# Patient Record
Sex: Female | Born: 1960
Health system: Southern US, Community
[De-identification: ages and names within clinical notes are randomized; demographics above are authoritative.]

## PROBLEM LIST (undated history)

## (undated) DIAGNOSIS — I639 Cerebral infarction, unspecified: Secondary | ICD-10-CM

## (undated) DIAGNOSIS — R251 Tremor, unspecified: Secondary | ICD-10-CM

## (undated) DIAGNOSIS — R011 Cardiac murmur, unspecified: Secondary | ICD-10-CM

## (undated) DIAGNOSIS — M1990 Unspecified osteoarthritis, unspecified site: Secondary | ICD-10-CM

## (undated) DIAGNOSIS — G8929 Other chronic pain: Secondary | ICD-10-CM

## (undated) DIAGNOSIS — I10 Essential (primary) hypertension: Secondary | ICD-10-CM

## (undated) DIAGNOSIS — I340 Nonrheumatic mitral (valve) insufficiency: Secondary | ICD-10-CM

## (undated) DIAGNOSIS — R51 Headache: Secondary | ICD-10-CM

## (undated) DIAGNOSIS — R569 Unspecified convulsions: Secondary | ICD-10-CM

## (undated) DIAGNOSIS — R519 Headache, unspecified: Secondary | ICD-10-CM

## (undated) DIAGNOSIS — R7303 Prediabetes: Secondary | ICD-10-CM

## (undated) HISTORY — DX: Cardiac murmur, unspecified: R01.1

## (undated) HISTORY — DX: Nonrheumatic mitral (valve) insufficiency: I34.0

## (undated) HISTORY — PX: POLYPECTOMY: SHX149

## (undated) HISTORY — DX: Cerebral infarction, unspecified: I63.9

## (undated) HISTORY — PX: OTHER SURGICAL HISTORY: SHX169

## (undated) HISTORY — PX: COLONOSCOPY: SHX174

---

## 1999-02-24 ENCOUNTER — Other Ambulatory Visit: Admission: RE | Admit: 1999-02-24 | Discharge: 1999-02-24 | Payer: Self-pay | Admitting: Emergency Medicine

## 1999-03-30 ENCOUNTER — Encounter: Payer: Self-pay | Admitting: Family Medicine

## 1999-03-30 ENCOUNTER — Ambulatory Visit (HOSPITAL_COMMUNITY): Admission: RE | Admit: 1999-03-30 | Discharge: 1999-03-30 | Payer: Self-pay | Admitting: Family Medicine

## 1999-09-03 ENCOUNTER — Emergency Department (HOSPITAL_COMMUNITY): Admission: EM | Admit: 1999-09-03 | Discharge: 1999-09-03 | Payer: Self-pay | Admitting: Emergency Medicine

## 1999-09-07 ENCOUNTER — Emergency Department (HOSPITAL_COMMUNITY): Admission: EM | Admit: 1999-09-07 | Discharge: 1999-09-07 | Payer: Self-pay | Admitting: Emergency Medicine

## 1999-09-07 ENCOUNTER — Encounter: Payer: Self-pay | Admitting: Emergency Medicine

## 2000-11-26 ENCOUNTER — Other Ambulatory Visit: Admission: RE | Admit: 2000-11-26 | Discharge: 2000-11-26 | Payer: Self-pay | Admitting: Family Medicine

## 2000-12-11 ENCOUNTER — Ambulatory Visit (HOSPITAL_COMMUNITY): Admission: RE | Admit: 2000-12-11 | Discharge: 2000-12-11 | Payer: Self-pay | Admitting: Family Medicine

## 2000-12-11 ENCOUNTER — Encounter: Payer: Self-pay | Admitting: Family Medicine

## 2004-04-29 ENCOUNTER — Encounter: Admission: RE | Admit: 2004-04-29 | Discharge: 2004-04-29 | Payer: Self-pay | Admitting: Family Medicine

## 2004-05-04 ENCOUNTER — Other Ambulatory Visit: Admission: RE | Admit: 2004-05-04 | Discharge: 2004-05-04 | Payer: Self-pay | Admitting: Family Medicine

## 2009-02-24 ENCOUNTER — Encounter (INDEPENDENT_AMBULATORY_CARE_PROVIDER_SITE_OTHER): Payer: Self-pay | Admitting: *Deleted

## 2009-02-24 DIAGNOSIS — I10 Essential (primary) hypertension: Secondary | ICD-10-CM | POA: Insufficient documentation

## 2009-03-17 ENCOUNTER — Encounter: Payer: Self-pay | Admitting: *Deleted

## 2009-06-09 ENCOUNTER — Encounter: Admission: RE | Admit: 2009-06-09 | Discharge: 2009-06-09 | Payer: Self-pay | Admitting: Cardiology

## 2011-12-23 ENCOUNTER — Encounter (HOSPITAL_COMMUNITY): Payer: Self-pay | Admitting: *Deleted

## 2011-12-23 ENCOUNTER — Other Ambulatory Visit: Payer: Self-pay

## 2011-12-23 ENCOUNTER — Emergency Department (HOSPITAL_COMMUNITY): Payer: Self-pay

## 2011-12-23 ENCOUNTER — Emergency Department (HOSPITAL_COMMUNITY)
Admission: EM | Admit: 2011-12-23 | Discharge: 2011-12-23 | Disposition: A | Discharge status: Home Health | Payer: Self-pay | Attending: Emergency Medicine | Admitting: Emergency Medicine

## 2011-12-23 DIAGNOSIS — R51 Headache: Secondary | ICD-10-CM | POA: Insufficient documentation

## 2011-12-23 DIAGNOSIS — I10 Essential (primary) hypertension: Secondary | ICD-10-CM

## 2011-12-23 DIAGNOSIS — E669 Obesity, unspecified: Secondary | ICD-10-CM

## 2011-12-23 HISTORY — DX: Essential (primary) hypertension: I10

## 2011-12-23 LAB — POCT I-STAT, CHEM 8
BUN: 8 mg/dL (ref 6–23)
Calcium, Ion: 1.2 mmol/L (ref 1.12–1.32)
Hemoglobin: 11.9 g/dL — ABNORMAL LOW (ref 12.0–15.0)
Sodium: 141 mEq/L (ref 135–145)
TCO2: 27 mmol/L (ref 0–100)

## 2011-12-23 MED ORDER — LISINOPRIL 20 MG PO TABS
20.0000 mg | ORAL_TABLET | Freq: Once | ORAL | Status: AC
  Administered 2011-12-23: 20 mg via ORAL
  Filled 2011-12-23: qty 1

## 2011-12-23 MED ORDER — LISINOPRIL 20 MG PO TABS: 20.0000 mg | ORAL_TABLET | Freq: Every day | ORAL | Status: DC

## 2011-12-23 NOTE — ED Provider Notes (Signed)
History     CSN: [id number]  Arrival date & time 12/23/11  1253   First MD Initiated Contact with Patient 12/23/11 1301      Chief Complaint  Patient presents with  . Headache  . Hypertension    (Consider location/radiation/quality/duration/timing/severity/associated sxs/prior treatment) HPI  Patient presents to emergency department complaining of high blood pressure and headache. Patient states that due to job loss she has been out of her blood pressure medication, losartan, for over a month now and is concerned that after filling her last 2 refills the price of her low starting was costing her approximately $75 out of pocket due to no insurance. Patient states that she woke yesterday morning with a headache that gradually worsened throughout the day and therefore she went to [facility] and have her blood pressure checked noting that her blood pressure was approximately 180/100. Patient states she took Tylenol x3 doses throughout the day with improvement of the headache yesterday but states that once again she woke this morning with headache and went to [facility] noticed her blood pressure was elevated and therefore came to emergency department for further evaluation. Patient denies associated fevers, chills, dizziness, visual changes, neck stiffness, chest pain, abdominal pain, shortness of breath, changes in her urination. Patient states she walks steps throughout the day without any exertional chest pain or shortness of breath. Patient states the headache is gradual onset but does improve after Tylenol use. She denies any loss of coordination or difficulty ambulating. She denies extremity numbness/tingling/or weakness.  Past Medical History  Diagnosis Date  . Hypertension     History reviewed. No pertinent past surgical history.  History reviewed. No pertinent family history.  History  Substance Use Topics  . Smoking status: Not on file  . Smokeless tobacco: Not on file  .  Alcohol Use: No    OB History    Grav Para Term Preterm Abortions TAB SAB Ect Mult Living                  Review of Systems  All other systems reviewed and are negative.    Allergies  Review of patient's allergies indicates no known allergies.  Home Medications  No current outpatient prescriptions on file.  BP 206/107  Pulse 87  Temp(Src) 98.1 F (36.7 C) (Oral)  Resp 20  SpO2 97%  LMP 11/24/2011  Physical Exam  Nursing note and vitals reviewed. Constitutional: She is oriented to person, place, and time. She appears well-developed and well-nourished. No distress.  HENT:  Head: Normocephalic and atraumatic.  Eyes: Conjunctivae and EOM are normal. Pupils are equal, round, and reactive to light.  Neck: Normal range of motion. Neck supple.  Cardiovascular: Normal rate, regular rhythm, normal heart sounds and intact distal pulses.  Exam reveals no gallop and no friction rub.   No murmur heard. Pulmonary/Chest: Effort normal and breath sounds normal. No respiratory distress. She has no wheezes. She has no rales. She exhibits no tenderness.  Abdominal: Soft. Bowel sounds are normal. She exhibits no distension and no mass. There is no tenderness. There is no rebound and no guarding.  Musculoskeletal: Normal range of motion. She exhibits no edema and no tenderness.  Neurological: She is alert and oriented to person, place, and time. No cranial nerve deficit. Coordination normal.  Skin: Skin is warm and dry. No rash noted. She is not diaphoretic. No erythema.  Psychiatric: She has a normal mood and affect.    ED Course  Procedures (including critical care  time)  1:41 PM PO lisinopril with most recent BP on monitor 176/98   Date: 12/23/2011  Rate: 66  Rhythm: normal sinus rhythm  QRS Axis: normal  Intervals: normal  ST/T Wave abnormalities: non specific T wave changes/flattening  Conduction Disutrbances: none  Narrative Interpretation:   Old EKG Reviewed: non  provocative EKG compared to Sep 07, 1999 No significant changes noted     Labs Reviewed - No data to display Ct Head Wo Contrast  12/23/2011  *RADIOLOGY REPORT*  Clinical Data: 51 year old female with headache and hypertension.  CT HEAD WITHOUT CONTRAST  Technique:  Contiguous axial images were obtained from the base of the skull through the vertex without contrast.  Comparison: None  Findings: No acute intracranial abnormalities are identified, including mass lesion or mass effect, hydrocephalus, extra-axial fluid collection, midline shift, hemorrhage, or acute infarction.  The visualized bony calvarium is unremarkable.  IMPRESSION: No evidence of acute intracranial abnormality.  Original Report Authenticated By: Rosendo Gros, M.D.     1. HYPERTENSION   2. Obesity       MDM  Improved BP in ER before given lisinopril with systolic below 190 after initial triage BP. Patient's HA has improved with no other neurofocal findings and no acute findings on CT scan. Patient has no other worrisome signs or symptoms of end organ damage. No acute findings on istat chem 8. We will change her BP medication to lisinopril 20mg  daily which is on [facility] 4 dollar list and give multiple referrals for PCP follow up.         Jenness [place], [place] 12/23/11 1429

## 2011-12-23 NOTE — Discharge Instructions (Signed)
Establishment with a Primary Care provider is Very important for general health care concerns, minor illness and minor injury and for ongoing evaluation and management of your high blood pressure but return to ER for emergent changing or worsening of symptoms.   Hypertension Information As your heart beats, it forces blood through your arteries. This force is your blood pressure. If the pressure is too high, it is called hypertension (HTN) or high blood pressure. HTN is dangerous because you may have it and not know it. High blood pressure may mean that your heart has to work harder to pump blood. Your arteries may be narrow or stiff. The extra work puts you at risk for heart disease, stroke, and other problems.  Blood pressure consists of two numbers, a higher number over a lower, 110/72, for example. It is stated as "110 over 72." The ideal is below 120 for the top number (systolic) and under 80 for the bottom (diastolic).  You should pay close attention to your blood pressure if you have certain conditions such as:  Heart failure.   Prior heart attack.   Diabetes   Chronic kidney disease.   Prior stroke.   Multiple risk factors for heart disease.  To see if you have HTN, your blood pressure should be measured while you are seated with your arm held at the level of the heart. It should be measured at least twice. A one-time elevated blood pressure reading (especially in the Emergency Department) does not mean that you need treatment. There may be conditions in which the blood pressure is different between your right and left arms. It is important to see your caregiver soon for a recheck. Most people have essential hypertension which means that there is not a specific cause. This type of high blood pressure may be lowered by changing lifestyle factors such as:  Stress.   Smoking.   Lack of exercise.   Excessive weight.   Drug/tobacco/alcohol use.   Eating less salt.  Most people do  not have symptoms from high blood pressure until it has caused damage to the body. Effective treatment can often prevent, delay or reduce that damage. TREATMENT  Treatment for high blood pressure, when a cause has been identified, is directed at the cause. There are a large number of medications to treat HTN. These fall into several categories, and your caregiver will help you select the medicines that are best for you. Medications may have side effects. You should review side effects with your caregiver. If your blood pressure stays high after you have made lifestyle changes or started on medicines,   Your medication(s) may need to be changed.   Other problems may need to be addressed.   Be certain you understand your prescriptions, and know how and when to take your medicine.   Be sure to follow up with your caregiver within the time frame advised (usually within two weeks) to have your blood pressure rechecked and to review your medications.   If you are taking more than one medicine to lower your blood pressure, make sure you know how and at what times they should be taken. Taking two medicines at the same time can result in blood pressure that is too low.  Document Released: 12/19/2005 Document Revised: 06/28/2011 Document Reviewed: 12/26/2007 [facility] Patient Information 2012 [place], [place].

## 2011-12-23 NOTE — ED Notes (Signed)
Pt reports being out of bp meds x 1 month, having frequent headaches, bp 224/122 at triage.

## 2011-12-24 NOTE — ED Provider Notes (Signed)
Medical screening examination/treatment/procedure(s) were performed by non-physician practitioner and as supervising physician I was immediately available for consultation/collaboration.   Jon Kasparek A. Patrica Duel, MD 12/24/11 [phone]

## 2013-07-05 DIAGNOSIS — I639 Cerebral infarction, unspecified: Secondary | ICD-10-CM

## 2013-07-05 HISTORY — DX: Cerebral infarction, unspecified: I63.9

## 2013-07-06 ENCOUNTER — Emergency Department (HOSPITAL_COMMUNITY): Payer: Self-pay

## 2013-07-06 ENCOUNTER — Inpatient Hospital Stay (HOSPITAL_COMMUNITY)
Admission: EM | Admit: 2013-07-06 | Discharge: 2013-07-21 | DRG: 023 | Disposition: A | Discharge status: Rehabilitation Facility | Payer: Self-pay | Attending: Family Medicine | Admitting: Family Medicine

## 2013-07-06 ENCOUNTER — Encounter (HOSPITAL_COMMUNITY): Payer: Self-pay | Admitting: *Deleted

## 2013-07-06 DIAGNOSIS — E785 Hyperlipidemia, unspecified: Secondary | ICD-10-CM | POA: Diagnosis present

## 2013-07-06 DIAGNOSIS — Z9119 Patient's noncompliance with other medical treatment and regimen: Secondary | ICD-10-CM

## 2013-07-06 DIAGNOSIS — Z6841 Body Mass Index (BMI) 40.0 and over, adult: Secondary | ICD-10-CM

## 2013-07-06 DIAGNOSIS — E876 Hypokalemia: Secondary | ICD-10-CM | POA: Diagnosis not present

## 2013-07-06 DIAGNOSIS — I619 Nontraumatic intracerebral hemorrhage, unspecified: Principal | ICD-10-CM | POA: Diagnosis present

## 2013-07-06 DIAGNOSIS — G919 Hydrocephalus, unspecified: Secondary | ICD-10-CM

## 2013-07-06 DIAGNOSIS — R4182 Altered mental status, unspecified: Secondary | ICD-10-CM | POA: Diagnosis present

## 2013-07-06 DIAGNOSIS — E669 Obesity, unspecified: Secondary | ICD-10-CM | POA: Diagnosis present

## 2013-07-06 DIAGNOSIS — I161 Hypertensive emergency: Secondary | ICD-10-CM | POA: Diagnosis present

## 2013-07-06 DIAGNOSIS — N170 Acute kidney failure with tubular necrosis: Secondary | ICD-10-CM | POA: Diagnosis not present

## 2013-07-06 DIAGNOSIS — G911 Obstructive hydrocephalus: Secondary | ICD-10-CM | POA: Diagnosis present

## 2013-07-06 DIAGNOSIS — E870 Hyperosmolality and hypernatremia: Secondary | ICD-10-CM | POA: Diagnosis not present

## 2013-07-06 DIAGNOSIS — I10 Essential (primary) hypertension: Secondary | ICD-10-CM | POA: Diagnosis present

## 2013-07-06 DIAGNOSIS — G936 Cerebral edema: Secondary | ICD-10-CM | POA: Diagnosis present

## 2013-07-06 DIAGNOSIS — Z91199 Patient's noncompliance with other medical treatment and regimen due to unspecified reason: Secondary | ICD-10-CM

## 2013-07-06 LAB — LIPID PANEL
HDL: 45 mg/dL (ref >39)
LDL Cholesterol: 168 mg/dL — ABNORMAL HIGH (ref 0–99)
Total CHOL/HDL Ratio: 5.4 RATIO
Triglycerides: 153 mg/dL — ABNORMAL HIGH (ref <150)
VLDL: 31 mg/dL (ref 0–40)

## 2013-07-06 LAB — BASIC METABOLIC PANEL
CO2: 26 mEq/L (ref 19–32)
CO2: 23 mEq/L (ref 19–32)
Calcium: 8.9 mg/dL (ref 8.4–10.5)
Calcium: 9.4 mg/dL (ref 8.4–10.5)
Chloride: 101 mEq/L (ref 96–112)
Chloride: 97 mEq/L (ref 96–112)
Creatinine, Ser: 0.69 mg/dL (ref 0.50–1.10)
Creatinine, Ser: 0.61 mg/dL (ref 0.50–1.10)
GFR calc Af Amer: >90 mL/min (ref >90)
GFR calc Af Amer: >90 mL/min (ref >90)
Potassium: 3.4 mEq/L — ABNORMAL LOW (ref 3.5–5.1)
Sodium: 138 mEq/L (ref 135–145)

## 2013-07-06 LAB — URINALYSIS, ROUTINE W REFLEX MICROSCOPIC
Bilirubin Urine: NEGATIVE
Ketones, ur: NEGATIVE mg/dL
Nitrite: NEGATIVE
Protein, ur: NEGATIVE mg/dL
pH: 8 (ref 5.0–8.0)

## 2013-07-06 LAB — CBC
MCH: 28.3 pg (ref 26.0–34.0)
MCV: 83.8 fL (ref 78.0–100.0)
Platelets: 215 10*3/uL (ref 150–400)
RBC: 4.63 MIL/uL (ref 3.87–5.11)
RDW: 15.6 % — ABNORMAL HIGH (ref 11.5–15.5)
WBC: 9.1 10*3/uL (ref 4.0–10.5)

## 2013-07-06 LAB — PROTIME-INR
INR: 0.95 (ref 0.00–1.49)
Prothrombin Time: 12.5 seconds (ref 11.6–15.2)

## 2013-07-06 MED ORDER — POTASSIUM CHLORIDE 10 MEQ/100ML IV SOLN
10.0000 meq | INTRAVENOUS | Status: AC
  Administered 2013-07-06 (×4): 10 meq via INTRAVENOUS
  Filled 2013-07-06 (×4): qty 100

## 2013-07-06 MED ORDER — CEFAZOLIN SODIUM-DEXTROSE 2-3 GM-% IV SOLR
2.0000 g | Freq: Three times a day (TID) | INTRAVENOUS | Status: DC
  Administered 2013-07-06 – 2013-07-11 (×17): 2 g via INTRAVENOUS
  Filled 2013-07-06 (×18): qty 50

## 2013-07-06 MED ORDER — ACETAMINOPHEN 650 MG RE SUPP
650.0000 mg | RECTAL | Status: DC | PRN
  Administered 2013-07-06 (×2): 650 mg via RECTAL
  Filled 2013-07-06 (×2): qty 1

## 2013-07-06 MED ORDER — ONDANSETRON HCL 4 MG/2ML IJ SOLN
4.0000 mg | Freq: Once | INTRAMUSCULAR | Status: AC
  Administered 2013-07-06: 4 mg via INTRAVENOUS
  Filled 2013-07-06: qty 2

## 2013-07-06 MED ORDER — LORAZEPAM 2 MG/ML IJ SOLN
1.0000 mg | INTRAMUSCULAR | Status: DC | PRN
  Administered 2013-07-06 – 2013-07-08 (×8): 1 mg via INTRAVENOUS
  Filled 2013-07-06 (×7): qty 1

## 2013-07-06 MED ORDER — ONDANSETRON HCL 4 MG/2ML IJ SOLN
4.0000 mg | Freq: Four times a day (QID) | INTRAMUSCULAR | Status: DC | PRN
  Administered 2013-07-06 – 2013-07-14 (×3): 4 mg via INTRAVENOUS
  Filled 2013-07-06 (×3): qty 2

## 2013-07-06 MED ORDER — FENTANYL CITRATE 0.05 MG/ML IJ SOLN
50.0000 ug | Freq: Once | INTRAMUSCULAR | Status: AC
  Administered 2013-07-06: 50 ug via INTRAVENOUS

## 2013-07-06 MED ORDER — MIDAZOLAM HCL 2 MG/2ML IJ SOLN
1.0000 mg | Freq: Once | INTRAMUSCULAR | Status: AC
  Administered 2013-07-06: 1 mg via INTRAVENOUS

## 2013-07-06 MED ORDER — ACETAMINOPHEN 325 MG PO TABS
650.0000 mg | ORAL_TABLET | ORAL | Status: DC | PRN
  Administered 2013-07-14 – 2013-07-21 (×11): 650 mg via ORAL
  Filled 2013-07-06 (×12): qty 2

## 2013-07-06 MED ORDER — MORPHINE SULFATE 2 MG/ML IJ SOLN
2.0000 mg | Freq: Once | INTRAMUSCULAR | Status: AC
  Administered 2013-07-06: 2 mg via INTRAVENOUS
  Filled 2013-07-06: qty 1

## 2013-07-06 MED ORDER — VALPROATE SODIUM 500 MG/5ML IV SOLN
500.0000 mg | Freq: Three times a day (TID) | INTRAVENOUS | Status: DC
  Administered 2013-07-06 – 2013-07-11 (×14): 500 mg via INTRAVENOUS
  Filled 2013-07-06 (×16): qty 5

## 2013-07-06 MED ORDER — FENTANYL CITRATE 0.05 MG/ML IJ SOLN
INTRAMUSCULAR | Status: AC
  Filled 2013-07-06: qty 2

## 2013-07-06 MED ORDER — LORAZEPAM 2 MG/ML IJ SOLN
INTRAMUSCULAR | Status: AC
  Filled 2013-07-06: qty 1

## 2013-07-06 MED ORDER — MIDAZOLAM HCL 2 MG/2ML IJ SOLN
1.0000 mg | Freq: Once | INTRAMUSCULAR | Status: AC
  Administered 2013-07-06: 1 mg via INTRAVENOUS
  Filled 2013-07-06: qty 2

## 2013-07-06 MED ORDER — SENNOSIDES-DOCUSATE SODIUM 8.6-50 MG PO TABS
1.0000 | ORAL_TABLET | Freq: Two times a day (BID) | ORAL | Status: DC
  Administered 2013-07-09 – 2013-07-21 (×21): 1 via ORAL
  Filled 2013-07-06 (×30): qty 1

## 2013-07-06 MED ORDER — VALPROATE SODIUM 500 MG/5ML IV SOLN
1000.0000 mg | Freq: Once | INTRAVENOUS | Status: AC
  Administered 2013-07-06: 1000 mg via INTRAVENOUS
  Filled 2013-07-06: qty 10

## 2013-07-06 MED ORDER — NICARDIPINE HCL IN NACL 20-0.86 MG/200ML-% IV SOLN
5.0000 mg/h | INTRAVENOUS | Status: DC
  Administered 2013-07-06: 6 mg/h via INTRAVENOUS
  Administered 2013-07-06: 5 mg/h via INTRAVENOUS
  Administered 2013-07-06: 7.5 mg/h via INTRAVENOUS
  Administered 2013-07-06: 5 mg/h via INTRAVENOUS
  Administered 2013-07-06 (×2): 10 mg/h via INTRAVENOUS
  Administered 2013-07-06: 4 mg/h via INTRAVENOUS
  Administered 2013-07-06: 14 mg/h via INTRAVENOUS
  Administered 2013-07-06: 12 mg/h via INTRAVENOUS
  Administered 2013-07-07: 12.5 mg/h via INTRAVENOUS
  Administered 2013-07-07: 2.5 mg/h via INTRAVENOUS
  Administered 2013-07-07: 10 mg/h via INTRAVENOUS
  Administered 2013-07-07: 5 mg/h via INTRAVENOUS
  Filled 2013-07-06 (×10): qty 200

## 2013-07-06 MED ORDER — PANTOPRAZOLE SODIUM 40 MG IV SOLR
40.0000 mg | Freq: Every day | INTRAVENOUS | Status: DC
  Administered 2013-07-06 – 2013-07-09 (×4): 40 mg via INTRAVENOUS
  Filled 2013-07-06 (×6): qty 40

## 2013-07-06 MED ORDER — NICARDIPINE HCL IN NACL 20-0.86 MG/200ML-% IV SOLN
5.0000 mg/h | Freq: Once | INTRAVENOUS | Status: AC
  Administered 2013-07-06: 5 mg/h via INTRAVENOUS
  Filled 2013-07-06: qty 200

## 2013-07-06 MED ORDER — LABETALOL HCL 5 MG/ML IV SOLN: 10.0000 mg | INTRAVENOUS | Status: DC | PRN

## 2013-07-06 MED ORDER — MORPHINE SULFATE 4 MG/ML IJ SOLN
4.0000 mg | Freq: Once | INTRAMUSCULAR | Status: AC
  Administered 2013-07-06: 4 mg via INTRAVENOUS
  Filled 2013-07-06: qty 1

## 2013-07-06 MED ORDER — LABETALOL HCL 5 MG/ML IV SOLN
20.0000 mg | Freq: Once | INTRAVENOUS | Status: AC
  Administered 2013-07-06: 20 mg via INTRAVENOUS
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
## 2013-07-06 NOTE — Progress Notes (Signed)
Dr Lovell Sheehan at bedside to flush IVC drain. Sterile saline given, pt tolerated procedure well. Dr Lovell Sheehan stated that drain will likely clot again and to monitor. IVC pulsating when he left but no new drainage. Pt with some confusion, forgetfulness and agitation. Dr Lovell Sheehan stated to wait until tomorrow to attempt MRI due to pt LOC, agitation and IVC. Will continue to monitor.
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
## 2013-07-06 NOTE — Progress Notes (Signed)
Chaplain was asked to speak with the patients family while in the ED. Chaplain provided a ministry of presence to the patients daughters and sisters. Chaplain provided a listenin  07/06/13 0230  Clinical Encounter Type  Visited With Family  Visit Type Initial;Spiritual support;Social support;ED  Referral From Physician  Spiritual Encounters  Spiritual Needs Emotional  Stress Factors  Patient Stress Factors None identified  Family Stress Factors Health changes;Major life changes  g and emphatic ear as the family described how the patient was fine the day before, but started experiencing headaches the following day. Chaplain offered prayer and emotional support.

## 2013-07-06 NOTE — Progress Notes (Signed)
*  PRELIMINARY RESULTS* Vascular Ultrasound Carotid Duplex (Doppler) has been completed.   Study was technically limited due to poor patient cooperation, and high bifurcation. Findings suggest 1-39% internal carotid artery stenosis bilaterally. Vertebral arteries are patent with antegrade flow.  07/06/2013 10:13 AM Gertie Fey, RVT, RDCS, RDMS

## 2013-07-06 NOTE — Op Note (Signed)
Brief history: The patient is a 52 year old black female with a history of hypertension who presented with decreased mental status. A head CT demonstrated a right intracerebral hemorrhage with interventricular hemorrhage and hydrocephalus. I discussed situation with the patient's family. Recommend placement of a ventriculostomy. I explained the procedure and we discussed the risks, benefits, alternatives, and likelihood of achieving our goals with surgery. I've answered all her questions. They have consented on behalf of the patient for placement of a ventriculostomy.  Preop diagnosis: Right intracerebral hemorrhage, intraventricular hemorrhage, hydrocephalus  Postop diagnosis: The same  Procedure: Placement of right frontal ventriculostomy via a burr hole  Surgeon: Dr. Delma Officer  Asst.: None  Anesthesia: Local  Estimated blood loss: Minimal  Specimens: None  Complications: None  Description of procedure: I shaved the patient's right scalp with clippers. This area was prepared with DuraPrep. Sterile drapes were applied. I then injected the area to be incised with Marcaine with epinephrine solution. I uses scalpel make a right frontal incision approximately at the mid pupillary line. We used a self-retaining retractor for exposure. I used the eggbeater drill to create right frontal burr hole. I then incised the underlying dura. I then cannulated the patient's ventricle with a ventriculostomy. We got good flow of bloody CSF. I then tunneled a ventriculostomy under the scalp. I connected the ventriculostomy to the drainage system. I then removed the retractor and reapproximated patient's scalp incision with a running 3-0 nylon suture. A sterile dressing was applied. The drapes were removed. The patient tolerated procedure well without change in her neurologic status.

## 2013-07-06 NOTE — ED Notes (Signed)
CBG was 156  

## 2013-07-06 NOTE — Progress Notes (Signed)
Received patient from ER awake , alert and oriented  X 4. IVC filter intact but not draining, Dr Lovell Sheehan was made aware. Dressing dry and intact with a small stain mark.  Foley catheter  draining clear yellow urine. CHG bath and MRSA PCR collected. IVC drain levelled and patient settled comfortable to bed. Call bell in close reach.

## 2013-07-06 NOTE — Progress Notes (Signed)
Dr Wynetta Emery notified that IVC drain has stopped pulsating, Pt still clinically the same with no neuro changes. Dr Wynetta Emery stated to notify only if patient LOC declines. Pt resting comfortably will continue to monitor.

## 2013-07-06 NOTE — Progress Notes (Signed)
  Echocardiogram 2D Echocardiogram has been performed.  Alison Ward 07/06/2013, 10:27 AM

## 2013-07-06 NOTE — ED Provider Notes (Signed)
CSN: [id number]     Arrival date & time 07/06/13  0014 History   First MD Initiated Contact with Patient 07/06/13 0016     Chief Complaint  Patient presents with  . Headache  . Hypertension   (Consider location/radiation/quality/duration/timing/severity/associated sxs/prior Treatment) HPI Alison Ward is a 52 yo woman with HTN and a history of frequent headaches. She is BIB EMS after having been found in her car, outside of a residence in an apparently confused state of mind. Her daughters are here and state that the patient called them about 2 hrs PTA because she was confused, driving and did not know where she was or how she could get home. She had come from a get together at her boss's house.  She is well familiar with the route to and from her boss's house.   The patient has no history of similar episodes of confusion .   She has been persistently complaining of headache, per her dtrs. She seems confused and disoriented. No history of substance use or abuse.   Hx obtained from daughters.   Past Medical History  Diagnosis Date  . Hypertension    No past surgical history on file. History reviewed. No pertinent family history. History  Substance Use Topics  . Smoking status: Not on file  . Smokeless tobacco: Not on file  . Alcohol Use: No   OB History   Grav Para Term Preterm Abortions TAB SAB Ect Mult Living                 Review of Systems Unable to obtain ROS from the patient due to AMS  Allergies  Review of patient's allergies indicates no known allergies.  Home Medications   Current Outpatient Rx  Name  Route  Sig  Dispense  Refill  . EXPIRED: lisinopril (PRINIVIL,ZESTRIL) 20 MG tablet   Oral   Take 1 tablet (20 mg total) by mouth daily.   30 tablet   1    BP 202/98  Pulse 65  Temp(Src) 97.5 F (36.4 C) (Oral)  Resp 18  SpO2 100% Physical Exam Gen: well developed and well nourished appearing, appears uncomfortable, moaning in bed Head: NCAT Eyes: PERL,  EOMI Nose: no epistaixis or rhinorrhea Mouth/throat: mucosa is moist and pink Neck: supple, no stridor Lungs: CTA B, no wheezing, rhonchi or rales CV: Regular rate and rhythm, no murmur, extremities well perfused Abd: soft, notender, nondistended Back: no ttp, no cva ttp Skin: no rashese, wnl Neuro:  Patient has mildly slurred speech, she is mildly agitated, she repeatedly complaints of headache, moves all 4 extremities but does not followed commands appropriately.  Psyche; agitated affect  ED Course  Procedures (including critical care time)  Results for orders placed during the hospital encounter of 07/06/13 (from the past 72 hour(s))  GLUCOSE, CAPILLARY     Status: Abnormal   Collection Time    07/06/13 12:57 AM      Result Value Range   Glucose-Capillary 156 (*) 70 - 99 mg/dL   Comment 1 MRN [id number]    POCT I-STAT TROPONIN I     Status: None   Collection Time    07/06/13  2:00 AM      Result Value Range   Troponin i, poc 0.00  0.00 - 0.08 ng/mL   Comment 3            Comment: Due to the release kinetics of cTnI,     a negative result within the first hours  of the onset of symptoms does not rule out     myocardial infarction with certainty.     If myocardial infarction is still suspected,     repeat the test at appropriate intervals.  CBC     Status: Abnormal   Collection Time    07/06/13  2:10 AM      Result Value Range   WBC 9.1  4.0 - 10.5 K/uL   RBC 4.63  3.87 - 5.11 MIL/uL   Hemoglobin 13.1  12.0 - 15.0 g/dL   HCT 16.1  09.6 - 04.5 %   MCV 83.8  78.0 - 100.0 fL   MCH 28.3  26.0 - 34.0 pg   MCHC 33.8  30.0 - 36.0 g/dL   RDW 40.9 (*) 81.1 - 91.4 %   Platelets 215  150 - 400 K/uL  PROTIME-INR     Status: None   Collection Time    07/06/13  2:10 AM      Result Value Range   Prothrombin Time 12.5  11.6 - 15.2 seconds   INR 0.95  0.00 - 1.49  APTT     Status: None   Collection Time    07/06/13  2:10 AM      Result Value Range   aPTT 27  24 - 37 seconds   BASIC METABOLIC PANEL     Status: Abnormal   Collection Time    07/06/13  2:11 AM      Result Value Range   Sodium 138  135 - 145 mEq/L   Potassium 2.9 (*) 3.5 - 5.1 mEq/L   Chloride 101  96 - 112 mEq/L   CO2 26  19 - 32 mEq/L   Glucose, Bld 157 (*) 70 - 99 mg/dL   BUN 11  6 - 23 mg/dL   Creatinine, Ser 7.82  0.50 - 1.10 mg/dL   Calcium 8.9  8.4 - 95.6 mg/dL   GFR calc non Af Amer >90  >90 mL/min   GFR calc Af Amer >90  >90 mL/min   Comment: (NOTE)     The eGFR has been calculated using the CKD EPI equation.     This calculation has not been validated in all clinical situations.     eGFR's persistently <90 mL/min signify possible Chronic Kidney     Disease.  URINALYSIS, ROUTINE W REFLEX MICROSCOPIC     Status: Abnormal   Collection Time    07/06/13  3:59 AM      Result Value Range   Color, Urine YELLOW  YELLOW   APPearance CLEAR  CLEAR   Specific Gravity, Urine 1.009  1.005 - 1.030   pH 8.0  5.0 - 8.0   Glucose, UA 100 (*) NEGATIVE mg/dL   Hgb urine dipstick NEGATIVE  NEGATIVE   Bilirubin Urine NEGATIVE  NEGATIVE   Ketones, ur NEGATIVE  NEGATIVE mg/dL   Protein, ur NEGATIVE  NEGATIVE mg/dL   Urobilinogen, UA 0.2  0.0 - 1.0 mg/dL   Nitrite NEGATIVE  NEGATIVE   Leukocytes, UA NEGATIVE  NEGATIVE   Comment: MICROSCOPIC NOT DONE ON URINES WITH NEGATIVE PROTEIN, BLOOD, LEUKOCYTES, NITRITE, OR GLUCOSE <1000 mg/dL.  HEMOGLOBIN A1C     Status: Abnormal   Collection Time    07/06/13  4:20 AM      Result Value Range   Hemoglobin A1C 6.0 (*) <5.7 %   Comment: (NOTE)  According to the ADA Clinical Practice Recommendations for 2011, when     HbA1c is used as a screening test:      >=6.5%   Diagnostic of Diabetes Mellitus               (if abnormal result is confirmed)     5.7-6.4%   Increased risk of developing Diabetes Mellitus     References:Diagnosis and Classification of Diabetes Mellitus,Diabetes      Care,2011,34(Suppl 1):S62-S69 and Standards of Medical Care in             Diabetes - 2011,Diabetes Care,2011,34 (Suppl 1):S11-S61.   Mean Plasma Glucose 126 (*) <117 mg/dL   Comment: Performed at [facility]  LIPID PANEL     Status: Abnormal   Collection Time    07/06/13  4:20 AM      Result Value Range   Cholesterol 244 (*) 0 - 200 mg/dL   Triglycerides 811 (*) <150 mg/dL   HDL 45  >91 mg/dL   Total CHOL/HDL Ratio 5.4     VLDL 31  0 - 40 mg/dL   LDL Cholesterol 478 (*) 0 - 99 mg/dL   Comment:            Total Cholesterol/HDL:CHD Risk     Coronary Heart Disease Risk Table                         Men   Women      1/2 Average Risk   3.4   3.3      Average Risk       5.0   4.4      2 X Average Risk   9.6   7.1      3 X Average Risk  23.4   11.0                Use the calculated Patient Ratio     above and the CHD Risk Table     to determine the patient's CHD Risk.                ATP III CLASSIFICATION (LDL):      <100     mg/dL   Optimal      295-621  mg/dL   Near or Above                        Optimal      130-159  mg/dL   Borderline      308-657  mg/dL   High      >846     mg/dL   Very High  MRSA PCR SCREENING     Status: None   Collection Time    07/06/13  7:03 AM      Result Value Range   MRSA by PCR NEGATIVE  NEGATIVE   Comment:            The GeneXpert MRSA Assay (FDA     approved for NASAL specimens     only), is one component of a     comprehensive MRSA colonization     surveillance program. It is not     intended to diagnose MRSA     infection nor to guide or     monitor treatment for     MRSA infections.  BASIC METABOLIC PANEL     Status: Abnormal   Collection Time    07/06/13  3:30 PM      Result Value Range   Sodium 137  135 - 145 mEq/L   Potassium 3.4 (*) 3.5 - 5.1 mEq/L   Chloride 97  96 - 112 mEq/L   CO2 23  19 - 32 mEq/L   Glucose, Bld 125 (*) 70 - 99 mg/dL   BUN 10  6 - 23 mg/dL   Creatinine, Ser 1.61  0.50 - 1.10 mg/dL   Calcium  9.4  8.4 - 09.6 mg/dL   GFR calc non Af Amer >90  >90 mL/min   GFR calc Af Amer >90  >90 mL/min   Comment: (NOTE)     The eGFR has been calculated using the CKD EPI equation.     This calculation has not been validated in all clinical situations.     eGFR's persistently <90 mL/min signify possible Chronic Kidney     Disease.  CBC     Status: Abnormal   Collection Time    07/07/13  3:45 AM      Result Value Range   WBC 14.9 (*) 4.0 - 10.5 K/uL   RBC 4.75  3.87 - 5.11 MIL/uL   Hemoglobin 13.3  12.0 - 15.0 g/dL   HCT 04.5  40.9 - 81.1 %   MCV 82.9  78.0 - 100.0 fL   MCH 28.0  26.0 - 34.0 pg   MCHC 33.8  30.0 - 36.0 g/dL   RDW 91.4 (*) 78.2 - 95.6 %   Platelets 217  150 - 400 K/uL  BASIC METABOLIC PANEL     Status: Abnormal   Collection Time    07/07/13  3:45 AM      Result Value Range   Sodium 136  135 - 145 mEq/L   Potassium 3.4 (*) 3.5 - 5.1 mEq/L   Chloride 99  96 - 112 mEq/L   CO2 25  19 - 32 mEq/L   Glucose, Bld 112 (*) 70 - 99 mg/dL   BUN 12  6 - 23 mg/dL   Creatinine, Ser 2.13  0.50 - 1.10 mg/dL   Calcium 9.3  8.4 - 08.6 mg/dL   GFR calc non Af Amer >90  >90 mL/min   GFR calc Af Amer >90  >90 mL/min   Comment: (NOTE)     The eGFR has been calculated using the CKD EPI equation.     This calculation has not been validated in all clinical situations.     eGFR's persistently <90 mL/min signify possible Chronic Kidney     Disease.  SODIUM     Status: Abnormal   Collection Time    07/07/13 11:10 AM      Result Value Range   Sodium 134 (*) 135 - 145 mEq/L   Comment: SLIGHT HEMOLYSIS  SODIUM     Status: None   Collection Time    07/07/13  2:00 PM      Result Value Range   Sodium 139  135 - 145 mEq/L  SODIUM     Status: None   Collection Time    07/07/13  8:00 PM      Result Value Range   Sodium 142  135 - 145 mEq/L  SODIUM     Status: Abnormal   Collection Time    07/08/13  2:48 AM      Result Value Range   Sodium 146 (*) 135 - 145 mEq/L  CT HEAD WITHOUT  CONTRAST  Technique: Contiguous axial images were obtained from the base  of the skull through the vertex without contrast.  Comparison: 12/23/2011  Findings: Dilatation of the lateral ventricles. Large intracerebral hematoma identified at the right posterior parietotemporal region, 4.4 x 3.1 cm image 17. Evidence of intraventricular extension of hemorrhage into the right lateral ventricle, third ventricle and minimally into frontal horn of the left lateral ventricle. Edema identified surrounding the intraparenchymal hemorrhage in the right hemisphere. Question small amount of subarachnoid hemorrhage at the high posterior right parietal region.  Mild diffuse effacement of sulci. 6 mm of right-to-left midline shift. No additional areas of hemorrhage, mass or acute infarction. Scattered falx calcifications. Bones and sinuses unremarkable.  IMPRESSION: Large intracerebral hematoma at the posterior right parietotemporal lobe, 4.1 x 3.1 cm in size with surrounding edema and evidence of intraventricular extension of hemorrhage. Question minimal subarachnoid blood at high posterior right parietal region. 6 mm of right-to-left midline shift and diffuse sulcal effacement noted.  Critical Value/emergent results were called by telephone at the time of interpretation on 07/06/2013 at 0125 hours to Dr. Lavella Lemons, who verbally acknowledged these results.   Original Report Authenticated By: Ulyses Southward, M.D.    MDM   Patient with hypertensive ICH with AMS. NSU consulted and promptly presented to place ventric. CC service consulted and, together with NSU, the decision was made to hold off on intubation as the patient's MS was fluctuating but, she seemed able to protect her airway.   Patient started in nicardepine infusion which was titrated up for optimal BP management. Coags wnl.   Patient admitted to ICU.   CRITICAL CARE Performed by: Brandt Loosen   Total critical care time:  24m  Critical care time was exclusive of separately billable procedures and treating other patients.  Critical care was necessary to treat or prevent imminent or life-threatening deterioration.  Critical care was time spent personally by me on the following activities: development of treatment plan with patient and/or surrogate as well as nursing, discussions with consultants, evaluation of patient's response to treatment, examination of patient, obtaining history from patient or surrogate, ordering and performing treatments and interventions, ordering and review of laboratory studies, ordering and review of radiographic studies, pulse oximetry and re-evaluation of patient's condition.    Brandt Loosen, MD 07/08/13 [phone]

## 2013-07-06 NOTE — Progress Notes (Signed)
Stroke Team Progress Note  HISTORY Alison Ward is an 52 y.o. female with a history of hypertension and poor compliance with taking medication, presenting with confusion and headache as well as lethargy. Patient was confused and called family members to get directions home. She was noted to be outside of the home but unknown resident. A pedestrian called EMS. Emergency services subsequently located family members. Blood pressure on arrival in the emergency room was 202/98. CT scan of her head showed a 4.1 x 3.1 cm parietotemporal intracerebral infarction with surrounding edema and intraventricular extension of hemorrhage. Patient had a 6 mm right to left midline shift. There is no previous history of stroke nor TIA. Patient has not been on antiplatelet therapy. NIH stroke score was 20.  LSN: Unclear  tPA Given: No: ICH  MRankin: 4   Patient was not a TPA candidate secondary to ICH  She was admitted to the neuro ICU for further evaluation and treatment.  SUBJECTIVE Her daughter and son are  at the bedside.  Overall she feels her condition is unchanged. The family informed me that she has been noncompliant with her blood pressure medicines and was taking them on the has needed and not on a regular basis. She had significant blood pressure elevation in the ER and was placed on  Cardene drip .Blood pressure has since been adequately controlled. She had decreased mental status and had ventriculostomy catheter placed by Dr. Lovell Sheehan. There was waveform obtained and drainage initially but this stopped overnight.  OBJECTIVE Most recent Vital Signs: Filed Vitals:   07/06/13 1200 07/06/13 1215 07/06/13 1230 07/06/13 1245  BP: 154/78 149/77 146/71 149/72  Pulse: 90 100 90 89  Temp:      TempSrc:      Resp: 24 24 26 26   Height:      Weight:      SpO2: 96% 98% 97% 97%   CBG (last 3)  No results found for this basename: GLUCAP,  in the last 72 hours  IV Fluid Intake:   . niCARDipine 4 mg/hr  (07/06/13 1234)    MEDICATIONS  .  ceFAZolin (ANCEF) IV  2 g Intravenous Q8H  . pantoprazole (PROTONIX) IV  40 mg Intravenous QHS  . potassium chloride  10 mEq Intravenous Q1 Hr x 4  . senna-docusate  1 tablet Oral BID  . valproic acid (DEPACON) IVPB  1,000 mg Intravenous Once  . valproate sodium  500 mg Intravenous Q8H   PRN:  acetaminophen, acetaminophen  Diet:    n.p.o. Activity:  Bedrest*  DVT Prophylaxis:  SCDs CLINICALLY SIGNIFICANT STUDIES Basic Metabolic Panel:  Recent Labs Lab 07/06/13 0211  NA 138  K 2.9*  CL 101  CO2 26  GLUCOSE 157*  BUN 11  CREATININE 0.69  CALCIUM 8.9   Liver Function Tests: No results found for this basename: AST, ALT, ALKPHOS, BILITOT, PROT, ALBUMIN,  in the last 168 hours CBC:  Recent Labs Lab 07/06/13 0210  WBC 9.1  HGB 13.1  HCT 38.8  MCV 83.8  PLT 215   Coagulation:  Recent Labs Lab 07/06/13 0210  LABPROT 12.5  INR 0.95   Cardiac Enzymes: No results found for this basename: CKTOTAL, CKMB, CKMBINDEX, TROPONINI,  in the last 168 hours Urinalysis:  Recent Labs Lab 07/06/13 0359  COLORURINE YELLOW  LABSPEC 1.009  PHURINE 8.0  GLUCOSEU 100*  HGBUR NEGATIVE  BILIRUBINUR NEGATIVE  KETONESUR NEGATIVE  PROTEINUR NEGATIVE  UROBILINOGEN 0.2  NITRITE NEGATIVE  LEUKOCYTESUR NEGATIVE  Lipid Panel    Component Value Date/Time   CHOL 244* 07/06/2013 0420   TRIG 153* 07/06/2013 0420   HDL 45 07/06/2013 0420   CHOLHDL 5.4 07/06/2013 0420   VLDL 31 07/06/2013 0420   LDLCALC 168* 07/06/2013 0420   HgbA1C  No results found for this basename: HGBA1C    Urine Drug Screen:   No results found for this basename: labopia, cocainscrnur, labbenz, amphetmu, thcu, labbarb    Alcohol Level: No results found for this basename: ETH,  in the last 168 hours  Ct Head Wo Contrast  07/06/2013   *RADIOLOGY REPORT*  Clinical Data:  Hypertension, severe headache  CT HEAD WITHOUT CONTRAST  Technique:  Contiguous axial images were obtained from the  base of the skull through the vertex without contrast.  Comparison: 12/23/2011  Findings: Dilatation of the lateral ventricles. Large intracerebral hematoma identified at the right posterior parietotemporal region, 4.4 x 3.1 cm image 17. Evidence of intraventricular extension of hemorrhage into the right lateral ventricle, third ventricle and minimally into frontal horn of the left lateral ventricle. Edema identified surrounding the intraparenchymal hemorrhage in the right hemisphere. Question small amount of subarachnoid hemorrhage at the high posterior right parietal region.  Mild diffuse effacement of sulci. 6 mm of right-to-left midline shift. No additional areas of hemorrhage, mass or acute infarction. Scattered falx calcifications. Bones and sinuses unremarkable.  IMPRESSION: Large intracerebral hematoma at the posterior right parietotemporal lobe, 4.1 x 3.1 cm in size with surrounding edema and evidence of intraventricular extension of hemorrhage. Question minimal subarachnoid blood at high posterior right parietal region. 6 mm of right-to-left midline shift and diffuse sulcal effacement noted.  Critical Value/emergent results were called by telephone at the time of interpretation on 07/06/2013 at 0125 hours to Dr. Lavella Lemons, who verbally acknowledged these results.   Original Report Authenticated By: Ulyses Southward, M.D.   Dg Chest Port 1 View  07/06/2013   *RADIOLOGY REPORT*  Clinical Data: Headaches.  Concern for bleed.  PORTABLE CHEST - 1 VIEW  Comparison: 06/09/2009  Findings: Shallow inspiration.  Mild cardiac enlargement. Pulmonary vascularity is normal for technique.  No focal consolidation or airspace disease.  No blunting of costophrenic angles.  No pneumothorax.  No significant change since previous study, allowing for technical differences.  IMPRESSION: Shallow inspiration.  Mild cardiac enlargement.  No evidence of active pulmonary disease.   Original Report Authenticated By: Burman Nieves, M.D.     MRI of the brain  pending  MRA of the brain  pending  2D Echocardiogram  pending  Carotid Doppler  Not ordered  CXR 07/06/13 Shallow inspiration. Mild cardiac enlargement. No evidence of  active pulmonary disease.    EKG Sinus rhythm Probable LVH with secondary repol abnrm   Therapy Recommendations pending Physical Exam   Obese middle aged african american lady not in distress.Awake alert. Afebrile. Head is nontraumatic. Neck is supple without bruit. Hearing is normal. Cardiac exam no murmur or gallop. Lungs are clear to auscultation. Distal pulses are well felt. She has a right frontal ventriculostomy catheter Neurological Exam :  Drowsy but can be aroused easily. No gaze deviation. Pupils equal reactive. Decrease blink to threat on the left. Disoriented to time and place. Diminished attention, restriction and recall. Speech is clear. Follows commands well. Mild left lower facial weakness. Motor system exam reveals no upper or lower rectum to drift but patient's cooperation is limited. I suspect mild left grip and hip flexor and ankle dorsiflexor weakness. Reflexes are symmetric. Left plantar  is equivocal and right is downgoing. Gait was not tested. ASSESSMENT Alison Ward is a 52 y.o. female presenting with  Headache, confusion and vision changes secondary to large right pareital parenchymal hemorrhage with intraventricular extension mild cytotoxic edema and hydrocephalous-etiology likely hypertensive with accelerated hypertension.        Hospital day # 0  TREATMENT/PLAN Continue strict control of hypertension with blood pressure goal below 160 for 24 hours followed by below 180 later. Continue Cardene drip for now.  IV Depacon 1 g now followed by 500 mg Q8 hourly for headache.  Physical, occupational and speech therapy consults.  CT angiogram of the brain in the morning. Long discussion with daughter about prognosis, plan for evaluation, treatment and answered  questions to  I discussed with Dr. Lovell Sheehan the patient's nonfunctional ventriculostomy. As long as she is not neurologically getting worse will wait till tomorrow and if there is no significant worsening of hydrocephalus made discontinue the ventriculostomy.   This patient is critically ill and at significant risk of neurological worsening, death and care requires constant monitoring of vital signs, hemodynamics,respiratory and cardiac monitoring,review of multiple databases, neurological assessment, discussion with family, other specialists and medical decision making of high complexity. I spent 30 minutes of neurocritical care time  in the care of  this patient. Delia Heady, MD 07/06/2013 12:51 PM

## 2013-07-06 NOTE — ED Notes (Signed)
Dr. Lovell Sheehan at Bedside

## 2013-07-06 NOTE — Progress Notes (Signed)
Dr Lovell Sheehan made aware that patient has only drained 1ml of CSF all day. He stated to continue to monitor and made sure follow up CT scan was ordered for the morning. Will continue to monitor.

## 2013-07-06 NOTE — Significant Event (Signed)
Pt noted to have elevated BP and HR.  Appears to be agitated.  Has received ativan with some improvement.  She has Rt ICH with hydrocephalus s/p ventriculostomy.  She could be have pain symptoms.  Will give 50 mcg fentanyl IV x one and monitor.  Coralyn Helling, MD 07/06/2013, 11:28 PM

## 2013-07-06 NOTE — H&P (Addendum)
PULMONARY  / CRITICAL CARE MEDICINE  Name: Alison Ward MRN: [id number] DOB: Apr 24, 1961    ADMISSION DATE:  07/06/2013 CONSULTATION DATE:  07/06/2013  REFERRING MD :  Dr. Lavella Lemons PRIMARY SERVICE: CCM  CHIEF COMPLAINT:  AMS  BRIEF PATIENT DESCRIPTION: 22 F with ICH and elavated ICP.  SIGNIFICANT EVENTS / STUDIES:  1. ICH 07/06/2013  LINES / TUBES: 1. PIV  CULTURES: 1. None  ANTIBIOTICS: 1. None  HISTORY OF PRESENT ILLNESS:  [name] is a 36 F with HTN who was BIBA for AMS. Patient is currently unable to provide any history; her two daughters are here with her. She complained of headaches earlier in the day today but later told her daughters that her headache had improved. She called her daughter several hours later complaining that she was lost. A nearby pedestrian called 911 and emergency services was able to locate her daughters.    PAST MEDICAL HISTORY :  Past Medical History  Diagnosis Date  . Hypertension     History reviewed. No pertinent past surgical history.  Prior to Admission medications   Medication Sig Start Date End Date Taking? Authorizing Provider  HYDROCHLOROTHIAZIDE PO Take 1 tablet by mouth daily.   Yes Historical Provider, MD  ibuprofen (ADVIL,MOTRIN) 200 MG tablet Take 200 mg by mouth every 6 (six) hours as needed for pain.   Yes Historical Provider, MD  lisinopril (PRINIVIL,ZESTRIL) 20 MG tablet Take 20 mg by mouth daily.   Yes Historical Provider, MD    No Known Allergies  FAMILY HISTORY:  Family History  Problem Relation Age of Onset  . Hypertension Mother   . Hypertension Father   . Diabetes Daughter     SOCIAL HISTORY:  reports that she has never smoked. She does not have any smokeless tobacco history on file. She reports that she does not drink alcohol or use illicit drugs.  REVIEW OF SYSTEMS:  Unable to obtain secondary to patient condition.  PHYSICAL EXAM  VITAL SIGNS: Temp:  [97.5 F (36.4 C)] 97.5 F (36.4 C) (09/07  0047) Pulse Rate:  [65-72] 69 (09/07 0200) Resp:  [12-18] 14 (09/07 0200) BP: (170-202)/(98-132) 200/104 mmHg (09/07 0200) SpO2:  [90 %-100 %] 90 % (09/07 0200)  HEMODYNAMICS:    VENTILATOR SETTINGS:    INTAKE / OUTPUT: Intake/Output   None     PHYSICAL EXAMINATION: General:  Obese F in NAD Neuro:  Arousable to verbal stimuli, GCS 10 HEENT:  Sclera anicteric, pupil equal and reactive bilaterally, MMM, OP clear Neck:  Trachea supple and midline, (-) JVD, (-) LAN Cardiovascular:  RRR, NS1/S2, (-) MRG Lungs:  CTAB Abdomen:  S/NT/ND/(+)BS Musculoskeletal:  (-) C/C/E, OA toes biltaerally Skin:  Intact  LABS:  CBC No results found for this basename: WBC, HGB, HCT, PLT,  in the last 72 hours  Coag's No results found for this basename: APTT, INR,  in the last 72 hours  BMET No results found for this basename: NA, K, CL, CO2, BUN, CREATININE, GLUCOSE,  in the last 72 hours  Electrolytes No results found for this basename: CALCIUM, MG, PHOS,  in the last 72 hours  Sepsis Markers No results found for this basename: LACTICACIDVEN, PROCALCITON, O2SATVEN,  in the last 72 hours  ABG No results found for this basename: PHART, PCO2ART, PO2ART,  in the last 72 hours  Liver Enzymes No results found for this basename: AST, ALT, ALKPHOS, BILITOT, ALBUMIN,  in the last 72 hours  Cardiac Enzymes No results found for this  basename: TROPONINI, PROBNP,  in the last 72 hours  Glucose No results found for this basename: GLUCAP,  in the last 72 hours  Imaging Ct Head Wo Contrast  07/06/2013   *RADIOLOGY REPORT*  Clinical Data:  Hypertension, severe headache  CT HEAD WITHOUT CONTRAST  Technique:  Contiguous axial images were obtained from the base of the skull through the vertex without contrast.  Comparison: 12/23/2011  Findings: Dilatation of the lateral ventricles. Large intracerebral hematoma identified at the right posterior parietotemporal region, 4.4 x 3.1 cm image 17. Evidence of  intraventricular extension of hemorrhage into the right lateral ventricle, third ventricle and minimally into frontal horn of the left lateral ventricle. Edema identified surrounding the intraparenchymal hemorrhage in the right hemisphere. Question small amount of subarachnoid hemorrhage at the high posterior right parietal region.  Mild diffuse effacement of sulci. 6 mm of right-to-left midline shift. No additional areas of hemorrhage, mass or acute infarction. Scattered falx calcifications. Bones and sinuses unremarkable.  IMPRESSION: Large intracerebral hematoma at the posterior right parietotemporal lobe, 4.1 x 3.1 cm in size with surrounding edema and evidence of intraventricular extension of hemorrhage. Question minimal subarachnoid blood at high posterior right parietal region. 6 mm of right-to-left midline shift and diffuse sulcal effacement noted.  Critical Value/emergent results were called by telephone at the time of interpretation on 07/06/2013 at 0125 hours to Dr. Lavella Lemons, who verbally acknowledged these results.   Original Report Authenticated By: Ulyses Southward, M.D.   Dg Chest Port 1 View  07/06/2013   *RADIOLOGY REPORT*  Clinical Data: Headaches.  Concern for bleed.  PORTABLE CHEST - 1 VIEW  Comparison: 06/09/2009  Findings: Shallow inspiration.  Mild cardiac enlargement. Pulmonary vascularity is normal for technique.  No focal consolidation or airspace disease.  No blunting of costophrenic angles.  No pneumothorax.  No significant change since previous study, allowing for technical differences.  IMPRESSION: Shallow inspiration.  Mild cardiac enlargement.  No evidence of active pulmonary disease.   Original Report Authenticated By: Burman Nieves, M.D.    EKG: Personally reviewed, Sinus. TWI V4-V6 CXR: Personally reviewed. Rotated. Clear.   ASSESSMENT / PLAN: Principal Problem:   Intracerebral hemorrhage Active Problems:   HYPERTENSION   PULMONARY A:  1. No active issues  P:    May  require intubation for airway protection.  CARDIOVASCULAR A:  1. HTN: Intrinsic + reactive. Goal SBP <= 140  P:   Nicardipine Drip  RENAL A:  1. No Acute Issues  P:    F/u BMP  GASTROINTESTINAL A:  No acute issues  HEMATOLOGIC A:   1. No Acute Issues  INFECTIOUS A: 1. No Acute Issues  ENDOCRINE A:   1. No Acute Issues  NEUROLOGIC A:  1. Intracerebral Hemorrhage  P:    Management Per NS; Plan for ventriculostomy at bedside  Serial Neuro Exams; if GCS drops below 8 will intubate for airway protection  BEST PRACTICE / DISPOSITION Level of Care:  ICU Consultants:  Neuro, NS Code Status:  Full Diet:  NPO DVT Px:  SCDs GI Px:  Protonix Skin Integrity:  Intact Social / Family:  Family at bedside  TODAY'S SUMMARY:   I have personally obtained a history, examined the patient, evaluated laboratory and imaging results, formulated the assessment and plan and placed orders.  CRITICAL CARE: The patient is critically ill with multiple organ systems failure and requires high complexity decision making for assessment and support, frequent evaluation and titration of therapies, application of advanced monitoring technologies and extensive  interpretation of multiple databases. Critical Care Time devoted to patient care services described in this note is 30 minutes.   Evalyn Casco, MD Pulmonary and Critical Care Medicine [facility] Pager: [phone]  07/06/2013, 2:21 AM

## 2013-07-06 NOTE — Consult Note (Signed)
Referring Physician: Dr. Lavella Lemons    Chief Complaint: Headache and altered mental status with intracerebral hemorrhage.  HPI: Alison Ward is an 52 y.o. female with a history of hypertension and poor compliance with taking medication, presenting with confusion and headache as well as lethargy. Patient was confused and called family members to get directions home. She was noted to be outside of the home but unknown resident. A pedestrian called EMS. Emergency services subsequently located family members. Blood pressure on arrival in the emergency room was 202/98. CT scan of her head showed a 4.1 x 3.1 cm parietotemporal intracerebral infarction with surrounding edema and intraventricular extension of hemorrhage. Patient had a 6 mm right to left midline shift. There is no previous history of stroke nor TIA. Patient has not been on antiplatelet therapy. NIH stroke score was 20.  LSN: Unclear tPA Given: No: ICH MRankin: 4  Past Medical History  Diagnosis Date  . Hypertension     Family History  Problem Relation Age of Onset  . Hypertension Mother   . Hypertension Father   . Diabetes Daughter      Medications: I have reviewed the patient's current medications.  ROS: History obtained from sibling and child  General ROS: negative for - chills, fatigue, fever, night sweats, weight gain or weight loss Psychological ROS: negative for - behavioral disorder, hallucinations, memory difficulties, mood swings or suicidal ideation Ophthalmic ROS: negative for - blurry vision, double vision, eye pain or loss of vision ENT ROS: negative for - epistaxis, nasal discharge, oral lesions, sore throat, tinnitus or vertigo Allergy and Immunology ROS: negative for - hives or itchy/watery eyes Hematological and Lymphatic ROS: negative for - bleeding problems, bruising or swollen lymph nodes Endocrine ROS: negative for - galactorrhea, hair pattern changes, polydipsia/polyuria or temperature  intolerance Respiratory ROS: negative for - cough, hemoptysis, shortness of breath or wheezing Cardiovascular ROS: negative for - chest pain, dyspnea on exertion, edema or irregular heartbeat Gastrointestinal ROS: negative for - abdominal pain, diarrhea, hematemesis, nausea/vomiting or stool incontinence Genito-Urinary ROS: negative for - dysuria, hematuria, incontinence or urinary frequency/urgency Musculoskeletal ROS: negative for - joint swelling or muscular weakness Neurological ROS: as noted in HPI Dermatological ROS: negative for rash and skin lesion changes  Physical Examination: Blood pressure 159/97, pulse 88, temperature 97.5 F (36.4 C), temperature source Oral, resp. rate 21, SpO2 97.00%.  Neurologic Examination: Mental Status: Patient was fairly lethargic and markedly confused.  Speech fluent without evidence of aphasia. Ability to follow commands was very limited with use of right upper extremity only. Patient had neglect of the entire left side. Cranial Nerves: II-dense left homonymous hemianopsia. III/IV/VI-Pupils were equal and reacted. Extraocular movements were limited to right sided and midline conjugately, with a gaze preference to the right side.    V/VII-slight left lower facial weakness. VIII-normal. X-mild dysarthria. Motor: No voluntary movement of left extremities with flaccid muscle tone; normal strength of right upper extremity proximally and distally; did not move right lower extremity to command. Sensory: Neglect of entire left side, including tactile sensation. Deep Tendon Reflexes: Trace to 1+ and symmetric. Plantars: Flexor bilaterally Cerebellar: Unable to assess due to patient's confusion  Ct Head Wo Contrast  07/06/2013   *RADIOLOGY REPORT*  Clinical Data:  Hypertension, severe headache  CT HEAD WITHOUT CONTRAST  Technique:  Contiguous axial images were obtained from the base of the skull through the vertex without contrast.  Comparison: 12/23/2011   Findings: Dilatation of the lateral ventricles. Large intracerebral hematoma identified at  the right posterior parietotemporal region, 4.4 x 3.1 cm image 17. Evidence of intraventricular extension of hemorrhage into the right lateral ventricle, third ventricle and minimally into frontal horn of the left lateral ventricle. Edema identified surrounding the intraparenchymal hemorrhage in the right hemisphere. Question small amount of subarachnoid hemorrhage at the high posterior right parietal region.  Mild diffuse effacement of sulci. 6 mm of right-to-left midline shift. No additional areas of hemorrhage, mass or acute infarction. Scattered falx calcifications. Bones and sinuses unremarkable.  IMPRESSION: Large intracerebral hematoma at the posterior right parietotemporal lobe, 4.1 x 3.1 cm in size with surrounding edema and evidence of intraventricular extension of hemorrhage. Question minimal subarachnoid blood at high posterior right parietal region. 6 mm of right-to-left midline shift and diffuse sulcal effacement noted.  Critical Value/emergent results were called by telephone at the time of interpretation on 07/06/2013 at 0125 hours to Dr. Lavella Lemons, who verbally acknowledged these results.   Original Report Authenticated By: Ulyses Southward, M.D.   Dg Chest Port 1 View  07/06/2013   *RADIOLOGY REPORT*  Clinical Data: Headaches.  Concern for bleed.  PORTABLE CHEST - 1 VIEW  Comparison: 06/09/2009  Findings: Shallow inspiration.  Mild cardiac enlargement. Pulmonary vascularity is normal for technique.  No focal consolidation or airspace disease.  No blunting of costophrenic angles.  No pneumothorax.  No significant change since previous study, allowing for technical differences.  IMPRESSION: Shallow inspiration.  Mild cardiac enlargement.  No evidence of active pulmonary disease.   Original Report Authenticated By: Burman Nieves, M.D.    Assessment: 52 y.o. female of hypertension, presenting with large right  parietotemporal intracerebral hemorrhage with ventricular extension and signs of early hydrocephalus. Etiology for hemorrhage is likely hypertension.  Stroke Risk Factors - hypertension  Plan: 1. Ventriculostomy and external ventricular drain per neurosurgery 2. MRI, MRA  of the brain without contrast 3. PT consult, OT consult, Speech consult 4. Echocardiogram 5. Carotid dopplers 6. Prophylactic therapy-None 7. Repeat CT scan of the head without contrast and 12-24 hours to rule out extension of cerebral hemorrhage 8. HgbA1c, fasting lipid panel   C.R. Roseanne Reno, MD [facility] [phone]  07/06/2013, 2:41 AM

## 2013-07-06 NOTE — Progress Notes (Signed)
Patient ID: Alison Ward, female   DOB: 1961-09-22, 52 y.o.   MRN: [id number] Subjective:  The patient is more alert and less confused than earlier this morning. She continues to complain of a headache.  Objective: Vital signs in last 24 hours: Temp:  [97.5 F (36.4 C)] 97.5 F (36.4 C) (09/07 0047) Pulse Rate:  [65-117] 117 (09/07 1030) Resp:  [12-29] 22 (09/07 1030) BP: (127-202)/(65-132) 159/100 mmHg (09/07 1030) SpO2:  [90 %-100 %] 99 % (09/07 1030) Weight:  [112.8 kg (248 lb 10.9 oz)] 112.8 kg (248 lb 10.9 oz) (09/07 0540)  Intake/Output from previous day: 09/06 0701 - 09/07 0700 In: 50 [IV Piggyback:50] Out: 3000 [Urine:3000] Intake/Output this shift: Total I/O In: 89.7 [I.V.:89.7] Out: 250 [Urine:250]  Physical exam the patient is Glasgow Coma Scale 14 ([id number]). She is moving all 4 extremities well. Her speech is normal but she is mildly confused. She is alert and oriented x2  Lab Results:  Recent Labs  07/06/13 0210  WBC 9.1  HGB 13.1  HCT 38.8  PLT 215   BMET  Recent Labs  07/06/13 0211  NA 138  K 2.9*  CL 101  CO2 26  GLUCOSE 157*  BUN 11  CREATININE 0.69  CALCIUM 8.9    Studies/Results: Ct Head Wo Contrast  07/06/2013   *RADIOLOGY REPORT*  Clinical Data:  Hypertension, severe headache  CT HEAD WITHOUT CONTRAST  Technique:  Contiguous axial images were obtained from the base of the skull through the vertex without contrast.  Comparison: 12/23/2011  Findings: Dilatation of the lateral ventricles. Large intracerebral hematoma identified at the right posterior parietotemporal region, 4.4 x 3.1 cm image 17. Evidence of intraventricular extension of hemorrhage into the right lateral ventricle, third ventricle and minimally into frontal horn of the left lateral ventricle. Edema identified surrounding the intraparenchymal hemorrhage in the right hemisphere. Question small amount of subarachnoid hemorrhage at the high posterior right parietal region.  Mild  diffuse effacement of sulci. 6 mm of right-to-left midline shift. No additional areas of hemorrhage, mass or acute infarction. Scattered falx calcifications. Bones and sinuses unremarkable.  IMPRESSION: Large intracerebral hematoma at the posterior right parietotemporal lobe, 4.1 x 3.1 cm in size with surrounding edema and evidence of intraventricular extension of hemorrhage. Question minimal subarachnoid blood at high posterior right parietal region. 6 mm of right-to-left midline shift and diffuse sulcal effacement noted.  Critical Value/emergent results were called by telephone at the time of interpretation on 07/06/2013 at 0125 hours to Dr. Lavella Lemons, who verbally acknowledged these results.   Original Report Authenticated By: Ulyses Southward, M.D.   Dg Chest Port 1 View  07/06/2013   *RADIOLOGY REPORT*  Clinical Data: Headaches.  Concern for bleed.  PORTABLE CHEST - 1 VIEW  Comparison: 06/09/2009  Findings: Shallow inspiration.  Mild cardiac enlargement. Pulmonary vascularity is normal for technique.  No focal consolidation or airspace disease.  No blunting of costophrenic angles.  No pneumothorax.  No significant change since previous study, allowing for technical differences.  IMPRESSION: Shallow inspiration.  Mild cardiac enlargement.  No evidence of active pulmonary disease.   Original Report Authenticated By: Burman Nieves, M.D.    Assessment/Plan: Right parietal intracerebral hemorrhage, intraventricular hemorrhage, hydrocephalus: The patient has improved clinically over the last few hours. Her ventriculostomy initially drain well but has stopped draining. I aseptically flushed with approximately 2 cc preservative-free saline. The ventriculostomy is sluggish but pulsating. We will plan to repeat her CAT scan tomorrow or sooner if her clinical status  should decline.  LOS: 0 days     [name] 07/06/2013, 10:48 AM

## 2013-07-07 ENCOUNTER — Encounter (HOSPITAL_COMMUNITY): Payer: Self-pay | Admitting: *Deleted

## 2013-07-07 ENCOUNTER — Inpatient Hospital Stay (HOSPITAL_COMMUNITY): Payer: Self-pay | Admitting: *Deleted

## 2013-07-07 ENCOUNTER — Inpatient Hospital Stay (HOSPITAL_COMMUNITY): Payer: Self-pay

## 2013-07-07 ENCOUNTER — Encounter (HOSPITAL_COMMUNITY): Payer: Self-pay | Admitting: Radiology

## 2013-07-07 ENCOUNTER — Encounter (HOSPITAL_COMMUNITY)
Admission: EM | Disposition: A | Discharge status: Rehabilitation Facility | Payer: Self-pay | Source: Home / Self Care | Attending: Internal Medicine

## 2013-07-07 DIAGNOSIS — G911 Obstructive hydrocephalus: Secondary | ICD-10-CM

## 2013-07-07 DIAGNOSIS — R4182 Altered mental status, unspecified: Secondary | ICD-10-CM

## 2013-07-07 DIAGNOSIS — G936 Cerebral edema: Secondary | ICD-10-CM

## 2013-07-07 DIAGNOSIS — I10 Essential (primary) hypertension: Secondary | ICD-10-CM

## 2013-07-07 DIAGNOSIS — I619 Nontraumatic intracerebral hemorrhage, unspecified: Principal | ICD-10-CM

## 2013-07-07 HISTORY — PX: VENTRICULOSTOMY: SHX5377

## 2013-07-07 LAB — CBC
HCT: 39.4 % (ref 36.0–46.0)
Hemoglobin: 13.3 g/dL (ref 12.0–15.0)
MCH: 28 pg (ref 26.0–34.0)
MCHC: 33.8 g/dL (ref 30.0–36.0)
MCV: 82.9 fL (ref 78.0–100.0)
Platelets: 217 10*3/uL (ref 150–400)
RBC: 4.75 MIL/uL (ref 3.87–5.11)
RDW: 16 % — ABNORMAL HIGH (ref 11.5–15.5)
WBC: 14.9 10*3/uL — ABNORMAL HIGH (ref 4.0–10.5)

## 2013-07-07 LAB — BASIC METABOLIC PANEL
BUN: 12 mg/dL (ref 6–23)
CO2: 25 mEq/L (ref 19–32)
Calcium: 9.3 mg/dL (ref 8.4–10.5)
Chloride: 99 mEq/L (ref 96–112)
Creatinine, Ser: 0.69 mg/dL (ref 0.50–1.10)
GFR calc Af Amer: >90 mL/min (ref >90)
GFR calc non Af Amer: >90 mL/min (ref >90)
Glucose, Bld: 112 mg/dL — ABNORMAL HIGH (ref 70–99)
Potassium: 3.4 mEq/L — ABNORMAL LOW (ref 3.5–5.1)
Sodium: 136 mEq/L (ref 135–145)

## 2013-07-07 SURGERY — VENTRICULOSTOMY
Anesthesia: Monitor Anesthesia Care | Site: Head | Laterality: Left | Wound class: Clean

## 2013-07-07 MED ORDER — ONDANSETRON HCL 4 MG/2ML IJ SOLN: 4.0000 mg | Freq: Once | INTRAMUSCULAR | Status: DC | PRN

## 2013-07-07 MED ORDER — SODIUM CHLORIDE 3 % IV SOLN
INTRAVENOUS | Status: DC
  Administered 2013-07-07 (×2): 75 mL/h via INTRAVENOUS
  Administered 2013-07-08: 14:00:00 via INTRAVENOUS
  Filled 2013-07-07 (×10): qty 500

## 2013-07-07 MED ORDER — BIOTENE DRY MOUTH MT LIQD
15.0000 mL | Freq: Two times a day (BID) | OROMUCOSAL | Status: DC
  Administered 2013-07-08 – 2013-07-09 (×4): 15 mL via OROMUCOSAL

## 2013-07-07 MED ORDER — FENTANYL CITRATE 0.05 MG/ML IJ SOLN
INTRAMUSCULAR | Status: DC | PRN
  Administered 2013-07-07 (×2): 25 ug via INTRAVENOUS
  Administered 2013-07-07: 50 ug via INTRAVENOUS
  Administered 2013-07-07: 25 ug via INTRAVENOUS

## 2013-07-07 MED ORDER — 0.9 % SODIUM CHLORIDE (POUR BTL) OPTIME
TOPICAL | Status: DC | PRN
  Administered 2013-07-07: 1000 mL

## 2013-07-07 MED ORDER — NICARDIPINE HCL IN NACL 40-0.83 MG/200ML-% IV SOLN
5.0000 mg/h | INTRAVENOUS | Status: DC
  Administered 2013-07-07: 12 mg/h via INTRAVENOUS
  Filled 2013-07-07 (×5): qty 200

## 2013-07-07 MED ORDER — IOHEXOL 350 MG/ML SOLN
50.0000 mL | Freq: Once | INTRAVENOUS | Status: AC | PRN
  Administered 2013-07-07: 50 mL via INTRAVENOUS

## 2013-07-07 MED ORDER — FENTANYL CITRATE 0.05 MG/ML IJ SOLN: 25.0000 ug | INTRAMUSCULAR | Status: DC | PRN

## 2013-07-07 MED ORDER — SODIUM CHLORIDE 0.9 % IV SOLN
INTRAVENOUS | Status: DC | PRN
  Administered 2013-07-07: 17:00:00 via INTRAVENOUS

## 2013-07-07 MED ORDER — HEMOSTATIC AGENTS (NO CHARGE) OPTIME
TOPICAL | Status: DC | PRN
  Administered 2013-07-07: 1 via TOPICAL

## 2013-07-07 MED ORDER — CHLORHEXIDINE GLUCONATE 0.12 % MT SOLN
15.0000 mL | Freq: Two times a day (BID) | OROMUCOSAL | Status: DC
  Administered 2013-07-07 – 2013-07-08 (×2): 15 mL via OROMUCOSAL
  Filled 2013-07-07 (×3): qty 15

## 2013-07-07 MED ORDER — CEFAZOLIN SODIUM-DEXTROSE 2-3 GM-% IV SOLR
INTRAVENOUS | Status: AC
  Filled 2013-07-07: qty 50

## 2013-07-07 MED ORDER — METOPROLOL TARTRATE 1 MG/ML IV SOLN
INTRAVENOUS | Status: DC | PRN
  Administered 2013-07-07: 1 mg via INTRAVENOUS

## 2013-07-07 MED ORDER — THROMBIN 5000 UNITS EX SOLR
CUTANEOUS | Status: DC | PRN
  Administered 2013-07-07 (×2): 5000 [IU] via TOPICAL

## 2013-07-07 MED ORDER — BUPIVACAINE-EPINEPHRINE 0.5% -1:200000 IJ SOLN
INTRAMUSCULAR | Status: DC | PRN
  Administered 2013-07-07: 7 mL

## 2013-07-07 MED ORDER — HYDROCHLOROTHIAZIDE 25 MG PO TABS
25.0000 mg | ORAL_TABLET | Freq: Every day | ORAL | Status: DC
  Administered 2013-07-09 – 2013-07-15 (×7): 25 mg via ORAL
  Filled 2013-07-07 (×9): qty 1

## 2013-07-07 MED ORDER — LISINOPRIL 5 MG PO TABS
5.0000 mg | ORAL_TABLET | Freq: Every day | ORAL | Status: DC
  Administered 2013-07-09 – 2013-07-15 (×7): 5 mg via ORAL
  Filled 2013-07-07 (×9): qty 1

## 2013-07-07 MED ORDER — POTASSIUM CHLORIDE 10 MEQ/100ML IV SOLN
10.0000 meq | INTRAVENOUS | Status: AC
  Administered 2013-07-07 (×2): 10 meq via INTRAVENOUS
  Filled 2013-07-07 (×2): qty 100

## 2013-07-07 SURGICAL SUPPLY — 65 items
APL SKNCLS STERI-STRIP NONHPOA (GAUZE/BANDAGES/DRESSINGS) ×1
BAG DECANTER FOR FLEXI CONT (MISCELLANEOUS) ×1 IMPLANT
BENZOIN TINCTURE PRP APPL 2/3 (GAUZE/BANDAGES/DRESSINGS) ×2 IMPLANT
BLADE SURG 10 STRL SS (BLADE) ×4 IMPLANT
BLADE SURG 15 STRL LF DISP TIS (BLADE) ×1 IMPLANT
BLADE SURG 15 STRL SS (BLADE) ×4
BLADE SURG ROTATE 9660 (MISCELLANEOUS) ×4 IMPLANT
BOOT SUTURE AID YELLOW STND (SUTURE) ×1 IMPLANT
BRUSH SCRUB EZ 1% IODOPHOR (MISCELLANEOUS) ×1 IMPLANT
BUR ACORN 6.0 PRECISION (BURR) ×3 IMPLANT
CANISTER SUCTION 2500CC (MISCELLANEOUS) ×2 IMPLANT
CATH VENTRIC 35X38 W/TROCAR LG (CATHETERS) ×1 IMPLANT
CLIP RANEY DISP (INSTRUMENTS) IMPLANT
CLOTH BEACON ORANGE TIMEOUT ST (SAFETY) ×3 IMPLANT
CONT SPEC 4OZ CLIKSEAL STRL BL (MISCELLANEOUS) ×1 IMPLANT
CORDS BIPOLAR (ELECTRODE) ×3 IMPLANT
COVER MAYO STAND STRL (DRAPES) ×3 IMPLANT
DRAIN BAG CSF ACCUDRAIN (MISCELLANEOUS) ×1 IMPLANT
DRAPE POUCH INSTRU U-SHP 10X18 (DRAPES) ×3 IMPLANT
DRAPE PROXIMA HALF (DRAPES) ×3 IMPLANT
DRAPE SURG 17X23 STRL (DRAPES) ×6 IMPLANT
DRESSING TELFA 8X3 (GAUZE/BANDAGES/DRESSINGS) IMPLANT
DRSG OPSITE POSTOP 3X4 (GAUZE/BANDAGES/DRESSINGS) ×1 IMPLANT
ELECT CAUTERY BLADE 6.4 (BLADE) ×3 IMPLANT
ELECT REM PT RETURN 9FT ADLT (ELECTROSURGICAL) ×2
ELECTRODE REM PT RTRN 9FT ADLT (ELECTROSURGICAL) ×1 IMPLANT
GAUZE SPONGE 4X4 16PLY XRAY LF (GAUZE/BANDAGES/DRESSINGS) ×4 IMPLANT
GLOVE BIO SURGEON STRL SZ8.5 (GLOVE) ×3 IMPLANT
GLOVE EXAM NITRILE LRG STRL (GLOVE) IMPLANT
GLOVE EXAM NITRILE MD LF STRL (GLOVE) ×1 IMPLANT
GLOVE EXAM NITRILE XL STR (GLOVE) IMPLANT
GLOVE EXAM NITRILE XS STR PU (GLOVE) IMPLANT
GLOVE SS BIOGEL STRL SZ 8 (GLOVE) ×1 IMPLANT
GLOVE SUPERSENSE BIOGEL SZ 8 (GLOVE) ×2
GOWN BRE IMP SLV AUR LG STRL (GOWN DISPOSABLE) IMPLANT
GOWN BRE IMP SLV AUR XL STRL (GOWN DISPOSABLE) ×4 IMPLANT
KIT BASIN OR (CUSTOM PROCEDURE TRAY) ×3 IMPLANT
KIT ROOM TURNOVER OR (KITS) ×2 IMPLANT
NEEDLE HYPO 22GX1.5 SAFETY (NEEDLE) ×3 IMPLANT
NS IRRIG 1000ML POUR BTL (IV SOLUTION) ×2 IMPLANT
PACK EENT II TURBAN DRAPE (CUSTOM PROCEDURE TRAY) ×3 IMPLANT
PAD ARMBOARD 7.5X6 YLW CONV (MISCELLANEOUS) ×6 IMPLANT
PATTIES SURGICAL .5 X.5 (GAUZE/BANDAGES/DRESSINGS) ×1 IMPLANT
PATTIES SURGICAL 1X1 (DISPOSABLE) IMPLANT
PENCIL BUTTON HOLSTER BLD 10FT (ELECTRODE) ×3 IMPLANT
SPONGE LAP 4X18 X RAY DECT (DISPOSABLE) ×1 IMPLANT
STAPLER SKIN PROX WIDE 3.9 (STAPLE) ×3 IMPLANT
STRIP CLOSURE SKIN 1/2X4 (GAUZE/BANDAGES/DRESSINGS) ×1 IMPLANT
SUT BONE WAX W31G (SUTURE) ×1 IMPLANT
SUT ETHILON 2 0 FS 18 (SUTURE) ×4 IMPLANT
SUT ETHILON 3 0 PS 1 (SUTURE) IMPLANT
SUT NURALON 4 0 TR CR/8 (SUTURE) IMPLANT
SUT SILK 0 TIES 10X30 (SUTURE) IMPLANT
SUT VIC AB 1 CT1 18XBRD ANBCTR (SUTURE) IMPLANT
SUT VIC AB 1 CT1 8-18 (SUTURE)
SUT VIC AB 2-0 CP2 18 (SUTURE) ×3 IMPLANT
SUT VIC AB 3-0 SH 8-18 (SUTURE) ×3 IMPLANT
SYR BULB 3OZ (MISCELLANEOUS) ×1 IMPLANT
SYR CONTROL 10ML LL (SYRINGE) ×3 IMPLANT
TOWEL OR 17X24 6PK STRL BLUE (TOWEL DISPOSABLE) ×3 IMPLANT
TOWEL OR 17X26 10 PK STRL BLUE (TOWEL DISPOSABLE) ×3 IMPLANT
TRAY FOLEY CATH 14FRSI W/METER (CATHETERS) IMPLANT
TUBE CONNECTING 12X1/4 (SUCTIONS) ×3 IMPLANT
UNDERPAD 30X30 INCONTINENT (UNDERPADS AND DIAPERS) ×1 IMPLANT
WATER STERILE IRR 1000ML POUR (IV SOLUTION) ×2 IMPLANT

## 2013-07-07 NOTE — Progress Notes (Signed)
Patient ID: Alison Ward, female   DOB: [birth year], 52 y.o.   MRN: [id number] Subjective:  The patient is more somnolent today. She did receive some Ativan this morning for her MRI scan. I spoke with the patient's daughters.  Objective: Vital signs in last 24 hours: Temp:  [97.2 F (36.2 C)-99 F (37.2 C)] 98.9 F (37.2 C) (09/08 0800) Pulse Rate:  [85-128] 109 (09/08 1000) Resp:  [14-35] 16 (09/08 1000) BP: (133-198)/(68-118) 163/90 mmHg (09/08 0945) SpO2:  [92 %-100 %] 98 % (09/08 1000) Weight:  [111.5 kg (245 lb 13 oz)] 111.5 kg (245 lb 13 oz) (09/08 0500)  Intake/Output from previous day: 09/07 0701 - 09/08 0700 In: 2063.3 [I.V.:1298.3; IV Piggyback:765] Out: 3303 [Urine:3300; Emesis/NG output:1; Drains:2] Intake/Output this shift: Total I/O In: 301.3 [I.V.:81.3; Other:20; IV Piggyback:200] Out: 650 [Urine:650]  Physical exam Glasgow Coma Scale 13 ([id number]). The patient is somnolent and easily falls back to sleep. She is moving all 4 extremities.  The patient's ventriculostomy is not draining.  I reviewed the patient's head CT performed today. It demonstrates good position of the ventriculostomy in the right lateral ventricle, but it is in the middle of a blood clot. Her ventricle size has not changed.  Lab Results:  Recent Labs  07/06/13 0210 07/07/13 0345  WBC 9.1 14.9*  HGB 13.1 13.3  HCT 38.8 39.4  PLT 215 217   BMET  Recent Labs  07/06/13 1530 07/07/13 0345  NA 137 136  K 3.4* 3.4*  CL 97 99  CO2 23 25  GLUCOSE 125* 112*  BUN 10 12  CREATININE 0.61 0.69  CALCIUM 9.4 9.3    Studies/Results: Ct Angio Head W/cm &/or Wo Cm  07/07/2013   *RADIOLOGY REPORT*  Clinical Data:  Intracranial hemorrhage.  CT ANGIOGRAPHY HEAD  Technique:  Multidetector CT imaging of the head was performed using the standard protocol during bolus administration of intravenous contrast.  Multiplanar CT image reconstructions including MIPs were obtained to evaluate the vascular  anatomy.  Contrast: 50mL OMNIPAQUE IOHEXOL 350 MG/ML SOLN  Comparison:  CT head without contrast 07/06/2013.  Findings:  A right parietal and temporal lobe hemorrhage is not significantly changed in size surrounding edema is as expected. Intraventricular hemorrhage is more pronounced than on the prior exam.  The right frontal ventriculostomy catheter is now in place. There is minimal hemorrhage along the shunt site.  Subarachnoid blood over the left parietal convexity is again noted.  There is no significant hydrocephalus.  The paranasal sinuses and mastoid air cells are clear.  The osseous skull is otherwise intact.  The postcontrast images demonstrate no pathologic enhancement.  CTA images demonstrate atherosclerotic calcifications within the cavernous carotid arteries bilaterally.  Enlarged empty sella is noted.  No significant vascular stenoses are evident.  The right A1 segment is dominant.  The anterior communicating artery is patent. The MCA bifurcations are within normal limits bilaterally.  The ACA and MCA branch vessels are normal.  No significant vascular lesions are associated with the area of hemorrhage.  The vertebral arteries are codominant.  The basilar artery is within normal limits.  Prominent posterior communicating arteries are present bilaterally.  P1 segments contribute as well, more equally on the left.  There is some irregularity of distal PCA branch vessels.  The dural sinuses are patent.   Review of the MIP images confirms the above findings.  IMPRESSION:  1.  Minimal distal small vessel disease. 2.  No significant vascular lesion associated with the area of  hemorrhage. 3.  Stable right parietal parenchymal hemorrhage. 4.  Status post right to the tracheostomy placement without evidence for hydrocephalus. 5.  The extent of intraventricular hemorrhage has increased.   Original Report Authenticated By: Marin Roberts, M.D.   Ct Head Wo Contrast  07/06/2013   *RADIOLOGY REPORT*   Clinical Data:  Hypertension, severe headache  CT HEAD WITHOUT CONTRAST  Technique:  Contiguous axial images were obtained from the base of the skull through the vertex without contrast.  Comparison: 12/23/2011  Findings: Dilatation of the lateral ventricles. Large intracerebral hematoma identified at the right posterior parietotemporal region, 4.4 x 3.1 cm image 17. Evidence of intraventricular extension of hemorrhage into the right lateral ventricle, third ventricle and minimally into frontal horn of the left lateral ventricle. Edema identified surrounding the intraparenchymal hemorrhage in the right hemisphere. Question small amount of subarachnoid hemorrhage at the high posterior right parietal region.  Mild diffuse effacement of sulci. 6 mm of right-to-left midline shift. No additional areas of hemorrhage, mass or acute infarction. Scattered falx calcifications. Bones and sinuses unremarkable.  IMPRESSION: Large intracerebral hematoma at the posterior right parietotemporal lobe, 4.1 x 3.1 cm in size with surrounding edema and evidence of intraventricular extension of hemorrhage. Question minimal subarachnoid blood at high posterior right parietal region. 6 mm of right-to-left midline shift and diffuse sulcal effacement noted.  Critical Value/emergent results were called by telephone at the time of interpretation on 07/06/2013 at 0125 hours to Dr. Lavella Lemons, who verbally acknowledged these results.   Original Report Authenticated By: Ulyses Southward, M.D.   Dg Chest Port 1 View  07/07/2013   *RADIOLOGY REPORT*  Clinical Data: Pneumonia  PORTABLE CHEST - 1 VIEW  Comparison: Prior chest x-ray 07/06/2013  Findings: Increased pulmonary vascular congestion now with mild interstitial edema.  Stable cardiomegaly.  No focal airspace consolidation.  No pneumothorax.  No acute osseous abnormality.  IMPRESSION:  Increased pulmonary vascular congestion now with mild pulmonary edema.  Stable cardiomegaly.   Original Report  Authenticated By: Malachy Moan, M.D.   Dg Chest Port 1 View  07/06/2013   *RADIOLOGY REPORT*  Clinical Data: Headaches.  Concern for bleed.  PORTABLE CHEST - 1 VIEW  Comparison: 06/09/2009  Findings: Shallow inspiration.  Mild cardiac enlargement. Pulmonary vascularity is normal for technique.  No focal consolidation or airspace disease.  No blunting of costophrenic angles.  No pneumothorax.  No significant change since previous study, allowing for technical differences.  IMPRESSION: Shallow inspiration.  Mild cardiac enlargement.  No evidence of active pulmonary disease.   Original Report Authenticated By: Burman Nieves, M.D.    Assessment/Plan: Right intracerebral hemorrhage, intraventricular hemorrhage, hydrocephalus: I discussed the situation with the patient's family and subsequently Dr. Pearlean Brownie. We discussed the various treatment options including laser of an intra-catheter TPA, removal of the nonfunctioning ventriculostomy and medical management with 3% saline, placement of a left frontal ventriculostomy. I doubt that placing the TPA will help. We have decided to let the Ativan wear off and give her 3% saline. If she doesn't improve neurologically then I will place a left frontal ventriculostomy. I explained the procedure to the patient's daughter, we discussed the risks including risks of infection, hemorrhage, ventricle ostomy malfunction or malplacement, medical risk, etc. I've answered all her questions.  LOS: 1 day     Demarus Latterell D 07/07/2013, 10:17 AM

## 2013-07-07 NOTE — Progress Notes (Signed)
eLink Physician-Brief Progress Note Patient Name: Alison Ward DOB: 02/16/1961 MRN: [id number]  Date of Service  07/07/2013   HPI/Events of Note   BMET    Component Value Date/Time   NA 142 07/07/2013 2000   K 3.4* 07/07/2013 0345   CL 99 07/07/2013 0345   CO2 25 07/07/2013 0345   GLUCOSE 112* 07/07/2013 0345   BUN 12 07/07/2013 0345   CREATININE 0.69 07/07/2013 0345   CALCIUM 9.3 07/07/2013 0345   GFRNONAA >90 07/07/2013 0345   GFRAA >90 07/07/2013 0345   Currently on 3% NaCl at 75cc/h  eICU Interventions  Typical rate 1-2cc/kg/h and her wt 111kg. I initially increased to 100cc/h, but after nurse review with Dr Roseanne Reno will change it back to 75cc/h.    Intervention Category Major Interventions: Other:  [name] S. 07/07/2013, 9:43 PM

## 2013-07-07 NOTE — Preoperative (Signed)
Beta Blockers   Reason not to administer Beta Blockers:Not Applicable 

## 2013-07-07 NOTE — Progress Notes (Signed)
Physical Therapy Evaluation Patient Details Name: Alison Ward MRN: [id number] DOB: 12/07/1960 Today's Date: 07/07/2013 Time: 4098-1191 PT Time Calculation (min): 23 min  PT Assessment / Plan / Recommendation History of Present Illness   Pt admit with right ICH with hydrocephalus.  Ventriculostomy in place with IVC drain.    Clinical Impression  Pt admitted with right ICH. Pt currently with functional limitations due to the deficits listed below (see PT Problem List). Pt is a good rehab candidate when medically stable.  Pt will benefit from skilled PT to increase their independence and safety with mobility to allow discharge to the venue listed below.    PT Assessment  Patient needs continued PT services    Follow Up Recommendations  CIR;Supervision/Assistance - 24 hour                Equipment Recommendations  Other (comment) (TBA)    Recommendations for Other Services Rehab consult   Frequency Min 3X/week    Precautions / Restrictions Precautions Precautions: Fall Restrictions Weight Bearing Restrictions: No   Pertinent Vitals/Pain VSS, no pain reported      Mobility  Bed Mobility Bed Mobility: Rolling Left;Left Sidelying to Sit;Sitting - Scoot to Edge of Bed Rolling Left: 1: +2 Total assist Rolling Left: Patient Percentage: 50% Left Sidelying to Sit: 1: +2 Total assist;HOB elevated Left Sidelying to Sit: Patient Percentage: 50% Sitting - Scoot to Edge of Bed: 3: Mod assist Details for Bed Mobility Assistance: Pt needed asssit to move LEs off bed and to elevate trunk Transfers Transfers: Lateral/Scoot Transfers Lateral/Scoot Transfers: 1: +2 Total assist Lateral Transfers: Patient Percentage: 50% Details for Transfer Assistance: Pt able to perform scoot up to [facility] with 2 person assist and cues.   Ambulation/Gait Ambulation/Gait Assistance: Not tested (comment) Stairs: No Wheelchair Mobility Wheelchair Mobility: No Modified Rankin (Stroke Patients  Only) Pre-Morbid Rankin Score: No symptoms Modified Rankin: Severe disability         PT Diagnosis: Generalized weakness  PT Problem List: Decreased activity tolerance;Decreased balance;Decreased mobility;Decreased knowledge of use of DME;Decreased safety awareness;Decreased cognition;Decreased knowledge of precautions PT Treatment Interventions: DME instruction;Gait training;Functional mobility training;Therapeutic activities;Therapeutic exercise;Balance training;Cognitive remediation;Patient/family education     PT Goals(Current goals can be found in the care plan section) Acute Rehab PT Goals Patient Stated Goal: to go home PT Goal Formulation: With patient/family Time For Goal Achievement: 07/21/13 Potential to Achieve Goals: Good  Visit Information  Assistance Needed: +2 PT/OT Co-Evaluation/Treatment: Yes       Prior Functioning  Home Living Family/patient expects to be discharged to:: Private residence Living Arrangements: Alone Available Help at Discharge: Family;Available 24 hours/day Type of Home: Apartment Home Access: Stairs to enter Entrance Stairs-Number of Steps: 3 down with right rail and then 6 with both rails Entrance Stairs-Rails:  (see above) Home Layout: One level Home Equipment: None Additional Comments: administrative job at work Prior Function Level of Independence: Independent Comments: tub with curtain and standard toilet Communication Communication: No difficulties Dominant Hand: Right    Cognition  Cognition Arousal/Alertness: Lethargic Behavior During Therapy: Flat affect Overall Cognitive Status: Impaired/Different from baseline Area of Impairment: Orientation;Attention;Memory;Following commands;Safety/judgement;Awareness;Problem solving Orientation Level: Disoriented to;Place;Time;Situation Current Attention Level: Focused Following Commands: Follows one step commands inconsistently;Follows one step commands with increased  time Safety/Judgement: Decreased awareness of safety;Decreased awareness of deficits Problem Solving: Slow processing;Decreased initiation;Difficulty sequencing;Requires verbal cues;Requires tactile cues General Comments: Pt easily distracted from tasks.      Extremity/Trunk Assessment Upper Extremity Assessment Upper Extremity Assessment: Defer to  OT evaluation Lower Extremity Assessment Lower Extremity Assessment: Generalized weakness   Balance Balance Balance Assessed: Yes Static Sitting Balance Static Sitting - Balance Support: Bilateral upper extremity supported;Feet supported Static Sitting - Level of Assistance: 3: Mod assist;2: Max assist  End of Session PT - End of Session Equipment Utilized During Treatment: Gait belt Activity Tolerance: Patient limited by fatigue Patient left: in bed;with call bell/phone within reach;with family/visitor present Nurse Communication: Mobility status       INGOLD,Tymber Stallings 07/07/2013, 1:38 PM [facility] Acute Rehabilitation [phone] (pager)

## 2013-07-07 NOTE — Progress Notes (Signed)
Unable to attempt PICC line placement due to small veins on ultrasound.  Catheter occupies >50% of all veins in both right and left arm.  Bedside RN updated. Renad Jenniges, Lajean Manes, RN IV/PICC team

## 2013-07-07 NOTE — Progress Notes (Signed)
Speech Language Pathology Dysphagia Treatment Patient Details Name: Alison Ward MRN: [id number] DOB: 01/17/1961 Today's Date: 07/07/2013 Time: 4098-1191 SLP Time Calculation (min): 25 min  Assessment / Plan / Recommendation Clinical Impression  Pt continues to be lethargic, but intermittently opens eyes and requests water. SLP offered trials of thin liquids which pt readily accepted without evidence of oral dysphagia or aspiration. Pt also consumed 2 oz of puree without difficulty. Though periods of arousal are too brief for full meals, pt is safe to attempt sips of water or meds in puree when maximally alert. SLP will f/u tomorrow for further progress and ability to start diet. Did not complete SLE due to briefly focused attention and arrival of PA for procedure.     Diet Recommendation  Continue with Current Diet: NPO (except sips of water and meds in puree. )    SLP Plan Continue with current plan of care   Pertinent Vitals/Pain NA   Swallowing Goals  SLP Swallowing Goals Goal #3: Maintain LOA and sustained attention to consume diagnostic PO trials of puree and regular solids to determine PO readiness Swallow Study Goal #3 - Progress: Progressing toward goal  General Temperature Spikes Noted: No Respiratory Status: Room air Behavior/Cognition: Cooperative;Confused;Lethargic;Requires cueing Oral Cavity - Dentition: Adequate natural dentition Patient Positioning: Upright in bed  Oral Cavity - Oral Hygiene Does patient have any of the following "at risk" factors?: Nutritional status - inadequate Patient is AT RISK - Oral Care Protocol followed (see row info): Yes   Dysphagia Treatment Treatment focused on: Upgraded PO texture trials;Utilization of compensatory strategies Treatment Methods/Modalities: Skilled observation Patient observed directly with PO's: Yes Type of PO's observed: Thin liquids;Dysphagia 1 (puree) Feeding: Total assist Liquids provided via: Cup;Straw Type  of cueing: Verbal;Tactile Amount of cueing: Moderate   GO    Alison Ward, [place] CCC-SLP [phone]  Alison Ward 07/07/2013, 2:36 PM

## 2013-07-07 NOTE — Progress Notes (Signed)
Rehab Admissions Coordinator Note:  Patient was screened by Trish Mage for appropriateness for an Inpatient Acute Rehab Consult. Noted PT and OT recommending CIR.  At this time, we are recommending Inpatient Rehab consult.  Trish Mage 07/07/2013, 3:33 PM  I can be reached at [phone].

## 2013-07-07 NOTE — Anesthesia Preprocedure Evaluation (Signed)
Anesthesia Evaluation  Patient identified by MRN, date of birth, ID band Patient awake    Reviewed: Allergy & Precautions, H&P , NPO status , Patient's Chart, lab work & pertinent test results  Airway       Dental   Pulmonary  breath sounds clear to auscultation        Cardiovascular hypertension, Pt. on medications Rhythm:Regular Rate:Normal     Neuro/Psych Ventricle hemorrhage, altered mental status.    GI/Hepatic   Endo/Other  Morbid obesity  Renal/GU      Musculoskeletal   Abdominal   Peds  Hematology   Anesthesia Other Findings Unable to cooperate on command or answer questions appropriately.  Reproductive/Obstetrics                           Anesthesia Physical Anesthesia Plan  ASA: IV  Anesthesia Plan: MAC   Post-op Pain Management:    Induction: Intravenous  Airway Management Planned: Simple Face Mask  Additional Equipment:   Intra-op Plan:   Post-operative Plan:   Informed Consent:   History available from chart only  Plan Discussed with: CRNA, Anesthesiologist and Surgeon  Anesthesia Plan Comments:         Anesthesia Quick Evaluation

## 2013-07-07 NOTE — Progress Notes (Signed)
Patient ID: Alison Ward, female   DOB: 05/06/1961, 52 y.o.   MRN: [id number] Subjective:  The patient is more somnolent than this morning.  Objective: Vital signs in last 24 hours: Temp:  [98 F (36.7 C)-99 F (37.2 C)] 98.3 F (36.8 C) (09/08 1200) Pulse Rate:  [85-128] 104 (09/08 1645) Resp:  [14-35] 26 (09/08 1645) BP: (133-205)/(70-156) 175/95 mmHg (09/08 1645) SpO2:  [92 %-100 %] 97 % (09/08 1645) Weight:  [111.5 kg (245 lb 13 oz)] 111.5 kg (245 lb 13 oz) (09/08 0500)  Intake/Output from previous day: 09/07 0701 - 09/08 0700 In: 2063.3 [I.V.:1298.3; IV Piggyback:765] Out: 3303 [Urine:3300; Emesis/NG output:1; Drains:2] Intake/Output this shift: Total I/O In: 565.4 [I.V.:210.4; Other:50; IV Piggyback:305] Out: 1859 [Urine:1855; Drains:4]  Physical exam the patient is Glasgow Coma Scale 9, (E2 M5V2) she is moving all 4 extremities. She is at times agitated other times quite somnolent.  Lab Results:  Recent Labs  07/06/13 0210 07/07/13 0345  WBC 9.1 14.9*  HGB 13.1 13.3  HCT 38.8 39.4  PLT 215 217   BMET  Recent Labs  07/06/13 1530 07/07/13 0345 07/07/13 1110 07/07/13 1400  NA 137 136 134* 139  K 3.4* 3.4*  --   --   CL 97 99  --   --   CO2 23 25  --   --   GLUCOSE 125* 112*  --   --   BUN 10 12  --   --   CREATININE 0.61 0.69  --   --   CALCIUM 9.4 9.3  --   --     Studies/Results: Ct Angio Head W/cm &/or Wo Cm  07/07/2013   *RADIOLOGY REPORT*  Clinical Data:  Intracranial hemorrhage.  CT ANGIOGRAPHY HEAD  Technique:  Multidetector CT imaging of the head was performed using the standard protocol during bolus administration of intravenous contrast.  Multiplanar CT image reconstructions including MIPs were obtained to evaluate the vascular anatomy.  Contrast: 50mL OMNIPAQUE IOHEXOL 350 MG/ML SOLN  Comparison:  CT head without contrast 07/06/2013.  Findings:  A right parietal and temporal lobe hemorrhage is not significantly changed in size surrounding  edema is as expected. Intraventricular hemorrhage is more pronounced than on the prior exam.  The right frontal ventriculostomy catheter is now in place. There is minimal hemorrhage along the shunt site.  Subarachnoid blood over the left parietal convexity is again noted.  There is no significant hydrocephalus.  The paranasal sinuses and mastoid air cells are clear.  The osseous skull is otherwise intact.  The postcontrast images demonstrate no pathologic enhancement.  CTA images demonstrate atherosclerotic calcifications within the cavernous carotid arteries bilaterally.  Enlarged empty sella is noted.  No significant vascular stenoses are evident.  The right A1 segment is dominant.  The anterior communicating artery is patent. The MCA bifurcations are within normal limits bilaterally.  The ACA and MCA branch vessels are normal.  No significant vascular lesions are associated with the area of hemorrhage.  The vertebral arteries are codominant.  The basilar artery is within normal limits.  Prominent posterior communicating arteries are present bilaterally.  P1 segments contribute as well, more equally on the left.  There is some irregularity of distal PCA branch vessels.  The dural sinuses are patent.   Review of the MIP images confirms the above findings.  IMPRESSION:  1.  Minimal distal small vessel disease. 2.  No significant vascular lesion associated with the area of hemorrhage. 3.  Stable right parietal parenchymal  hemorrhage. 4.  Status post right to the tracheostomy placement without evidence for hydrocephalus. 5.  The extent of intraventricular hemorrhage has increased.   Original Report Authenticated By: Marin Roberts, M.D.   Ct Head Wo Contrast  07/06/2013   *RADIOLOGY REPORT*  Clinical Data:  Hypertension, severe headache  CT HEAD WITHOUT CONTRAST  Technique:  Contiguous axial images were obtained from the base of the skull through the vertex without contrast.  Comparison: 12/23/2011  Findings:  Dilatation of the lateral ventricles. Large intracerebral hematoma identified at the right posterior parietotemporal region, 4.4 x 3.1 cm image 17. Evidence of intraventricular extension of hemorrhage into the right lateral ventricle, third ventricle and minimally into frontal horn of the left lateral ventricle. Edema identified surrounding the intraparenchymal hemorrhage in the right hemisphere. Question small amount of subarachnoid hemorrhage at the high posterior right parietal region.  Mild diffuse effacement of sulci. 6 mm of right-to-left midline shift. No additional areas of hemorrhage, mass or acute infarction. Scattered falx calcifications. Bones and sinuses unremarkable.  IMPRESSION: Large intracerebral hematoma at the posterior right parietotemporal lobe, 4.1 x 3.1 cm in size with surrounding edema and evidence of intraventricular extension of hemorrhage. Question minimal subarachnoid blood at high posterior right parietal region. 6 mm of right-to-left midline shift and diffuse sulcal effacement noted.  Critical Value/emergent results were called by telephone at the time of interpretation on 07/06/2013 at 0125 hours to Dr. Lavella Lemons, who verbally acknowledged these results.   Original Report Authenticated By: Ulyses Southward, M.D.   Dg Chest Port 1 View  07/07/2013   *RADIOLOGY REPORT*  Clinical Data: Left IJ line placement.  PORTABLE CHEST - 1 VIEW  Comparison: 07/07/2013  Findings: Left IJ line has been placed, tip overlying the level of the brachycephalic vein - SVC confluence.  No evidence for pneumothorax.  Film is made with shallow lung inflation.  Heart is enlarged.  There is mild pulmonary vascular congestion. Degenerative changes are seen in the spine.  IMPRESSION:  1.  Interval placement of left IJ line, tip overlying the level of the superior vena cava - SVC confluence. 2.  No postprocedure pneumothorax. 3.  Cardiomegaly and vascular congestion.   Original Report Authenticated By: Norva Pavlov,  M.D.   Dg Chest Port 1 View  07/07/2013   *RADIOLOGY REPORT*  Clinical Data: Pneumonia  PORTABLE CHEST - 1 VIEW  Comparison: Prior chest x-ray 07/06/2013  Findings: Increased pulmonary vascular congestion now with mild interstitial edema.  Stable cardiomegaly.  No focal airspace consolidation.  No pneumothorax.  No acute osseous abnormality.  IMPRESSION:  Increased pulmonary vascular congestion now with mild pulmonary edema.  Stable cardiomegaly.   Original Report Authenticated By: Malachy Moan, M.D.   Dg Chest Port 1 View  07/06/2013   *RADIOLOGY REPORT*  Clinical Data: Headaches.  Concern for bleed.  PORTABLE CHEST - 1 VIEW  Comparison: 06/09/2009  Findings: Shallow inspiration.  Mild cardiac enlargement. Pulmonary vascularity is normal for technique.  No focal consolidation or airspace disease.  No blunting of costophrenic angles.  No pneumothorax.  No significant change since previous study, allowing for technical differences.  IMPRESSION: Shallow inspiration.  Mild cardiac enlargement.  No evidence of active pulmonary disease.   Original Report Authenticated By: Burman Nieves, M.D.    Assessment/Plan: Right intracerebral hemorrhage, interventricular hemorrhage, hydrocephalus: I discussed the situation with the patient's 2 daughters and 2 sisters. We have discussed the various treatment options including continued observation, injecting intra ventricular tPA, and placement of a left frontal ventriculostomy.  I favor the latter as the patient only seems to be slowly worsening with observation and I'm reluctant to use TPA because I don't think that will work and because of patient's recent bleed. I explained the procedure of placement of a left frontal ventriculostomy. We discussed the risks including risks of infection, hemorrhage, catheter malplacement malfunction, etc. I have answered all her questions. They have consented on behalf of the patient.  LOS: 1 day     Marcel Gary D 07/07/2013,  5:07 PM

## 2013-07-07 NOTE — Op Note (Signed)
Brief history: The patient is a 52 year old black female who presented a couple days ago with a right intracerebral hemorrhage, intraventricular hemorrhage, and hydrocephalus. I placed a bit tracheostomy on the right while in the emergency department. The ventriculostomy at initially drained well but then he came occluded. I discussed situation with the patient's family and recommend placement of a left frontal ventriculostomy. I explained the procedure, the risks, benefits, the alternatives and answered all her questions. They have consented on behalf of the patient.  Diagnosis: Right intracerebral hemorrhage, hydrocephalus, intraventricular hemorrhage  Postop diagnosis: The same  Procedure: Placement of left frontal ventriculostomy via a burr hole and removal of right ventriculostomy  Surgeon: Dr. Delma Officer  Asst.: None  Anesthesia: MAC  Estimated blood loss: Minimal  Specimens: None  Complications: None  Drains: One ventriculostomy  Description of procedure: The patient was brought to the operating room by the anesthesia team. MAC anesthesia was induced. The patient's left scalp was shaved with clippers and prepared with DuraPrep. Sterile drapes were applied. The area to be incised was then injected with Marcaine with epinephrine solution. I used a scalpel to make an incision in the left mid pupillary line at approximately the coronal suture. It is Industrial/product designer for exposure. I then used a high-speed drill to create a left frontal burr hole. I coagulated the underlying dura with a ball electrocautery. I incise the dura with a 15 blade scalpel and coagulated the peel surface of the brain with the bipolar electrocautery. I then cannulated the left frontal horn of the ventricle the first pass, inserting the ventriculostomy. I threaded and the tracheostomy a couple centimeters deeper once the stylette was removed. There was good flow of CSF under some pressure. I then tunneled the  ventriculostomy out through a separate stab wound. Connected the ventriculostomy to drainage system. I then removed the retractors and reapproximated patient's scalp with a running 2-0 nylon suture. We secured the ventriculostomy at several points on the scalp using 2-0 nylon suture. A sterile dressing was applied. The drapes were removed.  Subsequently removed the patient's right ventriculostomy and stapled the exit site.  The patient tolerated procedure well and was escorted back to the ICU in stable condition.

## 2013-07-07 NOTE — Evaluation (Signed)
Occupational Therapy Evaluation Patient Details Name: Alison Ward MRN: [id number] DOB: 1961/06/25 Today's Date: 07/07/2013 Time: 4098-1191 OT Time Calculation (min): 29 min  OT Assessment / Plan / Recommendation History of present illness Alison Ward is an 52 y.o. female with a history of hypertension and poor compliance with taking medication, presenting with confusion and headache as well as lethargy. Patient was confused and called family members to get directions home. She was noted to be outside of the home but unknown resident. A pedestrian called EMS. Emergency services subsequently located family members. Blood pressure on arrival in the emergency room was 202/98. CT scan of her head showed a 4.1 x 3.1 cm parietotemporal intracerebral infarction with surrounding edema and intraventricular extension of hemorrhage. Patient had a 6 mm right to left midline shift. There is no previous history of stroke nor TIA. Patient has not been on antiplatelet therapy. NIH stroke score was 20. 75 F with ICH and elavated ICP   Clinical Impression   Pt admitted with. Pt currently with functional limitations due to the deficits listed below (see OT Problem List).  Pt will benefit from skilled OT to increase their safety and independence with ADL and functional mobility for ADL to facilitate discharge to venue listed below.       OT Assessment  Patient needs continued OT Services    Follow Up Recommendations  CIR       Equipment Recommendations  3 in 1 bedside comode       Frequency  Min 2X/week    Precautions / Restrictions Precautions Precautions: Fall Restrictions Weight Bearing Restrictions: No       ADL  Eating/Feeding: NPO Where Assessed - Eating/Feeding: Edge of bed Grooming: Moderate assistance Where Assessed - Grooming: Unsupported sitting Upper Body Bathing: Maximal assistance Where Assessed - Upper Body Bathing: Supported sitting Lower Body Bathing: +1 Total  assistance Where Assessed - Lower Body Bathing: Supported sit to stand Upper Body Dressing: +1 Total assistance Where Assessed - Upper Body Dressing: Unsupported sitting Lower Body Dressing: +1 Total assistance Where Assessed - Lower Body Dressing: Supported sit to Pharmacist, hospital: +2 Total assistance Toilet Transfer: Patient Percentage: 30% (partial stand to scoot up in bed while sitting EOB) Transfers/Ambulation Related to ADLs: total A +2 (pt=30%) partial sit<>stand    OT Diagnosis: Generalized weakness;Cognitive deficits;Disturbance of vision  OT Problem List: Decreased strength;Decreased activity tolerance;Impaired balance (sitting and/or standing);Decreased cognition;Impaired vision/perception;Obesity;Impaired UE functional use OT Treatment Interventions: Self-care/ADL training;Balance training;Therapeutic activities;DME and/or AE instruction;Patient/family education;Visual/perceptual remediation/compensation;Cognitive remediation/compensation   OT Goals(Current goals can be found in the care plan section) Acute Rehab OT Goals Patient Stated Goal: to go home OT Goal Formulation: With patient Time For Goal Achievement: 07/21/13 Potential to Achieve Goals: Good  Visit Information  Last OT Received On: 07/07/13 Assistance Needed: +2 PT/OT Co-Evaluation/Treatment: Yes History of Present Illness: Alison Ward is an 52 y.o. female with a history of hypertension and poor compliance with taking medication, presenting with confusion and headache as well as lethargy. Patient was confused and called family members to get directions home. She was noted to be outside of the home but unknown resident. A pedestrian called EMS. Emergency services subsequently located family members. Blood pressure on arrival in the emergency room was 202/98. CT scan of her head showed a 4.1 x 3.1 cm parietotemporal intracerebral infarction with surrounding edema and intraventricular extension of hemorrhage.  Patient had a 6 mm right to left midline shift. There is no previous history of  stroke nor TIA. Patient has not been on antiplatelet therapy. NIH stroke score was 20. 39 F with ICH and elavated ICP       Prior Functioning     Home Living Family/patient expects to be discharged to:: Private residence Living Arrangements: Alone Available Help at Discharge: Family;Available 24 hours/day Type of Home: Apartment Home Access: Stairs to enter [employer] of Steps: 3 down with right rail and then 6 with both rails Entrance Stairs-Rails:  (see above) Home Layout: One level Home Equipment: None Additional Comments: administrative job at work Prior Function Level of Independence: Independent Comments: tub with curtain and standard toilet Communication Communication: No difficulties Dominant Hand: Right         Vision/Perception Vision - History Baseline Vision: No visual deficits Patient Visual Report: No change from baseline Vision - Assessment Additional Comments: Need to assess further   Cognition  Cognition Arousal/Alertness: Lethargic Behavior During Therapy: Flat affect Overall Cognitive Status: Impaired/Different from baseline Area of Impairment: Orientation;Attention;Memory;Following commands;Safety/judgement;Awareness;Problem solving Orientation Level: Disoriented to;Place;Time;Situation Current Attention Level: Focused Following Commands: Follows one step commands inconsistently;Follows one step commands with increased time Safety/Judgement: Decreased awareness of safety;Decreased awareness of deficits Problem Solving: Slow processing;Decreased initiation;Difficulty sequencing;Requires verbal cues;Requires tactile cues General Comments: Pt easily distracted from tasks.      Extremity/Trunk Assessment Upper Extremity Assessment Upper Extremity Assessment: Generalized weakness Lower Extremity Assessment Lower Extremity Assessment: Generalized weakness      Mobility Bed Mobility Bed Mobility: Rolling Left;Left Sidelying to Sit;Sitting - Scoot to Edge of Bed Rolling Left: 1: +2 Total assist Rolling Left: Patient Percentage: 50% Left Sidelying to Sit: 1: +2 Total assist;HOB elevated Left Sidelying to Sit: Patient Percentage: 50% Sitting - Scoot to Edge of Bed: 3: Mod assist Details for Bed Mobility Assistance: Pt needed asssit to move LEs off bed and to elevate trunk Transfers Details for Transfer Assistance: Pt able to perform scoot up to [facility] with 2 person assist and cues.          Balance Balance Balance Assessed: Yes Static Sitting Balance Static Sitting - Balance Support: Bilateral upper extremity supported;Feet supported Static Sitting - Level of Assistance: 3: Mod assist;2: Max assist   End of Session OT - End of Session Activity Tolerance: Patient limited by fatigue Patient left: in bed;with call bell/phone within reach;with family/visitor present;with nursing/sitter in room       Evette Georges [phone] 07/07/2013, 1:48 PM

## 2013-07-07 NOTE — Progress Notes (Signed)
Utilization review completed.  P.J. Kellsey Sansone,RN,BSN Case Manager [phone]  

## 2013-07-07 NOTE — Progress Notes (Signed)
[facility] Nursing ICU Electrolyte Replacement Protocol  Patient Name: Alison Ward DOB: 1960-11-14 MRN: [id number]  Date of Service  07/07/2013   HPI/Events of Note    Recent Labs Lab 07/06/13 0211 07/06/13 1530 07/07/13 0345  NA 138 137 136  K 2.9* 3.4* 3.4*  CL 101 97 99  CO2 26 23 25   GLUCOSE 157* 125* 112*  BUN 11 10 12   CREATININE 0.69 0.61 0.69  CALCIUM 8.9 9.4 9.3    Estimated Creatinine Clearance: 102.4 ml/min (by C-G formula based on Cr of 0.69).  Intake/Output     09/07 0701 - 09/08 0700   I.V. (mL/kg) 1223.3 (10.8)   IV Piggyback 660   Total Intake(mL/kg) 1883.3 (16.7)   Urine (mL/kg/hr) 3300 (1.2)   Emesis/NG output 1 (0)   Drains 1 (0)   Total Output 3302   Net -1418.7        - I/O DETAILED x24h    Total I/O In: 535 [I.V.:435; IV Piggyback:100] Out: 1750 [Urine:1750] - I/O THIS SHIFT    ASSESSMENT   eICURN Interventions  K+ 3.4 Replaced per protocol. MD notified   ASSESSMENT: MAJOR ELECTROLYTE    Merita Norton 07/07/2013, 6:07 AM

## 2013-07-07 NOTE — Progress Notes (Signed)
Dr. Roseanne Reno paged to report Sodium level of 142. Ordered to keep 3% Saline at 75 ml/hr. Will draw another sodium at 0245. Will continue to monitor. Alison Ward

## 2013-07-07 NOTE — Progress Notes (Signed)
IVC not working; Dr. Lovell Sheehan notified. No further orders at this time.   Alison Ward

## 2013-07-07 NOTE — Procedures (Signed)
Central Venous Catheter Insertion Procedure Note Alison Ward [id number] 04-02-1961  Procedure: Insertion of Central Venous Catheter Indications: Assessment of intravascular volume, Drug and/or fluid administration and Frequent blood sampling  Procedure Details Consent: Risks of procedure as well as the alternatives and risks of each were explained to the (patient/caregiver).  Consent for procedure obtained. Time Out: Verified patient identification, verified procedure, site/side was marked, verified correct patient position, special equipment/implants available, medications/allergies/relevent history reviewed, required imaging and test results available.  Performed  Maximum sterile technique was used including antiseptics, cap, gloves, gown, hand hygiene, mask and sheet. Skin prep: Chlorhexidine; local anesthetic administered A antimicrobial bonded/coated triple lumen catheter was placed in the left internal jugular vein to 18 cm using the Seldinger technique.  Evaluation Blood flow good Complications: No apparent complications Patient did tolerate procedure well. Chest X-ray ordered to verify placement.  CXR: pending.   Procedure performed under direct supervision of Dr. Sung Amabile and with ultrasound guidance for real time vessel cannulation.     Canary Brim, NP-C [facility] Pgr: [phone] or [phone]    07/07/2013, 2:49 PM  I was present for and supervised the entire procedure  Billy Fischer, MD ; [facility] service Mobile [phone].  After 5:30 PM or weekends, call [phone]

## 2013-07-07 NOTE — Progress Notes (Signed)
PULMONARY  / CRITICAL CARE MEDICINE  Name: Alison Ward MRN: [id number] DOB: 09/07/1961    ADMISSION DATE:  07/06/2013 CONSULTATION DATE:  07/06/2013  REFERRING MD :  Dr. Lavella Lemons PRIMARY SERVICE: CCM  CHIEF COMPLAINT:  AMS  BRIEF PATIENT DESCRIPTION: 74 F with ICH and elevated ICP.  SIGNIFICANT EVENTS / STUDIES:  1. ICH 07/06/2013  LINES / TUBES: 1. PIV  CULTURES: 1. None  ANTIBIOTICS: Cefazolin 9/7 >>   HISTORY OF PRESENT ILLNESS:  Alison Ward is a 56 F with HTN who was BIBA for AMS. Patient is currently unable to provide any history; her two daughters are here with her. She complained of headaches earlier in the day today but later told her daughters that her headache had improved. She called her daughter several hours later complaining that she was lost. A nearby pedestrian called 911 and emergency services was able to locate her daughters.    PHYSICAL EXAM  VITAL SIGNS: Temp:  [97.2 F (36.2 C)-99 F (37.2 C)] 98.9 F (37.2 C) (09/08 0800) Pulse Rate:  [85-128] 89 (09/08 1100) Resp:  [14-35] 23 (09/08 1100) BP: (133-198)/(70-118) 175/94 mmHg (09/08 1100) SpO2:  [92 %-100 %] 95 % (09/08 1100) Weight:  [111.5 kg (245 lb 13 oz)] 111.5 kg (245 lb 13 oz) (09/08 0500)  HEMODYNAMICS:    VENTILATOR SETTINGS:    INTAKE / OUTPUT: Intake/Output     09/07 0701 - 09/08 0700 09/08 0701 - 09/09 0700   I.V. (mL/kg) 1298.3 (11.6) 81.3 (0.7)   Other 0 40   IV Piggyback 765 200   Total Intake(mL/kg) 2063.3 (18.5) 321.3 (2.9)   Urine (mL/kg/hr) 3300 (1.2) 650 (1.3)   Emesis/NG output 1 (0)    Drains 2 (0) 2 (0)   Total Output 3303 652   Net -1239.7 -330.8          PHYSICAL EXAMINATION: General:  Obese F in NAD Neuro:  Arousable to verbal stimuli, GCS 10 HEENT:  Sclera anicteric, pupil equal and reactive bilaterally, MMM, OP clear Neck:  Trachea supple and midline, (-) JVD, (-) LAN Cardiovascular:  RRR, NS1/S2, (-) MRG Lungs:  CTAB Abdomen:   S/NT/ND/(+)BS Musculoskeletal:  (-) C/C/E, OA toes biltaerally Skin:  Intact  LABS:  CBC Recent Labs     07/06/13  0210  07/07/13  0345  WBC  9.1  14.9*  HGB  13.1  13.3  HCT  38.8  39.4  PLT  215  217    Coag's Recent Labs     07/06/13  0210  APTT  27  INR  0.95    BMET Recent Labs     07/06/13  0211  07/06/13  1530  07/07/13  0345  NA  138  137  136  K  2.9*  3.4*  3.4*  CL  101  97  99  CO2  26  23  25   BUN  11  10  12   CREATININE  0.69  0.61  0.69  GLUCOSE  157*  125*  112*    Electrolytes Recent Labs     07/06/13  0211  07/06/13  1530  07/07/13  0345  CALCIUM  8.9  9.4  9.3    Sepsis Markers No results found for this basename: LACTICACIDVEN, PROCALCITON, O2SATVEN,  in the last 72 hours  ABG No results found for this basename: PHART, PCO2ART, PO2ART,  in the last 72 hours  Liver Enzymes No results found for this basename: AST, ALT, ALKPHOS, BILITOT, ALBUMIN,  in the last 72 hours  Cardiac Enzymes No results found for this basename: TROPONINI, PROBNP,  in the last 72 hours  Glucose Recent Labs     07/06/13  0057  GLUCAP  156*    Imaging Ct Angio Head W/cm &/or Wo Cm  07/07/2013   *RADIOLOGY REPORT*  Clinical Data:  Intracranial hemorrhage.  CT ANGIOGRAPHY HEAD  Technique:  Multidetector CT imaging of the head was performed using the standard protocol during bolus administration of intravenous contrast.  Multiplanar CT image reconstructions including MIPs were obtained to evaluate the vascular anatomy.  Contrast: 50mL OMNIPAQUE IOHEXOL 350 MG/ML SOLN  Comparison:  CT head without contrast 07/06/2013.  Findings:  A right parietal and temporal lobe hemorrhage is not significantly changed in size surrounding edema is as expected. Intraventricular hemorrhage is more pronounced than on the prior exam.  The right frontal ventriculostomy catheter is now in place. There is minimal hemorrhage along the shunt site.  Subarachnoid blood over the left  parietal convexity is again noted.  There is no significant hydrocephalus.  The paranasal sinuses and mastoid air cells are clear.  The osseous skull is otherwise intact.  The postcontrast images demonstrate no pathologic enhancement.  CTA images demonstrate atherosclerotic calcifications within the cavernous carotid arteries bilaterally.  Enlarged empty sella is noted.  No significant vascular stenoses are evident.  The right A1 segment is dominant.  The anterior communicating artery is patent. The MCA bifurcations are within normal limits bilaterally.  The ACA and MCA branch vessels are normal.  No significant vascular lesions are associated with the area of hemorrhage.  The vertebral arteries are codominant.  The basilar artery is within normal limits.  Prominent posterior communicating arteries are present bilaterally.  P1 segments contribute as well, more equally on the left.  There is some irregularity of distal PCA branch vessels.  The dural sinuses are patent.   Review of the MIP images confirms the above findings.  IMPRESSION:  1.  Minimal distal small vessel disease. 2.  No significant vascular lesion associated with the area of hemorrhage. 3.  Stable right parietal parenchymal hemorrhage. 4.  Status post right to the tracheostomy placement without evidence for hydrocephalus. 5.  The extent of intraventricular hemorrhage has increased.   Original Report Authenticated By: Marin Roberts, M.D.   Ct Head Wo Contrast  07/06/2013   *RADIOLOGY REPORT*  Clinical Data:  Hypertension, severe headache  CT HEAD WITHOUT CONTRAST  Technique:  Contiguous axial images were obtained from the base of the skull through the vertex without contrast.  Comparison: 12/23/2011  Findings: Dilatation of the lateral ventricles. Large intracerebral hematoma identified at the right posterior parietotemporal region, 4.4 x 3.1 cm image 17. Evidence of intraventricular extension of hemorrhage into the right lateral ventricle,  third ventricle and minimally into frontal horn of the left lateral ventricle. Edema identified surrounding the intraparenchymal hemorrhage in the right hemisphere. Question small amount of subarachnoid hemorrhage at the high posterior right parietal region.  Mild diffuse effacement of sulci. 6 mm of right-to-left midline shift. No additional areas of hemorrhage, mass or acute infarction. Scattered falx calcifications. Bones and sinuses unremarkable.  IMPRESSION: Large intracerebral hematoma at the posterior right parietotemporal lobe, 4.1 x 3.1 cm in size with surrounding edema and evidence of intraventricular extension of hemorrhage. Question minimal subarachnoid blood at high posterior right parietal region. 6 mm of right-to-left midline shift and diffuse sulcal effacement noted.  Critical Value/emergent results were called by telephone at the time of interpretation  on 07/06/2013 at 0125 hours to Dr. Lavella Lemons, who verbally acknowledged these results.   Original Report Authenticated By: Ulyses Southward, M.D.   Dg Chest Port 1 View  07/07/2013   *RADIOLOGY REPORT*  Clinical Data: Pneumonia  PORTABLE CHEST - 1 VIEW  Comparison: Prior chest x-ray 07/06/2013  Findings: Increased pulmonary vascular congestion now with mild interstitial edema.  Stable cardiomegaly.  No focal airspace consolidation.  No pneumothorax.  No acute osseous abnormality.  IMPRESSION:  Increased pulmonary vascular congestion now with mild pulmonary edema.  Stable cardiomegaly.   Original Report Authenticated By: Malachy Moan, M.D.   Dg Chest Port 1 View  07/06/2013   *RADIOLOGY REPORT*  Clinical Data: Headaches.  Concern for bleed.  PORTABLE CHEST - 1 VIEW  Comparison: 06/09/2009  Findings: Shallow inspiration.  Mild cardiac enlargement. Pulmonary vascularity is normal for technique.  No focal consolidation or airspace disease.  No blunting of costophrenic angles.  No pneumothorax.  No significant change since previous study, allowing for  technical differences.  IMPRESSION: Shallow inspiration.  Mild cardiac enlargement.  No evidence of active pulmonary disease.   Original Report Authenticated By: Burman Nieves, M.D.    EKG: Personally reviewed, Sinus. TWI V4-V6 CXR: Personally reviewed. Rotated. Clear.   ASSESSMENT / PLAN: Principal Problem:   Intracerebral hemorrhage Active Problems:   HYPERTENSION   PULMONARY A:  1. No active issues  P:    Follow airway protection, secretion management closely  CARDIOVASCULAR A:  1. HTN: Intrinsic + reactive. Goal SBP <= 140  P:   Nicardipine Drip, wean to off as able based on goals   RENAL A:  1. No Acute Issues  P:    F/u BMP  GASTROINTESTINAL A:  No acute issues  HEMATOLOGIC A:   1. No Acute Issues  INFECTIOUS A: Cefazolin 9/7  ENDOCRINE A:   1. No Acute Issues  NEUROLOGIC A:  1. Intracerebral Hemorrhage 2. Intraventricular blood and enlargement s/p ventriculostomy  P:    Plan for t-PA to ventric to see if we can get it draining again 9/8. If unable then options include removal and watching clinically and radiographically vs replacement new ventric.   If we decide to leave the ventric out, then will start 3% saline protocol. PICC ordered to facilitate this  Valproate   Serial Neuro Exams; if GCS drops will intubate for airway protection  Discussed status and plans with daughters at bedside.   TODAY'S SUMMARY:   I have personally obtained a history, examined the patient, evaluated laboratory and imaging results, formulated the assessment and plan and placed orders.  CRITICAL CARE: The patient is critically ill with multiple organ systems failure and requires high complexity decision making for assessment and support, frequent evaluation and titration of therapies, application of advanced monitoring technologies and extensive interpretation of multiple databases. Critical Care Time devoted to patient care services described in this note  is 30 minutes.    Levy Pupa, MD, PhD 07/07/2013, 11:43 AM [facility] [phone] or if no answer [phone]

## 2013-07-07 NOTE — Transfer of Care (Signed)
Immediate Anesthesia Transfer of Care Note  Patient: Alison Ward  Procedure(s) Performed: Procedure(s) with comments: VENTRICULOSTOMY (Left) - Left Ventriculostomy and Removal of Right Vetriculostomy  Patient Location: NICU  Anesthesia Type:MAC  Level of Consciousness: sedated  Airway & Oxygen Therapy: Patient Spontanous Breathing and Patient connected to nasal cannula oxygen  Post-op Assessment: Report given to PACU RN and Post -op Vital signs reviewed and stable  Post vital signs: Reviewed and stable  Complications: No apparent anesthesia complications

## 2013-07-07 NOTE — Progress Notes (Signed)
Stroke Team Progress Note  HISTORY Alison Ward is an 52 y.o. female with a history of hypertension and poor compliance with taking medication, presenting with confusion and headache as well as lethargy. Patient was confused and called family members to get directions home. She was noted to be outside of the home but unknown resident. A pedestrian called EMS. Emergency services subsequently located family members. Blood pressure on arrival in the emergency room was 202/98. CT scan of her head showed a 4.1 x 3.1 cm parietotemporal intracerebral infarction with surrounding edema and intraventricular extension of hemorrhage. Patient had a 6 mm right to left midline shift. There is no previous history of stroke nor TIA. Patient has not been on antiplatelet therapy. NIH stroke score was 20.  LSN: Unclear  tPA Given: No: ICH  MRankin: 4  Patient was not a TPA candidate secondary to ICH  She was admitted to the neuro ICU for further evaluation and treatment.  SUBJECTIVE  Patient lethargic. Was given ativan last night for agitation. Nonfunctioning ventric.CT angio shows no aneurysm/AVm and ventricular size and hematoma is stable.    OBJECTIVE Most recent Vital Signs: Filed Vitals:   07/07/13 0630 07/07/13 0645 07/07/13 0700 07/07/13 0800  BP: 157/85 159/83 137/75 151/96  Pulse: 102 103 103 111  Temp:    98.9 F (37.2 C)  TempSrc:      Resp: 26 28 28 25   Height:      Weight:      SpO2: 99% 98% 98% 98%   CBG (last 3)  No results found for this basename: GLUCAP,  in the last 72 hours  IV Fluid Intake:   . niCARDipine 2.5 mg/hr (07/07/13 0717)    MEDICATIONS  .  ceFAZolin (ANCEF) IV  2 g Intravenous Q8H  . fentaNYL      . LORazepam      . pantoprazole (PROTONIX) IV  40 mg Intravenous QHS  . potassium chloride  10 mEq Intravenous Q1 Hr x 2  . senna-docusate  1 tablet Oral BID  . valproate sodium  500 mg Intravenous Q8H   PRN:  acetaminophen, acetaminophen, LORazepam,  ondansetron  Diet:    n.p.o. Activity:  Bedrest DVT Prophylaxis:  SCDs CLINICALLY SIGNIFICANT STUDIES Basic Metabolic Panel:   Recent Labs Lab 07/06/13 1530 07/07/13 0345  NA 137 136  K 3.4* 3.4*  CL 97 99  CO2 23 25  GLUCOSE 125* 112*  BUN 10 12  CREATININE 0.61 0.69  CALCIUM 9.4 9.3   Liver Function Tests: No results found for this basename: AST, ALT, ALKPHOS, BILITOT, PROT, ALBUMIN,  in the last 168 hours CBC:   Recent Labs Lab 07/06/13 0210 07/07/13 0345  WBC 9.1 14.9*  HGB 13.1 13.3  HCT 38.8 39.4  MCV 83.8 82.9  PLT 215 217   Coagulation:   Recent Labs Lab 07/06/13 0210  LABPROT 12.5  INR 0.95   Cardiac Enzymes: No results found for this basename: CKTOTAL, CKMB, CKMBINDEX, TROPONINI,  in the last 168 hours Urinalysis:   Recent Labs Lab 07/06/13 0359  COLORURINE YELLOW  LABSPEC 1.009  PHURINE 8.0  GLUCOSEU 100*  HGBUR NEGATIVE  BILIRUBINUR NEGATIVE  KETONESUR NEGATIVE  PROTEINUR NEGATIVE  UROBILINOGEN 0.2  NITRITE NEGATIVE  LEUKOCYTESUR NEGATIVE   Lipid Panel    Component Value Date/Time   CHOL 244* 07/06/2013 0420   TRIG 153* 07/06/2013 0420   HDL 45 07/06/2013 0420   CHOLHDL 5.4 07/06/2013 0420   VLDL 31 07/06/2013 0420  LDLCALC 168* 07/06/2013 0420   HgbA1C  Lab Results  Component Value Date   HGBA1C 6.0* 07/06/2013    Urine Drug Screen:   No results found for this basename: labopia,  cocainscrnur,  labbenz,  amphetmu,  thcu,  labbarb    Alcohol Level: No results found for this basename: ETH,  in the last 168 hours  Ct Head Wo Contrast  07/06/2013   *RADIOLOGY REPORT*  Clinical Data:  Hypertension, severe headache  CT HEAD WITHOUT CONTRAST  Technique:  Contiguous axial images were obtained from the base of the skull through the vertex without contrast.  Comparison: 12/23/2011  Findings: Dilatation of the lateral ventricles. Large intracerebral hematoma identified at the right posterior parietotemporal region, 4.4 x 3.1 cm image 17. Evidence  of intraventricular extension of hemorrhage into the right lateral ventricle, third ventricle and minimally into frontal horn of the left lateral ventricle. Edema identified surrounding the intraparenchymal hemorrhage in the right hemisphere. Question small amount of subarachnoid hemorrhage at the high posterior right parietal region.  Mild diffuse effacement of sulci. 6 mm of right-to-left midline shift. No additional areas of hemorrhage, mass or acute infarction. Scattered falx calcifications. Bones and sinuses unremarkable.  IMPRESSION: Large intracerebral hematoma at the posterior right parietotemporal lobe, 4.1 x 3.1 cm in size with surrounding edema and evidence of intraventricular extension of hemorrhage. Question minimal subarachnoid blood at high posterior right parietal region. 6 mm of right-to-left midline shift and diffuse sulcal effacement noted.  Critical Value/emergent results were called by telephone at the time of interpretation on 07/06/2013 at 0125 hours to Dr. Lavella Lemons, who verbally acknowledged these results.   Original Report Authenticated By: Ulyses Southward, M.D.   Dg Chest Port 1 View  07/07/2013   *RADIOLOGY REPORT*  Clinical Data: Pneumonia  PORTABLE CHEST - 1 VIEW  Comparison: Prior chest x-ray 07/06/2013  Findings: Increased pulmonary vascular congestion now with mild interstitial edema.  Stable cardiomegaly.  No focal airspace consolidation.  No pneumothorax.  No acute osseous abnormality.  IMPRESSION:  Increased pulmonary vascular congestion now with mild pulmonary edema.  Stable cardiomegaly.   Original Report Authenticated By: Malachy Moan, M.D.   Dg Chest Port 1 View  07/06/2013   *RADIOLOGY REPORT*  Clinical Data: Headaches.  Concern for bleed.  PORTABLE CHEST - 1 VIEW  Comparison: 06/09/2009  Findings: Shallow inspiration.  Mild cardiac enlargement. Pulmonary vascularity is normal for technique.  No focal consolidation or airspace disease.  No blunting of costophrenic angles.  No  pneumothorax.  No significant change since previous study, allowing for technical differences.  IMPRESSION: Shallow inspiration.  Mild cardiac enlargement.  No evidence of active pulmonary disease.   Original Report Authenticated By: Burman Nieves, M.D.    MRI of the brain  pending  MRA of the brain  pending  2D Echocardiogram  pending  Carotid Doppler  Not ordered  CXR 07/06/13 Shallow inspiration. Mild cardiac enlargement. No evidence of  active pulmonary disease.    EKG Sinus rhythm Probable LVH with secondary repol abnrm   Therapy Recommendations pending Physical Exam   Obese middle aged african american lady not in distress.Awake alert. Afebrile. Head is nontraumatic. Neck is supple without bruit. Hearing is normal. Cardiac exam no murmur or gallop. Lungs are clear to auscultation. Distal pulses are well felt. She has a right frontal ventriculostomy catheter Neurological Exam :  Drowsy but can be aroused easily. No gaze deviation. Pupils equal reactive. Decrease blink to threat on the left. Disoriented to time and place.  Diminished attention, registration and recall. Speech is clear. Follows commands well. Mild left lower facial weakness. Motor system exam reveals no upper or lower rectum to drift but patient's cooperation is limited. I suspect mild left grip and hip flexor and ankle dorsiflexor weakness. Reflexes are symmetric. Left plantar is equivocal and right is downgoing. Gait was not tested.    ASSESSMENT Alison Ward is a 52 y.o. female presenting with  Headache, confusion and vision changes secondary to large right pareital parenchymal hemorrhage with intraventricular extension mild cytotoxic edema and hydrocephalous-etiology likely hypertensive with accelerated hypertension. Ct head unchanged in hemorrhage or ventricles; therefore awaiting pull out.  Hospital day # 1  TREATMENT/PLAN  Continue strict control of hypertension with blood pressure goal below 180  later. Continue Cardene drip for now.  IV Depacon for headache.  Physical, occupational and speech therapy consults. Ultimately CIR planned  Hypertonic saline protocol  PICC ordered, but patient eventually got central line. 3% saline, as patient remains somnolent. If not better, Dr. Lovell Sheehan considering left frontal ventriculostomy versus intraventricular TPA  Dr. Pearlean Brownie discussed the patients plan of care with family, CCM.  This patient is critically ill and at significant risk of neurological worsening, death and care requires constant monitoring of vital signs, hemodynamics,respiratory and cardiac monitoring,review of multiple databases, neurological assessment, discussion with family, other specialists and medical decision making of high complexity. I spent 35 minutes of neurocritical care time  in the care of  this patient.  Gwendolyn Lima. Manson Passey, [facility], MBA, MHA [facility] Pager: [phone] 07/07/2013 3:56 PM   I have personally obtained a history, examined the patient, evaluated imaging results, and formulated the assessment and plan of care. I agree with the above.  Delia Heady, MD

## 2013-07-08 ENCOUNTER — Encounter (HOSPITAL_COMMUNITY): Payer: Self-pay | Admitting: Neurosurgery

## 2013-07-08 ENCOUNTER — Inpatient Hospital Stay (HOSPITAL_COMMUNITY): Payer: Self-pay

## 2013-07-08 LAB — SODIUM: Sodium: 146 mEq/L — ABNORMAL HIGH (ref 135–145)

## 2013-07-08 MED ORDER — WHITE PETROLATUM GEL
Status: AC
  Administered 2013-07-08: 0.2
  Filled 2013-07-08: qty 5

## 2013-07-08 NOTE — Anesthesia Postprocedure Evaluation (Signed)
  Anesthesia Post-op Note  Patient: Alison Ward  Procedure(s) Performed: Procedure(s) with comments: VENTRICULOSTOMY (Left) - Left Ventriculostomy and Removal of Right Vetriculostomy  Patient Location: ICU  Anesthesia Type:MAC  Level of Consciousness: awake  Airway and Oxygen Therapy: Patient Spontanous Breathing and Patient connected to face mask oxygen  Post-op Pain: none  Post-op Assessment: Post-op Vital signs reviewed  Post-op Vital Signs: Reviewed  Complications: No apparent anesthesia complications

## 2013-07-08 NOTE — Progress Notes (Signed)
PULMONARY  / CRITICAL CARE MEDICINE  Name: Alison Ward MRN: [id number] DOB: [birth year]    ADMISSION DATE:  07/06/2013 CONSULTATION DATE:  07/06/2013  REFERRING MD :  Dr. Lavella Lemons PRIMARY SERVICE: CCM  CHIEF COMPLAINT:  AMS  BRIEF PATIENT DESCRIPTION: 34 F with ICH and elevated ICP.  SIGNIFICANT EVENTS / STUDIES: ICH 07/06/2013 s/p ventric ventric removed and replaced 9/8 3% saline started 9/8  LINES / TUBES: 1. PIV  CULTURES: 1. None  ANTIBIOTICS: Cefazolin 9/7 >>   HISTORY OF PRESENT ILLNESS:  Alison Ward is a 25 F with HTN who was BIBA for AMS. Patient is currently unable to provide any history; her two daughters are here with her. She complained of headaches earlier in the day today but later told her daughters that her headache had improved. She called her daughter several hours later complaining that she was lost. A nearby pedestrian called 911 and emergency services was able to locate her daughters.    SUBJECTIVE:  ventric was removed and replaced 9/8 3% saline started lethargic  PHYSICAL EXAM  VITAL SIGNS: Temp:  [97.9 F (36.6 C)-99.3 F (37.4 C)] 99.3 F (37.4 C) (09/09 0800) Pulse Rate:  [84-128] 114 (09/09 1100) Resp:  [18-28] 23 (09/09 1100) BP: (124-205)/(68-156) 163/90 mmHg (09/09 1100) SpO2:  [93 %-100 %] 98 % (09/09 1100) Weight:  [110.2 kg (242 lb 15.2 oz)] 110.2 kg (242 lb 15.2 oz) (09/09 0500)  HEMODYNAMICS:    VENTILATOR SETTINGS:    INTAKE / OUTPUT: Intake/Output     09/08 0701 - 09/09 0700 09/09 0701 - 09/10 0700   I.V. (mL/kg) 2806.3 (25.5) 407.5 (3.7)   Other 50    IV Piggyback 455    Total Intake(mL/kg) 3311.3 (30) 407.5 (3.7)   Urine (mL/kg/hr) 2965 (1.1) 730 (1.5)   Emesis/NG output     Drains 124 (0) 76 (0.2)   Total Output 3089 806   Net +222.3 -398.5          PHYSICAL EXAMINATION: General:  Obese F in NAD Neuro:  Arousable to verbal stimuli, GCS 10 HEENT:  Sclera anicteric, pupil equal and reactive bilaterally, MMM, OP  clear Neck:  Trachea supple and midline, (-) JVD, (-) LAN Cardiovascular:  RRR, NS1/S2, (-) MRG Lungs:  CTAB Abdomen:  S/NT/ND/(+)BS Musculoskeletal:  (-) C/C/E, OA toes biltaerally Skin:  Intact  LABS:  CBC Recent Labs     07/06/13  0210  07/07/13  0345  WBC  9.1  14.9*  HGB  13.1  13.3  HCT  38.8  39.4  PLT  215  217    Coag's Recent Labs     07/06/13  0210  APTT  27  INR  0.95    BMET Recent Labs     07/06/13  0211  07/06/13  1530  07/07/13  0345   07/07/13  2000  07/08/13  0248  07/08/13  0815  NA  138  137  136   < >  142  146*  149*  K  2.9*  3.4*  3.4*   --    --    --    --   CL  101  97  99   --    --    --    --   CO2  26  23  25    --    --    --    --   BUN  11  10  12    --    --    --    --  CREATININE  0.69  0.61  0.69   --    --    --    --   GLUCOSE  157*  125*  112*   --    --    --    --    < > = values in this interval not displayed.    Electrolytes Recent Labs     07/06/13  0211  07/06/13  1530  07/07/13  0345  CALCIUM  8.9  9.4  9.3    Sepsis Markers No results found for this basename: LACTICACIDVEN, PROCALCITON, O2SATVEN,  in the last 72 hours  ABG No results found for this basename: PHART, PCO2ART, PO2ART,  in the last 72 hours  Liver Enzymes No results found for this basename: AST, ALT, ALKPHOS, BILITOT, ALBUMIN,  in the last 72 hours  Cardiac Enzymes No results found for this basename: TROPONINI, PROBNP,  in the last 72 hours  Glucose Recent Labs     07/06/13  0057  GLUCAP  156*    Imaging Ct Angio Head W/cm &/or Wo Cm  07/07/2013   *RADIOLOGY REPORT*  Clinical Data:  Intracranial hemorrhage.  CT ANGIOGRAPHY HEAD  Technique:  Multidetector CT imaging of the head was performed using the standard protocol during bolus administration of intravenous contrast.  Multiplanar CT image reconstructions including MIPs were obtained to evaluate the vascular anatomy.  Contrast: 50mL OMNIPAQUE IOHEXOL 350 MG/ML SOLN  Comparison:   CT head without contrast 07/06/2013.  Findings:  A right parietal and temporal lobe hemorrhage is not significantly changed in size surrounding edema is as expected. Intraventricular hemorrhage is more pronounced than on the prior exam.  The right frontal ventriculostomy catheter is now in place. There is minimal hemorrhage along the shunt site.  Subarachnoid blood over the left parietal convexity is again noted.  There is no significant hydrocephalus.  The paranasal sinuses and mastoid air cells are clear.  The osseous skull is otherwise intact.  The postcontrast images demonstrate no pathologic enhancement.  CTA images demonstrate atherosclerotic calcifications within the cavernous carotid arteries bilaterally.  Enlarged empty sella is noted.  No significant vascular stenoses are evident.  The right A1 segment is dominant.  The anterior communicating artery is patent. The MCA bifurcations are within normal limits bilaterally.  The ACA and MCA branch vessels are normal.  No significant vascular lesions are associated with the area of hemorrhage.  The vertebral arteries are codominant.  The basilar artery is within normal limits.  Prominent posterior communicating arteries are present bilaterally.  P1 segments contribute as well, more equally on the left.  There is some irregularity of distal PCA branch vessels.  The dural sinuses are patent.   Review of the MIP images confirms the above findings.  IMPRESSION:  1.  Minimal distal small vessel disease. 2.  No significant vascular lesion associated with the area of hemorrhage. 3.  Stable right parietal parenchymal hemorrhage. 4.  Status post right to the tracheostomy placement without evidence for hydrocephalus. 5.  The extent of intraventricular hemorrhage has increased.   Original Report Authenticated By: Marin Roberts, M.D.   Dg Chest Port 1 View  07/07/2013   *RADIOLOGY REPORT*  Clinical Data: Left IJ line placement.  PORTABLE CHEST - 1 VIEW  Comparison:  07/07/2013  Findings: Left IJ line has been placed, tip overlying the level of the brachycephalic vein - SVC confluence.  No evidence for pneumothorax.  Film is made with shallow lung inflation.  Heart is enlarged.  There is mild pulmonary vascular congestion. Degenerative changes are seen in the spine.  IMPRESSION:  1.  Interval placement of left IJ line, tip overlying the level of the superior vena cava - SVC confluence. 2.  No postprocedure pneumothorax. 3.  Cardiomegaly and vascular congestion.   Original Report Authenticated By: Norva Pavlov, M.D.   Dg Chest Port 1 View  07/07/2013   *RADIOLOGY REPORT*  Clinical Data: Pneumonia  PORTABLE CHEST - 1 VIEW  Comparison: Prior chest x-ray 07/06/2013  Findings: Increased pulmonary vascular congestion now with mild interstitial edema.  Stable cardiomegaly.  No focal airspace consolidation.  No pneumothorax.  No acute osseous abnormality.  IMPRESSION:  Increased pulmonary vascular congestion now with mild pulmonary edema.  Stable cardiomegaly.   Original Report Authenticated By: Malachy Moan, M.D.    EKG: Personally reviewed, Sinus. TWI V4-V6 CXR: Personally reviewed. Rotated. Clear.   ASSESSMENT / PLAN: Principal Problem:   Intracerebral hemorrhage Active Problems:   HYPERTENSION   PULMONARY A:  1. No active issues  P:    Follow airway protection, secretion management closely, she is protecting adequately currently  CARDIOVASCULAR A:  1. HTN: Intrinsic + reactive. Goal SBP <= 140  P:   Nicardipine Drip, weaned to off 9/8  RENAL A:  1. No Acute Issues  P:    F/u BMP  GASTROINTESTINAL A:  No acute issues  HEMATOLOGIC A:   1. No Acute Issues  INFECTIOUS A: Cefazolin 9/7  ENDOCRINE A:   1. No Acute Issues  NEUROLOGIC A:  1. Intracerebral Hemorrhage 2. Intraventricular blood and enlargement s/p ventriculostomy 9/7 and replaced 9/8 3. Continued somnolence 9/8  P:    ventric replaced 9/8  3% saline  protocol.   Valproate   Serial Neuro Exams; if GCS drops will intubate for airway protection  Discussed status and plans with daughters at bedside.   TODAY'S SUMMARY:   I have personally obtained a history, examined the patient, evaluated laboratory and imaging results, formulated the assessment and plan and placed orders.     Levy Pupa, MD, PhD 07/08/2013, 11:32 AM [facility] [phone] or if no answer [phone]

## 2013-07-08 NOTE — Progress Notes (Signed)
Physical Therapy Treatment Patient Details Name: Alison Ward MRN: [id number] DOB: 1960/11/27 Today's Date: 07/08/2013 Time: 4098-1191 PT Time Calculation (min): 19 min  PT Assessment / Plan / Recommendation  History of Present Illness Alison Ward is an 52 y.o. female with a history of hypertension and poor compliance with taking medication, presenting with confusion and headache as well as lethargy. Patient was confused and called family members to get directions home. She was noted to be outside of the home but unknown resident. A pedestrian called EMS. Emergency services subsequently located family members. Blood pressure on arrival in the emergency room was 202/98. CT scan of her head showed a 4.1 x 3.1 cm parietotemporal intracerebral infarction with surrounding edema and intraventricular extension of hemorrhage. Patient had a 6 mm right to left midline shift. There is no previous history of stroke nor TIA. Patient has not been on antiplatelet therapy. NIH stroke score was 20. 59 F with ICH and elavated ICP   PT Comments   Pt very limited due to lethargy this session. RN stated pt had just finished moving around during CT scan and most likely fatigue.  Will continue to assess overall improvement and appropriate d/c plans.     Follow Up Recommendations  CIR;Supervision/Assistance - 24 hour     Equipment Recommendations  Other (comment)    Recommendations for Other Services Rehab consult  Frequency Min 3X/week   Progress towards PT Goals Progress towards PT goals: Not progressing toward goals - comment (lethargy)  Plan Current plan remains appropriate    Precautions / Restrictions Precautions Precautions: Fall   Pertinent Vitals/Pain Did not rate    Mobility  Bed Mobility Bed Mobility: Supine to Sit;Sitting - Scoot to [place] of Bed;Sit to Supine;Scooting to [facility] Supine to Sit: 1: +2 Total assist Supine to Sit: Patient Percentage: 10% Sitting - Scoot to Edge of Bed: 1: +2  Total assist Sitting - Scoot to Edge of Bed: Patient Percentage: 10% Sit to Supine: 1: +2 Total assist Sit to Supine: Patient Percentage: 0% Scooting to HOB: 1: +2 Total assist Scooting to [facility]: Patient Percentage: 0% Details for Bed Mobility Assistance: Pt very limited due to lethargy this session.   Transfers Transfers: Not assessed Ambulation/Gait Ambulation/Gait Assistance: Not tested (comment)    Exercises     PT Diagnosis:    PT Problem List:   PT Treatment Interventions:     PT Goals (current goals can now be found in the care plan section) Acute Rehab PT Goals Patient Stated Goal: did not set PT Goal Formulation: Patient unable to participate in goal setting Time For Goal Achievement: 07/21/13 Potential to Achieve Goals: Good  Visit Information  Last PT Received On: 07/08/13 Assistance Needed: +2 History of Present Illness: Alison Ward is an 52 y.o. female with a history of hypertension and poor compliance with taking medication, presenting with confusion and headache as well as lethargy. Patient was confused and called family members to get directions home. She was noted to be outside of the home but unknown resident. A pedestrian called EMS. Emergency services subsequently located family members. Blood pressure on arrival in the emergency room was 202/98. CT scan of her head showed a 4.1 x 3.1 cm parietotemporal intracerebral infarction with surrounding edema and intraventricular extension of hemorrhage. Patient had a 6 mm right to left midline shift. There is no previous history of stroke nor TIA. Patient has not been on antiplatelet therapy. NIH stroke score was 20. 80 F with ICH  and elavated ICP    Subjective Data  Subjective: Did not speak due to lethargy Patient Stated Goal: did not set   Cognition  Cognition Arousal/Alertness: Lethargic Behavior During Therapy: Flat affect Overall Cognitive Status: Difficult to assess Current Attention Level:  Focused General Comments: Pt very lethargic this session and unable to open eyes to participate with PT.  Difficult to assess cognition Difficult to assess due to: Level of arousal    Balance  Static Sitting Balance Static Sitting - Balance Support: Bilateral upper extremity supported;Feet supported Static Sitting - Level of Assistance: 1: +1 Total assist Static Sitting - Comment/# of Minutes: (A) to maintain overall balance.  Pt did not open eyes this session and continued to lay back down.   End of Session PT - End of Session Activity Tolerance: Patient limited by lethargy Patient left: in bed;with call bell/phone within reach;with nursing/sitter in room Nurse Communication: Mobility status   GP     Rolando Whitby 07/08/2013, 4:39 PM   Jake Shark, PT DPT [phone]

## 2013-07-08 NOTE — Progress Notes (Signed)
Current NA 146. Dr. Roseanne Reno called and told to continue the rate of 3% saline at 75 ml/hr. Also made aware of increased drowsiness.  Patient not following commands but still purposeful in all four extremities and responds to daughter when name is called. Pupils remain 4 mm equal and brisk. Patient has received a total of 3 mg of Ativan since 2200 (9/8) due to restlessness and agitation.  No new orders at this time. Will continue to monitor. Jacqulynn Cadet

## 2013-07-08 NOTE — Progress Notes (Signed)
Stroke Team Progress Note  HISTORY Alison Ward is an 52 y.o. female with a history of hypertension and poor compliance with taking medication, presenting with confusion and headache as well as lethargy. Patient was confused and called family members to get directions home. She was noted to be outside of the home but unknown resident. A pedestrian called EMS. Emergency services subsequently located family members. Blood pressure on arrival in the emergency room was 202/98. CT scan of her head showed a 4.1 x 3.1 cm parietotemporal intracerebral infarction with surrounding edema and intraventricular extension of hemorrhage. Patient had a 6 mm right to left midline shift. There is no previous history of stroke nor TIA. Patient has not been on antiplatelet therapy. NIH stroke score was 20.  LSN: Unclear  tPA Given: No: ICH  MRankin: 4  Patient was not a TPA candidate secondary to ICH  She was admitted to the neuro ICU for further evaluation and treatment.  SUBJECTIVE Patient initially had to undergo placement of right frontal ventriculostomy via a burr hole but continued to have decreased mental status and on 07/07/2013 had to undergo placement of left frontal ventriculostomy via a burr hole and removal of right ventriculostomy. She is on 3% saline.   Remains lethargic state. Will barely arouse to stimuli, but would not at all yesterday.  OBJECTIVE Most recent Vital Signs: Filed Vitals:   07/08/13 0400 07/08/13 0500 07/08/13 0600 07/08/13 0700  BP: 135/78 154/86 134/78 146/76  Pulse: 108 115 105 113  Temp: 97.9 F (36.6 C)     TempSrc: Core (Comment)     Resp: 25 25 25 22   Height:      Weight:  110.2 kg (242 lb 15.2 oz)    SpO2: 97% 97% 99% 97%   CBG (last 3)   Recent Labs  07/06/13 0057  GLUCAP 156*    IV Fluid Intake:   . niCARDipine 12 mg/hr (07/08/13 0700)  . sodium chloride (hypertonic) 75 mL/hr at 07/08/13 0700    MEDICATIONS  . antiseptic oral rinse  15 mL Mouth Rinse  [id number]  .  ceFAZolin (ANCEF) IV  2 g Intravenous Q8H  . chlorhexidine  15 mL Mouth Rinse BID  . hydrochlorothiazide  25 mg Oral Daily  . lisinopril  5 mg Oral Daily  . pantoprazole (PROTONIX) IV  40 mg Intravenous QHS  . senna-docusate  1 tablet Oral BID  . valproate sodium  500 mg Intravenous Q8H   PRN:  acetaminophen, acetaminophen, LORazepam, ondansetron  Diet:  NPO Activity:  Bedrest DVT Prophylaxis:  SCDs CLINICALLY SIGNIFICANT STUDIES Basic Metabolic Panel:   Recent Labs Lab 07/06/13 1530 07/07/13 0345  07/07/13 2000 07/08/13 0248  NA 137 136  < > 142 146*  K 3.4* 3.4*  --   --   --   CL 97 99  --   --   --   CO2 23 25  --   --   --   GLUCOSE 125* 112*  --   --   --   BUN 10 12  --   --   --   CREATININE 0.61 0.69  --   --   --   CALCIUM 9.4 9.3  --   --   --   < > = values in this interval not displayed. Liver Function Tests: No results found for this basename: AST, ALT, ALKPHOS, BILITOT, PROT, ALBUMIN,  in the last 168 hours CBC:   Recent Labs Lab 07/06/13 0210 07/07/13  0345  WBC 9.1 14.9*  HGB 13.1 13.3  HCT 38.8 39.4  MCV 83.8 82.9  PLT 215 217   Coagulation:   Recent Labs Lab 07/06/13 0210  LABPROT 12.5  INR 0.95   Cardiac Enzymes: No results found for this basename: CKTOTAL, CKMB, CKMBINDEX, TROPONINI,  in the last 168 hours Urinalysis:   Recent Labs Lab 07/06/13 0359  COLORURINE YELLOW  LABSPEC 1.009  PHURINE 8.0  GLUCOSEU 100*  HGBUR NEGATIVE  BILIRUBINUR NEGATIVE  KETONESUR NEGATIVE  PROTEINUR NEGATIVE  UROBILINOGEN 0.2  NITRITE NEGATIVE  LEUKOCYTESUR NEGATIVE   Lipid Panel    Component Value Date/Time   CHOL 244* 07/06/2013 0420   TRIG 153* 07/06/2013 0420   HDL 45 07/06/2013 0420   CHOLHDL 5.4 07/06/2013 0420   VLDL 31 07/06/2013 0420   LDLCALC 168* 07/06/2013 0420   HgbA1C  Lab Results  Component Value Date   HGBA1C 6.0* 07/06/2013    Urine Drug Screen:   No results found for this basename: labopia,  cocainscrnur,  labbenz,   amphetmu,  thcu,  labbarb    Alcohol Level: No results found for this basename: ETH,  in the last 168 hours  Ct Angio Head W/cm &/or Wo Cm 07/07/2013   A right parietal and temporal lobe hemorrhage is not significantly changed in size surrounding edema is as expected. Intraventricular hemorrhage is more pronounced than on the prior exam.  The right frontal ventriculostomy catheter is now in place. There is minimal hemorrhage along the shunt site.  Subarachnoid blood over the left parietal convexity is again noted.  There is no significant hydrocephalus.  The paranasal sinuses and mastoid air cells are clear.  The osseous skull is otherwise intact.  The postcontrast images demonstrate no pathologic enhancement.  CTA images demonstrate atherosclerotic calcifications within the cavernous carotid arteries bilaterally.  Enlarged empty sella is noted.  No significant vascular stenoses are evident.  The right A1 segment is dominant.  The anterior communicating artery is patent. The MCA bifurcations are within normal limits bilaterally.  The ACA and MCA branch vessels are normal.  No significant vascular lesions are associated with the area of hemorrhage.  The vertebral arteries are codominant.  The basilar artery is within normal limits.  Prominent posterior communicating arteries are present bilaterally.  P1 segments contribute as well, more equally on the left.  There is some irregularity of distal PCA branch vessels.  The dural sinuses are patent.   Review of the MIP images confirms the above findings.  IMPRESSION:  1.  Minimal distal small vessel disease. 2.  No significant vascular lesion associated with the area of hemorrhage. 3.  Stable right parietal parenchymal hemorrhage. 4.  Status post right to the tracheostomy placement without evidence for hydrocephalus. 5.  The extent of intraventricular hemorrhage has increased.     Dg Chest Port 1 View 07/07/2013   1.  Interval placement of left IJ line, tip overlying  the level of the superior vena cava - SVC confluence. 2.  No postprocedure pneumothorax. 3.  Cardiomegaly and vascular congestion.  07/07/2013  Increased pulmonary vascular congestion now with mild pulmonary edema.  Stable cardiomegaly.    MRI of the brain   MRA of the brain    2D Echocardiogram  EF 65%, no regional wall motion abnormality identified, no cardiac source of emboli identified.  Carotid Doppler  Findings suggest 1-39% stenosis bilaterally. Vertebral arteries are patent with antegrade flow.  CXR 07/06/13 Shallow inspiration. Mild cardiac enlargement. No evidence of  active pulmonary disease.   EKG Sinus rhythm Probable LVH with secondary repol abnrm   Therapy Recommendations pending Physical Exam   Obese middle aged african american lady not in distress.Awake alert. Afebrile. Head is nontraumatic. Neck is supple without bruit. Hearing is normal. Cardiac exam no murmur or gallop. Lungs are clear to auscultation. Distal pulses are well felt. She has a right frontal ventriculostomy catheter Neurological Exam :  Drowsy but can be aroused  . No gaze deviation. Pupils equal reactive. Decrease blink to threat on the left. Disoriented to time and place. Diminished attention, registration and recall. Speech is  dysarthric. Follows commands well. Mild left lower facial weakness. Motor system exam reveals no upper or lower extremity drift but patient's cooperation is limited. I suspect mild left grip and hip flexor and ankle dorsiflexor weakness. Reflexes are symmetric. Left plantar is equivocal and right is downgoing. Gait was not tested.   ASSESSMENT Alison Ward is a 52 y.o. female presenting with  Headache, confusion and vision changes secondary to large right pareital parenchymal hemorrhage with intraventricular extension mild cytotoxic edema and hydrocephalous-etiology likely hypertensive with accelerated hypertension. Patient initially had to undergo placement of right frontal  ventriculostomy via a burr hole but continued to have decreased mental status and on 07/07/2013 had to undergo placement of left frontal ventriculostomy via a burr hole and removal of right ventriculostomy. She does respond minimally with head turn to family voice, but this is minimal.   Hyperlipidemia, add statin when able to take po/per tube, LDL 168 goal < 100 for non diabetics.  Cytotoxic edema, 3% saline  Hypertension   Hospital day # 2  TREATMENT/PLAN  Continue strict control of hypertension with blood pressure goal below 160. Continue Cardene drip for now.  IV [place] for headache.  Hypertonic saline protocol, Na+ 146  Dr. Pearlean Brownie discussed the patients plan of care with family, CCM.  Chest portable head CT to look for change in hemorrhage size  This patient is critically ill and at significant risk of neurological worsening, death and care requires constant monitoring of vital signs, hemodynamics,respiratory and cardiac monitoring,review of multiple databases, neurological assessment, discussion with family, other specialists and medical decision making of high complexity. I spent 35 minutes of neurocritical care time  in the care of  this patient.  Gwendolyn Lima. Manson Passey, [facility], MBA, MHA [facility] Pager: [phone] 07/08/2013 8:07 AM  I have personally obtained a history, examined the patient, evaluated imaging results, and formulated the assessment and plan of care. I agree with the above.  Delia Heady, MD

## 2013-07-08 NOTE — Progress Notes (Signed)
SLP Cancellation Note  Patient Details Name: Alison Ward MRN: [id number] DOB: [birth year]   Cancelled treatment:       Reason Eval/Treat Not Completed: Fatigue/lethargy limiting ability to participate. Pt still very lethargic, given ativan for CT. Though pt demonstrates good swallow function when awake, concerned that it may be awhile before she is able to consume a diet. Will continue to follow for readiness for SLP interventions.   [name], [place] CCC-SLP [phone]  [name] 07/08/2013, 10:55 AM

## 2013-07-08 NOTE — Progress Notes (Signed)
Patient ID: Alison Ward, female   DOB: 1961-07-10, 52 y.o.   MRN: [id number] Subjective:  The patient is somnolent. She was given sedation secondary to agitation. I spoke with her daughter and sister.  Objective: Vital signs in last 24 hours: Temp:  [97.9 F (36.6 C)-98.8 F (37.1 C)] 97.9 F (36.6 C) (09/09 0400) Pulse Rate:  [84-128] 113 (09/09 0700) Resp:  [16-32] 22 (09/09 0700) BP: (124-205)/(70-156) 146/76 mmHg (09/09 0700) SpO2:  [93 %-99 %] 97 % (09/09 0700) Weight:  [110.2 kg (242 lb 15.2 oz)] 110.2 kg (242 lb 15.2 oz) (09/09 0500)  Intake/Output from previous day: 09/08 0701 - 09/09 0700 In: 3311.3 [I.V.:2806.3; IV Piggyback:455] Out: 3089 [Urine:2965; Drains:124] Intake/Output this shift:    Physical exam Glasgow Coma Scale 12. The patient is somnolent but arousable. She moves all 4 extremities. Her pupils are equal.  The patient's ventriculostomy is patent.  Lab Results:  Recent Labs  07/06/13 0210 07/07/13 0345  WBC 9.1 14.9*  HGB 13.1 13.3  HCT 38.8 39.4  PLT 215 217   BMET  Recent Labs  07/06/13 1530 07/07/13 0345  07/07/13 2000 07/08/13 0248  NA 137 136  < > 142 146*  K 3.4* 3.4*  --   --   --   CL 97 99  --   --   --   CO2 23 25  --   --   --   GLUCOSE 125* 112*  --   --   --   BUN 10 12  --   --   --   CREATININE 0.61 0.69  --   --   --   CALCIUM 9.4 9.3  --   --   --   < > = values in this interval not displayed.  Studies/Results: Ct Angio Head W/cm &/or Wo Cm  07/07/2013   *RADIOLOGY REPORT*  Clinical Data:  Intracranial hemorrhage.  CT ANGIOGRAPHY HEAD  Technique:  Multidetector CT imaging of the head was performed using the standard protocol during bolus administration of intravenous contrast.  Multiplanar CT image reconstructions including MIPs were obtained to evaluate the vascular anatomy.  Contrast: 50mL OMNIPAQUE IOHEXOL 350 MG/ML SOLN  Comparison:  CT head without contrast 07/06/2013.  Findings:  A right parietal and temporal  lobe hemorrhage is not significantly changed in size surrounding edema is as expected. Intraventricular hemorrhage is more pronounced than on the prior exam.  The right frontal ventriculostomy catheter is now in place. There is minimal hemorrhage along the shunt site.  Subarachnoid blood over the left parietal convexity is again noted.  There is no significant hydrocephalus.  The paranasal sinuses and mastoid air cells are clear.  The osseous skull is otherwise intact.  The postcontrast images demonstrate no pathologic enhancement.  CTA images demonstrate atherosclerotic calcifications within the cavernous carotid arteries bilaterally.  Enlarged empty sella is noted.  No significant vascular stenoses are evident.  The right A1 segment is dominant.  The anterior communicating artery is patent. The MCA bifurcations are within normal limits bilaterally.  The ACA and MCA branch vessels are normal.  No significant vascular lesions are associated with the area of hemorrhage.  The vertebral arteries are codominant.  The basilar artery is within normal limits.  Prominent posterior communicating arteries are present bilaterally.  P1 segments contribute as well, more equally on the left.  There is some irregularity of distal PCA branch vessels.  The dural sinuses are patent.   Review of the MIP images  confirms the above findings.  IMPRESSION:  1.  Minimal distal small vessel disease. 2.  No significant vascular lesion associated with the area of hemorrhage. 3.  Stable right parietal parenchymal hemorrhage. 4.  Status post right to the tracheostomy placement without evidence for hydrocephalus. 5.  The extent of intraventricular hemorrhage has increased.   Original Report Authenticated By: Marin Roberts, M.D.   Dg Chest Port 1 View  07/07/2013   *RADIOLOGY REPORT*  Clinical Data: Left IJ line placement.  PORTABLE CHEST - 1 VIEW  Comparison: 07/07/2013  Findings: Left IJ line has been placed, tip overlying the level of  the brachycephalic vein - SVC confluence.  No evidence for pneumothorax.  Film is made with shallow lung inflation.  Heart is enlarged.  There is mild pulmonary vascular congestion. Degenerative changes are seen in the spine.  IMPRESSION:  1.  Interval placement of left IJ line, tip overlying the level of the superior vena cava - SVC confluence. 2.  No postprocedure pneumothorax. 3.  Cardiomegaly and vascular congestion.   Original Report Authenticated By: Norva Pavlov, M.D.   Dg Chest Port 1 View  07/07/2013   *RADIOLOGY REPORT*  Clinical Data: Pneumonia  PORTABLE CHEST - 1 VIEW  Comparison: Prior chest x-ray 07/06/2013  Findings: Increased pulmonary vascular congestion now with mild interstitial edema.  Stable cardiomegaly.  No focal airspace consolidation.  No pneumothorax.  No acute osseous abnormality.  IMPRESSION:  Increased pulmonary vascular congestion now with mild pulmonary edema.  Stable cardiomegaly.   Original Report Authenticated By: Malachy Moan, M.D.    Assessment/Plan: Right intracerebral hemorrhage, intraventricular hemorrhage, hydrocephalus: The patient has not changed neurologically since placement of her most recent ventriculostomy, however she has been recently sedated. I'll plan to continue the ventriculostomy for a few days and repeat her scan towards the end of the week.  LOS: 2 days     Aubriauna Riner D 07/08/2013, 8:01 AM

## 2013-07-09 ENCOUNTER — Inpatient Hospital Stay (HOSPITAL_COMMUNITY): Payer: Self-pay

## 2013-07-09 LAB — CBC
MCHC: 32.8 g/dL (ref 30.0–36.0)
Platelets: 235 10*3/uL (ref 150–400)
RDW: 16.6 % — ABNORMAL HIGH (ref 11.5–15.5)
WBC: 9.5 10*3/uL (ref 4.0–10.5)

## 2013-07-09 LAB — BASIC METABOLIC PANEL
CO2: 33 mEq/L — ABNORMAL HIGH (ref 19–32)
Chloride: 110 mEq/L (ref 96–112)
Glucose, Bld: 83 mg/dL (ref 70–99)
Potassium: 3.1 mEq/L — ABNORMAL LOW (ref 3.5–5.1)
Sodium: 152 mEq/L — ABNORMAL HIGH (ref 135–145)

## 2013-07-09 LAB — SODIUM: Sodium: 149 mEq/L — ABNORMAL HIGH (ref 135–145)

## 2013-07-09 LAB — MAGNESIUM: Magnesium: 2.3 mg/dL (ref 1.5–2.5)

## 2013-07-09 LAB — PHOSPHORUS: Phosphorus: 3.1 mg/dL (ref 2.3–4.6)

## 2013-07-09 MED ORDER — LABETALOL HCL 5 MG/ML IV SOLN
10.0000 mg | INTRAVENOUS | Status: DC | PRN
  Administered 2013-07-11: 10 mg via INTRAVENOUS
  Filled 2013-07-09: qty 4

## 2013-07-09 MED ORDER — POTASSIUM CHLORIDE CRYS ER 20 MEQ PO TBCR
30.0000 meq | EXTENDED_RELEASE_TABLET | ORAL | Status: AC
  Administered 2013-07-09 (×2): 30 meq via ORAL
  Filled 2013-07-09 (×2): qty 1

## 2013-07-09 MED ORDER — OXYCODONE HCL 5 MG PO TABS
5.0000 mg | ORAL_TABLET | Freq: Four times a day (QID) | ORAL | Status: DC | PRN
  Administered 2013-07-09 – 2013-07-12 (×5): 5 mg via ORAL
  Filled 2013-07-09 (×6): qty 1

## 2013-07-09 NOTE — Progress Notes (Signed)
Dr. Roseanne Reno called regarding Portable head CT order. Per CT protocol they are only supposed to do portable head CT if patient is on the vent and has an IVC. Worried about patient becoming too restless on the CT table and risk of fall.  Ordered to wait and see what Dr. Pearlean Brownie and stroke team would like to do in the morning to keep the patient the safest.  Will continue to monitor. Jacqulynn Cadet

## 2013-07-09 NOTE — Progress Notes (Signed)
PULMONARY  / CRITICAL CARE MEDICINE  Name: Alison Ward MRN: [id number] DOB: 24-Jul-1961    ADMISSION DATE:  07/06/2013 CONSULTATION DATE:  07/06/2013  REFERRING MD :  Dr. Lavella Lemons PRIMARY SERVICE: CCM  CHIEF COMPLAINT:  AMS  BRIEF PATIENT DESCRIPTION: 40 F with ICH and elevated ICP.  SIGNIFICANT EVENTS / STUDIES: ICH 07/06/2013 s/p ventric ventric removed and replaced 9/8 3% saline started 9/8  CULTURES: 1. None  ANTIBIOTICS: Cefazolin 9/7 >>   HISTORY OF PRESENT ILLNESS:  Alison Ward is a 5 F with HTN who was BIBA for AMS. Patient is currently unable to provide any history; her two daughters are here with her. She complained of headaches earlier in the day today but later told her daughters that her headache had improved. She called her daughter several hours later complaining that she was lost. A nearby pedestrian called 911 and emergency services was able to locate her daughters.    SUBJECTIVE:  ventric was removed and replaced 9/8 3% saline stopped 9/10 More awake  PHYSICAL EXAM  VITAL SIGNS: Temp:  [97.9 F (36.6 C)-98.6 F (37 C)] 97.9 F (36.6 C) (09/10 0359) Pulse Rate:  [88-114] 89 (09/10 1100) Resp:  [15-27] 19 (09/10 1100) BP: (123-185)/(81-102) 160/97 mmHg (09/10 1100) SpO2:  [92 %-100 %] 95 % (09/10 1100) Weight:  [109.1 kg (240 lb 8.4 oz)] 109.1 kg (240 lb 8.4 oz) (09/10 0500)  HEMODYNAMICS:    VENTILATOR SETTINGS:    INTAKE / OUTPUT: Intake/Output     09/09 0701 - 09/10 0700 09/10 0701 - 09/11 0700   P.O. 180    I.V. (mL/kg) 632.5 (5.8)    Other     IV Piggyback 150    Total Intake(mL/kg) 962.5 (8.8)    Urine (mL/kg/hr) 3725 (1.4) 150 (0.3)   Drains 393 (0.2) 43 (0.1)   Total Output 4118 193   Net -3155.5 -193        Urine Occurrence 1 x 2 x     PHYSICAL EXAMINATION: General:  Obese F in NAD Neuro:  Awake and interacting, a little slow to respond but appropriate HEENT:  Sclera anicteric, pupil equal and reactive bilaterally, MMM, OP  clear Neck:  Trachea supple and midline, (-) JVD, (-) LAN Cardiovascular:  RRR, NS1/S2, (-) MRG Lungs:  CTAB Abdomen:  S/NT/ND/(+)BS Musculoskeletal:  (-) C/C/E, OA toes biltaerally Skin:  Intact  LABS:  CBC Recent Labs     07/07/13  0345  WBC  14.9*  HGB  13.3  HCT  39.4  PLT  217    Coag's No results found for this basename: APTT, INR,  in the last 72 hours  BMET Recent Labs     07/06/13  1530  07/07/13  0345   07/08/13  2058  07/09/13  0258  07/09/13  1000  NA  137  136   < >  153*  152*  149*  K  3.4*  3.4*   --    --   3.1*   --   CL  97  99   --    --   110   --   CO2  23  25   --    --   33*   --   BUN  10  12   --    --   18   --   CREATININE  0.61  0.69   --    --   0.83   --   GLUCOSE  125*  112*   --    --   83   --    < > = values in this interval not displayed.    Electrolytes Recent Labs     07/06/13  1530  07/07/13  0345  07/09/13  0258  CALCIUM  9.4  9.3  9.5  MG   --    --   2.3  PHOS   --    --   3.1    Sepsis Markers No results found for this basename: LACTICACIDVEN, PROCALCITON, O2SATVEN,  in the last 72 hours  ABG No results found for this basename: PHART, PCO2ART, PO2ART,  in the last 72 hours  Liver Enzymes No results found for this basename: AST, ALT, ALKPHOS, BILITOT, ALBUMIN,  in the last 72 hours  Cardiac Enzymes No results found for this basename: TROPONINI, PROBNP,  in the last 72 hours  Glucose No results found for this basename: GLUCAP,  in the last 72 hours  Imaging Dg Chest Port 1 View  07/07/2013   *RADIOLOGY REPORT*  Clinical Data: Left IJ line placement.  PORTABLE CHEST - 1 VIEW  Comparison: 07/07/2013  Findings: Left IJ line has been placed, tip overlying the level of the brachycephalic vein - SVC confluence.  No evidence for pneumothorax.  Film is made with shallow lung inflation.  Heart is enlarged.  There is mild pulmonary vascular congestion. Degenerative changes are seen in the spine.  IMPRESSION:  1.   Interval placement of left IJ line, tip overlying the level of the superior vena cava - SVC confluence. 2.  No postprocedure pneumothorax. 3.  Cardiomegaly and vascular congestion.   Original Report Authenticated By: Norva Pavlov, M.D.   Ct Portable Head W/o Cm  07/09/2013   *RADIOLOGY REPORT*  Clinical Data: Followup intracranial hemorrhage.  CT HEAD WITHOUT CONTRAST  Technique:  Contiguous axial images were obtained from the base of the skull through the vertex without contrast.  Comparison: 07/07/2013.  Findings: Exam was performed portably.  The patient sat up during examination and there is significant motion degradation.  Further imaging was cancelled by Dr. Pearlean Brownie.  Exam is limited with respect to evaluating for any significant change given the degree of motion.  Right parietal - posterior temporal lobe hemorrhage is noted. Blood is seen within the ventricles with shunt catheter in place. When compared to the prior examination, now seen is blood within the left lateral ventricle and not noted on the prior exam.  Limited for evaluating for progressive parenchymal hemorrhage, infarct, evaluating for progressive hydrocephalus or detecting underlying mass.  IMPRESSION: Markedly limited exam secondary to motion.  There appears to be new dependent left ventricular hemorrhage.  Right ventricle hemorrhage and right parietal - posterior temporal hematoma once again noted.   Original Report Authenticated By: Lacy Duverney, M.D.    EKG: Personally reviewed, Sinus. TWI V4-V6 CXR: Personally reviewed. Rotated. Clear.   ASSESSMENT / PLAN: Principal Problem:   Intracerebral hemorrhage Active Problems:   HYPERTENSION   PULMONARY A:  1. No active issues P:    Follow airway protection, secretion management closely, she is protecting adequately currently  CARDIOVASCULAR A:  1. HTN: Intrinsic + reactive. Goal SBP <= 140 P:   Nicardipine Drip, weaned to off 9/8  RENAL A:  1. No Acute Issues  P:     F/u BMP  GASTROINTESTINAL A:  Start PO's on 9/10  HEMATOLOGIC A:   1. No Acute Issues  INFECTIOUS A: Cefazolin 9/7 while ventric  drain is in  ENDOCRINE A:   1. No Acute Issues  NEUROLOGIC A:  1. Intracerebral Hemorrhage 2. Intraventricular blood and enlargement s/p ventriculostomy 9/7 and replaced 9/8 3. Continued somnolence 9/8  P:    ventric replaced 9/8  3% saline protocol stopped 9/10  Valproate   Serial Neuro Exams improving  Out of ICU when ventric able to be removed  F/u imaging per neurology's plans  Discussed status and plans with pt and daughters at bedside.   TODAY'S SUMMARY:   I have personally obtained a history, examined the patient, evaluated laboratory and imaging results, formulated the assessment and plan and placed orders.     Levy Pupa, MD, PhD 07/09/2013, 12:15 PM [facility] [phone] or if no answer [phone]

## 2013-07-09 NOTE — Progress Notes (Signed)
Patient ID: Alison Ward, female   DOB: Mar 04, 1961, 52 y.o.   MRN: [id number] Subjective:  The patient is much more alert today. She is pleasant and conversant. I spoke with the patient's sister.  Objective: Vital signs in last 24 hours: Temp:  [97.6 F (36.4 C)-98.6 F (37 C)] 97.9 F (36.6 C) (09/10 0359) Pulse Rate:  [88-114] 89 (09/10 1100) Resp:  [15-27] 19 (09/10 1100) BP: (123-185)/(81-102) 160/97 mmHg (09/10 1100) SpO2:  [92 %-100 %] 95 % (09/10 1100) Weight:  [109.1 kg (240 lb 8.4 oz)] 109.1 kg (240 lb 8.4 oz) (09/10 0500)  Intake/Output from previous day: 09/09 0701 - 09/10 0700 In: 962.5 [P.O.:180; I.V.:632.5; IV Piggyback:150] Out: 4118 [Urine:3725; Drains:393] Intake/Output this shift: Total I/O In: -  Out: 36 [Drains:36]  Physical exam the patient is alert and oriented x1. Glasgow Coma Scale 13. She is moving all 4 extremities. Her ventriculostomy is patent and draining bloody CSF.  Lab Results:  Recent Labs  07/07/13 0345  WBC 14.9*  HGB 13.3  HCT 39.4  PLT 217   BMET  Recent Labs  07/07/13 0345  07/09/13 0258 07/09/13 1000  NA 136  < > 152* 149*  K 3.4*  --  3.1*  --   CL 99  --  110  --   CO2 25  --  33*  --   GLUCOSE 112*  --  83  --   BUN 12  --  18  --   CREATININE 0.69  --  0.83  --   CALCIUM 9.3  --  9.5  --   < > = values in this interval not displayed.  Studies/Results: Dg Chest Port 1 View  07/07/2013   *RADIOLOGY REPORT*  Clinical Data: Left IJ line placement.  PORTABLE CHEST - 1 VIEW  Comparison: 07/07/2013  Findings: Left IJ line has been placed, tip overlying the level of the brachycephalic vein - SVC confluence.  No evidence for pneumothorax.  Film is made with shallow lung inflation.  Heart is enlarged.  There is mild pulmonary vascular congestion. Degenerative changes are seen in the spine.  IMPRESSION:  1.  Interval placement of left IJ line, tip overlying the level of the superior vena cava - SVC confluence. 2.  No  postprocedure pneumothorax. 3.  Cardiomegaly and vascular congestion.   Original Report Authenticated By: Norva Pavlov, M.D.    Assessment/Plan: Right intracerebral hemorrhage, intraventricular hemorrhage, hydrocephalus: The patient is doing much better today status post placement of ventriculostomy. We will plan to continue to ventriculostomy for now and try to remove it once a CSF clears up some.  LOS: 3 days     Denette Hass D 07/09/2013, 11:12 AM

## 2013-07-09 NOTE — Evaluation (Signed)
Speech Language Pathology Evaluation Patient Details Name: Alison Ward MRN: [id number] DOB: Mar 26, 1961 Today's Date: 07/09/2013 Time: 1400-1420 SLP Time Calculation (min): 20 min  Problem List:  Patient Active Problem List   Diagnosis Date Noted  . Intracerebral hemorrhage 07/06/2013  . HYPERTENSION 02/24/2009   Past Medical History:  Past Medical History  Diagnosis Date  . Hypertension    Past Surgical History:  Past Surgical History  Procedure Laterality Date  . Ventriculostomy Left 07/07/2013    Procedure: VENTRICULOSTOMY;  Surgeon: Cristi Loron, MD;  Location: [facility];  Service: Neurosurgery;  Laterality: Left;  Left Ventriculostomy and Removal of Right Vetriculostomy   HPI:  Alison Ward is an 52 y.o. female with a history of hypertension and poor compliance with taking medication, presenting with confusion and headache as well as lethargy. Patient was confused and called family members to get directions home. She was noted to be outside of the home but unknown resident. A pedestrian called EMS. Emergency services subsequently located family members. Blood pressure on arrival in the emergency room was 202/98. CT scan of her head showed a 4.1 x 3.1 cm parietotemporal intracerebral infarction with surrounding edema and intraventricular extension of hemorrhage. Patient had a 6 mm right to left midline shift. There is no previous history of stroke nor TIA. Patient has not been on antiplatelet therapy. NIH stroke score was 20.   Assessment / Plan / Recommendation Clinical Impression  Pt presents with cognitive-linguistic impairments in the areas of orientation, sustained attention, intellectual awareness, and simple auditory comprehension, all of which are impacted by her decreased level of alertness as she fatigues quickly. Pt will benefit from skilled SLP services to address the areas above and to continue to differentially diagnose cognitive-linguistic function as LOA  improves.    SLP Assessment  Patient needs continued Speech Lanaguage Pathology Services    Follow Up Recommendations  Inpatient Rehab    Frequency and Duration min 2x/week  2 weeks   Pertinent Vitals/Pain N/A   SLP Goals  SLP Goals Potential to Achieve Goals: Good Potential Considerations: Ability to learn/carryover information Progress/Goals/Alternative treatment plan discussed with pt/caregiver and they: Patient unable to parrticipate in goal setting SLP Goal #1: Pt will sustain attention for 60 seconds during familiar functional task. SLP Goal #2: Pt will utilize external aids to answer orientation questions with Max cues. SLP Goal #3: Pt will demonstrate intellectual awareness of 1 physical and 1 cognitive deficit with Max cues. SLP Goal #4: Pt will follow two-step commands with Max cues from SLP.  SLP Evaluation Prior Functioning  Cognitive/Linguistic Baseline: Information not available   Cognition  Overall Cognitive Status: Impaired/Different from baseline Arousal/Alertness: Awake/alert Orientation Level: Oriented to person;Disoriented to place;Disoriented to situation Attention: Sustained Sustained Attention: Impaired Sustained Attention Impairment: Verbal basic;Functional basic Awareness: Impaired Awareness Impairment: Intellectual impairment Problem Solving: Impaired Problem Solving Impairment: Verbal basic;Functional basic    Comprehension  Auditory Comprehension Overall Auditory Comprehension: Impaired Yes/No Questions: Impaired Basic Biographical Questions: 76-100% accurate Basic Immediate Environment Questions: 75-100% accurate Complex Questions: 50-74% accurate Commands: Impaired One Step Basic Commands: 75-100% accurate Two Step Basic Commands: 0-24% accurate Conversation: Simple Interfering Components: Attention;Other (comment) (fatigue) Reading Comprehension Reading Status: Not tested    Expression Expression Primary Mode of Expression:  Verbal Verbal Expression Overall Verbal Expression: Impaired Initiation: No impairment Automatic Speech: Name;Social Response Level of Generative/Spontaneous Verbalization: Phrase Naming: Impairment Confrontation: Impaired Convergent: 75-100% accurate Pragmatics: Impairment Impairments: Abnormal affect;Eye contact Interfering Components: Attention;Other (comment) (fatigue) Non-Verbal Means  of Communication: Not applicable Written Expression Written Expression: Not tested   Oral / Motor Oral Motor/Sensory Function Overall Oral Motor/Sensory Function: Appears within functional limits for tasks assessed Motor Speech Overall Motor Speech: Appears within functional limits for tasks assessed   GO     Maxcine Ham 07/09/2013, 2:37 PM  Maxcine Ham, M.A. CCC-SLP [phone]

## 2013-07-09 NOTE — Progress Notes (Signed)
Speech Language Pathology Dysphagia Treatment Patient Details Name: Alison Ward MRN: [id number] DOB: 08-Apr-1961 Today's Date: 07/09/2013 Time: 1345-1400 SLP Time Calculation (min): 15 min  Assessment / Plan / Recommendation Clinical Impression  Upon SLP arrival, pt is alert and consuming POs administered by sitter. Per chart, diet order has been advanced to regular textures and thin liquids. Pt required Mod verbal/tactile cues for oral acceptance, consuming 8 oz of coke via straw with more limited trials of regular textures. No overt s/s of aspiration observed. Recommend to continue current diet with full supervision. Given that pt still fatigues quickly, recommend to only feed patient when fully awake/alert, which may require smaller, more frequent meals.    Diet Recommendation  Continue with Current Diet: Regular;Thin liquid    SLP Plan Continue with current plan of care   Pertinent Vitals/Pain N/A   Swallowing Goals  SLP Swallowing Goals Patient will consume recommended diet without observed clinical signs of aspiration with: Moderate assistance Patient will utilize recommended strategies during swallow to increase swallowing safety with: Moderate assistance Goal #3: Maintain LOA and sustained attention to consume diagnostic PO trials of puree and regular solids to determine PO readiness Swallow Study Goal #3 - Progress: Met  General Temperature Spikes Noted: No Respiratory Status: Room air Behavior/Cognition: Cooperative;Confused;Lethargic;Requires cueing Oral Cavity - Dentition: Adequate natural dentition Patient Positioning: Upright in bed  Oral Cavity - Oral Hygiene Does patient have any of the following "at risk" factors?: Nutritional status - inadequate Brush patient's teeth BID with toothbrush (using toothpaste with fluoride): Yes Patient is AT RISK - Oral Care Protocol followed (see row info): Yes   Dysphagia Treatment Treatment focused on: Skilled observation of  diet tolerance Treatment Methods/Modalities: Skilled observation Patient observed directly with PO's: Yes Type of PO's observed: Regular;Thin liquids Feeding: Needs assist Liquids provided via: Straw Type of cueing: Verbal;Tactile Amount of cueing: Moderate   GO     Maxcine Ham 07/09/2013, 2:25 PM   Maxcine Ham, M.A. CCC-SLP [phone]

## 2013-07-09 NOTE — Progress Notes (Signed)
Occupational Therapy Treatment Patient Details Name: Alison Ward MRN: [id number] DOB: April 30, 1961 Today's Date: 07/09/2013 Time: 1209-1225 OT Time Calculation (min): 16 min  OT Assessment / Plan / Recommendation  History of present illness Alison Ward is an 52 y.o. female with a history of hypertension and poor compliance with taking medication, presenting with confusion and headache as well as lethargy. Patient was confused and called family members to get directions home. She was noted to be outside of the home but unknown resident. A pedestrian called EMS. Emergency services subsequently located family members. Blood pressure on arrival in the emergency room was 202/98. CT scan of her head showed a 4.1 x 3.1 cm R parietotemporal intracerebral infarction with surrounding edema and intraventricular extension of hemorrhage. Patient had a 6 mm right to left midline shift. There is no previous history of stroke nor TIA. Patient has not been on antiplatelet therapy. NIH stroke score was 20. 83 F with ICH and elavated ICP   OT comments  S/p R parietotemporal infarct. Session today limited by lethargy. Sister states that pt knew her this am and knew that she was in the hospital. Focus of session having pt follow commands and begin simple ADL tasks. Pt with weak grip bilaterally and was able to complete simple grooming tasks with mod vc for redirection. Motor inattention noted on L. Attempting to assess vision. Pt with difficulty tracking into L field. Will continue to assess. Will continue to see acutely.  Follow Up Recommendations  CIR    Barriers to Discharge       Equipment Recommendations  3 in 1 bedside comode    Recommendations for Other Services Rehab consult  Frequency Min 3X/week   Progress towards OT Goals Progress towards OT goals: Progressing toward goals  Plan Discharge plan remains appropriate;Frequency needs to be updated    Precautions / Restrictions  Precautions Precautions: Fall Precaution Comments: ventricular shunt    Pertinent Vitals/Pain .vitals stable    ADL       OT Diagnosis:    OT Problem List:   OT Treatment Interventions:     OT Goals(current goals can now be found in the care plan section) Acute Rehab OT Goals Patient Stated Goal: did not set OT Goal Formulation: With patient Time For Goal Achievement: 07/21/13 Potential to Achieve Goals: Good ADL Goals Pt Will Perform Grooming: with min assist;sitting Pt Will Transfer to Toilet: with min assist;ambulating;bedside commode Additional ADL Goal #1: Pt will be min A to come up to sit on EOB for BADLs and transfers Additional ADL Goal #2: Pt will follow one step commands 75% of time without delay  Visit Information  Last OT Received On: 07/09/13 Assistance Needed: +2 History of Present Illness: Alison Ward is an 52 y.o. female with a history of hypertension and poor compliance with taking medication, presenting with confusion and headache as well as lethargy. Patient was confused and called family members to get directions home. She was noted to be outside of the home but unknown resident. A pedestrian called EMS. Emergency services subsequently located family members. Blood pressure on arrival in the emergency room was 202/98. CT scan of her head showed a 4.1 x 3.1 cm parietotemporal intracerebral infarction with surrounding edema and intraventricular extension of hemorrhage. Patient had a 6 mm right to left midline shift. There is no previous history of stroke nor TIA. Patient has not been on antiplatelet therapy. NIH stroke score was 20. 4 F with ICH and elavated ICP  Subjective Data      Prior Functioning       Cognition  Cognition Arousal/Alertness: Lethargic Behavior During Therapy: Flat affect Overall Cognitive Status: Impaired/Different from baseline Area of Impairment: Attention;Memory;Following commands;Safety/judgement;Awareness;Problem  solving Orientation Level: Disoriented to;Time;Situation (sister stateds she was oriented to place this am) Current Attention Level: Focused (sister states earlier she was sustaning attention) Memory: Decreased recall of precautions;Decreased short-term memory Following Commands: Follows one step commands inconsistently Safety/Judgement: Decreased awareness of safety;Decreased awareness of deficits Awareness: Intellectual Problem Solving: Slow processing;Decreased initiation;Difficulty sequencing;Requires verbal cues;Requires tactile cues General Comments: lethargic this am, although nsg stated that she was alert.  Difficult to assess due to: Level of arousal    Mobility       Exercises      Balance     End of Session OT - End of Session Activity Tolerance: Patient limited by lethargy Patient left: in bed;with call bell/phone within reach;with family/visitor present Nurse Communication: Mobility status;Other (comment) (lethargy)  GO     Tauno Falotico,HILLARY 07/09/2013, 2:06 PM [facility], OTR/L  [phone] 07/09/2013

## 2013-07-09 NOTE — Progress Notes (Signed)
Upon patient assessment, I noticed that the foley catheter was no longer in her bladder, unsure of how it was removed. [name] MD and was told to bladder scan and if >250 ml then reinsert foley.  Bladder scan was 98 ml.  Will continue to monitor. Alison Ward

## 2013-07-09 NOTE — Progress Notes (Signed)
Stroke Team Progress Note  HISTORY Alison Ward is an 52 y.o. female with a history of hypertension and poor compliance with taking medication, presenting with confusion and headache as well as lethargy. Patient was confused and called family members to get directions home. She was noted to be outside of the home but unknown resident. A pedestrian called EMS. Emergency services subsequently located family members. Blood pressure on arrival in the emergency room was 202/98. CT scan of her head showed a 4.1 x 3.1 cm parietotemporal intracerebral infarction with surrounding edema and intraventricular extension of hemorrhage. Patient had a 6 mm right to left midline shift. There is no previous history of stroke nor TIA. Patient has not been on antiplatelet therapy. NIH stroke score was 20.  LSN: Unclear  tPA Given: No: ICH  MRankin: 4  Patient was not a TPA candidate secondary to ICH  She was admitted to the neuro ICU for further evaluation and treatment.  SUBJECTIVE Patient initially had to undergo placement of right frontal ventriculostomy via a burr hole but continued to have decreased mental status and on 07/07/2013 had to undergo placement of left frontal ventriculostomy via a burr hole and removal of right ventriculostomy. She is on 3% saline.   Patient easily awakes. She talks, oriented. Easy to tire.  OBJECTIVE Most recent Vital Signs: Filed Vitals:   07/09/13 0400 07/09/13 0500 07/09/13 0600 07/09/13 0700  BP: 154/90 166/98 156/100 151/89  Pulse: 98 107 110 93  Temp:      TempSrc:      Resp: 24 17 27 23   Height:      Weight:  109.1 kg (240 lb 8.4 oz)    SpO2: 95% 96% 93% 94%   CBG (last 3)  No results found for this basename: GLUCAP,  in the last 72 hours  IV Fluid Intake:   . niCARDipine Stopped (07/08/13 0930)  . sodium chloride (hypertonic) 75 mL/hr at 07/08/13 1401    MEDICATIONS  . antiseptic oral rinse  15 mL Mouth Rinse q12n4p  .  ceFAZolin (ANCEF) IV  2 g  Intravenous Q8H  . chlorhexidine  15 mL Mouth Rinse BID  . hydrochlorothiazide  25 mg Oral Daily  . lisinopril  5 mg Oral Daily  . pantoprazole (PROTONIX) IV  40 mg Intravenous QHS  . potassium chloride  30 mEq Oral Q4H  . senna-docusate  1 tablet Oral BID  . valproate sodium  500 mg Intravenous Q8H   PRN:  acetaminophen, acetaminophen, LORazepam, ondansetron  Diet:  NPO Activity:  Bedrest DVT Prophylaxis:  SCDs CLINICALLY SIGNIFICANT STUDIES Basic Metabolic Panel:   Recent Labs Lab 07/07/13 0345  07/08/13 2058 07/09/13 0258  NA 136  < > 153* 152*  K 3.4*  --   --  3.1*  CL 99  --   --  110  CO2 25  --   --  33*  GLUCOSE 112*  --   --  83  BUN 12  --   --  18  CREATININE 0.69  --   --  0.83  CALCIUM 9.3  --   --  9.5  MG  --   --   --  2.3  PHOS  --   --   --  3.1  < > = values in this interval not displayed. Liver Function Tests: No results found for this basename: AST, ALT, ALKPHOS, BILITOT, PROT, ALBUMIN,  in the last 168 hours CBC:   Recent Labs Lab 07/06/13 0210  07/07/13 0345  WBC 9.1 14.9*  HGB 13.1 13.3  HCT 38.8 39.4  MCV 83.8 82.9  PLT 215 217   Coagulation:   Recent Labs Lab 07/06/13 0210  LABPROT 12.5  INR 0.95   Cardiac Enzymes: No results found for this basename: CKTOTAL, CKMB, CKMBINDEX, TROPONINI,  in the last 168 hours Urinalysis:   Recent Labs Lab 07/06/13 0359  COLORURINE YELLOW  LABSPEC 1.009  PHURINE 8.0  GLUCOSEU 100*  HGBUR NEGATIVE  BILIRUBINUR NEGATIVE  KETONESUR NEGATIVE  PROTEINUR NEGATIVE  UROBILINOGEN 0.2  NITRITE NEGATIVE  LEUKOCYTESUR NEGATIVE   Lipid Panel    Component Value Date/Time   CHOL 244* 07/06/2013 0420   TRIG 153* 07/06/2013 0420   HDL 45 07/06/2013 0420   CHOLHDL 5.4 07/06/2013 0420   VLDL 31 07/06/2013 0420   LDLCALC 168* 07/06/2013 0420   HgbA1C  Lab Results  Component Value Date   HGBA1C 6.0* 07/06/2013    Urine Drug Screen:   No results found for this basename: labopia,  cocainscrnur,  labbenz,   amphetmu,  thcu,  labbarb    Alcohol Level: No results found for this basename: ETH,  in the last 168 hours  Ct Angio Head W/cm &/or Wo Cm 07/07/2013   A right parietal and temporal lobe hemorrhage is not significantly changed in size surrounding edema is as expected. Intraventricular hemorrhage is more pronounced than on the prior exam.  The right frontal ventriculostomy catheter is now in place. There is minimal hemorrhage along the shunt site.  Subarachnoid blood over the left parietal convexity is again noted.  There is no significant hydrocephalus.  The paranasal sinuses and mastoid air cells are clear.  The osseous skull is otherwise intact.  The postcontrast images demonstrate no pathologic enhancement.  CTA images demonstrate atherosclerotic calcifications within the cavernous carotid arteries bilaterally.  Enlarged empty sella is noted.  No significant vascular stenoses are evident.  The right A1 segment is dominant.  The anterior communicating artery is patent. The MCA bifurcations are within normal limits bilaterally.  The ACA and MCA branch vessels are normal.  No significant vascular lesions are associated with the area of hemorrhage.  The vertebral arteries are codominant.  The basilar artery is within normal limits.  Prominent posterior communicating arteries are present bilaterally.  P1 segments contribute as well, more equally on the left.  There is some irregularity of distal PCA branch vessels.  The dural sinuses are patent.   Review of the MIP images confirms the above findings.  IMPRESSION:  1.  Minimal distal small vessel disease. 2.  No significant vascular lesion associated with the area of hemorrhage. 3.  Stable right parietal parenchymal hemorrhage. 4.  Status post right to the tracheostomy placement without evidence for hydrocephalus. 5.  The extent of intraventricular hemorrhage has increased.     Dg Chest Port 1 View 07/07/2013   1.  Interval placement of left IJ line, tip overlying  the level of the superior vena cava - SVC confluence. 2.  No postprocedure pneumothorax. 3.  Cardiomegaly and vascular congestion.  07/07/2013  Increased pulmonary vascular congestion now with mild pulmonary edema.  Stable cardiomegaly.    MRI of the brain   MRA of the brain    2D Echocardiogram  EF 65%, no regional wall motion abnormality identified, no cardiac source of emboli identified.  Carotid Doppler  Findings suggest 1-39% stenosis bilaterally. Vertebral arteries are patent with antegrade flow.  CXR 07/06/13 Shallow inspiration. Mild cardiac enlargement. No evidence  of  active pulmonary disease.   EKG Sinus rhythm Probable LVH with secondary repol abnrm   Therapy Recommendations pending Physical Exam   Obese middle aged african american lady not in distress.Awake alert. Afebrile. Head is nontraumatic. Neck is supple without bruit. Hearing is normal. Cardiac exam no murmur or gallop. Lungs are clear to auscultation. Distal pulses are well felt. She has a right frontal ventriculostomy catheter Neurological Exam :  Drowsy but can be aroused  . No gaze deviation. Pupils equal reactive. Decrease blink to threat on the left. Disoriented to time and place. Diminished attention, registration and recall. Speech is  dysarthric. Follows commands well. Mild left lower facial weakness. Motor system exam reveals no upper or lower extremity drift but patient's cooperation is limited. I suspect mild left grip and hip flexor and ankle dorsiflexor weakness. Reflexes are symmetric. Left plantar is equivocal and right is downgoing. Gait was not tested.   ASSESSMENT [name] is a 52 y.o. female presenting with  Headache, confusion and vision changes secondary to large right pareital parenchymal hemorrhage with intraventricular extension mild cytotoxic edema and hydrocephalous-etiology likely hypertensive with accelerated hypertension. Patient initially had to undergo placement of right frontal  ventriculostomy via a burr hole but continued to have decreased mental status and on 07/07/2013 had to undergo placement of left frontal ventriculostomy via a burr hole and removal of right ventriculostomy. More awake today, talking and answering questions appropriately.  Patient unable to tolerate bedside CT Head yesterday; apparently was irritable only with this; she is to get one now.    Hyperlipidemia, add statin when able to take po/per tube, LDL 168 goal < 100 for non diabetics.  Cytotoxic edema, 3% saline-stop, patient awake, alert.  Accelerated Hypertension   Hospital day # 3  TREATMENT/PLAN  Continue strict control of hypertension with blood pressure goal below 160. Continue Cardene drip for now.  IV Depacon for headache.  Hypertonic saline protocol, Na+ 152-discontinue, neurologically improving  Chest portable head CT to look for change in hemorrhage size done; however patient would not lie still for test. Patient clinically better  Will ask speech to see patient again as well as PT/OT  Dr. Pearlean Brownie discussed the patients plan of care with family, CCM.  This patient is critically ill and at significant risk of neurological worsening, death and care requires constant monitoring of vital signs, hemodynamics,respiratory and cardiac monitoring,review of multiple databases, neurological assessment, discussion with family, other specialists and medical decision making of high complexity. I spent 35 minutes of neurocritical care time  in the care of  this patient.  Gwendolyn Lima. Manson Passey, [facility], MBA, MHA [facility] Pager: [phone] 07/09/2013 8:11 AM  I have personally obtained a history, examined the patient, evaluated imaging results, and formulated the assessment and plan of care. I agree with the above.  Delia Heady, MD

## 2013-07-09 NOTE — Progress Notes (Signed)
[facility] ADULT ICU REPLACEMENT PROTOCOL FOR AM LAB REPLACEMENT ONLY  The patient does apply for the [facility] Adult ICU Electrolyte Replacment Protocol based on the criteria listed below:   1. Is GFR >/= 40 ml/min? yes  Patient's GFR today is >90 2. Is urine output >/= 0.5 ml/kg/hr for the last 6 hours? yes Patient's UOP is .85 ml/kg/hr 3. Is BUN < 60 mg/dL? yes  Patient's BUN today is 18 4. Abnormal electrolyte(s):K 3.1 5. Ordered repletion with:per protocol 6. If a panic level lab has been reported, has the CCM MD in charge been notified? yes.   Physician:  Rollene Rotunda 07/09/2013 4:36 AM

## 2013-07-10 LAB — BASIC METABOLIC PANEL
BUN: 22 mg/dL (ref 6–23)
Chloride: 103 mEq/L (ref 96–112)
Creatinine, Ser: 0.89 mg/dL (ref 0.50–1.10)
GFR calc Af Amer: 84 mL/min — ABNORMAL LOW (ref >90)
GFR calc non Af Amer: 72 mL/min — ABNORMAL LOW (ref >90)

## 2013-07-10 MED ORDER — PANTOPRAZOLE SODIUM 40 MG PO TBEC
40.0000 mg | DELAYED_RELEASE_TABLET | Freq: Every day | ORAL | Status: DC
  Administered 2013-07-10 – 2013-07-21 (×12): 40 mg via ORAL
  Filled 2013-07-10 (×12): qty 1

## 2013-07-10 MED ORDER — POTASSIUM CHLORIDE 10 MEQ/50ML IV SOLN
10.0000 meq | INTRAVENOUS | Status: AC
  Administered 2013-07-10 (×6): 10 meq via INTRAVENOUS
  Filled 2013-07-10 (×7): qty 50

## 2013-07-10 NOTE — Progress Notes (Signed)
eLink Physician-Brief Progress Note Patient Name: Alison Ward DOB: 09/26/1961 MRN: [id number]  Date of Service  07/10/2013   HPI/Events of Note  Hypokalemia   eICU Interventions  Potassium replaced      Sareen Randon 07/10/2013, 6:25 AM

## 2013-07-10 NOTE — Progress Notes (Signed)
Physical Therapy Treatment Patient Details Name: Alison Ward MRN: [id number] DOB: [birth year] Today's Date: 07/10/2013 Time: 8657-8469 PT Time Calculation (min): 24 min  PT Assessment / Plan / Recommendation  History of Present Illness Alison Ward is an 52 y.o. female with a history of hypertension and poor compliance with taking medication, presenting with confusion and headache as well as lethargy. Patient was confused and called family members to get directions home. She was noted to be outside of the home but unknown resident. A pedestrian called EMS. Emergency services subsequently located family members. Blood pressure on arrival in the emergency room was 202/98. CT scan of her head showed a 4.1 x 3.1 cm parietotemporal intracerebral infarction with surrounding edema and intraventricular extension of hemorrhage. Patient had a 6 mm right to left midline shift. There is no previous history of stroke nor TIA. Patient has not been on antiplatelet therapy. NIH stroke score was 20. 47 F with ICH and elavated ICP   PT Comments   Pt admitted with above. Pt currently with functional limitations due to poor postural stability and poor cognition.  Pt very conversant and joking on arrival.  Once sitting at EOB, pt became more lethargic and was not able to follow commands unless constantly cued.  Assisted to chair and pt fell asleep quickly.  Notified nursing of change in mental status with OOB.  Nursing states this is common for pt.   Pt will benefit from skilled PT to increase their independence and safety with mobility to allow discharge to the venue listed below.   Follow Up Recommendations  CIR;Supervision/Assistance - 24 hour                 Equipment Recommendations  Other (comment) (TBA)    Recommendations for Other Services Rehab consult  Frequency Min 3X/week   Progress towards PT Goals Progress towards PT goals: Progressing toward goals  Plan Current plan remains appropriate     Precautions / Restrictions Precautions Precautions: Fall Precaution Comments: ventricular shunt  Restrictions Weight Bearing Restrictions: No   Pertinent Vitals/Pain VSS with BP stable , no pain    Mobility  Bed Mobility Bed Mobility: Rolling Left;Left Sidelying to Sit;Sitting - Scoot to [place] of Bed Rolling Left: 3: Mod assist Left Sidelying to Sit: 1: +2 Total assist;HOB elevated Left Sidelying to Sit: Patient Percentage: 50% Supine to Sit: Not tested (comment) Sitting - Scoot to Edge of Bed: 3: Mod assist Sit to Supine: Not Tested (comment) Scooting to [facility]: Not tested (comment) Details for Bed Mobility Assistance: Pt able to assist to come EOB but once EOB, more lethargic and had difficulty with arousal.   Transfers Transfers: Sit to Stand;Stand to Sit;Stand Pivot Transfers Sit to Stand: 1: +2 Total assist;With upper extremity assist;From bed Sit to Stand: Patient Percentage: 60% Stand to Sit: 1: +2 Total assist;With upper extremity assist;With armrests;To chair/3-in-1 Stand to Sit: Patient Percentage: 60% Stand Pivot Transfers: 1: +2 Total assist Stand Pivot Transfers: Patient Percentage: 60% Lateral/Scoot Transfers: Not tested (comment) Details for Transfer Assistance: Pt able to stand with assist for hand placement and use of gait belt to cue pt to initiate movement.  Once up in standing, pt needed assist for weight shifting and constant cues to take pivotal steps to recliner.  Pt with heavy posterior lean at times.  appears to have difficulty understanding commands.   Ambulation/Gait Ambulation/Gait Assistance: Not tested (comment) Stairs: No Wheelchair Mobility Wheelchair Mobility: No Modified Rankin (Stroke Patients Only) Pre-Morbid Rankin Score:  No symptoms Modified Rankin: Severe disability     PT Goals (current goals can now be found in the care plan section)    Visit Information  Last PT Received On: 07/10/13 Assistance Needed: +2 History of Present Illness:  Alison Ward is an 52 y.o. female with a history of hypertension and poor compliance with taking medication, presenting with confusion and headache as well as lethargy. Patient was confused and called family members to get directions home. She was noted to be outside of the home but unknown resident. A pedestrian called EMS. Emergency services subsequently located family members. Blood pressure on arrival in the emergency room was 202/98. CT scan of her head showed a 4.1 x 3.1 cm parietotemporal intracerebral infarction with surrounding edema and intraventricular extension of hemorrhage. Patient had a 6 mm right to left midline shift. There is no previous history of stroke nor TIA. Patient has not been on antiplatelet therapy. NIH stroke score was 20. 60 F with ICH and elavated ICP    Subjective Data  Subjective: Talking and joking on arrival with sister.     Cognition  Cognition Arousal/Alertness: Awake/alert Behavior During Therapy: Flat affect Overall Cognitive Status: Impaired/Different from baseline Area of Impairment: Attention;Memory;Following commands;Safety/judgement;Awareness;Problem solving Orientation Level: Place;Time;Situation Current Attention Level: Focused Memory: Decreased recall of precautions;Decreased short-term memory Following Commands: Follows one step commands inconsistently Safety/Judgement: Decreased awareness of safety;Decreased awareness of deficits Awareness: Intellectual Problem Solving: Slow processing;Decreased initiation;Difficulty sequencing;Requires verbal cues;Requires tactile cues General Comments: Pt initially talking and joking on arrival.  After sitting pt up on EOB, pt more lethargic with decr following commands.   Difficult to assess due to: Level of arousal    Balance  Static Sitting Balance Static Sitting - Balance Support: Bilateral upper extremity supported;Feet supported Static Sitting - Level of Assistance: 2: Max assist;3: Mod  assist Static Sitting - Comment/# of Minutes: Sat 5 min at EOB with mod to max assist.  Loses balance all directions with posterior lean worse.  She can correct posture with cuing but cannot maintain.    End of Session PT - End of Session Equipment Utilized During Treatment: Gait belt Activity Tolerance: Patient limited by lethargy Patient left: in chair;with call bell/phone within reach;with family/visitor present Nurse Communication: Mobility status        INGOLD,Etheridge Geil 07/10/2013, 2:03 PM [facility] Acute Rehabilitation [phone] (pager)

## 2013-07-10 NOTE — Progress Notes (Addendum)
Stroke Team Progress Note  HISTORY Alison Ward is an 52 y.o. female with a history of hypertension and poor compliance with taking medication, presenting with confusion and headache as well as lethargy. Patient was confused and called family members to get directions home. She was noted to be outside of the home but unknown resident. A pedestrian called EMS. Emergency services subsequently located family members. Blood pressure on arrival in the emergency room was 202/98. CT scan of her head showed a 4.1 x 3.1 cm parietotemporal intracerebral infarction with surrounding edema and intraventricular extension of hemorrhage. Patient had a 6 mm right to left midline shift. There is no previous history of stroke nor TIA. Patient has not been on antiplatelet therapy. NIH stroke score was 20.  LSN: Unclear  tPA Given: No: ICH  MRankin: 4  Patient was not a TPA candidate secondary to ICH  She was admitted to the neuro ICU for further evaluation and treatment.  Patient initially had to undergo placement of right frontal ventriculostomy via a burr hole but continued to have decreased mental status and on 07/07/2013 had to undergo placement of left frontal ventriculostomy via a burr hole and removal of right ventriculostomy.   SUBJECTIVE  Still with ventriculostomy. Awake following some commands. Wearing her glasses. Recognizing persons in room.  OBJECTIVE Most recent Vital Signs: Filed Vitals:   07/10/13 1300 07/10/13 1400 07/10/13 1500 07/10/13 1543  BP: 146/92 132/79 148/89   Pulse: 89 79 72   Temp:    98.3 F (36.8 C)  TempSrc:    Oral  Resp: 17 19 16    Height:      Weight:      SpO2: 97% 95% 92%    CBG (last 3)  No results found for this basename: GLUCAP,  in the last 72 hours  IV Fluid Intake:   . niCARDipine Stopped (07/08/13 0930)    MEDICATIONS  .  ceFAZolin (ANCEF) IV  2 g Intravenous Q8H  . hydrochlorothiazide  25 mg Oral Daily  . lisinopril  5 mg Oral Daily  .  pantoprazole  40 mg Oral Daily  . potassium chloride  10 mEq Intravenous Q1 Hr x 6  . senna-docusate  1 tablet Oral BID  . valproate sodium  500 mg Intravenous Q8H   PRN:  acetaminophen, acetaminophen, labetalol, LORazepam, ondansetron, oxyCODONE  Diet:  General Activity:  Bedrest DVT Prophylaxis:  SCDs CLINICALLY SIGNIFICANT STUDIES Basic Metabolic Panel:   Recent Labs Lab 07/08/13 2058 07/09/13 0258 07/09/13 1000 07/10/13 0500  NA 153* 152* 149* 146*  K  --  3.1*  --  2.9*  CL  --  110  --  103  CO2  --  33*  --  34*  GLUCOSE  --  83  --  84  BUN  --  18  --  22  CREATININE  --  0.83  --  0.89  CALCIUM  --  9.5  --  9.7  MG  --  2.3  --   --   PHOS  --  3.1  --   --    Liver Function Tests: No results found for this basename: AST, ALT, ALKPHOS, BILITOT, PROT, ALBUMIN,  in the last 168 hours CBC:   Recent Labs Lab 07/07/13 0345 07/09/13 2100  WBC 14.9* 9.5  HGB 13.3 13.8  HCT 39.4 42.1  MCV 82.9 86.8  PLT 217 235   Coagulation:   Recent Labs Lab 07/06/13 0210  LABPROT 12.5  INR  0.95   Cardiac Enzymes: No results found for this basename: CKTOTAL, CKMB, CKMBINDEX, TROPONINI,  in the last 168 hours Urinalysis:   Recent Labs Lab 07/06/13 0359  COLORURINE YELLOW  LABSPEC 1.009  PHURINE 8.0  GLUCOSEU 100*  HGBUR NEGATIVE  BILIRUBINUR NEGATIVE  KETONESUR NEGATIVE  PROTEINUR NEGATIVE  UROBILINOGEN 0.2  NITRITE NEGATIVE  LEUKOCYTESUR NEGATIVE   Lipid Panel    Component Value Date/Time   CHOL 244* 07/06/2013 0420   TRIG 153* 07/06/2013 0420   HDL 45 07/06/2013 0420   CHOLHDL 5.4 07/06/2013 0420   VLDL 31 07/06/2013 0420   LDLCALC 168* 07/06/2013 0420   HgbA1C  Lab Results  Component Value Date   HGBA1C 6.0* 07/06/2013    Urine Drug Screen:   No results found for this basename: labopia,  cocainscrnur,  labbenz,  amphetmu,  thcu,  labbarb    Alcohol Level: No results found for this basename: ETH,  in the last 168 hours  Ct Angio Head W/cm &/or Wo  Cm 07/07/2013   A right parietal and temporal lobe hemorrhage is not significantly changed in size surrounding edema is as expected. Intraventricular hemorrhage is more pronounced than on the prior exam.  The right frontal ventriculostomy catheter is now in place. There is minimal hemorrhage along the shunt site.  Subarachnoid blood over the left parietal convexity is again noted.  There is no significant hydrocephalus.  The paranasal sinuses and mastoid air cells are clear.  The osseous skull is otherwise intact.  The postcontrast images demonstrate no pathologic enhancement.  CTA images demonstrate atherosclerotic calcifications within the cavernous carotid arteries bilaterally.  Enlarged empty sella is noted.  No significant vascular stenoses are evident.  The right A1 segment is dominant.  The anterior communicating artery is patent. The MCA bifurcations are within normal limits bilaterally.  The ACA and MCA branch vessels are normal.  No significant vascular lesions are associated with the area of hemorrhage.  The vertebral arteries are codominant.  The basilar artery is within normal limits.  Prominent posterior communicating arteries are present bilaterally.  P1 segments contribute as well, more equally on the left.  There is some irregularity of distal PCA branch vessels.  The dural sinuses are patent.   Review of the MIP images confirms the above findings.  IMPRESSION:  1.  Minimal distal small vessel disease. 2.  No significant vascular lesion associated with the area of hemorrhage. 3.  Stable right parietal parenchymal hemorrhage. 4.  Status post right to the tracheostomy placement without evidence for hydrocephalus. 5.  The extent of intraventricular hemorrhage has increased.     CT Wo Contrast PORTABLE 07/09/2013 Markedly limited exam secondary to motion. There appears to be new dependent left ventricular hemorrhage. Right ventricle hemorrhage and right parietal - posterior temporal hematoma once  again noted.   Dg Chest Port 1 View 07/07/2013   1.  Interval placement of left IJ line, tip overlying the level of the superior vena cava - SVC confluence. 2.  No postprocedure pneumothorax. 3.  Cardiomegaly and vascular congestion.  07/07/2013  Increased pulmonary vascular congestion now with mild pulmonary edema.  Stable cardiomegaly.    MRI of the brain   MRA of the brain    2D Echocardiogram  EF 65%, no regional wall motion abnormality identified, no cardiac source of emboli identified.  Carotid Doppler  Findings suggest 1-39% stenosis bilaterally. Vertebral arteries are patent with antegrade flow.  CXR 07/06/13 Shallow inspiration. Mild cardiac enlargement. No evidence of  active pulmonary disease.   EKG Sinus rhythm Probable LVH with secondary repol abnrm   Therapy Recommendations pending Physical Exam   Obese middle aged african american lady not in distress.Awake alert. Afebrile. Head is nontraumatic. Neck is supple without bruit. Hearing is normal. Cardiac exam no murmur or gallop. Lungs are clear to auscultation. Distal pulses are well felt. She has a right frontal ventriculostomy catheter Neurological Exam :  Awake  . No gaze deviation. Pupils equal reactive. Decrease blink to threat on the left. Disoriented to time and place. Diminished attention, registration and recall. Speech is  dysarthric. Follows commands well. Mild left lower facial weakness. Motor system exam reveals no upper or lower extremity drift but patient's cooperation is limited. I suspect mild left grip and hip flexor and ankle dorsiflexor weakness. Reflexes are symmetric. Left plantar is equivocal and right is downgoing. Gait was not tested.   ASSESSMENT Alison Ward is a 51 y.o. female presenting with  Headache, confusion and vision changes secondary to large right pareital parenchymal hemorrhage with intraventricular extension mild cytotoxic edema and hydrocephalous-etiology likely hypertensive with  accelerated hypertension. Patient initially had to undergo placement of right frontal ventriculostomy via a burr hole but continued to have decreased mental status and on 07/07/2013 had to undergo placement of left frontal ventriculostomy via a burr hole and removal of right ventriculostomy. More awake today, talking and answering questions appropriately. Reconizing family.  Patient able to tolerate CT 07/09/2013 There appears to be new dependent left ventricular hemorrhage, in addition to previously known hemorrhagic areas.    Hyperlipidemia, add statin when able to take po/per tube, LDL 168 goal < 100 for non diabetics.  Cytotoxic edema, 3% saline-discontinued.  Accelerated Hypertension  Right ICH, Intraventricular hemorrhage with hydrocephalus  Long term medication use   Hospital day # 4  TREATMENT/PLAN  Continue strict control of hypertension with blood pressure goal below 160. Continue Cardene drip for now.  IV [place]acon for headache.  Chest portable head CT  CIR: plans for keeping ventriculostomy in place into next week  Speech: regular, thin diet.  Dr. Pearlean Brownie discussed the patients plan of care with family, CCM.   This patient is critically ill and at significant risk of neurological worsening, death and care requires constant monitoring of vital signs, hemodynamics,respiratory and cardiac monitoring,review of multiple databases, neurological assessment, discussion with family, other specialists and medical decision making of high complexity. I spent 30 minutes of neurocritical care time  in the care of  this patient.  Gwendolyn Lima. Manson Passey, [facility], MBA, MHA [facility] Pager: [phone] 07/10/2013 4:06 PM  I have personally obtained a history, examined the patient, evaluated imaging results, and formulated the assessment and plan of care. I agree with the above.  Delia Heady, MD

## 2013-07-10 NOTE — Progress Notes (Signed)
Patient ID: Alison Ward, female   DOB: 10/21/1961, 52 y.o.   MRN: [id number] Subjective:  The patient is alert and pleasant. She looks better. She has no complaints.  Objective: Vital signs in last 24 hours: Temp:  [97.9 F (36.6 C)-98.9 F (37.2 C)] 98 F (36.7 C) (09/11 0752) Pulse Rate:  [66-103] 66 (09/11 0900) Resp:  [7-27] 13 (09/11 0900) BP: (140-180)/(84-127) 157/84 mmHg (09/11 0900) SpO2:  [91 %-97 %] 91 % (09/11 0900) Weight:  [108.3 kg (238 lb 12.1 oz)] 108.3 kg (238 lb 12.1 oz) (09/11 0500)  Intake/Output from previous day: 09/10 0701 - 09/11 0700 In: 1010 [P.O.:860; IV Piggyback:150] Out: 405 [Urine:150; Drains:255] Intake/Output this shift: Total I/O In: -  Out: 41 [Drains:49]  Physical exam the patient is alert and oriented x3. She is moving all 4 extremities well. Her pupils are equal. Her drain is patent and draining bloody spinal fluid.  Lab Results:  Recent Labs  07/09/13 2100  WBC 9.5  HGB 13.8  HCT 42.1  PLT 235   BMET  Recent Labs  07/09/13 0258 07/09/13 1000 07/10/13 0500  NA 152* 149* 146*  K 3.1*  --  2.9*  CL 110  --  103  CO2 33*  --  34*  GLUCOSE 83  --  84  BUN 18  --  22  CREATININE 0.83  --  0.89  CALCIUM 9.5  --  9.7    Studies/Results: Ct Portable Head W/o Cm  07/09/2013   *RADIOLOGY REPORT*  Clinical Data: Followup intracranial hemorrhage.  CT HEAD WITHOUT CONTRAST  Technique:  Contiguous axial images were obtained from the base of the skull through the vertex without contrast.  Comparison: 07/07/2013.  Findings: Exam was performed portably.  The patient sat up during examination and there is significant motion degradation.  Further imaging was cancelled by Dr. Pearlean Brownie.  Exam is limited with respect to evaluating for any significant change given the degree of motion.  Right parietal - posterior temporal lobe hemorrhage is noted. Blood is seen within the ventricles with shunt catheter in place. When compared to the prior  examination, now seen is blood within the left lateral ventricle and not noted on the prior exam.  Limited for evaluating for progressive parenchymal hemorrhage, infarct, evaluating for progressive hydrocephalus or detecting underlying mass.  IMPRESSION: Markedly limited exam secondary to motion.  There appears to be new dependent left ventricular hemorrhage.  Right ventricle hemorrhage and right parietal - posterior temporal hematoma once again noted.   Original Report Authenticated By: Lacy Duverney, M.D.    Assessment/Plan: Right intracerebral hemorrhage, intraventricular hemorrhage, hydrocephalus: The patient is doing well clinically. We will continue her ventriculostomy into next week. I've answered all the patient's family's questions.  LOS: 4 days     Reily Ilic D 07/10/2013, 10:50 AM

## 2013-07-10 NOTE — Progress Notes (Signed)
UR completed 

## 2013-07-10 NOTE — Progress Notes (Signed)
PULMONARY  / CRITICAL CARE MEDICINE  Name: Alison Ward MRN: [id number] DOB: May 09, 1961    ADMISSION DATE:  07/06/2013 CONSULTATION DATE:  07/06/2013  REFERRING MD :  Dr. Lavella Lemons PRIMARY SERVICE: CCM  CHIEF COMPLAINT:  AMS  BRIEF PATIENT DESCRIPTION: 31 F with ICH a r/t HTN and elevated ICP.  SIGNIFICANT EVENTS / STUDIES: ICH 07/06/2013 s/p ventric ventric removed and replaced 9/8 3% saline started 9/8, stopped 9/10  CULTURES: None  ANTIBIOTICS: Cefazolin 9/7 >>    SUBJECTIVE:  3% saline stopped 9/10 More awake   VITAL SIGNS: Temp:  [97.9 F (36.6 C)-98.9 F (37.2 C)] 98 F (36.7 C) (09/11 0752) Pulse Rate:  [66-103] 66 (09/11 0900) Resp:  [7-27] 13 (09/11 0900) BP: (140-180)/(84-127) 157/84 mmHg (09/11 0900) SpO2:  [91 %-97 %] 91 % (09/11 0900) Weight:  [238 lb 12.1 oz (108.3 kg)] 238 lb 12.1 oz (108.3 kg) (09/11 0500)  HEMODYNAMICS:    VENTILATOR SETTINGS:    INTAKE / OUTPUT: Intake/Output     09/10 0701 - 09/11 0700 09/11 0701 - 09/12 0700   P.O. 860    I.V. (mL/kg)     IV Piggyback 150    Total Intake(mL/kg) 1010 (9.3)    Urine (mL/kg/hr) 150 (0.1)    Drains 255 (0.1) 40 (0.1)   Total Output 405 40   Net +605 -40        Urine Occurrence 6 x      PHYSICAL EXAMINATION: General:  Obese F in NAD Neuro:  Awake and interacting, a little slow to respond but appropriate, falls asleep easily  HEENT:  Sclera anicteric, pupil equal and reactive bilaterally, MMM, OP clear, L ventric  Neck:  Trachea supple and midline, (-) JVD, (-) LAN Cardiovascular:  RRR, NS1/S2, (-) MRG Lungs:  resps even non labored on , CTAB Abdomen:  S/NT/ND/(+)BS Musculoskeletal:  (-) C/C/E, OA toes biltaerally Skin:  Intact  LABS:  CBC Recent Labs     07/09/13  2100  WBC  9.5  HGB  13.8  HCT  42.1  PLT  235    Coag's No results found for this basename: APTT, INR,  in the last 72 hours  BMET Recent Labs     07/09/13  0258  07/09/13  1000  07/10/13  0500  NA   152*  149*  146*  K  3.1*   --   2.9*  CL  110   --   103  CO2  33*   --   34*  BUN  18   --   22  CREATININE  0.83   --   0.89  GLUCOSE  83   --   84    Electrolytes Recent Labs     07/09/13  0258  07/10/13  0500  CALCIUM  9.5  9.7  MG  2.3   --   PHOS  3.1   --     Sepsis Markers No results found for this basename: LACTICACIDVEN, PROCALCITON, O2SATVEN,  in the last 72 hours  ABG No results found for this basename: PHART, PCO2ART, PO2ART,  in the last 72 hours  Liver Enzymes No results found for this basename: AST, ALT, ALKPHOS, BILITOT, ALBUMIN,  in the last 72 hours  Cardiac Enzymes No results found for this basename: TROPONINI, PROBNP,  in the last 72 hours  Glucose No results found for this basename: GLUCAP,  in the last 72 hours  Imaging Ct Portable Head W/o Cm  07/09/2013   *  RADIOLOGY REPORT*  Clinical Data: Followup intracranial hemorrhage.  CT HEAD WITHOUT CONTRAST  Technique:  Contiguous axial images were obtained from the base of the skull through the vertex without contrast.  Comparison: 07/07/2013.  Findings: Exam was performed portably.  The patient sat up during examination and there is significant motion degradation.  Further imaging was cancelled by Dr. Pearlean Brownie.  Exam is limited with respect to evaluating for any significant change given the degree of motion.  Right parietal - posterior temporal lobe hemorrhage is noted. Blood is seen within the ventricles with shunt catheter in place. When compared to the prior examination, now seen is blood within the left lateral ventricle and not noted on the prior exam.  Limited for evaluating for progressive parenchymal hemorrhage, infarct, evaluating for progressive hydrocephalus or detecting underlying mass.  IMPRESSION: Markedly limited exam secondary to motion.  There appears to be new dependent left ventricular hemorrhage.  Right ventricle hemorrhage and right parietal - posterior temporal hematoma once again noted.    Original Report Authenticated By: Lacy Duverney, M.D.     CXR:  No new CXR 9/11  ASSESSMENT / PLAN: Principal Problem:   Intracerebral hemorrhage Active Problems:   HYPERTENSION   PULMONARY A:  No active issues P:   Follow airway protection, secretion management closely, she is protecting adequately currently and mental status improving   CARDIOVASCULAR A:  HTN: Intrinsic + reactive. Goal SBP <= 140 P:  Nicardipine Drip off 9/8 Cont hctz, lisinopril   RENAL A:  No Acute Issues  P:   F/u BMP  GASTROINTESTINAL A: Tol PO's   HEMATOLOGIC A:   No Acute Issues F/u cbc   INFECTIOUS A: Cefazolin 9/7 while ventric drain is in  ENDOCRINE A:   No Acute Issues  NEUROLOGIC A:  Intracerebral Hemorrhage Intraventricular blood and enlargement s/p ventriculostomy 9/7 and replaced 9/8 Continued somnolence 9/8 - mental status improving 9/11  P:   ventric replaced 9/8 3% saline protocol stopped 9/10 Cont Valproate  Serial Neuro Exams improving Out of ICU when ventric able to be removed F/u imaging per neurology's plans  Discussed status and plans with pt and daughters at bedside.   TODAY'S SUMMARY:   I have personally obtained a history, examined the patient, evaluated laboratory and imaging results, formulated the assessment and plan and placed orders.    Danford Bad, NP 07/10/2013  9:49 AM Pager: [phone] or [phone]  *Care during the described time interval was provided by me and/or other providers on the critical care team. I have reviewed this patient's available data, including medical history, events of note, physical examination and test results as part of my evaluation.  Levy Pupa, MD, PhD 07/10/2013, 10:22 AM [facility] [phone] or if no answer [phone]

## 2013-07-11 DIAGNOSIS — I619 Nontraumatic intracerebral hemorrhage, unspecified: Secondary | ICD-10-CM

## 2013-07-11 LAB — HEPATIC FUNCTION PANEL
ALT: 7 U/L (ref 0–35)
AST: 16 U/L (ref 0–37)
Albumin: 3 g/dL — ABNORMAL LOW (ref 3.5–5.2)
Alkaline Phosphatase: 52 U/L (ref 39–117)
Total Protein: 7.1 g/dL (ref 6.0–8.3)

## 2013-07-11 LAB — BASIC METABOLIC PANEL
BUN: 13 mg/dL (ref 6–23)
CO2: 36 mEq/L — ABNORMAL HIGH (ref 19–32)
CO2: 33 mEq/L — ABNORMAL HIGH (ref 19–32)
Calcium: 9.3 mg/dL (ref 8.4–10.5)
Calcium: 9.4 mg/dL (ref 8.4–10.5)
Chloride: 97 mEq/L (ref 96–112)
Glucose, Bld: 97 mg/dL (ref 70–99)
Glucose, Bld: 136 mg/dL — ABNORMAL HIGH (ref 70–99)
Potassium: 2.9 mEq/L — ABNORMAL LOW (ref 3.5–5.1)
Sodium: 140 mEq/L (ref 135–145)
Sodium: 136 mEq/L (ref 135–145)

## 2013-07-11 LAB — CBC
Hemoglobin: 12.9 g/dL (ref 12.0–15.0)
Platelets: 201 10*3/uL (ref 150–400)
RBC: 4.62 MIL/uL (ref 3.87–5.11)
WBC: 6.1 10*3/uL (ref 4.0–10.5)

## 2013-07-11 MED ORDER — POTASSIUM CHLORIDE 10 MEQ/50ML IV SOLN
10.0000 meq | INTRAVENOUS | Status: AC
  Administered 2013-07-11 – 2013-07-12 (×5): 10 meq via INTRAVENOUS
  Filled 2013-07-11 (×5): qty 50

## 2013-07-11 MED ORDER — POTASSIUM CHLORIDE 10 MEQ/50ML IV SOLN
10.0000 meq | INTRAVENOUS | Status: AC
Start: 2013-07-11 — End: 2013-07-11
  Administered 2013-07-11 (×5): 10 meq via INTRAVENOUS
  Filled 2013-07-11 (×5): qty 50

## 2013-07-11 MED ORDER — POTASSIUM CHLORIDE 10 MEQ/50ML IV SOLN
INTRAVENOUS | Status: AC
  Filled 2013-07-11: qty 50

## 2013-07-11 MED ORDER — DIVALPROEX SODIUM 500 MG PO DR TAB
500.0000 mg | DELAYED_RELEASE_TABLET | Freq: Three times a day (TID) | ORAL | Status: DC
  Administered 2013-07-11 – 2013-07-18 (×21): 500 mg via ORAL
  Filled 2013-07-11 (×25): qty 1

## 2013-07-11 NOTE — Progress Notes (Addendum)
PULMONARY  / CRITICAL CARE MEDICINE  Name: Alison Ward MRN: [id number] DOB: 12-Jul-1961    ADMISSION DATE:  07/06/2013 CONSULTATION DATE:  07/06/2013  REFERRING MD :  Dr. Lavella Lemons PRIMARY SERVICE: CCM  CHIEF COMPLAINT:  AMS  BRIEF PATIENT DESCRIPTION: 65 F with ICH a r/t HTN and elevated ICP.  SIGNIFICANT EVENTS / STUDIES: ICH 07/06/2013 s/p ventric ventric removed and replaced 9/8 Lovell Sheehan) 3% saline started 9/8, stopped 9/10  CULTURES: None  ANTIBIOTICS: Cefazolin 9/7 >> 9/12   SUBJECTIVE:  Continues to progress, interact more clearly ventric still in place  VITAL SIGNS: Temp:  [98.3 F (36.8 C)-99.2 F (37.3 C)] 98.9 F (37.2 C) (09/12 0358) Pulse Rate:  [70-89] 70 (09/12 1030) Resp:  [0-75] 9 (09/12 1030) BP: (132-171)/(79-110) 141/94 mmHg (09/12 1030) SpO2:  [92 %-99 %] 92 % (09/12 1030) Weight:  [110.7 kg (244 lb 0.8 oz)] 110.7 kg (244 lb 0.8 oz) (09/12 0500)  HEMODYNAMICS:    VENTILATOR SETTINGS:    INTAKE / OUTPUT: Intake/Output     09/11 0701 - 09/12 0700 09/12 0701 - 09/13 0700   P.O. 120    IV Piggyback 400 100   Total Intake(mL/kg) 520 (4.7) 100 (0.9)   Urine (mL/kg/hr)     Drains 349 (0.1) 40 (0.1)   Total Output 349 40   Net +171 +60        Urine Occurrence 9 x      PHYSICAL EXAMINATION: General:  Obese F in NAD Neuro:  Awake and interacting, a little slow to respond but appropriate, falls asleep easily  HEENT:  Sclera anicteric, pupil equal and reactive bilaterally, MMM, OP clear, L ventric  Neck:  Trachea supple and midline, (-) JVD, (-) LAN Cardiovascular:  RRR, NS1/S2, (-) MRG Lungs:  resps even non labored on [place], CTAB Abdomen:  S/NT/ND/(+)BS Musculoskeletal:  (-) C/C/E, OA toes biltaerally Skin:  Intact  LABS:  CBC Recent Labs     07/09/13  2100  07/11/13  0500  WBC  9.5  6.1  HGB  13.8  12.9  HCT  42.1  39.3  PLT  235  201    Coag's No results found for this basename: APTT, INR,  in the last 72 hours  BMET Recent  Labs     07/09/13  0258  07/09/13  1000  07/10/13  0500  07/11/13  0500  NA  152*  149*  146*  140  K  3.1*   --   2.9*  2.9*  CL  110   --   103  97  CO2  33*   --   34*  36*  BUN  18   --   22  16  CREATININE  0.83   --   0.89  0.86  GLUCOSE  83   --   84  97    Electrolytes Recent Labs     07/09/13  0258  07/10/13  0500  07/11/13  0500  CALCIUM  9.5  9.7  9.3  MG  2.3   --    --   PHOS  3.1   --    --     Sepsis Markers No results found for this basename: LACTICACIDVEN, PROCALCITON, O2SATVEN,  in the last 72 hours  ABG No results found for this basename: PHART, PCO2ART, PO2ART,  in the last 72 hours  Liver Enzymes Recent Labs     07/11/13  0500  AST  16  ALT  7  ALKPHOS  52  BILITOT  0.5  ALBUMIN  3.0*    Cardiac Enzymes No results found for this basename: TROPONINI, PROBNP,  in the last 72 hours  Glucose No results found for this basename: GLUCAP,  in the last 72 hours  Imaging No results found.   CXR:  No new CXR 9/11  ASSESSMENT / PLAN: Principal Problem:   Intracerebral hemorrhage Active Problems:   HYPERTENSION   PULMONARY A:  No active issues P:   Follow airway protection, secretion management closely, she is protecting adequately currently and mental status improving   CARDIOVASCULAR A:  HTN: Intrinsic + reactive. Goal SBP <= 140 P:  Nicardipine Drip off 9/8 Cont hctz, lisinopril   RENAL A:  Hypokalemia  P:   Replaced K F/u BMP  GASTROINTESTINAL A: Tol PO's   HEMATOLOGIC A:   No Acute Issues F/u cbc   INFECTIOUS A: Cefazolin 9/7 > 9/12 for ventric prophylaxis  ENDOCRINE A:   No Acute Issues  NEUROLOGIC A:  Intracerebral Hemorrhage Intraventricular blood and enlargement s/p ventriculostomy 9/7 and replaced 9/8  P:   3% saline protocol stopped 9/10 Cont Valproate  Serial Neuro Exams improving Out of ICU when ventric able to be removed F/u imaging per neurology's plans  Discussed status and plans  with pt and daughters at bedside.   TODAY'S SUMMARY:   I have personally obtained a history, examined the patient, evaluated laboratory and imaging results, formulated the assessment and plan and placed orders.    *Care during the described time interval was provided by me and/or other providers on the critical care team. I have reviewed this patient's available data, including medical history, events of note, physical examination and test results as part of my evaluation.  Levy Pupa, MD, PhD 07/11/2013, 11:32 AM [facility] [phone] or if no answer [phone]

## 2013-07-11 NOTE — Progress Notes (Signed)
eLink Physician-Brief Progress Note Patient Name: Alison Ward DOB: July 24, 1961 MRN: [id number]  Date of Service  07/11/2013   HPI/Events of Note     eICU Interventions  Hypokalemia -repleted    Intervention Category Minor Interventions: Electrolytes abnormality - evaluation and management  [name] V. 07/11/2013, 6:31 AM

## 2013-07-11 NOTE — Progress Notes (Signed)
Stroke Team Progress Note  HISTORY Alison Ward is an 52 y.o. female with a history of hypertension and poor compliance with taking medication, presenting with confusion and headache as well as lethargy. Patient was confused and called family members to get directions home. She was noted to be outside of the home but unknown resident. A pedestrian called EMS. Emergency services subsequently located family members. Blood pressure on arrival in the emergency room was 202/98. CT scan of her head showed a 4.1 x 3.1 cm parietotemporal intracerebral infarction with surrounding edema and intraventricular extension of hemorrhage. Patient had a 6 mm right to left midline shift. There is no previous history of stroke nor TIA. Patient has not been on antiplatelet therapy. NIH stroke score was 20.  LSN: Unclear  tPA Given: No: ICH  MRankin: 4  Patient was not a TPA candidate secondary to ICH  She was admitted to the neuro ICU for further evaluation and treatment.  Patient initially had to undergo placement of right frontal ventriculostomy via a burr hole but continued to have decreased mental status and on 07/07/2013 had to undergo placement of left frontal ventriculostomy via a burr hole and removal of right ventriculostomy.   SUBJECTIVE  Still with ventriculostomy. Awake following some commands. Recognizing persons in room. Now eating foods.  OBJECTIVE Most recent Vital Signs: Filed Vitals:   07/11/13 0358 07/11/13 0400 07/11/13 0500 07/11/13 0600  BP:  149/89 152/91 160/103  Pulse:  76 73 86  Temp: 98.9 F (37.2 C)     TempSrc: Oral     Resp:  22 75 15  Height:      Weight:   110.7 kg (244 lb 0.8 oz)   SpO2:  95% 93% 98%   CBG (last 3)  No results found for this basename: GLUCAP,  in the last 72 hours  IV Fluid Intake:   . niCARDipine Stopped (07/08/13 0930)    MEDICATIONS  .  ceFAZolin (ANCEF) IV  2 g Intravenous Q8H  . divalproex  500 mg Oral Q8H  . hydrochlorothiazide  25 mg Oral  Daily  . lisinopril  5 mg Oral Daily  . pantoprazole  40 mg Oral Daily  . potassium chloride  10 mEq Intravenous Q1 Hr x 5  . senna-docusate  1 tablet Oral BID   PRN:  acetaminophen, acetaminophen, labetalol, LORazepam, ondansetron, oxyCODONE  Diet:  General Activity: sitting up in bed DVT Prophylaxis:  SCDs CLINICALLY SIGNIFICANT STUDIES Basic Metabolic Panel:   Recent Labs Lab 07/08/13 2058 07/09/13 0258  07/10/13 0500 07/11/13 0500  NA 153* 152*  < > 146* 140  K  --  3.1*  --  2.9* 2.9*  CL  --  110  --  103 97  CO2  --  33*  --  34* 36*  GLUCOSE  --  83  --  84 97  BUN  --  18  --  22 16  CREATININE  --  0.83  --  0.89 0.86  CALCIUM  --  9.5  --  9.7 9.3  MG  --  2.3  --   --   --   PHOS  --  3.1  --   --   --   < > = values in this interval not displayed. Liver Function Tests: No results found for this basename: AST, ALT, ALKPHOS, BILITOT, PROT, ALBUMIN,  in the last 168 hours CBC:   Recent Labs Lab 07/09/13 2100 07/11/13 0500  WBC 9.5 6.1  HGB 13.8 12.9  HCT 42.1 39.3  MCV 86.8 85.1  PLT 235 201   Coagulation:   Recent Labs Lab 07/06/13 0210  LABPROT 12.5  INR 0.95   Cardiac Enzymes: No results found for this basename: CKTOTAL, CKMB, CKMBINDEX, TROPONINI,  in the last 168 hours Urinalysis:   Recent Labs Lab 07/06/13 0359  COLORURINE YELLOW  LABSPEC 1.009  PHURINE 8.0  GLUCOSEU 100*  HGBUR NEGATIVE  BILIRUBINUR NEGATIVE  KETONESUR NEGATIVE  PROTEINUR NEGATIVE  UROBILINOGEN 0.2  NITRITE NEGATIVE  LEUKOCYTESUR NEGATIVE   Lipid Panel    Component Value Date/Time   CHOL 244* 07/06/2013 0420   TRIG 153* 07/06/2013 0420   HDL 45 07/06/2013 0420   CHOLHDL 5.4 07/06/2013 0420   VLDL 31 07/06/2013 0420   LDLCALC 168* 07/06/2013 0420   HgbA1C  Lab Results  Component Value Date   HGBA1C 6.0* 07/06/2013    Urine Drug Screen:   No results found for this basename: labopia,  cocainscrnur,  labbenz,  amphetmu,  thcu,  labbarb    Alcohol Level: No results  found for this basename: ETH,  in the last 168 hours  Ct Angio Head W/cm &/or Wo Cm 07/07/2013   A right parietal and temporal lobe hemorrhage is not significantly changed in size surrounding edema is as expected. Intraventricular hemorrhage is more pronounced than on the prior exam.  The right frontal ventriculostomy catheter is now in place. There is minimal hemorrhage along the shunt site.  Subarachnoid blood over the left parietal convexity is again noted.  There is no significant hydrocephalus.  The paranasal sinuses and mastoid air cells are clear.  The osseous skull is otherwise intact.  The postcontrast images demonstrate no pathologic enhancement.  CTA images demonstrate atherosclerotic calcifications within the cavernous carotid arteries bilaterally.  Enlarged empty sella is noted.  No significant vascular stenoses are evident.  The right A1 segment is dominant.  The anterior communicating artery is patent. The MCA bifurcations are within normal limits bilaterally.  The ACA and MCA branch vessels are normal.  No significant vascular lesions are associated with the area of hemorrhage.  The vertebral arteries are codominant.  The basilar artery is within normal limits.  Prominent posterior communicating arteries are present bilaterally.  P1 segments contribute as well, more equally on the left.  There is some irregularity of distal PCA branch vessels.  The dural sinuses are patent.   Review of the MIP images confirms the above findings.  IMPRESSION:  1.  Minimal distal small vessel disease. 2.  No significant vascular lesion associated with the area of hemorrhage. 3.  Stable right parietal parenchymal hemorrhage. 4.  Status post right to the tracheostomy placement without evidence for hydrocephalus. 5.  The extent of intraventricular hemorrhage has increased.     CT Wo Contrast PORTABLE 07/09/2013 Markedly limited exam secondary to motion. There appears to be new dependent left ventricular hemorrhage.  Right ventricle hemorrhage and right parietal - posterior temporal hematoma once again noted.   Dg Chest Port 1 View 07/07/2013   1.  Interval placement of left IJ line, tip overlying the level of the superior vena cava - SVC confluence. 2.  No postprocedure pneumothorax. 3.  Cardiomegaly and vascular congestion.  07/07/2013  Increased pulmonary vascular congestion now with mild pulmonary edema.  Stable cardiomegaly.    MRI of the brain   MRA of the brain    2D Echocardiogram  EF 65%, no regional wall motion abnormality identified, no cardiac source of  emboli identified.  Carotid Doppler  Findings suggest 1-39% stenosis bilaterally. Vertebral arteries are patent with antegrade flow.  CXR 07/06/13 Shallow inspiration. Mild cardiac enlargement. No evidence of active pulmonary disease.   EKG Sinus rhythmProbable LVH with secondary repol abnrm   Therapy Recommendations CIR  Physical Exam   Obese middle aged african american lady not in distress.Awake alert. Afebrile. Head is nontraumatic. Neck is supple without bruit. Hearing is normal. Cardiac exam no murmur or gallop. Lungs are clear to auscultation. Distal pulses are well felt. She has a right frontal ventriculostomy catheter Neurological Exam :  Awake  . No gaze deviation. Pupils equal reactive. Decrease blink to threat on the left. Disoriented to time and place. Diminished attention, registration and recall. Speech is  dysarthric. Follows commands well. Mild left lower facial weakness. Motor system exam reveals no upper or lower extremity drift but patient's cooperation is limited. I suspect mild left grip and hip flexor and ankle dorsiflexor weakness. Reflexes are symmetric. Left plantar is equivocal and right is downgoing. Gait was not tested.   ASSESSMENT Alison Ward is a 52 y.o. female presenting with  Headache, confusion and vision changes secondary to large right pareital parenchymal hemorrhage with intraventricular extension mild  cytotoxic edema and hydrocephalous-etiology likely hypertensive with accelerated hypertension. Patient initially had to undergo placement of right frontal ventriculostomy via a burr hole but continued to have decreased mental status and on 07/07/2013 had to undergo placement of left frontal ventriculostomy via a burr hole and removal of right ventriculostomy. More awake today, talking and answering questions appropriately. Reconizing family.  Patient able to tolerate CT 07/09/2013 There appears to be new dependent left ventricular hemorrhage, in addition to previously known hemorrhagic areas.    Hyperlipidemia, add statin when able to take po/per tube, LDL 168 goal < 100 for non diabetics. Ordered LFTs before starting statin.  Cytotoxic edema, 3% saline-discontinued. 149 Na+  Accelerated Hypertension  Right ICH, Intraventricular hemorrhage with hydrocephalus  Long term medication use  Hypokalemia, trending upward.   Hospital day # 5  TREATMENT/PLAN  Continue strict control of hypertension with blood pressure goal below 160. HCTZ home med started. Cardene weaned.  Change meds to po as tolerated.  CIR: plans for keeping ventriculostomy in place into next week  Speech: regular, thin diet.  Add statin, check liver function before starting. Ordered.  Dr. Pearlean Brownie discussed the patients plan of care with family, CCM.   This patient is critically ill and at significant risk of neurological worsening, death and care requires constant monitoring of vital signs, hemodynamics,respiratory and cardiac monitoring,review of multiple databases, neurological assessment, discussion with family, other specialists and medical decision making of high complexity. I spent 31 minutes of neurocritical care time  in the care of  this patient.  Gwendolyn Lima. Manson Passey, [facility], MBA, MHA [facility] Pager: [phone] 07/11/2013 7:58 AM  I have personally obtained a history, examined the patient, evaluated  imaging results, and formulated the assessment and plan of care. I agree with the above.  Delia Heady, MD

## 2013-07-11 NOTE — Consult Note (Signed)
Physical Medicine and Rehabilitation Consult Reason for Consult: Right parietal parenchymal hemorrhage Referring Physician: Dr. Tyson Alias   HPI: Alison Ward is a 52 y.o. right-handed African American female with history of hypertension and poor medical compliance. Admitted 07/06/2013 with headache as well as lethargy. Blood pressure 202/98 point admission through the emergency room. 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 Underwent placement of right frontal ventriculostomy via burr hole 07/06/2013 per Dr. Lovell Sheehan. Carotid Dopplers with no ICA stenosis. Echocardiogram with ejection fraction of 70% and grade 1 diastolic dysfunction. Patient did not receive TPA secondary to ICH. Placed on Cardene drip for blood pressure control. Followup neurosurgery with findings of occluded ventriculostomy vessel underwent placement of left ventriculostomy and removal of right ventriculostomy 07/07/2013. Maintained on valproate for seizure prophylaxis. Patient is presently maintained on a regular diet. Physical and occupational therapy evaluations completed with recommendations for physical medicine rehabilitation consult to consider inpatient rehabilitation services.   Review of Systems  Neurological: Positive for dizziness and headaches.  All other systems reviewed and are negative.   Past Medical History  Diagnosis Date  . Hypertension    Past Surgical History  Procedure Laterality Date  . Ventriculostomy Left 07/07/2013    Procedure: VENTRICULOSTOMY;  Surgeon: Cristi Loron, MD;  Location: [facility];  Service: Neurosurgery;  Laterality: Left;  Left Ventriculostomy and Removal of Right Vetriculostomy   Family History  Problem Relation Age of Onset  . Hypertension Mother   . Hypertension Father   . Diabetes Daughter    Social  History:  reports that she has never smoked. She has never used smokeless tobacco. She reports that she does not drink alcohol or use illicit drugs. Allergies: No Known Allergies Medications Prior to Admission  Medication Sig Dispense Refill  . HYDROCHLOROTHIAZIDE PO Take 1 tablet by mouth daily.      Marland Kitchen ibuprofen (ADVIL,MOTRIN) 200 MG tablet Take 200 mg by mouth every 6 (six) hours as needed for pain.      Marland Kitchen lisinopril (PRINIVIL,ZESTRIL) 20 MG tablet Take 20 mg by mouth daily.        Home: Home Living Family/patient expects to be discharged to:: Private residence Living Arrangements: Alone Available Help at Discharge: Family;Available 24 hours/day Type of Home: Apartment Home Access: Stairs to enter Entrance Stairs-Number of Steps: 3 down with right rail and then 6 with both rails Entrance Stairs-Rails:  (see above) Home Layout: One level Home Equipment: None Additional Comments: administrative job at work  Functional History: Prior Function Comments: tub with curtain and standard toilet Functional Status:  Mobility: Bed Mobility Bed Mobility: Rolling Left;Left Sidelying to Sit;Sitting - Scoot to [place] of Bed Rolling Left: 3: Mod assist Rolling Left: Patient Percentage: 50% Left Sidelying to Sit: 1: +2 Total assist;HOB elevated Left Sidelying to Sit: Patient Percentage: 50% Supine to Sit: Not tested (comment) Supine to Sit: Patient Percentage: 10% Sitting - Scoot to Edge of Bed: 3: Mod assist Sitting - Scoot to Edge of Bed: Patient Percentage: 10% Sit to Supine: Not Tested (comment) Sit to Supine: Patient Percentage: 0% Scooting to HOB: Not tested (comment) Scooting to [facility]: Patient Percentage: 0% Transfers Transfers: Sit to Stand;Stand to Sit;Stand Pivot Transfers Sit to Stand: 1: +2 Total assist;With upper extremity assist;From bed Sit to Stand: Patient Percentage: 60% Stand to Sit: 1: +2 Total assist;With upper extremity assist;With  armrests;To chair/3-in-1 Stand to Sit:  Patient Percentage: 60% Stand Pivot Transfers: 1: +2 Total assist Stand Pivot Transfers: Patient Percentage: 60% Lateral/Scoot Transfers: Not tested (comment) Lateral Transfers: Patient Percentage: 50% Ambulation/Gait Ambulation/Gait Assistance: Not tested (comment) Stairs: No Wheelchair Mobility Wheelchair Mobility: No  ADL: ADL Eating/Feeding: NPO Where Assessed - Eating/Feeding: Edge of bed Grooming: Moderate assistance Where Assessed - Grooming: Unsupported sitting Upper Body Bathing: Maximal assistance Where Assessed - Upper Body Bathing: Supported sitting Lower Body Bathing: +1 Total assistance Where Assessed - Lower Body Bathing: Supported sit to stand Upper Body Dressing: +1 Total assistance Where Assessed - Upper Body Dressing: Unsupported sitting Lower Body Dressing: +1 Total assistance Where Assessed - Lower Body Dressing: Supported sit to Pharmacist, hospital: +2 Total assistance Transfers/Ambulation Related to ADLs: total A +2 (pt=30%) partial sit<>stand  Cognition: Cognition Overall Cognitive Status: Impaired/Different from baseline Arousal/Alertness: Awake/alert Orientation Level: Oriented X4 Attention: Sustained Sustained Attention: Impaired Sustained Attention Impairment: Verbal basic;Functional basic Awareness: Impaired Awareness Impairment: Intellectual impairment Problem Solving: Impaired Problem Solving Impairment: Verbal basic;Functional basic Cognition Arousal/Alertness: Awake/alert Behavior During Therapy: Flat affect Overall Cognitive Status: Impaired/Different from baseline Area of Impairment: Attention;Memory;Following commands;Safety/judgement;Awareness;Problem solving Orientation Level: Place;Time;Situation Current Attention Level: Focused Memory: Decreased recall of precautions;Decreased short-term memory Following Commands: Follows one step commands inconsistently Safety/Judgement: Decreased awareness of safety;Decreased awareness of  deficits Awareness: Intellectual Problem Solving: Slow processing;Decreased initiation;Difficulty sequencing;Requires verbal cues;Requires tactile cues General Comments: Pt initially talking and joking on arrival.  After sitting pt up on EOB, pt more lethargic with decr following commands.   Difficult to assess due to: Level of arousal  Blood pressure 152/91, pulse 86, temperature 98.9 F (37.2 C), temperature source Oral, resp. rate 15, height 5\' 6"  (1.676 m), weight 110.7 kg (244 lb 0.8 oz), last menstrual period 11/24/2011, SpO2 98.00%. Physical Exam  Vitals reviewed. Constitutional: She is oriented to person, place, and time. She appears well-developed.  Eyes: EOM are normal.  Neck: Normal range of motion. Neck supple. No thyromegaly present.  Cardiovascular: Normal rate and regular rhythm.   Pulmonary/Chest: Effort normal and breath sounds normal. No respiratory distress.  Abdominal: Soft. Bowel sounds are normal. She exhibits no distension.  Neurological: She is alert and oriented to person, place, and time.  Follows simple commands. Delayed processing. Oriented to place, name. Reviewed basic biographical information. Left inattention. Left UE grossly 2-3/5. LLE is 2-3 also. Intact touch to pain and general touch. Left central 7.   Skin:  Ventriculostomy drain in place  Psychiatric: She has a normal mood and affect. Her behavior is normal.    Results for orders placed during the hospital encounter of 07/06/13 (from the past 24 hour(s))  CBC     Status: Abnormal   Collection Time    07/11/13  5:00 AM      Result Value Range   WBC 6.1  4.0 - 10.5 K/uL   RBC 4.62  3.87 - 5.11 MIL/uL   Hemoglobin 12.9  12.0 - 15.0 g/dL   HCT 09.8  11.9 - 14.7 %   MCV 85.1  78.0 - 100.0 fL   MCH 27.9  26.0 - 34.0 pg   MCHC 32.8  30.0 - 36.0 g/dL   RDW 82.9 (*) 56.2 - 13.0 %   Platelets 201  150 - 400 K/uL  BASIC METABOLIC PANEL     Status: Abnormal   Collection Time    07/11/13  5:00 AM       Result Value Range   Sodium  140  135 - 145 mEq/L   Potassium 2.9 (*) 3.5 - 5.1 mEq/L   Chloride 97  96 - 112 mEq/L   CO2 36 (*) 19 - 32 mEq/L   Glucose, Bld 97  70 - 99 mg/dL   BUN 16  6 - 23 mg/dL   Creatinine, Ser 1.47  0.50 - 1.10 mg/dL   Calcium 9.3  8.4 - 82.9 mg/dL   GFR calc non Af Amer 75 (*) >90 mL/min   GFR calc Af Amer 87 (*) >90 mL/min   Ct Portable Head W/o Cm  07/09/2013   *RADIOLOGY REPORT*  Clinical Data: Followup intracranial hemorrhage.  CT HEAD WITHOUT CONTRAST  Technique:  Contiguous axial images were obtained from the base of the skull through the vertex without contrast.  Comparison: 07/07/2013.  Findings: Exam was performed portably.  The patient sat up during examination and there is significant motion degradation.  Further imaging was cancelled by Dr. Pearlean Brownie.  Exam is limited with respect to evaluating for any significant change given the degree of motion.  Right parietal - posterior temporal lobe hemorrhage is noted. Blood is seen within the ventricles with shunt catheter in place. When compared to the prior examination, now seen is blood within the left lateral ventricle and not noted on the prior exam.  Limited for evaluating for progressive parenchymal hemorrhage, infarct, evaluating for progressive hydrocephalus or detecting underlying mass.  IMPRESSION: Markedly limited exam secondary to motion.  There appears to be new dependent left ventricular hemorrhage.  Right ventricle hemorrhage and right parietal - posterior temporal hematoma once again noted.   Original Report Authenticated By: Lacy Duverney, M.D.    Assessment/Plan: Diagnosis: right temporal-parietal ICH 1. Does the need for close, 24 hr/day medical supervision in concert with the patient's rehab needs make it unreasonable for this patient to be served in a less intensive setting? Yes 2. Co-Morbidities requiring supervision/potential complications: htn 3. Due to bladder management, bowel management, safety,  skin/wound care, disease management, medication administration, pain management and patient education, does the patient require 24 hr/day rehab nursing? Yes 4. Does the patient require coordinated care of a physician, rehab nurse, PT (1-2 hrs/day, 5 days/week), OT (1-2 hrs/day, 5 days/week) and SLP (1-2 hrs/day, 5 days/week) to address physical and functional deficits in the context of the above medical diagnosis(es)? Yes Addressing deficits in the following areas: balance, endurance, locomotion, strength, transferring, bowel/bladder control, bathing, dressing, feeding, grooming, toileting, cognition, speech, language, swallowing and psychosocial support 5. Can the patient actively participate in an intensive therapy program of at least 3 hrs of therapy per day at least 5 days per week? Yes 6. The potential for patient to make measurable gains while on inpatient rehab is excellent 7. Anticipated functional outcomes upon discharge from inpatient rehab are mod I to supervision with PT, mod I to supervision with OT, modI with SLP. 8. Estimated rehab length of stay to reach the above functional goals is: 2 weeks 9. Does the patient have adequate social supports to accommodate these discharge functional goals? Yes and Potentially 10. Anticipated D/C setting: Home 11. Anticipated post D/C treatments: HH therapy 12. Overall Rehab/Functional Prognosis: excellent  RECOMMENDATIONS: This patient's condition is appropriate for continued rehabilitative care in the following setting: CIR Patient has agreed to participate in recommended program. Yes and Potentially Note that insurance prior authorization may be required for reimbursement for recommended care.  Comment: Rehab RN to follow up.   Ranelle Oyster, MD, [place] [facility]  07/11/2013  

## 2013-07-11 NOTE — Progress Notes (Signed)
Speech Language Pathology Treatment Patient Details Name: Alison Ward MRN: [id number] DOB: 1961-09-26 Today's Date: 07/11/2013 Time: 0930-1000 SLP Time Calculation (min): 30 min  Assessment / Plan / Recommendation Clinical Impression  Pt seen for treatment of mild oral dysphagia, and cognition. Pt with mild oral residuals observed in right buccal cavity, suspect not due to weakness, but poor attention to oral prepartion. SLP provided min verbal cues to pt to complete mastication and swallow prior to second bite, improving function. Pt required moderate verbal and tactile cues to sustain attention to basic functional task for 60 seconds. She also required max tactile and verbal cues to locate objects in mid left visual field during self feeding. SLP will continue efforts, continue to recommend CIR.     SLP Plan  Continue with current plan of care    Pertinent Vitals/Pain NA  SLP Goals  SLP Goals Potential to Achieve Goals: Good Potential Considerations: Ability to learn/carryover information Progress/Goals/Alternative treatment plan discussed with pt/caregiver and they: Patient unable to parrticipate in goal setting SLP Goal #1: Pt will sustain attention for 60 seconds during familiar functional task. SLP Goal #1 - Progress: Progressing toward goal SLP Goal #2: Pt will utilize external aids to answer orientation questions with Max cues. SLP Goal #2 - Progress: Progressing toward goal SLP Goal #3: Pt will demonstrate intellectual awareness of 1 physical and 1 cognitive deficit with Max cues. SLP Goal #3 - Progress: Progressing toward goal SLP Goal #4: Pt will follow two-step commands with Max cues from SLP. SLP Goal #4 - Progress: Progressing toward goal  General Temperature Spikes Noted: No Respiratory Status: Room air Behavior/Cognition: Cooperative;Confused;Lethargic;Requires cueing Oral Cavity - Dentition: Adequate natural dentition Patient Positioning: Upright in bed  Oral  Cavity - Oral Hygiene Brush patient's teeth BID with toothbrush (using toothpaste with fluoride): Yes   Treatment Treatment focused on: Cognition (dysphagia)   GO    [name], MA CCC-SLP [phone]  [name] 07/11/2013, 10:26 AM

## 2013-07-11 NOTE — Progress Notes (Signed)
Patient ID: Alison Ward, female   DOB: Nov 30, 1960, 52 y.o.   MRN: [id number] Subjective:  The patient is alert and pleasant. She looks well. I spoke with her sister.  Objective: Vital signs in last 24 hours: Temp:  [98 F (36.7 C)-99.2 F (37.3 C)] 98.9 F (37.2 C) (09/12 0358) Pulse Rate:  [66-89] 86 (09/12 0600) Resp:  [11-75] 15 (09/12 0600) BP: (132-180)/(79-110) 160/103 mmHg (09/12 0600) SpO2:  [91 %-99 %] 98 % (09/12 0600) Weight:  [110.7 kg (244 lb 0.8 oz)] 110.7 kg (244 lb 0.8 oz) (09/12 0500)  Intake/Output from previous day: 09/11 0701 - 09/12 0700 In: 520 [P.O.:120; IV Piggyback:400] Out: 349 [Drains:349] Intake/Output this shift:    Physical exam the patient is alert and oriented x3. Her strength is normal. Her speech is normal. Her pupils are equal.  The patient's ventriculostomy is patent and draining bloody spinal fluid.  Lab Results:  Recent Labs  07/09/13 2100 07/11/13 0500  WBC 9.5 6.1  HGB 13.8 12.9  HCT 42.1 39.3  PLT 235 201   BMET  Recent Labs  07/10/13 0500 07/11/13 0500  NA 146* 140  K 2.9* 2.9*  CL 103 97  CO2 34* 36*  GLUCOSE 84 97  BUN 22 16  CREATININE 0.89 0.86  CALCIUM 9.7 9.3    Studies/Results: Ct Portable Head W/o Cm  07/09/2013   *RADIOLOGY REPORT*  Clinical Data: Followup intracranial hemorrhage.  CT HEAD WITHOUT CONTRAST  Technique:  Contiguous axial images were obtained from the base of the skull through the vertex without contrast.  Comparison: 07/07/2013.  Findings: Exam was performed portably.  The patient sat up during examination and there is significant motion degradation.  Further imaging was cancelled by Dr. Pearlean Brownie.  Exam is limited with respect to evaluating for any significant change given the degree of motion.  Right parietal - posterior temporal lobe hemorrhage is noted. Blood is seen within the ventricles with shunt catheter in place. When compared to the prior examination, now seen is blood within the left  lateral ventricle and not noted on the prior exam.  Limited for evaluating for progressive parenchymal hemorrhage, infarct, evaluating for progressive hydrocephalus or detecting underlying mass.  IMPRESSION: Markedly limited exam secondary to motion.  There appears to be new dependent left ventricular hemorrhage.  Right ventricle hemorrhage and right parietal - posterior temporal hematoma once again noted.   Original Report Authenticated By: Lacy Duverney, M.D.    Assessment/Plan: Right intracerebral hemorrhage, intraventricular hemorrhage, hydrocephalus: The patient is doing well with a ventriculostomy. We'll plan to continue his throughout the weekend. A likely attempt to elevate it sometime next week.  LOS: 5 days     Zayana Salvador D 07/11/2013, 7:42 AM

## 2013-07-11 NOTE — Progress Notes (Signed)
eLink Physician-Brief Progress Note Patient Name: MIRTIE BASTYR DOB: 23-Mar-1961 MRN: [id number]  Date of Service  07/11/2013   HPI/Events of Note   hypokalemia  eICU Interventions  repleted   Intervention Category Intermediate Interventions: Electrolyte abnormality - evaluation and management  GIDDINGS, OLIVIA K. 07/11/2013, 9:18 PM

## 2013-07-12 DIAGNOSIS — G936 Cerebral edema: Secondary | ICD-10-CM

## 2013-07-12 DIAGNOSIS — G911 Obstructive hydrocephalus: Secondary | ICD-10-CM

## 2013-07-12 DIAGNOSIS — I619 Nontraumatic intracerebral hemorrhage, unspecified: Secondary | ICD-10-CM

## 2013-07-12 DIAGNOSIS — I10 Essential (primary) hypertension: Secondary | ICD-10-CM

## 2013-07-12 LAB — CBC
HCT: 39.7 % (ref 36.0–46.0)
MCV: 85.9 fL (ref 78.0–100.0)
RBC: 4.62 MIL/uL (ref 3.87–5.11)
WBC: 6.5 10*3/uL (ref 4.0–10.5)

## 2013-07-12 LAB — BASIC METABOLIC PANEL
BUN: 14 mg/dL (ref 6–23)
Chloride: 94 mEq/L — ABNORMAL LOW (ref 96–112)
Creatinine, Ser: 0.97 mg/dL (ref 0.50–1.10)
GFR calc non Af Amer: 65 mL/min — ABNORMAL LOW (ref >90)
Glucose, Bld: 102 mg/dL — ABNORMAL HIGH (ref 70–99)
Potassium: 3.6 mEq/L (ref 3.5–5.1)

## 2013-07-12 MED ORDER — OXYCODONE HCL 5 MG PO TABS
5.0000 mg | ORAL_TABLET | ORAL | Status: DC | PRN
  Administered 2013-07-12 – 2013-07-16 (×7): 5 mg via ORAL
  Filled 2013-07-12 (×7): qty 1

## 2013-07-12 NOTE — Progress Notes (Signed)
Stroke Team Progress Note  HISTORY Alison Ward is an 52 y.o. female with a history of hypertension and poor compliance with taking medication, presenting with confusion and headache as well as lethargy. Patient was confused and called family members to get directions home. She was noted to be outside of the home but unknown resident. A pedestrian called EMS. Emergency services subsequently located family members. Blood pressure on arrival in the emergency room was 202/98. CT scan of her head showed a 4.1 x 3.1 cm parietotemporal intracerebral infarction with surrounding edema and intraventricular extension of hemorrhage. Patient had a 6 mm right to left midline shift. There is no previous history of stroke nor TIA. Patient has not been on antiplatelet therapy. NIH stroke score was 20.  LSN: Unclear  tPA Given: No: ICH  MRankin: 4  Patient was not a TPA candidate secondary to ICH  She was admitted to the neuro ICU for further evaluation and treatment.  Patient initially had to undergo placement of right frontal ventriculostomy via a burr hole but continued to have decreased mental status and on 07/07/2013 had to undergo placement of left frontal ventriculostomy via a burr hole and removal of right ventriculostomy.   SUBJECTIVE Family at bedside. Patient awake and alert. Says she feels better. Still draining. ventric to remain in place.     OBJECTIVE Most recent Vital Signs: Filed Vitals:   07/12/13 0600 07/12/13 0700 07/12/13 0748 07/12/13 0800  BP: 152/96 146/102 147/103 143/86  Pulse: 68 77 70 73  Temp:   98.1 F (36.7 C)   TempSrc:   Axillary   Resp: 17 14 14 16   Height:      Weight:      SpO2: 96% 98% 92% 92%   CBG (last 3)  No results found for this basename: GLUCAP,  in the last 72 hours  IV Fluid Intake:   . niCARDipine Stopped (07/08/13 0930)    MEDICATIONS  . divalproex  500 mg Oral Q8H  . hydrochlorothiazide  25 mg Oral Daily  . lisinopril  5 mg Oral Daily  .  pantoprazole  40 mg Oral Daily  . senna-docusate  1 tablet Oral BID   PRN:  acetaminophen, acetaminophen, labetalol, LORazepam, ondansetron, oxyCODONE  Diet:  General Activity: sitting up in bed DVT Prophylaxis:  SCDs CLINICALLY SIGNIFICANT STUDIES Basic Metabolic Panel:   Recent Labs Lab 07/09/13 0258  07/11/13 0914 07/12/13 0500  NA 152*  < > 136 137  K 3.1*  < > 2.7* 3.6  CL 110  < > 92* 94*  CO2 33*  < > 33* 36*  GLUCOSE 83  < > 136* 102*  BUN 18  < > 13 14  CREATININE 0.83  < > 0.80 0.97  CALCIUM 9.5  < > 9.4 9.5  MG 2.3  --   --  2.1  PHOS 3.1  --   --  3.8  < > = values in this interval not displayed. Liver Function Tests:   Recent Labs Lab 07/11/13 0500  AST 16  ALT 7  ALKPHOS 52  BILITOT 0.5  PROT 7.1  ALBUMIN 3.0*   CBC:   Recent Labs Lab 07/11/13 0500 07/12/13 0500  WBC 6.1 6.5  HGB 12.9 12.8  HCT 39.3 39.7  MCV 85.1 85.9  PLT 201 190   Coagulation:   Recent Labs Lab 07/06/13 0210  LABPROT 12.5  INR 0.95   Cardiac Enzymes: No results found for this basename: CKTOTAL, CKMB, CKMBINDEX, TROPONINI,  in the last 168 hours Urinalysis:   Recent Labs Lab 07/06/13 0359  COLORURINE YELLOW  LABSPEC 1.009  PHURINE 8.0  GLUCOSEU 100*  HGBUR NEGATIVE  BILIRUBINUR NEGATIVE  KETONESUR NEGATIVE  PROTEINUR NEGATIVE  UROBILINOGEN 0.2  NITRITE NEGATIVE  LEUKOCYTESUR NEGATIVE   Lipid Panel    Component Value Date/Time   CHOL 244* 07/06/2013 0420   TRIG 153* 07/06/2013 0420   HDL 45 07/06/2013 0420   CHOLHDL 5.4 07/06/2013 0420   VLDL 31 07/06/2013 0420   LDLCALC 168* 07/06/2013 0420   HgbA1C  Lab Results  Component Value Date   HGBA1C 6.0* 07/06/2013    Urine Drug Screen:   No results found for this basename: labopia,  cocainscrnur,  labbenz,  amphetmu,  thcu,  labbarb    Alcohol Level: No results found for this basename: ETH,  in the last 168 hours  Ct Angio Head W/cm &/or Wo Cm 07/07/2013   A right parietal and temporal lobe hemorrhage is not  significantly changed in size surrounding edema is as expected. Intraventricular hemorrhage is more pronounced than on the prior exam.  The right frontal ventriculostomy catheter is now in place. There is minimal hemorrhage along the shunt site.  Subarachnoid blood over the left parietal convexity is again noted.  There is no significant hydrocephalus.  The paranasal sinuses and mastoid air cells are clear.  The osseous skull is otherwise intact.  The postcontrast images demonstrate no pathologic enhancement.  CTA images demonstrate atherosclerotic calcifications within the cavernous carotid arteries bilaterally.  Enlarged empty sella is noted.  No significant vascular stenoses are evident.  The right A1 segment is dominant.  The anterior communicating artery is patent. The MCA bifurcations are within normal limits bilaterally.  The ACA and MCA branch vessels are normal.  No significant vascular lesions are associated with the area of hemorrhage.  The vertebral arteries are codominant.  The basilar artery is within normal limits.  Prominent posterior communicating arteries are present bilaterally.  P1 segments contribute as well, more equally on the left.  There is some irregularity of distal PCA branch vessels.  The dural sinuses are patent.   Review of the MIP images confirms the above findings.  IMPRESSION:  1.  Minimal distal small vessel disease. 2.  No significant vascular lesion associated with the area of hemorrhage. 3.  Stable right parietal parenchymal hemorrhage. 4.  Status post right to the tracheostomy placement without evidence for hydrocephalus. 5.  The extent of intraventricular hemorrhage has increased.     CT Wo Contrast PORTABLE 07/09/2013 Markedly limited exam secondary to motion. There appears to be new dependent left ventricular hemorrhage. Right ventricle hemorrhage and right parietal - posterior temporal hematoma once again noted.   Dg Chest Port 1 View 07/07/2013   1.  Interval placement  of left IJ line, tip overlying the level of the superior vena cava - SVC confluence. 2.  No postprocedure pneumothorax. 3.  Cardiomegaly and vascular congestion.  07/07/2013  Increased pulmonary vascular congestion now with mild pulmonary edema.  Stable cardiomegaly.    MRI of the brain   MRA of the brain    2D Echocardiogram  EF 65%, no regional wall motion abnormality identified, no cardiac source of emboli identified.  Carotid Doppler  Findings suggest 1-39% stenosis bilaterally. Vertebral arteries are patent with antegrade flow.  CXR 07/06/13 Shallow inspiration. Mild cardiac enlargement. No evidence of active pulmonary disease.   EKG Sinus rhythmProbable LVH with secondary repol abnrm   Therapy Recommendations  CIR  Physical Exam   Obese middle aged african american lady not in distress.Awake alert. Afebrile. Head is nontraumatic. Neck is supple without bruit. Hearing is normal. Cardiac exam no murmur or gallop. Lungs are clear to auscultation. Distal pulses are well felt. She has a right frontal ventriculostomy catheter Neurological Exam :  Awake  . No gaze deviation. Pupils equal reactive. Decrease blink to threat on the left. Disoriented to time and place. Diminished attention, registration and recall. Speech is  dysarthric. Follows commands well. Mild left lower facial weakness. Motor system exam reveals no upper or lower extremity drift but patient's cooperation is limited. I suspect mild left grip and hip flexor and ankle dorsiflexor weakness. Reflexes are symmetric. Left plantar is equivocal and right is downgoing. Gait was not tested.   ASSESSMENT [name] is a 52 y.o. female presenting with  Headache, confusion and vision changes secondary to large right pareital parenchymal hemorrhage with intraventricular extension mild cytotoxic edema and hydrocephalous-etiology likely hypertensive with accelerated hypertension. Patient initially had to undergo placement of right  frontal ventriculostomy via a burr hole but continued to have decreased mental status and on 07/07/2013 had to undergo placement of left frontal ventriculostomy via a burr hole and removal of right ventriculostomy.   Patient able to tolerate CT 07/09/2013 There appears to be new dependent left ventricular hemorrhage, in addition to previously known hemorrhagic areas.    Hyperlipidemia, add statin when able to take po/per tube, LDL 168 goal < 100 for non diabetics. Ordered LFTs before starting statin.  Cytotoxic edema, 3% saline-discontinued. 149 Na+  Accelerated Hypertension  Right ICH, Intraventricular hemorrhage with hydrocephalus  Long term medication use  Hypokalemia, resolved  Hospital day # 6  TREATMENT/PLAN  Continue strict control of hypertension with blood pressure goal below 160. HCTZ home med started.   CIR: plans for keeping ventriculostomy in place into next week. Once out, can transfer out of unit.  Speech: regular, thin diet.  Plan of care discussed with patient and family  This patient is critically ill and at significant risk of neurological worsening, death and care requires constant monitoring of vital signs, hemodynamics,respiratory and cardiac monitoring,review of multiple databases, neurological assessment, discussion with family, other specialists and medical decision making of high complexity. I spent 31 minutes of neurocritical care time  in the care of  this patient.  Gwendolyn Lima. Manson Passey, [facility], MBA, MHA [facility] Pager: [phone] 07/12/2013 9:02 AM  I have personally obtained a history, examined the patient, evaluated imaging results, and formulated the assessment and plan of care. I agree with the above.  Patient seen and examined together with physician assistant and I concur with the assessment and plan.  Wyatt Portela, MD

## 2013-07-12 NOTE — Progress Notes (Signed)
Subjective: Patient reports doing well.  Objective: Vital signs in last 24 hours: Temp:  [98 F (36.7 C)-98.4 F (36.9 C)] 98.1 F (36.7 C) (09/13 0748) Pulse Rate:  [65-86] 75 (09/13 0900) Resp:  [0-21] 18 (09/13 0900) BP: (128-168)/(79-104) 150/103 mmHg (09/13 0900) SpO2:  [91 %-100 %] 97 % (09/13 0900) Weight:  [112.2 kg (247 lb 5.7 oz)] 112.2 kg (247 lb 5.7 oz) (09/13 0500)  Intake/Output from previous day: 09/12 0701 - 09/13 0700 In: 550 [P.O.:100; IV Piggyback:450] Out: 246 [Drains:246] Intake/Output this shift: Total I/O In: -  Out: 30 [Drains:30]  Physical Exam: MAEW with good power.  Face symmetric, speech fluent, oriented.  IVC draining well (~15cc/hr bloody CSF)  Lab Results:  Recent Labs  07/11/13 0500 07/12/13 0500  WBC 6.1 6.5  HGB 12.9 12.8  HCT 39.3 39.7  PLT 201 190   BMET  Recent Labs  07/11/13 0914 07/12/13 0500  NA 136 137  K 2.7* 3.6  CL 92* 94*  CO2 33* 36*  GLUCOSE 136* 102*  BUN 13 14  CREATININE 0.80 0.97  CALCIUM 9.4 9.5    Studies/Results: No results found.  Assessment/Plan: Continue IVC at present height through weekend, then begin to raise, per Dr. Lovell Sheehan' plan.    LOS: 6 days    Dorian Heckle, MD 07/12/2013, 9:18 AM

## 2013-07-12 NOTE — Progress Notes (Signed)
PULMONARY  / CRITICAL CARE MEDICINE  Name: Alison Ward MRN: [id number] DOB: [birth year]    ADMISSION DATE:  07/06/2013 CONSULTATION DATE:  07/06/2013  REFERRING MD :  Dr. Lavella Lemons PRIMARY SERVICE: CCM  CHIEF COMPLAINT:  AMS  BRIEF PATIENT DESCRIPTION: 1 F with ICH a r/t HTN and elevated ICP.  SIGNIFICANT EVENTS / STUDIES: ICH 07/06/2013 s/p ventric ventric removed and replaced 9/8 Lovell Sheehan) 3% saline started 9/8, stopped 9/10  CULTURES: None  ANTIBIOTICS: Cefazolin 9/7 >> 9/12   SUBJECTIVE:  C/o mild frontal headache.  More alert.   ventric still in place afebrile  VITAL SIGNS: Temp:  [98 F (36.7 C)-98.4 F (36.9 C)] 98.1 F (36.7 C) (09/13 0748) Pulse Rate:  [65-86] 74 (09/13 1000) Resp:  [7-21] 19 (09/13 1000) BP: (128-165)/(79-104) 138/101 mmHg (09/13 1000) SpO2:  [91 %-100 %] 98 % (09/13 1000) Weight:  [247 lb 5.7 oz (112.2 kg)] 247 lb 5.7 oz (112.2 kg) (09/13 0500)  HEMODYNAMICS:    VENTILATOR SETTINGS:    INTAKE / OUTPUT: Intake/Output     09/12 0701 - 09/13 0700 09/13 0701 - 09/14 0700   P.O. 100    IV Piggyback 450    Total Intake(mL/kg) 550 (4.9)    Drains 246 47   Total Output 246 47   Net +304 -47        Urine Occurrence 6 x 2 x     PHYSICAL EXAMINATION: General:  Obese F in NAD Neuro:  Awake and interacting, more alert, MAE HEENT:  Sclera anicteric, pupil equal and reactive bilaterally, MMM, OP clear, L ventric  Neck:  Trachea supple and midline, (-) JVD, (-) LAN Cardiovascular:  RRR, NS1/S2, (-) MRG Lungs:  resps even non labored on [place], CTAB Abdomen:  S/NT/ND/(+)BS Musculoskeletal:  (-) C/C/E, OA toes biltaerally Skin:  Intact  LABS:  CBC Recent Labs     07/09/13  2100  07/11/13  0500  07/12/13  0500  WBC  9.5  6.1  6.5  HGB  13.8  12.9  12.8  HCT  42.1  39.3  39.7  PLT  235  201  190    Coag's No results found for this basename: APTT, INR,  in the last 72 hours  BMET Recent Labs     07/11/13  0500  07/11/13  0914   07/12/13  0500  NA  140  136  137  K  2.9*  2.7*  3.6  CL  97  92*  94*  CO2  36*  33*  36*  BUN  16  13  14   CREATININE  0.86  0.80  0.97  GLUCOSE  97  136*  102*    Electrolytes Recent Labs     07/11/13  0500  07/11/13  0914  07/12/13  0500  CALCIUM  9.3  9.4  9.5  MG   --    --   2.1  PHOS   --    --   3.8    Sepsis Markers No results found for this basename: LACTICACIDVEN, PROCALCITON, O2SATVEN,  in the last 72 hours  ABG No results found for this basename: PHART, PCO2ART, PO2ART,  in the last 72 hours  Liver Enzymes Recent Labs     07/11/13  0500  AST  16  ALT  7  ALKPHOS  52  BILITOT  0.5  ALBUMIN  3.0*    Cardiac Enzymes No results found for this basename: TROPONINI, PROBNP,  in the last 72  hours  Glucose No results found for this basename: GLUCAP,  in the last 72 hours  Imaging No results found.   CXR:  No new CXR 9/13  ASSESSMENT / PLAN: Principal Problem:   Intracerebral hemorrhage Active Problems:   HYPERTENSION   PULMONARY A:  No active issues P:   she is protecting airway adequately currently and mental status improved  CARDIOVASCULAR A:  HTN: Intrinsic + reactive. Goal SBP <= 140 P:  Nicardipine Drip off 9/8 Cont hctz, lisinopril   RENAL A:  Hypokalemia  P:   Replaced K F/u BMP  GASTROINTESTINAL A: Tol PO's   HEMATOLOGIC A:   No Acute Issues F/u cbc   INFECTIOUS A: Cefazolin 9/7 > 9/12 for ventric prophylaxis  ENDOCRINE A:   No Acute Issues  NEUROLOGIC A:  Intracerebral Hemorrhage Intraventricular blood and enlargement s/p ventriculostomy 9/7 and replaced 9/8  P:   3% saline protocol stopped 9/10 Cont Valproate  Serial Neuro Exams improving Out of ICU when ventric able to be removed - still draining good amount of bloody CSF F/u imaging per neurology's plans PRN pain rx for headache   Discussed status and plans with pt and daughter at bedside 9/13.    *Care during the described time interval  was provided by me and/or other providers on the critical care team. I have reviewed this patient's available data, including medical history, events of note, physical examination and test results as part of my evaluation.  Cyril Mourning MD. Tonny Bollman. [facility] Pager [phone] If no response call [phone]

## 2013-07-13 DIAGNOSIS — I619 Nontraumatic intracerebral hemorrhage, unspecified: Secondary | ICD-10-CM

## 2013-07-13 DIAGNOSIS — I10 Essential (primary) hypertension: Secondary | ICD-10-CM

## 2013-07-13 DIAGNOSIS — G911 Obstructive hydrocephalus: Secondary | ICD-10-CM

## 2013-07-13 DIAGNOSIS — Z79899 Other long term (current) drug therapy: Secondary | ICD-10-CM

## 2013-07-13 MED ORDER — CALCIUM CARBONATE ANTACID 500 MG PO CHEW
200.0000 mg | CHEWABLE_TABLET | Freq: Two times a day (BID) | ORAL | Status: DC
  Administered 2013-07-13 – 2013-07-21 (×17): 200 mg via ORAL
  Filled 2013-07-13 (×20): qty 1

## 2013-07-13 MED ORDER — ATORVASTATIN CALCIUM 40 MG PO TABS
40.0000 mg | ORAL_TABLET | Freq: Every day | ORAL | Status: DC
  Administered 2013-07-13 – 2013-07-21 (×9): 40 mg via ORAL
  Filled 2013-07-13 (×9): qty 1

## 2013-07-13 NOTE — Progress Notes (Signed)
PULMONARY  / CRITICAL CARE MEDICINE  Name: Alison Ward MRN: [id number] DOB: 04/09/1961    ADMISSION DATE:  07/06/2013 CONSULTATION DATE:  07/06/2013  REFERRING MD :  Dr. Lavella Lemons PRIMARY SERVICE: CCM  CHIEF COMPLAINT:  AMS  BRIEF PATIENT DESCRIPTION: 64 F with ICH a r/t HTN and elevated ICP.  SIGNIFICANT EVENTS / STUDIES: ICH 07/06/2013 s/p ventric ventric removed and replaced 9/8 Lovell Sheehan) 3% saline started 9/8, stopped 9/10  CULTURES: None  ANTIBIOTICS: Cefazolin 9/7 >> 9/12   SUBJECTIVE:  Headache much improved.   ventric still in place afebrile  VITAL SIGNS: Temp:  [97.7 F (36.5 C)-99 F (37.2 C)] 97.7 F (36.5 C) (09/14 0800) Pulse Rate:  [61-88] 78 (09/14 0800) Resp:  [0-21] 14 (09/14 0800) BP: (115-161)/(71-103) 148/79 mmHg (09/14 0800) SpO2:  [82 %-100 %] 100 % (09/14 0800) Weight:  [242 lb 4.6 oz (109.9 kg)] 242 lb 4.6 oz (109.9 kg) (09/14 0500)  HEMODYNAMICS:    VENTILATOR SETTINGS:    INTAKE / OUTPUT: Intake/Output     09/13 0701 - 09/14 0700 09/14 0701 - 09/15 0700   P.O. 630    IV Piggyback     Total Intake(mL/kg) 630 (5.7)    Drains 302 12   Total Output 302 12   Net +328 -12        Urine Occurrence 7 x      PHYSICAL EXAMINATION: General:  Obese F in NAD Neuro:  Awake and interacting, more alert, MAE, appropriate HEENT:  Sclera anicteric, pupil equal and reactive bilaterally, MMM, OP clear, L ventric  Neck:  Trachea supple and midline, (-) JVD, (-) LAN Cardiovascular:  RRR, NS1/S2, (-) MRG Lungs:  resps even non labored on [place], CTAB Abdomen:  S/NT/ND/(+)BS Musculoskeletal:  (-) C/C/E, OA toes biltaerally Skin:  Intact  LABS:  CBC Recent Labs     07/11/13  0500  07/12/13  0500  WBC  6.1  6.5  HGB  12.9  12.8  HCT  39.3  39.7  PLT  201  190    Coag's No results found for this basename: APTT, INR,  in the last 72 hours  BMET Recent Labs     07/11/13  0500  07/11/13  0914  07/12/13  0500  NA  140  136  137  K  2.9*   2.7*  3.6  CL  97  92*  94*  CO2  36*  33*  36*  BUN  16  13  14   CREATININE  0.86  0.80  0.97  GLUCOSE  97  136*  102*    Electrolytes Recent Labs     07/11/13  0500  07/11/13  0914  07/12/13  0500  CALCIUM  9.3  9.4  9.5  MG   --    --   2.1  PHOS   --    --   3.8    Sepsis Markers No results found for this basename: LACTICACIDVEN, PROCALCITON, O2SATVEN,  in the last 72 hours  ABG No results found for this basename: PHART, PCO2ART, PO2ART,  in the last 72 hours  Liver Enzymes Recent Labs     07/11/13  0500  AST  16  ALT  7  ALKPHOS  52  BILITOT  0.5  ALBUMIN  3.0*    Cardiac Enzymes No results found for this basename: TROPONINI, PROBNP,  in the last 72 hours  Glucose No results found for this basename: GLUCAP,  in the last 72 hours  Imaging No results found.   CXR:  No new CXR 9/14   ASSESSMENT / PLAN: Principal Problem:   Intracerebral hemorrhage Active Problems:   HYPERTENSION   PULMONARY A:  No active issues P:   she is protecting airway adequately and mental status improved  CARDIOVASCULAR A:  HTN: Intrinsic + reactive. Goal SBP <= 140 P:  Nicardipine Drip off 9/8 Cont hctz, lisinopril   RENAL A: Hypokalemia  P:   Replace K PRN F/u BMP  GASTROINTESTINAL A: Tol PO's   HEMATOLOGIC A:   No Acute Issues F/u cbc   INFECTIOUS A: Cefazolin 9/7 > 9/12 for ventric prophylaxis  ENDOCRINE A:   No Acute Issues  NEUROLOGIC A:  Intracerebral Hemorrhage Intraventricular blood and enlargement s/p ventriculostomy 9/7 and replaced 9/8 S/p 3% saline - stopped 9/10  P:   Cont Valproate  Out of ICU when ventric able to be removed - still draining good amount of bloody CSF F/u imaging per neurology's plans PRN pain rx for headache    WHITEHEART,KATHRYN, NP 07/13/2013  8:34 AM Pager: [phone] or [phone]  *Care during the described time interval was provided by me and/or other providers on the critical care team. I  have reviewed this patient's available data, including medical history, events of note, physical examination and test results as part of my evaluation.  Independently examined pt, evaluated data & formulated above care plan with NP  [facility] V.

## 2013-07-13 NOTE — Progress Notes (Signed)
Stroke Team Progress Note  HISTORY Alison Ward is an 52 y.o. female with a history of hypertension and poor compliance with taking medication, presenting with confusion and headache as well as lethargy. Patient was confused and called family members to get directions home. She was noted to be outside of the home but unknown resident. A pedestrian called EMS. Emergency services subsequently located family members. Blood pressure on arrival in the emergency room was 202/98. CT scan of her head showed a 4.1 x 3.1 cm parietotemporal intracerebral infarction with surrounding edema and intraventricular extension of hemorrhage. Patient had a 6 mm right to left midline shift. There is no previous history of stroke nor TIA. Patient has not been on antiplatelet therapy. NIH stroke score was 20.  LSN: Unclear  tPA Given: No: ICH  MRankin: 4  Patient was not a TPA candidate secondary to ICH  She was admitted to the neuro ICU for further evaluation and treatment.  Patient initially had to undergo placement of right frontal ventriculostomy via a burr hole but continued to have decreased mental status and on 07/07/2013 had to undergo placement of left frontal ventriculostomy via a burr hole and removal of right ventriculostomy.   SUBJECTIVE  Progressing well. No neurological changes. Awake, alert. Says she is sleeping well.     OBJECTIVE Most recent Vital Signs: Filed Vitals:   07/13/13 0400 07/13/13 0500 07/13/13 0600 07/13/13 0700  BP: 127/89 132/94 133/92 132/89  Pulse: 75 77 77 75  Temp:      TempSrc:      Resp: 14 17 15  0  Height:      Weight:  109.9 kg (242 lb 4.6 oz)    SpO2:  100% 93% 98%   CBG (last 3)  No results found for this basename: GLUCAP,  in the last 72 hours  IV Fluid Intake:      MEDICATIONS  . divalproex  500 mg Oral Q8H  . hydrochlorothiazide  25 mg Oral Daily  . lisinopril  5 mg Oral Daily  . pantoprazole  40 mg Oral Daily  . senna-docusate  1 tablet Oral BID    PRN:  acetaminophen, acetaminophen, labetalol, LORazepam, ondansetron, oxyCODONE  Diet:  General Activity: sitting up in bed DVT Prophylaxis:  SCDs CLINICALLY SIGNIFICANT STUDIES Basic Metabolic Panel:   Recent Labs Lab 07/09/13 0258  07/11/13 0914 07/12/13 0500  NA 152*  < > 136 137  K 3.1*  < > 2.7* 3.6  CL 110  < > 92* 94*  CO2 33*  < > 33* 36*  GLUCOSE 83  < > 136* 102*  BUN 18  < > 13 14  CREATININE 0.83  < > 0.80 0.97  CALCIUM 9.5  < > 9.4 9.5  MG 2.3  --   --  2.1  PHOS 3.1  --   --  3.8  < > = values in this interval not displayed. Liver Function Tests:   Recent Labs Lab 07/11/13 0500  AST 16  ALT 7  ALKPHOS 52  BILITOT 0.5  PROT 7.1  ALBUMIN 3.0*   CBC:   Recent Labs Lab 07/11/13 0500 07/12/13 0500  WBC 6.1 6.5  HGB 12.9 12.8  HCT 39.3 39.7  MCV 85.1 85.9  PLT 201 190   Coagulation:  No results found for this basename: LABPROT, INR,  in the last 168 hours Cardiac Enzymes: No results found for this basename: CKTOTAL, CKMB, CKMBINDEX, TROPONINI,  in the last 168 hours Urinalysis:  No  results found for this basename: COLORURINE, APPERANCEUR, LABSPEC, PHURINE, GLUCOSEU, HGBUR, BILIRUBINUR, KETONESUR, PROTEINUR, UROBILINOGEN, NITRITE, LEUKOCYTESUR,  in the last 168 hours Lipid Panel    Component Value Date/Time   CHOL 244* 07/06/2013 0420   TRIG 153* 07/06/2013 0420   HDL 45 07/06/2013 0420   CHOLHDL 5.4 07/06/2013 0420   VLDL 31 07/06/2013 0420   LDLCALC 168* 07/06/2013 0420   HgbA1C  Lab Results  Component Value Date   HGBA1C 6.0* 07/06/2013    Urine Drug Screen:   No results found for this basename: labopia,  cocainscrnur,  labbenz,  amphetmu,  thcu,  labbarb    Alcohol Level: No results found for this basename: ETH,  in the last 168 hours  Ct Angio Head W/cm &/or Wo Cm 07/07/2013   A right parietal and temporal lobe hemorrhage is not significantly changed in size surrounding edema is as expected. Intraventricular hemorrhage is more pronounced than  on the prior exam.  The right frontal ventriculostomy catheter is now in place. There is minimal hemorrhage along the shunt site.  Subarachnoid blood over the left parietal convexity is again noted.  There is no significant hydrocephalus.  The paranasal sinuses and mastoid air cells are clear.  The osseous skull is otherwise intact.  The postcontrast images demonstrate no pathologic enhancement.  CTA images demonstrate atherosclerotic calcifications within the cavernous carotid arteries bilaterally.  Enlarged empty sella is noted.  No significant vascular stenoses are evident.  The right A1 segment is dominant.  The anterior communicating artery is patent. The MCA bifurcations are within normal limits bilaterally.  The ACA and MCA branch vessels are normal.  No significant vascular lesions are associated with the area of hemorrhage.  The vertebral arteries are codominant.  The basilar artery is within normal limits.  Prominent posterior communicating arteries are present bilaterally.  P1 segments contribute as well, more equally on the left.  There is some irregularity of distal PCA branch vessels.  The dural sinuses are patent.   Review of the MIP images confirms the above findings.  IMPRESSION:  1.  Minimal distal small vessel disease. 2.  No significant vascular lesion associated with the area of hemorrhage. 3.  Stable right parietal parenchymal hemorrhage. 4.  Status post right to the tracheostomy placement without evidence for hydrocephalus. 5.  The extent of intraventricular hemorrhage has increased.     CT Wo Contrast PORTABLE 07/09/2013 Markedly limited exam secondary to motion. There appears to be new dependent left ventricular hemorrhage. Right ventricle hemorrhage and right parietal - posterior temporal hematoma once again noted.   Dg Chest Port 1 View 07/07/2013   1.  Interval placement of left IJ line, tip overlying the level of the superior vena cava - SVC confluence. 2.  No postprocedure  pneumothorax. 3.  Cardiomegaly and vascular congestion.  07/07/2013  Increased pulmonary vascular congestion now with mild pulmonary edema.  Stable cardiomegaly.    MRI of the brain   MRA of the brain    2D Echocardiogram  EF 65%, no regional wall motion abnormality identified, no cardiac source of emboli identified.  Carotid Doppler  Findings suggest 1-39% stenosis bilaterally. Vertebral arteries are patent with antegrade flow.  CXR 07/06/13 Shallow inspiration. Mild cardiac enlargement. No evidence of active pulmonary disease.   EKG Sinus rhythmProbable LVH with secondary repol abnrm   Therapy Recommendations CIR  Physical Exam   Obese middle aged african american lady not in distress.Awake alert. Afebrile. Head is nontraumatic. Neck is supple without bruit.  Hearing is normal. Cardiac exam no murmur or gallop. Lungs are clear to auscultation. Distal pulses are well felt. She has a right frontal ventriculostomy catheter Neurological Exam :  Awake  . No gaze deviation. Pupils equal reactive. Decrease blink to threat on the left. Disoriented to time and place. Diminished attention, registration and recall. Speech is  dysarthric. Follows commands well. Mild left lower facial weakness. Motor system exam reveals no upper or lower extremity drift but patient's cooperation is limited. I suspect mild left grip and hip flexor and ankle dorsiflexor weakness. Reflexes are symmetric. Left plantar is equivocal and right is downgoing. Gait was not tested.   ASSESSMENT Alison Ward is a 52 y.o. female presenting with  Headache, confusion and vision changes secondary to large right pareital parenchymal hemorrhage with intraventricular extension mild cytotoxic edema and hydrocephalous-etiology likely hypertensive with accelerated hypertension. Patient initially had to undergo placement of right frontal ventriculostomy via a burr hole but continued to have decreased mental status and on 07/07/2013 had to  undergo placement of left frontal ventriculostomy via a burr hole and removal of right ventriculostomy.   Patient able to tolerate CT 07/09/2013 There appears to be new dependent left ventricular hemorrhage, in addition to previously known hemorrhagic areas.    Hyperlipidemia, add statin when able to take po/per tube, LDL 168 goal < 100 for non diabetics. Statin ordered.  Cytotoxic edema, 3% saline discontinued.  Accelerated Hypertension  Right ICH, Intraventricular hemorrhage with hydrocephalus  Long term medication use  Hypokalemia, resolved  Hospital day # 7  TREATMENT/PLAN  Continue strict control of hypertension with blood pressure goal below 160. HCTZ home med started. BP stable. Added Lisinopril 5mg  daily.  CIR: plans for keeping ventriculostomy in place into next week. Once out, can transfer out of unit.  Speech: regular, thin diet.   Statin ordered.  CIR consulted.  Plan of care discussed with patient and family  This patient is critically ill and at significant risk of neurological worsening, death and care requires constant monitoring of vital signs, hemodynamics,respiratory and cardiac monitoring,review of multiple databases, neurological assessment, discussion with family, other specialists and medical decision making of high complexity. I spent 30 minutes of neurocritical care time  in the care of  this patient.  Gwendolyn Lima. Manson Passey, [facility], MBA, MHA [facility] Pager: [phone] 07/13/2013 7:49 AM  Patient seen and examined together with physician assistant and I concur with the assessment and plan.  Wyatt Portela, MD

## 2013-07-13 NOTE — Progress Notes (Signed)
Subjective: Patient reports IVC draining well.  Patient doing well.  Objective: Vital signs in last 24 hours: Temp:  [97.7 F (36.5 C)-99 F (37.2 C)] 99 F (37.2 C) (09/14 0300) Pulse Rate:  [61-88] 75 (09/14 0700) Resp:  [0-21] 0 (09/14 0700) BP: (115-161)/(71-103) 132/89 mmHg (09/14 0700) SpO2:  [82 %-100 %] 98 % (09/14 0700) Weight:  [109.9 kg (242 lb 4.6 oz)] 109.9 kg (242 lb 4.6 oz) (09/14 0500)  Intake/Output from previous day: 09/13 0701 - 09/14 0700 In: 630 [P.O.:630] Out: 302 [Drains:302] Intake/Output this shift:    Physical Exam: IVC draining bloody fluid.  Patient stable neurologically.    Lab Results:  Recent Labs  07/11/13 0500 07/12/13 0500  WBC 6.1 6.5  HGB 12.9 12.8  HCT 39.3 39.7  PLT 201 190   BMET  Recent Labs  07/11/13 0914 07/12/13 0500  NA 136 137  K 2.7* 3.6  CL 92* 94*  CO2 33* 36*  GLUCOSE 136* 102*  BUN 13 14  CREATININE 0.80 0.97  CALCIUM 9.4 9.5    Studies/Results: No results found.  Assessment/Plan: Patient doing well.  Will begin to raise IVC, possibly tomorrow, per Dr. Lovell Sheehan plan.    LOS: 7 days    Dorian Heckle, MD 07/13/2013, 7:44 AM

## 2013-07-14 LAB — BASIC METABOLIC PANEL
BUN: 20 mg/dL (ref 6–23)
CO2: 34 mEq/L — ABNORMAL HIGH (ref 19–32)
Chloride: 89 mEq/L — ABNORMAL LOW (ref 96–112)
Creatinine, Ser: 1.17 mg/dL — ABNORMAL HIGH (ref 0.50–1.10)

## 2013-07-14 MED ORDER — POTASSIUM CHLORIDE CRYS ER 20 MEQ PO TBCR: 40.0000 meq | EXTENDED_RELEASE_TABLET | Freq: Three times a day (TID) | ORAL | Status: DC

## 2013-07-14 MED ORDER — ENSURE COMPLETE PO LIQD
237.0000 mL | Freq: Two times a day (BID) | ORAL | Status: DC
  Administered 2013-07-14 – 2013-07-21 (×13): 237 mL via ORAL

## 2013-07-14 MED ORDER — ENOXAPARIN SODIUM 40 MG/0.4ML ~~LOC~~ SOLN
40.0000 mg | SUBCUTANEOUS | Status: DC
  Administered 2013-07-14 – 2013-07-21 (×8): 40 mg via SUBCUTANEOUS
  Filled 2013-07-14 (×8): qty 0.4

## 2013-07-14 MED ORDER — POTASSIUM CHLORIDE CRYS ER 20 MEQ PO TBCR
40.0000 meq | EXTENDED_RELEASE_TABLET | ORAL | Status: AC
  Administered 2013-07-14 (×2): 40 meq via ORAL
  Filled 2013-07-14 (×3): qty 2

## 2013-07-14 NOTE — Progress Notes (Signed)
PULMONARY  / CRITICAL CARE MEDICINE  Name: LENETTE RAU MRN: [id number] DOB: 01-28-1961    ADMISSION DATE:  07/06/2013 CONSULTATION DATE:  07/06/2013  REFERRING MD :  Dr. Lavella Lemons PRIMARY SERVICE: CCM  CHIEF COMPLAINT:  AMS  BRIEF PATIENT DESCRIPTION: 9 F with ICH a r/t HTN and elevated ICP.  SIGNIFICANT EVENTS / STUDIES: ICH 07/06/2013 s/p ventric ventric removed and replaced 9/8 Lovell Sheehan) 3% saline started 9/8, stopped 9/10  CULTURES: None  ANTIBIOTICS: Cefazolin 9/7 >> 9/12   SUBJECTIVE:  Headache much improved.    VITAL SIGNS: Temp:  [97.9 F (36.6 C)-99.1 F (37.3 C)] 98 F (36.7 C) (09/15 0745) Pulse Rate:  [79-88] 79 (09/15 1000) Resp:  [4-26] 18 (09/15 1000) BP: (122-163)/(74-97) 138/78 mmHg (09/15 1000) SpO2:  [92 %-99 %] 95 % (09/15 1000) Weight:  [107.1 kg (236 lb 1.8 oz)] 107.1 kg (236 lb 1.8 oz) (09/15 0314)  HEMODYNAMICS:    VENTILATOR SETTINGS:    INTAKE / OUTPUT: Intake/Output     09/14 0701 - 09/15 0700 09/15 0701 - 09/16 0700   P.O. 660 100   Total Intake(mL/kg) 660 (6.2) 100 (0.9)   Drains 276 34   Total Output 276 34   Net +384 +66        Urine Occurrence 8 x      PHYSICAL EXAMINATION: General:  Obese F in NAD Neuro:  Awake and interacting, more alert, MAE, appropriate HEENT:  Sclera anicteric, pupil equal and reactive bilaterally, MMM, OP clear, L ventric  Neck:  Trachea supple and midline, (-) JVD, (-) LAN Cardiovascular:  RRR, NS1/S2, (-) MRG Lungs:  resps even non labored on Cash, CTAB Abdomen:  S/NT/ND/(+)BS Musculoskeletal:  (-) C/C/E, OA toes biltaerally Skin:  Intact  LABS:  CBC Recent Labs     07/12/13  0500  WBC  6.5  HGB  12.8  HCT  39.7  PLT  190    Coag's No results found for this basename: APTT, INR,  in the last 72 hours  BMET Recent Labs     07/12/13  0500  07/14/13  0410  NA  137  134*  K  3.6  3.0*  CL  94*  89*  CO2  36*  34*  BUN  14  20  CREATININE  0.97  1.17*  GLUCOSE  102*  105*     Electrolytes Recent Labs     07/12/13  0500  07/14/13  0410  CALCIUM  9.5  9.3  MG  2.1   --   PHOS  3.8   --     Sepsis Markers No results found for this basename: LACTICACIDVEN, PROCALCITON, O2SATVEN,  in the last 72 hours  ABG No results found for this basename: PHART, PCO2ART, PO2ART,  in the last 72 hours  Liver Enzymes No results found for this basename: AST, ALT, ALKPHOS, BILITOT, ALBUMIN,  in the last 72 hours  Cardiac Enzymes No results found for this basename: TROPONINI, PROBNP,  in the last 72 hours  Glucose No results found for this basename: GLUCAP,  in the last 72 hours  Imaging No results found.   CXR:  No new CXR 9/14   ASSESSMENT / PLAN: Principal Problem:   Intracerebral hemorrhage Active Problems:   HYPERTENSION   PULMONARY A:  No active issues P:   - She is protecting airway adequately and mental status improved.  CARDIOVASCULAR A:  HTN: Intrinsic + reactive. Goal SBP <= 140 P:  - Nicardipine Drip off  9/8. - Cont hctz, lisinopril.  RENAL A: Hypokalemia  P:   - K-dur 40 meq PO x2 doses. - F/u BMP in AM.  GASTROINTESTINAL A: - Tolerating PO's well.  Continue diet.  HEMATOLOGIC A:   - No Acute Issues. - F/u CBC.  INFECTIOUS A: - Cefazolin 9/7 > 9/12 for ventric prophylaxis, now off.  ENDOCRINE A:   - No Acute Issues.  NEUROLOGIC A:  - Intracerebral Hemorrhage. - Intraventricular blood and enlargement s/p ventriculostomy 9/7 and replaced 9/8. - S/p 3% saline - stopped 9/10.  P:   - Cont Valproate. - Out of ICU when ventric able to be removed - still draining good amount of bloody CSF and NS would like to keep in place for a few more days. - F/u imaging per neurology's plans. - PRN pain rx for headache.  Alyson Reedy, M.D. [facility] Pulmonary/Critical Care Medicine. Pager: [phone]. After hours pager: [phone].

## 2013-07-14 NOTE — Progress Notes (Signed)
Patient ID: Alison Ward, female   DOB: 1961-10-28, 52 y.o.   MRN: [id number] Subjective:  The patient is alert and pleasant. She is in no apparent distress.  Objective: Vital signs in last 24 hours: Temp:  [97.9 F (36.6 C)-99.1 F (37.3 C)] 98 F (36.7 C) (09/15 0745) Pulse Rate:  [79-88] 79 (09/15 1000) Resp:  [4-26] 18 (09/15 1000) BP: (122-163)/(74-97) 138/78 mmHg (09/15 1000) SpO2:  [92 %-99 %] 95 % (09/15 1000) Weight:  [107.1 kg (236 lb 1.8 oz)] 107.1 kg (236 lb 1.8 oz) (09/15 0314)  Intake/Output from previous day: 09/14 0701 - 09/15 0700 In: 660 [P.O.:660] Out: 276 [Drains:276] Intake/Output this shift: Total I/O In: 100 [P.O.:100] Out: 34 [Drains:34]  Physical exam the patient is alert and oriented x3. Her pupils are equal. Her speech is normal. She is moving all 4 extremities well.  The patient's ventriculostomy is patent and draining bloody spinal fluid.  Lab Results:  Recent Labs  07/12/13 0500  WBC 6.5  HGB 12.8  HCT 39.7  PLT 190   BMET  Recent Labs  07/12/13 0500 07/14/13 0410  NA 137 134*  K 3.6 3.0*  CL 94* 89*  CO2 36* 34*  GLUCOSE 102* 105*  BUN 14 20  CREATININE 0.97 1.17*  CALCIUM 9.5 9.3    Studies/Results: No results found.  Assessment/Plan: Right intracerebral hemorrhage, intraventricular hemorrhage, hydrocephalus: The patient's spinal fluid is still very bloody. I'll continue the ventriculostomy another few days and then try to elevate it. The patient may need a ventriculoperitoneal shunt but her spinal fluid is too bloody to put one in now.  LOS: 8 days     Brielle Moro D 07/14/2013, 10:11 AM

## 2013-07-14 NOTE — Progress Notes (Signed)
INITIAL NUTRITION ASSESSMENT  DOCUMENTATION CODES Per approved criteria  -Obesity Unspecified   INTERVENTION: Ensure Complete po BID, each supplement provides 350 kcal and 13 grams of protein.  NUTRITION DIAGNOSIS: Inadequate oral intake related to decreased appetite/cognition as evidenced by Meal Completion: <50%.   Goal: Pt to meet >/= 90% of their estimated nutrition needs   Monitor:  PO intake, weight trend, labs, supplement acceptance  Reason for Assessment: Pt identified as at nutrition risk on the Malnutrition Screen Tool  52 y.o. female  Admitting Dx: Intracerebral hemorrhage  ASSESSMENT: Pt admitted with ICH s/p ventric. Pt remains in ICU with ventric in place. Pt able to answer questions but falls asleep quickly. Family at bedside. Per pt she has had no recent weight changes and had a good appetite PTA.  Per pt her appetite has not been good since hospitalized. Pt is willing to try ensure to better meet her nutrition needs.   Nutrition Focused Physical Exam:  Subcutaneous Fat:  Orbital Region: WNL Upper Arm Region: WNL Thoracic and Lumbar Region: WNL  Muscle:  Temple Region: WNL Clavicle Bone Region: WNL Clavicle and Acromion Bone Region: WNL Scapular Bone Region: WNL Dorsal Hand: WNL Patellar Region: WNL Anterior Thigh Region: WNL Posterior Calf Region: WNL  Edema: not present  Height: Ht Readings from Last 1 Encounters:  07/06/13 5\' 6"  (1.676 m)    Weight: Wt Readings from Last 1 Encounters:  07/14/13 236 lb 1.8 oz (107.1 kg)    Ideal Body Weight: 59 kg   % Ideal Body Weight: 182%  Wt Readings from Last 10 Encounters:  07/14/13 236 lb 1.8 oz (107.1 kg)  07/14/13 236 lb 1.8 oz (107.1 kg)    Usual Body Weight: 225 lb per pt  % Usual Body Weight: 105%  BMI:  Body mass index is 38.13 kg/(m^2).  Estimated Nutritional Needs: Kcal: 1900-2100 Protein: 90-110 grams Fluid: > 2 L/day  Skin: head incisions  Diet Order: General Meal  Completion: <50%   EDUCATION NEEDS: -No education needs identified at this time   Intake/Output Summary (Last 24 hours) at 07/14/13 0835 Last data filed at 07/14/13 0700  Gross per 24 hour  Intake    660 ml  Output    264 ml  Net    396 ml    Last BM: 9/6 last documented   Labs:   Recent Labs Lab 07/08/13 2058 07/09/13 0258  07/11/13 0914 07/12/13 0500 07/14/13 0410  NA 153* 152*  < > 136 137 134*  K  --  3.1*  < > 2.7* 3.6 3.0*  CL  --  110  < > 92* 94* 89*  CO2  --  33*  < > 33* 36* 34*  BUN  --  18  < > 13 14 20   CREATININE  --  0.83  < > 0.80 0.97 1.17*  CALCIUM  --  9.5  < > 9.4 9.5 9.3  MG  --  2.3  --   --  2.1  --   PHOS  --  3.1  --   --  3.8  --   GLUCOSE  --  83  < > 136* 102* 105*  < > = values in this interval not displayed.  CBG (last 3)  No results found for this basename: GLUCAP,  in the last 72 hours  Scheduled Meds: . atorvastatin  40 mg Oral q1800  . calcium carbonate  200 mg of elemental calcium Oral BID WC  . divalproex  500 mg Oral Q8H  . hydrochlorothiazide  25 mg Oral Daily  . lisinopril  5 mg Oral Daily  . pantoprazole  40 mg Oral Daily  . potassium chloride  40 mEq Oral Q4H  . senna-docusate  1 tablet Oral BID    Continuous Infusions:   Past Medical History  Diagnosis Date  . Hypertension     Past Surgical History  Procedure Laterality Date  . Ventriculostomy Left 07/07/2013    Procedure: VENTRICULOSTOMY;  Surgeon: Cristi Loron, MD;  Location: [facility];  Service: Neurosurgery;  Laterality: Left;  Left Ventriculostomy and Removal of Right Vetriculostomy    Kendell Bane RD, LDN, CNSC [phone] Pager [phone] After Hours Pager

## 2013-07-14 NOTE — Progress Notes (Signed)
Stroke Team Progress Note  HISTORY Alison Ward is an 52 y.o. female with a history of hypertension and poor compliance with taking medication, presenting with confusion and headache as well as lethargy. Patient was confused and called family members to get directions home. A pedestrian called EMS. Emergency services subsequently located family members and brought her to the ED. Blood pressure on arrival in the emergency room was 202/98. CT scan of her head showed a 4.1 x 3.1 cm parietotemporal intracerebral infarction with surrounding edema and intraventricular extension of hemorrhage. Patient had a 6 mm right to left midline shift. There is no previous history of stroke nor TIA. Patient has not been on antiplatelet therapy. NIH stroke score was 20. Patient was not a TPA candidate secondary to ICH  She was admitted to the neuro ICU for further evaluation and treatment.  Patient initially had to undergo placement of right frontal ventriculostomy via a burr hole but continued to have decreased mental status and on 07/07/2013 had to undergo placement of left frontal ventriculostomy via a burr hole and removal of right ventriculostomy.   SUBJECTIVE No family at bedside.   OBJECTIVE Most recent Vital Signs: Filed Vitals:   07/14/13 0000 07/14/13 0314 07/14/13 0348 07/14/13 0745  BP:      Pulse: 86     Temp:   97.9 F (36.6 C) 98 F (36.7 C)  TempSrc:   Oral Oral  Resp: 9     Height:      Weight:  107.1 kg (236 lb 1.8 oz)    SpO2: 96%      CBG (last 3)  No results found for this basename: GLUCAP,  in the last 72 hours  IV Fluid Intake:      MEDICATIONS  . atorvastatin  40 mg Oral q1800  . calcium carbonate  200 mg of elemental calcium Oral BID WC  . divalproex  500 mg Oral Q8H  . hydrochlorothiazide  25 mg Oral Daily  . lisinopril  5 mg Oral Daily  . pantoprazole  40 mg Oral Daily  . potassium chloride  40 mEq Oral Q4H  . senna-docusate  1 tablet Oral BID   PRN:  acetaminophen,  acetaminophen, labetalol, LORazepam, ondansetron, oxyCODONE  Diet:  General Activity: bedrest DVT Prophylaxis:  SCDs CLINICALLY SIGNIFICANT STUDIES Basic Metabolic Panel:   Recent Labs Lab 07/09/13 0258  07/12/13 0500 07/14/13 0410  NA 152*  < > 137 134*  K 3.1*  < > 3.6 3.0*  CL 110  < > 94* 89*  CO2 33*  < > 36* 34*  GLUCOSE 83  < > 102* 105*  BUN 18  < > 14 20  CREATININE 0.83  < > 0.97 1.17*  CALCIUM 9.5  < > 9.5 9.3  MG 2.3  --  2.1  --   PHOS 3.1  --  3.8  --   < > = values in this interval not displayed. Liver Function Tests:   Recent Labs Lab 07/11/13 0500  AST 16  ALT 7  ALKPHOS 52  BILITOT 0.5  PROT 7.1  ALBUMIN 3.0*   CBC:   Recent Labs Lab 07/11/13 0500 07/12/13 0500  WBC 6.1 6.5  HGB 12.9 12.8  HCT 39.3 39.7  MCV 85.1 85.9  PLT 201 190   Coagulation:  No results found for this basename: LABPROT, INR,  in the last 168 hours Cardiac Enzymes: No results found for this basename: CKTOTAL, CKMB, CKMBINDEX, TROPONINI,  in the last 168  hours Urinalysis:  No results found for this basename: COLORURINE, APPERANCEUR, LABSPEC, PHURINE, GLUCOSEU, HGBUR, BILIRUBINUR, KETONESUR, PROTEINUR, UROBILINOGEN, NITRITE, LEUKOCYTESUR,  in the last 168 hours Lipid Panel    Component Value Date/Time   CHOL 244* 07/06/2013 0420   TRIG 153* 07/06/2013 0420   HDL 45 07/06/2013 0420   CHOLHDL 5.4 07/06/2013 0420   VLDL 31 07/06/2013 0420   LDLCALC 168* 07/06/2013 0420   HgbA1C  Lab Results  Component Value Date   HGBA1C 6.0* 07/06/2013    Urine Drug Screen:   No results found for this basename: labopia,  cocainscrnur,  labbenz,  amphetmu,  thcu,  labbarb    Alcohol Level: No results found for this basename: ETH,  in the last 168 hours  Ct Angio Head W/cm &/or Wo Cm 07/07/2013    1.  Minimal distal small vessel disease. 2.  No significant vascular lesion associated with the area of hemorrhage. 3.  Stable right parietal parenchymal hemorrhage. 4.  Status post right to the  tracheostomy placement without evidence for hydrocephalus. 5.  The extent of intraventricular hemorrhage has increased.     CT Head  07/09/2013 Markedly limited exam secondary to motion. There appears to be new dependent left ventricular hemorrhage. Right ventricle hemorrhage and right parietal - posterior temporal hematoma once again noted.  07/06/2013 Large intracerebral hematoma at the posterior right parietotemporal lobe, 4.1 x 3.1 cm in size with surrounding edema and evidence of intraventricular extension of hemorrhage. Question minimal subarachnoid blood at high posterior right parieta region. 6 mm of right-to-left midline shift and diffuse sulcal effacement noted.  MRI of the brain   MRA of the brain    2D Echocardiogram  EF 65%, no regional wall motion abnormality identified, no cardiac source of emboli identified.  Carotid Doppler  Findings suggest 1-39% stenosis bilaterally. Vertebral arteries are patent with antegrade flow.  CXR  07/07/2013   1.  Interval placement of left IJ line, tip overlying the level of the superior vena cava - SVC confluence. 2.  No postprocedure pneumothorax. 3.  Cardiomegaly and vascular congestion.  07/07/2013  Increased pulmonary vascular congestion now with mild pulmonary edema.  Stable cardiomegaly.   07/06/13 Shallow inspiration. Mild cardiac enlargement. No evidence of active pulmonary disease.   EKG Sinus rhythmProbable LVH with secondary repol abnrm   Therapy Recommendations CIR  Physical Exam   Obese middle aged african american lady not in distress.Awake alert. Afebrile. Head is nontraumatic. Neck is supple without bruit. Hearing is normal. Cardiac exam no murmur or gallop. Lungs are clear to auscultation. Distal pulses are well felt. She has a right frontal ventriculostomy catheter Neurological Exam :  Awake  . No gaze deviation. Pupils equal reactive. Decrease blink to threat on the left. Disoriented to time and place. Diminished attention,  registration and recall. Speech is  dysarthric. Follows commands well. Mild left lower facial weakness. Motor system exam reveals no upper or lower extremity drift but patient's cooperation is limited. I suspect mild left grip and hip flexor and ankle dorsiflexor weakness. Reflexes are symmetric. Left plantar is equivocal and right is downgoing. Gait was not tested.   ASSESSMENT Ms. TOSHIBA NULL is a 52 y.o. 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 Patient initially underwent placement of right frontal ventriculostomy, due to  continued decreased mental status had to undergo  placement of left frontal ventriculostomy with removal of right ventriculostomy. She had intermittent development 07/09/2013  Of a new dependent left ventricular hemorrhage.    Hyperlipidemia, new lipitor, LDL 168 goal < 100 for non diabetics.   Cytotoxic edema, 3% saline discontinued.  Malignant Hypertension. HCTZ home med resumed. Added Lisinopril 5mg  daily.  Hypokalemia, resolved  Hospital day # 8  TREATMENT/PLAN  SBP goal < 180  Neurosurgeon following ventriculostomy - still with bloody drainage, plan to monitor a few more days  Ok to start lovenox for VTE prophy, will d/c SCDs  CIR following  Annie Main, MSN, RN, ANVP-BC, ANP-BC, GNP-BC [facility] Pager: [phone] 07/14/2013 8:46 AM  I have personally obtained a history, examined the patient, evaluated imaging results, and formulated the assessment and plan of care. I agree with the above. Delia Heady, MD

## 2013-07-14 NOTE — Progress Notes (Signed)
Physical Therapy Treatment Patient Details Name: Alison Ward MRN: [id number] DOB: Mar 27, 1961 Today's Date: 07/14/2013 Time: 4098-1191 PT Time Calculation (min): 24 min  PT Assessment / Plan / Recommendation  History of Present Illness Alison Ward is an 52 y.o. female with a history of hypertension and poor compliance with taking medication, presenting with confusion and headache as well as lethargy. Patient was confused and called family members to get directions home. She was noted to be outside of the home but unknown resident. A pedestrian called EMS. Emergency services subsequently located family members. Blood pressure on arrival in the emergency room was 202/98. CT scan of her head showed a 4.1 x 3.1 cm parietotemporal intracerebral infarction with surrounding edema and intraventricular extension of hemorrhage. Patient had a 6 mm right to left midline shift. There is no previous history of stroke nor TIA. Patient has not been on antiplatelet therapy. NIH stroke score was 20. 54 F with ICH and elavated ICP   PT Comments   Pt admitted with above. Pt currently with functional limitations due to continued cognitive and balance deficits.  Will benefit  from skilled PT to increase their independence and safety with mobility to allow discharge to the venue listed below.    Follow Up Recommendations  CIR;Supervision/Assistance - 24 hour                 Equipment Recommendations  Other (comment) (TBA)    Recommendations for Other Services Rehab consult  Frequency Min 3X/week   Progress towards PT Goals Progress towards PT goals: Progressing toward goals  Plan Current plan remains appropriate    Precautions / Restrictions Precautions Precautions: Fall Precaution Comments: ventricular shunt  Restrictions Weight Bearing Restrictions: No   Pertinent Vitals/Pain VSS, no pain    Mobility  Bed Mobility Bed Mobility: Supine to Sit;Sitting - Scoot to Edge of Bed Supine to Sit: 3:  Mod assist;HOB elevated Sitting - Scoot to Edge of Bed: 2: Max assist Details for Bed Mobility Assistance: Pt needs cues and some assist to move LEs off bed and to elevate trunk. Transfers Sit to Stand: 1: +2 Total assist;With upper extremity assist;From bed Sit to Stand: Patient Percentage: 60% Stand to Sit: 1: +2 Total assist;Without upper extremity assist;To chair/3-in-1 Stand to Sit: Patient Percentage: 30% Stand Pivot Transfers: 1: +2 Total assist Stand Pivot Transfers: Patient Percentage: 60% Lateral/Scoot Transfers: Not tested (comment) Details for Transfer Assistance: Pt with total verbal and tactile cues for hand placement and sequencing for transfers. Demonstrated decreased control for descent for stand>sit.  Pt with posterior lean upon standing.  Pt needed cues and assist to take steps to stand and turn to chair.   Ambulation/Gait Ambulation/Gait Assistance: Not tested (comment) Stairs: No Wheelchair Mobility Wheelchair Mobility: No Modified Rankin (Stroke Patients Only) Pre-Morbid Rankin Score: No symptoms Modified Rankin: Severe disability     PT Goals (current goals can now be found in the care plan section)    Visit Information  Last PT Received On: 07/14/13 Assistance Needed: +2 PT/OT Co-Evaluation/Treatment: Yes History of Present Illness: Alison Ward is an 52 y.o. female with a history of hypertension and poor compliance with taking medication, presenting with confusion and headache as well as lethargy. Patient was confused and called family members to get directions home. She was noted to be outside of the home but unknown resident. A pedestrian called EMS. Emergency services subsequently located family members. Blood pressure on arrival in the emergency room was 202/98. CT scan of  her head showed a 4.1 x 3.1 cm parietotemporal intracerebral infarction with surrounding edema and intraventricular extension of hemorrhage. Patient had a 6 mm right to left midline  shift. There is no previous history of stroke nor TIA. Patient has not been on antiplatelet therapy. NIH stroke score was 20. 60 F with ICH and elavated ICP    Subjective Data  Subjective: "I would like to try."   Cognition  Cognition Arousal/Alertness: Lethargic Behavior During Therapy: WFL for tasks assessed/performed Overall Cognitive Status: Impaired/Different from baseline Area of Impairment: Attention;Following commands;Safety/judgement;Problem solving Current Attention Level: Sustained Following Commands: Follows one step commands inconsistently;Follows one step commands with increased time Safety/Judgement: Decreased awareness of safety;Decreased awareness of deficits Problem Solving: Slow processing;Decreased initiation;Difficulty sequencing;Requires verbal cues;Requires tactile cues    Balance  Balance Balance Assessed: Yes Static Sitting Balance Static Sitting - Balance Support: Bilateral upper extremity supported;Feet supported Static Sitting - Level of Assistance: 4: Min assist;3: Mod assist;2: Max assist Static Sitting - Comment/# of Minutes: Varied from max to min guard asssit with tendendcy to have posterior lean, 10 minutes.  LEvel of arousal and pt's animation decreases when pt sits up.    End of Session PT - End of Session Equipment Utilized During Treatment: Gait belt Activity Tolerance: Patient limited by fatigue Patient left: in chair;with call bell/phone within reach;with family/visitor present Nurse Communication: Mobility status        INGOLD,Dreya Buhrman 07/14/2013, 1:14 PM [facility] [phone] (pager)

## 2013-07-14 NOTE — Progress Notes (Signed)
eLink Physician-Brief Progress Note Patient Name: COLLENE MASSIMINO DOB: 05/11/1961 MRN: [id number]  Date of Service  07/14/2013   HPI/Events of Note  Hypokalemia  eICU Interventions  Potassium replaced   Intervention Category Intermediate Interventions: Electrolyte abnormality - evaluation and management  Lesleigh Hughson 07/14/2013, 5:51 AM

## 2013-07-14 NOTE — Progress Notes (Signed)
Occupational Therapy Treatment Patient Details Name: Alison Ward MRN: [id number] DOB: 21-Oct-1961 Today's Date: 07/14/2013 Time: 1478-2956 OT Time Calculation (min): 23 min  OT Assessment / Plan / Recommendation  History of present illness Alison Ward is an 52 y.o. female with a history of hypertension and poor compliance with taking medication, presenting with confusion and headache as well as lethargy. Patient was confused and called family members to get directions home. She was noted to be outside of the home but unknown resident. A pedestrian called EMS. Emergency services subsequently located family members. Blood pressure on arrival in the emergency room was 202/98. CT scan of her head showed a 4.1 x 3.1 cm parietotemporal intracerebral infarction with surrounding edema and intraventricular extension of hemorrhage. Patient had a 6 mm right to left midline shift. There is no previous history of stroke nor TIA. Patient has not been on antiplatelet therapy. NIH stroke score was 20. 19 F with ICH and elavated ICP   OT comments  Pt making progress just slowly due to lethargy. Will continue to benefit from acute OT with follow up OT on CIR  Follow Up Recommendations  CIR       Equipment Recommendations  3 in 1 bedside comode    Recommendations for Other Services Rehab consult  Frequency Min 2X/week   Progress towards OT Goals Progress towards OT goals: Progressing toward goals  Plan Discharge plan remains appropriate    Precautions / Restrictions Precautions Precautions: Fall Precaution Comments: ventricular shunt  Restrictions Weight Bearing Restrictions: No       ADL  Toilet Transfer: +2 Total assistance Toilet Transfer: Patient Percentage: 60% Toilet Transfer Method: Stand pivot Toilet Transfer Equipment:  (Bed to recliner on her left) Transfers/Ambulation Related to ADLs: total A +2 (pt=60%) sit>stand; (30%) stand >sit ADL Comments: Worked on trying to assess vison  more while she was seated EOB.  Pt would track to the right and the close her eyes; could not get her to track to the left. Pt reports that she feels dizzy (PT thinks she has some vestibular issues)      OT Goals(current goals can now be found in the care plan section)    Visit Information  Last OT Received On: 07/14/13 Assistance Needed: +2 PT/OT Co-Evaluation/Treatment: Yes History of Present Illness: Alison Ward is an 52 y.o. female with a history of hypertension and poor compliance with taking medication, presenting with confusion and headache as well as lethargy. Patient was confused and called family members to get directions home. She was noted to be outside of the home but unknown resident. A pedestrian called EMS. Emergency services subsequently located family members. Blood pressure on arrival in the emergency room was 202/98. CT scan of her head showed a 4.1 x 3.1 cm parietotemporal intracerebral infarction with surrounding edema and intraventricular extension of hemorrhage. Patient had a 6 mm right to left midline shift. There is no previous history of stroke nor TIA. Patient has not been on antiplatelet therapy. NIH stroke score was 20. 2 F with ICH and elavated ICP          Cognition  Cognition Arousal/Alertness: Lethargic Behavior During Therapy: WFL for tasks assessed/performed Overall Cognitive Status: Impaired/Different from baseline Area of Impairment: Attention;Following commands;Safety/judgement;Problem solving Current Attention Level: Sustained Following Commands: Follows one step commands inconsistently;Follows one step commands with increased time Safety/Judgement: Decreased awareness of safety;Decreased awareness of deficits Problem Solving: Slow processing;Decreased initiation;Difficulty sequencing;Requires verbal cues;Requires tactile cues    Mobility  Bed Mobility Bed Mobility: Supine to Sit;Sitting - Scoot to Edge of Bed Supine to Sit: 3: Mod assist;HOB  elevated Sitting - Scoot to [place] of Bed: 2: Max assist Transfers Transfers: Sit to Stand;Stand to Sit Sit to Stand: 1: +2 Total assist;With upper extremity assist;From bed Sit to Stand: Patient Percentage: 60% Stand to Sit: 1: +2 Total assist;Without upper extremity assist;To chair/3-in-1 Stand to Sit: Patient Percentage: 30% Details for Transfer Assistance: Pt with total verbal and tactile cues for hand placement and sequencing for transfers. Demonstrated decreased control for descent for stand>sit       Balance Balance Balance Assessed: Yes Static Sitting Balance Static Sitting - Balance Support: Bilateral upper extremity supported;Feet supported Static Sitting - Comment/# of Minutes: Varied form Max A to min guard A with tendency to fall backwards; 10 mintues   End of Session OT - End of Session Equipment Utilized During Treatment: Gait belt Activity Tolerance: Patient limited by lethargy Patient left: in chair;with call bell/phone within reach;with chair alarm set;with family/visitor present;with restraints reapplied Nurse Communication: Mobility status       Evette Georges [phone] 07/14/2013, 12:02 PM

## 2013-07-15 LAB — BASIC METABOLIC PANEL
Calcium: 9.5 mg/dL (ref 8.4–10.5)
Creatinine, Ser: 1.28 mg/dL — ABNORMAL HIGH (ref 0.50–1.10)
Potassium: 3.5 mEq/L (ref 3.5–5.1)
Sodium: 134 mEq/L — ABNORMAL LOW (ref 135–145)

## 2013-07-15 LAB — CBC
Hemoglobin: 13.3 g/dL (ref 12.0–15.0)
MCHC: 32.8 g/dL (ref 30.0–36.0)
RDW: 15.8 % — ABNORMAL HIGH (ref 11.5–15.5)

## 2013-07-15 LAB — MAGNESIUM: Magnesium: 2.3 mg/dL (ref 1.5–2.5)

## 2013-07-15 MED ORDER — POTASSIUM CHLORIDE CRYS ER 20 MEQ PO TBCR
40.0000 meq | EXTENDED_RELEASE_TABLET | Freq: Once | ORAL | Status: AC
  Administered 2013-07-15: 40 meq via ORAL
  Filled 2013-07-15: qty 2

## 2013-07-15 MED ORDER — SODIUM CHLORIDE 0.9 % IV BOLUS (SEPSIS)
500.0000 mL | INTRAVENOUS | Status: AC
  Administered 2013-07-15: 500 mL via INTRAVENOUS

## 2013-07-15 MED ORDER — MAGNESIUM HYDROXIDE 400 MG/5ML PO SUSP
30.0000 mL | Freq: Every day | ORAL | Status: DC | PRN
  Administered 2013-07-15: 30 mL via ORAL
  Filled 2013-07-15: qty 30

## 2013-07-15 MED ORDER — POTASSIUM CHLORIDE 20 MEQ/15ML (10%) PO LIQD
40.0000 meq | Freq: Three times a day (TID) | ORAL | Status: DC
  Administered 2013-07-15: 40 meq
  Filled 2013-07-15 (×2): qty 30

## 2013-07-15 NOTE — Progress Notes (Signed)
Physical Therapy Treatment Patient Details Name: Alison Ward MRN: [id number] DOB: [birth year] Today's Date: 07/15/2013 Time: 4098-1191 PT Time Calculation (min): 38 min  PT Assessment / Plan / Recommendation  History of Present Illness Alison Ward is an 52 y.o. female with a history of hypertension and poor compliance with taking medication, presenting with confusion and headache as well as lethargy. Patient was confused and called family members to get directions home. She was noted to be outside of the home but unknown resident. A pedestrian called EMS. Emergency services subsequently located family members. Blood pressure on arrival in the emergency room was 202/98. CT scan of her head showed a 4.1 x 3.1 cm parietotemporal intracerebral infarction with surrounding edema and intraventricular extension of hemorrhage. Patient had a 6 mm right to left midline shift. There is no previous history of stroke nor TIA. Patient has not been on antiplatelet therapy. NIH stroke score was 20. 43 F with ICH and elavated ICP   PT Comments   Pt admitted with above. Pt currently with functional limitations due to continued deficits in level of arousal when OOB as well as BP issues today limiting treatment.  Nursing aware.   Pt will benefit from skilled PT to increase their independence and safety with mobility to allow discharge to the venue listed below.   Follow Up Recommendations  CIR;Supervision/Assistance - 24 hour                 Equipment Recommendations  Other (comment) (TBA)    Recommendations for Other Services Rehab consult  Frequency Min 3X/week   Progress towards PT Goals Progress towards PT goals: Not progressing toward goals - comment (Having BP issues )  Plan Current plan remains appropriate    Precautions / Restrictions Precautions Precautions: Fall Precaution Comments: ventricular shunt  Restrictions Weight Bearing Restrictions: No   Pertinent Vitals/Pain BP at rest  121/80; Sitting in chair 70/49, after 3 min in chair 61/40, after 5 min 70/43.  Nursing aware and gave bolus and BP to 98/43.  Assisted nurse to get pt to bed.  Other VSS, no pain per pt.    Mobility  Bed Mobility Bed Mobility: Supine to Sit;Sitting - Scoot to Edge of Bed Supine to Sit: 3: Mod assist;HOB elevated Sitting - Scoot to Edge of Bed: 2: Max assist Sit to Supine: 1: +2 Total assist Sit to Supine: Patient Percentage: 50% Details for Bed Mobility Assistance: Takes incr time to follow commands to scoot to EOB. Have noted that pt may have right BPPV as noted right rotary nystagmus.  Cannot treat at thsi time given her shunt.  May need eventual f/u for treatment when medical issues improve.   Transfers Transfers: Sit to Stand;Stand to Sit;Stand Pivot Transfers Sit to Stand: 1: +2 Total assist;With upper extremity assist;From bed Sit to Stand: Patient Percentage: 60% Stand to Sit: 1: +2 Total assist;Without upper extremity assist;To chair/3-in-1 Stand to Sit: Patient Percentage: 30% Stand Pivot Transfers: 1: +2 Total assist Stand Pivot Transfers: Patient Percentage: 60% Lateral/Scoot Transfers: Not tested (comment) Details for Transfer Assistance: Pt with total verbal and tactile cues for hand placement and sequencing for transfers. Demonstrated decreased control for descent for stand>sit.  Pt with posterior lean upon standing.  Pt needed cues and assist to take steps to stand and turn to chair.  Once in chair, BP taken and BP was 70/49 (121/80) initially.  Notified nursing.  After 3 minutes, BP 61/40 and 5 minutes 70/43.  Nursing called MD  who ordered a bolus which nurse gave.  BP 95/43. Before PT and nursing could get pt back to bed, pt had an episode for up to 30 seconds of not responding and eyes with blank stare.  Pulled recliner leg up and pt began speaking again.   Asssited nursing in gettting pt back to bed given BP issues with need for same assist as getting up.    Ambulation/Gait Ambulation/Gait Assistance: Not tested (comment) Stairs: No Wheelchair Mobility Wheelchair Mobility: No Modified Rankin (Stroke Patients Only) Pre-Morbid Rankin Score: No symptoms Modified Rankin: Severe disability     PT Goals (current goals can now be found in the care plan section)    Visit Information  Last PT Received On: 07/15/13 Assistance Needed: +2 History of Present Illness: Alison Ward is an 52 y.o. female with a history of hypertension and poor compliance with taking medication, presenting with confusion and headache as well as lethargy. Patient was confused and called family members to get directions home. She was noted to be outside of the home but unknown resident. A pedestrian called EMS. Emergency services subsequently located family members. Blood pressure on arrival in the emergency room was 202/98. CT scan of her head showed a 4.1 x 3.1 cm parietotemporal intracerebral infarction with surrounding edema and intraventricular extension of hemorrhage. Patient had a 6 mm right to left midline shift. There is no previous history of stroke nor TIA. Patient has not been on antiplatelet therapy. NIH stroke score was 20. 70 F with ICH and elavated ICP    Subjective Data  Subjective: "I feel good today."   Cognition  Cognition Arousal/Alertness: Lethargic Behavior During Therapy: WFL for tasks assessed/performed Overall Cognitive Status: Impaired/Different from baseline Area of Impairment: Attention;Following commands;Safety/judgement;Problem solving Orientation Level: Place;Time;Situation Current Attention Level: Sustained Memory: Decreased recall of precautions;Decreased short-term memory Following Commands: Follows one step commands inconsistently;Follows one step commands with increased time Safety/Judgement: Decreased awareness of safety;Decreased awareness of deficits Awareness: Intellectual Problem Solving: Slow processing;Decreased  initiation;Difficulty sequencing;Requires verbal cues;Requires tactile cues General Comments: Pt continues to have slow processing of commands.   Difficult to assess due to: Level of arousal    Balance  Balance Balance Assessed: Yes Static Sitting Balance Static Sitting - Balance Support: Bilateral upper extremity supported;Feet supported Static Sitting - Level of Assistance: 4: Min assist;3: Mod assist;2: Max assist Static Sitting - Comment/# of Minutes: Continues to vary from max to min guard assisst with tendency to have posterior lean.  Sat 5 min.  Level of arousal continues to decr when pt sits EOB.    End of Session PT - End of Session Equipment Utilized During Treatment: Gait belt Activity Tolerance: Patient limited by fatigue (limited by BP issues today) Patient left: in bed;with call bell/phone within reach;with bed alarm set;with family/visitor present;with nursing/sitter in room Nurse Communication: Mobility status        INGOLD,Esmae Donathan 07/15/2013, 11:48 AM [facility] [phone] (pager)

## 2013-07-15 NOTE — Progress Notes (Signed)
Patient ID: Alison Ward, female   DOB: [birth year], 51 y.o.   MRN: [id number] Subjective:  The patient is alert and pleasant. She has no complaints. I spoke with the patient's daughter.  Objective: Vital signs in last 24 hours: Temp:  [98 F (36.7 C)-99.5 F (37.5 C)] 98.7 F (37.1 C) (09/16 0406) Pulse Rate:  [73-95] 73 (09/16 0445) Resp:  [10-28] 16 (09/16 0500) BP: (118-143)/(77-99) 123/83 mmHg (09/16 0000) SpO2:  [92 %-100 %] 96 % (09/16 0500) Weight:  [107 kg (235 lb 14.3 oz)] 107 kg (235 lb 14.3 oz) (09/16 0445)  Intake/Output from previous day: 09/15 0701 - 09/16 0700 In: 640 [P.O.:640] Out: 237 [Drains:237] Intake/Output this shift: Total I/O In: 240 [P.O.:240] Out: 102 [Drains:102]  Physical exam the patient is alert and oriented x3. Her speech and strength is normal.  The patient's ventriculostomy is patent and draining.  Lab Results:  Recent Labs  07/15/13 0419  WBC 7.0  HGB 13.3  HCT 40.6  PLT 206   BMET  Recent Labs  07/14/13 0410 07/15/13 0419  NA 134* 134*  K 3.0* 3.5  CL 89* 90*  CO2 34* 35*  GLUCOSE 105* 97  BUN 20 20  CREATININE 1.17* 1.28*  CALCIUM 9.3 9.5    Studies/Results: No results found.  Assessment/Plan: Right intracerebral hemorrhage, intraventricular hemorrhage, hydrocephalus: The patient's CSF is still quite bloody. I will try to raise the ventriculostomy tomorrow and plan to repeat her CAT scan on Thursday. If the CAT scan looks okay and the patient does well clinically we will try to remove them at colostomy. If she does not tolerate the raising of the ventriculostomy or her ventricles a large she may need a permanent shunt when her spinal fluid clears a bit.  LOS: 9 days     Clarkson Rosselli D 07/15/2013, 6:09 AM

## 2013-07-15 NOTE — Progress Notes (Addendum)
[name], BP 121/80. After PT got pt up to chair, f/u BP dropped to 61/40, then varied between SBP 70s to 60s. Pt alert during this time. Dr. Molli Knock paged and order received to 500 cc bolus of NS. At this time pt denied any dizziness or feelings of lightheaded. Pt then became diaphoretic and had a brief moment of not speaking and eyes raised upward. Pt then became conversant and oriented. BP began to increase to the 90s then 110s and pt was pivoted back to bed while 500cc bolus infusing. Pt now comfortable, oriented in bed with mother at bedside. Will continue to monitor.   Alison Ward  BP 120/73. Order received to stop bolus now and d/c BP meds until further notice.

## 2013-07-15 NOTE — Progress Notes (Signed)
Rehab admissions - Evaluated for possible admission.  Once patient is medically stable, will consider for inpatient rehab.  Noted ventric drain in place with bloody drainage.  I will follow progress for now.  Call me for questions.  #[phone]

## 2013-07-15 NOTE — Progress Notes (Signed)
eLink Physician-Brief Progress Note Patient Name: Alison Ward DOB: [birth year] MRN: [id number]  Date of Service  07/15/2013   HPI/Events of Note   No urine x 7 hrs, bladder scan 322, avoid foley if able   eICU Interventions  Repeat bladder scan at 5 pm   Intervention Category Intermediate Interventions: Oliguria - evaluation and management  Nelda Bucks. 07/15/2013, 3:20 PM

## 2013-07-15 NOTE — Progress Notes (Signed)
SLP Cancellation Note  Patient Details Name: Alison Ward MRN: [id number] DOB: October 05, 1961   Cancelled treatment:       Reason Eval/Treat Not Completed: Medical issues which prohibited therapy. Pt working with PT, having low BP. SLP will hold therapy today.    Azalyn Sliwa, Riley Nearing 07/15/2013, 11:01 AM

## 2013-07-15 NOTE — Progress Notes (Signed)
Stroke Team Progress Note  HISTORY Alison Ward is an 52 y.o. female with a history of hypertension and poor compliance with taking medication, presenting with confusion and headache as well as lethargy. Patient was confused and called family members to get directions home. A pedestrian called EMS. Emergency services subsequently located family members and brought her to the ED. Blood pressure on arrival in the emergency room was 202/98. CT scan of her head showed a 4.1 x 3.1 cm parietotemporal intracerebral infarction with surrounding edema and intraventricular extension of hemorrhage. Patient had a 6 mm right to left midline shift. There is no previous history of stroke nor TIA. Patient has not been on antiplatelet therapy. NIH stroke score was 20. Patient was not a TPA candidate secondary to ICH  She was admitted to the neuro ICU for further evaluation and treatment.  Patient initially had to undergo placement of right frontal ventriculostomy via a burr hole but continued to have decreased mental status and on 07/07/2013 had to undergo placement of left frontal ventriculostomy via a burr hole and removal of right ventriculostomy.   SUBJECTIVE Patient without new complaints. Mother at bedside. No headaches. Feels better than yesterday.  OBJECTIVE Most recent Vital Signs: Filed Vitals:   07/15/13 0500 07/15/13 0800 07/15/13 0816 07/15/13 0900  BP:  116/80  110/74  Pulse:  81  77  Temp:   98.1 F (36.7 C)   TempSrc:   Oral   Resp: 16 16  0  Height:      Weight:      SpO2: 96% 95%  96%   CBG (last 3)  No results found for this basename: GLUCAP,  in the last 72 hours  IV Fluid Intake:      MEDICATIONS  . atorvastatin  40 mg Oral q1800  . calcium carbonate  200 mg of elemental calcium Oral BID WC  . divalproex  500 mg Oral Q8H  . enoxaparin (LOVENOX) injection  40 mg Subcutaneous Q24H  . feeding supplement  237 mL Oral BID BM  . hydrochlorothiazide  25 mg Oral Daily  . lisinopril   5 mg Oral Daily  . pantoprazole  40 mg Oral Daily  . senna-docusate  1 tablet Oral BID   PRN:  acetaminophen, acetaminophen, labetalol, LORazepam, magnesium hydroxide, ondansetron, oxyCODONE  Diet:  General Activity: bedrest DVT Prophylaxis:  SCDs CLINICALLY SIGNIFICANT STUDIES Basic Metabolic Panel:   Recent Labs Lab 07/12/13 0500 07/14/13 0410 07/15/13 0419  NA 137 134* 134*  K 3.6 3.0* 3.5  CL 94* 89* 90*  CO2 36* 34* 35*  GLUCOSE 102* 105* 97  BUN 14 20 20   CREATININE 0.97 1.17* 1.28*  CALCIUM 9.5 9.3 9.5  MG 2.1  --  2.3  PHOS 3.8  --  3.9   Liver Function Tests:   Recent Labs Lab 07/11/13 0500  AST 16  ALT 7  ALKPHOS 52  BILITOT 0.5  PROT 7.1  ALBUMIN 3.0*   CBC:   Recent Labs Lab 07/12/13 0500 07/15/13 0419  WBC 6.5 7.0  HGB 12.8 13.3  HCT 39.7 40.6  MCV 85.9 84.6  PLT 190 206   Coagulation:  No results found for this basename: LABPROT, INR,  in the last 168 hours Cardiac Enzymes: No results found for this basename: CKTOTAL, CKMB, CKMBINDEX, TROPONINI,  in the last 168 hours Urinalysis:  No results found for this basename: COLORURINE, APPERANCEUR, LABSPEC, PHURINE, GLUCOSEU, HGBUR, BILIRUBINUR, KETONESUR, PROTEINUR, UROBILINOGEN, NITRITE, LEUKOCYTESUR,  in the last 168  hours Lipid Panel    Component Value Date/Time   CHOL 244* 07/06/2013 0420   TRIG 153* 07/06/2013 0420   HDL 45 07/06/2013 0420   CHOLHDL 5.4 07/06/2013 0420   VLDL 31 07/06/2013 0420   LDLCALC 168* 07/06/2013 0420   HgbA1C  Lab Results  Component Value Date   HGBA1C 6.0* 07/06/2013    Urine Drug Screen:   No results found for this basename: labopia,  cocainscrnur,  labbenz,  amphetmu,  thcu,  labbarb    Alcohol Level: No results found for this basename: ETH,  in the last 168 hours  Ct Angio Head W/cm &/or Wo Cm 07/07/2013    1.  Minimal distal small vessel disease. 2.  No significant vascular lesion associated with the area of hemorrhage. 3.  Stable right parietal parenchymal  hemorrhage. 4.  Status post right to the tracheostomy placement without evidence for hydrocephalus. 5.  The extent of intraventricular hemorrhage has increased.     CT Head  07/09/2013 Markedly limited exam secondary to motion. There appears to be new dependent left ventricular hemorrhage. Right ventricle hemorrhage and right parietal - posterior temporal hematoma once again noted.  07/06/2013 Large intracerebral hematoma at the posterior right parietotemporal lobe, 4.1 x 3.1 cm in size with surrounding edema and evidence of intraventricular extension of hemorrhage. Question minimal subarachnoid blood at high posterior right parieta region. 6 mm of right-to-left midline shift and diffuse sulcal effacement noted.  MRI of the brain   MRA of the brain    2D Echocardiogram  EF 65%, no regional wall motion abnormality identified, no cardiac source of emboli identified.  Carotid Doppler  Findings suggest 1-39% stenosis bilaterally. Vertebral arteries are patent with antegrade flow.  CXR  07/07/2013   1.  Interval placement of left IJ line, tip overlying the level of the superior vena cava - SVC confluence. 2.  No postprocedure pneumothorax. 3.  Cardiomegaly and vascular congestion.  07/07/2013  Increased pulmonary vascular congestion now with mild pulmonary edema.  Stable cardiomegaly.   07/06/13 Shallow inspiration. Mild cardiac enlargement. No evidence of active pulmonary disease.   EKG Sinus rhythmProbable LVH with secondary repol abnrm   Therapy Recommendations CIR  Physical Exam   GENERAL EXAM: Patient is in no distress  CARDIOVASCULAR: Regular rate and rhythm, no murmurs, no carotid bruits  NEUROLOGIC: MENTAL STATUS: awake, SLOW RESPONSES. CRANIAL NERVE: pupils equal and reactive to light, visual fields full to confrontation, extraocular muscles intact, no nystagmus, facial sensation and strength symmetric, uvula midline, shoulder shrug symmetric, tongue midline. MOTOR: LUE AND LLE 4, RIGHT  SIDE FULL STRENGTH SENSORY: SLIGHTLY DECR ON LEFT SIDE   ASSESSMENT Alison Ward is a 52 y.o. 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 Patient initially underwent placement of right frontal ventriculostomy, due to  continued decreased mental status had to undergo placement of left frontal ventriculostomy with removal of right ventriculostomy. She had intermittent development 07/09/2013 Of a new dependent left ventricular hemorrhage. IVC drainage continues.    Hyperlipidemia, new lipitor, LDL 168 goal < 100 for non diabetics.   Cytotoxic edema, 3% saline discontinued.  Malignant Hypertension. HCTZ home med resumed. Added Lisinopril 5mg  daily.  Hypokalemia, resolved  Induced hypernatremia in order to decrease cerebral edema with 3% saline, protocol d/c'd 9/12  Hospital day # 9  TREATMENT/PLAN  No change  in neuro care.  SBP goal < 180  Neurosurgeon following ventriculostomy - still with bloody drainage, plan to monitor a few more days - plan repeat CT on Thurs  CIR following  Annie Main, MSN, RN, ANVP-BC, ANP-BC, GNP-BC [facility] Pager: [phone] 07/15/2013 9:51 AM  I evaluated and examined patient, reviewed records, labs and imaging, and agree with note and plan.  Suanne Marker, MD 07/15/2013, 10:24 PM Certified in Neurology, Neurophysiology and Neuroimaging [facility] - Stroke Team  Please refer to [email or web address] for on-call Stroke MD

## 2013-07-15 NOTE — Progress Notes (Signed)
PULMONARY  / CRITICAL CARE MEDICINE  Name: Alison Ward MRN: [id number] DOB: [birth year]    ADMISSION DATE:  07/06/2013 CONSULTATION DATE:  07/06/2013  REFERRING MD :  Dr. Lavella Lemons PRIMARY SERVICE: CCM  CHIEF COMPLAINT:  AMS  BRIEF PATIENT DESCRIPTION: 53 F with ICH a r/t HTN and elevated ICP.  SIGNIFICANT EVENTS / STUDIES: ICH 07/06/2013 s/p ventric ventric removed and replaced 9/8 Lovell Sheehan) 3% saline started 9/8, stopped 9/10  CULTURES: None  ANTIBIOTICS: Cefazolin 9/7 >> 9/12   SUBJECTIVE:  Headache much improved.    VITAL SIGNS: Temp:  [98.1 F (36.7 C)-99.5 F (37.5 C)] 98.1 F (36.7 C) (09/16 0816) Pulse Rate:  [73-95] 80 (09/16 1000) Resp:  [10-28] 21 (09/16 1000) BP: (110-143)/(74-99) 121/80 mmHg (09/16 1000) SpO2:  [92 %-100 %] 100 % (09/16 1000) Weight:  [107 kg (235 lb 14.3 oz)] 107 kg (235 lb 14.3 oz) (09/16 0445)  HEMODYNAMICS:    VENTILATOR SETTINGS:    INTAKE / OUTPUT: Intake/Output     09/15 0701 - 09/16 0700 09/16 0701 - 09/17 0700   P.O. 640    Total Intake(mL/kg) 640 (6)    Drains 267 39   Total Output 267 39   Net +373 -39        Urine Occurrence 10 x      PHYSICAL EXAMINATION: General:  Obese F in NAD Neuro:  Awake and interacting, more alert, MAE, appropriate HEENT:  Sclera anicteric, pupil equal and reactive bilaterally, MMM, OP clear, L ventric  Neck:  Trachea supple and midline, (-) JVD, (-) LAN Cardiovascular:  RRR, NS1/S2, (-) MRG Lungs:  resps even non labored on [place], CTAB Abdomen:  S/NT/ND/(+)BS Musculoskeletal:  (-) C/C/E, OA toes biltaerally Skin:  Intact  LABS:  CBC Recent Labs     07/15/13  0419  WBC  7.0  HGB  13.3  HCT  40.6  PLT  206    Coag's No results found for this basename: APTT, INR,  in the last 72 hours  BMET Recent Labs     07/14/13  0410  07/15/13  0419  NA  134*  134*  K  3.0*  3.5  CL  89*  90*  CO2  34*  35*  BUN  20  20  CREATININE  1.17*  1.28*  GLUCOSE  105*  97     Electrolytes Recent Labs     07/14/13  0410  07/15/13  0419  CALCIUM  9.3  9.5  MG   --   2.3  PHOS   --   3.9    Sepsis Markers No results found for this basename: LACTICACIDVEN, PROCALCITON, O2SATVEN,  in the last 72 hours  ABG No results found for this basename: PHART, PCO2ART, PO2ART,  in the last 72 hours  Liver Enzymes No results found for this basename: AST, ALT, ALKPHOS, BILITOT, ALBUMIN,  in the last 72 hours  Cardiac Enzymes No results found for this basename: TROPONINI, PROBNP,  in the last 72 hours  Glucose No results found for this basename: GLUCAP,  in the last 72 hours  Imaging No results found.   CXR:  No new CXR 9/14   ASSESSMENT / PLAN: Principal Problem:   Intracerebral hemorrhage Active Problems:   HYPERTENSION   PULMONARY A:  No active issues P:   - She is protecting airway adequately and mental status improved.  CARDIOVASCULAR A:  HTN: Intrinsic + reactive. Goal SBP <= 140 P:  - Nicardipine Drip off 9/8. -  Cont hctz, lisinopril.  RENAL A: Hypokalemia  P:   - K-dur 40 meq PO x2 doses. - F/u BMP in AM.  GASTROINTESTINAL A: - Tolerating PO's well.  Continue diet.  HEMATOLOGIC A:   - No Acute Issues. - F/u CBC.  INFECTIOUS A: - Cefazolin 9/7 > 9/12 for ventric prophylaxis, now off.  ENDOCRINE A:   - No Acute Issues.  NEUROLOGIC A:  - Intracerebral Hemorrhage. - Intraventricular blood and enlargement s/p ventriculostomy 9/7 and replaced 9/8. - S/p 3% saline - stopped 9/10.  P:   - Cont Valproate. - Out of ICU when ventric able to be removed - still draining good amount of bloody CSF and NS would like to keep in place for a few more days. - F/u imaging per neurology's plans. - PRN pain rx for headache.  Alyson Reedy, M.D. [facility] Pulmonary/Critical Care Medicine. Pager: [phone]. After hours pager: [phone].

## 2013-07-15 NOTE — Progress Notes (Signed)
UR completed.   Reshard Guillet, RN BSN M[facility] CCM  [phone] 

## 2013-07-16 LAB — BASIC METABOLIC PANEL
CO2: 38 mEq/L — ABNORMAL HIGH (ref 19–32)
Chloride: 92 mEq/L — ABNORMAL LOW (ref 96–112)
Glucose, Bld: 103 mg/dL — ABNORMAL HIGH (ref 70–99)
Potassium: 3.7 mEq/L (ref 3.5–5.1)
Sodium: 137 mEq/L (ref 135–145)

## 2013-07-16 LAB — CBC
HCT: 39.6 % (ref 36.0–46.0)
Hemoglobin: 12.9 g/dL (ref 12.0–15.0)
MCH: 27.9 pg (ref 26.0–34.0)
MCV: 85.5 fL (ref 78.0–100.0)
Platelets: 188 10*3/uL (ref 150–400)
RBC: 4.63 MIL/uL (ref 3.87–5.11)
WBC: 6.5 10*3/uL (ref 4.0–10.5)

## 2013-07-16 LAB — PHOSPHORUS: Phosphorus: 3 mg/dL (ref 2.3–4.6)

## 2013-07-16 MED ORDER — CEFAZOLIN SODIUM-DEXTROSE 2-3 GM-% IV SOLR
2.0000 g | Freq: Three times a day (TID) | INTRAVENOUS | Status: DC
  Administered 2013-07-16 – 2013-07-21 (×17): 2 g via INTRAVENOUS
  Filled 2013-07-16 (×19): qty 50

## 2013-07-16 MED ORDER — POTASSIUM CHLORIDE CRYS ER 20 MEQ PO TBCR
40.0000 meq | EXTENDED_RELEASE_TABLET | Freq: Three times a day (TID) | ORAL | Status: AC
  Administered 2013-07-16 (×2): 40 meq via ORAL
  Filled 2013-07-16 (×3): qty 2

## 2013-07-16 MED ORDER — POTASSIUM CHLORIDE CRYS ER 20 MEQ PO TBCR
40.0000 meq | EXTENDED_RELEASE_TABLET | Freq: Once | ORAL | Status: DC
  Filled 2013-07-16: qty 2

## 2013-07-16 NOTE — Progress Notes (Signed)
PULMONARY  / CRITICAL CARE MEDICINE  Name: Alison Ward MRN: [id number] DOB: [birth year]    ADMISSION DATE:  07/06/2013 CONSULTATION DATE:  07/06/2013  REFERRING MD :  Dr. Lavella Lemons PRIMARY SERVICE: CCM  CHIEF COMPLAINT:  AMS  BRIEF PATIENT DESCRIPTION: 9 F with ICH a r/t HTN and elevated ICP.  SIGNIFICANT EVENTS / STUDIES: ICH 07/06/2013 s/p ventric ventric removed and replaced 9/8 Lovell Sheehan) 3% saline started 9/8, stopped 9/10  CULTURES: None  ANTIBIOTICS: Cefazolin 9/7 >> 9/12   SUBJECTIVE:  Headache much improved.    VITAL SIGNS: Temp:  [97.5 F (36.4 C)-99.6 F (37.6 C)] 99.6 F (37.6 C) (09/17 0813) Pulse Rate:  [72-94] 77 (09/17 1100) Resp:  [9-22] 14 (09/17 1100) BP: (116-142)/(69-97) 125/80 mmHg (09/17 1100) SpO2:  [88 %-100 %] 99 % (09/17 1100) Weight:  [109.4 kg (241 lb 2.9 oz)] 109.4 kg (241 lb 2.9 oz) (09/17 0500)  HEMODYNAMICS:    VENTILATOR SETTINGS:    INTAKE / OUTPUT: Intake/Output     09/16 0701 - 09/17 0700 09/17 0701 - 09/18 0700   P.O. 957 240   IV Piggyback 250 50   Total Intake(mL/kg) 1207 (11) 290 (2.7)   Urine (mL/kg/hr) 4 (0)    Drains 278 (0.1) 48 (0.1)   Stool 2 (0)    Total Output 284 48   Net +923 +242        Urine Occurrence  1 x     PHYSICAL EXAMINATION: General:  Obese F in NAD Neuro:  Awake and interacting, more alert, MAE, appropriate HEENT:  Sclera anicteric, pupil equal and reactive bilaterally, MMM, OP clear, L ventric  Neck:  Trachea supple and midline, (-) JVD, (-) LAN Cardiovascular:  RRR, NS1/S2, (-) MRG Lungs:  resps even non labored on [place], CTAB Abdomen:  S/NT/ND/(+)BS Musculoskeletal:  (-) C/C/E, OA toes biltaerally Skin:  Intact  LABS:  CBC Recent Labs     07/15/13  0419  07/16/13  0410  WBC  7.0  6.5  HGB  13.3  12.9  HCT  40.6  39.6  PLT  206  188   Coag's No results found for this basename: APTT, INR,  in the last 72 hours  BMET Recent Labs     07/14/13  0410  07/15/13  0419  07/16/13  0410  NA  134*  134*  137  K  3.0*  3.5  3.7  CL  89*  90*  92*  CO2  34*  35*  38*  BUN  20  20  19   CREATININE  1.17*  1.28*  1.30*  GLUCOSE  105*  97  103*   Electrolytes Recent Labs     07/14/13  0410  07/15/13  0419  07/16/13  0410  CALCIUM  9.3  9.5  9.9  MG   --   2.3  2.4  PHOS   --   3.9  3.0   Sepsis Markers No results found for this basename: LACTICACIDVEN, PROCALCITON, O2SATVEN,  in the last 72 hours  ABG No results found for this basename: PHART, PCO2ART, PO2ART,  in the last 72 hours  Liver Enzymes No results found for this basename: AST, ALT, ALKPHOS, BILITOT, ALBUMIN,  in the last 72 hours  Cardiac Enzymes No results found for this basename: TROPONINI, PROBNP,  in the last 72 hours  Glucose No results found for this basename: GLUCAP,  in the last 72 hours  Imaging No results found.   CXR:  No new  CXR 9/14   ASSESSMENT / PLAN: Principal Problem:   Intracerebral hemorrhage Active Problems:   HYPERTENSION   PULMONARY A:  No active issues P:   - She is protecting airway adequately and mental status improved.  CARDIOVASCULAR A:  HTN: Intrinsic + reactive. Goal SBP <= 140 P:  - Nicardipine Drip off 9/8. - Cont hctz, lisinopril.  RENAL A: Hypokalemia  P:   - K-dur 40 meq PO x2 doses. - F/u BMP in AM.  GASTROINTESTINAL A: - Tolerating PO's well.  Continue diet.  HEMATOLOGIC A:   - No Acute Issues. - F/u CBC.  INFECTIOUS A: - Cefazolin 9/7 > 9/12 for ventric prophylaxis, now off.  ENDOCRINE A:   - No Acute Issues.  NEUROLOGIC A:  - Intracerebral Hemorrhage. - Intraventricular blood and enlargement s/p ventriculostomy 9/7 and replaced 9/8. - S/p 3% saline - stopped 9/10.  P:   - Cont Valproate. - Out of ICU when ventric able to be removed - still draining good amount of bloody CSF and NS would like to keep in place for a few more days. - F/u imaging per neurology's plans. - PRN pain rx for headache.  Alyson Reedy, M.D. [facility] Pulmonary/Critical Care Medicine. Pager: [phone]. After hours pager: [phone].

## 2013-07-16 NOTE — Progress Notes (Signed)
Stroke Team Progress Note  HISTORY Alison Ward is an 52 y.o. female with a history of hypertension and poor compliance with taking medication, presenting with confusion and headache as well as lethargy. Patient was confused and called family members to get directions home. A pedestrian called EMS. Emergency services subsequently located family members and brought her to the ED. Blood pressure on arrival in the emergency room was 202/98. CT scan of her head showed a 4.1 x 3.1 cm parietotemporal intracerebral infarction with surrounding edema and intraventricular extension of hemorrhage. Patient had a 6 mm right to left midline shift. There is no previous history of stroke nor TIA. Patient has not been on antiplatelet therapy. NIH stroke score was 20. Patient was not a TPA candidate secondary to ICH  She was admitted to the neuro ICU for further evaluation and treatment.  Patient initially had to undergo placement of right frontal ventriculostomy via a burr hole but continued to have decreased mental status and on 07/07/2013 had to undergo placement of left frontal ventriculostomy via a burr hole and removal of right ventriculostomy.   SUBJECTIVE Patient awake. Remembering more about the day she got sick. Family member in the room to validate the story  OBJECTIVE Most recent Vital Signs: Filed Vitals:   07/16/13 0700 07/16/13 0800 07/16/13 0813 07/16/13 0900  BP: 129/81 128/83  136/97  Pulse: 81 80  83  Temp:   99.6 F (37.6 C)   TempSrc:   Oral   Resp: 9   14  Height:      Weight:      SpO2: 99% 95%  99%   CBG (last 3)  No results found for this basename: GLUCAP,  in the last 72 hours  IV Fluid Intake:      MEDICATIONS  . atorvastatin  40 mg Oral q1800  . calcium carbonate  200 mg of elemental calcium Oral BID WC  .  ceFAZolin (ANCEF) IV  2 g Intravenous Q8H  . divalproex  500 mg Oral Q8H  . enoxaparin (LOVENOX) injection  40 mg Subcutaneous Q24H  . feeding supplement  237 mL  Oral BID BM  . pantoprazole  40 mg Oral Daily  . senna-docusate  1 tablet Oral BID   PRN:  acetaminophen, acetaminophen, labetalol, LORazepam, magnesium hydroxide, ondansetron, oxyCODONE  Diet:  General Activity: bedrest DVT Prophylaxis:  SCDs CLINICALLY SIGNIFICANT STUDIES Basic Metabolic Panel:   Recent Labs Lab 07/15/13 0419 07/16/13 0410  NA 134* 137  K 3.5 3.7  CL 90* 92*  CO2 35* 38*  GLUCOSE 97 103*  BUN 20 19  CREATININE 1.28* 1.30*  CALCIUM 9.5 9.9  MG 2.3 2.4  PHOS 3.9 3.0   Liver Function Tests:   Recent Labs Lab 07/11/13 0500  AST 16  ALT 7  ALKPHOS 52  BILITOT 0.5  PROT 7.1  ALBUMIN 3.0*   CBC:   Recent Labs Lab 07/15/13 0419 07/16/13 0410  WBC 7.0 6.5  HGB 13.3 12.9  HCT 40.6 39.6  MCV 84.6 85.5  PLT 206 188   Coagulation:  No results found for this basename: LABPROT, INR,  in the last 168 hours Cardiac Enzymes: No results found for this basename: CKTOTAL, CKMB, CKMBINDEX, TROPONINI,  in the last 168 hours Urinalysis:  No results found for this basename: COLORURINE, APPERANCEUR, LABSPEC, PHURINE, GLUCOSEU, HGBUR, BILIRUBINUR, KETONESUR, PROTEINUR, UROBILINOGEN, NITRITE, LEUKOCYTESUR,  in the last 168 hours Lipid Panel    Component Value Date/Time   CHOL 244* 07/06/2013 0420  TRIG 153* 07/06/2013 0420   HDL 45 07/06/2013 0420   CHOLHDL 5.4 07/06/2013 0420   VLDL 31 07/06/2013 0420   LDLCALC 168* 07/06/2013 0420   HgbA1C  Lab Results  Component Value Date   HGBA1C 6.0* 07/06/2013    Urine Drug Screen:   No results found for this basename: labopia,  cocainscrnur,  labbenz,  amphetmu,  thcu,  labbarb    Alcohol Level: No results found for this basename: ETH,  in the last 168 hours  Ct Angio Head W/cm &/or Wo Cm 07/07/2013    1.  Minimal distal small vessel disease. 2.  No significant vascular lesion associated with the area of hemorrhage. 3.  Stable right parietal parenchymal hemorrhage. 4.  Status post right to the tracheostomy placement  without evidence for hydrocephalus. 5.  The extent of intraventricular hemorrhage has increased.     CT Head  07/09/2013 Markedly limited exam secondary to motion. There appears to be new dependent left ventricular hemorrhage. Right ventricle hemorrhage and right parietal - posterior temporal hematoma once again noted.  07/06/2013 Large intracerebral hematoma at the posterior right parietotemporal lobe, 4.1 x 3.1 cm in size with surrounding edema and evidence of intraventricular extension of hemorrhage. Question minimal subarachnoid blood at high posterior right parieta region. 6 mm of right-to-left midline shift and diffuse sulcal effacement noted.  MRI of the brain   MRA of the brain    2D Echocardiogram  EF 65%, no regional wall motion abnormality identified, no cardiac source of emboli identified.  Carotid Doppler  Findings suggest 1-39% stenosis bilaterally. Vertebral arteries are patent with antegrade flow.  CXR  07/07/2013   1.  Interval placement of left IJ line, tip overlying the level of the superior vena cava - SVC confluence. 2.  No postprocedure pneumothorax. 3.  Cardiomegaly and vascular congestion.  07/07/2013  Increased pulmonary vascular congestion now with mild pulmonary edema.  Stable cardiomegaly.   07/06/13 Shallow inspiration. Mild cardiac enlargement. No evidence of active pulmonary disease.   EKG Sinus rhythmProbable LVH with secondary repol abnrm   Therapy Recommendations CIR  Physical Exam   GENERAL EXAM: Patient is in no distress  CARDIOVASCULAR: Regular rate and rhythm, no murmurs, no carotid bruits  NEUROLOGIC: MENTAL STATUS: awake, SLOW RESPONSES. CRANIAL NERVE: pupils equal and reactive to light, visual fields full to confrontation, extraocular muscles intact, no nystagmus, facial sensation and strength symmetric, uvula midline, shoulder shrug symmetric, tongue midline. MOTOR: LUE AND LLE 4, RIGHT SIDE FULL STRENGTH SENSORY: SLIGHTLY DECR ON LEFT  SIDE   ASSESSMENT [name] is a 52 y.o. 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 Patient initially underwent placement of right frontal ventriculostomy, due to  continued decreased mental status had to undergo placement of left frontal ventriculostomy with removal of right ventriculostomy. She had intermittent development 07/09/2013 Of a new dependent left ventricular hemorrhage. IVC drainage continues.    Hyperlipidemia, new lipitor, LDL 168 goal < 100 for non diabetics.   Cytotoxic edema, 3% saline discontinued.  Malignant Hypertension. HCTZ home med resumed. Added Lisinopril 5mg  daily.  Hypokalemia, resolved  Induced hypernatremia in order to decrease cerebral edema with 3% saline, protocol d/c'd 9/12  Hospital day # 10  TREATMENT/PLAN  No change in neuro care.  SBP goal < 180  Neurosurgeon following ventriculostomy - still with bloody drainage,  plan to monitor a few more days - plan repeat CT on Thurs  CIR following  Gwendolyn Lima. Manson Passey, [facility], MBA, MHA [facility] Pager: [phone] 07/16/2013 9:23 AM   I have personally obtained a history, examined the patient, evaluated imaging results, and formulated the assessment and plan of care. I agree with the above.  Delia Heady, MD

## 2013-07-16 NOTE — Progress Notes (Signed)
NUTRITION FOLLOW UP  Intervention:   Ensure Complete po BID, each supplement provides 350 kcal and 13 grams of protein.  Nutrition Dx:   Inadequate oral intake related to decreased appetite/cognition as evidenced by Meal Completion: <50%; progressing.    Goal:  Pt to meet >/= 90% of their estimated nutrition needs, progressing.     Monitor:  PO intake, weight trend, labs, supplement acceptance  Assessment:   Pt admitted with ICH s/p ventric. Pt remains in ICU with ventric in place. Per mom intake has been better today.   Height: Ht Readings from Last 1 Encounters:  07/06/13 5\' 6"  (1.676 m)    Weight Status:   Wt Readings from Last 1 Encounters:  07/16/13 241 lb 2.9 oz (109.4 kg)    Re-estimated needs:  Kcal: 1900-2100  Protein: 90-110 grams  Fluid: > 2 L/day  Skin: head incisions  Diet Order: General Meal Completion: 30-90%   Intake/Output Summary (Last 24 hours) at 07/16/13 1457 Last data filed at 07/16/13 1400  Gross per 24 hour  Intake   1367 ml  Output    291 ml  Net   1076 ml    Last BM: 9/16   Labs:   Recent Labs Lab 07/12/13 0500 07/14/13 0410 07/15/13 0419 07/16/13 0410  NA 137 134* 134* 137  K 3.6 3.0* 3.5 3.7  CL 94* 89* 90* 92*  CO2 36* 34* 35* 38*  BUN 14 20 20 19   CREATININE 0.97 1.17* 1.28* 1.30*  CALCIUM 9.5 9.3 9.5 9.9  MG 2.1  --  2.3 2.4  PHOS 3.8  --  3.9 3.0  GLUCOSE 102* 105* 97 103*    CBG (last 3)  No results found for this basename: GLUCAP,  in the last 72 hours  Scheduled Meds: . atorvastatin  40 mg Oral q1800  . calcium carbonate  200 mg of elemental calcium Oral BID WC  .  ceFAZolin (ANCEF) IV  2 g Intravenous Q8H  . divalproex  500 mg Oral Q8H  . enoxaparin (LOVENOX) injection  40 mg Subcutaneous Q24H  . feeding supplement  237 mL Oral BID BM  . pantoprazole  40 mg Oral Daily  . potassium chloride  40 mEq Oral TID  . senna-docusate  1 tablet Oral BID    Continuous Infusions:   Kendell Bane RD, LDN,  CNSC [phone] Pager [phone] After Hours Pager

## 2013-07-16 NOTE — PMR Pre-admission (Signed)
PMR Admission Coordinator Pre-Admission Assessment  Patient: Alison Ward is an 52 y.o., female MRN: [id number] DOB: 1961/04/02 Height: 5\' 6"  (167.6 cm) Weight: 109.3 kg (240 lb 15.4 oz)              Insurance Information Self pay  Medicaid Application Date:  pending      Case Manager:   Disability Application Date: pending      Case Worker:    Emergency Conservator, museum/gallery Information   Name Relation Home Work Mobile   Delcarlo,Joy Daughter [phone]       Current Medical History  Patient Admitting Diagnosis: Right temporal-parietal ICH   History of Present Illness: A 52 y.o. right-handed African American female with history of hypertension and poor medical compliance. Admitted 07/06/2013 with headache as well as lethargy. Blood pressure 202/98 point admission through the emergency room. 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 Underwent placement of right frontal ventriculostomy via burr hole 07/06/2013 per Dr. Lovell Sheehan. Carotid Dopplers with no ICA stenosis. Echocardiogram with ejection fraction of 70% and grade 1 diastolic dysfunction. Patient did not receive TPA secondary to ICH. Placed on Cardene drip for blood pressure control. Followup neurosurgery with findings of occluded ventriculostomy vessel underwent placement of left ventriculostomy and removal of right ventriculostomy 07/07/2013.  L ventriculostomy discontinued on 07/18/13 and patient on neuro floor now.  Maintained on valproate for seizure prophylaxis. Patient is presently maintained on a regular diet. Physical and occupational therapy evaluations completed with recommendations for physical medicine rehabilitation consult to consider inpatient rehabilitation services.     Total: 3=NIH  Past Medical History  Past Medical History  Diagnosis Date   . Hypertension     Family History  family history includes Diabetes in her daughter; Hypertension in her father and mother.  Prior Rehab/Hospitalizations:  No previous rehab.   Current Medications  Current facility-administered medications:acetaminophen (TYLENOL) suppository 650 mg, 650 mg, Rectal, Q4H PRN, Jenelle Mages, MD, 650 mg at 07/06/13 1437;  acetaminophen (TYLENOL) tablet 650 mg, 650 mg, Oral, Q4H PRN, Jenelle Mages, MD, 650 mg at 07/21/13 0221;  atorvastatin (LIPITOR) tablet 40 mg, 40 mg, Oral, q1800, Cathlyn Parsons, PA-C, 40 mg at 07/20/13 1700 calcium carbonate (TUMS - dosed in mg elemental calcium) chewable tablet 200 mg of elemental calcium, 200 mg of elemental calcium, Oral, BID WC, Maeola Harman, MD, 200 mg of elemental calcium at 07/21/13 0800;  ceFAZolin (ANCEF) IVPB 2 g/50 mL premix, 2 g, Intravenous, Q8H, Cristi Loron, MD, 2 g at 07/21/13 0125;  divalproex (DEPAKOTE ER) 24 hr tablet 1,000 mg, 1,000 mg, Oral, Daily, Cathlyn Parsons, PA-C, 1,000 mg at 07/20/13 1014 enoxaparin (LOVENOX) injection 40 mg, 40 mg, Subcutaneous, Q24H, Layne Benton, NP, 40 mg at 07/20/13 1700;  feeding supplement (ENSURE COMPLETE) liquid 237 mL, 237 mL, Oral, BID BM, Heather Cornelison Pitts, RD, 237 mL at 07/20/13 1500;  hydrochlorothiazide (MICROZIDE) capsule 12.5 mg, 12.5 mg, Oral, Daily, Lonia Blood, MD, 12.5 mg at 07/20/13 1539 labetalol (NORMODYNE,TRANDATE) injection 10-20 mg, 10-20 mg, Intravenous, Q4H PRN, Leslye Peer, MD, 10 mg at 07/11/13 0848;  lisinopril (PRINIVIL,ZESTRIL) tablet 20 mg, 20 mg, Oral, Daily, Lonia Blood, MD, 20 mg at 07/20/13 1538;  magnesium hydroxide (MILK OF MAGNESIA) suspension 30 mL, 30 mL, Oral, Daily PRN, Alyson Reedy, MD, 30 mL  at 07/15/13 0956 pantoprazole (PROTONIX) EC tablet 40 mg, 40 mg, Oral, Daily, Nelda Bucks, MD, 40 mg at 07/20/13 1015;  potassium chloride SA (K-DUR,KLOR-CON) CR tablet 40 mEq, 40 mEq, Oral, Once, Nelda Bucks,  MD;  potassium chloride SA (K-DUR,KLOR-CON) CR tablet 40 mEq, 40 mEq, Oral, Once, Zigmund Gottron, MD;  senna-docusate (Senokot-S) tablet 1 tablet, 1 tablet, Oral, BID, Jenelle Mages, MD, 1 tablet at 07/20/13 2131  Patients Current Diet: General  Precautions / Restrictions Precautions Precautions: Fall Precaution Comments: ventricular shunt  Restrictions Weight Bearing Restrictions: No   Prior Activity Level Community (5-7x/wk): Went out daily.  Was working with a friend.  Home Assistive Devices / Equipment Home Assistive Devices/Equipment: Eyeglasses Home Equipment: None  Prior Functional Level Prior Function Level of Independence: Independent Comments: tub with curtain and standard toilet  Current Functional Level Cognition  Arousal/Alertness: Awake/alert Overall Cognitive Status: Impaired/Different from baseline Difficult to assess due to: Level of arousal Current Attention Level: Sustained Orientation Level: Oriented to person;Oriented to place;Oriented to situation;Disoriented to time Following Commands: Follows one step commands inconsistently;Follows one step commands with increased time Safety/Judgement: Decreased awareness of safety;Decreased awareness of deficits General Comments: Pt continues to have slow processing of commands.   Attention: Sustained Sustained Attention: Impaired Sustained Attention Impairment: Verbal basic;Functional basic Awareness: Impaired Awareness Impairment: Intellectual impairment Problem Solving: Impaired Problem Solving Impairment: Verbal basic;Functional basic    Extremity Assessment (includes Sensation/Coordination)          ADLs  Eating/Feeding: NPO Where Assessed - Eating/Feeding: Edge of bed Grooming: Moderate assistance Where Assessed - Grooming: Unsupported sitting Upper Body Bathing: Maximal assistance Where Assessed - Upper Body Bathing: Supported sitting Lower Body Bathing: +1 Total assistance Where  Assessed - Lower Body Bathing: Supported sit to stand Upper Body Dressing: +1 Total assistance Where Assessed - Upper Body Dressing: Unsupported sitting Lower Body Dressing: +1 Total assistance Where Assessed - Lower Body Dressing: Supported sit to Pharmacist, hospital: +2 Total assistance Toilet Transfer: Patient Percentage: 60% Statistician Method: Surveyor, minerals:  (Bed to recliner on her left) Transfers/Ambulation Related to ADLs: total A +2 (pt=60%) sit>stand; (30%) stand >sit ADL Comments: Worked on trying to assess vison more while she was seated EOB.  Pt would track to the right and the close her eyes; could not get her to track to the left. Pt reports that she feels dizzy (PT thinks she has some vestibular issues)    Mobility  Bed Mobility: Supine to Sit;Sitting - Scoot to [place] of Bed Rolling Left: Not tested (comment) Rolling Left: Patient Percentage: 50% Left Sidelying to Sit: 1: +2 Total assist;HOB elevated Left Sidelying to Sit: Patient Percentage: 50% Supine to Sit: 3: Mod assist;HOB elevated Supine to Sit: Patient Percentage: 10% Sitting - Scoot to Edge of Bed: 3: Mod assist Sitting - Scoot to Edge of Bed: Patient Percentage: 10% Sit to Supine: 1: +2 Total assist Sit to Supine: Patient Percentage: 50% Scooting to HOB: Not tested (comment) Scooting to [facility]: Patient Percentage: 0%    Transfers  Transfers: Sit to Stand;Stand to Sit;Stand Pivot Transfers Sit to Stand: 1: +2 Total assist;With upper extremity assist;From bed Sit to Stand: Patient Percentage: 70% Stand to Sit: 1: +2 Total assist;Without upper extremity assist;To chair/3-in-1 Stand to Sit: Patient Percentage: 70% Stand Pivot Transfers: 1: +2 Total assist Stand Pivot Transfers: Patient Percentage: 70% Lateral/Scoot Transfers: Not tested (comment) Lateral Transfers: Patient Percentage: 50%    Ambulation / Gait / Stairs /  Wheelchair Mobility  Ambulation/Gait Ambulation/Gait  Assistance: 1: +2 Total assist Ambulation/Gait: Patient Percentage: 70% Ambulation Distance (Feet): 3 Feet Assistive device: Rolling walker Ambulation/Gait Assistance Details: Pt had a lot of difficulty following commands with stepping with RW.  Pt scissoring while trying to step around.   Gait Pattern: Step-to pattern;Decreased stride length;Ataxic;Scissoring;Wide base of support Stairs: No Wheelchair Mobility Wheelchair Mobility: No    Posture / Balance Static Sitting Balance Static Sitting - Balance Support: Bilateral upper extremity supported;Feet supported Static Sitting - Level of Assistance: 4: Min assist;3: Mod assist Static Sitting - Comment/# of Minutes: sat 3 min EOB.  Not losing balance in all directions as she usually does and overall was more alert.      Special needs/care consideration BiPAP/CPAP No CPM No Continuous Drip IV No Dialysis No        Life Vest No Oxygen No Special Bed No Trach Size No Wound Vac (area) No    Skin Currently has a ventric drain in left scalp                             Bowel mgmt: Had BM 07/15/13 Bladder mgmt: Voiding  On bedpan and has a diaper. Diabetic mgmt No    Previous Home Environment Living Arrangements: Alone Available Help at Discharge: Family;Available 24 hours/day Type of Home: Apartment Home Layout: One level Home Access: Stairs to enter Entrance Stairs-Rails:  (see above) Entrance Stairs-Number of Steps: 3 down with right rail and then 6 with both rails Home Care Services: Other (Comment) Additional Comments: administrative job at work  Discharge Living Setting Plans for Discharge Living Setting: House;Lives with (comment) (Mom's house is 1 level with 1 step entry) Type of Home at Discharge: House Discharge Home Layout: One level Discharge Home Access: Stairs to enter Entrance Stairs-Number of Steps: 1 Does the patient have any problems obtaining your medications?: No  Social/Family/Support Systems Patient  Roles: Parent (Has 2 Dtrs and 1 son.) Contact Information: Donyelle Enyeart - daughter Anticipated Caregiver: Dtr Ander Slade and patient's mother Anticipated Industrial/product designer Information: Ander Slade - dtr [phone] Ability/Limitations of Caregiver: Dtr not working.  She recently quit her job to care for a 1 yr old.  Mom is not working and can assist. Caregiver Availability: 24/7 Discharge Plan Discussed with Primary Caregiver: Yes Is Caregiver In Agreement with Plan?: Yes Does Caregiver/Family have Issues with Lodging/Transportation while Pt is in Rehab?: No  Goals/Additional Needs Patient/Family Goal for Rehab: PT/OT/ST mod I/S goals Expected length of stay: 2 weeks Cultural Considerations: Christian Dietary Needs: Regular, thin liquids Equipment Needs: TBD Additional Information: If patient went home with Dtr, apt has 3 steps down and then 8-10 steps up to 2nd level apt. Pt/Family Agrees to Admission and willing to participate: Yes Program Orientation Provided & Reviewed with Pt/Caregiver Including Roles  & Responsibilities: Yes   Decrease burden of Care through IP rehab admission: N/A  Possible need for SNF placement upon discharge: Not likely  Patient Condition: This patient's medical and functional status has changed since the consult dated: 07/11/13 in which the Rehabilitation Physician determined and documented that the patient's condition is appropriate for intensive rehabilitative care in an inpatient rehabilitation facility. See "History of Present Illness" (above) for medical update. Functional changes are: Currently total assist +2 70% amb 3". Patient's medical and functional status update has been discussed with the Rehabilitation physician and patient remains appropriate for inpatient rehabilitation. Will admit to inpatient rehab today.  Preadmission Screen Completed By:  Trish Mage, 07/21/2013 9:10 AM ______________________________________________________________________    Discussed status with Dr. Riley Kill on 07/21/13 at 0932 and received telephone approval for admission today.  Admission Coordinator:  Trish Mage, time0932/Date09/22/14

## 2013-07-16 NOTE — Progress Notes (Signed)
Patient ID: Alison Ward, female   DOB: [birth year], 52 y.o.   MRN: [id number] Subjective:  The patient is alert and pleasant. She has no complaints. I spoke with her daughter.  Objective: Vital signs in last 24 hours: Temp:  [97.5 F (36.4 C)-99.6 F (37.6 C)] 99.6 F (37.6 C) (09/17 0813) Pulse Rate:  [68-94] 80 (09/17 0800) Resp:  [8-22] 9 (09/17 0700) BP: (61-142)/(38-96) 128/83 mmHg (09/17 0800) SpO2:  [88 %-100 %] 95 % (09/17 0800) Weight:  [109.4 kg (241 lb 2.9 oz)] 109.4 kg (241 lb 2.9 oz) (09/17 0500)  Intake/Output from previous day: 09/16 0701 - 09/17 0700 In: 1207 [P.O.:957; IV Piggyback:250] Out: 284 [Urine:4; Drains:278; Stool:2] Intake/Output this shift: Total I/O In: -  Out: 19 [Drains:19]  Physical exam the patient is alert and oriented x3. She is moving all 4 extremities well. Her speech is normal.  Her ventriculostomy is patent and draining.  Lab Results:  Recent Labs  07/15/13 0419 07/16/13 0410  WBC 7.0 6.5  HGB 13.3 12.9  HCT 40.6 39.6  PLT 206 188   BMET  Recent Labs  07/15/13 0419 07/16/13 0410  NA 134* 137  K 3.5 3.7  CL 90* 92*  CO2 35* 38*  GLUCOSE 97 103*  BUN 20 19  CREATININE 1.28* 1.30*  CALCIUM 9.5 9.9    Studies/Results: No results found.  Assessment/Plan: Right intracerebral hemorrhage, intraventricular hemorrhage, hydrocephalus: I have discussed the situation with the patient, her daughter and Dr. Pearlean Brownie. We will try to raise her ventriculostomy to 20 cm. I'll plan to monitor her clinical status and repeat her CAT scan tomorrow. If the patient's ventriculostomy doesn't drain much, she remains clinically stable, and her CAT scan does not demonstrate ventriculomegaly we will plan to remove her ventriculostomy.  LOS: 10 days     Ranon Coven D 07/16/2013, 8:37 AM

## 2013-07-16 NOTE — Progress Notes (Signed)
Rehab admissions - I met with patient and her daughter.  Plans are for inpatient rehab once patient is medically ready.  After rehab, patient plans to go home with either daughter or her mother.  I gave family booklets about inpatient rehab and answered questions.  Call me for questions.  #[phone]

## 2013-07-17 ENCOUNTER — Inpatient Hospital Stay (HOSPITAL_COMMUNITY): Payer: MEDICAID

## 2013-07-17 ENCOUNTER — Encounter (HOSPITAL_COMMUNITY): Payer: Self-pay | Admitting: Radiology

## 2013-07-17 LAB — CBC
HCT: 38.3 % (ref 36.0–46.0)
Hemoglobin: 12.6 g/dL (ref 12.0–15.0)
MCHC: 32.9 g/dL (ref 30.0–36.0)
MCV: 85.5 fL (ref 78.0–100.0)
RDW: 15.9 % — ABNORMAL HIGH (ref 11.5–15.5)

## 2013-07-17 LAB — BASIC METABOLIC PANEL
BUN: 19 mg/dL (ref 6–23)
Chloride: 93 mEq/L — ABNORMAL LOW (ref 96–112)
Creatinine, Ser: 1.05 mg/dL (ref 0.50–1.10)
GFR calc Af Amer: 68 mL/min — ABNORMAL LOW (ref >90)
GFR calc non Af Amer: 59 mL/min — ABNORMAL LOW (ref >90)
Glucose, Bld: 112 mg/dL — ABNORMAL HIGH (ref 70–99)
Potassium: 4.1 mEq/L (ref 3.5–5.1)

## 2013-07-17 NOTE — Progress Notes (Signed)
Patient ID: Alison Ward, female   DOB: [birth year], 52 y.o.   MRN: [id number] Subjective:  The patient is alert and pleasant. She looks well. She has no complaints.  Objective: Vital signs in last 24 hours: Temp:  [98.5 F (36.9 C)-99.9 F (37.7 C)] 99.3 F (37.4 C) (09/18 1600) Pulse Rate:  [76-88] 83 (09/18 1600) Resp:  [10-24] 22 (09/18 1600) BP: (105-154)/(71-93) 120/78 mmHg (09/18 1600) SpO2:  [93 %-100 %] 100 % (09/18 1600) Weight:  [112.2 kg (247 lb 5.7 oz)] 112.2 kg (247 lb 5.7 oz) (09/18 0500)  Intake/Output from previous day: 09/17 0701 - 09/18 0700 In: 870 [P.O.:720; IV Piggyback:150] Out: 475 [Urine:300; Drains:175] Intake/Output this shift: Total I/O In: 120 [P.O.:120] Out: 53 [Drains:53]  Physical exam the patient is alert and oriented x3. Her speech and strength is normal. Her pupils are equal. The patient's ventriculostomy is patent and draining less since being raised.  I reviewed the patient's head CT performed at [facility] today. It demonstrates evolution of a right intracerebral hemorrhage, there is less intraventricular blood. Her ventricles are decompressed.  Lab Results:  Recent Labs  07/16/13 0410 07/17/13 0320  WBC 6.5 5.6  HGB 12.9 12.6  HCT 39.6 38.3  PLT 188 183   BMET  Recent Labs  07/16/13 0410 07/17/13 0320  NA 137 135  K 3.7 4.1  CL 92* 93*  CO2 38* 35*  GLUCOSE 103* 112*  BUN 19 19  CREATININE 1.30* 1.05  CALCIUM 9.9 9.3    Studies/Results: Ct Head Wo Contrast  07/17/2013   *RADIOLOGY REPORT*  Clinical Data: Follow-up bleed.  Altered mental status.  CT HEAD WITHOUT CONTRAST  Technique:  Contiguous axial images were obtained from the base of the skull through the vertex without contrast.  Comparison: CT of head July 09, 2013 and July 07, 2013.  Findings: Redemonstration of right parietal intraparenchymal hemorrhage, 2.8 x 3.1 cm, with surrounding low density vasogenic edema. Mild local mass effect without  midline shift.  Mild degenerative blood plaques within the occipital horn with residual. intraventricular hematoma within the right lateral ventricle body. No hydrocephalous.  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  Prominent appearance of the right lateral ventricular body choroid plexus may reflect hemorrhage/hematoma. No hydrocephalous.  No new intraparenchymal hemorrhage.  No acute large vascular territory infarcts.  No abnormal extra-axial fluid collections.  Basal cisterns are patent.  Fluid density expanded sella partially imaged may reflect empty sella.  Minimal paranasal sinus mucosal thickening without air fluid levels.  No skull fracture.  Dural calcification along the falx.  Remote high right frontal burr hole.  IMPRESSION: Evolving right parietal intraparenchymal hematoma with local mass effect.  No new hemorrhage.  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   Original Report Authenticated By: Awilda Metro    Assessment/Plan: Right intracerebral hemorrhage, interventricular hemorrhage: The patient has tolerated racing of her ventriculostomy. I will close the ventriculostomy to light and remove it tomorrow if she remains clinically stable.  LOS: 11 days     Jordany Russett D 07/17/2013, 4:37 PM

## 2013-07-17 NOTE — Progress Notes (Signed)
Physical Therapy Treatment Patient Details Name: Alison Ward MRN: [id number] DOB: 11/08/1960 Today's Date: 07/17/2013 Time: 4098-1191 PT Time Calculation (min): 30 min  PT Assessment / Plan / Recommendation  History of Present Illness Alison Ward is an 52 y.o. female with a history of hypertension and poor compliance with taking medication, presenting with confusion and headache as well as lethargy. Patient was confused and called family members to get directions home. She was noted to be outside of the home but unknown resident. A pedestrian called EMS. Emergency services subsequently located family members. Blood pressure on arrival in the emergency room was 202/98. CT scan of her head showed a 4.1 x 3.1 cm parietotemporal intracerebral infarction with surrounding edema and intraventricular extension of hemorrhage. Patient had a 6 mm right to left midline shift. There is no previous history of stroke nor TIA. Patient has not been on antiplatelet therapy. NIH stroke score was 20. 55 F with ICH and elavated ICP   PT Comments   Pt admitted with abpve. Pt currently with functional limitations due to continued deficits in balance, level of arousal and decr cognition.  Pt will benefit from skilled PT to increase their independence and safety with mobility to allow discharge to the venue listed below.   Follow Up Recommendations  CIR;Supervision/Assistance - 24 hour                 Equipment Recommendations  Other (comment) (TBA)    Recommendations for Other Services Rehab consult  Frequency Min 3X/week   Progress towards PT Goals Progress towards PT goals: Progressing toward goals  Plan Current plan remains appropriate    Precautions / Restrictions Precautions Precautions: Fall Precaution Comments: ventricular shunt  Restrictions Weight Bearing Restrictions: No   Pertinent Vitals/Pain VSS, no pain    Mobility  Bed Mobility Bed Mobility: Supine to Sit;Sitting - Scoot to Edge  of Bed Rolling Left: Not tested (comment) Supine to Sit: 3: Mod assist;HOB elevated Sitting - Scoot to Edge of Bed: 3: Mod assist Details for Bed Mobility Assistance: Continues to need mod assist secondary to processing issues hindering ability for pt to follow through on commands unless PT initiates movement tactilly.    Transfers Transfers: Sit to Stand;Stand to Sit;Stand Pivot Transfers Sit to Stand: 1: +2 Total assist;With upper extremity assist;From bed Sit to Stand: Patient Percentage: 70% Stand to Sit: 1: +2 Total assist;Without upper extremity assist;To chair/3-in-1 Stand to Sit: Patient Percentage: 70% Stand Pivot Transfers: 1: +2 Total assist Stand Pivot Transfers: Patient Percentage: 70% Lateral/Scoot Transfers: Not tested (comment) Details for Transfer Assistance: Pt with total verbal and tactile cues for hand placement and sequencing for transfers. Demonstrated decreased control for descent for stand>sit.  Pt with less posterior lean upon standing.  Pt needed cues and assist to take steps to stand and turn to chair.  Needs constant repetition to keep moving as she does not stay attended to task for > than 5 seconds.  Pt needed to use the bathroom on arrival therefore assisted pt to the 3N1 and then to recliner.  Pt was much more alert today overall during treatment.   Ambulation/Gait Ambulation/Gait Assistance: 1: +2 Total assist Ambulation/Gait: Patient Percentage: 70% Ambulation Distance (Feet): 3 Feet Assistive device: Rolling walker Ambulation/Gait Assistance Details: Pt had a lot of difficulty following commands with stepping with RW.  Pt scissoring while trying to step around.   Gait Pattern: Step-to pattern;Decreased stride length;Ataxic;Scissoring;Wide base of support Stairs: No Wheelchair Mobility Wheelchair Mobility: No  Modified Rankin (Stroke Patients Only) Pre-Morbid Rankin Score: No symptoms Modified Rankin: Severe disability     PT Goals (current goals can now  be found in the care plan section)    Visit Information  Last PT Received On: 07/17/13 Assistance Needed: +2 History of Present Illness: Alison Ward is an 52 y.o. female with a history of hypertension and poor compliance with taking medication, presenting with confusion and headache as well as lethargy. Patient was confused and called family members to get directions home. She was noted to be outside of the home but unknown resident. A pedestrian called EMS. Emergency services subsequently located family members. Blood pressure on arrival in the emergency room was 202/98. CT scan of her head showed a 4.1 x 3.1 cm parietotemporal intracerebral infarction with surrounding edema and intraventricular extension of hemorrhage. Patient had a 6 mm right to left midline shift. There is no previous history of stroke nor TIA. Patient has not been on antiplatelet therapy. NIH stroke score was 20. 61 F with ICH and elavated ICP    Subjective Data  Subjective: "I feel so tired."   Cognition  Cognition Arousal/Alertness: Awake/alert Behavior During Therapy: WFL for tasks assessed/performed Overall Cognitive Status: Impaired/Different from baseline Area of Impairment: Attention;Following commands;Safety/judgement;Problem solving Orientation Level: Place;Time;Situation Current Attention Level: Sustained Memory: Decreased recall of precautions;Decreased short-term memory Following Commands: Follows one step commands inconsistently;Follows one step commands with increased time Safety/Judgement: Decreased awareness of safety;Decreased awareness of deficits Awareness: Intellectual Problem Solving: Slow processing;Decreased initiation;Difficulty sequencing;Requires verbal cues;Requires tactile cues General Comments: Pt continues to have slow processing of commands.   Difficult to assess due to: Level of arousal    Balance  Static Sitting Balance Static Sitting - Balance Support: Bilateral upper extremity  supported;Feet supported Static Sitting - Level of Assistance: 4: Min assist;3: Mod assist Static Sitting - Comment/# of Minutes: sat 3 min EOB.  Not losing balance in all directions as she usually does and overall was more alert.    End of Session PT - End of Session Equipment Utilized During Treatment: Gait belt Activity Tolerance: Patient limited by fatigue Patient left: in chair;with call bell/phone within reach;with family/visitor present;with restraints reapplied Nurse Communication: Mobility status        INGOLD,Viki Carrera 07/17/2013, 12:41 PM [facility] [phone] (pager)

## 2013-07-17 NOTE — Progress Notes (Signed)
PULMONARY  / CRITICAL CARE MEDICINE  Name: Alison Ward MRN: [id number] DOB: [birth year]    ADMISSION DATE:  07/06/2013 CONSULTATION DATE:  07/06/2013  REFERRING MD :  Dr. Lavella Lemons PRIMARY SERVICE: CCM  CHIEF COMPLAINT:  AMS  BRIEF PATIENT DESCRIPTION: 64 F with ICH a r/t HTN and elevated ICP.  SIGNIFICANT EVENTS / STUDIES: ICH 07/06/2013 s/p ventric ventric removed and replaced 9/8 Lovell Sheehan) 3% saline started 9/8, stopped 9/10  CULTURES: None  ANTIBIOTICS: Cefazolin 9/7 >> 9/12   SUBJECTIVE:  Headache much improved.    VITAL SIGNS: Temp:  [98.1 F (36.7 C)-99.9 F (37.7 C)] 99 F (37.2 C) (09/18 0831) Pulse Rate:  [74-88] 85 (09/18 1000) Resp:  [10-24] 14 (09/18 1000) BP: (114-154)/(71-93) 154/83 mmHg (09/18 1000) SpO2:  [93 %-100 %] 95 % (09/18 1000) Weight:  [112.2 kg (247 lb 5.7 oz)] 112.2 kg (247 lb 5.7 oz) (09/18 0500)  HEMODYNAMICS:    VENTILATOR SETTINGS:    INTAKE / OUTPUT: Intake/Output     09/17 0701 - 09/18 0700 09/18 0701 - 09/19 0700   P.O. 720    IV Piggyback 150    Total Intake(mL/kg) 870 (7.8)    Urine (mL/kg/hr) 300 (0.1)    Drains 175 (0.1) 17 (0)   Stool     Total Output 475 17   Net +395 -17        Urine Occurrence 4 x 1 x   Stool Occurrence  1 x     PHYSICAL EXAMINATION: General:  Obese F in NAD Neuro:  Awake and interacting, more alert, MAE, appropriate HEENT:  Sclera anicteric, pupil equal and reactive bilaterally, MMM, OP clear, L ventric  Neck:  Trachea supple and midline, (-) JVD, (-) LAN Cardiovascular:  RRR, NS1/S2, (-) MRG Lungs:  resps even non labored on [place], CTAB Abdomen:  S/NT/ND/(+)BS Musculoskeletal:  (-) C/C/E, OA toes biltaerally Skin:  Intact  LABS:  CBC Recent Labs     07/15/13  0419  07/16/13  0410  07/17/13  0320  WBC  7.0  6.5  5.6  HGB  13.3  12.9  12.6  HCT  40.6  39.6  38.3  PLT  206  188  183   Coag's No results found for this basename: APTT, INR,  in the last 72 hours  BMET Recent Labs      07/15/13  0419  07/16/13  0410  07/17/13  0320  NA  134*  137  135  K  3.5  3.7  4.1  CL  90*  92*  93*  CO2  35*  38*  35*  BUN  20  19  19   CREATININE  1.28*  1.30*  1.05  GLUCOSE  97  103*  112*   Electrolytes Recent Labs     07/15/13  0419  07/16/13  0410  07/17/13  0320  CALCIUM  9.5  9.9  9.3  MG  2.3  2.4  2.2  PHOS  3.9  3.0  3.7   Sepsis Markers No results found for this basename: LACTICACIDVEN, PROCALCITON, O2SATVEN,  in the last 72 hours  ABG No results found for this basename: PHART, PCO2ART, PO2ART,  in the last 72 hours  Liver Enzymes No results found for this basename: AST, ALT, ALKPHOS, BILITOT, ALBUMIN,  in the last 72 hours  Cardiac Enzymes No results found for this basename: TROPONINI, PROBNP,  in the last 72 hours  Glucose No results found for this basename: GLUCAP,  in  the last 72 hours  Imaging Ct Head Wo Contrast  07/17/2013   *RADIOLOGY REPORT*  Clinical Data: Follow-up bleed.  Altered mental status.  CT HEAD WITHOUT CONTRAST  Technique:  Contiguous axial images were obtained from the base of the skull through the vertex without contrast.  Comparison: CT of head July 09, 2013 and July 07, 2013.  Findings: Redemonstration of right parietal intraparenchymal hemorrhage, 2.8 x 3.1 cm, with surrounding low density vasogenic edema. Mild local mass effect without midline shift.  Mild degenerative blood plaques within the occipital horn with residual. intraventricular hematoma within the right lateral ventricle body. No hydrocephalous.  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  Prominent appearance of the right lateral ventricular body choroid plexus may reflect hemorrhage/hematoma. No hydrocephalous.  No new intraparenchymal hemorrhage.  No acute large vascular territory infarcts.  No abnormal extra-axial fluid collections.  Basal cisterns are patent.  Fluid  density expanded sella partially imaged may reflect empty sella.  Minimal paranasal sinus mucosal thickening without air fluid levels.  No skull fracture.  Dural calcification along the falx.  Remote high right frontal burr hole.  IMPRESSION: Evolving right parietal intraparenchymal hematoma with local mass effect.  No new hemorrhage.  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   Original Report Authenticated By: Awilda Metro   CXR:  No new CXR 9/14   ASSESSMENT / PLAN: Principal Problem:   Intracerebral hemorrhage Active Problems:   HYPERTENSION  PULMONARY A:  No active issues P:   - She is protecting airway adequately and mental status improved.  CARDIOVASCULAR A:  HTN: Intrinsic + reactive. Goal SBP <= 140 P:  - Nicardipine Drip off 9/8. - Cont hctz, lisinopril.  RENAL A: Hypokalemia, renal function deteriorated but now improving.  P:   - Hold further K replacement. - F/u BMP in AM.  GASTROINTESTINAL A: - Tolerating PO's well.  Continue diet.  HEMATOLOGIC A:   - No Acute Issues. - F/u CBC.  INFECTIOUS A: - Cefazolin 9/7 > 9/12 for ventric prophylaxis, now off.  ENDOCRINE A:   - No Acute Issues.  NEUROLOGIC A:  - Intracerebral Hemorrhage. - Intraventricular blood and enlargement s/p ventriculostomy 9/7 and replaced 9/8. - S/p 3% saline - stopped 9/10.  P:   - Cont Valproate. - Out of ICU when ventric able to be removed - still draining good amount of bloody CSF and NS would like to keep in place for a few more days. - F/u imaging per neurology's plans. - PRN pain rx for headache.  Alyson Reedy, M.D. [facility] Pulmonary/Critical Care Medicine. Pager: [phone]. After hours pager: [phone].

## 2013-07-17 NOTE — Progress Notes (Signed)
Stroke Team Progress Note  HISTORY Alison Ward is an 52 y.o. female with a history of hypertension and poor compliance with taking medication, presenting with confusion and headache as well as lethargy. Patient was confused and called family members to get directions home. A pedestrian called EMS. Emergency services subsequently located family members and brought her to the ED. Blood pressure on arrival in the emergency room was 202/98. CT scan of her head showed a 4.1 x 3.1 cm parietotemporal intracerebral infarction with surrounding edema and intraventricular extension of hemorrhage. Patient had a 6 mm right to left midline shift. There is no previous history of stroke nor TIA. Patient has not been on antiplatelet therapy. NIH stroke score was 20. Patient was not a TPA candidate secondary to ICH  She was admitted to the neuro ICU for further evaluation and treatment.  Patient initially had to undergo placement of right frontal ventriculostomy via a burr hole but continued to have decreased mental status and on 07/07/2013 had to undergo placement of left frontal ventriculostomy via a burr hole and removal of right ventriculostomy.   SUBJECTIVE Patient remains awake with no change in memory. Family at bedside.    OBJECTIVE Most recent Vital Signs: Filed Vitals:   07/17/13 0400 07/17/13 0500 07/17/13 0600 07/17/13 0700  BP: 131/86 150/81 145/76 131/90  Pulse: 77 77 80 85  Temp: 98.9 F (37.2 C)     TempSrc: Oral     Resp: 18 11 14 17   Height:      Weight:  112.2 kg (247 lb 5.7 oz)    SpO2: 94% 95% 100% 96%   CBG (last 3)  No results found for this basename: GLUCAP,  in the last 72 hours  IV Fluid Intake:      MEDICATIONS  . atorvastatin  40 mg Oral q1800  . calcium carbonate  200 mg of elemental calcium Oral BID WC  .  ceFAZolin (ANCEF) IV  2 g Intravenous Q8H  . divalproex  500 mg Oral Q8H  . enoxaparin (LOVENOX) injection  40 mg Subcutaneous Q24H  . feeding supplement  237  mL Oral BID BM  . pantoprazole  40 mg Oral Daily  . potassium chloride  40 mEq Oral Once  . senna-docusate  1 tablet Oral BID   PRN:  acetaminophen, acetaminophen, labetalol, LORazepam, magnesium hydroxide, ondansetron, oxyCODONE  Diet:  General Activity: bedrest DVT Prophylaxis: lovenox CLINICALLY SIGNIFICANT STUDIES Basic Metabolic Panel:   Recent Labs Lab 07/16/13 0410 07/17/13 0320  NA 137 135  K 3.7 4.1  CL 92* 93*  CO2 38* 35*  GLUCOSE 103* 112*  BUN 19 19  CREATININE 1.30* 1.05  CALCIUM 9.9 9.3  MG 2.4 2.2  PHOS 3.0 3.7   Liver Function Tests:   Recent Labs Lab 07/11/13 0500  AST 16  ALT 7  ALKPHOS 52  BILITOT 0.5  PROT 7.1  ALBUMIN 3.0*   CBC:   Recent Labs Lab 07/16/13 0410 07/17/13 0320  WBC 6.5 5.6  HGB 12.9 12.6  HCT 39.6 38.3  MCV 85.5 85.5  PLT 188 183   Coagulation:  No results found for this basename: LABPROT, INR,  in the last 168 hours Cardiac Enzymes: No results found for this basename: CKTOTAL, CKMB, CKMBINDEX, TROPONINI,  in the last 168 hours Urinalysis:  No results found for this basename: COLORURINE, APPERANCEUR, LABSPEC, PHURINE, GLUCOSEU, HGBUR, BILIRUBINUR, KETONESUR, PROTEINUR, UROBILINOGEN, NITRITE, LEUKOCYTESUR,  in the last 168 hours Lipid Panel    Component Value  Date/Time   CHOL 244* 07/06/2013 0420   TRIG 153* 07/06/2013 0420   HDL 45 07/06/2013 0420   CHOLHDL 5.4 07/06/2013 0420   VLDL 31 07/06/2013 0420   LDLCALC 168* 07/06/2013 0420   HgbA1C  Lab Results  Component Value Date   HGBA1C 6.0* 07/06/2013    Urine Drug Screen:   No results found for this basename: labopia,  cocainscrnur,  labbenz,  amphetmu,  thcu,  labbarb    Alcohol Level: No results found for this basename: ETH,  in the last 168 hours  Ct Angio Head W/cm &/or Wo Cm 07/07/2013    1.  Minimal distal small vessel disease. 2.  No significant vascular lesion associated with the area of hemorrhage. 3.  Stable right parietal parenchymal hemorrhage. 4.  Status  post right to the tracheostomy placement without evidence for hydrocephalus. 5.  The extent of intraventricular hemorrhage has increased.     CT Head  07/17/2013 Evolving right parietal intraparenchymal hematoma with local mass effect. No new hemorrhage. Degenerating intraventricular blood products, ventriculoperitoneal  shunt in place without hydrocephalous 07/09/2013 Markedly limited exam secondary to motion. There appears to be new dependent left ventricular hemorrhage. Right ventricle hemorrhage and right parietal - posterior temporal hematoma once again noted.  07/06/2013 Large intracerebral hematoma at the posterior right parietotemporal lobe, 4.1 x 3.1 cm in size with surrounding edema and evidence of intraventricular extension of hemorrhage. Question minimal subarachnoid blood at high posterior right parieta region. 6 mm of right-to-left midline shift and diffuse sulcal effacement noted.  MRI of the brain   MRA of the brain    2D Echocardiogram  EF 65%, no regional wall motion abnormality identified, no cardiac source of emboli identified.  Carotid Doppler  Findings suggest 1-39% stenosis bilaterally. Vertebral arteries are patent with antegrade flow.  CXR  07/07/2013   1.  Interval placement of left IJ line, tip overlying the level of the superior vena cava - SVC confluence. 2.  No postprocedure pneumothorax. 3.  Cardiomegaly and vascular congestion.  07/07/2013  Increased pulmonary vascular congestion now with mild pulmonary edema.  Stable cardiomegaly.   07/06/13 Shallow inspiration. Mild cardiac enlargement. No evidence of active pulmonary disease.   EKG Sinus rhythmProbable LVH with secondary repol abnrm   Therapy Recommendations CIR  Physical Exam   GENERAL EXAM: Patient is in no distress  CARDIOVASCULAR: Regular rate and rhythm, no murmurs, no carotid bruits  NEUROLOGIC: MENTAL STATUS: awake, SLOW RESPONSES. CRANIAL NERVE: pupils equal and reactive to light, visual fields full  to confrontation, extraocular muscles intact, no nystagmus, facial sensation and strength symmetric, uvula midline, shoulder shrug symmetric, tongue midline. MOTOR: LUE AND LLE 4, RIGHT SIDE FULL STRENGTH SENSORY: SLIGHTLY DECR ON LEFT SIDE   ASSESSMENT Ms. ARALI SOMERA is a 52 y.o. 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 Patient initially underwent placement of right frontal ventriculostomy, due to  continued decreased mental status had to undergo placement of left frontal ventriculostomy with removal of right ventriculostomy. She had intermittent development 07/09/2013 Of a new dependent left ventricular hemorrhage. IVC drainage continues.    Hyperlipidemia, new lipitor, LDL 168 goal < 100 for non diabetics.   Cytotoxic edema, 3% saline discontinued.  Malignant Hypertension. HCTZ home med resumed. Added Lisinopril 5mg  daily.  Hypokalemia, resolved  Induced hypernatremia in order to decrease cerebral edema  with 3% saline, protocol d/c'd 9/12  Hospital day # 11  TREATMENT/PLAN  No change in neuro care.  SBP goal < 180  Neurosurgeon following ventriculostomy - Dr. Lovell Sheehan to evaluate regarding removal of ventriculostomy today. Once removed will keep in unit one day and then transfer out.  Rehab following with plans to transfer when medically ready.  Gwendolyn Lima. Manson Passey, [facility], MBA, MHA [facility] Pager: [phone] 07/17/2013 8:04 AM   I have personally obtained a history, examined the patient, evaluated imaging results, and formulated the assessment and plan of care. I agree with the above.  Delia Heady, MD

## 2013-07-18 LAB — BASIC METABOLIC PANEL
BUN: 13 mg/dL (ref 6–23)
CO2: 34 mEq/L — ABNORMAL HIGH (ref 19–32)
Calcium: 9.3 mg/dL (ref 8.4–10.5)
Chloride: 97 mEq/L (ref 96–112)
Creatinine, Ser: 1.05 mg/dL (ref 0.50–1.10)

## 2013-07-18 LAB — CBC
HCT: 38.3 % (ref 36.0–46.0)
MCH: 28.5 pg (ref 26.0–34.0)
MCV: 86.1 fL (ref 78.0–100.0)
RBC: 4.45 MIL/uL (ref 3.87–5.11)
WBC: 5.8 10*3/uL (ref 4.0–10.5)

## 2013-07-18 MED ORDER — DIVALPROEX SODIUM ER 500 MG PO TB24
1000.0000 mg | ORAL_TABLET | Freq: Every day | ORAL | Status: DC
  Administered 2013-07-18 – 2013-07-21 (×4): 1000 mg via ORAL
  Filled 2013-07-18 (×4): qty 2

## 2013-07-18 NOTE — Progress Notes (Signed)
Stroke Team Progress Note  HISTORY Alison Ward is an 52 y.o. female with a history of hypertension and poor compliance with taking medication, presenting with confusion and headache as well as lethargy. Patient was confused and called family members to get directions home. A pedestrian called EMS. Emergency services subsequently located family members and brought her to the ED. Blood pressure on arrival in the emergency room was 202/98. CT scan of her head showed a 4.1 x 3.1 cm parietotemporal intracerebral infarction with surrounding edema and intraventricular extension of hemorrhage. Patient had a 6 mm right to left midline shift. There is no previous history of stroke nor TIA. Patient has not been on antiplatelet therapy. NIH stroke score was 20. Patient was not a TPA candidate secondary to ICH  She was admitted to the neuro ICU for further evaluation and treatment.  Patient initially had to undergo placement of right frontal ventriculostomy via a burr hole but continued to have decreased mental status and on 07/07/2013 had to undergo placement of left frontal ventriculostomy via a burr hole and removal of right ventriculostomy.   SUBJECTIVE Awake, alert. Just had drain out. Some headache but "tolerable".    OBJECTIVE Most recent Vital Signs: Filed Vitals:   07/18/13 0400 07/18/13 0500 07/18/13 0600 07/18/13 0700  BP: 133/67 135/78 137/68 145/83  Pulse:   76 75  Temp: 98.4 F (36.9 C)     TempSrc: Oral     Resp: 24 22 19 24   Height:      Weight:  110.3 kg (243 lb 2.7 oz)    SpO2:   96% 97%   CBG (last 3)  No results found for this basename: GLUCAP,  in the last 72 hours  IV Fluid Intake:      MEDICATIONS  . atorvastatin  40 mg Oral q1800  . calcium carbonate  200 mg of elemental calcium Oral BID WC  .  ceFAZolin (ANCEF) IV  2 g Intravenous Q8H  . divalproex  500 mg Oral Q8H  . enoxaparin (LOVENOX) injection  40 mg Subcutaneous Q24H  . feeding supplement  237 mL Oral BID  BM  . pantoprazole  40 mg Oral Daily  . potassium chloride  40 mEq Oral Once  . senna-docusate  1 tablet Oral BID   PRN:  acetaminophen, acetaminophen, labetalol, LORazepam, magnesium hydroxide, ondansetron, oxyCODONE  Diet:  General Activity: bedrest DVT Prophylaxis: lovenox CLINICALLY SIGNIFICANT STUDIES Basic Metabolic Panel:   Recent Labs Lab 07/17/13 0320 07/18/13 0340  NA 135 138  K 4.1 3.9  CL 93* 97  CO2 35* 34*  GLUCOSE 112* 104*  BUN 19 13  CREATININE 1.05 1.05  CALCIUM 9.3 9.3  MG 2.2 2.1  PHOS 3.7 4.2   Liver Function Tests:  No results found for this basename: AST, ALT, ALKPHOS, BILITOT, PROT, ALBUMIN,  in the last 168 hours CBC:   Recent Labs Lab 07/17/13 0320 07/18/13 0340  WBC 5.6 5.8  HGB 12.6 12.7  HCT 38.3 38.3  MCV 85.5 86.1  PLT 183 174   Coagulation:  No results found for this basename: LABPROT, INR,  in the last 168 hours Cardiac Enzymes: No results found for this basename: CKTOTAL, CKMB, CKMBINDEX, TROPONINI,  in the last 168 hours Urinalysis:  No results found for this basename: COLORURINE, APPERANCEUR, LABSPEC, PHURINE, GLUCOSEU, HGBUR, BILIRUBINUR, KETONESUR, PROTEINUR, UROBILINOGEN, NITRITE, LEUKOCYTESUR,  in the last 168 hours Lipid Panel    Component Value Date/Time   CHOL 244* 07/06/2013 0420  TRIG 153* 07/06/2013 0420   HDL 45 07/06/2013 0420   CHOLHDL 5.4 07/06/2013 0420   VLDL 31 07/06/2013 0420   LDLCALC 168* 07/06/2013 0420   HgbA1C  Lab Results  Component Value Date   HGBA1C 6.0* 07/06/2013    Urine Drug Screen:   No results found for this basename: labopia,  cocainscrnur,  labbenz,  amphetmu,  thcu,  labbarb    Alcohol Level: No results found for this basename: ETH,  in the last 168 hours  Ct Angio Head W/cm &/or Wo Cm 07/07/2013    1.  Minimal distal small vessel disease. 2.  No significant vascular lesion associated with the area of hemorrhage. 3.  Stable right parietal parenchymal hemorrhage. 4.  Status post right to the  tracheostomy placement without evidence for hydrocephalus. 5.  The extent of intraventricular hemorrhage has increased.     CT Head  07/17/2013 Evolving right parietal intraparenchymal hematoma with local mass effect. No new hemorrhage. Degenerating intraventricular blood products, ventriculoperitoneal  shunt in place without hydrocephalous 07/09/2013 Markedly limited exam secondary to motion. There appears to be new dependent left ventricular hemorrhage. Right ventricle hemorrhage and right parietal - posterior temporal hematoma once again noted.  07/06/2013 Large intracerebral hematoma at the posterior right parietotemporal lobe, 4.1 x 3.1 cm in size with surrounding edema and evidence of intraventricular extension of hemorrhage. Question minimal subarachnoid blood at high posterior right parieta region. 6 mm of right-to-left midline shift and diffuse sulcal effacement noted.  MRI of the brain   MRA of the brain    2D Echocardiogram  EF 65%, no regional wall motion abnormality identified, no cardiac source of emboli identified.  Carotid Doppler  Findings suggest 1-39% stenosis bilaterally. Vertebral arteries are patent with antegrade flow.  CXR  07/07/2013   1.  Interval placement of left IJ line, tip overlying the level of the superior vena cava - SVC confluence. 2.  No postprocedure pneumothorax. 3.  Cardiomegaly and vascular congestion.  07/07/2013  Increased pulmonary vascular congestion now with mild pulmonary edema.  Stable cardiomegaly.   07/06/13 Shallow inspiration. Mild cardiac enlargement. No evidence of active pulmonary disease.   EKG Sinus rhythmProbable LVH with secondary repol abnrm   Therapy Recommendations CIR  Physical Exam   GENERAL EXAM: Patient is in no distress  CARDIOVASCULAR: Regular rate and rhythm, no murmurs, no carotid bruits  NEUROLOGIC: MENTAL STATUS: awake, SLOW RESPONSES. CRANIAL NERVE: pupils equal and reactive to light, visual fields full to confrontation,  extraocular muscles intact, no nystagmus, facial sensation and strength symmetric, uvula midline, shoulder shrug symmetric, tongue midline. MOTOR: LUE AND LLE 4, RIGHT SIDE FULL STRENGTH SENSORY: SLIGHTLY DECR ON LEFT SIDE   ASSESSMENT Ms. STACY SAILER is a 51 y.o. 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 Patient initially underwent placement of right frontal ventriculostomy, due to  continued decreased mental status had to undergo placement of left frontal ventriculostomy with removal of right ventriculostomy. She had intermittent development 07/09/2013 Of a new dependent left ventricular hemorrhage. IVC drainage continues.    Hyperlipidemia, new lipitor, LDL 168 goal < 100 for non diabetics.   Cytotoxic edema, 3% saline discontinued.  Malignant Hypertension. HCTZ home med resumed. Added Lisinopril 5mg  daily.  Hypokalemia, resolved  Induced hypernatremia in order to decrease cerebral edema with 3% saline, protocol d/c'd 9/12  Hospital day #  12  TREATMENT/PLAN  No change in neuro care.  SBP goal < 180, at goal.  Ventriculostomy - Dr. Lovell Sheehan, ventriculostomy removed today.  If patient remains medically stable overnight, should be ready from neurological standpoint to transfer to CIR when bed available.  Gwendolyn Lima. Manson Passey, [facility], MBA, MHA [facility] Pager: [phone] 07/18/2013 7:49 AM   I have personally obtained a history, examined the patient, evaluated imaging results, and formulated the assessment and plan of care. I agree with the above.  Delia Heady, MD

## 2013-07-18 NOTE — Progress Notes (Signed)
Rehab admissions - Noted Ventric out now.  I spoke with patient and her mother this am.  I will follow progress over weekend and check back on Monday for rehab needs.  Call me for questions.  #[phone]

## 2013-07-18 NOTE — Progress Notes (Signed)
UR completed.  Lincoln Ginley, RN BSN MHA CCM Trauma/Neuro ICU Case Manager [phone]  

## 2013-07-18 NOTE — Progress Notes (Signed)
PULMONARY  / CRITICAL CARE MEDICINE  Name: Alison Ward MRN: [id number] DOB: 02/21/1961    ADMISSION DATE:  07/06/2013 CONSULTATION DATE:  07/06/2013  REFERRING MD :  Dr. Lavella Lemons PRIMARY SERVICE: CCM  CHIEF COMPLAINT:  AMS  BRIEF PATIENT DESCRIPTION: 24 F with ICH a r/t HTN and elevated ICP.  SIGNIFICANT EVENTS / STUDIES: ICH 07/06/2013 s/p ventric ventric removed and replaced 9/8 Lovell Sheehan) 3% saline started 9/8, stopped 9/10  CULTURES: None  ANTIBIOTICS: Cefazolin 9/7 >> 9/12   SUBJECTIVE:  Headache much improved.    VITAL SIGNS: Temp:  [98.4 F (36.9 C)-100.3 F (37.9 C)] 98.5 F (36.9 C) (09/19 0800) Pulse Rate:  [75-88] 77 (09/19 0800) Resp:  [5-25] 25 (09/19 0800) BP: (105-158)/(67-104) 151/78 mmHg (09/19 0800) SpO2:  [96 %-100 %] 100 % (09/19 0800) Weight:  [110.3 kg (243 lb 2.7 oz)] 110.3 kg (243 lb 2.7 oz) (09/19 0500)  HEMODYNAMICS:    VENTILATOR SETTINGS:    INTAKE / OUTPUT: Intake/Output     09/18 0701 - 09/19 0700 09/19 0701 - 09/20 0700   P.O. 120    IV Piggyback 150    Total Intake(mL/kg) 270 (2.4)    Urine (mL/kg/hr)     Drains 55 (0)    Total Output 55     Net +215          Urine Occurrence 5 x    Stool Occurrence 2 x      PHYSICAL EXAMINATION: General:  Obese F in NAD Neuro:  Awake and interacting, alert, MAE, appropriate HEENT:  Sclera anicteric, pupil equal and reactive bilaterally, MMM, OP clear, L ventric  Neck:  Trachea supple and midline, (-) JVD, (-) LAN Cardiovascular:  RRR, NS1/S2, (-) MRG Lungs:  resps even non labored on [place], CTAB Abdomen:  S/NT/ND/(+)BS Musculoskeletal:  (-) C/C/E, OA toes biltaerally Skin:  Intact  LABS:  CBC Recent Labs     07/16/13  0410  07/17/13  0320  07/18/13  0340  WBC  6.5  5.6  5.8  HGB  12.9  12.6  12.7  HCT  39.6  38.3  38.3  PLT  188  183  174   Coag's No results found for this basename: APTT, INR,  in the last 72 hours  BMET Recent Labs     07/16/13  0410  07/17/13  0320   07/18/13  0340  NA  137  135  138  K  3.7  4.1  3.9  CL  92*  93*  97  CO2  38*  35*  34*  BUN  19  19  13   CREATININE  1.30*  1.05  1.05  GLUCOSE  103*  112*  104*   Electrolytes Recent Labs     07/16/13  0410  07/17/13  0320  07/18/13  0340  CALCIUM  9.9  9.3  9.3  MG  2.4  2.2  2.1  PHOS  3.0  3.7  4.2   Sepsis Markers No results found for this basename: LACTICACIDVEN, PROCALCITON, O2SATVEN,  in the last 72 hours  ABG No results found for this basename: PHART, PCO2ART, PO2ART,  in the last 72 hours  Liver Enzymes No results found for this basename: AST, ALT, ALKPHOS, BILITOT, ALBUMIN,  in the last 72 hours  Cardiac Enzymes No results found for this basename: TROPONINI, PROBNP,  in the last 72 hours  Glucose No results found for this basename: GLUCAP,  in the last 72 hours  Imaging Ct  Head Wo Contrast  07/17/2013   *RADIOLOGY REPORT*  Clinical Data: Follow-up bleed.  Altered mental status.  CT HEAD WITHOUT CONTRAST  Technique:  Contiguous axial images were obtained from the base of the skull through the vertex without contrast.  Comparison: CT of head July 09, 2013 and July 07, 2013.  Findings: Redemonstration of right parietal intraparenchymal hemorrhage, 2.8 x 3.1 cm, with surrounding low density vasogenic edema. Mild local mass effect without midline shift.  Mild degenerative blood plaques within the occipital horn with residual. intraventricular hematoma within the right lateral ventricle body. No hydrocephalous.  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  Prominent appearance of the right lateral ventricular body choroid plexus may reflect hemorrhage/hematoma. No hydrocephalous.  No new intraparenchymal hemorrhage.  No acute large vascular territory infarcts.  No abnormal extra-axial fluid collections.  Basal cisterns are patent.  Fluid density expanded sella partially imaged  may reflect empty sella.  Minimal paranasal sinus mucosal thickening without air fluid levels.  No skull fracture.  Dural calcification along the falx.  Remote high right frontal burr hole.  IMPRESSION: Evolving right parietal intraparenchymal hematoma with local mass effect.  No new hemorrhage.  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   Original Report Authenticated By: Awilda Metro   CXR:  No new CXR 9/14   ASSESSMENT / PLAN: Principal Problem:   Intracerebral hemorrhage Active Problems:   HYPERTENSION  PULMONARY A:  No active issues P:   - She is protecting airway adequately and mental status improved.  CARDIOVASCULAR A:  HTN: Intrinsic + reactive. Goal SBP <= 140 P:  - Nicardipine Drip off 9/8. - Cont hctz, lisinopril.  RENAL A: Hypokalemia, renal function deteriorated but now improving.  P:   - Hold further K replacement. - F/u BMP in AM.  GASTROINTESTINAL A: - Tolerating PO's well.  Continue diet.  HEMATOLOGIC A:   - No Acute Issues. - F/u CBC.  INFECTIOUS A: - Cefazolin 9/7 > 9/12 for ventric prophylaxis, now off.  ENDOCRINE A:   - No Acute Issues.  NEUROLOGIC A:  - Intracerebral Hemorrhage. - Clamped ventric yesterday and will remove today. - S/p 3% saline - stopped 9/10.  P:   - Cont Valproate. - Ventric out today, if CT in AM is stable and mental status remains stable, can likely move out of the ICU in AM. - F/u imaging per neurology's plans. - PRN pain rx for headache.  Alyson Reedy, M.D. [facility] Pulmonary/Critical Care Medicine. Pager: [phone]. After hours pager: [phone].

## 2013-07-18 NOTE — Progress Notes (Signed)
Patient ID: Alison Ward, female   DOB: 10-24-1961, 52 y.o.   MRN: [id number] Subjective:  The patient is alert and pleasant. She denies headaches.  Objective: Vital signs in last 24 hours: Temp:  [98.4 F (36.9 C)-100.3 F (37.9 C)] 98.4 F (36.9 C) (09/19 0400) Pulse Rate:  [76-88] 86 (09/19 0000) Resp:  [5-25] 22 (09/19 0500) BP: (105-158)/(67-104) 135/78 mmHg (09/19 0500) SpO2:  [95 %-100 %] 100 % (09/19 0000) Weight:  [110.3 kg (243 lb 2.7 oz)] 110.3 kg (243 lb 2.7 oz) (09/19 0500)  Intake/Output from previous day: 09/18 0701 - 09/19 0700 In: 270 [P.O.:120; IV Piggyback:150] Out: 55 [Drains:55] Intake/Output this shift:    Physical exam patient is alert and oriented x3. She is moving all 4 extremities well. Her speech is normal.  Lab Results:  Recent Labs  07/17/13 0320 07/18/13 0340  WBC 5.6 5.8  HGB 12.6 12.7  HCT 38.3 38.3  PLT 183 174   BMET  Recent Labs  07/17/13 0320 07/18/13 0340  NA 135 138  K 4.1 3.9  CL 93* 97  CO2 35* 34*  GLUCOSE 112* 104*  BUN 19 13  CREATININE 1.05 1.05  CALCIUM 9.3 9.3    Studies/Results: Ct Head Wo Contrast  07/17/2013   *RADIOLOGY REPORT*  Clinical Data: Follow-up bleed.  Altered mental status.  CT HEAD WITHOUT CONTRAST  Technique:  Contiguous axial images were obtained from the base of the skull through the vertex without contrast.  Comparison: CT of head July 09, 2013 and July 07, 2013.  Findings: Redemonstration of right parietal intraparenchymal hemorrhage, 2.8 x 3.1 cm, with surrounding low density vasogenic edema. Mild local mass effect without midline shift.  Mild degenerative blood plaques within the occipital horn with residual. intraventricular hematoma within the right lateral ventricle body. No hydrocephalous.  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  Prominent appearance of the right lateral ventricular body  choroid plexus may reflect hemorrhage/hematoma. No hydrocephalous.  No new intraparenchymal hemorrhage.  No acute large vascular territory infarcts.  No abnormal extra-axial fluid collections.  Basal cisterns are patent.  Fluid density expanded sella partially imaged may reflect empty sella.  Minimal paranasal sinus mucosal thickening without air fluid levels.  No skull fracture.  Dural calcification along the falx.  Remote high right frontal burr hole.  IMPRESSION: Evolving right parietal intraparenchymal hematoma with local mass effect.  No new hemorrhage.  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   Original Report Authenticated By: Awilda Metro    Assessment/Plan: Right intracerebral hemorrhage, intraventricular hemorrhage, hydrocephalus: The patient has tolerated clamping of her ventriculostomy. I will remove it this morning.  LOS: 12 days     Dhani Imel D 07/18/2013, 7:32 AM

## 2013-07-18 NOTE — Progress Notes (Signed)
Speech Language Pathology Treatment Patient Details Name: Alison Ward MRN: [id number] DOB: 07-30-1961 Today's Date: 07/18/2013 Time: 9147-8295 SLP Time Calculation (min): 30 min  Assessment / Plan / Recommendation Clinical Impression       SLP Plan  Continue with current plan of care    Pertinent Vitals/Pain   SLP Goals  SLP Goals Potential to Achieve Goals: Good SLP Goal #3 - Progress: Progressing toward goal  General Temperature Spikes Noted: No  Oral Cavity - Oral Hygiene     Treatment Treatment focused on: Cognition Skilled Treatment: Treatment session focused on cognitive goals of orientation, reasoning, memory/recall, awareness. Patient oriented to time, place, situation and able to recall basic information related to medical care and testing received. She recalled MD's name when clinician asked questions related to her hospital stay. Patient also demonstrated good insight into deficits, as well as progression in her recovery, stating,"at first I had some struggle with my side.Marland KitchenMarland KitchenI could keep my right leg lifted up longer than my left. but now I am managing both sides very well." When questioned, patient stated that she may move in temporarily with her daughter. She continues to demonstrate decreased anticipatory awareness, but appears to be improving cognitively, as compared to findings from previous SLP tx session notes.    GO     Elio Forget Tarrell 07/18/2013, 9:46 PM  Angela Nevin, MA, CCC-SLP [facility] Speech-Language Pathologist

## 2013-07-19 LAB — BASIC METABOLIC PANEL
BUN: 14 mg/dL (ref 6–23)
Calcium: 9.1 mg/dL (ref 8.4–10.5)
Creatinine, Ser: 1.09 mg/dL (ref 0.50–1.10)
GFR calc Af Amer: 65 mL/min — ABNORMAL LOW (ref >90)
GFR calc non Af Amer: 56 mL/min — ABNORMAL LOW (ref >90)

## 2013-07-19 LAB — CBC
HCT: 35.5 % — ABNORMAL LOW (ref 36.0–46.0)
MCH: 28.5 pg (ref 26.0–34.0)
MCHC: 33 g/dL (ref 30.0–36.0)
MCV: 86.4 fL (ref 78.0–100.0)
Platelets: 177 10*3/uL (ref 150–400)
RDW: 16.3 % — ABNORMAL HIGH (ref 11.5–15.5)
WBC: 6.9 10*3/uL (ref 4.0–10.5)

## 2013-07-19 LAB — MAGNESIUM: Magnesium: 2 mg/dL (ref 1.5–2.5)

## 2013-07-19 NOTE — Progress Notes (Signed)
Patient ID: Alison Ward, female   DOB: [birth year], 52 y.o.   MRN: [id number] Seems to be doing very well. Denies headache. Moves all extremities equally. She is awake and alert and conversant. She likely can be transferred to the floor.

## 2013-07-19 NOTE — Progress Notes (Signed)
Stroke Team Progress Note  HISTORY Alison Ward is an 52 y.o. female with a history of hypertension and poor compliance with taking medication, presenting with confusion and headache as well as lethargy. Patient was confused and called family members to get directions home. A pedestrian called EMS. Emergency services subsequently located family members and brought her to the ED. Blood pressure on arrival in the emergency room was 202/98. CT scan of her head showed a 4.1 x 3.1 cm parietotemporal intracerebral infarction with surrounding edema and intraventricular extension of hemorrhage. Patient had a 6 mm right to left midline shift. There is no previous history of stroke nor TIA. Patient has not been on antiplatelet therapy. NIH stroke score was 20. Patient was not a TPA candidate secondary to ICH  She was admitted to the neuro ICU for further evaluation and treatment.  Patient initially had to undergo placement of right frontal ventriculostomy via a burr hole but continued to have decreased mental status and on 07/07/2013 had to undergo placement of left frontal ventriculostomy via a burr hole and removal of right ventriculostomy.   SUBJECTIVE Awake, alert. Drain out on 9/19. Tolerating well. Evaluated by NSX this AM, likely to floor today. She notes no new complaints. Mild headache, stable compared to yesterday.    OBJECTIVE Most recent Vital Signs: Filed Vitals:   07/19/13 0500 07/19/13 0600 07/19/13 0700 07/19/13 0800  BP: 143/85 159/85 144/88 146/86  Pulse: 65 65 71   Temp:    98.5 F (36.9 C)  TempSrc:    Oral  Resp: 17 19 18 14   Height:      Weight: 242 lb 15.2 oz (110.2 kg)     SpO2: 98% 98% 96% 97%   CBG (last 3)  No results found for this basename: GLUCAP,  in the last 72 hours  IV Fluid Intake:      MEDICATIONS  . atorvastatin  40 mg Oral q1800  . calcium carbonate  200 mg of elemental calcium Oral BID WC  .  ceFAZolin (ANCEF) IV  2 g Intravenous Q8H  . divalproex   1,000 mg Oral Daily  . enoxaparin (LOVENOX) injection  40 mg Subcutaneous Q24H  . feeding supplement  237 mL Oral BID BM  . pantoprazole  40 mg Oral Daily  . potassium chloride  40 mEq Oral Once  . senna-docusate  1 tablet Oral BID   PRN:  acetaminophen, acetaminophen, labetalol, LORazepam, magnesium hydroxide, ondansetron, oxyCODONE  Diet:  General Activity: bedrest DVT Prophylaxis: lovenox CLINICALLY SIGNIFICANT STUDIES Basic Metabolic Panel:   Recent Labs Lab 07/18/13 0340 07/19/13 0435  NA 138 137  K 3.9 3.8  CL 97 97  CO2 34* 31  GLUCOSE 104* 91  BUN 13 14  CREATININE 1.05 1.09  CALCIUM 9.3 9.1  MG 2.1 2.0  PHOS 4.2 3.3   Liver Function Tests:  No results found for this basename: AST, ALT, ALKPHOS, BILITOT, PROT, ALBUMIN,  in the last 168 hours CBC:   Recent Labs Lab 07/18/13 0340 07/19/13 0435  WBC 5.8 6.9  HGB 12.7 11.7*  HCT 38.3 35.5*  MCV 86.1 86.4  PLT 174 177   Coagulation:  No results found for this basename: LABPROT, INR,  in the last 168 hours Cardiac Enzymes: No results found for this basename: CKTOTAL, CKMB, CKMBINDEX, TROPONINI,  in the last 168 hours Urinalysis:  No results found for this basename: COLORURINE, APPERANCEUR, LABSPEC, PHURINE, GLUCOSEU, HGBUR, BILIRUBINUR, KETONESUR, PROTEINUR, UROBILINOGEN, NITRITE, LEUKOCYTESUR,  in the last  168 hours Lipid Panel    Component Value Date/Time   CHOL 244* 07/06/2013 0420   TRIG 153* 07/06/2013 0420   HDL 45 07/06/2013 0420   CHOLHDL 5.4 07/06/2013 0420   VLDL 31 07/06/2013 0420   LDLCALC 168* 07/06/2013 0420   HgbA1C  Lab Results  Component Value Date   HGBA1C 6.0* 07/06/2013    Urine Drug Screen:   No results found for this basename: labopia,  cocainscrnur,  labbenz,  amphetmu,  thcu,  labbarb    Alcohol Level: No results found for this basename: ETH,  in the last 168 hours  Ct Angio Head W/cm &/or Wo Cm 07/07/2013    1.  Minimal distal small vessel disease. 2.  No significant vascular lesion  associated with the area of hemorrhage. 3.  Stable right parietal parenchymal hemorrhage. 4.  Status post right to the tracheostomy placement without evidence for hydrocephalus. 5.  The extent of intraventricular hemorrhage has increased.     CT Head  07/17/2013 Evolving right parietal intraparenchymal hematoma with local mass effect. No new hemorrhage. Degenerating intraventricular blood products, ventriculoperitoneal  shunt in place without hydrocephalous 07/09/2013 Markedly limited exam secondary to motion. There appears to be new dependent left ventricular hemorrhage. Right ventricle hemorrhage and right parietal - posterior temporal hematoma once again noted.  07/06/2013 Large intracerebral hematoma at the posterior right parietotemporal lobe, 4.1 x 3.1 cm in size with surrounding edema and evidence of intraventricular extension of hemorrhage. Question minimal subarachnoid blood at high posterior right parieta region. 6 mm of right-to-left midline shift and diffuse sulcal effacement noted.  MRI of the brain   MRA of the brain    2D Echocardiogram  EF 65%, no regional wall motion abnormality identified, no cardiac source of emboli identified.  Carotid Doppler  Findings suggest 1-39% stenosis bilaterally. Vertebral arteries are patent with antegrade flow.  CXR  07/07/2013   1.  Interval placement of left IJ line, tip overlying the level of the superior vena cava - SVC confluence. 2.  No postprocedure pneumothorax. 3.  Cardiomegaly and vascular congestion.  07/07/2013  Increased pulmonary vascular congestion now with mild pulmonary edema.  Stable cardiomegaly.   07/06/13 Shallow inspiration. Mild cardiac enlargement. No evidence of active pulmonary disease.   EKG Sinus rhythmProbable LVH with secondary repol abnrm   Therapy Recommendations CIR  Physical Exam   GENERAL EXAM: Patient is in no distress  CARDIOVASCULAR: Regular rate and rhythm, no murmurs, no carotid bruits  NEUROLOGIC: MENTAL  STATUS: awake, SLOW RESPONSES. CRANIAL NERVE: pupils equal and reactive to light, visual fields full to confrontation, extraocular muscles intact, no nystagmus, facial sensation and strength symmetric, uvula midline, shoulder shrug symmetric, tongue midline. MOTOR: LUE AND LLE 4, RIGHT SIDE FULL STRENGTH SENSORY: SLIGHTLY DECR ON LEFT SIDE   ASSESSMENT [name] is a 52 y.o. 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 Patient initially underwent placement of right frontal ventriculostomy, due to  continued decreased mental status had to undergo placement of left frontal ventriculostomy with removal of right ventriculostomy. She had intermittent development 07/09/2013 Of a new dependent left ventricular hemorrhage. IVC drainage continues.    Hyperlipidemia, new lipitor, LDL 168 goal < 100 for non diabetics.   Cytotoxic edema, 3% saline discontinued.  Malignant Hypertension. HCTZ and lisinopril 5mg   home med resumed. BP stable  Hypokalemia, resolved  Induced hypernatremia in order to decrease cerebral edema with 3% saline, protocol d/c'd 9/12  Hospital day # 13  TREATMENT/PLAN  No change in neuro care.  SBP goal < 180, at goal.  Ventriculostomy - Dr. Lovell Sheehan, ventriculostomy removed 9/19.  If patient remains medically stable , should be ready from neurological standpoint to transfer to CIR when bed available.  Elspeth Cho, DO Neurology-Stroke

## 2013-07-19 NOTE — Progress Notes (Signed)
PULMONARY  / CRITICAL CARE MEDICINE pcp No PCP Per Patient   Name: Alison Ward MRN: [id number] DOB: 1961/09/27    ADMISSION DATE:  07/06/2013 CONSULTATION DATE:  07/06/2013  REFERRING MD :  Dr. Lavella Lemons PRIMARY SERVICE: CCM  CHIEF COMPLAINT:  AMS  BRIEF PATIENT DESCRIPTION: 8 F with ICH a r/t HTN and elevated ICP.  CULTURES: No results found for this or any previous visit (from the past 240 hour(s)).   ANTIBIOTICS: Cefazolin 9/7 >> 9/12   SIGNIFICANT EVENTS / STUDIES: ICH 07/06/2013 s/p ventric ventric removed and replaced 9/8 Lovell Sheehan) 3% saline started 9/8, stopped 9/10  9/19: Headache much improved.  Drain out  SUBJECTIVE:  07/19/13: Low grade temp - resolved with tylenol. Worked with PT. Up in chair today. Neurosyrgey okayed floor transfer. Drain out.   VITAL SIGNS: Temp:  [98 F (36.7 C)-100.3 F (37.9 C)] 98.5 F (36.9 C) (09/20 0800) Pulse Rate:  [26-87] 71 (09/20 0700) Resp:  [12-25] 14 (09/20 0800) BP: (107-162)/(61-97) 146/86 mmHg (09/20 0800) SpO2:  [84 %-100 %] 97 % (09/20 0800) Weight:  [110.2 kg (242 lb 15.2 oz)] 110.2 kg (242 lb 15.2 oz) (09/20 0500)  HEMODYNAMICS:    VENTILATOR SETTINGS:    INTAKE / OUTPUT: Intake/Output     09/19 0701 - 09/20 0700 09/20 0701 - 09/21 0700   P.O. 912 120   IV Piggyback 150    Total Intake(mL/kg) 1062 (9.6) 120 (1.1)   Drains     Total Output       Net +1062 +120        Urine Occurrence 5 x    Stool Occurrence 1 x      PHYSICAL EXAMINATION: General:  Obese F in NAD Neuro:  Currently sleeping but per neurosurgery today  - Moves all extremities equally. She is awake and alert and conversant  HEENT:  Sclera anicteric, pupil equal and reactive bilaterally, MMM, OP clear, L ventric  Neck:  Trachea supple and midline, (-) JVD, (-) LAN Cardiovascular:  RRR, NS1/S2, (-) MRG Lungs:  resps even non labored on [place], CTAB Abdomen:  S/NT/ND/(+)BS Musculoskeletal:  (-) C/C/E, OA toes biltaerally Skin:   Intact  LABS:   PULMONARY No results found for this basename: PHART, PCO2, PCO2ART, PO2, PO2ART, HCO3, TCO2, O2SAT,  in the last 168 hours  CBC  Recent Labs Lab 07/17/13 0320 07/18/13 0340 07/19/13 0435  HGB 12.6 12.7 11.7*  HCT 38.3 38.3 35.5*  WBC 5.6 5.8 6.9  PLT 183 174 177    COAGULATION No results found for this basename: INR,  in the last 168 hours  CARDIAC  No results found for this basename: TROPONINI,  in the last 168 hours No results found for this basename: PROBNP,  in the last 168 hours   CHEMISTRY  Recent Labs Lab 07/15/13 0419 07/16/13 0410 07/17/13 0320 07/18/13 0340 07/19/13 0435  NA 134* 137 135 138 137  K 3.5 3.7 4.1 3.9 3.8  CL 90* 92* 93* 97 97  CO2 35* 38* 35* 34* 31  GLUCOSE 97 103* 112* 104* 91  BUN 20 19 19 13 14   CREATININE 1.28* 1.30* 1.05 1.05 1.09  CALCIUM 9.5 9.9 9.3 9.3 9.1  MG 2.3 2.4 2.2 2.1 2.0  PHOS 3.9 3.0 3.7 4.2 3.3   Estimated Creatinine Clearance: 74.2 ml/min (by C-G formula based on Cr of 1.09).   LIVER No results found for this basename: AST, ALT, ALKPHOS, BILITOT, PROT, ALBUMIN, INR,  in the last 168 hours  INFECTIOUS No results found for this basename: LATICACIDVEN, PROCALCITON,  in the last 168 hours   ENDOCRINE CBG (last 3)  No results found for this basename: GLUCAP,  in the last 72 hours       IMAGING x48h  No results found.     ASSESSMENT / PLAN: Principal Problem:   Intracerebral hemorrhage Active Problems:   HYPERTENSION   PULMONARY A:  No active issues P:   - She is protecting airway adequately and mental status improved.  CARDIOVASCULAR A:  HTN: Intrinsic + reactive. Goal SBP <= 140.  - Nicardipine Drip off 9/8.  PLAN - restart   lisinopril when appro[priate  RENAL A: ATN resolved 07/17/13  P:   - Monitor BMET   GASTROINTESTINAL A: - Tolerating PO's well.  Continue diet.  HEMATOLOGIC A:   - No Acute Issues. - F/u CBC.  INFECTIOUS A: - Cefazolin 9/7 >  9/12 for ventric prophylaxis, now off.  ENDOCRINE A:   - No Acute Issues.  NEUROLOGIC A:  - Intracerebral Hemorrhage. - Clamped ventric and dc'ed 07/18/13 - S/p 3% saline - stopped 9/10.   07/19/13: Walked with  PT  P:   - Cont Valproate. -  F/u imaging per neurology's plans. - PRN pain rx for headache with tylenol   GLOBAL 07/19/13: Move to neuro floor under triad. PCCM will sign off 07/20/13 7am onwards         Dr. Kalman Shan, M.D., F.C.C.P Pulmonary and Critical Care Medicine Staff Physician [facility] [place] [facility] Pager: [phone], If no answer or between  15:00h - 7:00h: call [phone]  07/19/2013 10:24 AM

## 2013-07-20 LAB — CBC
HCT: 33.7 % — ABNORMAL LOW (ref 36.0–46.0)
MCH: 28.5 pg (ref 26.0–34.0)
MCV: 86.6 fL (ref 78.0–100.0)
Platelets: 185 10*3/uL (ref 150–400)
RDW: 16.1 % — ABNORMAL HIGH (ref 11.5–15.5)
WBC: 6.1 10*3/uL (ref 4.0–10.5)

## 2013-07-20 LAB — BASIC METABOLIC PANEL
BUN: 12 mg/dL (ref 6–23)
Creatinine, Ser: 0.97 mg/dL (ref 0.50–1.10)
GFR calc Af Amer: 75 mL/min — ABNORMAL LOW (ref >90)
GFR calc non Af Amer: 65 mL/min — ABNORMAL LOW (ref >90)
Glucose, Bld: 85 mg/dL (ref 70–99)

## 2013-07-20 MED ORDER — HYDROCHLOROTHIAZIDE 12.5 MG PO CAPS
12.5000 mg | ORAL_CAPSULE | Freq: Every day | ORAL | Status: DC
  Administered 2013-07-20 – 2013-07-21 (×2): 12.5 mg via ORAL
  Filled 2013-07-20 (×2): qty 1

## 2013-07-20 MED ORDER — LISINOPRIL 20 MG PO TABS
20.0000 mg | ORAL_TABLET | Freq: Every day | ORAL | Status: DC
  Administered 2013-07-20 – 2013-07-21 (×2): 20 mg via ORAL
  Filled 2013-07-20 (×2): qty 1

## 2013-07-20 MED ORDER — POTASSIUM CHLORIDE CRYS ER 20 MEQ PO TBCR: 40.0000 meq | EXTENDED_RELEASE_TABLET | Freq: Once | ORAL | Status: DC

## 2013-07-20 NOTE — Progress Notes (Signed)
[facility] Progress Note [place] TEAM 1 - Stepdown/ICU TEAM   Alison Ward NFA:[id number] DOB: 1961-03-05 DOA: 07/06/2013 PCP: No PCP Per Patient  Admit HPI / Brief Narrative: 52 y.o. female with a history of hypertension and poor compliance with taking medication, presenting with confusion and headache as well as lethargy. Patient was confused and called family members to get directions home. A pedestrian called EMS. Emergency services subsequently located family members and brought her to the ED. Blood pressure on arrival in the emergency room was 202/98. CT scan of her head showed a 4.1 x 3.1 cm parietotemporal intracerebral infarction with surrounding edema and intraventricular extension of hemorrhage. Patient had a 6 mm right to left midline shift. There is no previous history of stroke nor TIA. Patient has not been on antiplatelet therapy. NIH stroke score was 20. Patient was not a TPA candidate secondary to ICH She was admitted to the neuro ICU for further evaluation and treatment.  Patient initially had to undergo placement of right frontal ventriculostomy via a burr hole but continued to have decreased mental status and on 07/07/2013 had to undergo placement of left frontal ventriculostomy via a burr hole and removal of right ventriculostomy.   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 Care as per neurology and neurosurgery - awaiting placement in inpatient rehabilitation  Malignant hypertension Blood pressure well controlled at present time - follow and titrate medications as indicated  Hypokalemia - mild Replace and follow - likely due to poor intake  Code Status: FULL Family Communication: Spoke with patient, daughter and mother at bedside Disposition Plan: Transfer to a medical bed -  awaiting placement in inpatient rehabilitation  Consultants: Neurology Neurosurgery  Procedures: 9/7 Placement of right frontal ventriculostomy via a burr hole 9/8 Placement of left frontal ventriculostomy via a burr hole and removal of right ventriculostomy  Antibiotics: Cefazolin 9/7 >> 9/11 + 9/17 >>>  DVT prophylaxis: lovenox  HPI/Subjective: Patient is awake and alert.  She is quite pleasant.  She has no complaints today.  She has only a very mild headache when pressed.  She has begun ambulating about the room.  She is noticing her appetite is improving very nicely.  Objective: Blood pressure 139/80, pulse 82, temperature 97.9 F (36.6 C), temperature source Oral, resp. rate 0, height 5\' 6"  (1.676 m), weight 110.8 kg (244 lb 4.3 oz), last menstrual period 11/24/2011, SpO2 97.00%.  Intake/Output Summary (Last 24 hours) at 07/20/13 1408 Last data filed at 07/20/13 1014  Gross per 24 hour  Intake    960 ml  Output      0 ml  Net    960 ml     Exam: General: No acute respiratory distress Lungs: Clear to auscultation bilaterally without wheezes or crackles Cardiovascular: Regular rate and rhythm without murmur gallop or rub normal S1 and S2 Abdomen: Nontender, nondistended, soft, bowel sounds positive, no rebound, no ascites, no appreciable mass Extremities: No significant cyanosis, clubbing, or edema bilateral lower extremities  Data Reviewed: Basic Metabolic Panel:  Recent Labs Lab 07/16/13 0410 07/17/13 0320 07/18/13 0340 07/19/13 0435 07/20/13 0515  NA 137 135 138 137 138  K 3.7 4.1 3.9 3.8 3.3*  CL 92* 93* 97 97 98  CO2 38* 35* 34* 31 31  GLUCOSE 103* 112* 104* 91 85  BUN 19 19  13 14 12   CREATININE 1.30* 1.05 1.05 1.09 0.97  CALCIUM 9.9 9.3 9.3 9.1 9.1  MG 2.4 2.2 2.1 2.0 2.0  PHOS 3.0 3.7 4.2 3.3 3.5   Liver Function Tests: No results found for this basename: AST, ALT, ALKPHOS, BILITOT, PROT, ALBUMIN,  in the last 168 hours  CBC:  Recent  Labs Lab 07/16/13 0410 07/17/13 0320 07/18/13 0340 07/19/13 0435 07/20/13 0515  WBC 6.5 5.6 5.8 6.9 6.1  HGB 12.9 12.6 12.7 11.7* 11.1*  HCT 39.6 38.3 38.3 35.5* 33.7*  MCV 85.5 85.5 86.1 86.4 86.6  PLT 188 183 174 177 185    Studies:  Recent x-ray studies have been reviewed in detail by the Attending Physician  Scheduled Meds:  Scheduled Meds: . atorvastatin  40 mg Oral q1800  . calcium carbonate  200 mg of elemental calcium Oral BID WC  .  ceFAZolin (ANCEF) IV  2 g Intravenous Q8H  . divalproex  1,000 mg Oral Daily  . enoxaparin (LOVENOX) injection  40 mg Subcutaneous Q24H  . feeding supplement  237 mL Oral BID BM  . pantoprazole  40 mg Oral Daily  . potassium chloride  40 mEq Oral Once  . potassium chloride  40 mEq Oral Once  . senna-docusate  1 tablet Oral BID    Time spent on care of this patient: 35 mins   [facility] T  [facility] Office  [phone] Pager - Text Page per Loretha Stapler as per below:  On-Call/Text Page:      [email or web address]      password [email or web address]  If 7PM-7AM, please contact night-coverage [email or web address] Password [email or web address] 07/20/2013, 2:08 PM   LOS: 14 days

## 2013-07-20 NOTE — Progress Notes (Signed)
Stroke Team Progress Note  HISTORY MALYA CIRILLO is an 52 y.o. female with a history of hypertension and poor compliance with taking medication, presenting with confusion and headache as well as lethargy. Patient was confused and called family members to get directions home. A pedestrian called EMS. Emergency services subsequently located family members and brought her to the ED. Blood pressure on arrival in the emergency room was 202/98. CT scan of her head showed a 4.1 x 3.1 cm parietotemporal intracerebral infarction with surrounding edema and intraventricular extension of hemorrhage. Patient had a 6 mm right to left midline shift. There is no previous history of stroke nor TIA. Patient has not been on antiplatelet therapy. NIH stroke score was 20. Patient was not a TPA candidate secondary to ICH  She was admitted to the neuro ICU for further evaluation and treatment.  Patient initially had to undergo placement of right frontal ventriculostomy via a burr hole but continued to have decreased mental status and on 07/07/2013 had to undergo placement of left frontal ventriculostomy via a burr hole and removal of right ventriculostomy.   SUBJECTIVE Awake, alert. Drain out on 9/19, tolerating well. Remains in Neuro-ICU due to lack of beds. She notes no new complaints. Mild headache, stable compared to yesterday.    OBJECTIVE Most recent Vital Signs: Filed Vitals:   07/20/13 0400 07/20/13 0500 07/20/13 0600 07/20/13 0700  BP: 162/92     Pulse: 71 74 70 70  Temp: 98.2 F (36.8 C)     TempSrc: Oral     Resp: 15 20 18 22   Height:      Weight:  244 lb 4.3 oz (110.8 kg)    SpO2: 99% 96% 99% 100%   CBG (last 3)  No results found for this basename: GLUCAP,  in the last 72 hours  IV Fluid Intake:      MEDICATIONS  . atorvastatin  40 mg Oral q1800  . calcium carbonate  200 mg of elemental calcium Oral BID WC  .  ceFAZolin (ANCEF) IV  2 g Intravenous Q8H  . divalproex  1,000 mg Oral Daily  .  enoxaparin (LOVENOX) injection  40 mg Subcutaneous Q24H  . feeding supplement  237 mL Oral BID BM  . pantoprazole  40 mg Oral Daily  . potassium chloride  40 mEq Oral Once  . potassium chloride  40 mEq Oral Once  . senna-docusate  1 tablet Oral BID   PRN:  acetaminophen, acetaminophen, labetalol, magnesium hydroxide  Diet:  General Activity: bedrest DVT Prophylaxis: lovenox CLINICALLY SIGNIFICANT STUDIES Basic Metabolic Panel:   Recent Labs Lab 07/19/13 0435 07/20/13 0515  NA 137 138  K 3.8 3.3*  CL 97 98  CO2 31 31  GLUCOSE 91 85  BUN 14 12  CREATININE 1.09 0.97  CALCIUM 9.1 9.1  MG 2.0 2.0  PHOS 3.3 3.5   Liver Function Tests:  No results found for this basename: AST, ALT, ALKPHOS, BILITOT, PROT, ALBUMIN,  in the last 168 hours CBC:   Recent Labs Lab 07/19/13 0435 07/20/13 0515  WBC 6.9 6.1  HGB 11.7* 11.1*  HCT 35.5* 33.7*  MCV 86.4 86.6  PLT 177 185   Coagulation:  No results found for this basename: LABPROT, INR,  in the last 168 hours Cardiac Enzymes: No results found for this basename: CKTOTAL, CKMB, CKMBINDEX, TROPONINI,  in the last 168 hours Urinalysis:  No results found for this basename: COLORURINE, APPERANCEUR, LABSPEC, PHURINE, GLUCOSEU, HGBUR, BILIRUBINUR, KETONESUR, PROTEINUR, UROBILINOGEN, NITRITE,  LEUKOCYTESUR,  in the last 168 hours Lipid Panel    Component Value Date/Time   CHOL 244* 07/06/2013 0420   TRIG 153* 07/06/2013 0420   HDL 45 07/06/2013 0420   CHOLHDL 5.4 07/06/2013 0420   VLDL 31 07/06/2013 0420   LDLCALC 168* 07/06/2013 0420   HgbA1C  Lab Results  Component Value Date   HGBA1C 6.0* 07/06/2013    Urine Drug Screen:   No results found for this basename: labopia,  cocainscrnur,  labbenz,  amphetmu,  thcu,  labbarb    Alcohol Level: No results found for this basename: ETH,  in the last 168 hours  Ct Angio Head W/cm &/or Wo Cm 07/07/2013    1.  Minimal distal small vessel disease. 2.  No significant vascular lesion associated with the  area of hemorrhage. 3.  Stable right parietal parenchymal hemorrhage. 4.  Status post right to the tracheostomy placement without evidence for hydrocephalus. 5.  The extent of intraventricular hemorrhage has increased.     CT Head  07/17/2013 Evolving right parietal intraparenchymal hematoma with local mass effect. No new hemorrhage. Degenerating intraventricular blood products, ventriculoperitoneal  shunt in place without hydrocephalous 07/09/2013 Markedly limited exam secondary to motion. There appears to be new dependent left ventricular hemorrhage. Right ventricle hemorrhage and right parietal - posterior temporal hematoma once again noted.  07/06/2013 Large intracerebral hematoma at the posterior right parietotemporal lobe, 4.1 x 3.1 cm in size with surrounding edema and evidence of intraventricular extension of hemorrhage. Question minimal subarachnoid blood at high posterior right parieta region. 6 mm of right-to-left midline shift and diffuse sulcal effacement noted.  MRI of the brain   MRA of the brain    2D Echocardiogram  EF 65%, no regional wall motion abnormality identified, no cardiac source of emboli identified.  Carotid Doppler  Findings suggest 1-39% stenosis bilaterally. Vertebral arteries are patent with antegrade flow.  CXR  07/07/2013   1.  Interval placement of left IJ line, tip overlying the level of the superior vena cava - SVC confluence. 2.  No postprocedure pneumothorax. 3.  Cardiomegaly and vascular congestion.  07/07/2013  Increased pulmonary vascular congestion now with mild pulmonary edema.  Stable cardiomegaly.   07/06/13 Shallow inspiration. Mild cardiac enlargement. No evidence of active pulmonary disease.   EKG Sinus rhythmProbable LVH with secondary repol abnrm   Therapy Recommendations CIR  Physical Exam   GENERAL EXAM: Patient is in no distress  CARDIOVASCULAR: Regular rate and rhythm, no murmurs, no carotid bruits  NEUROLOGIC: MENTAL STATUS: awake, SLOW  RESPONSES. CRANIAL NERVE: pupils equal and reactive to light, visual fields full to confrontation, extraocular muscles intact, no nystagmus, facial sensation and strength symmetric, uvula midline, shoulder shrug symmetric, tongue midline. MOTOR: LUE AND LLE 4, RIGHT SIDE FULL STRENGTH SENSORY: SLIGHTLY DECR ON LEFT SIDE   ASSESSMENT Ms. SHYASIA FUNCHES is a 52 y.o. 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 Patient initially underwent placement of right frontal ventriculostomy, due to  continued decreased mental status had to undergo placement of left frontal ventriculostomy with removal of right ventriculostomy. She had intermittent development 07/09/2013 Of a new dependent left ventricular hemorrhage. IVC drainage continues.    Hyperlipidemia, new lipitor, LDL 168 goal < 100 for non diabetics.   Cytotoxic edema, 3% saline discontinued.  Malignant Hypertension.   Hypokalemia, resolved  Induced  hypernatremia in order to decrease cerebral edema with 3% saline, protocol d/c'd 9/12  Hospital day # 14  TREATMENT/PLAN  No change in neuro care.  SBP goal < 180, at goal. Would monitor and consider re-starting home medications (Hctz and Lisinopril) slowly if beginning to trend up  Ventriculostomy - Dr. Lovell Sheehan, ventriculostomy removed 9/19.  If patient remains medically stable , should be ready from neurological standpoint to transfer to CIR when bed available.  Elspeth Cho, DO Neurology-Stroke

## 2013-07-20 NOTE — Progress Notes (Signed)
eLink Physician-Brief Progress Note Patient Name: Alison Ward DOB: [birth year] MRN: [id number]  Date of Service  07/20/2013   HPI/Events of Note  Hypokalemia   eICU Interventions  Potassium replaced   Intervention Category Minor Interventions: Electrolytes abnormality - evaluation and management  DETERDING,ELIZABETH 07/20/2013, 6:30 AM

## 2013-07-20 NOTE — Progress Notes (Signed)
Patient ID: Alison Ward, female   DOB: [birth year], 52 y.o.   MRN: [id number] Looks great. No headache. Awake and alert and conversant. Moving all extremities.

## 2013-07-21 ENCOUNTER — Inpatient Hospital Stay (HOSPITAL_COMMUNITY)
Admission: RE | Admit: 2013-07-21 | Discharge: 2013-07-29 | DRG: 945 | Disposition: A | Discharge status: Home / Self Care | Payer: Self-pay | Source: Intra-hospital | Attending: Physical Medicine & Rehabilitation | Admitting: Physical Medicine & Rehabilitation

## 2013-07-21 DIAGNOSIS — Z5189 Encounter for other specified aftercare: Principal | ICD-10-CM

## 2013-07-21 DIAGNOSIS — I619 Nontraumatic intracerebral hemorrhage, unspecified: Secondary | ICD-10-CM

## 2013-07-21 DIAGNOSIS — I10 Essential (primary) hypertension: Secondary | ICD-10-CM | POA: Diagnosis present

## 2013-07-21 DIAGNOSIS — E876 Hypokalemia: Secondary | ICD-10-CM

## 2013-07-21 DIAGNOSIS — Z9119 Patient's noncompliance with other medical treatment and regimen: Secondary | ICD-10-CM

## 2013-07-21 DIAGNOSIS — Z91199 Patient's noncompliance with other medical treatment and regimen due to unspecified reason: Secondary | ICD-10-CM

## 2013-07-21 DIAGNOSIS — R4182 Altered mental status, unspecified: Secondary | ICD-10-CM | POA: Diagnosis present

## 2013-07-21 DIAGNOSIS — I161 Hypertensive emergency: Secondary | ICD-10-CM | POA: Diagnosis present

## 2013-07-21 DIAGNOSIS — I6389 Other cerebral infarction: Secondary | ICD-10-CM

## 2013-07-21 LAB — BASIC METABOLIC PANEL
BUN: 10 mg/dL (ref 6–23)
CO2: 30 mEq/L (ref 19–32)
Calcium: 9.1 mg/dL (ref 8.4–10.5)
Chloride: 100 mEq/L (ref 96–112)
Creatinine, Ser: 0.94 mg/dL (ref 0.50–1.10)
GFR calc Af Amer: 78 mL/min — ABNORMAL LOW (ref >90)

## 2013-07-21 LAB — MAGNESIUM: Magnesium: 1.9 mg/dL (ref 1.5–2.5)

## 2013-07-21 MED ORDER — ONDANSETRON HCL 4 MG/2ML IJ SOLN: 4.0000 mg | Freq: Four times a day (QID) | INTRAMUSCULAR | Status: DC | PRN

## 2013-07-21 MED ORDER — CALCIUM CARBONATE ANTACID 500 MG PO CHEW: 1.0000 | CHEWABLE_TABLET | Freq: Two times a day (BID) | ORAL | Status: DC

## 2013-07-21 MED ORDER — LISINOPRIL 20 MG PO TABS
20.0000 mg | ORAL_TABLET | Freq: Every day | ORAL | Status: DC
  Administered 2013-07-22 – 2013-07-29 (×8): 20 mg via ORAL
  Filled 2013-07-21 (×10): qty 1

## 2013-07-21 MED ORDER — ENOXAPARIN SODIUM 40 MG/0.4ML ~~LOC~~ SOLN
40.0000 mg | SUBCUTANEOUS | Status: DC
  Filled 2013-07-21 (×2): qty 0.4

## 2013-07-21 MED ORDER — SORBITOL 70 % SOLN: 30.0000 mL | Freq: Every day | Status: DC | PRN

## 2013-07-21 MED ORDER — DIVALPROEX SODIUM ER 500 MG PO TB24: 1000.0000 mg | ORAL_TABLET | Freq: Every day | ORAL | Status: DC

## 2013-07-21 MED ORDER — PANTOPRAZOLE SODIUM 40 MG PO TBEC
40.0000 mg | DELAYED_RELEASE_TABLET | Freq: Every day | ORAL | Status: DC
  Administered 2013-07-22 – 2013-07-29 (×8): 40 mg via ORAL
  Filled 2013-07-21 (×9): qty 1

## 2013-07-21 MED ORDER — HYDROCHLOROTHIAZIDE 12.5 MG PO CAPS
12.5000 mg | ORAL_CAPSULE | Freq: Every day | ORAL | Status: DC
  Administered 2013-07-22 – 2013-07-29 (×8): 12.5 mg via ORAL
  Filled 2013-07-21 (×10): qty 1

## 2013-07-21 MED ORDER — ENSURE COMPLETE PO LIQD: 237.0000 mL | Freq: Two times a day (BID) | ORAL | Status: DC

## 2013-07-21 MED ORDER — SENNOSIDES-DOCUSATE SODIUM 8.6-50 MG PO TABS: 1.0000 | ORAL_TABLET | Freq: Two times a day (BID) | ORAL | Status: DC

## 2013-07-21 MED ORDER — CALCIUM CARBONATE ANTACID 500 MG PO CHEW
200.0000 mg | CHEWABLE_TABLET | Freq: Two times a day (BID) | ORAL | Status: DC
  Administered 2013-07-22 – 2013-07-29 (×15): 200 mg via ORAL
  Filled 2013-07-21 (×17): qty 1

## 2013-07-21 MED ORDER — ACETAMINOPHEN 325 MG PO TABS
650.0000 mg | ORAL_TABLET | ORAL | Status: DC | PRN
  Administered 2013-07-22 – 2013-07-27 (×7): 650 mg via ORAL
  Filled 2013-07-21 (×7): qty 2

## 2013-07-21 MED ORDER — DIVALPROEX SODIUM ER 500 MG PO TB24
1000.0000 mg | ORAL_TABLET | Freq: Every day | ORAL | Status: DC
  Administered 2013-07-22 – 2013-07-29 (×8): 1000 mg via ORAL
  Filled 2013-07-21 (×10): qty 2

## 2013-07-21 MED ORDER — ENOXAPARIN SODIUM 40 MG/0.4ML ~~LOC~~ SOLN
40.0000 mg | SUBCUTANEOUS | Status: DC
  Administered 2013-07-22 – 2013-07-23 (×2): 40 mg via SUBCUTANEOUS
  Filled 2013-07-21 (×3): qty 0.4

## 2013-07-21 MED ORDER — POTASSIUM CHLORIDE CRYS ER 20 MEQ PO TBCR
40.0000 meq | EXTENDED_RELEASE_TABLET | Freq: Once | ORAL | Status: AC
  Administered 2013-07-21: 40 meq via ORAL
  Filled 2013-07-21: qty 2

## 2013-07-21 MED ORDER — ATORVASTATIN CALCIUM 40 MG PO TABS: 40.0000 mg | ORAL_TABLET | Freq: Every day | ORAL | Status: DC

## 2013-07-21 MED ORDER — ONDANSETRON HCL 4 MG PO TABS: 4.0000 mg | ORAL_TABLET | Freq: Four times a day (QID) | ORAL | Status: DC | PRN

## 2013-07-21 MED ORDER — HYDROCHLOROTHIAZIDE 12.5 MG PO CAPS: 12.5000 mg | ORAL_CAPSULE | Freq: Every day | ORAL | Status: DC

## 2013-07-21 MED ORDER — SENNOSIDES-DOCUSATE SODIUM 8.6-50 MG PO TABS
1.0000 | ORAL_TABLET | Freq: Two times a day (BID) | ORAL | Status: DC
  Administered 2013-07-21 – 2013-07-29 (×15): 1 via ORAL
  Filled 2013-07-21 (×18): qty 1

## 2013-07-21 MED ORDER — POTASSIUM CHLORIDE CRYS ER 20 MEQ PO TBCR: 20.0000 meq | EXTENDED_RELEASE_TABLET | Freq: Every day | ORAL | Status: DC

## 2013-07-21 MED ORDER — ATORVASTATIN CALCIUM 40 MG PO TABS
40.0000 mg | ORAL_TABLET | Freq: Every day | ORAL | Status: DC
  Administered 2013-07-22 – 2013-07-28 (×7): 40 mg via ORAL
  Filled 2013-07-21 (×8): qty 1

## 2013-07-21 MED ORDER — ACETAMINOPHEN 650 MG RE SUPP: 650.0000 mg | RECTAL | Status: DC | PRN

## 2013-07-21 MED ORDER — ENSURE COMPLETE PO LIQD
237.0000 mL | Freq: Two times a day (BID) | ORAL | Status: DC
  Administered 2013-07-23 – 2013-07-29 (×5): 237 mL via ORAL

## 2013-07-21 NOTE — Discharge Summary (Signed)
Physician Discharge Summary  Alison Ward UJW:[id number] DOB: Oct 26, 1961 DOA: 07/06/2013  PCP: No PCP Per Patient  Admit date: 07/06/2013 Discharge date: 07/21/2013  Recommendations for Outpatient Follow-up:  1. Remove Sutures and Staples in a few days  2. Follow up with neurosurgery Dr. Lovell Sheehan 3. Recheck BMP in 3 days to check potassium levels  Discharge Diagnoses:     Intracerebral hemorrhage   HYPERTENSION   ICH (intracerebral hemorrhage)   Hypokalemia  Discharge Condition: stable   Diet recommendation: heart healthy  Filed Weights   07/19/13 0500 07/20/13 0500 07/21/13 0500  Weight: 110.2 kg (242 lb 15.2 oz) 110.8 kg (244 lb 4.3 oz) 109.3 kg (240 lb 15.4 oz)    Admit HPI / Brief Narrative:  53 y.o. female with a history of hypertension and poor compliance with taking medication, presenting with confusion and headache as well as lethargy. Patient was confused and called family members to get directions home. A pedestrian called EMS. Emergency services subsequently located family members and brought her to the ED. Blood pressure on arrival in the emergency room was 202/98. CT scan of her head showed a 4.1 x 3.1 cm parietotemporal intracerebral infarction with surrounding edema and intraventricular extension of hemorrhage. Patient had a 6 mm right to left midline shift. There is no previous history of stroke nor TIA. Patient has not been on antiplatelet therapy. NIH stroke score was 20. Patient was not a TPA candidate secondary to ICH She was admitted to the neuro ICU for further evaluation and treatment. Patient initially had to undergo placement of right frontal ventriculostomy via a burr hole but continued to have decreased mental status and on 07/07/2013 had to undergo placement of left frontal ventriculostomy via a burr hole and removal of right ventriculostomy.   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  Care as per neurology and neurosurgery - for transfer to inpatient rehabilitation today  Malignant hypertension  Blood pressure well controlled at present time - follow and titrate medications as indicated   Hypokalemia - mild  Replace with oral potassium and recommend repeat BMP in 2 or 3 days - likely due to poor intake   Code Status: FULL  Family Communication: Spoke with patient, daughter and mother at bedside  Disposition Plan: Transfer to a medical bed - awaiting placement in inpatient rehabilitation  Consultants:  Neurology  Neurosurgery  Procedures:  9/7 Placement of right frontal ventriculostomy via a burr hole  9/8 Placement of left frontal ventriculostomy via a burr hole and removal of right ventriculostomy  Antibiotics:  Cefazolin 9/7 >> 9/11 + 9/17 >>>  DVT prophylaxis:  lovenox  Discharge Exam: Filed Vitals:   07/21/13 1000  BP: 131/70  Pulse: 77  Temp: 99.2 F (37.3 C)  Resp: 20   General: No acute respiratory distress  Lungs: Clear to auscultation bilaterally without wheezes or crackles  Cardiovascular: Regular rate and rhythm without murmur gallop or rub normal S1 and S2  Abdomen: Nontender, nondistended, soft, bowel sounds positive, no rebound, no ascites, no appreciable mass  Extremities: No significant cyanosis, clubbing, or edema bilateral lower extremities  Discharge Medications    Medication List         atorvastatin 40 MG tablet  Commonly known as:  LIPITOR  Take 1 tablet (40 mg total) by mouth daily at 6 PM.  calcium carbonate 500 MG chewable tablet  Commonly known as:  TUMS - dosed in mg elemental calcium  Chew 1 tablet (200 mg of elemental calcium total) by mouth 2 (two) times daily with a meal.     divalproex 500 MG 24 hr tablet  Commonly known as:  DEPAKOTE ER  Take 2 tablets (1,000 mg total) by mouth daily.      feeding supplement Liqd  Take 237 mLs by mouth 2 (two) times daily between meals.     hydrochlorothiazide 12.5 MG capsule  Commonly known as:  MICROZIDE  Take 1 capsule (12.5 mg total) by mouth daily.     ibuprofen 200 MG tablet  Commonly known as:  ADVIL,MOTRIN  Take 200 mg by mouth every 6 (six) hours as needed for pain.     lisinopril 20 MG tablet  Commonly known as:  PRINIVIL,ZESTRIL  Take 20 mg by mouth daily.     potassium chloride SA 20 MEQ tablet  Commonly known as:  K-DUR,KLOR-CON  Take 1 tablet (20 mEq total) by mouth daily.     senna-docusate 8.6-50 MG per tablet  Commonly known as:  Senokot-S  Take 1 tablet by mouth 2 (two) times daily.       No Known Allergies     Follow-up Information   Follow up with Cristi Loron, MD. Schedule an appointment as soon as possible for a visit in 2 weeks.   Specialty:  Neurosurgery   Contact information:   [address] [place] [address] [phone]      The results of significant diagnostics from this hospitalization (including imaging, microbiology, ancillary and laboratory) are listed below for reference.    Significant Diagnostic Studies: Ct Angio Head W/cm &/or Wo Cm  07/07/2013   *RADIOLOGY REPORT*  Clinical Data:  Intracranial hemorrhage.  CT ANGIOGRAPHY HEAD  Technique:  Multidetector CT imaging of the head was performed using the standard protocol during bolus administration of intravenous contrast.  Multiplanar CT image reconstructions including MIPs were obtained to evaluate the vascular anatomy.  Contrast: 50mL OMNIPAQUE IOHEXOL 350 MG/ML SOLN  Comparison:  CT head without contrast 07/06/2013.  Findings:  A right parietal and temporal lobe hemorrhage is not significantly changed in size surrounding edema is as expected. Intraventricular hemorrhage is more pronounced than on the prior exam.  The right frontal ventriculostomy catheter is now in place. There is minimal  hemorrhage along the shunt site.  Subarachnoid blood over the left parietal convexity is again noted.  There is no significant hydrocephalus.  The paranasal sinuses and mastoid air cells are clear.  The osseous skull is otherwise intact.  The postcontrast images demonstrate no pathologic enhancement.  CTA images demonstrate atherosclerotic calcifications within the cavernous carotid arteries bilaterally.  Enlarged empty sella is noted.  No significant vascular stenoses are evident.  The right A1 segment is dominant.  The anterior communicating artery is patent. The MCA bifurcations are within normal limits bilaterally.  The ACA and MCA branch vessels are normal.  No significant vascular lesions are associated with the area of hemorrhage.  The vertebral arteries are codominant.  The basilar artery is within normal limits.  Prominent posterior communicating arteries are present bilaterally.  P1 segments contribute as well, more equally on the left.  There is some irregularity of distal PCA branch vessels.  The dural sinuses are patent.   Review of the MIP images confirms the above findings.  IMPRESSION:  1.  Minimal distal small vessel disease. 2.  No significant vascular lesion associated with the area of hemorrhage. 3.  Stable right parietal parenchymal hemorrhage. 4.  Status post right to the tracheostomy placement without evidence for hydrocephalus. 5.  The extent of intraventricular hemorrhage has increased.   Original Report Authenticated By: Marin Roberts, M.D.   Ct Head Wo Contrast  07/17/2013   *RADIOLOGY REPORT*  Clinical Data: Follow-up bleed.  Altered mental status.  CT HEAD WITHOUT CONTRAST  Technique:  Contiguous axial images were obtained from the base of the skull through the vertex without contrast.  Comparison: CT of head July 09, 2013 and July 07, 2013.  Findings: Redemonstration of right parietal intraparenchymal hemorrhage, 2.8 x 3.1 cm, with surrounding low density vasogenic  edema. Mild local mass effect without midline shift.  Mild degenerative blood plaques within the occipital horn with residual. intraventricular hematoma within the right lateral ventricle body. No hydrocephalous.  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  Prominent appearance of the right lateral ventricular body choroid plexus may reflect hemorrhage/hematoma. No hydrocephalous.  No new intraparenchymal hemorrhage.  No acute large vascular territory infarcts.  No abnormal extra-axial fluid collections.  Basal cisterns are patent.  Fluid density expanded sella partially imaged may reflect empty sella.  Minimal paranasal sinus mucosal thickening without air fluid levels.  No skull fracture.  Dural calcification along the falx.  Remote high right frontal burr hole.  IMPRESSION: Evolving right parietal intraparenchymal hematoma with local mass effect.  No new hemorrhage.  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   Original Report Authenticated By: Awilda Metro   Ct Head Wo Contrast  07/06/2013   *RADIOLOGY REPORT*  Clinical Data:  Hypertension, severe headache  CT HEAD WITHOUT CONTRAST  Technique:  Contiguous axial images were obtained from the base of the skull through the vertex without contrast.  Comparison: 12/23/2011  Findings: Dilatation of the lateral ventricles. Large intracerebral hematoma identified at the right posterior parietotemporal region, 4.4 x 3.1 cm image 17. Evidence of intraventricular extension of hemorrhage into the right lateral ventricle, third ventricle and minimally into frontal horn of the left lateral ventricle. Edema identified surrounding the intraparenchymal hemorrhage in the right hemisphere. Question small amount of subarachnoid hemorrhage at the high posterior right parietal region.  Mild diffuse effacement of sulci. 6 mm of  right-to-left midline shift. No additional areas of hemorrhage, mass or acute infarction. Scattered falx calcifications. Bones and sinuses unremarkable.  IMPRESSION: Large intracerebral hematoma at the posterior right parietotemporal lobe, 4.1 x 3.1 cm in size with surrounding edema and evidence of intraventricular extension of hemorrhage. Question minimal subarachnoid blood at high posterior right parietal region. 6 mm of right-to-left midline shift and diffuse sulcal effacement noted.  Critical Value/emergent results were called by telephone at the time of interpretation on 07/06/2013 at 0125 hours to Dr. Lavella Lemons, who verbally acknowledged these results.   Original Report Authenticated By: Ulyses Southward, M.D.   Dg Chest Port 1 View  07/07/2013   *RADIOLOGY REPORT*  Clinical Data: Left IJ line placement.  PORTABLE CHEST - 1 VIEW  Comparison: 07/07/2013  Findings: Left IJ line has been placed, tip overlying the level of the brachycephalic vein - SVC confluence.  No evidence for pneumothorax.  Film is made with shallow lung inflation.  Heart is enlarged.  There is mild pulmonary vascular congestion. Degenerative changes are seen in the spine.  IMPRESSION:  1.  Interval placement of left IJ line, tip overlying the level of the superior vena cava - SVC confluence. 2.  No postprocedure pneumothorax. 3.  Cardiomegaly and vascular congestion.   Original Report Authenticated By: Norva Pavlov, M.D.   Dg Chest Port 1 View  07/07/2013   *RADIOLOGY REPORT*  Clinical Data: Pneumonia  PORTABLE CHEST - 1 VIEW  Comparison: Prior chest x-ray 07/06/2013  Findings: Increased pulmonary vascular congestion now with mild interstitial edema.  Stable cardiomegaly.  No focal airspace consolidation.  No pneumothorax.  No acute osseous abnormality.  IMPRESSION:  Increased pulmonary vascular congestion now with mild pulmonary edema.  Stable cardiomegaly.   Original Report Authenticated By: Malachy Moan, M.D.   Dg Chest Port 1  View  07/06/2013   *RADIOLOGY REPORT*  Clinical Data: Headaches.  Concern for bleed.  PORTABLE CHEST - 1 VIEW  Comparison: 06/09/2009  Findings: Shallow inspiration.  Mild cardiac enlargement. Pulmonary vascularity is normal for technique.  No focal consolidation or airspace disease.  No blunting of costophrenic angles.  No pneumothorax.  No significant change since previous study, allowing for technical differences.  IMPRESSION: Shallow inspiration.  Mild cardiac enlargement.  No evidence of active pulmonary disease.   Original Report Authenticated By: Burman Nieves, M.D.   Ct Portable Head W/o Cm  07/09/2013   *RADIOLOGY REPORT*  Clinical Data: Followup intracranial hemorrhage.  CT HEAD WITHOUT CONTRAST  Technique:  Contiguous axial images were obtained from the base of the skull through the vertex without contrast.  Comparison: 07/07/2013.  Findings: Exam was performed portably.  The patient sat up during examination and there is significant motion degradation.  Further imaging was cancelled by Dr. Pearlean Brownie.  Exam is limited with respect to evaluating for any significant change given the degree of motion.  Right parietal - posterior temporal lobe hemorrhage is noted. Blood is seen within the ventricles with shunt catheter in place. When compared to the prior examination, now seen is blood within the left lateral ventricle and not noted on the prior exam.  Limited for evaluating for progressive parenchymal hemorrhage, infarct, evaluating for progressive hydrocephalus or detecting underlying mass.  IMPRESSION: Markedly limited exam secondary to motion.  There appears to be new dependent left ventricular hemorrhage.  Right ventricle hemorrhage and right parietal - posterior temporal hematoma once again noted.   Original Report Authenticated By: Lacy Duverney, M.D.    Microbiology: No results found for this or any previous visit (from the past 240 hour(s)).   Labs: Basic Metabolic Panel:  Recent Labs Lab  07/16/13 0410 07/17/13 0320 07/18/13 0340 07/19/13 0435 07/20/13 0515 07/21/13 0707  NA 137 135 138 137 138 139  K 3.7 4.1 3.9 3.8 3.3* 3.0*  CL 92* 93* 97 97 98 100  CO2 38* 35* 34* 31 31 30   GLUCOSE 103* 112* 104* 91 85 82  BUN 19 19 13 14 12 10   CREATININE 1.30* 1.05 1.05 1.09 0.97 0.94  CALCIUM 9.9 9.3 9.3 9.1 9.1 9.1  MG 2.4 2.2 2.1 2.0 2.0 1.9  PHOS 3.0 3.7 4.2 3.3 3.5  --    Liver Function Tests: No results found for this basename: AST, ALT, ALKPHOS, BILITOT, PROT, ALBUMIN,  in the last 168 hours No results found for this basename: LIPASE, AMYLASE,  in the last 168 hours No results found for this basename: AMMONIA,  in the last 168 hours CBC:  Recent Labs Lab 07/16/13 0410 07/17/13 0320 07/18/13 0340 07/19/13 0435 07/20/13 0515  WBC 6.5 5.6 5.8  6.9 6.1  HGB 12.9 12.6 12.7 11.7* 11.1*  HCT 39.6 38.3 38.3 35.5* 33.7*  MCV 85.5 85.5 86.1 86.4 86.6  PLT 188 183 174 177 185   Cardiac Enzymes: No results found for this basename: CKTOTAL, CKMB, CKMBINDEX, TROPONINI,  in the last 168 hours BNP: BNP (last 3 results) No results found for this basename: PROBNP,  in the last 8760 hours CBG: No results found for this basename: GLUCAP,  in the last 168 hours  I spent 33 mins preparing discharge, reviewing records, consult notes, preparing summary,  Etc.  Signed:  Dominico Rod  [facility] 07/21/2013, 11:07 AM

## 2013-07-21 NOTE — Interval H&P Note (Signed)
Alison Ward was admitted today to Inpatient Rehabilitation with the diagnosis of right temporal-parietal ICH.  The patient's history has been reviewed, patient examined, and there is no change in status.  Patient continues to be appropriate for intensive inpatient rehabilitation.  I have reviewed the patient's chart and labs.  Questions were answered to the patient's satisfaction.  Alison Ward 07/21/2013, 10:45 PM

## 2013-07-21 NOTE — Progress Notes (Signed)
Rehab admissions - I met with patient and her daughter this am.  They are in agreement to inpatient rehab.  Bed available and can admit to inpatient rehab today.  Call me for questions.  #[phone]

## 2013-07-21 NOTE — Progress Notes (Signed)
Patient complains of leg cramps during the night. Said she often gets these cramps at home and wonders if it has to do with her potassium low and if she should start taking potassium supplements. Told her I would make a note for MD to address this upon rounds today. Denies headaches, otherwise no complaints.

## 2013-07-21 NOTE — Progress Notes (Signed)
Physical Therapy Treatment Patient Details Name: Alison Ward MRN: [id number] DOB: 16-Jul-1961 Today's Date: 07/21/2013 Time: 1478-2956 PT Time Calculation (min): 33 min  PT Assessment / Plan / Recommendation  History of Present Illness Alison Ward is an 52 y.o. female with a history of hypertension and poor compliance with taking medication, presenting with confusion and headache as well as lethargy. Patient was confused and called family members to get directions home. She was noted to be outside of the home but unknown resident. A pedestrian called EMS. Emergency services subsequently located family members. Blood pressure on arrival in the emergency room was 202/98. CT scan of her head showed a 4.1 x 3.1 cm parietotemporal intracerebral infarction with surrounding edema and intraventricular extension of hemorrhage. Patient had a 6 mm right to left midline shift. There is no previous history of stroke nor TIA. Patient has not been on antiplatelet therapy. NIH stroke score was 20. 93 F with ICH and elavated ICP   PT Comments   Pt progressing well physically and cognitively, continues to progress towards acute goals but will transition today to CIR. Pt ambulating with RW and min A with multiple LOB but beginning to show improvement in self-correction and attention to surroundings. Should progress very well in inpt rehab setting.    Follow Up Recommendations  CIR;Supervision/Assistance - 24 hour     Does the patient have the potential to tolerate intense rehabilitation     Barriers to Discharge        Equipment Recommendations  None recommended by PT    Recommendations for Other Services Rehab consult  Frequency Min 3X/week   Progress towards PT Goals Progress towards PT goals: Progressing toward goals (pt discharged from acute PT to CIR)  Plan Current plan remains appropriate    Precautions / Restrictions Precautions Precautions: Fall Precaution Comments: ventricular shunt   Restrictions Weight Bearing Restrictions: No   Pertinent Vitals/Pain No c/o pain    Mobility  Bed Mobility Bed Mobility: Not assessed Transfers Transfers: Sit to Stand;Stand to Sit Sit to Stand: 4: Min assist;From chair/3-in-1 Stand to Sit: 4: Min guard;To chair/3-in-1 Details for Transfer Assistance: min A with sit to stand, to steady. vc's for hand placement and safety awareness. With stand to sit, pt did not align self properly with chair but was able to correct before sitting with vc's. Also showed improved STM carryover, remembering proper hand placement cues from sit to stand for stand to sit.  Ambulation/Gait Ambulation/Gait Assistance: 4: Min assist Ambulation Distance (Feet): 100 Feet Assistive device: Rolling walker Ambulation/Gait Assistance Details: pt had multiple LOB with min A to correct. Needed tactile cues at first to steer RW and frequently hit objects on left side. However, this improved to her being able to correct with only vc's. Had difficulty with turning RW and all processing slow. No scissoring today. Gait Pattern: Decreased stride length;Ataxic Gait velocity: decreased Stairs: No Wheelchair Mobility Wheelchair Mobility: No Modified Rankin (Stroke Patients Only) Pre-Morbid Rankin Score: No symptoms Modified Rankin: Moderately severe disability    Exercises Other Exercises Other Exercises: performed balance activities in standing with RW around her and bed behind   PT Diagnosis:    PT Problem List:   PT Treatment Interventions:     PT Goals (current goals can now be found in the care plan section) Acute Rehab PT Goals Patient Stated Goal: return to independence and spending time with grandchildren PT Goal Formulation: Patient unable to participate in goal setting Time For Goal Achievement:  07/21/13 Potential to Achieve Goals: Good  Visit Information  Last PT Received On: 07/21/13 Assistance Needed: +1 History of Present Illness: Alison Ward  is an 52 y.o. female with a history of hypertension and poor compliance with taking medication, presenting with confusion and headache as well as lethargy. Patient was confused and called family members to get directions home. She was noted to be outside of the home but unknown resident. A pedestrian called EMS. Emergency services subsequently located family members. Blood pressure on arrival in the emergency room was 202/98. CT scan of her head showed a 4.1 x 3.1 cm parietotemporal intracerebral infarction with surrounding edema and intraventricular extension of hemorrhage. Patient had a 6 mm right to left midline shift. There is no previous history of stroke nor TIA. Patient has not been on antiplatelet therapy. NIH stroke score was 20. 33 F with ICH and elavated ICP    Subjective Data  Subjective: pt feeling encouraged by improvement and excited to go to CIR Patient Stated Goal: return to independence and spending time with grandchildren   Cognition  Cognition Arousal/Alertness: Awake/alert Behavior During Therapy: WFL for tasks assessed/performed Overall Cognitive Status: Impaired/Different from baseline Area of Impairment: Attention;Following commands;Safety/judgement;Problem solving Current Attention Level: Selective Memory: Decreased recall of precautions;Decreased short-term memory Following Commands: Follows one step commands consistently;Follows multi-step commands with increased time Safety/Judgement: Decreased awareness of safety;Decreased awareness of deficits Awareness: Emergent Problem Solving: Slow processing;Difficulty sequencing;Requires verbal cues;Requires tactile cues General Comments: pt's awareness is improving, continues to process slowly but this is improving as well. Worked a lot on Engineer, building services today and pt showed improvement throughout session    Balance  Balance Balance Assessed: Yes Static Sitting Balance Static Sitting - Balance Support: No upper extremity  supported;Feet supported Static Sitting - Level of Assistance: 6: Modified independent (Device/Increase time) Dynamic Standing Balance Dynamic Standing - Balance Support: No upper extremity supported;During functional activity Dynamic Standing - Level of Assistance: 4: Min assist  End of Session PT - End of Session Equipment Utilized During Treatment: Gait belt Activity Tolerance: Patient tolerated treatment well Patient left: in chair;with call bell/phone within reach;with family/visitor present Nurse Communication: Mobility status   GP    Lyanne Co, PT  Acute Rehab Services  [phone]  Lyanne Co 07/21/2013, 1:34 PM

## 2013-07-21 NOTE — H&P (Addendum)
Physical Medicine and Rehabilitation Admission H&P    Chief Complaint  Patient presents with  . Headache  . Hypertension  : HPI: Alison Ward is a 52 y.o. right-handed African American female with history of hypertension and poor medical compliance. Admitted 07/06/2013 with headache as well as lethargy. Blood pressure 202/98 on admission through the emergency room. 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 Underwent placement of right frontal ventriculostomy via burr hole 07/06/2013 per Dr. Lovell Sheehan. Carotid Dopplers with no ICA stenosis. Echocardiogram with ejection fraction of 70% and grade 1 diastolic dysfunction. Patient did not receive TPA secondary to ICH. Placed on Cardene drip for blood pressure control. Followup neurosurgery with findings of occluded ventriculostomy vessel underwent placement of left ventriculostomy and removal of right ventriculostomy 07/07/2013. On 07/18/2013 ventriculostomy was clamped and removed without difficulty. Latest followup cranial CT scan with no new hemorrhage. Maintained on depakote for seizure prophylaxis. Subcutaneous Lovenox was initiated 07/14/2013 for DVT prophylaxis. Patient is presently maintained on a regular diet. Physical and occupational therapy evaluations completed with recommendations for physical medicine rehabilitation consult to consider inpatient rehabilitation services. Patient was felt to be a good candidate for inpatient rehabilitation services was admitted for comprehensive rehabilitation program today  Review of Systems  Neurological: Positive for dizziness and headaches.  All other systems reviewed and are negative   Past Medical History  Diagnosis Date  . Hypertension    Past Surgical History  Procedure Laterality Date  . Ventriculostomy Left 07/07/2013     Procedure: VENTRICULOSTOMY;  Surgeon: Cristi Loron, MD;  Location: [facility];  Service: Neurosurgery;  Laterality: Left;  Left Ventriculostomy and Removal of Right Vetriculostomy   Family History  Problem Relation Age of Onset  . Hypertension Mother   . Hypertension Father   . Diabetes Daughter    Social History:  reports that she has never smoked. She has never used smokeless tobacco. She reports that she does not drink alcohol or use illicit drugs. Allergies: No Known Allergies Medications Prior to Admission  Medication Sig Dispense Refill  . HYDROCHLOROTHIAZIDE PO Take 1 tablet by mouth daily.      Marland Kitchen ibuprofen (ADVIL,MOTRIN) 200 MG tablet Take 200 mg by mouth every 6 (six) hours as needed for pain.      Marland Kitchen lisinopril (PRINIVIL,ZESTRIL) 20 MG tablet Take 20 mg by mouth daily.        Home: Home Living Family/patient expects to be discharged to:: Private residence Living Arrangements: Alone Available Help at Discharge: Family;Available 24 hours/day Type of Home: Apartment Home Access: Stairs to enter Entrance Stairs-Number of Steps: 3 down with right rail and then 6 with both rails Entrance Stairs-Rails:  (see above) Home Layout: One level Home Equipment: None Additional Comments: administrative job at work   Functional History: Prior Function Comments: tub with curtain and standard toilet  Functional Status:  Mobility: Bed Mobility Bed Mobility: Supine to Sit;Sitting - Scoot to [place] of Bed Rolling Left: Not tested (comment) Rolling Left: Patient Percentage: 50% Left Sidelying to Sit: 1: +2 Total assist;HOB elevated Left Sidelying to Sit: Patient Percentage: 50% Supine to Sit: 3: Mod assist;HOB elevated Supine to Sit: Patient Percentage: 10% Sitting - Scoot to Edge of Bed: 3: Mod assist Sitting - Scoot to Edge of Bed: Patient Percentage: 10% Sit to Supine: 1: +2 Total assist Sit to Supine:  Patient Percentage: 50% Scooting to [facility]: Not tested (comment) Scooting  to [facility]: Patient Percentage: 0% Transfers Transfers: Sit to Stand;Stand to Sit;Stand Pivot Transfers Sit to Stand: 1: +2 Total assist;With upper extremity assist;From bed Sit to Stand: Patient Percentage: 70% Stand to Sit: 1: +2 Total assist;Without upper extremity assist;To chair/3-in-1 Stand to Sit: Patient Percentage: 70% Stand Pivot Transfers: 1: +2 Total assist Stand Pivot Transfers: Patient Percentage: 70% Lateral/Scoot Transfers: Not tested (comment) Lateral Transfers: Patient Percentage: 50% Ambulation/Gait Ambulation/Gait Assistance: 1: +2 Total assist Ambulation/Gait: Patient Percentage: 70% Ambulation Distance (Feet): 3 Feet Assistive device: Rolling walker Ambulation/Gait Assistance Details: Pt had a lot of difficulty following commands with stepping with RW.  Pt scissoring while trying to step around.   Gait Pattern: Step-to pattern;Decreased stride length;Ataxic;Scissoring;Wide base of support Stairs: No Wheelchair Mobility Wheelchair Mobility: No  ADL: ADL Eating/Feeding: NPO Where Assessed - Eating/Feeding: Edge of bed Grooming: Moderate assistance Where Assessed - Grooming: Unsupported sitting Upper Body Bathing: Maximal assistance Where Assessed - Upper Body Bathing: Supported sitting Lower Body Bathing: +1 Total assistance Where Assessed - Lower Body Bathing: Supported sit to stand Upper Body Dressing: +1 Total assistance Where Assessed - Upper Body Dressing: Unsupported sitting Lower Body Dressing: +1 Total assistance Where Assessed - Lower Body Dressing: Supported sit to Pharmacist, hospital: +2 Total assistance Toilet Transfer Method: Surveyor, minerals:  (Bed to recliner on her left) Transfers/Ambulation Related to ADLs: total A +2 (pt=60%) sit>stand; (30%) stand >sit ADL Comments: Worked on trying to assess vison more while she was seated EOB.  Pt would track to the right and the close her eyes; could not get her to track to the left.  Pt reports that she feels dizzy (PT thinks she has some vestibular issues)  Cognition: Cognition Overall Cognitive Status: Impaired/Different from baseline Arousal/Alertness: Awake/alert Orientation Level: Oriented to person;Oriented to place;Oriented to situation;Disoriented to time Attention: Sustained Sustained Attention: Impaired Sustained Attention Impairment: Verbal basic;Functional basic Awareness: Impaired Awareness Impairment: Intellectual impairment Problem Solving: Impaired Problem Solving Impairment: Verbal basic;Functional basic Cognition Arousal/Alertness: Awake/alert Behavior During Therapy: WFL for tasks assessed/performed Overall Cognitive Status: Impaired/Different from baseline Area of Impairment: Attention;Following commands;Safety/judgement;Problem solving Orientation Level: Place;Time;Situation Current Attention Level: Sustained Memory: Decreased recall of precautions;Decreased short-term memory Following Commands: Follows one step commands inconsistently;Follows one step commands with increased time Safety/Judgement: Decreased awareness of safety;Decreased awareness of deficits Awareness: Intellectual Problem Solving: Slow processing;Decreased initiation;Difficulty sequencing;Requires verbal cues;Requires tactile cues General Comments: Pt continues to have slow processing of commands.   Difficult to assess due to: Level of arousal  Physical Exam: Blood pressure 139/86, pulse 76, temperature 98.5 F (36.9 C), temperature source Oral, resp. rate 20, height 5\' 6"  (1.676 m), weight 109.3 kg (240 lb 15.4 oz), last menstrual period 11/24/2011, SpO2 95.00%. Constitutional: She is oriented to person, place, and time. She appears well-developed.  Eyes: EOM are normal.  Neck: Normal range of motion. Neck supple. No thyromegaly present.  Cardiovascular: Normal rate and regular rhythm.  Pulmonary/Chest: Effort normal and breath sounds normal. No respiratory distress.   Abdominal: Soft. Bowel sounds are normal. She exhibits no distension.  Neurological: She is alert and oriented to person, place, and time.  Follows simple commands. Improved processing. Oriented to place, name. Reviewed basic biographical information. Left inattention and HH is persistent but inattention is better. Left UE grossly 4-/5. LLE is 4+ to 4-/5.  Intact touch to pain and general touch. Left central 7 is mild now. Had some difficulties when multiple  commands were presented and would lose focus and attention.   Skin: Drain site clean and dry  Psychiatric: She has a normal mood and affect. Her behavior is normal   Results for orders placed during the hospital encounter of 07/06/13 (from the past 48 hour(s))  BASIC METABOLIC PANEL     Status: Abnormal   Collection Time    07/20/13  5:15 AM      Result Value Range   Sodium 138  135 - 145 mEq/L   Potassium 3.3 (*) 3.5 - 5.1 mEq/L   Chloride 98  96 - 112 mEq/L   CO2 31  19 - 32 mEq/L   Glucose, Bld 85  70 - 99 mg/dL   BUN 12  6 - 23 mg/dL   Creatinine, Ser 1.61  0.50 - 1.10 mg/dL   Calcium 9.1  8.4 - 09.6 mg/dL   GFR calc non Af Amer 65 (*) >90 mL/min   GFR calc Af Amer 75 (*) >90 mL/min   Comment: (NOTE)     The eGFR has been calculated using the CKD EPI equation.     This calculation has not been validated in all clinical situations.     eGFR's persistently <90 mL/min signify possible Chronic Kidney     Disease.  PHOSPHORUS     Status: None   Collection Time    07/20/13  5:15 AM      Result Value Range   Phosphorus 3.5  2.3 - 4.6 mg/dL  MAGNESIUM     Status: None   Collection Time    07/20/13  5:15 AM      Result Value Range   Magnesium 2.0  1.5 - 2.5 mg/dL  CBC     Status: Abnormal   Collection Time    07/20/13  5:15 AM      Result Value Range   WBC 6.1  4.0 - 10.5 K/uL   RBC 3.89  3.87 - 5.11 MIL/uL   Hemoglobin 11.1 (*) 12.0 - 15.0 g/dL   HCT 04.5 (*) 40.9 - 81.1 %   MCV 86.6  78.0 - 100.0 fL   MCH 28.5  26.0 -  34.0 pg   MCHC 32.9  30.0 - 36.0 g/dL   RDW 91.4 (*) 78.2 - 95.6 %   Platelets 185  150 - 400 K/uL  BASIC METABOLIC PANEL     Status: Abnormal   Collection Time    07/21/13  7:07 AM      Result Value Range   Sodium 139  135 - 145 mEq/L   Potassium 3.0 (*) 3.5 - 5.1 mEq/L   Chloride 100  96 - 112 mEq/L   CO2 30  19 - 32 mEq/L   Glucose, Bld 82  70 - 99 mg/dL   BUN 10  6 - 23 mg/dL   Creatinine, Ser 2.13  0.50 - 1.10 mg/dL   Calcium 9.1  8.4 - 08.6 mg/dL   GFR calc non Af Amer 68 (*) >90 mL/min   GFR calc Af Amer 78 (*) >90 mL/min   Comment: (NOTE)     The eGFR has been calculated using the CKD EPI equation.     This calculation has not been validated in all clinical situations.     eGFR's persistently <90 mL/min signify possible Chronic Kidney     Disease.  MAGNESIUM     Status: None   Collection Time    07/21/13  7:07 AM      Result Value  Range   Magnesium 1.9  1.5 - 2.5 mg/dL   No results found.  Post Admission Physician Evaluation: 1. Functional deficits secondary  to right temporal-parietal ICH. 2. Patient is admitted to receive collaborative, interdisciplinary care between the physiatrist, rehab nursing staff, and therapy team. 3. Patient's level of medical complexity and substantial therapy needs in context of that medical necessity cannot be provided at a lesser intensity of care such as a SNF. 4. Patient has experienced substantial functional loss from his/her baseline which was documented above under the "Functional History" and "Functional Status" headings.  Judging by the patient's diagnosis, physical exam, and functional history, the patient has potential for functional progress which will result in measurable gains while on inpatient rehab.  These gains will be of substantial and practical use upon discharge  in facilitating mobility and self-care at the household level. 5. Physiatrist will provide 24 hour management of medical needs as well as oversight of the therapy  plan/treatment and provide guidance as appropriate regarding the interaction of the two. 6. 24 hour rehab nursing will assist with bladder management, bowel management, safety, skin/wound care, disease management, medication administration, pain management and patient education  and help integrate therapy concepts, techniques,education, etc. 7. PT will assess and treat for/with: Lower extremity strength, range of motion, stamina, balance, functional mobility, safety, adaptive techniques and equipment, NMR, visual perceptual rx.   Goals are: supervision. 8. OT will assess and treat for/with: ADL's, functional mobility, safety, upper extremity strength, adaptive techniques and equipment, NMR, cognitive perceptual and visual perceptual rx.   Goals are: supervision to minimal assist. 9. SLP will assess and treat for/with: cognition, communication.  Goals are: supervision. 10. Case Management and Social Worker will assess and treat for psychological issues and discharge planning. 11. Team conference will be held weekly to assess progress toward goals and to determine barriers to discharge. 12. Patient will receive at least 3 hours of therapy per day at least 5 days per week. 13. ELOS: 2 weeks       14. Prognosis:  excellent   Medical Problem List and Plan: 1. Right temporoparietal ICH. Status post burr hole ventriculostomy 2. DVT Prophylaxis/Anticoagulation: Subcutaneous Lovenox initiated 07/14/2013. Monitor platelet counts any signs of bleeding. Check vascular study 3. Pain Management: Tylenol as needed 4. Neuropsych: This patient is capable of making decisions on her own behalf. 5. Seizure prophylaxis. Depakote ER 1000 mg daily. Monitor for a seizure activity 6. Hypertension. Hydrochlorothiazide 12.5 mg daily, lisinopril 20 mg daily. Monitor with increased mobility 7. History of medical noncompliance. Counseling    Ranelle Oyster, MD, [facility] Physical Medicine &  Rehabilitation  07/21/2013

## 2013-07-21 NOTE — Progress Notes (Signed)
Stroke Team Progress Note  HISTORY COZY Alison Ward is an 52 y.o. female with a history of hypertension and poor compliance with taking medication, presenting with confusion and headache as well as lethargy. Patient was confused and called family members to get directions home. A pedestrian called EMS. Emergency services subsequently located family members and brought her to the ED. Blood pressure on arrival in the emergency room was 202/98. CT scan of her head showed a 4.1 x 3.1 cm parietotemporal intracerebral infarction with surrounding edema and intraventricular extension of hemorrhage. Patient had a 6 mm right to left midline shift. There is no previous history of stroke nor TIA. Patient has not been on antiplatelet therapy. NIH stroke score was 20. Patient was not a TPA candidate secondary to ICH  She was admitted to the neuro ICU for further evaluation and treatment.  Patient initially had to undergo placement of right frontal ventriculostomy via a burr hole but continued to have decreased mental status and on 07/07/2013 had to undergo placement of left frontal ventriculostomy via a burr hole and removal of right ventriculostomy.   SUBJECTIVE  Patient awake and alert. Says she is feeling better progressively. Family in room.  Very supportive    OBJECTIVE Most recent Vital Signs: Filed Vitals:   07/21/13 0234 07/21/13 0500 07/21/13 0549 07/21/13 1000  BP: 145/91  139/86 131/70  Pulse: 78  76 77  Temp: 99.6 F (37.6 C)  98.5 F (36.9 C) 99.2 F (37.3 C)  TempSrc: Oral  Oral Oral  Resp: 20  20 20   Height:      Weight:  109.3 kg (240 lb 15.4 oz)    SpO2: 100%  95% 100%   CBG (last 3)  No results found for this basename: GLUCAP,  in the last 72 hours  IV Fluid Intake:      MEDICATIONS  . atorvastatin  40 mg Oral q1800  . calcium carbonate  200 mg of elemental calcium Oral BID WC  .  ceFAZolin (ANCEF) IV  2 g Intravenous Q8H  . divalproex  1,000 mg Oral Daily  . enoxaparin  (LOVENOX) injection  40 mg Subcutaneous Q24H  . feeding supplement  237 mL Oral BID BM  . hydrochlorothiazide  12.5 mg Oral Daily  . lisinopril  20 mg Oral Daily  . pantoprazole  40 mg Oral Daily  . potassium chloride  40 mEq Oral Once  . potassium chloride  40 mEq Oral Once  . senna-docusate  1 tablet Oral BID   PRN:  acetaminophen, acetaminophen, labetalol, magnesium hydroxide  Diet:  General Activity: bedrest DVT Prophylaxis: lovenox CLINICALLY SIGNIFICANT STUDIES Basic Metabolic Panel:   Recent Labs Lab 07/19/13 0435 07/20/13 0515 07/21/13 0707  NA 137 138 139  K 3.8 3.3* 3.0*  CL 97 98 100  CO2 31 31 30   GLUCOSE 91 85 82  BUN 14 12 10   CREATININE 1.09 0.97 0.94  CALCIUM 9.1 9.1 9.1  MG 2.0 2.0 1.9  PHOS 3.3 3.5  --    Liver Function Tests:  No results found for this basename: AST, ALT, ALKPHOS, BILITOT, PROT, ALBUMIN,  in the last 168 hours CBC:   Recent Labs Lab 07/19/13 0435 07/20/13 0515  WBC 6.9 6.1  HGB 11.7* 11.1*  HCT 35.5* 33.7*  MCV 86.4 86.6  PLT 177 185   Coagulation:  No results found for this basename: LABPROT, INR,  in the last 168 hours Cardiac Enzymes: No results found for this basename: CKTOTAL, CKMB, CKMBINDEX,  TROPONINI,  in the last 168 hours Urinalysis:  No results found for this basename: COLORURINE, APPERANCEUR, LABSPEC, PHURINE, GLUCOSEU, HGBUR, BILIRUBINUR, KETONESUR, PROTEINUR, UROBILINOGEN, NITRITE, LEUKOCYTESUR,  in the last 168 hours Lipid Panel    Component Value Date/Time   CHOL 244* 07/06/2013 0420   TRIG 153* 07/06/2013 0420   HDL 45 07/06/2013 0420   CHOLHDL 5.4 07/06/2013 0420   VLDL 31 07/06/2013 0420   LDLCALC 168* 07/06/2013 0420   HgbA1C  Lab Results  Component Value Date   HGBA1C 6.0* 07/06/2013    Urine Drug Screen:   No results found for this basename: labopia,  cocainscrnur,  labbenz,  amphetmu,  thcu,  labbarb    Alcohol Level: No results found for this basename: ETH,  in the last 168 hours  Ct Angio Head W/cm  &/or Wo Cm 07/07/2013    1.  Minimal distal small vessel disease. 2.  No significant vascular lesion associated with the area of hemorrhage. 3.  Stable right parietal parenchymal hemorrhage. 4.  Status post right to the tracheostomy placement without evidence for hydrocephalus. 5.  The extent of intraventricular hemorrhage has increased.     CT Head  07/17/2013 Evolving right parietal intraparenchymal hematoma with local mass effect. No new hemorrhage. Degenerating intraventricular blood products, ventriculoperitoneal  shunt in place without hydrocephalous 07/09/2013 Markedly limited exam secondary to motion. There appears to be new dependent left ventricular hemorrhage. Right ventricle hemorrhage and right parietal - posterior temporal hematoma once again noted.  07/06/2013 Large intracerebral hematoma at the posterior right parietotemporal lobe, 4.1 x 3.1 cm in size with surrounding edema and evidence of intraventricular extension of hemorrhage. Question minimal subarachnoid blood at high posterior right parieta region. 6 mm of right-to-left midline shift and diffuse sulcal effacement noted.  MRI of the brain   MRA of the brain    2D Echocardiogram  EF 65%, no regional wall motion abnormality identified, no cardiac source of emboli identified.  Carotid Doppler  Findings suggest 1-39% stenosis bilaterally. Vertebral arteries are patent with antegrade flow.  CXR  07/07/2013   1.  Interval placement of left IJ line, tip overlying the level of the superior vena cava - SVC confluence. 2.  No postprocedure pneumothorax. 3.  Cardiomegaly and vascular congestion.  07/07/2013  Increased pulmonary vascular congestion now with mild pulmonary edema.  Stable cardiomegaly.   07/06/13 Shallow inspiration. Mild cardiac enlargement. No evidence of active pulmonary disease.   EKG Sinus rhythmProbable LVH with secondary repol abnrm   Therapy Recommendations CIR  Physical Exam   GENERAL EXAM: Patient is in no  distress  CARDIOVASCULAR: Regular rate and rhythm, no murmurs, no carotid bruits  NEUROLOGIC: MENTAL STATUS: awake, SLOW RESPONSES. CRANIAL NERVE: pupils equal and reactive to light, visual fields full to confrontation, extraocular muscles intact, no nystagmus, facial sensation and strength symmetric, uvula midline, shoulder shrug symmetric, tongue midline. MOTOR: LUE AND LLE 4, RIGHT SIDE FULL STRENGTH SENSORY: SLIGHTLY DECR ON LEFT SIDE   ASSESSMENT Ms. GIRL SCHISSLER is a 52 y.o. 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 Patient initially underwent placement of right frontal ventriculostomy, due to  continued decreased mental status had to undergo placement of left frontal ventriculostomy with removal of right ventriculostomy. She had intermittent development 07/09/2013 Of a new dependent left ventricular hemorrhage. IVC drain placed. Now all drains out.  Plans are for rehab.   Hyperlipidemia, new lipitor, LDL 168 goal < 100 for non diabetics.   Cytotoxic edema, 3% saline discontinued.  Malignant Hypertension. HCTZ home med resumed. Added Lisinopril 5mg  daily.  Hypokalemia, resolved  Induced hypernatremia in order to decrease cerebral edema with 3% saline, protocol d/c'd 9/12  Hospital day # 15  TREATMENT/PLAN  No change in neuro care.  SBP goal < 180, at goal.  Ventriculostomy - Dr. Lovell Sheehan, ventriculostomy removed 07/18/2013  Stroke service will sign off  Have patient follow up with Dr. Pearlean Brownie in office in 1 months.  Gwendolyn Lima. Manson Passey, [facility], MBA, MHA [facility] Pager: [phone] 07/21/2013 11:44 AM  I have personally obtained a history, examined the patient, evaluated imaging results, and formulated the assessment and plan of care. I  agree with the above. Delia Heady, MD

## 2013-07-21 NOTE — Progress Notes (Signed)
Patient ID: Alison Ward, female   DOB: Nov 20, 1960, 52 y.o.   MRN: [id number] Subjective:  The patient is alert and pleasant. She looks well. She denies headaches.  Objective: Vital signs in last 24 hours: Temp:  [97.8 F (36.6 C)-100.2 F (37.9 C)] 98.5 F (36.9 C) (09/22 0549) Pulse Rate:  [69-87] 76 (09/22 0549) Resp:  [0-22] 20 (09/22 0549) BP: (134-162)/(77-91) 139/86 mmHg (09/22 0549) SpO2:  [95 %-100 %] 95 % (09/22 0549) Weight:  [109.3 kg (240 lb 15.4 oz)] 109.3 kg (240 lb 15.4 oz) (09/22 0500)  Intake/Output from previous day: 09/21 0701 - 09/22 0700 In: 50 [IV Piggyback:50] Out: -  Intake/Output this shift:    Physical exam the patient is alert and oriented x3. Her pupils were equal. Her speech is normal. Her strength is normal. Her wounds are healing well without signs of infection.  Lab Results:  Recent Labs  07/19/13 0435 07/20/13 0515  WBC 6.9 6.1  HGB 11.7* 11.1*  HCT 35.5* 33.7*  PLT 177 185   BMET  Recent Labs  07/19/13 0435 07/20/13 0515  NA 137 138  K 3.8 3.3*  CL 97 98  CO2 31 31  GLUCOSE 91 85  BUN 14 12  CREATININE 1.09 0.97  CALCIUM 9.1 9.1    Studies/Results: No results found.  Assessment/Plan: Right intracerebral hemorrhage, interventricular hemorrhage: The patient is doing very well and making a good recovery. She is ready for rehabilitation from my point of view. She will need to have her staples and sutures removed in a few days.  LOS: 15 days     Arvine Clayburn D 07/21/2013, 7:44 AM

## 2013-07-21 NOTE — H&P (View-Only) (Signed)
Physical Medicine and Rehabilitation Admission H&P    Chief Complaint  Patient presents with  . Headache  . Hypertension  : HPI: Alison Ward is a 52 y.o. right-handed African American female with history of hypertension and poor medical compliance. Admitted 07/06/2013 with headache as well as lethargy. Blood pressure 202/98 on admission through the emergency room. 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 Underwent placement of right frontal ventriculostomy via burr hole 07/06/2013 per Dr. Jenkins. Carotid Dopplers with no ICA stenosis. Echocardiogram with ejection fraction of 70% and grade 1 diastolic dysfunction. Patient did not receive TPA secondary to ICH. Placed on Cardene drip for blood pressure control. Followup neurosurgery with findings of occluded ventriculostomy vessel underwent placement of left ventriculostomy and removal of right ventriculostomy 07/07/2013. On 07/18/2013 ventriculostomy was clamped and removed without difficulty. Latest followup cranial CT scan with no new hemorrhage. Maintained on depakote for seizure prophylaxis. Subcutaneous Lovenox was initiated 07/14/2013 for DVT prophylaxis. Patient is presently maintained on a regular diet. Physical and occupational therapy evaluations completed with recommendations for physical medicine rehabilitation consult to consider inpatient rehabilitation services. Patient was felt to be a good candidate for inpatient rehabilitation services was admitted for comprehensive rehabilitation program today  Review of Systems  Neurological: Positive for dizziness and headaches.  All other systems reviewed and are negative   Past Medical History  Diagnosis Date  . Hypertension    Past Surgical History  Procedure Laterality Date  . Ventriculostomy Left 07/07/2013     Procedure: VENTRICULOSTOMY;  Surgeon: Jeffrey D Jenkins, MD;  Location: [facility];  Service: Neurosurgery;  Laterality: Left;  Left Ventriculostomy and Removal of Right Vetriculostomy   Family History  Problem Relation Age of Onset  . Hypertension Mother   . Hypertension Father   . Diabetes Daughter    Social History:  reports that she has never smoked. She has never used smokeless tobacco. She reports that she does not drink alcohol or use illicit drugs. Allergies: No Known Allergies Medications Prior to Admission  Medication Sig Dispense Refill  . HYDROCHLOROTHIAZIDE PO Take 1 tablet by mouth daily.      . ibuprofen (ADVIL,MOTRIN) 200 MG tablet Take 200 mg by mouth every 6 (six) hours as needed for pain.      . lisinopril (PRINIVIL,ZESTRIL) 20 MG tablet Take 20 mg by mouth daily.        Home: Home Living Family/patient expects to be discharged to:: Private residence Living Arrangements: Alone Available Help at Discharge: Family;Available 24 hours/day Type of Home: Apartment Home Access: Stairs to enter Entrance Stairs-Number of Steps: 3 down with right rail and then 6 with both rails Entrance Stairs-Rails:  (see above) Home Layout: One level Home Equipment: None Additional Comments: administrative job at work   Functional History: Prior Function Comments: tub with curtain and standard toilet  Functional Status:  Mobility: Bed Mobility Bed Mobility: Supine to Sit;Sitting - Scoot to Edge of Bed Rolling Left: Not tested (comment) Rolling Left: Patient Percentage: 50% Left Sidelying to Sit: 1: +2 Total assist;HOB elevated Left Sidelying to Sit: Patient Percentage: 50% Supine to Sit: 3: Mod assist;HOB elevated Supine to Sit: Patient Percentage: 10% Sitting - Scoot to Edge of Bed: 3: Mod assist Sitting - Scoot to Edge of Bed: Patient Percentage: 10% Sit to Supine: 1: +2 Total assist Sit to Supine:   Patient Percentage: 50% Scooting to HOB: Not tested (comment) Scooting  to HOB: Patient Percentage: 0% Transfers Transfers: Sit to Stand;Stand to Sit;Stand Pivot Transfers Sit to Stand: 1: +2 Total assist;With upper extremity assist;From bed Sit to Stand: Patient Percentage: 70% Stand to Sit: 1: +2 Total assist;Without upper extremity assist;To chair/3-in-1 Stand to Sit: Patient Percentage: 70% Stand Pivot Transfers: 1: +2 Total assist Stand Pivot Transfers: Patient Percentage: 70% Lateral/Scoot Transfers: Not tested (comment) Lateral Transfers: Patient Percentage: 50% Ambulation/Gait Ambulation/Gait Assistance: 1: +2 Total assist Ambulation/Gait: Patient Percentage: 70% Ambulation Distance (Feet): 3 Feet Assistive device: Rolling walker Ambulation/Gait Assistance Details: Pt had a lot of difficulty following commands with stepping with RW.  Pt scissoring while trying to step around.   Gait Pattern: Step-to pattern;Decreased stride length;Ataxic;Scissoring;Wide base of support Stairs: No Wheelchair Mobility Wheelchair Mobility: No  ADL: ADL Eating/Feeding: NPO Where Assessed - Eating/Feeding: Edge of bed Grooming: Moderate assistance Where Assessed - Grooming: Unsupported sitting Upper Body Bathing: Maximal assistance Where Assessed - Upper Body Bathing: Supported sitting Lower Body Bathing: +1 Total assistance Where Assessed - Lower Body Bathing: Supported sit to stand Upper Body Dressing: +1 Total assistance Where Assessed - Upper Body Dressing: Unsupported sitting Lower Body Dressing: +1 Total assistance Where Assessed - Lower Body Dressing: Supported sit to stand Toilet Transfer: +2 Total assistance Toilet Transfer Method: Stand pivot Toilet Transfer Equipment:  (Bed to recliner on her left) Transfers/Ambulation Related to ADLs: total A +2 (pt=60%) sit>stand; (30%) stand >sit ADL Comments: Worked on trying to assess vison more while she was seated EOB.  Pt would track to the right and the close her eyes; could not get her to track to the left.  Pt reports that she feels dizzy (PT thinks she has some vestibular issues)  Cognition: Cognition Overall Cognitive Status: Impaired/Different from baseline Arousal/Alertness: Awake/alert Orientation Level: Oriented to person;Oriented to place;Oriented to situation;Disoriented to time Attention: Sustained Sustained Attention: Impaired Sustained Attention Impairment: Verbal basic;Functional basic Awareness: Impaired Awareness Impairment: Intellectual impairment Problem Solving: Impaired Problem Solving Impairment: Verbal basic;Functional basic Cognition Arousal/Alertness: Awake/alert Behavior During Therapy: WFL for tasks assessed/performed Overall Cognitive Status: Impaired/Different from baseline Area of Impairment: Attention;Following commands;Safety/judgement;Problem solving Orientation Level: Place;Time;Situation Current Attention Level: Sustained Memory: Decreased recall of precautions;Decreased short-term memory Following Commands: Follows one step commands inconsistently;Follows one step commands with increased time Safety/Judgement: Decreased awareness of safety;Decreased awareness of deficits Awareness: Intellectual Problem Solving: Slow processing;Decreased initiation;Difficulty sequencing;Requires verbal cues;Requires tactile cues General Comments: Pt continues to have slow processing of commands.   Difficult to assess due to: Level of arousal  Physical Exam: Blood pressure 139/86, pulse 76, temperature 98.5 F (36.9 C), temperature source Oral, resp. rate 20, height 5' 6" (1.676 m), weight 109.3 kg (240 lb 15.4 oz), last menstrual period 11/24/2011, SpO2 95.00%. Constitutional: She is oriented to person, place, and time. She appears well-developed.  Eyes: EOM are normal.  Neck: Normal range of motion. Neck supple. No thyromegaly present.  Cardiovascular: Normal rate and regular rhythm.  Pulmonary/Chest: Effort normal and breath sounds normal. No respiratory distress.   Abdominal: Soft. Bowel sounds are normal. She exhibits no distension.  Neurological: She is alert and oriented to person, place, and time.  Follows simple commands. Improved processing. Oriented to place, name. Reviewed basic biographical information. Left inattention and HH is persistent but inattention is better. Left UE grossly 4-/5. LLE is 4+ to 4-/5.  Intact touch to pain and general touch. Left central 7 is mild now. Had some difficulties when multiple   commands were presented and would lose focus and attention.   Skin: Drain site clean and dry  Psychiatric: She has a normal mood and affect. Her behavior is normal   Results for orders placed during the hospital encounter of 07/06/13 (from the past 48 hour(s))  BASIC METABOLIC PANEL     Status: Abnormal   Collection Time    07/20/13  5:15 AM      Result Value Range   Sodium 138  135 - 145 mEq/L   Potassium 3.3 (*) 3.5 - 5.1 mEq/L   Chloride 98  96 - 112 mEq/L   CO2 31  19 - 32 mEq/L   Glucose, Bld 85  70 - 99 mg/dL   BUN 12  6 - 23 mg/dL   Creatinine, Ser 0.97  0.50 - 1.10 mg/dL   Calcium 9.1  8.4 - 10.5 mg/dL   GFR calc non Af Amer 65 (*) >90 mL/min   GFR calc Af Amer 75 (*) >90 mL/min   Comment: (NOTE)     The eGFR has been calculated using the CKD EPI equation.     This calculation has not been validated in all clinical situations.     eGFR's persistently <90 mL/min signify possible Chronic Kidney     Disease.  PHOSPHORUS     Status: None   Collection Time    07/20/13  5:15 AM      Result Value Range   Phosphorus 3.5  2.3 - 4.6 mg/dL  MAGNESIUM     Status: None   Collection Time    07/20/13  5:15 AM      Result Value Range   Magnesium 2.0  1.5 - 2.5 mg/dL  CBC     Status: Abnormal   Collection Time    07/20/13  5:15 AM      Result Value Range   WBC 6.1  4.0 - 10.5 K/uL   RBC 3.89  3.87 - 5.11 MIL/uL   Hemoglobin 11.1 (*) 12.0 - 15.0 g/dL   HCT 33.7 (*) 36.0 - 46.0 %   MCV 86.6  78.0 - 100.0 fL   MCH 28.5  26.0 -  34.0 pg   MCHC 32.9  30.0 - 36.0 g/dL   RDW 16.1 (*) 11.5 - 15.5 %   Platelets 185  150 - 400 K/uL  BASIC METABOLIC PANEL     Status: Abnormal   Collection Time    07/21/13  7:07 AM      Result Value Range   Sodium 139  135 - 145 mEq/L   Potassium 3.0 (*) 3.5 - 5.1 mEq/L   Chloride 100  96 - 112 mEq/L   CO2 30  19 - 32 mEq/L   Glucose, Bld 82  70 - 99 mg/dL   BUN 10  6 - 23 mg/dL   Creatinine, Ser 0.94  0.50 - 1.10 mg/dL   Calcium 9.1  8.4 - 10.5 mg/dL   GFR calc non Af Amer 68 (*) >90 mL/min   GFR calc Af Amer 78 (*) >90 mL/min   Comment: (NOTE)     The eGFR has been calculated using the CKD EPI equation.     This calculation has not been validated in all clinical situations.     eGFR's persistently <90 mL/min signify possible Chronic Kidney     Disease.  MAGNESIUM     Status: None   Collection Time    07/21/13  7:07 AM      Result Value   Range   Magnesium 1.9  1.5 - 2.5 mg/dL   No results found.  Post Admission Physician Evaluation: 1. Functional deficits secondary  to right temporal-parietal ICH. 2. Patient is admitted to receive collaborative, interdisciplinary care between the physiatrist, rehab nursing staff, and therapy team. 3. Patient's level of medical complexity and substantial therapy needs in context of that medical necessity cannot be provided at a lesser intensity of care such as a SNF. 4. Patient has experienced substantial functional loss from his/her baseline which was documented above under the "Functional History" and "Functional Status" headings.  Judging by the patient's diagnosis, physical exam, and functional history, the patient has potential for functional progress which will result in measurable gains while on inpatient rehab.  These gains will be of substantial and practical use upon discharge  in facilitating mobility and self-care at the household level. 5. Physiatrist will provide 24 hour management of medical needs as well as oversight of the therapy  plan/treatment and provide guidance as appropriate regarding the interaction of the two. 6. 24 hour rehab nursing will assist with bladder management, bowel management, safety, skin/wound care, disease management, medication administration, pain management and patient education  and help integrate therapy concepts, techniques,education, etc. 7. PT will assess and treat for/with: Lower extremity strength, range of motion, stamina, balance, functional mobility, safety, adaptive techniques and equipment, NMR, visual perceptual rx.   Goals are: supervision. 8. OT will assess and treat for/with: ADL's, functional mobility, safety, upper extremity strength, adaptive techniques and equipment, NMR, cognitive perceptual and visual perceptual rx.   Goals are: supervision to minimal assist. 9. SLP will assess and treat for/with: cognition, communication.  Goals are: supervision. 10. Case Management and Social Worker will assess and treat for psychological issues and discharge planning. 11. Team conference will be held weekly to assess progress toward goals and to determine barriers to discharge. 12. Patient will receive at least 3 hours of therapy per day at least 5 days per week. 13. ELOS: 2 weeks       14. Prognosis:  excellent   Medical Problem List and Plan: 1. Right temporoparietal ICH. Status post burr hole ventriculostomy 2. DVT Prophylaxis/Anticoagulation: Subcutaneous Lovenox initiated 07/14/2013. Monitor platelet counts any signs of bleeding. Check vascular study 3. Pain Management: Tylenol as needed 4. Neuropsych: This patient is capable of making decisions on her own behalf. 5. Seizure prophylaxis. Depakote ER 1000 mg daily. Monitor for a seizure activity 6. Hypertension. Hydrochlorothiazide 12.5 mg daily, lisinopril 20 mg daily. Monitor with increased mobility 7. History of medical noncompliance. Counseling    Prophet Renwick T. Grier Czerwinski, MD, FAAPMR [facility]  07/21/2013 

## 2013-07-22 ENCOUNTER — Inpatient Hospital Stay (HOSPITAL_COMMUNITY): Payer: Self-pay

## 2013-07-22 ENCOUNTER — Inpatient Hospital Stay (HOSPITAL_COMMUNITY): Payer: Self-pay | Admitting: Occupational Therapy

## 2013-07-22 DIAGNOSIS — I6389 Other cerebral infarction: Secondary | ICD-10-CM

## 2013-07-22 DIAGNOSIS — M7989 Other specified soft tissue disorders: Secondary | ICD-10-CM

## 2013-07-22 LAB — COMPREHENSIVE METABOLIC PANEL
ALT: 6 U/L (ref 0–35)
Albumin: 2.8 g/dL — ABNORMAL LOW (ref 3.5–5.2)
BUN: 8 mg/dL (ref 6–23)
CO2: 28 mEq/L (ref 19–32)
Creatinine, Ser: 1.02 mg/dL (ref 0.50–1.10)
Sodium: 138 mEq/L (ref 135–145)
Total Protein: 6.5 g/dL (ref 6.0–8.3)

## 2013-07-22 LAB — CBC WITH DIFFERENTIAL/PLATELET
Basophils Relative: 1 % (ref 0–1)
Eosinophils Absolute: 0.2 10*3/uL (ref 0.0–0.7)
Eosinophils Relative: 3 % (ref 0–5)
HCT: 33.2 % — ABNORMAL LOW (ref 36.0–46.0)
Hemoglobin: 11 g/dL — ABNORMAL LOW (ref 12.0–15.0)
Lymphocytes Relative: 30 % (ref 12–46)
Lymphs Abs: 1.8 10*3/uL (ref 0.7–4.0)
MCH: 28.6 pg (ref 26.0–34.0)
MCHC: 33.1 g/dL (ref 30.0–36.0)
Monocytes Absolute: 1 10*3/uL (ref 0.1–1.0)
Monocytes Relative: 17 % — ABNORMAL HIGH (ref 3–12)

## 2013-07-22 NOTE — Evaluation (Signed)
Speech Language Pathology Assessment and Plan  Patient Details  Name: Alison Ward MRN: [id number] Date of Birth: [birth year]  SLP Diagnosis: Cognitive Impairments  Rehab Potential: Excellent ELOS: 2 weeks   Today's Date: 07/22/2013 Time: 1300-1400 Time Calculation (min): 60 min  Problem List:  Patient Active Problem List   Diagnosis Date Noted  . 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 ICH (intracerebral hemorrhage) 07/21/2013  . Hypokalemia 07/21/2013  . Intracerebral hemorrhage 07/06/2013  . HYPERTENSION 02/24/2009   Past Medical History:  Past Medical History  Diagnosis Date  . Hypertension    Past Surgical History:  Past Surgical History  Procedure Laterality Date  . Ventriculostomy Left 07/07/2013    Procedure: VENTRICULOSTOMY;  Surgeon: Cristi Loron, MD;  Location: [facility];  Service: Neurosurgery;  Laterality: Left;  Left Ventriculostomy and Removal of Right Vetriculostomy    Assessment / Plan / Recommendation Clinical Impression  Pt is a 52 y.o. right-handed [place] American female with history of hypertension and poor medical compliance. Admitted 07/06/2013 with headache as well as lethargy. Blood pressure 202/98 on admission through the emergency room. 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 Underwent placement of right frontal ventriculostomy via burr hole 07/06/2013 per Dr. Lovell Sheehan. Carotid Dopplers with no ICA stenosis. Echocardiogram with ejection fraction of 70% and grade 1 diastolic dysfunction. Patient did not receive TPA secondary to ICH. Placed on Cardene drip for blood pressure control. Followup neurosurgery with findings of occluded ventriculostomy vessel underwent placement of left ventriculostomy and removal of right ventriculostomy 07/07/2013. On 07/18/2013  ventriculostomy was clamped and removed without difficulty. Latest followup cranial CT scan with no new hemorrhage. Maintained on depakote for seizure prophylaxis. Subcutaneous Lovenox was initiated 07/14/2013 for DVT prophylaxis. Patient is presently maintained on a regular diet. Physical and occupational therapy evaluations completed with recommendations for physical medicine rehabilitation consult to consider inpatient rehabilitation services. Patient was felt to be a good candidate for inpatient rehabilitation services was admitted for comprehensive rehabilitation program 07/21/13. SLP swallowing and cognitive-linguistic evaluations completed. Pt appears to be tolerating regular textures and thin liquids with efficient mastication and no overt s/s of aspiration. Speech is 100% clear at the conversational level. No further f/u warranted at this time for dysphagia or speech. Pt does present with cognitive deficits in the areas of alternating attention, emergent awareness, left inattention, and basic problem solving. Pt would benefit from skilled SLP services in these areas to increase functional independence prior to discharge.    SLP Assessment  Patient will need skilled Speech Lanaguage Pathology Services during CIR admission    Recommendations  Diet Recommendations: Regular;Thin liquid Liquid Administration via: Cup;Straw;No straw Medication Administration: Whole meds with liquid Supervision: Patient able to self feed;Intermittent supervision to cue for compensatory strategies Compensations: Slow rate;Small sips/bites Postural Changes and/or Swallow Maneuvers: Seated upright 90 degrees Oral Care Recommendations: Oral care BID Patient destination: Home Follow up Recommendations: Outpatient SLP;24 hour supervision/assistance Equipment Recommended: None recommended by SLP    SLP Frequency 5 out of 7 days   SLP Treatment/Interventions Cognitive remediation/compensation;Cueing  hierarchy;Environmental controls;Functional tasks;Internal/external aids;Medication managment;Patient/family education    Pain Pain Assessment Pain Assessment: No/denies pain Prior Functioning Cognitive/Linguistic Baseline: Within functional limits Type of Home: Apartment  Lives With: Alone Available Help at Discharge: Family;Available 24 hours/day Vocation: Part time employment Electronics engineer, 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 SLP Short Term Goal 2 (Week 1): Pt will demonstrate emergent awareness of impairments with Min cues. SLP Short Term Goal 3 (Week 1): Pt will demonstrate basic problem solving during functional task with Min cues. SLP Short Term Goal 4 (Week 1): Pt will attend to her left visual field with Min cues.  See FIM for current functional status Refer to Care Plan for Long Term Goals  Recommendations for other services: None  Discharge Criteria: Patient will be discharged from SLP if patient refuses treatment 3 consecutive times without medical reason, if treatment goals not met, if there is a change in medical status, if patient makes no progress towards goals or if patient is discharged from hospital.  The above assessment, treatment plan, treatment alternatives and goals were discussed and mutually agreed upon: by patient and by family  Maxcine Ham 07/22/2013, 2:23 PM  Maxcine Ham, M.A. CCC-SLP [phone]

## 2013-07-22 NOTE — Evaluation (Signed)
Occupational Therapy Assessment and Plan  Patient Details  Name: Alison Ward MRN: [id number] Date of Birth: 09-25-1961  OT Diagnosis: cognitive deficits, muscle weakness (generalized) and visual deficits, visual-perceptual deficits Rehab Potential: Rehab Potential: Excellent ELOS: 10 days   Today's Date: 07/22/2013 Time: 8295-6213 Time Calculation: 60 minutes.  Problem List:  Patient Active Problem List   Diagnosis Date Noted  . 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 ICH (intracerebral hemorrhage) 07/21/2013  . Hypokalemia 07/21/2013  . Intracerebral hemorrhage 07/06/2013  . HYPERTENSION 02/24/2009    Past Medical History:  Past Medical History  Diagnosis Date  . Hypertension    Past Surgical History:  Past Surgical History  Procedure Laterality Date  . Ventriculostomy Left 07/07/2013    Procedure: VENTRICULOSTOMY;  Surgeon: Cristi Loron, MD;  Location: [facility];  Service: Neurosurgery;  Laterality: Left;  Left Ventriculostomy and Removal of Right Vetriculostomy    Assessment & Plan  Clinical Impression: .  DOTTI Ward is a 52 y.o. right-handed African American female with history of hypertension and poor medical compliance. Admitted 07/06/2013 with headache as well as lethargy. Blood pressure 202/98 on admission through the emergency room. 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 Underwent placement of right frontal ventriculostomy via burr hole 07/06/2013 per Dr. Lovell Sheehan. Carotid Dopplers with no ICA stenosis. Echocardiogram with ejection fraction of 70% and grade 1 diastolic dysfunction. Patient did not receive TPA secondary to ICH. Placed on Cardene drip for blood pressure control. Followup neurosurgery with findings of occluded ventriculostomy vessel underwent placement of left  ventriculostomy and removal of right ventriculostomy 07/07/2013. On 07/18/2013 ventriculostomy was clamped and removed without difficulty. Latest followup cranial CT scan with no new hemorrhage. Maintained on depakote for seizure prophylaxis. Subcutaneous Lovenox was initiated 07/14/2013 for DVT prophylaxis. Patient is presently maintained on a regular diet. Physical and occupational therapy evaluations completed with recommendations for physical medicine rehabilitation consult to consider inpatient rehabilitation services. Patient was felt to be a good candidate for inpatient rehabilitation services was admitted for comprehensive rehabilitation program today. Patient transferred to CIR on 07/21/2013 .    Patient currently requires min with basic self-care skills secondary to generalized muscle weakness, decreased visual perceptual skills and hemianopsia, decreased attention to left and decreased problem solving and decreased memory.  Prior to hospitalization, patient could complete BADL's with independent .  Patient will benefit from skilled intervention to decrease level of assist with basic self-care skills and increase independence with basic self-care skills prior to discharge home with care partner.  Anticipate patient will require intermittent supervision and follow up home health.  OT Plan OT Duration/Estimated Length of Stay: 10 days     OT Assessment Rehab Potential: Excellent OT Patient demonstrates impairments in the following area(s): Balance;Cognition;Perception;Safety OT Basic ADL's Functional Problem(s): Eating;Grooming;Bathing;Dressing;Toileting OT Transfers Functional Problem(s): Toilet;Tub/Shower OT Plan OT Intensity: Minimum of 1-2 x/day, 45 to 90 minutes OT Frequency: 5 out of 7 days OT Duration/Estimated Length of Stay: 10 days OT Treatment/Interventions: Balance/vestibular training;Cognitive remediation/compensation;DME/adaptive equipment instruction;Discharge  planning;Functional mobility training;Therapeutic Activities;UE/LE Strength taining/ROM;Visual/perceptual remediation/compensation;UE/LE Coordination activities;Therapeutic Exercise;Self Care/advanced ADL retraining;Patient/family education;Disease mangement/prevention OT Self Feeding Anticipated Outcome(s): mod I OT Basic Self-Care Anticipated Outcome(s): mod I OT Toileting Anticipated Outcome(s): mod I OT Bathroom Transfers Anticipated Outcome(s): mod I OT Recommendation Patient destination: Home Follow Up Recommendations: Home health OT Equipment  Recommended: Tub/shower bench    Skilled Therapeutic Intervention  Pt participated in 1:1 evaluation/self-care retraining session.  Pt eager to get out of bed and take a shower today.  Oriented x4.  Awareness that her main deficits are with balance, memory, and with her vision.  Ambulated with RW to toilet and shower, required structure and step-by-step directions for activities.  Left hemianopsia/perceptual deficits apparent as pt frequently ran into the wall or objects on the L side when ambulating.  Pt aware of this deficit and verbalized strategies she has learned to assist with safety in ambulation, verbal cues and extra time needed to implement strategies.  Pt did not have clothing available at this time, stated she would contact her daughter today to bring some in.  Daughter will be available as a part-time caregiver upon pts possible d/c to home.  Required steadying assist for dynamic standing during bathing tasks. Verbal cues for sequencing and safety.     OT Evaluation Precautions/Restrictions    General   Vital Signs Therapy Vitals Temp: 98.2 F (36.8 C) Temp src: Oral Pulse Rate: 85 Resp: 18 BP: 111/74 mmHg Patient Position, if appropriate: Sitting Oxygen Therapy SpO2: 100 % Pain Pain Assessment Pain Assessment: No/denies pain Home Living/Prior Functioning Home Living Available Help at Discharge: Family;Available 24  hours/day Type of Home: Apartment Home Access: Stairs to enter [employer] of Steps: 3 down with right rail and then 6 with both rails Home Layout: One level Additional Comments: Pt unsure yet if she will d/c to her apartment or her mother's level entry, single level home.   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 Leisure: Hobbies-yes (Comment) Comments: travel, read, meet people    Vision/Perception  Vision - History Baseline Vision: Wears glasses all the time Patient Visual Report: Other (comment) (visual obstruction in L eye) Vision - Assessment Vision Assessment: Vision tested Visual Fields: Left homonymous hemianopsia Perception Perception: Impaired Inattention/Neglect: Does not attend to left visual field Praxis Praxis: Intact  Cognition Overall Cognitive Status: Impaired/Different from baseline Arousal/Alertness: Awake/alert Orientation Level: Oriented to person;Oriented to place;Oriented to situation;Disoriented to time Attention: Selective;Sustained;Alternating Sustained Attention: Appears intact Selective Attention: Appears intact Alternating Attention: Impaired Alternating Attention Impairment: Functional basic;Verbal basic Memory: Impaired Memory Impairment: Decreased recall of new information;Decreased short term memory Decreased Short Term Memory: Functional basic;Verbal basic Awareness: Impaired Awareness Impairment: Emergent impairment Problem Solving: Impaired Problem Solving Impairment: Verbal basic;Functional basic Executive Function: Self Monitoring Self Monitoring: Impaired Self Monitoring Impairment: Functional  basic Safety/Judgment: Appears intact Sensation Sensation Light Touch: Appears Intact Hot/Cold: Appears Intact Proprioception: Appears Intact Motor  Motor Motor: Within Functional Limits Mobility  Transfers Transfers: Sit to Stand;Stand to Sit Sit to Stand: 4: Min assist  Trunk/Postural Assessment  Cervical Assessment Cervical Assessment: Within Functional Limits Thoracic Assessment Thoracic Assessment: Within Functional Limits Lumbar Assessment Lumbar Assessment: Within Functional Limits Postural Control Postural Control: Within Functional Limits  Balance Balance Balance Assessed: Yes Dynamic Sitting Balance Dynamic Sitting - Level of Assistance: 5: Stand by assistance Static Standing Balance Static Standing - Level of Assistance: 5: Stand by assistance Dynamic Standing Balance Dynamic Standing - Balance Support: During functional activity Dynamic Standing - Level of Assistance: 4: Min assist Extremity/Trunk Assessment RUE Assessment RUE Assessment: Within Functional Limits RUE AROM (degrees) Overall AROM Right Upper  Extremity: Within functional limits for tasks performed RUE Strength RUE Overall Strength: Within Functional Limits for tasks performed RUE Tone RUE Tone: Within Functional Limits LUE Assessment LUE Assessment: Within Functional Limits LUE AROM (degrees) Overall AROM Left Upper Extremity: Within functional limits for tasks assessed LUE Strength LUE Overall Strength: Within Functional Limits for tasks assessed LUE Tone LUE Tone: Within Functional Limits    Refer to Care Plan for Long Term Goals  Recommendations for other services: None  Discharge Criteria: Patient will be discharged from OT if patient refuses treatment 3 consecutive times without medical reason, if treatment goals not met, if there is a change in medical status, if patient makes no progress towards goals or if patient is discharged from hospital.  The above assessment, treatment  plan, treatment alternatives and goals were discussed and mutually agreed upon: by patient  Kandis Ban 07/22/2013, 3:52 PM

## 2013-07-22 NOTE — Evaluation (Signed)
Physical Therapy Assessment and Plan  Patient Details  Name: Alison Ward MRN: [id number] Date of Birth: 1961/08/20  PT Diagnosis: Abnormality of gait, Cognitive deficits, Difficulty walking and Pain in head; L inattention Rehab Potential: Good ELOS: 10-14days   Today's Date: 07/22/2013 Time: 0930-1030 Time Calculation (min): 60 min  Problem List:  Patient Active Problem List   Diagnosis Date Noted  . 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 ICH (intracerebral hemorrhage) 07/21/2013  . Hypokalemia 07/21/2013  . Intracerebral hemorrhage 07/06/2013  . HYPERTENSION 02/24/2009    Past Medical History:  Past Medical History  Diagnosis Date  . Hypertension    Past Surgical History:  Past Surgical History  Procedure Laterality Date  . Ventriculostomy Left 07/07/2013    Procedure: VENTRICULOSTOMY;  Surgeon: Cristi Loron, MD;  Location: [facility];  Service: Neurosurgery;  Laterality: Left;  Left Ventriculostomy and Removal of Right Vetriculostomy    Assessment & Plan Clinical Impression: Alison Ward is a 52 y.o. right-handed African American female with history of hypertension and poor medical compliance. Admitted 07/06/2013 with headache as well as lethargy. Blood pressure 202/98 on admission through the emergency room. 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 Underwent placement of right frontal ventriculostomy via burr hole 07/06/2013 per Dr. Lovell Sheehan. Carotid Dopplers with no ICA stenosis. Echocardiogram with ejection fraction of 70% and grade 1 diastolic dysfunction. Patient did not receive TPA secondary to ICH. Placed on Cardene drip for blood pressure control. Followup neurosurgery with findings of occluded ventriculostomy vessel underwent placement of left ventriculostomy and removal of  right ventriculostomy 07/07/2013. On 07/18/2013 ventriculostomy was clamped and removed without difficulty. Latest followup cranial CT scan with no new hemorrhage. Maintained on depakote for seizure prophylaxis. Subcutaneous Lovenox was initiated 07/14/2013 for DVT prophylaxis. Patient is presently maintained on a regular diet. Physical and occupational therapy evaluations completed with recommendations for physical medicine rehabilitation consult to consider inpatient rehabilitation services. Patient was felt to be a good candidate for inpatient rehabilitation services was admitted for comprehensive rehabilitation program today. Patient transferred to CIR on 07/21/2013 .   Patient currently requires min with mobility secondary to muscle weakness, decreased visual perceptual skills and hemianopsia, decreased attention to left and decreased awareness, decreased memory and delayed processing.  Prior to hospitalization, patient was independent  with mobility and lived with   in a Apartment home.  Home access is 3 down with right rail and then 6 with both railsStairs to enter.  Patient will benefit from skilled PT intervention to maximize safe functional mobility, minimize fall risk and decrease caregiver burden for planned discharge home with intermittent assist.  Anticipate patient will benefit from follow up HH vs. OP TBD at discharge.  PT - End of Session Endurance Deficit: Yes PT Assessment Rehab Potential: Good PT Patient demonstrates impairments in the following area(s): Balance;Endurance;Perception;Safety PT Transfers Functional Problem(s): Bed Mobility;Bed to Chair;Car;Furniture;Floor PT Locomotion Functional Problem(s): Ambulation;Wheelchair Mobility;Stairs PT Plan PT Intensity: Minimum of 1-2 x/day ,45 to 90 minutes PT Frequency: 5 out of 7 days PT Duration Estimated Length of Stay: 10-14days PT Treatment/Interventions: Ambulation/gait training;Balance/vestibular training;Cognitive  remediation/compensation;Disease management/prevention;Discharge planning;Community reintegration;DME/adaptive equipment instruction;Functional mobility training;Patient/family education;Pain management;Neuromuscular re-education;Psychosocial support;Skin care/wound management;Therapeutic Exercise;Therapeutic Activities;Stair training;UE/LE Strength taining/ROM;UE/LE Coordination activities;Visual/perceptual remediation/compensation;Wheelchair propulsion/positioning PT Transfers Anticipated Outcome(s): Mod(I) PT Locomotion Anticipated Outcome(s): Mod(I) PT Recommendation Follow Up Recommendations: Home  health PT;Outpatient PT Patient destination: Home Equipment Details: DME TBD  Skilled Therapeutic Intervention 1:1. Pt sitting in w/c w/ OT in room upon entry. PT evaluation performed, see detailed objective information below. Pt A&Ox4, however, demonstrates STM impairments. Pt stating, "I was going to go to the sink to do something, but I forgot what." Therapist cued pt to brush teeth. Pt educated on observed cognitive impairments including decreased STM and L inattention. Pt demonstrates intellectual awareness of cognitive deficits, specifically L inattention during tx session, however, unaware of L inattention during functional mobility, req consistent mod v/c + t/c for correction. Due to pt consistently drifting L and running into obstacles on L side during amb and w/c mobility, pt cued to use R side of hall as guide, but unable to maintain. Pt req (S) for bed mobility and min A for stand pivot transfers, amb w/ RW, stair negotiation and w/c propulsion this session. Pt supine in bed at end of tx session w/ all needs in reach and bed alarm on.   PT Evaluation Precautions/Restrictions Precautions Precautions: Fall Precaution Comments: ventricular shunt  Restrictions Weight Bearing Restrictions: No General Chart Reviewed: Yes Family/Caregiver Present: No Vital Signs  Pain Pain Assessment Pain  Assessment: No/denies pain (Pt reports that this is he best she has felt) Home Living/Prior Functioning Home Living Available Help at Discharge: Family;Available 24 hours/day Type of Home: Apartment Home Access: Stairs to enter [employer] of Steps: 3 down with right rail and then 6 with both rails Home Layout: One level Additional Comments: Pt unsure yet if she will d/c to her apartment or her mother's level entry, single level home.  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 Leisure: Hobbies-yes (Comment) Comments: travel, read, meet people Vision/Perception  Vision - History Baseline Vision: Wears glasses all the time Patient Visual Report: Other (comment) (Pt reports visual obstruction in L eye increases w/ HA, fatigue, hunger) Vision - Assessment Vision Assessment: Vision tested Visual Fields: Left homonymous hemianopsia Perception Perception: Impaired Inattention/Neglect: Does not attend to left visual field Praxis Praxis: Intact  Cognition Overall Cognitive Status: Impaired/Different from baseline Arousal/Alertness: Awake/alert Orientation Level: Oriented X4 Attention: Selective;Sustained;Alternating Sustained Attention: Appears intact Selective Attention: Appears intact Alternating Attention: Impaired Alternating Attention Impairment: Functional basic;Verbal basic Memory: Impaired Memory Impairment: Decreased short term memory Decreased Short Term Memory: Functional basic Awareness: Impaired Awareness Impairment: Emergent impairment Problem Solving: Impaired Problem Solving Impairment: Functional basic Safety/Judgment: Appears intact Comments: Pt verbalized understanding that she needs to call for  assist when OOB, multiple times w/out cues Sensation Sensation Light Touch: Appears Intact Hot/Cold: Appears Intact Proprioception: Appears Intact Coordination Gross Motor Movements are Fluid and Coordinated: Yes Fine Motor Movements are Fluid and Coordinated: No Motor  Motor Motor: Within Functional Limits  Mobility Bed Mobility Bed Mobility: Supine to Sit;Sit to Supine Supine to Sit: 5: Supervision Sit to Supine: 5: Supervision Transfers Transfers: Yes Sit to Stand: 4: Min guard Stand to Sit: 4: Min guard Stand Pivot Transfers: 4: Min Actuary Details: Verbal cues for precautions/safety;Verbal cues for sequencing Locomotion  Ambulation Ambulation: Yes Ambulation/Gait Assistance: 4: Min assist Ambulation Distance (Feet): 150 Feet Assistive device: Rolling walker Ambulation/Gait Assistance Details: Verbal cues for precautions/safety;Verbal cues for sequencing Ambulation/Gait Assistance Details: Pt req consistent v/c to attend to L side of environment during amb, consistently drifting  to L side. Pt cued to stay close to visual targets on R side, however, unable to maintain  Gait Gait: Yes Gait Pattern: Step-through pattern;Decreased step length - left;Decreased step length - right;Lateral trunk lean to right Gait velocity: decreased Stairs / Additional Locomotion Stairs: Yes Stairs Assistance: 4: Min assist Stairs Assistance Details: Verbal cues for precautions/safety;Verbal cues for sequencing Stairs Assistance Details (indicate cue type and reason): Pt req v/c to attend to L side to notice that there was a rail to use Stair Management Technique: Two rails Number of Stairs: 4 Wheelchair Mobility Wheelchair Mobility: Yes Wheelchair Assistance: 4: Administrator, sports Details: Tactile cues for sequencing;Verbal cues for precautions/safety;Verbal cues for sequencing Wheelchair Propulsion: Both upper extremities;Both lower  extermities Wheelchair Parts Management: Needs assistance Distance: 50-75' x 5; Pt req consistent v/c to attend to L side of environment.   Trunk/Postural Assessment  Cervical Assessment Cervical Assessment: Within Functional Limits Thoracic Assessment Thoracic Assessment: Within Functional Limits Lumbar Assessment Lumbar Assessment: Within Functional Limits Postural Control Postural Control: Within Functional Limits  Balance Balance Balance Assessed: Yes Static Sitting Balance Static Sitting - Balance Support: No upper extremity supported;Feet supported Static Sitting - Level of Assistance: 5: Stand by assistance Dynamic Sitting Balance Dynamic Sitting - Balance Support: No upper extremity supported;Feet supported Dynamic Sitting - Level of Assistance: 5: Stand by assistance Dynamic Sitting - Balance Activities: Lateral lean/weight shifting;Forward lean/weight shifting;Reaching for objects;Reaching across midline Dynamic Sitting - Comments: Brushing teeth Static Standing Balance Static Standing - Balance Support: Bilateral upper extremity supported Static Standing - Level of Assistance: 5: Stand by assistance Dynamic Standing Balance Dynamic Standing - Balance Support: Bilateral upper extremity supported Dynamic Standing - Level of Assistance: 4: Min assist Dynamic Standing - Balance Activities: Lateral lean/weight shifting;Forward lean/weight shifting;Reaching for objects Extremity Assessment   RLE Assessment RLE Assessment: Exceptions to [facility] RLE Strength Right Hip Flexion: 3/5 Right Knee Flexion: 3+/5 Right Knee Extension: 3+/5 Right Ankle Dorsiflexion: 3+/5 Right Ankle Plantar Flexion: 3+/5 LLE Assessment LLE Assessment: Exceptions to [facility] LLE Strength Left Hip Flexion: 3+/5 Left Knee Flexion: 4/5 Left Knee Extension: 4/5 Left Ankle Dorsiflexion: 4/5 Left Ankle Plantar Flexion: 4/5  FIM:  FIM - Bed/Chair Transfer Bed/Chair Transfer Assistive Devices:  Therapist, occupational: 5: Supine > Sit: Supervision (verbal cues/safety issues);5: Sit > Supine: Supervision (verbal cues/safety issues);4: Bed > Chair or W/C: Min A (steadying Pt. > 75%);4: Chair or W/C > Bed: Min A (steadying Pt. > 75%) FIM - Locomotion: Wheelchair Distance: 50-75' x 5; Pt req consistent v/c to attend to L side of environment.  Locomotion: Wheelchair: 2: Travels 50 - 149 ft with minimal assistance (Pt.>75%) FIM - Locomotion: Ambulation Locomotion: Ambulation Assistive Devices: Designer, industrial/product Ambulation/Gait Assistance: 4: Min assist Locomotion: Ambulation: 4: Travels 150 ft or more with minimal assistance (Pt.>75%) FIM - Locomotion: Stairs Locomotion: Building control surveyor: Hand rail - 2 Locomotion: Stairs: 2: Up and Down 4 - 11 stairs with minimal assistance (Pt.>75%)   Refer to Care Plan for Long Term Goals  Recommendations for other services: None  Discharge Criteria: Patient will be discharged from PT if patient refuses treatment 3 consecutive times without medical reason, if treatment goals not met, if there is a change in medical status, if patient makes no progress towards goals or if patient is discharged from hospital.  The above assessment, treatment plan, treatment alternatives and goals were discussed and mutually agreed upon: by patient  Denzil Hughes 07/22/2013, 10:54 AM

## 2013-07-22 NOTE — Progress Notes (Signed)
Subjective/Complaints: Had a fairly good evening. Headache better. Able to sleep. Ready to start therapies today A 12 point review of systems has been performed and if not noted above is otherwise negative.   Objective: Vital Signs: Blood pressure 165/88, pulse 73, temperature 97.8 F (36.6 C), temperature source Oral, resp. rate 18, height 5\' 8"  (1.727 m), weight 107.502 kg (237 lb), last menstrual period 11/24/2011, SpO2 99.00%. No results found.  Recent Labs  07/20/13 0515 07/22/13 0550  WBC 6.1 6.1  HGB 11.1* 11.0*  HCT 33.7* 33.2*  PLT 185 245    Recent Labs  07/21/13 0707 07/22/13 0550  NA 139 138  K 3.0* 3.6  CL 100 99  GLUCOSE 82 84  BUN 10 8  CREATININE 0.94 1.02  CALCIUM 9.1 9.1   CBG (last 3)  No results found for this basename: GLUCAP,  in the last 72 hours  Wt Readings from Last 3 Encounters:  07/21/13 107.502 kg (237 lb)  07/21/13 109.3 kg (240 lb 15.4 oz)  07/21/13 109.3 kg (240 lb 15.4 oz)    Physical Exam:  Constitutional: She is oriented to person, place, and time. She appears well-developed. Right leg hanging off the bed Eyes: EOM are normal.  Neck: Normal range of motion. Neck supple. No thyromegaly present.  Cardiovascular: Normal rate and regular rhythm.  Pulmonary/Chest: Effort normal and breath sounds normal. No respiratory distress.  Abdominal: Soft. Bowel sounds are normal. She exhibits no distension.  Neurological: She is alert and oriented to person, place, and time.  Follows simple commands. Improved processing. Oriented to place, name. Reviewed basic biographical information. Left inattention and HH is persistent but inattention is better. Left UE grossly 4-/5. LLE is 4+ to 4-/5. Intact touch to pain and general touch. Left central 7 is minimal.  Skin: Drain site clean and dry  Psychiatric: She has a normal mood and affect. Her behavior is normal   Assessment/Plan: 1. Functional deficits secondary to right Temporo-parietal ICH  which require 3+ hours per day of interdisciplinary therapy in a comprehensive inpatient rehab setting. Physiatrist is providing close team supervision and 24 hour management of active medical problems listed below. Physiatrist and rehab team continue to assess barriers to discharge/monitor patient progress toward functional and medical goals. FIM:                   Comprehension Comprehension Mode: Auditory Comprehension: 5-Understands complex 90% of the time/Cues < 10% of the time  Expression Expression Mode: Verbal Expression: 5-Expresses complex 90% of the time/cues < 10% of the time  Social Interaction Social Interaction: 5-Interacts appropriately 90% of the time - Needs monitoring or encouragement for participation or interaction.  Problem Solving Problem Solving: 4-Solves basic 75 - 89% of the time/requires cueing 10 - 24% of the time  Memory Memory: 6-More than reasonable amt of time  Medical Problem List and Plan:  1. 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 DVT Prophylaxis/Anticoagulation: Subcutaneous Lovenox initiated 07/14/2013. Monitor platelet counts any signs of bleeding. Check vascular study  3. Pain Management: Tylenol as needed  4. Neuropsych: This patient is capable of making decisions on her own behalf.  5. Seizure prophylaxis. Depakote ER 1000 mg daily. Monitor for a seizure activity  6. Hypertension. Hydrochlorothiazide 12.5 mg daily, lisinopril 20 mg daily. Monitor with increased mobility  7. History of medical noncompliance. Counseling  LOS (Days) 1 A FACE TO FACE EVALUATION WAS PERFORMED  Ben Sanz T 07/22/2013 7:59 AM

## 2013-07-22 NOTE — Progress Notes (Signed)
Occupational Therapy Session Note  Patient Details  Name: Alison Ward MRN: [id number] Date of Birth: November 16, 1960  Today's Date: 07/22/2013 Time: 1478-2956 Time Calculation (min): 45 min  Short Term Goals: Week 1:   STG=LTG  Skilled Therapeutic Interventions/Progress Updates: Therapeutic activity with emphasis on functional mobility using RW, compensatory strategies (scanning, right to left) for visual/perceptual field deficits (left inattention), transfers (bed/toilet), and safety awareness.   Patient completed bed mobility using bed rails and requested contact with her daughter by phone to ask for clothing.  Patient was unable to dial number effectively without assistance.  Patient completed toilet transfer from RW without evidence of balance deficits but required verbal cues to locate grab bars and doorway to exit bathroom when she finished.   Patient was also escorted to ADL kitchen and challenged to locate appliances but required tactile cues to find refrigerator and verbal cues to find microwave and wall clock.  Patient verbalizes awareness of visual/perceptual deficits but does not consistent turn her head top the left when scanning.     Therapy Documentation Precautions:  Precautions Precautions: Fall Precaution Comments: ventricular shunt  Restrictions Weight Bearing Restrictions: No  Pain: Pain Assessment Pain Assessment: No/denies pain  See FIM for current functional status  Therapy/Group: Individual Therapy  Georgeanne Nim 07/22/2013, 3:45 PM

## 2013-07-22 NOTE — Evaluation (Signed)
This note has been reviewed and this clinician agrees with information provided.  

## 2013-07-23 ENCOUNTER — Encounter (HOSPITAL_COMMUNITY): Payer: Self-pay | Admitting: Occupational Therapy

## 2013-07-23 ENCOUNTER — Inpatient Hospital Stay (HOSPITAL_COMMUNITY): Payer: Self-pay

## 2013-07-23 DIAGNOSIS — I10 Essential (primary) hypertension: Secondary | ICD-10-CM

## 2013-07-23 DIAGNOSIS — I619 Nontraumatic intracerebral hemorrhage, unspecified: Secondary | ICD-10-CM

## 2013-07-23 NOTE — Progress Notes (Addendum)
Physical Therapy Session Note  Patient Details  Name: Alison Ward MRN: [id number] Date of Birth: 1961/09/02  Today's Date: 07/23/2013 Time: 1000-1100 Time Calculation (min): 60 min  Short Term Goals: Week 1:  PT Short Term Goal 1 (Week 1): Pt to demonstrate emergent awarness of L inattention during functional mobility w/ min v/c 50% of time PT Short Term Goal 2 (Week 1): Pt to perform (I) bed mobility in a standard bed PT Short Term Goal 3 (Week 1): Pt to amb 150' w/ LRAD and supervision 75% of time in a busy environment PT Short Term Goal 4 (Week 1): Pt to propel w/c x150' w/ supervision 50% of time in a busy environment PT Short Term Goal 4 - Progress (Week 1): Discontinued (comment) (Ambulation is pt's primary means of mobility)  Skilled Therapeutic Interventions/Progress Updates:  1:1. Pt received supine in bed, awake. Pt able to t/f sup>sit EOB w/ HOB flat and supervision. Pt requesting to use bathroom, req min A for transfer to toilet and steadying assist for clothing management/pericare w/ use of grab bar. Focus majority of tx session on amb w/out use of RW for balance and attending to environment on L side. Pt initiating compensatory cues for L inattention taught yesterday w/out prompting from therapist, including scanning and use of R side of hall as guide to prevent drifting L and from bumping into objects on L side. Cognitive challenges added during amb included: searching for room numbers on both L and R sides of hall, negotiation in kitchen to set table as well as tossing ball to self. Pt bumped into obstacles on L side 4x during tx session. Pt able to amb >500' throughout tx session w/ close (S) to occasional min A. Pt w/ good tolerance to tx session, intermittent min-mod v/c for problem solving and attending to L side of environment. Pt sitting in recliner at end of tx session w/ family in room.   Therapy Documentation Precautions:  Precautions Precautions: Fall Precaution  Comments: ventricular shunt  Restrictions Weight Bearing Restrictions: No   Pain: Pain Assessment Pain Assessment: No/denies pain  See FIM for current functional status  Therapy/Group: Individual Therapy  Denzil Hughes 07/23/2013, 12:03 PM

## 2013-07-23 NOTE — Progress Notes (Signed)
Note reviewed and accurately reflects treatment session.   

## 2013-07-23 NOTE — Progress Notes (Signed)
Occupational Therapy Session Note  Patient Details  Name: Alison Ward MRN: [id number] Date of Birth: [birth year]  Today's Date: 07/23/2013 Time: 0800-0900 Time Calculation: 60 minutes  Short Term Goals: Week 1:  OT Short Term Goal 1 (Week 1): Pt will require supervision for dynamic standing during ADL tasks. OT Short Term Goal 2 (Week 1): Pt will implement visual scanning strategies to the L during functional ambulation with no more than min cues. OT Short Term Goal 3 (Week 1): Pt will complete shower transfer with supervision.  Skilled Therapeutic Interventions/Progress Updates:    Pt engaged in 1:1 self-care session focused on sequencing, L sided attention during ambulation, day-to-day recall.  Pt wanted to use the phone to request her family bring more clothing.  Pt initially unable to complete call correctly x2, able to self-correct and recall instructions OT had given yesterday to make phone call.  Ambulated to shower with mod cues for L sided attention/scanning environment. Pt hit multiple objects with shoulder and rolling walker, on both L side and then on R side when attempting to compensate for her visual impairment.  Bathed in disorganized manner, frequently stood and turned around and required mod verbal cues to make decisions for sequencing and decision-making.  Limited clothing available for dressing. Groomed standing at sink with supervision, no LOB.    Therapy Documentation Precautions:  Precautions Precautions: Fall Precaution Comments: ventricular shunt  Restrictions Weight Bearing Restrictions: No    Pain: Pain Assessment Pain Assessment: No/denies pain  See FIM for current functional status  Therapy/Group: Individual Therapy  Kandis Ban 07/23/2013, 12:29 PM

## 2013-07-23 NOTE — Progress Notes (Deleted)
Occupational Therapy Note  Patient Details  Name: Alison Ward MRN: [id number] Date of Birth: 07-20-1961 Today's Date: 07/23/2013    Time: 1100-1145 Pt denies pain Individual Therapy  Pt resting in recliner upon arrival.  Dtr present for part of session. Pt stated she needed to put up her clothing that her daughter had just brought her.  Pt amb in room without AD and steady A.  Pt required min verbal cues to locate items/objects in left visual field.  Pt would pass by objects on left that she was looking for.  Pt required max verbal cues to scan to left to locate items.  Pt amb without AD in hallway with steady A to locate patient laundry and return back to room.  Pt passed by the rooms she was looking for on left and would turn around after she realized she had missed them.  Educated patient and daughter on importance of scanning to left to locate items as well as safety implications. Lavone Neri [facility] 07/23/2013, 2:49 PM

## 2013-07-23 NOTE — Progress Notes (Signed)
This note has been reviewed and this clinician agrees with information provided.  

## 2013-07-23 NOTE — Progress Notes (Signed)
Speech Language Pathology Daily Session Note  Patient Details  Name: Alison Ward MRN: [id number] Date of Birth: October 03, 1961  Today's Date: 07/23/2013 Time: 1030-1100 Time Calculation (min): 30 min  Short Term Goals: Week 1: SLP Short Term Goal 1 (Week 1): Pt will alternate attention between functional tasks for 30 min with Min cues. SLP Short Term Goal 2 (Week 1): Pt will demonstrate emergent awareness of impairments with Min cues. SLP Short Term Goal 3 (Week 1): Pt will demonstrate basic problem solving during functional task with Min cues. SLP Short Term Goal 4 (Week 1): Pt will attend to her left visual field with Min cues.  Skilled Therapeutic Interventions: Treatment focus on cognitive goals. SLP facilitated session by providing extra time for basic money management tasks of sorting, counting and making appropriate change and pt was overall Mod I for check writing task. Pt also participated in visual-perceptual drawing task and required Mod visual and verbal cues to self-monitor and correct errors.    FIM:  Comprehension Comprehension Mode: Auditory Comprehension: 5-Understands basic 90% of the time/requires cueing < 10% of the time Expression Expression Mode: Verbal Expression: 5-Expresses basic needs/ideas: With no assist Social Interaction Social Interaction: 5-Interacts appropriately 90% of the time - Needs monitoring or encouragement for participation or interaction. Problem Solving Problem Solving: 3-Solves basic 50 - 74% of the time/requires cueing 25 - 49% of the time Memory Memory: 5-Recognizes or recalls 90% of the time/requires cueing < 10% of the time  Pain Pain Assessment Pain Assessment: No/denies pain  Therapy/Group: Individual Therapy  Dejean Tribby 07/23/2013, 12:26 PM

## 2013-07-23 NOTE — Progress Notes (Signed)
Occupational Therapy Session Note  Patient Details  Name: Alison Ward MRN: [id number] Date of Birth: January 07, 1961  Today's Date: 07/23/2013 Time: 1100-1145 Time Calculation (min): 45 min  Short Term Goals: Week 1:  OT Short Term Goal 1 (Week 1): Pt will require supervision for dynamic standing during ADL tasks. OT Short Term Goal 2 (Week 1): Pt will implement visual scanning strategies to the L during functional ambulation with no more than min cues. OT Short Term Goal 3 (Week 1): Pt will complete shower transfer with supervision.  Skilled Therapeutic Interventions/Progress Updates:    pt sitting in chair upon entering room. Pt stated daughter just brought bag of clothes, and wanted to put them away. Pt stood from chair and attempted to find closet to place clothes. Pt opened bathroom door, needed cues to continue walking to find closet. Pt stood next to bed and sorted clothes with close supervision from therapist. Pt put away some clothes in closet and others in dresser drawers next to bed. Pt able to place clothes in correct drawers with underwear/gown on top drawer, shirts in middle drawer, and pants in last drawer. Pt required cueing to close last drawer. Pt ambulated from room to rehab apartment, for rest break, then from rehab apartment to laundry room. Pt instructed to point out all the bullentin boards on the left side and read something on them. Pt passed first bullentin board, and when asked to read large heading across board pt only read word closest to right side. Therapist instructed pt to turn head all the way to the left,then pt was able to read the heading completely. Pt was able to identify next bullentin board and read the entire heading. Pt took rest break by elevators then ambulated to laundry room. Therapist asked pt to identify laundry room which was on left, pt passed entrance, but found location once presented on right side. Pt ambulated from laundry room to room, with on  rest break.  Pt able to toss and catch pall while walking with another therapist in front. Pt asked to identify room which was on left side, pt  passed entrance and was able to locate in once turned around and room was on right side. Focus of session dyanamic standing balance, and visual scanning to the left during functional ambulation.   Therapy Documentation Precautions:  Precautions Precautions: Fall Precaution Comments: ventricular shunt  Restrictions Weight Bearing Restrictions: No    Pain: Pain Assessment Pain Assessment: No/denies pain  See FIM for current functional status  Therapy/Group: Individual Therapy  Olivia Mackie 07/23/2013, 12:08 PM

## 2013-07-23 NOTE — Progress Notes (Signed)
Subjective/Complaints: Had a good night. Mild headache.  A 12 point review of systems has been performed and if not noted above is otherwise negative.   Objective: Vital Signs: Blood pressure 107/56, pulse 81, temperature 98.7 F (37.1 C), temperature source Oral, resp. rate 17, height 5\' 8"  (1.727 m), weight 107.502 kg (237 lb), last menstrual period 11/24/2011, SpO2 98.00%. No results found.  Recent Labs  07/22/13 0550  WBC 6.1  HGB 11.0*  HCT 33.2*  PLT 245    Recent Labs  07/21/13 0707 07/22/13 0550  NA 139 138  K 3.0* 3.6  CL 100 99  GLUCOSE 82 84  BUN 10 8  CREATININE 0.94 1.02  CALCIUM 9.1 9.1   CBG (last 3)  No results found for this basename: GLUCAP,  in the last 72 hours  Wt Readings from Last 3 Encounters:  07/21/13 107.502 kg (237 lb)  07/21/13 109.3 kg (240 lb 15.4 oz)  07/21/13 109.3 kg (240 lb 15.4 oz)    Physical Exam:  Constitutional: She is oriented to person, place, and time. She appears well-developed. Right leg hanging off the bed Eyes: EOM are normal.  Neck: Normal range of motion. Neck supple. No thyromegaly present.  Cardiovascular: Normal rate and regular rhythm.  Pulmonary/Chest: Effort normal and breath sounds normal. No respiratory distress.  Abdominal: Soft. Bowel sounds are normal. She exhibits no distension.  Neurological: She is alert and oriented to person, place, and time.  Intact insight and awareness. Memory intact.  Left inattention and HH is persistent but inattention is better. Left UE grossly 4-/5. LLE is 4+ to 4-/5. Intact touch to pain and general touch. Left central 7 is minimal.  Skin: Drain site clean and dry  Psychiatric: She has a normal mood and affect. Her behavior is normal   Assessment/Plan: 1. Functional deficits secondary to right Temporo-parietal ICH which require 3+ hours per day of interdisciplinary therapy in a comprehensive inpatient rehab setting. Physiatrist is providing close team supervision and 24  hour management of active medical problems listed below. Physiatrist and rehab team continue to assess barriers to discharge/monitor patient progress toward functional and medical goals.  Making outstanding progress  FIM:          FIM - Diplomatic Services operational officer Devices: Grab bars Toilet Transfers: 4-To toilet/BSC: Min A (steadying Pt. > 75%);4-From toilet/BSC: Min A (steadying Pt. > 75%)  FIM - Bed/Chair Transfer Bed/Chair Transfer Assistive Devices: Therapist, occupational: 4: Bed > Chair or W/C: Min A (steadying Pt. > 75%);4: Chair or W/C > Bed: Min A (steadying Pt. > 75%)  FIM - Locomotion: Wheelchair Distance: 50-75' x 5; Pt req consistent v/c to attend to L side of environment.  Locomotion: Wheelchair: 2: Travels 50 - 149 ft with minimal assistance (Pt.>75%) FIM - Locomotion: Ambulation Locomotion: Ambulation Assistive Devices: Designer, industrial/product Ambulation/Gait Assistance: 4: Min assist Locomotion: Ambulation: 4: Travels 150 ft or more with minimal assistance (Pt.>75%)  Comprehension Comprehension Mode: Auditory Comprehension: 5-Understands basic 90% of the time/requires cueing < 10% of the time  Expression Expression Mode: Verbal Expression: 5-Expresses basic needs/ideas: With no assist  Social Interaction Social Interaction: 5-Interacts appropriately 90% of the time - Needs monitoring or encouragement for participation or interaction.  Problem Solving Problem Solving: 3-Solves basic 50 - 74% of the time/requires cueing 25 - 49% of the time  Memory Memory: 5-Recognizes or recalls 90% of the time/requires cueing < 10% of the time  Medical Problem List and Plan:  1.  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 DVT Prophylaxis/Anticoagulation: Subcutaneous Lovenox initiated 07/14/2013. Monitor platelet counts any signs of bleeding. Dopplers negative 3. Pain Management: Tylenol as needed  4. Neuropsych: This patient is  capable of making decisions on her own behalf.  5. Seizure prophylaxis. Depakote ER 1000 mg daily. Continue for now  6. Hypertension. Hydrochlorothiazide 12.5 mg daily, lisinopril 20 mg daily. Monitor with increased mobility  7. History of medical noncompliance. Counseling  LOS (Days) 2 A FACE TO FACE EVALUATION WAS PERFORMED  SWARTZ,ZACHARY T 07/23/2013 7:48 AM

## 2013-07-24 ENCOUNTER — Inpatient Hospital Stay (HOSPITAL_COMMUNITY): Payer: Self-pay

## 2013-07-24 ENCOUNTER — Inpatient Hospital Stay (HOSPITAL_COMMUNITY): Payer: Self-pay | Admitting: Speech Pathology

## 2013-07-24 ENCOUNTER — Encounter (HOSPITAL_COMMUNITY): Payer: Self-pay | Admitting: Occupational Therapy

## 2013-07-24 NOTE — Patient Care Conference (Signed)
Inpatient RehabilitationTeam Conference and Plan of Care Update Date: 07/22/2013   Time: 2:55 PM    Patient Name: Alison Ward      Medical Record Number: [id number]  Date of Birth: 03-14-1961 Sex: Female         Room/Bed: 4M02C/4M02C-01 Payor Info: Payor: MEDICAID POTENTIAL / Plan: MEDICAID POTENTIAL / Product Type: *No Product type* /    Admitting Diagnosis: R ICH  Admit Date/Time:  07/21/2013  6:05 PM Admission Comments: No comment available   Primary Diagnosis:  Acute hemorrhagic infarction of brain Principal Problem: Acute hemorrhagic infarction of brain  Patient Active Problem List   Diagnosis Date Noted  . 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 ICH (intracerebral hemorrhage) 07/21/2013  . Hypokalemia 07/21/2013  . Intracerebral hemorrhage 07/06/2013  . HYPERTENSION 02/24/2009    Expected Discharge Date: Expected Discharge Date: 07/30/13  Team Members Present: Physician leading conference: Dr. Faith Rogue Social Worker Present: Amada Jupiter, LCSW Nurse Present: Kennon Portela, RN PT Present: Zerita Boers, PT OT Present: Roney Mans, OT;Frank Barthold, Loistine Chance, OT SLP Present: Feliberto Gottron, SLP PPS Coordinator present : Tora Duck, RN, CRRN     Current Status/Progress Goal Weekly Team Focus  Medical   RIGHT T-P HEMORRHAGE. IMPROVING NICELY. STILL WITH VISUAL-SPATIAL DEFICITS  PAIN CONTROL, SAFETY  MAXIMIZE MEDICALLY FOR DC   Bowel/Bladder   pt contient of bowel and bladder         Swallow/Nutrition/ Hydration             ADL's   overall min A  mod I  L attention, visual/perceptual, dynamic standing balance   Mobility   Min A overall  Mod(I) for bed mobility, standing balance, transfers, amb w/ LRAD; (S) for car t/f and stair negotiation  Safety, awareness of L inattention w/ compensatory strategies/cues, quality of funcitonal mobility, functional endurance   Communication             Safety/Cognition/ Behavioral Observations          Pain   complains of headache at times; prn tylenol   3 or less  monitor pain and medicate as needed   Skin   suture inplace to left scalp; incision healing nicely  remain free of skin breakdown and infection  assess skin q shift    Rehab Goals Patient on target to meet rehab goals: Yes *See Care Plan and progress notes for long and short-term goals.  Barriers to Discharge: VISUAL,SPATIAL DEFICITS    Possible Resolutions to Barriers:  SUPERVISION, VISUAL-PERCEPTUAL THERAPY, ADAPTIVE EQUIPMENT    Discharge Planning/Teaching Needs:  home with family able to provide 24/7 assistance      Team Discussion:  New eval - making good gains.  Currently overall minimal assist with mod i goals.  Visual-spatial deficits.  Higher level cognitive deficits.  Revisions to Treatment Plan:  None   Continued Need for Acute Rehabilitation Level of Care: The patient requires daily medical management by a physician with specialized training in physical medicine and rehabilitation for the following conditions: Daily direction of a multidisciplinary physical rehabilitation program to ensure safe treatment while eliciting the highest outcome that is of practical value to the patient.: Yes Daily medical management of patient stability for increased activity during participation in an intensive rehabilitation regime.: Yes Daily analysis of laboratory values and/or radiology reports with any subsequent need for medication adjustment of medical intervention for : Post surgical problems;Neurological problems  Talina Pleitez 07/24/2013, 9:59 AM

## 2013-07-24 NOTE — Progress Notes (Signed)
Subjective/Complaints: Pleased with progress no new complaints. Occasional buttock pain.  A 12 point review of systems has been performed and if not noted above is otherwise negative.   Objective: Vital Signs: Blood pressure 132/69, pulse 70, temperature 98.4 F (36.9 C), temperature source Oral, resp. rate 18, height 5\' 8"  (1.727 m), weight 110.1 kg (242 lb 11.6 oz), last menstrual period 11/24/2011, SpO2 99.00%. No results found.  Recent Labs  07/22/13 0550  WBC 6.1  HGB 11.0*  HCT 33.2*  PLT 245    Recent Labs  07/22/13 0550  NA 138  K 3.6  CL 99  GLUCOSE 84  BUN 8  CREATININE 1.02  CALCIUM 9.1   CBG (last 3)  No results found for this basename: GLUCAP,  in the last 72 hours  Wt Readings from Last 3 Encounters:  07/23/13 110.1 kg (242 lb 11.6 oz)  07/21/13 109.3 kg (240 lb 15.4 oz)  07/21/13 109.3 kg (240 lb 15.4 oz)    Physical Exam:  Constitutional: She is oriented to person, place, and time. She appears well-developed. Right leg hanging off the bed Eyes: EOM are normal.  Neck: Normal range of motion. Neck supple. No thyromegaly present.  Cardiovascular: Normal rate and regular rhythm.  Pulmonary/Chest: Effort normal and breath sounds normal. No respiratory distress.  Abdominal: Soft. Bowel sounds are normal. She exhibits no distension.  Neurological: She is alert and oriented to person, place, and time.  Intact insight and awareness. Memory intact.  Left inattention better. Can see just past midline to the left. Left UE grossly 4-/5. LLE is 4+ to 4-/5. Intact touch to pain and general touch. Left central 7 is minimal.  Skin: Drain site clean and dry  Psychiatric: She has a normal mood and affect. Her behavior is normal   Assessment/Plan: 1. Functional deficits secondary to right Temporo-parietal ICH which require 3+ hours per day of interdisciplinary therapy in a comprehensive inpatient rehab setting. Physiatrist is providing close team supervision and 24  hour management of active medical problems listed below. Physiatrist and rehab team continue to assess barriers to discharge/monitor patient progress toward functional and medical goals.    FIM: FIM - Bathing Bathing Steps Patient Completed: Left Arm;Buttocks;Right lower leg (including foot);Left upper leg;Front perineal area;Right Arm;Chest;Abdomen;Right upper leg;Left lower leg (including foot) Bathing: 5: Supervision: Safety issues/verbal cues  FIM - Lower Body Dressing/Undressing Lower body dressing/undressing steps patient completed: Thread/unthread left pants leg;Pull pants up/down;Thread/unthread right pants leg Lower body dressing/undressing: 5: Supervision: Safety issues/verbal cues  FIM - Toileting Toileting steps completed by patient: Adjust clothing prior to toileting;Performs perineal hygiene;Adjust clothing after toileting Toileting Assistive Devices: Grab bar or rail for support Toileting: 4: Steadying assist  FIM - Diplomatic Services operational officer Devices: Grab bars Toilet Transfers: 4-To toilet/BSC: Min A (steadying Pt. > 75%)  FIM - Banker Devices:  (none) Bed/Chair Transfer: 5: Supine > Sit: Supervision (verbal cues/safety issues)  FIM - Locomotion: Wheelchair Distance: 50-75' x 5; Pt req consistent v/c to attend to L side of environment.  Locomotion: Wheelchair: 0: Activity did not occur FIM - Locomotion: Ambulation Locomotion: Ambulation Assistive Devices: Other (comment) (none) Ambulation/Gait Assistance: 4: Min assist Locomotion: Ambulation: 4: Travels 150 ft or more with minimal assistance (Pt.>75%)  Comprehension Comprehension Mode: Auditory Comprehension: 5-Understands complex 90% of the time/Cues < 10% of the time  Expression Expression Mode: Verbal Expression: 5-Expresses basic needs/ideas: With no assist  Social Interaction Social Interaction: 5-Interacts appropriately 90% of the time -  Needs  monitoring or encouragement for participation or interaction.  Problem Solving Problem Solving: 3-Solves basic 50 - 74% of the time/requires cueing 25 - 49% of the time  Memory Memory: 5-Recognizes or recalls 90% of the time/requires cueing < 10% of the time  Medical Problem List and Plan:  1. 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 DVT Prophylaxis/Anticoagulation: can dc lovenox as patient is ambulating regularly with therapy and moving legs well. 3. Pain Management: Tylenol as needed  4. Neuropsych: This patient is capable of making decisions on her own behalf.  5. Seizure prophylaxis. Depakote ER 1000 mg daily. Continue for now  6. Hypertension. Hydrochlorothiazide 12.5 mg daily, lisinopril 20 mg daily. Monitor with increased mobility  7. History of medical noncompliance. Counseling  LOS (Days) 3 A FACE TO FACE EVALUATION WAS PERFORMED  Amaurie Schreckengost T 07/24/2013 7:43 AM

## 2013-07-24 NOTE — Progress Notes (Signed)
Speech Language Pathology Daily Session Note  Patient Details  Name: Alison Ward MRN: [id number] Date of Birth: Oct 02, 1961  Today's Date: 07/24/2013 Time: 4098-1191 Time Calculation (min): 30 min  Short Term Goals: Week 1: SLP Short Term Goal 1 (Week 1): Pt will alternate attention between functional tasks for 30 min with Min cues. SLP Short Term Goal 2 (Week 1): Pt will demonstrate emergent awareness of impairments with Min cues. SLP Short Term Goal 3 (Week 1): Pt will demonstrate basic problem solving during functional task with Min cues. SLP Short Term Goal 4 (Week 1): Pt will attend to her left visual field with Min cues.  Skilled Therapeutic Interventions: Treatment focus on cognitive goals. SLP facilitated session by providing Min A verbal, visual and question cues for scanning/organization task of locating/selecting food items from a menu. Pt also required Mod-Max verbal and visual cues for comprehension of rules to a mildly complex, new learning task.  Pt demonstrated appropriate emergent awareness into difficulty of task.    FIM:  Comprehension Comprehension Mode: Auditory Comprehension: 5-Follows basic conversation/direction: With no assist Expression Expression Mode: Verbal Expression: 5-Expresses basic needs/ideas: With no assist Social Interaction Social Interaction: 6-Interacts appropriately with others with medication or extra time (anti-anxiety, antidepressant). Problem Solving Problem Solving: 5-Solves basic 90% of the time/requires cueing < 10% of the time Memory Memory: 5-Recognizes or recalls 90% of the time/requires cueing < 10% of the time FIM - Eating Eating Activity: 5: Set-up assist for open containers  Pain Pain Assessment Pain Assessment: No/denies pain  Therapy/Group: Individual Therapy  Nayanna Seaborn 07/24/2013, 2:26 PM

## 2013-07-24 NOTE — Progress Notes (Signed)
Occupational Therapy Session Note  Patient Details  Name: Alison Ward MRN: [id number] Date of Birth: [birth year]  Today's Date: 07/24/2013 Time: 1345-1430 Time Calculation (min): 45 min  Short Term Goals: Week 1:  OT Short Term Goal 1 (Week 1): Pt will require supervision for dynamic standing during ADL tasks. OT Short Term Goal 2 (Week 1): Pt will implement visual scanning strategies to the L during functional ambulation with no more than min cues. OT Short Term Goal 3 (Week 1): Pt will complete shower transfer with supervision.  Skilled Therapeutic Interventions/Progress Updates: Therapeutic activities with emphasis on improved visuospatial skills and re-orientation (using scanning and signage), improved memory/recall (topographic), dynamic standing balance during ADL, functional mobility, and endurance.   Patient completed functional task of retrieving her clothing items from laundry which included finding laundry room and returning back to her room to fold and store clothing.   Patient required moderate cues (50% of the time) to find laundry and to re-orient her location in a highly distracting environment.   Patient able to stand at sink to complete oral hygiene with supervision for safety and was then challenged to don shoes and walk outside with OT assist to find main entrance to campus and return to her room using landmarks.   Patient requires constant cues to look the left to follow pathway and steps to entrance.  After resting on park bench, patient again became disoriented to location and required moderate cues to retrace her journey back to rehab floor.     Therapy Documentation Precautions:  Precautions Precautions: Fall Precaution Comments: ventricular shunt  Restrictions Weight Bearing Restrictions: No  Vital Signs: Therapy Vitals Temp: 98.4 F (36.9 C) Temp src: Oral Pulse Rate: 93 Resp: 18 BP: 108/79 mmHg Patient Position, if appropriate: Sitting Oxygen  Therapy SpO2: 99 % O2 Device: None (Room air)  Pain: Pain Assessment Pain Assessment: No/denies pain  See FIM for current functional status  Therapy/Group: Individual Therapy  Georgeanne Nim 07/24/2013, 4:11 PM

## 2013-07-24 NOTE — Progress Notes (Signed)
Physical Therapy Session Note  Patient Details  Name: Alison Ward MRN: [id number] Date of Birth: 09-05-1961  Today's Date: 07/24/2013 Time: 1100-1200 Time Calculation (min): 60 min  Short Term Goals: Week 1:  PT Short Term Goal 1 (Week 1): Pt to demonstrate emergent awarness of L inattention during functional mobility w/ min v/c 50% of time PT Short Term Goal 2 (Week 1): Pt to perform (I) bed mobility in a standard bed PT Short Term Goal 3 (Week 1): Pt to amb 150' w/ LRAD and supervision 75% of time in a busy environment PT Short Term Goal 4 (Week 1): Pt to propel w/c x150' w/ supervision 50% of time in a busy environment PT Short Term Goal 4 - Progress (Week 1): Discontinued (comment) (Ambulation is pt's primary means of mobility)  Skilled Therapeutic Interventions/Progress Updates:    1:1. Pt received supine in bed, alert and ready for therapy. Pt req distant (S) for t/f sup>sit and toileting. Focus majority of tx session on pathfinding and people/obstacle negotiation on unit and off unit in busy hallways, lobby and giftshop. Pt able to safely negotiate on unit w/ supervision, however, benefits from close (S) to min guard in community environments to avoid people/obstacles on L side. Pt bumped into objects 4x this tx session, all off unit, but demonstrates improved awareness of obstacles on L side using compensatory strategies. Pt able to amb >800' w/out AD. Berg Balance Test performed at end of tx session, pt scored 44/56 points and educated on results. Pt sitting in recliner at end of tx session w/ all needs in reach.   Therapy Documentation Precautions:  Precautions Precautions: Fall Precaution Comments: ventricular shunt  Restrictions Weight Bearing Restrictions: No    Pain: Pain Assessment Pain Assessment: No/denies pain   Balance: Balance Balance Assessed: Yes Standardized Balance Assessment Standardized Balance Assessment: Berg Balance Test Berg Balance Test Sit to  Stand: Able to stand without using hands and stabilize independently Standing Unsupported: Able to stand safely 2 minutes Sitting with Back Unsupported but Feet Supported on Floor or Stool: Able to sit safely and securely 2 minutes Stand to Sit: Sits safely with minimal use of hands Transfers: Able to transfer safely, minor use of hands Standing Unsupported with Eyes Closed: Able to stand 10 seconds with supervision Standing Ubsupported with Feet Together: Able to place feet together independently and stand 1 minute safely From Standing, Reach Forward with Outstretched Arm: Can reach forward >12 cm safely (5") From Standing Position, Pick up Object from Floor: Able to pick up shoe, needs supervision From Standing Position, Turn to Look Behind Over each Shoulder: Turn sideways only but maintains balance Turn 360 Degrees: Able to turn 360 degrees safely but slowly Standing Unsupported, Alternately Place Feet on Step/Stool: Able to stand independently and complete 8 steps >20 seconds Standing Unsupported, One Foot in Front: Able to plae foot ahead of the other independently and hold 30 seconds Standing on One Leg: Tries to lift leg/unable to hold 3 seconds but remains standing independently Total Score: 44   Therapy/Group: Individual Therapy  Denzil Hughes 07/24/2013, 12:11 PM

## 2013-07-24 NOTE — Progress Notes (Signed)
Occupational Therapy Session Note  Patient Details  Name: Alison Ward MRN: [id number] Date of Birth: 1961-10-28  Today's Date: 07/24/2013 Time: 1100-1200 Time Calculation (min): 60 min  Short Term Goals: Week 1:  OT Short Term Goal 1 (Week 1): Pt will require supervision for dynamic standing during ADL tasks. OT Short Term Goal 2 (Week 1): Pt will implement visual scanning strategies to the L during functional ambulation with no more than min cues. OT Short Term Goal 3 (Week 1): Pt will complete shower transfer with supervision.  Skilled Therapeutic Interventions/Progress Updates:    Pt engaged in 1:1 self-care session focused on sequencing, L-sided scanning, functional mobility.  Pt ambulated throughout session with no device, minimal verbal cues to visually find items to the left and to avoid contact with obstacles on L when ambulating.  Bathed by sitting to wash feet initially and stand for the remainder of the shower.  Demonstrated dynamic standing balance with supervision level of assist.  No LOB.  Min verbal cues for sequencing for bathing, decreased cues from yesterday's session and increased level of organization with task.   Completed dressing tasks safely from combination of sitting and standing position, frequently verbalized next step to be taken to OT to request approval.  Ambulated to laundry room to wash clothes, min cues to read directional signs to find desired locations.  Able to locate 2 out of 2 locations with min cues to scan to the L. No complaints of pain.   Therapy Documentation Precautions:  Precautions Precautions: Fall Precaution Comments: ventricular shunt  Restrictions Weight Bearing Restrictions: No    Pain: Pain Assessment Pain Assessment: No/denies pain  See FIM for current functional status  Therapy/Group: Individual Therapy  Kandis Ban 07/24/2013, 12:48 PM

## 2013-07-25 ENCOUNTER — Inpatient Hospital Stay (HOSPITAL_COMMUNITY): Payer: Self-pay | Admitting: Speech Pathology

## 2013-07-25 ENCOUNTER — Inpatient Hospital Stay (HOSPITAL_COMMUNITY): Payer: Self-pay

## 2013-07-25 NOTE — IPOC Note (Signed)
Overall Plan of Care [facility]) Patient Details Name: Alison Ward MRN: [id number] DOB: [birth year]  Admitting Diagnosis: R ICH  Hospital Problems: Principal Problem:   Acute hemorrhagic infarction of brain     Functional Problem List: Nursing Endurance;Pain;Medication Management  PT Balance;Endurance;Perception;Safety  OT Balance;Cognition;Perception;Safety  SLP Cognition  TR         Basic ADL's: OT Eating;Grooming;Bathing;Dressing;Toileting     Advanced  ADL's: OT       Transfers: PT Bed Mobility;Bed to Chair;Car;Furniture;Floor  OT Toilet;Tub/Shower     Locomotion: PT Ambulation;Wheelchair Mobility;Stairs     Additional Impairments: OT    SLP Social Cognition   Problem Solving;Memory;Attention;Awareness  TR      Anticipated Outcomes Item Anticipated Outcome  Self Feeding mod I  Swallowing      Basic self-care  mod I  Toileting  mod I   Bathroom Transfers mod I  Bowel/Bladder  Independent  Transfers  Mod(I)  Locomotion  Mod(I)  Communication     Cognition  Supervision  Pain  pain not greater than 3 on a scale of 1-10   Safety/Judgment  Independent   Therapy Plan: PT Intensity: Minimum of 1-2 x/day ,45 to 90 minutes PT Frequency: 5 out of 7 days PT Duration Estimated Length of Stay: 10-14days OT Intensity: Minimum of 1-2 x/day, 45 to 90 minutes OT Frequency: 5 out of 7 days OT Duration/Estimated Length of Stay: 10 days SLP Intensity: Minumum of 1-2 x/day, 30 to 90 minutes SLP Frequency: 5 out of 7 days SLP Duration/Estimated Length of Stay: 2 weeks       Team Interventions: Nursing Interventions Patient/Family Education;Disease Management/Prevention;Pain Management;Medication Management;Skin Care/Wound Management;Discharge Planning  PT interventions Ambulation/gait training;Balance/vestibular training;Cognitive remediation/compensation;Disease management/prevention;Discharge planning;Community reintegration;DME/adaptive equipment  instruction;Functional mobility training;Patient/family education;Pain management;Neuromuscular re-education;Psychosocial support;Skin care/wound management;Therapeutic Exercise;Therapeutic Activities;Stair training;UE/LE Strength taining/ROM;UE/LE Coordination activities;Visual/perceptual remediation/compensation;Wheelchair propulsion/positioning  OT Interventions Balance/vestibular training;Cognitive Engineer, maintenance;Discharge planning;Functional mobility training;Therapeutic Activities;UE/LE Strength taining/ROM;Visual/perceptual remediation/compensation;UE/LE Coordination activities;Therapeutic Exercise;Self Care/advanced ADL retraining;Patient/family education;Disease mangement/prevention  SLP Interventions Cognitive remediation/compensation;Cueing hierarchy;Environmental controls;Functional tasks;Internal/external aids;Medication managment;Patient/family education  TR Interventions    SW/CM Interventions Discharge Planning;Psychosocial Support;Patient/Family Education    Team Discharge Planning: Destination: PT-Home ,OT- Home , SLP-Home Projected Follow-up: PT-Home health PT;Outpatient PT, OT-  Home health OT, SLP-Outpatient SLP;24 hour supervision/assistance Projected Equipment Needs: PT- , OT- Tub/shower bench, SLP-None recommended by SLP Patient/family involved in discharge planning: PT- Patient,  OT-Patient, SLP-Patient;Family member/caregiver  MD ELOS: 10 days Medical Rehab Prognosis:  Excellent Assessment: The patient has been admitted for CIR therapies. The team will be addressing, functional mobility, strength, stamina, balance, safety, adaptive techniques/equipment, self-care, bowel and bladder mgt, patient and caregiver education, visual perceptual awareness, cognition. Goals have been set at mod I to occasional supervision.    Ranelle Oyster, MD, FAAPMR      See Team Conference Notes for weekly updates to the plan of care

## 2013-07-25 NOTE — Progress Notes (Signed)
Occupational Therapy Session Note  Patient Details  Name: Alison Ward MRN: [id number] Date of Birth: July 03, 1961  Today's Date: 07/25/2013 Time: 0930-1028 Time Calculation (min): 58 min  Short Term Goals: Week 1:  OT Short Term Goal 1 (Week 1): Pt will require supervision for dynamic standing during ADL tasks. OT Short Term Goal 2 (Week 1): Pt will implement visual scanning strategies to the L during functional ambulation with no more than min cues. OT Short Term Goal 3 (Week 1): Pt will complete shower transfer with supervision.  Skilled Therapeutic Interventions/Progress Updates: ADL-retraining with emphasis on problem-solving, sequencing, attention to left, and dynamic standing balance.  Patient completed bathing and dressing to include gathering clothing and supplies, performing tasks both seated and standing, using tub bench and grab bars as needed with only distant supervision for safety.  Patient has retained skills with orientation to room and facilities but requires assist with setting water temperature and with providing additional washcloths and towels, when requested.     Therapy Documentation Precautions:  Precautions Precautions: Fall Precaution Comments: ventricular shunt  Restrictions Weight Bearing Restrictions: No  Pain: Pain Assessment Pain Assessment: No/denies pain Pain Score: 0-No pain  See FIM for current functional status  Therapy/Group: Individual Therapy  Second session: Time: 1300-1403 Time Calculation (min):  63 min  Pain Assessment: No report of pain  Skilled Therapeutic Interventions: Therapeutic activities with emphasis on discharge planning, compensatory strategies to improve attention to topographic features, memory, and performance of iADL.   Patient completed laundry task with supervision to attend to specific directions of machine use during wash and dry cycles.   Patient then completed 25 minutes of functional mobility from [place] to [employer] of campus using topographic cues and minimal questioning cues to re-orient and re-direct.   Patient demonstrates awareness of limitations due to visuospatial deficits but verbalized understanding of recommendations to reduce confusion/distractions and to alert family members to need for sensitivity to timing of planned community events/outings (church and dining out) and to allow patient to acquire skills with orienting herself prior to event/outing.   See FIM for current functional status  Therapy/Group: Individual Therapy  Georgeanne Nim 07/25/2013, 10:31 AM

## 2013-07-25 NOTE — Progress Notes (Signed)
This note has been reviewed and this clinician agrees with information provided.  

## 2013-07-25 NOTE — Progress Notes (Signed)
Physical Therapy Session Note  Patient Details  Name: [name] MRN: [id number] Date of Birth: [birth year]  Today's Date: 07/25/2013 Time: 1115-1200 Time Calculation (min): 45 min  Short Term Goals: Week 1:  PT Short Term Goal 1 (Week 1): STGs=LTGs  Skilled Therapeutic Interventions/Progress Updates:  1:1. Focus this tx session on pathfinding, organizing and attending to L side of environment while amb >800' on and off unit. At start of tx session pt organized a list of 10 items that would be found in the lobby and therapy kitchen. Pt able to navigate to lobby and kitchen by utilizing signs and locate listed items w/ min-mod v/c specifically to attend to L side of environment when pt was unable to locate an item. Pt demonstrated good flexibility to substitute for similar item if specified item not on list. Pt also w/ good ability to [place] w/ use of signs/memory and min vc. Pt w/ improved attention to L side of environment today as noted by only bumping into 1 item on L side vs. 4 items yesterday. Pt sitting in recliner in room at end of tx session w/ all needs in reach.   Therapy Documentation Precautions:  Precautions Precautions: Fall Precaution Comments: ventricular shunt  Restrictions Weight Bearing Restrictions: No   Pain: Pain Assessment Pain Assessment: No/denies pain Pain Score: 0-No pain  See FIM for current functional status  Therapy/Group: Individual Therapy  Denzil Hughes 07/25/2013, 12:02 PM

## 2013-07-25 NOTE — Progress Notes (Signed)
Speech Language Pathology Daily Session Note  Patient Details  Name: Alison Ward MRN: [id number] Date of Birth: [birth year]  Today's Date: 07/25/2013 Time: 7829-5621 Time Calculation (min): 45 min  Short Term Goals: Week 1: SLP Short Term Goal 1 (Week 1): Pt will alternate attention between functional tasks for 30 min with Min cues. SLP Short Term Goal 2 (Week 1): Pt will demonstrate emergent awareness of impairments with Min cues. SLP Short Term Goal 3 (Week 1): Pt will demonstrate basic problem solving during functional task with Min cues. SLP Short Term Goal 4 (Week 1): Pt will attend to her left visual field with Min cues.  Skilled Therapeutic Interventions: Treatment focus on cognitive goals. SLP facilitated session by providing Mod A question cues for functional problem solving and organization with a mildly complex scheduling task and required Min verbal cues to attend to left field of environment throughout the task.Pt also required Min A verbal and visual cues for 6 step sequencing task with picture cards. Pt demonstrated appropriate emergent awareness into difficulty of task.   FIM:  Comprehension Comprehension Mode: Auditory Comprehension: 5-Understands complex 90% of the time/Cues < 10% of the time Expression Expression Mode: Verbal Expression: 5-Expresses complex 90% of the time/cues < 10% of the time Social Interaction Social Interaction: 6-Interacts appropriately with others with medication or extra time (anti-anxiety, antidepressant). Problem Solving Problem Solving: 5-Solves basic 90% of the time/requires cueing < 10% of the time Memory Memory: 5-Recognizes or recalls 90% of the time/requires cueing < 10% of the time  Pain Pain Assessment Pain Assessment: No/denies pain  Therapy/Group: Individual Therapy  [name] 07/25/2013, 6:39 PM

## 2013-07-25 NOTE — Progress Notes (Signed)
Social Work Social Work Assessment and Plan  Patient Details  Name: Alison Ward MRN: [id number] Date of Birth: 29-Aug-1961  Today's Date: 07/25/2013  Problem List:  Patient Active Problem List   Diagnosis Date Noted  . 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 ICH (intracerebral hemorrhage) 07/21/2013  . Hypokalemia 07/21/2013  . Intracerebral hemorrhage 07/06/2013  . HYPERTENSION 02/24/2009   Past Medical History:  Past Medical History  Diagnosis Date  . Hypertension    Past Surgical History:  Past Surgical History  Procedure Laterality Date  . Ventriculostomy Left 07/07/2013    Procedure: VENTRICULOSTOMY;  Surgeon: Alison Loron, MD;  Location: [facility];  Service: Neurosurgery;  Laterality: Left;  Left Ventriculostomy and Removal of Right Vetriculostomy   Social History:  reports that she has never smoked. She has never used smokeless tobacco. She reports that she does not drink alcohol or use illicit drugs.  Family / Support Systems Marital Status: Divorced Patient Roles: Parent (Has 2 Dtrs and 1 son.) Children: 3 adult children:  daughters Alison Ward and Alison Ward both local and son living in [place]. Alison Ward.  Daughter, Alison Ward @ [phone] Other Supports: pt's mother, Alison Ward - also local Anticipated Caregiver: Dtr Alison Ward and patient's mother Ability/Limitations of Caregiver: Dtr not working.  She recently quit her job to care for a 1 yr old.  Mom is not working and can assist. Medical laboratory scientific officer: 24/7 Family Dynamics: pt describes all family as very supportive and willing to assist  Social History Preferred language: English Religion: Apostolic Cultural Background: Pt originally from [place] the [place] and moved to [place] approx 15 yrs ago. 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  She admits frustration with current limitations, however, denies any s/s of depression or anxiety.  Do not feel formal depression screen is warranted at this time but will monitor. Recent Psychosocial Issues: None Pyschiatric History: None Substance Abuse History: None  Patient / Family Perceptions, Expectations & Goals Pt/Family understanding of illness & functional limitations: Pt with good, basic understanding of her stroke/ hemorrhage and current limitations/ need for CIR. Premorbid pt/family roles/activities: Pt completely independent and working.  No limitations. Anticipated changes in roles/activities/participation: mother and daughters to assume some caregiver roles, however, should be minimal if good gains made. Pt/family expectations/goals: Pt hopes to try and return to work if able, however, concerned about her cognitive abilities to manage the type of work she was doing which required a lot of multi-tasking.  Community Resources [employer]: None Premorbid Home Care/DME Agencies: None Transportation available at discharge: yes Resource referrals recommended: Neuropsychology  Discharge Planning Living Arrangements: Alone Support Systems: Children;Parent Type of Residence: Private residence Insurance Resources: Customer service manager Resources: Employment Financial Screen Referred: Previously completed Living Expenses: Psychologist, sport and exercise Management: Patient Does the patient have any problems obtaining your medications?: Yes (Describe) (No insurance) Home Management: pt Patient/Family Preliminary Plans: Pt plans to d/c home with her mother who can provide 24/7 supervision Social Work Anticipated Follow Up Needs:  HH/OP;Support Group;Other (comment) (SSD applicaiton assist, medical and medication assist progra) Expected length of stay: 2 weeks  Clinical Impression Very pleasant, oriented woman here following hemorrhagic  stroke.  Making good gains and anticipating short LOS.  Good family support.  No s/s of any emotional distress.  May benefit from neuropsych testing to help determine return to work possiblities.  Alison Ward 07/25/2013, 8:48 AM

## 2013-07-25 NOTE — Progress Notes (Signed)
Subjective/Complaints: Has some mild lateral thigh pain bilaterally. Otherwise feeling well.  A 12 point review of systems has been performed and if not noted above is otherwise negative.   Objective: Vital Signs: Blood pressure 129/83, pulse 68, temperature 98.7 F (37.1 C), temperature source Oral, resp. rate 18, height 5\' 8"  (1.727 m), weight 110.1 kg (242 lb 11.6 oz), last menstrual period 11/24/2011, SpO2 98.00%. No results found. No results found for this basename: WBC, HGB, HCT, PLT,  in the last 72 hours No results found for this basename: NA, K, CL, CO, GLUCOSE, BUN, CREATININE, CALCIUM,  in the last 72 hours CBG (last 3)  No results found for this basename: GLUCAP,  in the last 72 hours  Wt Readings from Last 3 Encounters:  07/23/13 110.1 kg (242 lb 11.6 oz)  07/21/13 109.3 kg (240 lb 15.4 oz)  07/21/13 109.3 kg (240 lb 15.4 oz)    Physical Exam:  Constitutional: She is oriented to person, place, and time. She appears well-developed. Right leg hanging off the bed Eyes: EOM are normal.  Neck: Normal range of motion. Neck supple. No thyromegaly present.  Cardiovascular: Normal rate and regular rhythm.  Pulmonary/Chest: Effort normal and breath sounds normal. No respiratory distress.  Abdominal: Soft. Bowel sounds are normal. She exhibits no distension.  Neurological: She is alert and oriented to person, place, and time.  Intact insight and awareness. Memory intact.  Left inattention better. Can see just past midline to the left. Left UE grossly 4-/5. LLE is 4+ to 4-/5. Intact touch to pain and general touch. Left central 7 is minimal.  Musc: mild pain in TFL's with cross legged maneuvers Skin: Drain site clean and dry  Psychiatric: She has a normal mood and affect. Her behavior is normal   Assessment/Plan: 1. Functional deficits secondary to right Temporo-parietal ICH which require 3+ hours per day of interdisciplinary therapy in a comprehensive inpatient rehab  setting. Physiatrist is providing close team supervision and 24 hour management of active medical problems listed below. Physiatrist and rehab team continue to assess barriers to discharge/monitor patient progress toward functional and medical goals.  Continues to progress nicely with therapies    FIM: FIM - Bathing Bathing Steps Patient Completed: Left Arm;Buttocks;Right lower leg (including foot);Left upper leg;Front perineal area;Right Arm;Chest;Abdomen;Right upper leg;Left lower leg (including foot) Bathing: 5: Supervision: Safety issues/verbal cues  FIM - Upper Body Dressing/Undressing Upper body dressing/undressing steps patient completed: Thread/unthread right sleeve of pullover shirt/dresss;Thread/unthread left sleeve of pullover shirt/dress;Put head through opening of pull over shirt/dress;Pull shirt over trunk Upper body dressing/undressing: 5: Supervision: Safety issues/verbal cues FIM - Lower Body Dressing/Undressing Lower body dressing/undressing steps patient completed: Thread/unthread left pants leg;Pull pants up/down;Thread/unthread right pants leg;Don/Doff left sock;Don/Doff right sock;Don/Doff left shoe;Don/Doff right shoe;Fasten/unfasten left shoe;Fasten/unfasten right shoe Lower body dressing/undressing: 5: Supervision: Safety issues/verbal cues  FIM - Toileting Toileting steps completed by patient: Adjust clothing prior to toileting;Performs perineal hygiene;Adjust clothing after toileting Toileting Assistive Devices: Grab bar or rail for support Toileting: 5: Supervision: Safety issues/verbal cues  FIM - Diplomatic Services operational officer Devices: Grab bars Toilet Transfers: 5-To toilet/BSC: Supervision (verbal cues/safety issues);5-From toilet/BSC: Supervision (verbal cues/safety issues)  FIM - Banker Devices:  (none) Bed/Chair Transfer: 5: Supine > Sit: Supervision (verbal cues/safety issues);5: Sit > Supine:  Supervision (verbal cues/safety issues)  FIM - Locomotion: Wheelchair Distance: 50-75' x 5; Pt req consistent v/c to attend to L side of environment.  Locomotion: Wheelchair: 0: Activity did not  occur FIM - Locomotion: Ambulation Locomotion: Ambulation Assistive Devices: Other (comment) (none) Ambulation/Gait Assistance: 4: Min assist Locomotion: Ambulation: 5: Travels 150 ft or more with supervision/safety issues  Comprehension Comprehension Mode: Auditory Comprehension: 5-Follows basic conversation/direction: With no assist  Expression Expression Mode: Verbal Expression: 6-Expresses complex ideas: With extra time/assistive device  Social Interaction Social Interaction: 6-Interacts appropriately with others with medication or extra time (anti-anxiety, antidepressant).  Problem Solving Problem Solving: 5-Solves basic 90% of the time/requires cueing < 10% of the time  Memory Memory: 5-Recognizes or recalls 90% of the time/requires cueing < 10% of the time  Medical Problem List and Plan:  1. 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 DVT Prophylaxis/Anticoagulation: can dc lovenox as patient is ambulating regularly with therapy and moving legs well. 3. Pain Management: Tylenol as needed. Apply heat prn to thighs  -think pain might be mild strain of TFL's 4. Neuropsych: This patient is capable of making decisions on her own behalf.  5. Seizure prophylaxis. Depakote ER 1000 mg daily. Continue for now  6. Hypertension. Hydrochlorothiazide 12.5 mg daily, lisinopril 20 mg daily. Monitor with increased mobility  7. History of medical noncompliance. Counseling  LOS (Days) 4 A FACE TO FACE EVALUATION WAS PERFORMED  SWARTZ,ZACHARY T 07/25/2013 7:46 AM

## 2013-07-25 NOTE — Progress Notes (Signed)
[facility] Individual Statement of Services  Patient Name:  Alison Ward  Date:  07/25/2013  [facility].  Our goal is to provide you with an individualized program based on your diagnosis and situation, designed to meet your specific needs.  With this comprehensive rehabilitation program, you will be expected to participate in at least 3 hours of rehabilitation therapies Monday-Friday, with modified therapy programming on the weekends.  Your rehabilitation program will include the following services:  Physical Therapy (PT), Occupational Therapy (OT), Speech Therapy (ST), 24 hour per day rehabilitation nursing, Neuropsychology, Case Management (Social Worker), Rehabilitation Medicine, Nutrition Services and Pharmacy Services  Weekly team conferences will be held on Tuesdays to discuss your progress.  Your Social Worker will talk with you frequently to get your input and to update you on team discussions.  Team conferences with you and your family in attendance may also be held.  Expected length of stay: 1-2 weeks  Overall anticipated outcome: modified independent  Depending on your progress and recovery, your program may change. Your Social Worker will coordinate services and will keep you informed of any changes. Your Social Worker's name and contact numbers are listed  below.  The following services may also be recommended but are not provided by the [facility]:   Driving Evaluations  Home Health Rehabiltiation Services  Outpatient Rehabilitation Services  Vocational Rehabilitation   Arrangements will be made to provide these services after discharge if needed.  Arrangements include referral to agencies that provide these services.  Your insurance has been verified to be:  None Your primary doctor is:  None (SW to assist with this)  Pertinent information will be shared with your doctor and your  insurance company.  Social Worker:  [place], [place] [phone] or ([phone]   Information discussed with and copy given to patient by: Amada Jupiter, 07/25/2013, 8:51 AM

## 2013-07-26 ENCOUNTER — Inpatient Hospital Stay (HOSPITAL_COMMUNITY): Payer: Self-pay | Admitting: Occupational Therapy

## 2013-07-26 ENCOUNTER — Inpatient Hospital Stay (HOSPITAL_COMMUNITY): Payer: Self-pay | Admitting: Physical Therapy

## 2013-07-26 ENCOUNTER — Inpatient Hospital Stay (HOSPITAL_COMMUNITY): Payer: Self-pay | Admitting: Speech Pathology

## 2013-07-26 NOTE — Progress Notes (Signed)
Patient ID: Alison Ward, female   DOB: [birth year], 52 y.o.   MRN: [id number] Subjective/Complaints: Denies pain or breathing problems - feeling well.   Objective: Vital Signs: Blood pressure 123/79, pulse 64, temperature 98.2 F (36.8 C), temperature source Oral, resp. rate 16, height 5\' 8"  (1.727 m), weight 110.1 kg (242 lb 11.6 oz), last menstrual period 11/24/2011, SpO2 100.00%. No results found. No results found for this basename: WBC, HGB, HCT, PLT,  in the last 72 hours No results found for this basename: NA, K, CL, CO, GLUCOSE, BUN, CREATININE, CALCIUM,  in the last 72 hours CBG (last 3)  No results found for this basename: GLUCAP,  in the last 72 hours  Wt Readings from Last 3 Encounters:  07/23/13 110.1 kg (242 lb 11.6 oz)  07/21/13 109.3 kg (240 lb 15.4 oz)  07/21/13 109.3 kg (240 lb 15.4 oz)    Physical Exam:  Constitutional: She is oriented to person, place, and time. She appears well-developed Neck: Normal range of motion. Neck supple. No thyromegaly present.  Cardiovascular: Normal rate and regular rhythm.  Pulmonary/Chest: Effort normal and breath sounds normal. No respiratory distress.  Neurological: She is alert and oriented to person, place, and time.  Intact insight and awareness. Memory intact.  Left inattention present.  Left UE grossly 4-/5. LLE is 4+ to 4-/5. Intact touch to pain and general touch. Left central 7 is minimal.  Skin: Drain site clean and dry -scalp healing well Psychiatric: She has a normal mood and affect. Her behavior is normal   Assessment/Plan: 1. Functional deficits secondary to right Temporo-parietal ICH which require 3+ hours per day of interdisciplinary therapy in a comprehensive inpatient rehab setting. Physiatrist is providing close team supervision and 24 hour management of active medical problems listed below. Physiatrist and rehab team continue to assess barriers to discharge/monitor patient progress toward functional and medical  goals.  Continues to progress nicely with therapies   Medical Problem List and Plan:  1. Right temporoparietal ICH. Status post burr hole ventriculostomy 07/06/13 2. DVT Prophylaxis/Anticoagulation: off lovenox as patient is ambulating regularly with therapy and moving legs well. 3. Pain Management: Tylenol as needed. 4. Neuropsych: This patient is capable of making decisions on her own behalf.  5. Seizure prophylaxis. Depakote ER 1000 mg daily. Continue for now  6. Hypertension. Hydrochlorothiazide 12.5 mg daily, lisinopril 20 mg daily. Monitor with increased mobility  7. History of medical noncompliance. Counseling  LOS (Days) 5 A FACE TO FACE EVALUATION WAS PERFORMED  Sary Bogie 07/26/2013 11:22 AM

## 2013-07-26 NOTE — Progress Notes (Signed)
Physical Therapy Session Note  Patient Details  Name: Alison Ward MRN: [id number] Date of Birth: 1960/10/31  Today's Date: 07/26/2013 Time: 1031-1129 Time Calculation (min): 58 min  Short Term Goals: Week 1:  PT Short Term Goal 1 (Week 1): STGs=LTGs PT Short Term Goal 2 (Week 1): Pt to perform (I) bed mobility in a standard bed PT Short Term Goal 3 (Week 1): Pt to amb 150' w/ LRAD and supervision 75% of time in a busy environment PT Short Term Goal 4 (Week 1): Pt to propel w/c x150' w/ supervision 50% of time in a busy environment PT Short Term Goal 4 - Progress (Week 1): Discontinued (comment) (Ambulation is pt's primary means of mobility)  Skilled Therapeutic Interventions/Progress Updates:  Pt was seen bedside in the am. Pt already to maneuver around room with S. Pt able to don shoes and socks with S. Pt ambulated greater 1000 feet with S and occasional verbal cues for directions and problem solving. In gym treatment focused on LE strengthening, exercises including standing cone taps, mini squats, and heel lifts. Pt rode Nu-step x10 mins at level 4, greater than 50 steps.    Therapy Documentation Precautions:  Precautions Precautions: Fall Precaution Comments: ventricular shunt  Restrictions Weight Bearing Restrictions: No General:   Pain: No c/o pain.   Locomotion : Ambulation Ambulation/Gait Assistance: 5: Supervision    See FIM for current functional status  Therapy/Group: Individual Therapy  Rayford Halsted 07/26/2013, 12:48 PM

## 2013-07-26 NOTE — Progress Notes (Signed)
Physical Therapy Session Note  Patient Details  Name: Alison Ward MRN: [id number] Date of Birth: October 02, 1961  Today's Date: 07/26/2013 Time: 4098-1191 Time Calculation (min): 55 min  Short Term Goals: Week 1:  PT Short Term Goal 1 (Week 1): STGs=LTGs PT Short Term Goal 2 (Week 1): Pt to perform (I) bed mobility in a standard bed PT Short Term Goal 3 (Week 1): Pt to amb 150' w/ LRAD and supervision 75% of time in a busy environment PT Short Term Goal 4 (Week 1): Pt to propel w/c x150' w/ supervision 50% of time in a busy environment PT Short Term Goal 4 - Progress (Week 1): Discontinued (comment) (Ambulation is pt's primary means of mobility)  Skilled Therapeutic Interventions/Progress Updates:   Pt was seen bedside in the pm. Pt ambulated over 2000 feet throughout the hospital with S and verbal cues for left side inattention and problem solving. While ambulating pt bumped into one object.  Cone taps with 8" cones for balance.   Therapy Documentation Precautions:  Precautions Precautions: Fall Precaution Comments: ventricular shunt  Restrictions Weight Bearing Restrictions: No General:   Pain: No c/o pain.   Locomotion : Ambulation Ambulation/Gait Assistance: 5: Supervision   See FIM for current functional status  Therapy/Group: Individual Therapy  Rayford Halsted 07/26/2013, 3:33 PM

## 2013-07-26 NOTE — Progress Notes (Signed)
Occupational Therapy Session Note  Patient Details  Name: Alison Ward MRN: [id number] Date of Birth: 1961-01-19  Today's Date: 07/26/2013 Time: 1400-1445 Time Calculation (min): 45 min   Skilled Therapeutic Interventions/Progress Updates:    Patient seen this pm for individual OT session to address self organizing behaviors with visual scanning task.  Patient very engaging, yet unable to hold onto 2 step directives without moderate cueing.  Patient able to verbally state areas of impairment since recent hospitalization, and could often even discuss strategies to overcome such impairments, yet had great difficulty actually implementing.  Patient could use short term strategies offered with over emphasis, and brief amount of time for recall (2 minutes.)   Patient did state that she was concerned about her ability to get into and out of the shower at her mother's house.  She was able to locate the apartment with max cueing, and determine that she could safely transition into and out of a tub/shower without a tub bench or use of grab bars.    Therapy Documentation Precautions:  Precautions Precautions: Fall Precaution Comments: ventricular shunt  Restrictions Weight Bearing Restrictions: No   See FIM for current functional status  Therapy/Group: Individual Therapy  Collier Salina 07/26/2013, 3:34 PM

## 2013-07-26 NOTE — Progress Notes (Signed)
Speech Language Pathology Daily Session Note  Patient Details  Name: Alison Ward MRN: [id number] Date of Birth: [birth year]  Today's Date: 07/26/2013 Time: 4098-1191 Time Calculation (min): 30 min  Short Term Goals: Week 1: SLP Short Term Goal 1 (Week 1): Pt will alternate attention between functional tasks for 30 min with Min cues. SLP Short Term Goal 2 (Week 1): Pt will demonstrate emergent awareness of impairments with Min cues. SLP Short Term Goal 3 (Week 1): Pt will demonstrate basic problem solving during functional task with Min cues. SLP Short Term Goal 4 (Week 1): Pt will attend to her left visual field with Min cues.  Skilled Therapeutic Interventions: Skilled treatment session focused on addressing cognition goals.  SLP facilitated session with recall of new information with use of external memory aid; patient required Max faded to Mod verbal cues to utilize organization and sequencing strategies during a structured task.  Continue with current plan of care.   FIM:  Comprehension Comprehension Mode: Auditory Comprehension: 5-Understands complex 90% of the time/Cues < 10% of the time Expression Expression Mode: Verbal Expression: 5-Expresses complex 90% of the time/cues < 10% of the time Social Interaction Social Interaction: 6-Interacts appropriately with others with medication or extra time (anti-anxiety, antidepressant). Problem Solving Problem Solving: 5-Solves basic 90% of the time/requires cueing < 10% of the time Memory Memory: 4-Recognizes or recalls 75 - 89% of the time/requires cueing 10 - 24% of the time FIM - Eating Eating Activity: 5: Set-up assist for open containers  Pain Pain Assessment Pain Assessment: No/denies pain  Therapy/Group: Individual Therapy  Charlane Ferretti., CCC-SLP [phone]  Alison Ward 07/26/2013, 3:57 PM

## 2013-07-27 ENCOUNTER — Inpatient Hospital Stay (HOSPITAL_COMMUNITY): Payer: Self-pay

## 2013-07-27 DIAGNOSIS — I619 Nontraumatic intracerebral hemorrhage, unspecified: Secondary | ICD-10-CM

## 2013-07-27 NOTE — Progress Notes (Signed)
Patient ID: Alison Ward, female   DOB: [birth year], 52 y.o.   MRN: [id number]  Subjective/Complaints: Walking in hall with PT. Denies pain or breathing problems - feeling well.   Objective: Vital Signs: Blood pressure 130/84, pulse 65, temperature 97.5 F (36.4 C), temperature source Oral, resp. rate 17, height 5\' 8"  (1.727 m), weight 110.1 kg (242 lb 11.6 oz), last menstrual period 11/24/2011, SpO2 95.00%. No results found. No results found for this basename: WBC, HGB, HCT, PLT,  in the last 72 hours No results found for this basename: NA, K, CL, CO, GLUCOSE, BUN, CREATININE, CALCIUM,  in the last 72 hours CBG (last 3)  No results found for this basename: GLUCAP,  in the last 72 hours  Wt Readings from Last 3 Encounters:  07/23/13 110.1 kg (242 lb 11.6 oz)  07/21/13 109.3 kg (240 lb 15.4 oz)  07/21/13 109.3 kg (240 lb 15.4 oz)    Physical Exam:  Constitutional: She is oriented to person, place, and time. She appears well-developed Neck: Normal range of motion. Neck supple. No thyromegaly present.  Cardiovascular: Normal rate and regular rhythm.  Pulmonary/Chest: Effort normal and breath sounds normal. No respiratory distress.  Neurological: She is alert and oriented to person, place, and time.  Intact insight and awareness. Memory intact.  Left inattention present.  Left UE grossly 4-/5. LLE is 4+ to 4-/5. Intact touch to pain and general touch. Left central 7 is minimal.  Skin: Drain site clean and dry -scalp healing well Psychiatric: She has a normal mood and affect. Her behavior is normal   Assessment/Plan: 1. Functional deficits secondary to right Temporo-parietal ICH which require 3+ hours per day of interdisciplinary therapy in a comprehensive inpatient rehab setting. Physiatrist is providing close team supervision and 24 hour management of active medical problems listed below. Physiatrist and rehab team continue to assess barriers to discharge/monitor patient progress  toward functional and medical goals.  Continues to progress nicely with therapies   Medical Problem List and Plan:  1. Right temporoparietal ICH. Status post burr hole ventriculostomy 07/06/13 2. DVT Prophylaxis/Anticoagulation: off lovenox as patient is ambulating regularly with therapy and moving legs well. 3. Pain Management: Tylenol as needed. 4. Neuropsych: This patient is capable of making decisions on her own behalf.  5. Seizure prophylaxis. Depakote ER 1000 mg daily. Continue for now  6. Hypertension. Hydrochlorothiazide 12.5 mg daily, lisinopril 20 mg daily. Monitor with increased mobility  7. History of medical noncompliance. Counseling  LOS (Days) 6 A FACE TO FACE EVALUATION WAS PERFORMED  Kimmi Acocella 07/27/2013 9:49 AM

## 2013-07-27 NOTE — Progress Notes (Signed)
Physical Therapy Session Note  Patient Details  Name: Alison Ward MRN: [id number] Date of Birth: 1961-05-28  Today's Date: 07/27/2013 Time: 4098-1191 Time Calculation (min): 42 min  Short Term Goals: Week 1:  PT Short Term Goal 1 (Week 1): STGs=LTGs PT Short Term Goal 2 (Week 1): Pt to perform (I) bed mobility in a standard bed PT Short Term Goal 3 (Week 1): Pt to amb 150' w/ LRAD and supervision 75% of time in a busy environment PT Short Term Goal 4 (Week 1): Pt to propel w/c x150' w/ supervision 50% of time in a busy environment PT Short Term Goal 4 - Progress (Week 1): Discontinued (comment) (Ambulation is pt's primary means of mobility)  Skilled Therapeutic Interventions/Progress Updates:   Discussed with primary OT and made pt modified independent in the room - pt aware and verbalized understanding that she still needs S out of the room. Pt used bathroom and then donned socks/shoes independently before leaving room. Session focused on dynamic gait through hospital and outside in [place] patio with focus on pathfinding new (solarium) and old (laundry room to collect her clothes from dryer from previous session) ways using signs posted. When pt made a wrong turn, able to identify and correct direction with extra time only (confirmation given by therapist when pt asking for clarification). Discussed d/c plan (home with mother) and pt with no concerns about this but reports her mom is coming for education tomorrow with therapy. Pt reports that she feels like she has gained a lot of confidence and is very thankful for her experience here.   Therapy Documentation Precautions:  Precautions Precautions: Fall Precaution Comments: ventricular shunt  Restrictions Weight Bearing Restrictions: No    Pain: Denies pain.  See FIM for current functional status  Therapy/Group: Individual Therapy  Karolee Stamps [facility] 07/27/2013, 3:09 PM

## 2013-07-27 NOTE — Progress Notes (Addendum)
Physical Therapy Session Note  Patient Details  Name: Alison Ward MRN: [id number] Date of Birth: [birth year]  Today's Date: 07/27/2013  Short Term Goals: Week 1:  PT Short Term Goal 1 (Week 1): STGs=LTGs PT Short Term Goal 2 (Week 1): Pt to perform (I) bed mobility in a standard bed PT Short Term Goal 3 (Week 1): Pt to amb 150' w/ LRAD and supervision 75% of time in a busy environment PT Short Term Goal 4 (Week 1): Pt to propel w/c x150' w/ supervision 50% of time in a busy environment PT Short Term Goal 4 - Progress (Week 1): Discontinued (comment) (Ambulation is pt's primary means of mobility)  Session #1: Time: 4098-1191 Time Calculation (min): 28 min Individual therapy; Treatment session focused on therapeutic activities to work on organization, memory, problem solving, and functional mobility. Pt gathered her clothing and dressed with S; extra time to organize sequence (put socks and shoes on before she had her pants on but independently able to determine she had to go back and reverse the order). Made a list (pt wrote it down) for 5 places we will visit today during our sessions. Laundry was first, and pt working on pathfinding with extra time and only 1 cue needed to locate laundry room and able to navigate back to her room with extra time. Difficulty recalling all 5 items on list from memory but with extra time and questioning cues able to recall 5/5.  Session #2: Time: 1030-1129 (59 minutes) Individual therapy; No complaints of pain. Session focused on dynamic and functional gait with pathfinding activities to complete list made in earlier session and added tasks all over the hospital and outdoors. Pt required S to min verbal  Cues at times but demonstrated improvement and good carryover even from earlier this morning. Stair negotiation with rails and without rails overall S in preparation for community mobility (her mother's house does not have stairs per report). Dynamic gait with  retro-gait and sidestepping while completing cognitive tasks; no LOB but slower cadence noted and pt aware. Simulated car transfer in preparation for d/c from SUV height and car height S to mod I.  Therapy Documentation Precautions:  Precautions Precautions: Fall Precaution Comments: ventricular shunt  Restrictions Weight Bearing Restrictions: No   See FIM for current functional status  Therapy/Group: Individual Therapy  Karolee Stamps [facility] 07/27/2013, 9:29 AM

## 2013-07-28 ENCOUNTER — Inpatient Hospital Stay (HOSPITAL_COMMUNITY): Payer: Self-pay

## 2013-07-28 ENCOUNTER — Inpatient Hospital Stay (HOSPITAL_COMMUNITY): Payer: Self-pay | Admitting: Speech Pathology

## 2013-07-28 ENCOUNTER — Encounter (HOSPITAL_COMMUNITY): Payer: Self-pay

## 2013-07-28 DIAGNOSIS — I6350 Cerebral infarction due to unspecified occlusion or stenosis of unspecified cerebral artery: Secondary | ICD-10-CM

## 2013-07-28 LAB — CREATININE, SERUM
Creatinine, Ser: 1.1 mg/dL (ref 0.50–1.10)
GFR calc non Af Amer: 56 mL/min — ABNORMAL LOW (ref >90)

## 2013-07-28 NOTE — Progress Notes (Signed)
Occupational Therapy Discharge Summary  Patient Details  Name: Alison Ward MRN: [id number] Date of Birth: [birth year]  Today's Date: 07/28/2013  Patient has met 8 of 8 long term goals due to improved activity tolerance, improved balance, ability to compensate for deficits, improved attention and improved awareness.  Patient to discharge at overall Modified Independent level.  Patient's care partner is independent to provide the necessary assistance at discharge.    Reasons goals not met: N/A  Recommendation:  Patient will not require additional skilled OT services  Equipment: No equipment provided  Reasons for discharge: treatment goals met  Patient/family agrees with progress made and goals achieved: Yes  OT Discharge Precautions/Restrictions  Precautions Precautions: Fall Precaution Comments: ventricular shunt  Restrictions Weight Bearing Restrictions: No  Pain Pain Assessment Pain Assessment: No/denies pain  ADL ADL Eating: Independent Where Assessed-Eating: Edge of bed Grooming: Modified independent Where Assessed-Grooming: Standing at sink Upper Body Bathing: Modified independent Where Assessed-Upper Body Bathing: Shower Lower Body Bathing: Modified independent Where Assessed-Lower Body Bathing: Shower Upper Body Dressing: Modified independent (Device) Where Assessed-Upper Body Dressing: Chair Lower Body Dressing: Modified independent Where Assessed-Lower Body Dressing: Chair Toileting: Independent Where Assessed-Toileting: Teacher, adult education: Community education officer Method: Surveyor, minerals: Engineer, technical sales: Modified independent Web designer Method: Engineer, technical sales: Designer, multimedia: Modified independent Film/video editor Method: Warden/ranger: Transfer tub bench;Grab bars  Vision/Perception  Vision - History Baseline  Vision: Wears glasses all the time Patient Visual Report: Peripheral vision impairment Vision - Assessment Eye Alignment: Within Arts development officer Fields: Left homonymous hemianopsia Perception Perception: Within Functional Limits Inattention/Neglect: Other (comment) Praxis Praxis: Intact   Cognition Overall Cognitive Status: Within Functional Limits for tasks assessed Arousal/Alertness: Awake/alert Orientation Level: Oriented X4 Attention: Divided Sustained Attention: Appears intact Selective Attention: Appears intact Alternating Attention: Appears intact Alternating Attention Impairment: Verbal basic Divided Attention: Appears intact Memory: Impaired Memory Impairment: Decreased short term memory Decreased Short Term Memory: Functional complex Awareness: Appears intact Problem Solving: Appears intact Executive Function: Self Monitoring Self Monitoring: Appears intact Safety/Judgment: Appears intact Comments: Pt demonstrates very good awareness of impairments and integrates compensatory strategies well  Sensation Sensation Light Touch: Appears Intact Hot/Cold: Appears Intact Proprioception: Appears Intact Coordination Gross Motor Movements are Fluid and Coordinated: Yes Fine Motor Movements are Fluid and Coordinated: Yes  Motor  Motor Motor: Within Functional Limits  Mobility  Bed Mobility Bed Mobility: Supine to Sit;Sit to Supine Rolling Left: 7: Independent Supine to Sit: 7: Independent Sitting - Scoot to Edge of Bed: 7: Independent Sit to Supine: 7: Independent Scooting to HOB: 7: Independent Transfers Transfers: Sit to Stand;Stand to Sit Sit to Stand: 7: Independent Stand to Sit: 7: Independent   Trunk/Postural Assessment  Cervical Assessment Cervical Assessment: Within Functional Limits Thoracic Assessment Thoracic Assessment: Within Functional Limits Lumbar Assessment Lumbar Assessment: Within Functional Limits Postural Control Postural  Control: Within Functional Limits   Balance Balance Balance Assessed: Yes Standardized Balance Assessment Standardized Balance Assessment: Berg Balance Test (see PT note) Berg Balance Test Sit to Stand: Able to stand without using hands and stabilize independently Standing Unsupported: Able to stand safely 2 minutes Sitting with Back Unsupported but Feet Supported on Floor or Stool: Able to sit safely and securely 2 minutes Stand to Sit: Sits safely with minimal use of hands Transfers: Able to transfer safely, minor use of hands Standing Unsupported with Eyes Closed: Able to stand 10 seconds safely Standing Ubsupported  with Feet Together: Able to place feet together independently and stand 1 minute safely From Standing, Reach Forward with Outstretched Arm: Can reach confidently >25 cm (10") From Standing Position, Pick up Object from Floor: Able to pick up shoe safely and easily From Standing Position, Turn to Look Behind Over each Shoulder: Looks behind from both sides and weight shifts well Turn 360 Degrees: Able to turn 360 degrees safely in 4 seconds or less Standing Unsupported, Alternately Place Feet on Step/Stool: Able to stand independently and safely and complete 8 steps in 20 seconds Standing Unsupported, One Foot in Front: Able to place foot tandem independently and hold 30 seconds Standing on One Leg: Able to lift leg independently and hold 5-10 seconds Total Score: 55 Static Sitting Balance Static Sitting - Balance Support: No upper extremity supported;Feet unsupported Static Sitting - Level of Assistance: 7: Independent Dynamic Sitting Balance Dynamic Sitting - Balance Support: No upper extremity supported;Feet unsupported Dynamic Sitting - Level of Assistance: 7: Independent Dynamic Sitting - Balance Activities: Lateral lean/weight shifting;Forward lean/weight shifting;Reaching for objects;Reaching across midline Static Standing Balance Static Standing - Balance Support:  No upper extremity supported Static Standing - Level of Assistance: 7: Independent Dynamic Standing Balance Dynamic Standing - Balance Support: Left upper extremity supported;Right upper extremity supported;No upper extremity supported Dynamic Standing - Level of Assistance: 7: Independent Dynamic Standing - Balance Activities: Lateral lean/weight shifting;Forward lean/weight shifting;Reaching for objects  Extremity/Trunk Assessment RUE Assessment RUE Assessment: Within Functional Limits LUE Assessment LUE Assessment: Within Functional Limits  See FIM for current functional status  Georgeanne Nim 07/28/2013, 3:53 PM

## 2013-07-28 NOTE — Progress Notes (Signed)
Occupational Therapy Session Note  Patient Details  Name: Alison Ward MRN: [id number] Date of Birth: 1960-12-16  Today's Date: 07/28/2013 Time: 0932-1025 Time Calculation (min): 53 min  Short Term Goals: Week 1:  OT Short Term Goal 1 (Week 1): Pt will require supervision for dynamic standing during ADL tasks. OT Short Term Goal 2 (Week 1): Pt will implement visual scanning strategies to the L during functional ambulation with no more than min cues. OT Short Term Goal 3 (Week 1): Pt will complete shower transfer with supervision.  Skilled Therapeutic Interventions/Progress Updates: ADL-retraining with emphasis on family education, performance of dynamic sitting and standing activities (bathing, dressing, mobility), and incorporation of visual scanning strategies to compensate for left hemianopia.  Patient completed bathing and dressing, both seated and standing, with setup assist due to highly distracting environment (family members present).  Patient fully incorporates scanning strategies without need for cues.   Family educated on level of assistance required (supervision with verbal cues and setup, if requested).  No evidence of LOB or endurance deficits this session.   Patient was able to return to her room, using only wall signs and landmark architectural features for guidance, after being escorted to a new room, approx 300' from her room (To [place] wing from [place] wing) to participate in a moderately distracting activity with he granddaughter for 5 minutes.    Therapy Documentation Precautions:  Precautions Precautions: Fall Precaution Comments: ventricular shunt  Restrictions Weight Bearing Restrictions: No  Pain: Pain Assessment Pain Assessment: No/denies pain Pain Score: 0-No pain  See FIM for current functional status  Therapy/Group: Individual Therapy  Georgeanne Nim 07/28/2013, 10:48 AM

## 2013-07-28 NOTE — Progress Notes (Signed)
Physical Therapy Discharge Summary  Patient Details  Name: Alison Ward MRN: [id number] Date of Birth: [birth year]  Today's Date: 07/28/2013 Time: Treatment Session 1: 1191-4782; Treatment Session 2: 9562-1308 Time Calculation (min): Treatment Session 1: 40 min; Treatment Session 2:   Patient has met 11 of 11 long term goals due to improved activity tolerance, improved balance, increased strength, ability to compensate for deficits, improved attention, improved awareness and improved coordination.  Patient to discharge at an ambulatory level Modified Independent.   Patient's care partner is independent to provide the necessary physical and cognitive assistance at discharge.  Reasons goals not met: n/a, all LTGs met  Recommendation:  Patient will benefit from ongoing skilled PT services in outpatient setting to continue to advance safe functional mobility, address ongoing impairments in L inattention, higher level dynamic balance, and minimize fall risk.  Equipment: No equipment provided  Reasons for discharge: treatment goals met  Patient/family agrees with progress made and goals achieved: Yes  Skilled Therapeutic Interventions Treatment Session 1:  1:1. Pt received sitting in chair w/ family in room. Focus this tx session on family training w/ pt's mother and daughter. Pt's family educated on as well as demonstrated appropriate level of supervision and verbal cues for pt during ambulation on/off unit in community environment, pathfinding on/off unit, stair negotiation and car t/f. Pt/family educated at length regarding pt's remaining impairments, d/c process as well as benefits of f/u OP PT. Pt's family verbalized understanding of all information taught this tx session. Overall, pt able to amb >800' this tx session w/ intermittent min vc to attend to L side of environment when in community setting w/out bumping into anything on L side and good recall of pathfinding w/ min use of  signs. Pt sitting in chair at end of tx session w/ all needs in reach and family in room.     Treatment Session 2:  1:1. Pt received sitting in chair w/ family in room. Focus this tx session on floor transfers, reassessment of dynamic standing balance as well as pathfinding on/off unit. Berg Balance Test performed and pt scored 55/56, demonstrating a significant improvement regarding pt's balance from score of 44/56 received at time of eval. Pt and family educated on results. Pt able to demonstrate successful floor transfer w/ supervision and min vc for safety. At end of tx session, pt was provided a list of 6 places on/off the unit that she needed to find. Pt demonstrated good ability to locate all 6 items on list w/ use of memory, signs in hospital and min-mod vc. Pt also able to help lead a lost visitor to the cafeteria. Overall, pt able to amb >800' this tx session w/ supervision off unit and mod(I) on unit. Pt sitting in chair at end of tx session w/ all needs in reach and family in room.   PT Discharge Precautions/Restrictions Precautions Precautions: Fall Restrictions Weight Bearing Restrictions: No Vital Signs Therapy Vitals Temp: 97.3 F (36.3 C) Temp src: Oral Pulse Rate: 76 Resp: 20 BP: 126/81 mmHg Patient Position, if appropriate: Sitting Oxygen Therapy SpO2: 100 % O2 Device: None (Room air) Pain Pain Assessment Pain Assessment: No/denies pain Vision/Perception  Vision - History Baseline Vision: Wears glasses all the time Patient Visual Report: Other (comment) (mild L inattention) Perception Perception: Within Functional Limits Inattention/Neglect: Other (comment) (mild L inattention) Praxis Praxis: Intact  Cognition Overall Cognitive Status: Within Functional Limits for tasks assessed Arousal/Alertness: Awake/alert Orientation Level: Oriented X4 Attention: Divided;Alternating Sustained Attention: Appears intact  Selective Attention: Appears intact Alternating  Attention: Appears intact Alternating Attention Impairment: Verbal basic Divided Attention: Appears intact Memory: Impaired Memory Impairment: Decreased short term memory Decreased Short Term Memory: Functional complex Awareness: Appears intact Problem Solving: Appears intact Executive Function: Self Monitoring Self Monitoring: Appears intact Safety/Judgment: Appears intact Comments: Pt demonstrates very good awareness of impairments and integrates compensatory strategies well Sensation Sensation Light Touch: Appears Intact Proprioception: Appears Intact Coordination Gross Motor Movements are Fluid and Coordinated: Yes Motor  Motor Motor: Within Functional Limits  Mobility Bed Mobility Bed Mobility: Supine to Sit;Sit to Supine Supine to Sit: 7: Independent Sit to Supine: 7: Independent Transfers Transfers: Yes Sit to Stand: 6: Mod Independent Stand to Sit: 6: Mod Independent Stand Pivot Transfers: 6: Mod Independent  Locomotion  Ambulation Ambulation: Yes Ambulation/Gait Assistance: 5: Supervision Ambulation Distance (Feet): 800 Feet Assistive device: None Ambulation/Gait Assistance Details: inconsistent min vc to attend to L side of environment when in community enviornment Gait Gait: Yes Gait Pattern: Within Functional Limits Stairs / Additional Locomotion Stairs: Yes Stairs Assistance: 5: Supervision Stairs Assistance Details: Verbal cues for precautions/safety Stair Management Technique: One rail Left;No rails Number of Stairs: 10 Wheelchair Mobility Wheelchair Mobility: No (Ambulation is pt's primary means of mobility)  Trunk/Postural Assessment  Postural Control Postural Control: Within Functional Limits  Balance Balance Balance Assessed: Yes Standardized Balance Assessment Standardized Balance Assessment: Berg Balance Test Berg Balance Test Sit to Stand: Able to stand without using hands and stabilize independently Standing Unsupported: Able to stand  safely 2 minutes Sitting with Back Unsupported but Feet Supported on Floor or Stool: Able to sit safely and securely 2 minutes Stand to Sit: Sits safely with minimal use of hands Transfers: Able to transfer safely, minor use of hands Standing Unsupported with Eyes Closed: Able to stand 10 seconds safely Standing Ubsupported with Feet Together: Able to place feet together independently and stand 1 minute safely From Standing, Reach Forward with Outstretched Arm: Can reach confidently >25 cm (10") From Standing Position, Pick up Object from Floor: Able to pick up shoe safely and easily From Standing Position, Turn to Look Behind Over each Shoulder: Looks behind from both sides and weight shifts well Turn 360 Degrees: Able to turn 360 degrees safely in 4 seconds or less Standing Unsupported, Alternately Place Feet on Step/Stool: Able to stand independently and safely and complete 8 steps in 20 seconds Standing Unsupported, One Foot in Front: Able to place foot tandem independently and hold 30 seconds Standing on One Leg: Able to lift leg independently and hold 5-10 seconds Total Score: 55 Static Sitting Balance Static Sitting - Balance Support: No upper extremity supported;Feet unsupported Static Sitting - Level of Assistance: 7: Independent Dynamic Sitting Balance Dynamic Sitting - Balance Support: No upper extremity supported;Feet unsupported Dynamic Sitting - Level of Assistance: 7: Independent Dynamic Sitting - Balance Activities: Lateral lean/weight shifting;Forward lean/weight shifting;Reaching for objects;Reaching across midline Static Standing Balance Static Standing - Balance Support: No upper extremity supported Static Standing - Level of Assistance: 7: Independent Dynamic Standing Balance Dynamic Standing - Balance Support: Left upper extremity supported;Right upper extremity supported;No upper extremity supported Dynamic Standing - Level of Assistance: 6: Modified independent  (Device/Increase time) Dynamic Standing - Balance Activities: Lateral lean/weight shifting;Forward lean/weight shifting;Reaching for objects Extremity Assessment  RLE Assessment RLE Assessment: Within Functional Limits (Grossly 4 to 4+/5) LLE Assessment LLE Assessment: Within Functional Limits (Grossly 4 to 4+/5)  See FIM for current functional status  Denzil Hughes 07/28/2013, 4:04 PM

## 2013-07-28 NOTE — Discharge Summary (Signed)
  Discharge summary job # [id number]

## 2013-07-28 NOTE — Progress Notes (Signed)
Speech Language Pathology Daily Session Note  Patient Details  Name: Alison Ward MRN: [id number] Date of Birth: 1961-07-09  Today's Date: 07/28/2013 Time: 0900-0930 Time Calculation (min): 30 min  Short Term Goals: Week 1: SLP Short Term Goal 1 (Week 1): Pt will alternate attention between functional tasks for 30 min with Min cues. SLP Short Term Goal 2 (Week 1): Pt will demonstrate emergent awareness of impairments with Min cues. SLP Short Term Goal 3 (Week 1): Pt will demonstrate basic problem solving during functional task with Min cues. SLP Short Term Goal 4 (Week 1): Pt will attend to her left visual field with Min cues.  Skilled Therapeutic Interventions: Skilled treatment session focused on family education with pt and her cousin in regards to current cognitive function and strategies to utilize at home to increase working memory, organization and complex problem solving. Pt also independently generated a list of tasks she can do at home to increase her overall functional independence and activity level. Both the pt and her cousin verbalized understanding of all information, however, family education needs to be completed with the pt's daughter and mother.    FIM:  Comprehension Comprehension Mode: Auditory Comprehension: 5-Understands complex 90% of the time/Cues < 10% of the time Expression Expression Mode: Verbal Expression: 6-Expresses complex ideas: With extra time/assistive device Social Interaction Social Interaction: 6-Interacts appropriately with others with medication or extra time (anti-anxiety, antidepressant). Problem Solving Problem Solving: 5-Solves basic 90% of the time/requires cueing < 10% of the time Memory Memory: 5-Recognizes or recalls 90% of the time/requires cueing < 10% of the time  Pain Pain Assessment Pain Assessment: No/denies pain  Therapy/Group: Individual Therapy  Eily Louvier 07/28/2013, 4:36 PM

## 2013-07-28 NOTE — Progress Notes (Signed)
Subjective/Complaints: Feeling well. MOD I in the room. No complaints A 12 point review of systems has been performed and if not noted above is otherwise negative.   Objective: Vital Signs: Blood pressure 126/81, pulse 64, temperature 98.2 F (36.8 C), temperature source Oral, resp. rate 18, height 5\' 8"  (1.727 m), weight 110.1 kg (242 lb 11.6 oz), last menstrual period 11/24/2011, SpO2 98.00%. No results found. No results found for this basename: WBC, HGB, HCT, PLT,  in the last 72 hours No results found for this basename: NA, K, CL, CO, GLUCOSE, BUN, CREATININE, CALCIUM,  in the last 72 hours CBG (last 3)  No results found for this basename: GLUCAP,  in the last 72 hours  Wt Readings from Last 3 Encounters:  07/23/13 110.1 kg (242 lb 11.6 oz)  07/21/13 109.3 kg (240 lb 15.4 oz)  07/21/13 109.3 kg (240 lb 15.4 oz)    Physical Exam:  Constitutional: She is oriented to person, place, and time. She appears well-developed. Right leg hanging off the bed Eyes: EOM are normal.  Neck: Normal range of motion. Neck supple. No thyromegaly present.  Cardiovascular: Normal rate and regular rhythm.  Pulmonary/Chest: Effort normal and breath sounds normal. No respiratory distress.  Abdominal: Soft. Bowel sounds are normal. She exhibits no distension.  Neurological: She is alert and oriented to person, place, and time.  Intact insight and awareness. Memory intact.  Left inattention better. Can see farther past midline to the left (9:30) Left UE grossly 4+/5. LLE is 4+/5. Intact touch to pain and general touch. Left central 7 is minimal.  Musc: mild pain in TFL's with cross legged maneuvers Skin: Drain site clean and dry  Psychiatric: She has a normal mood and affect. Her behavior is normal   Assessment/Plan: 1. Functional deficits secondary to right Temporo-parietal ICH which require 3+ hours per day of interdisciplinary therapy in a comprehensive inpatient rehab setting. Physiatrist is  providing close team supervision and 24 hour management of active medical problems listed below. Physiatrist and rehab team continue to assess barriers to discharge/monitor patient progress toward functional and medical goals.  Home tomorrow. Family ed today    FIM: FIM - Bathing Bathing Steps Patient Completed: Right Arm;Chest;Left Arm;Abdomen;Front perineal area;Buttocks;Right upper leg;Left upper leg;Right lower leg (including foot);Left lower leg (including foot) Bathing: 5: Supervision: Safety issues/verbal cues  FIM - Upper Body Dressing/Undressing Upper body dressing/undressing steps patient completed: Thread/unthread right bra strap;Thread/unthread left bra strap;Thread/unthread right sleeve of pullover shirt/dresss;Thread/unthread left sleeve of pullover shirt/dress;Put head through opening of pull over shirt/dress;Pull shirt over trunk Upper body dressing/undressing: 4: Min-Patient completed 75 plus % of tasks FIM - Lower Body Dressing/Undressing Lower body dressing/undressing steps patient completed: Thread/unthread right underwear leg;Thread/unthread left underwear leg;Pull underwear up/down;Thread/unthread right pants leg;Thread/unthread left pants leg;Pull pants up/down;Fasten/unfasten pants;Don/Doff right shoe;Don/Doff left shoe;Fasten/unfasten right shoe;Fasten/unfasten left shoe Lower body dressing/undressing: 7: Complete Independence: No helper  FIM - Toileting Toileting steps completed by patient: Adjust clothing prior to toileting;Performs perineal hygiene;Adjust clothing after toileting Toileting Assistive Devices: Grab bar or rail for support Toileting: 5: Supervision: Safety issues/verbal cues  FIM - Diplomatic Services operational officer Devices: Grab bars Toilet Transfers: 5-To toilet/BSC: Supervision (verbal cues/safety issues);5-From toilet/BSC: Supervision (verbal cues/safety issues)  FIM - Banker Devices: Bed  rails Bed/Chair Transfer: 6: More than reasonable amt of time  FIM - Locomotion: Wheelchair Distance: 50-75' x 5; Pt req consistent v/c to attend to L side of environment.  Locomotion: Wheelchair: 0: Activity  did not occur FIM - Locomotion: Ambulation Locomotion: Ambulation Assistive Devices: Other (comment) (none) Ambulation/Gait Assistance: 5: Supervision Locomotion: Ambulation: 5: Travels 150 ft or more with supervision/safety issues  Comprehension Comprehension Mode: Auditory Comprehension: 5-Understands complex 90% of the time/Cues < 10% of the time  Expression Expression Mode: Verbal Expression: 5-Expresses complex 90% of the time/cues < 10% of the time  Social Interaction Social Interaction: 6-Interacts appropriately with others with medication or extra time (anti-anxiety, antidepressant).  Problem Solving Problem Solving: 5-Solves basic 90% of the time/requires cueing < 10% of the time  Memory Memory: 5-Recognizes or recalls 90% of the time/requires cueing < 10% of the time  Medical Problem List and Plan:  1. 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 DVT Prophylaxis/Anticoagulation: can dc lovenox as patient is ambulating regularly with therapy and moving legs well. 3. Pain Management: Tylenol as needed. Apply heat prn to thighs 4. Neuropsych: This patient is capable of making decisions on her own behalf.  5. Seizure prophylaxis. Depakote ER 1000 mg daily. Continue for now  6. Hypertension. Hydrochlorothiazide 12.5 mg daily, lisinopril 20 mg daily. Monitor with increased mobility  7. History of medical noncompliance. Counseling  LOS (Days) 7 A FACE TO FACE EVALUATION WAS PERFORMED  SWARTZ,ZACHARY T 07/28/2013 7:30 AM

## 2013-07-29 ENCOUNTER — Encounter (HOSPITAL_COMMUNITY): Payer: Self-pay | Admitting: Speech Pathology

## 2013-07-29 DIAGNOSIS — I619 Nontraumatic intracerebral hemorrhage, unspecified: Secondary | ICD-10-CM

## 2013-07-29 DIAGNOSIS — I10 Essential (primary) hypertension: Secondary | ICD-10-CM

## 2013-07-29 MED ORDER — ATORVASTATIN CALCIUM 40 MG PO TABS: 40.0000 mg | ORAL_TABLET | Freq: Every day | ORAL | Status: DC

## 2013-07-29 MED ORDER — CALCIUM CARBONATE ANTACID 500 MG PO CHEW: 1.0000 | CHEWABLE_TABLET | Freq: Two times a day (BID) | ORAL | Status: DC

## 2013-07-29 MED ORDER — POTASSIUM CHLORIDE CRYS ER 20 MEQ PO TBCR: 20.0000 meq | EXTENDED_RELEASE_TABLET | Freq: Every day | ORAL | Status: DC

## 2013-07-29 MED ORDER — DIVALPROEX SODIUM ER 500 MG PO TB24: 1000.0000 mg | ORAL_TABLET | Freq: Every day | ORAL | Status: DC

## 2013-07-29 MED ORDER — LISINOPRIL 20 MG PO TABS: 20.0000 mg | ORAL_TABLET | Freq: Every day | ORAL | Status: DC

## 2013-07-29 MED ORDER — PANTOPRAZOLE SODIUM 40 MG PO TBEC: 40.0000 mg | DELAYED_RELEASE_TABLET | Freq: Every day | ORAL | Status: DC

## 2013-07-29 MED ORDER — HYDROCHLOROTHIAZIDE 12.5 MG PO CAPS: 12.5000 mg | ORAL_CAPSULE | Freq: Every day | ORAL | Status: DC

## 2013-07-29 NOTE — Discharge Summary (Signed)
NAMEMarland Kitchen  CHANDRA, ASHER NO.:  [id number]  MEDICAL RECORD NO.:  [id number]  LOCATION:  4M02C                        FACILITY:  [facility]  PHYSICIAN:  Ranelle Oyster, M.D.DATE OF BIRTH:  [birth year]  DATE OF ADMISSION:  07/21/2013 DATE OF DISCHARGE:  07/29/2013                              DISCHARGE SUMMARY   DISCHARGE DIAGNOSES: 1. Right temporoparietal intracerebral hemorrhage, status post burr     hole ventriculostomy. 2. Subcutaneous Lovenox for deep venous thrombosis prophylaxis.     Discontinued as the patient was ambulatory. 3. Seizure prophylaxis. 4. Hypertension. 5. History of medical noncompliance.  HISTORY OF PRESENT ILLNESS:  This is a 52 year old right-handed African American female with history of hypertension and poor medical compliance, who was admitted on July 06, 2013, with headache as well as lethargy.  Blood pressure 202/98 on admission to the emergency room. CT of the head showed a 4.1 x 3.1-cm parietotemporal intracerebral infarction with surrounding edema and intraventricular extension of hemorrhage as well as 6-mm right-to-left midline shift and hydrocephalus.  Underwent placement of right ventriculostomy by burr hole on July 06, 2013, per Dr. Lovell Sheehan.  Carotid Dopplers with no ICA stenosis.  Echocardiogram with ejection fraction of 70%, grade 1 diastolic dysfunction.  The patient did not receive tPA secondary to ICH.  Placed on Cardene drip for blood pressure control.  Followup Neurosurgery with findings of occluded ventriculostomy vessel, underwent placement of left ventriculostomy and removal of right ventriculostomy on July 07, 2013.  On July 18, 2013, ventriculostomy was clamped and removed without difficulty.  Latest followup cranial CT scan with no new hemorrhage.  Maintained on Depakote for seizure prophylaxis. Subcutaneous Lovenox initiated on July 14, 2013, for DVT prophylaxis.  The patient maintained on a  regular consistency diet. Physical and Occupational Therapy ongoing, the patient was admitted for comprehensive rehab program.  PAST MEDICAL HISTORY:  See discharge diagnoses.  SOCIAL HISTORY:  Lives alone with supportive family.  FUNCTIONAL HISTORY PRIOR TO ADMISSION:  Independent.  FUNCTIONAL STATUS UPON ADMISSION TO REHAB SERVICES:  +2 total assist for sit to supine.  PHYSICAL EXAMINATION:  VITAL SIGNS:  Blood pressure 139/86, pulse 76, temperature 98.5, respirations 20. GENERAL:  This was an alert female, oriented to person, place, and time. HEENT:  Pupils were round and reactive to light without nystagmus. LUNGS:  Clear to auscultation. CARDIAC:  Regular rate and rhythm. ABDOMEN:  Soft, nontender.  Good bowel sounds. NEUROLOGIC:  She follows simple commands.  She had a mild left inattention and field cut.  REHABILITATION HOSPITAL COURSE:  The patient was admitted to Inpatient Rehab Services with therapies initiated on a 3-hour daily basis consisting of physical therapy, occupational therapy, speech therapy, and rehabilitation nursing.  The following issues were addressed during the patient's rehabilitation stay.  Pertaining to Ms. Lippens's right ICH remained stable, she would follow Neurosurgery, Dr. Tressie Stalker. She had been on subcutaneous Lovenox for DVT prophylaxis.  This was discontinued as the patient is now ambulatory.  She would continue on Depakote for history of seizure prophylaxis with no seizure activity noted.  Blood pressures remained well controlled on hydrochlorothiazide as well as lisinopril.  She would follow with her primary care provider. She  did have a history of medical noncompliance, received full counseling with the patient and family in regards to maintaining medical regimen.  The patient received weekly collaborative interdisciplinary team conferences to discuss estimated length of stay, family teaching, and any barriers to discharge.  She was  continent of bowel and bladder. She was on a regular consistency diet.  She was now modified independent in her room.  Full family teaching was completed.  It was advised the need for supervision due to some left-sided inattention.  She was able to verbalize understanding of her therapy sessions.  Ongoing therapies would be dictated as per [employer].  DISCHARGE MEDICATIONS: 1. Lipitor 40 mg p.o. daily. 2. Tums p.o. b.i.d. 3. Depakote Extended Release 1000 mg p.o. daily. 4. Hydrochlorothiazide 12.5 mg p.o. daily. 5. Lisinopril 20 mg p.o. daily. 6. Protonix 40 mg p.o. daily.  DIET:  Regular.  SPECIAL INSTRUCTIONS:  No driving.  Continue medical regimen as advised. Ongoing therapies dictated as per [employer].  The patient would follow up with Dr. Tressie Stalker, neurosurgery, 2 weeks, call for appointment.  Dr. Faith Rogue, at the outpatient rehab service office as directed.  Case management arranging follow up with PCP.     Mariam Dollar, P.A.   ______________________________ Ranelle Oyster, M.D.    DA/MEDQ  D:  07/28/2013  T:  07/29/2013  Job:  [id number]  cc:   Cristi Loron, M.D.

## 2013-07-29 NOTE — Consult Note (Signed)
NEUROCOGNITIVE TESTING - [facility]   Mrs. Erienne Spelman was referred for neuropsychological consultation given the possibility of cognitive sequelae subsequent to the current medical status and in order to assist in treatment planning.   Records indicated that Mrs. Hippert was admitted to the rehab unit owing to functional deficits secondary to right temporal-parietal intracerebral hemorrhage. During today's visit, the patient endorsed suffering from increase memory loss, poor attention-related skills, and slowed thinking. She described her mood as "pretty good" but also admitted to increased worry about the future and the uncertainty that it holds in regards to whether she will have another stroke or fully recovery from this one. She has a good social support system and she was accompanied today by her mother, daughter, and grandchild. She also feels that she is making gains in therapy.   PROCEDURES: [2 units of 56213 on 07/28/13]  Diagnostic Interview Medical record review Behavioral observations  The following tests were performed during today's visit: Repeatable Battery for the Assessment of Neuropsychological Status (RBANS, form A).   Test results are as follows:   RBANS Indices Scaled Score Percentile Description  Immediate Memory  106 66 Average  Visuospatial/Constructional 78 7 Borderline impaired  Language 99 47 Average  Attention 82 12 Below average  Delayed Memory 81 10 Below average  Total Score 85 16 Below average    RBANS Subtests Raw Score Percentile Description  List Learning 29 61 Average  Story Memory 18 55 Average  Figure Copy 17 19 Below average  Line Orientation 11 3 Impaired  Picture Naming 9 32 Average  Semantic Fluency 22 58 Average  Digit Span 10 39 Average  Coding 28 2 Markedly impaired  List Recall 5 32 Average  List Recognition 18 7 Borderline  Story Recall 8 32 Average  Figure recall 11 21 Below average    Cognitive Evaluation: Test results revealed generally intact functioning with the exception of reduced constructional praxis and impaired spatial judgment and processing speed.   Emotional & Behavioral Evaluation: Mrs. Caton was appropriately dressed for season and situation, and she appeared tidy and well-groomed. Normal gait and posture were noted. She was friendly and rapport easily established. Her speech was as expected and she was able to express ideas effectively. She seemed to understand test directions readily. Her affect was appropriately modulated. Attention and motivation were good. Optimal test taking conditions were maintained.  From an emotional standpoint, Mrs. Wickstrom endorsed experiencing a mild degree of anxiety and worry involving her present medical situation, but there were no major indications of clinical psychopathology. Suicidal/homicidal ideation, plan or intent was denied. No manic or hypomanic episodes were reported. The patient denied ever experiencing any auditory/visual hallucinations. No major behavioral or personality changes were endorsed.    Overall, Mrs. Blaydes appears to be experiencing a mild degree of cognitive deficit subsequent to the stroke. These difficulties are generally circumscribed and include visuospatial issues and slowed thinking. These would be consistent with the location in the brain in which the stroke occurred. Overall, it is my hope that with time that her inefficiencies will return to normal or close to baseline.   In light of these findings, the following recommendations are provided.    RECOMMENDATIONS  Recommendations for treatment team:    To the extent possible, multitasking should be avoided.   Mrs. Lawrie requires much more time than typical to process information. The treatment team may benefit from waiting for a verbal response to information before presenting additional information.  Be aware that she is suffering from  significant visual spatial deficits of which she is not aware. Taking this into consideration will help tailor therapy and keep accidents at bay as much as possible when she is trying to navigate her surroundings.    Performance will generally be best in a structured, routine, and familiar environment, as opposed to situations involving complex problems.   Recommendations for discharge planning:    Complete a comprehensive neuropsychological evaluation as an outpatient in 8-12 months to assess for interval change. This can be done through Orie Fisherman, PsyD by calling the following number: [phone].    Establish long-term follow-up care with a provider knowledgeable in stroke.    Maintain engagement in mentally, physically and cognitively stimulating activities.    Strive to maintain a healthy lifestyle (e.g., proper diet and exercise) in order to promote physical, cognitive and emotional health.    The patient should refrain from driving at this time.    FINAL DIAGNOSES:  Acute hemorrhagic infarction of the brain Adjustment reaction with anxiety (mild)    Debbe Mounts, Psy.D.  Clinical Neuropsychologist

## 2013-07-29 NOTE — Progress Notes (Signed)
Patient discharge to home with family at 24.  Discharge instruction provided by Harvel Ricks, PA.  Patient verbalized understanding, no further question ask.  Patient escorted off unit by rehab NT.

## 2013-07-29 NOTE — Progress Notes (Signed)
Speech Language Pathology Session Note & Discharge Summary  Patient Details  Name: Alison Ward MRN: [id number] Date of Birth: [birth year]  Today's Date: 07/29/2013 Time: 1015-1030 Time Calculation (min): 15 min  Skilled Therapeutic Intervention: Treatment focus on completing family education with pt's mother and daughter in regards to pt's current cognitive function and strategies to utilize at home to increase working memory, problem solving and organization with functional tasks. All verbalized and demonstrated understanding and handouts were given to reinforce information.   Patient has met 4 of 4 long term goals.  Patient to discharge at overall Supervision level.   Reasons goals not met: N/A   Clinical Impression/Discharge Summary: Pt has made excellent gains and has met 4 of 4 LTG's this admission. Currently, pt requires supervision level cueing for divided attention, working memory with utilization of compensatory strategies, emergent awareness, functional problem solving and organization with mildly complex tasks. Pt/family education complete and pt will discharge home with 24 hour supervision.  Pt would benefit from f/u outpatient SLP intervention to maximize cognitive function and overall functional independence.   Care Partner:  Caregiver Able to Provide Assistance: Yes  Type of Caregiver Assistance: Physical;Cognitive  Recommendation:  Outpatient SLP;24 hour supervision/assistance  Rationale for SLP Follow Up: Maximize cognitive function and independence   Equipment: N/A   Reasons for discharge: Treatment goals met;Discharged from hospital   Patient/Family Agrees with Progress Made and Goals Achieved: Yes   See FIM for current functional status  Alison Ward 07/29/2013, 11:06 AM

## 2013-07-29 NOTE — Progress Notes (Signed)
Subjective/Complaints: Slept well. Family ed went well. No new problems.  A 12 point review of systems has been performed and if not noted above is otherwise negative.   Objective: Vital Signs: Blood pressure 115/66, pulse 55, temperature 98.4 F (36.9 C), temperature source Oral, resp. rate 18, height 5\' 8"  (1.727 m), weight 110.1 kg (242 lb 11.6 oz), last menstrual period 11/24/2011, SpO2 100.00%. No results found. No results found for this basename: WBC, HGB, HCT, PLT,  in the last 72 hours  Recent Labs  07/28/13 0535  CREATININE 1.10   CBG (last 3)  No results found for this basename: GLUCAP,  in the last 72 hours  Wt Readings from Last 3 Encounters:  07/23/13 110.1 kg (242 lb 11.6 oz)  07/21/13 109.3 kg (240 lb 15.4 oz)  07/21/13 109.3 kg (240 lb 15.4 oz)    Physical Exam:  Constitutional: She is oriented to person, place, and time. She appears well-developed. Right leg hanging off the bed Eyes: EOM are normal.  Neck: Normal range of motion. Neck supple. No thyromegaly present.  Cardiovascular: Normal rate and regular rhythm.  Pulmonary/Chest: Effort normal and breath sounds normal. No respiratory distress.  Abdominal: Soft. Bowel sounds are normal. She exhibits no distension.  Neurological: She is alert and oriented to person, place, and time.  Intact insight and awareness. Memory intact.  Left inattention better. Can see farther past midline to the left (9:30) Left UE grossly 4+/5. LLE is 4+/5. Intact touch to pain and general touch. Left central 7 is minimal.  Musc: minimal pain today Skin: Drain site clean and dry  Psychiatric: She has a normal mood and affect. Her behavior is normal   Assessment/Plan: 1. Functional deficits secondary to right Temporo-parietal ICH which require 3+ hours per day of interdisciplinary therapy in a comprehensive inpatient rehab setting. Physiatrist is providing close team supervision and 24 hour management of active medical problems  listed below. Physiatrist and rehab team continue to assess barriers to discharge/monitor patient progress toward functional and medical goals.  Home today. We discussed home activities, return to work etc. i'll see her back in about a month in my office.    FIM: FIM - Bathing Bathing Steps Patient Completed: Chest;Right Arm;Left Arm;Abdomen;Front perineal area;Buttocks;Right upper leg;Left upper leg;Right lower leg (including foot);Left lower leg (including foot) Bathing: 7: Complete Independence: No helper  FIM - Upper Body Dressing/Undressing Upper body dressing/undressing steps patient completed: Thread/unthread right bra strap;Thread/unthread left bra strap;Hook/unhook bra;Thread/unthread right sleeve of pullover shirt/dresss;Thread/unthread left sleeve of pullover shirt/dress;Put head through opening of pull over shirt/dress;Pull shirt over trunk Upper body dressing/undressing: 7: Complete Independence: No helper FIM - Lower Body Dressing/Undressing Lower body dressing/undressing steps patient completed: Thread/unthread right underwear leg;Thread/unthread left underwear leg;Pull underwear up/down;Thread/unthread right pants leg;Thread/unthread left pants leg;Fasten/unfasten pants;Pull pants up/down;Don/Doff right sock;Don/Doff left sock;Don/Doff right shoe;Don/Doff left shoe;Fasten/unfasten right shoe;Fasten/unfasten left shoe Lower body dressing/undressing: 7: Complete Independence: No helper  FIM - Toileting Toileting steps completed by patient: Adjust clothing prior to toileting;Performs perineal hygiene;Adjust clothing after toileting Toileting Assistive Devices: Grab bar or rail for support Toileting: 7: Independent: No helper, no device  FIM - Diplomatic Services operational officer Devices: Therapist, music Transfers: 7-Independent: No helper  FIM - Banker Devices: Arm rests Bed/Chair Transfer: 7: Independent: No helper  FIM -  Locomotion: Wheelchair Distance: 50-75' x 5; Pt req consistent v/c to attend to L side of environment.  Locomotion: Wheelchair: 0: Activity did not occur (Ambulation is  pt's primary means of mobility) FIM - Locomotion: Ambulation Locomotion: Ambulation Assistive Devices: Other (comment) (none) Ambulation/Gait Assistance: 6: Modified independent (Device/Increase time) Locomotion: Ambulation: 5: Travels 150 ft or more with supervision/safety issues  Comprehension Comprehension Mode: Auditory Comprehension: 6-Follows complex conversation/direction: With extra time/assistive device  Expression Expression Mode: Verbal Expression: 6-Expresses complex ideas: With extra time/assistive device  Social Interaction Social Interaction: 6-Interacts appropriately with others with medication or extra time (anti-anxiety, antidepressant).  Problem Solving Problem Solving: 6-Solves complex problems: With extra time  Memory Memory: 6-More than reasonable amt of time  Medical Problem List and Plan:  1. 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 DVT Prophylaxis/Anticoagulation: can dc lovenox as patient is ambulating regularly with therapy and moving legs well. 3. Pain Management: Tylenol as needed. Apply heat prn to thighs 4. Neuropsych: This patient is capable of making decisions on her own behalf.  5. Seizure prophylaxis. Depakote ER 1000 mg daily. Continue for now  6. Hypertension. Hydrochlorothiazide 12.5 mg daily, lisinopril 20 mg daily. Monitor with increased mobility  7. History of medical noncompliance. Counseling  LOS (Days) 8 A FACE TO FACE EVALUATION WAS PERFORMED  SWARTZ,ZACHARY T 07/29/2013 7:03 AM

## 2013-07-29 NOTE — Discharge Planning (Signed)
[facility] Alison Ward- [employer]  A follow up appointment was made at the [facility] and Wellness clinic for Wednesday Oct 15,2014 at 11:00am. Patient will be establishing primary care and obtaining the orange card. Patient was given the application for the orange card and my contact information for any questions or concerns.

## 2013-07-30 ENCOUNTER — Ambulatory Visit: Payer: No Typology Code available for payment source | Admitting: Rehabilitative and Restorative Service Providers"

## 2013-07-30 ENCOUNTER — Ambulatory Visit: Payer: No Typology Code available for payment source | Attending: Physical Medicine & Rehabilitation

## 2013-07-30 DIAGNOSIS — R269 Unspecified abnormalities of gait and mobility: Secondary | ICD-10-CM | POA: Insufficient documentation

## 2013-07-30 DIAGNOSIS — IMO0001 Reserved for inherently not codable concepts without codable children: Secondary | ICD-10-CM | POA: Insufficient documentation

## 2013-07-30 NOTE — Progress Notes (Signed)
Social Work  Discharge Note  The overall goal for the admission was met for:   Discharge location: Yes - home with mother and family providing supervision  Length of Stay: Yes - 8 days  Discharge activity level: Yes - mod independent  Home/community participation: Yes  Services provided included: MD, RD, PT, OT, SLP, RN, Pharmacy, Neuropsych and SW  Financial Services: Other: self pay  Follow-up services arranged: Outpatient: PT @ [facility], Other: Referral also to [facility] and "[employer]" program.  Assisted with medications via "[facility]" program and Patient/Family has no preference for HH/DME agencies  Comments (or additional information):  Patient/Family verbalized understanding of follow-up arrangements: Yes  Individual responsible for coordination of the follow-up plan: patient  Confirmed correct DME delivered: NA no needs    Deseree Zemaitis

## 2013-07-31 ENCOUNTER — Ambulatory Visit: Payer: No Typology Code available for payment source | Admitting: Physical Therapy

## 2013-07-31 ENCOUNTER — Ambulatory Visit: Payer: No Typology Code available for payment source | Admitting: Speech Pathology

## 2013-08-13 ENCOUNTER — Ambulatory Visit: Payer: No Typology Code available for payment source | Attending: Internal Medicine | Admitting: Internal Medicine

## 2013-08-13 ENCOUNTER — Encounter: Payer: Self-pay | Admitting: Internal Medicine

## 2013-08-13 ENCOUNTER — Ambulatory Visit: Payer: No Typology Code available for payment source | Attending: Internal Medicine

## 2013-08-13 VITALS — BP 154/89 | HR 69 | Temp 98.0°F | Resp 16 | Ht 68.0 in | Wt 248.0 lb

## 2013-08-13 DIAGNOSIS — E669 Obesity, unspecified: Secondary | ICD-10-CM

## 2013-08-13 DIAGNOSIS — R7309 Other abnormal glucose: Secondary | ICD-10-CM | POA: Insufficient documentation

## 2013-08-13 MED ORDER — HYDROCHLOROTHIAZIDE 12.5 MG PO CAPS: 25.0000 mg | ORAL_CAPSULE | Freq: Every day | ORAL | Status: DC

## 2013-08-13 MED ORDER — THERA VITAL M PO TABS: 1.0000 | ORAL_TABLET | Freq: Every day | ORAL | Status: DC

## 2013-08-13 MED ORDER — LISINOPRIL 20 MG PO TABS: 20.0000 mg | ORAL_TABLET | Freq: Every day | ORAL | Status: DC

## 2013-08-13 NOTE — Progress Notes (Signed)
HFU  Pt had a stroke September 6.  Today pt reports that she is doing great. The only complaint is that her field of vision is not as it was. She is seeing a orange dot out of her right eye.

## 2013-08-13 NOTE — Progress Notes (Signed)
Patient ID: Alison Ward, female   DOB: [birth year], 52 y.o.   MRN: [id number] PCP:  Jeanann Lewandowsky, MD   DOA:  No admission date for patient encounter.  Chief Complaint:  Post hospitalization followup  HPI: 52 year old obese female who was admitted on 5 weeks back to [facility] for intracranial hemorrhage likely in the setting of uncontrolled hypertension and underwent right frontal ventriculostomy placement via burr hole and then removed the next day by neurosurgery. Patient improved clinically and was discharged to inpatient rehabilitation. She was discharged from inpatient rehabilitation and he is here for a post discharge follow up and establish care. She reports a minimal weakness on her left side but now has started to exercise regularly. He was started on antihypertensives, Lipitor and Depakote for seizure prophylaxis . she has followup with outpatient rehabilitation and neurosurgery later this month. She denies any headache, blurred vision, dizziness, nausea, vomiting, chest pain, palpitations, shortness of breath, abdominal pain, bowel or urinary symptoms, joint pains, increased weakness all 4 extremities, change in weight or appetite. She has been taking her medications regularly. She does report mild tremors in her hands which has been present for past several months.  Allergies: No Known Allergies  Prior to Admission medications   Medication Sig Start Date End Date Taking? Authorizing Provider  atorvastatin (LIPITOR) 40 MG tablet Take 1 tablet (40 mg total) by mouth daily at 6 PM. 07/29/13  Yes Daniel J Angiulli, PA-C  calcium carbonate (TUMS - DOSED IN MG ELEMENTAL CALCIUM) 500 MG chewable tablet Chew 1 tablet (200 mg of elemental calcium total) by mouth 2 (two) times daily with a meal. 07/29/13  Yes Daniel J Angiulli, PA-C  divalproex (DEPAKOTE ER) 500 MG 24 hr tablet Take 2 tablets (1,000 mg total) by mouth daily. 07/29/13  Yes Daniel J Angiulli, PA-C  hydrochlorothiazide  (MICROZIDE) 12.5 MG capsule Take 2 capsules (25 mg total) by mouth daily. 08/13/13  Yes Blondie Riggsbee, MD  lisinopril (PRINIVIL,ZESTRIL) 20 MG tablet Take 1 tablet (20 mg total) by mouth daily. 08/13/13  Yes Lucylle Foulkes, MD  pantoprazole (PROTONIX) 40 MG tablet Take 1 tablet (40 mg total) by mouth daily. 07/29/13  Yes Daniel J Angiulli, PA-C  potassium chloride SA (K-DUR,KLOR-CON) 20 MEQ tablet Take 1 tablet (20 mEq total) by mouth daily. 07/29/13  Yes Daniel J Angiulli, PA-C  Multiple Vitamins-Minerals (MULTIVITAMIN) tablet Take 1 tablet by mouth daily. 08/13/13   Huck Ashworth, MD  senna-docusate (SENOKOT-S) 8.6-50 MG per tablet Take 1 tablet by mouth 2 (two) times daily. 07/21/13   [place] Cyndie Mull, MD    Past Medical History  Diagnosis Date  . Hypertension     Past Surgical History  Procedure Laterality Date  . Ventriculostomy Left 07/07/2013    Procedure: VENTRICULOSTOMY;  Surgeon: Cristi Loron, MD;  Location: [facility];  Service: Neurosurgery;  Laterality: Left;  Left Ventriculostomy and Removal of Right Vetriculostomy    Social History:  reports that she has never smoked. She has never used smokeless tobacco. She reports that she does not drink alcohol or use illicit drugs.  Family History  Problem Relation Age of Onset  . Hypertension Mother   . Hypertension Father   . Diabetes Daughter     Review of Systems:  As outlined in history of present illness  Physical Exam:  Filed Vitals:   08/13/13 1115  BP: 154/89  Pulse: 69  Temp: 98 F (36.7 C)  TempSrc: Oral  Resp: 16  Height: 5\' 8"  (  1.727 m)  Weight: 248 lb (112.492 kg)  SpO2: 97%    Constitutional: Vital signs reviewed.  Patient is an obese middle aged female in no acute distress HEENT: No pallor, moist oral mucosa Cardiovascular: RRR, S1 normal, S2 normal, no MRG,  Pulmonary/Chest: CTAB, no wheezes, rales, or rhonchi Abdominal: Soft. Non-tender, non-distended, bowel sounds are  normal, Extremities: Warm, no edema CNS: AAO x3 no focal deficits, normal strength bilaterally, fine resting tremors Cerebellar function intact except for mild past pointing over her left side  Labs on Admission:  No results found for this or any previous visit (from the past 48 hour(s)).  Radiological Exams on Admission: No results found.  Assessment/Plan Intracranial hemorrhage with uncontrolled BP Asymptomatic at present with equal muscle strength bilaterally. Blood pressure is mildly elevated. I would double her dose of HCTZ and lisinopril and have her followup in one month. Has followup with outpatient rehabilitation and neurosurgery later this month. Counseled on lifestyle modifications including diet instruction, avoiding additional salt intake in diet, avoiding canned foods and vegetables and diet rich in high sodium content. Continue Depakote for seizure prophylaxis and to followup with her neurosurgeon. Continue Lipitor.  Hyperlipidemia Continue Lipitor  prediabetes Hemoglobin A1c of 6. Counseled  on diet restriction and regular exercise to reduce weight  Tremors Appears to be mild resting tremor. Will not start any medications at this time.   Discuss  further health maintenance on followup visit  [name] 08/13/2013, 11:32 AM

## 2013-08-18 ENCOUNTER — Encounter: Payer: No Typology Code available for payment source | Admitting: Physical Medicine & Rehabilitation

## 2013-08-22 ENCOUNTER — Encounter: Payer: Self-pay | Admitting: Physical Medicine & Rehabilitation

## 2013-08-22 ENCOUNTER — Encounter
Payer: No Typology Code available for payment source | Attending: Physical Medicine & Rehabilitation | Admitting: Physical Medicine & Rehabilitation

## 2013-08-22 VITALS — BP 138/84 | HR 71 | Resp 16 | Ht 68.0 in | Wt 245.0 lb

## 2013-08-22 DIAGNOSIS — Z79899 Other long term (current) drug therapy: Secondary | ICD-10-CM | POA: Insufficient documentation

## 2013-08-22 DIAGNOSIS — Z982 Presence of cerebrospinal fluid drainage device: Secondary | ICD-10-CM | POA: Insufficient documentation

## 2013-08-22 DIAGNOSIS — I10 Essential (primary) hypertension: Secondary | ICD-10-CM | POA: Insufficient documentation

## 2013-08-22 DIAGNOSIS — I619 Nontraumatic intracerebral hemorrhage, unspecified: Secondary | ICD-10-CM | POA: Insufficient documentation

## 2013-08-22 MED ORDER — DIVALPROEX SODIUM ER 500 MG PO TB24: 500.0000 mg | ORAL_TABLET | Freq: Every day | ORAL | Status: DC

## 2013-08-22 NOTE — Progress Notes (Signed)
Subjective:    Patient ID: Alison Ward, female    DOB: Dec 25, 1960, 52 y.o.   MRN: [id number]  HPI  Alison Ward is back regarding her ICH. Home health therapy has completed. She is awaiting her orange card so that she can start outpt therapies.   She continues to have some STM deficits, higher level balance issues. She'll tend to fatigue more easily. She sometimes gets overwhelmed when there are a lot of distractions around.    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 She was working 20 hours per week prior to the hemorrhage. There is no pressure to return the position at this point, but she would like to return to her job if possible.    Pain Inventory Average Pain 0 Pain Right Now 0 My pain is n/a  In the last 24 hours, has pain interfered with the following? General activity 0 Relation with others 0 Enjoyment of life 0 What TIME of day is your pain at its worst? n/a Sleep (in general) Fair  Pain is worse with: unsure Pain improves with: n/a Relief from Meds: n/a  Mobility walk without assistance how many minutes can you walk? as needed ability to climb steps?  yes do you drive?  no  Function not employed: date last employed 09/11 Do you have any goals in this area?  yes  Neuro/Psych No problems in this area  Prior Studies Any changes since last visit?  no  Physicians involved in your care Any changes since last visit?  no   Family History  Problem Relation Age of Onset  . Hypertension Mother   . Hypertension Father   . Diabetes Daughter    History   Social History  . Marital Status: Divorced    Spouse Name: N/A    Number of Children: N/A  . Years of Education: N/A   Social History Main Topics  . Smoking status: Never Smoker   . Smokeless tobacco: Never Used  . Alcohol Use: No  . Drug Use: No  . Sexual Activity: None   Other Topics Concern  . None   Social History Narrative  . None   Past Surgical History   Procedure Laterality Date  . Ventriculostomy Left 07/07/2013    Procedure: VENTRICULOSTOMY;  Surgeon: Cristi Loron, MD;  Location: [facility];  Service: Neurosurgery;  Laterality: Left;  Left Ventriculostomy and Removal of Right Vetriculostomy   Past Medical History  Diagnosis Date  . Hypertension    BP 138/84  Pulse 71  Resp 16  Ht 5\' 8"  (1.727 m)  Wt 245 lb (111.131 kg)  BMI 37.26 kg/m2  SpO2 98%  LMP 11/24/2011     Review of Systems  All other systems reviewed and are negative.       Objective:   Physical Exam  Constitutional: She is oriented to person, place, and time. She appears well-developed. Right leg hanging off the bed  Eyes: EOM are normal.  Neck: Normal range of motion. Neck supple. No thyromegaly present.  Cardiovascular: Normal rate and regular rhythm.  Pulmonary/Chest: Effort normal and breath sounds normal. No respiratory distress.  Abdominal: Soft. Bowel sounds are normal. She exhibits no distension.  Neurological: She is alert and oriented to person, place, and time.  Intact insight and awareness. Serial 7's were intact on second try. She was able to spell the word "world" forward and backwards. She sequenced #'s without issue.  Followed three physical commands in sequence with  one small mistake. Abstract thinking was intact. She didn't recall any of 3 words after 5 minutes with distractions.  She is a little impulsive and is distracted slightly at times. Balance and strength are good. She still has a mild left inattention Musc: minimal pain today  Skin: intact Psychiatric: She has a normal mood and affect. Her behavior is normal    Assessment/Plan:  1.  Right temporoparietal ICH. Status post burr hole ventriculostomy/ inpatient rehab admit  -still with higher level cognitive deficits and mild left inattention 2. Pain Management: Tylenol as needed. --minimal at this point 3.  Seizure prophylaxis. Depakote ER decrease to 500mg  to see if this  helps cognitive focus. 4. Hypertension. Hydrochlorothiazide 12.5 mg daily, lisinopril 20 mg daily.

## 2013-08-22 NOTE — Patient Instructions (Signed)
CALL ME WITH ANY PROBLEMS OR QUESTIONS (#[phone]).  HAVE A GOOD DAY  

## 2013-09-02 ENCOUNTER — Ambulatory Visit: Payer: No Typology Code available for payment source | Attending: Physical Medicine & Rehabilitation

## 2013-09-02 DIAGNOSIS — IMO0001 Reserved for inherently not codable concepts without codable children: Secondary | ICD-10-CM | POA: Insufficient documentation

## 2013-09-02 DIAGNOSIS — R269 Unspecified abnormalities of gait and mobility: Secondary | ICD-10-CM | POA: Insufficient documentation

## 2013-09-05 ENCOUNTER — Ambulatory Visit: Payer: No Typology Code available for payment source

## 2013-09-11 ENCOUNTER — Ambulatory Visit: Payer: No Typology Code available for payment source | Admitting: Speech Pathology

## 2013-09-16 ENCOUNTER — Ambulatory Visit: Payer: No Typology Code available for payment source | Admitting: Internal Medicine

## 2013-09-16 ENCOUNTER — Encounter: Payer: No Typology Code available for payment source | Admitting: Speech Pathology

## 2013-09-18 ENCOUNTER — Ambulatory Visit: Payer: No Typology Code available for payment source

## 2013-09-19 ENCOUNTER — Encounter: Payer: Self-pay | Admitting: Internal Medicine

## 2013-09-19 ENCOUNTER — Ambulatory Visit: Payer: No Typology Code available for payment source | Attending: Internal Medicine | Admitting: Internal Medicine

## 2013-09-19 VITALS — BP 137/86 | HR 63 | Temp 98.9°F | Resp 16 | Ht 68.0 in | Wt 247.0 lb

## 2013-09-19 DIAGNOSIS — I10 Essential (primary) hypertension: Secondary | ICD-10-CM

## 2013-09-19 LAB — CBC WITH DIFFERENTIAL/PLATELET
Basophils Absolute: 0 10*3/uL (ref 0.0–0.1)
Hemoglobin: 12.5 g/dL (ref 12.0–15.0)
Lymphs Abs: 2.2 10*3/uL (ref 0.7–4.0)
MCH: 28.8 pg (ref 26.0–34.0)
Monocytes Relative: 9 % (ref 3–12)
Neutro Abs: 1.8 10*3/uL (ref 1.7–7.7)
Neutrophils Relative %: 40 % — ABNORMAL LOW (ref 43–77)
Platelets: 213 10*3/uL (ref 150–400)
RBC: 4.34 MIL/uL (ref 3.87–5.11)
WBC: 4.5 10*3/uL (ref 4.0–10.5)

## 2013-09-19 LAB — LIPID PANEL
Cholesterol: 155 mg/dL (ref 0–200)
HDL: 41 mg/dL (ref >39)
Total CHOL/HDL Ratio: 3.8 Ratio
Triglycerides: 167 mg/dL — ABNORMAL HIGH (ref <150)
VLDL: 33 mg/dL (ref 0–40)

## 2013-09-19 LAB — COMPLETE METABOLIC PANEL WITH GFR
ALT: 8 U/L (ref 0–35)
CO2: 30 mEq/L (ref 19–32)
Calcium: 9.4 mg/dL (ref 8.4–10.5)
Creat: 0.93 mg/dL (ref 0.50–1.10)
GFR, Est African American: 81 mL/min
Potassium: 3.9 mEq/L (ref 3.5–5.3)
Sodium: 137 mEq/L (ref 135–145)
Total Bilirubin: 1.3 mg/dL — ABNORMAL HIGH (ref 0.3–1.2)
Total Protein: 7.3 g/dL (ref 6.0–8.3)

## 2013-09-19 NOTE — Progress Notes (Signed)
Pt is here following up on her stroke. Pt reports that she is doing great and in great health

## 2013-09-19 NOTE — Progress Notes (Signed)
Patient ID: Alison Ward, female   DOB: 12-04-1960, 52 y.o.   MRN: [id number]   CC:  HPI: 52 year old female with a history of intracranial hemorrhage, status post completion of rehabilitation, being closely followed by Dr. Hermelinda Medicus and Dr. Lovell Sheehan from neurosurgery. She denies any visual deficits denies any cognitive deficits denies any decreased sense of balance, denies any motor deficits The patient has gone back to work and is currently working 2 hours a week  Saw Dr. Hermelinda Medicus on 10/24 and her Depakote was decreased  No Known Allergies Past Medical History  Diagnosis Date  . Hypertension    Current Outpatient Prescriptions on File Prior to Visit  Medication Sig Dispense Refill  . atorvastatin (LIPITOR) 40 MG tablet Take 1 tablet (40 mg total) by mouth daily at 6 PM.  30 tablet  1  . divalproex (DEPAKOTE ER) 500 MG 24 hr tablet Take 1 tablet (500 mg total) by mouth daily.  30 tablet  1  . hydrochlorothiazide (MICROZIDE) 12.5 MG capsule Take 2 capsules (25 mg total) by mouth daily.  60 capsule  3  . lisinopril (PRINIVIL,ZESTRIL) 20 MG tablet Take 1 tablet (20 mg total) by mouth daily.  60 tablet  3  . Multiple Vitamins-Minerals (MULTIVITAMIN) tablet Take 1 tablet by mouth daily.  30 tablet  0  . potassium chloride SA (K-DUR,KLOR-CON) 20 MEQ tablet Take 1 tablet (20 mEq total) by mouth daily.  30 tablet  1  . calcium carbonate (TUMS - DOSED IN MG ELEMENTAL CALCIUM) 500 MG chewable tablet Chew 1 tablet (200 mg of elemental calcium total) by mouth 2 (two) times daily with a meal.  60 tablet  1  . pantoprazole (PROTONIX) 40 MG tablet Take 1 tablet (40 mg total) by mouth daily.  30 tablet  1   No current facility-administered medications on file prior to visit.   Family History  Problem Relation Age of Onset  . Hypertension Mother   . Hypertension Father   . Diabetes Daughter    History   Social History  . Marital Status: Divorced    Spouse Name: N/A    Number of Children: N/A   . Years of Education: N/A   Occupational History  . Not on file.   Social History Main Topics  . Smoking status: Never Smoker   . Smokeless tobacco: Never Used  . Alcohol Use: No  . Drug Use: No  . Sexual Activity: Not on file   Other Topics Concern  . Not on file   Social History Narrative  . No narrative on file    Review of Systems  Constitutional: Negative for fever, chills, diaphoresis, activity change, appetite change and fatigue.  HENT: Negative for ear pain, nosebleeds, congestion, facial swelling, rhinorrhea, neck pain, neck stiffness and ear discharge.   Eyes: Negative for pain, discharge, redness, itching and visual disturbance.  Respiratory: Negative for cough, choking, chest tightness, shortness of breath, wheezing and stridor.   Cardiovascular: Negative for chest pain, palpitations and leg swelling.  Gastrointestinal: Negative for abdominal distention.  Genitourinary: Negative for dysuria, urgency, frequency, hematuria, flank pain, decreased urine volume, difficulty urinating and dyspareunia.  Musculoskeletal: Negative for back pain, joint swelling, arthralgias and gait problem.  Neurological: Negative for dizziness, tremors, seizures, syncope, facial asymmetry, speech difficulty, weakness, light-headedness, numbness and headaches.  Hematological: Negative for adenopathy. Does not bruise/bleed easily.  Psychiatric/Behavioral: Negative for hallucinations, behavioral problems, confusion, dysphoric mood, decreased concentration and agitation.    Objective:   Filed Vitals:  09/19/13 1230  BP: 137/86  Pulse: 63  Temp: 98.9 F (37.2 C)  Resp: 16    Physical Exam  Constitutional: Appears well-developed and well-nourished. No distress.  HENT: Normocephalic. External right and left ear normal. Oropharynx is clear and moist.  Eyes: Conjunctivae and EOM are normal. PERRLA, no scleral icterus.  Neck: Normal ROM. Neck supple. No JVD. No tracheal deviation. No  thyromegaly.  CVS: RRR, S1/S2 +, no murmurs, no gallops, no carotid bruit.  Pulmonary: Effort and breath sounds normal, no stridor, rhonchi, wheezes, rales.  Abdominal: Soft. BS +,  no distension, tenderness, rebound or guarding.  Musculoskeletal: Normal range of motion. No edema and no tenderness.  Lymphadenopathy: No lymphadenopathy noted, cervical, inguinal. Neuro: Alert. Normal reflexes, muscle tone coordination. No cranial nerve deficit. Skin: Skin is warm and dry. No rash noted. Not diaphoretic. No erythema. No pallor.  Psychiatric: Normal mood and affect. Behavior, judgment, thought content normal.   Lab Results  Component Value Date   WBC 6.1 07/22/2013   HGB 11.0* 07/22/2013   HCT 33.2* 07/22/2013   MCV 86.5 07/22/2013   PLT 245 07/22/2013   Lab Results  Component Value Date   CREATININE 1.10 07/28/2013   BUN 8 07/22/2013   NA 138 07/22/2013   K 3.6 07/22/2013   CL 99 07/22/2013   CO2 28 07/22/2013    Lab Results  Component Value Date   HGBA1C 6.0* 07/06/2013   Lipid Panel     Component Value Date/Time   CHOL 244* 07/06/2013 0420   TRIG 153* 07/06/2013 0420   HDL 45 07/06/2013 0420   CHOLHDL 5.4 07/06/2013 0420   VLDL 31 07/06/2013 0420   LDLCALC 168* 07/06/2013 0420       Assessment and plan:   Patient Active Problem List   Diagnosis Date Noted  . Obesity, unspecified 08/13/2013  . 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 ICH (intracerebral hemorrhage) 07/21/2013  . Hypokalemia 07/21/2013  . Intracerebral hemorrhage 07/06/2013  . HYPERTENSION 02/24/2009       Hypertension Blood pressure well controlled today No change in her medications We'll check her kidney function  Seizure prophylaxis on Depakote Reported history of seizures next number check a Depakote and ammonia level  Right temporoparietal intracranial hemorrhage Status post burr hole/ventriculostomy followed by inpatient rehabilitation Cognitively improving  Obtain baseline labs  Followup in  2 months  The patient was given clear instructions to go to ER or return to medical center if symptoms don't improve, worsen or new problems develop. The patient verbalized understanding. The patient was told to call to get any lab results if not heard anything in the next week.

## 2013-09-20 LAB — VALPROIC ACID LEVEL: Valproic Acid Lvl: <1 ug/mL — ABNORMAL LOW (ref 50.0–100.0)

## 2013-09-22 ENCOUNTER — Encounter: Payer: No Typology Code available for payment source | Admitting: Speech Pathology

## 2013-10-02 ENCOUNTER — Ambulatory Visit: Payer: No Typology Code available for payment source

## 2013-10-03 ENCOUNTER — Ambulatory Visit: Payer: No Typology Code available for payment source

## 2013-10-17 ENCOUNTER — Encounter
Payer: No Typology Code available for payment source | Attending: Physical Medicine & Rehabilitation | Admitting: Physical Medicine & Rehabilitation

## 2013-10-17 ENCOUNTER — Encounter: Payer: Self-pay | Admitting: Physical Medicine & Rehabilitation

## 2013-10-17 VITALS — BP 146/90 | HR 82 | Resp 14 | Ht 68.0 in | Wt 245.0 lb

## 2013-10-17 DIAGNOSIS — I10 Essential (primary) hypertension: Secondary | ICD-10-CM | POA: Insufficient documentation

## 2013-10-17 DIAGNOSIS — I6389 Other cerebral infarction: Secondary | ICD-10-CM

## 2013-10-17 DIAGNOSIS — I6350 Cerebral infarction due to unspecified occlusion or stenosis of unspecified cerebral artery: Secondary | ICD-10-CM

## 2013-10-17 MED ORDER — POTASSIUM CHLORIDE ER 10 MEQ PO TBCR: 20.0000 meq | EXTENDED_RELEASE_TABLET | Freq: Every day | ORAL | Status: DC

## 2013-10-17 NOTE — Progress Notes (Signed)
Subjective:    Patient ID: Alison Ward, female    DOB: October 14, 1961, 52 y.o.   MRN: [id number]  HPI   Alison Ward is back regarding her ICH. She is finishing up therapy at this point. She has two visits left with outpatient therapy.   She has been going into the office for 3-4 hours 2 days a week. Things have gone pretty well for the most part. She denies struggling with any specific part of her job. Her plan is potentially to return to work in January. She would like to work full time. Her job is sedentary responding to emails, making schedules, using spreadsheets.   She stopped taking the depakote when it ran out last month and did not call for any refills.     Pain Inventory Average Pain 0 Pain Right Now 0 My pain is n/a  In the last 24 hours, has pain interfered with the following? General activity 0 Relation with others 0 Enjoyment of life 0 What TIME of day is your pain at its worst? n/a Sleep (in general) Fair  Pain is worse with: n/a Pain improves with: n/a Relief from Meds: n/a  Mobility walk without assistance ability to climb steps?  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  yes  Neuro/Psych No problems in this area  Prior Studies Any changes since last visit?  no  Physicians involved in your care Any changes since last visit?  no   Family History  Problem Relation Age of Onset  . Hypertension Mother   . Hypertension Father   . Diabetes Daughter    History   Social History  . Marital Status: Divorced    Spouse Name: N/A    Number of Children: N/A  . Years of Education: N/A   Social History Main Topics  . Smoking status: Never Smoker   . Smokeless tobacco: Never Used  . Alcohol Use: No  . Drug Use: No  . Sexual Activity: None   Other Topics Concern  . None   Social History Narrative  . None   Past Surgical History  Procedure Laterality Date  . Ventriculostomy Left 07/07/2013     Procedure: VENTRICULOSTOMY;  Surgeon: Cristi Loron, MD;  Location: [facility];  Service: Neurosurgery;  Laterality: Left;  Left Ventriculostomy and Removal of Right Vetriculostomy   Past Medical History  Diagnosis Date  . Hypertension    BP 146/90  Pulse 82  Resp 14  Ht 5\' 8"  (1.727 m)  Wt 245 lb (111.131 kg)  BMI 37.26 kg/m2  SpO2 99%  LMP 11/24/2011     Review of Systems  All other systems reviewed and are negative.       Objective:   Physical Exam Constitutional: She is oriented to person, place, and time. She appears well-developed. Right leg hanging off the bed  Eyes: EOM are normal.  Neck: Normal range of motion. Neck supple. No thyromegaly present.  Cardiovascular: Normal rate and regular rhythm.  Pulmonary/Chest: Effort normal and breath sounds normal. No respiratory distress.  Abdominal: Soft. Bowel sounds are normal. She exhibits no distension.  Neurological: She is alert and oriented to person, place, and time.  Intact insight and awareness. Serial 7's were intact  Abstract thinking was intact. She didn't recall any of 3 words after 5 minutes with distractions.  Balance and strength are good. Insight and awareness are normal. Attention better to the left.  Musc: minimal pain today  Skin: intact Psychiatric: She has a normal mood and affect. Her behavior is normal   Assessment/Plan:  1. Right temporoparietal ICH. Status post burr hole ventriculostomy/ inpatient rehab admit  -doing well. Has largely recovered -wrote the patient a return to work letter today. 2. Pain Management: Tylenol as needed. --minimal at this point  3. Seizure prophylaxis. My plan was to continue the depakote for another 3 months but given the month+ without the drug and lack of sequelae, I will not resume the medication at this time. 4. Hypertension. Hydrochlorothiazide 12.5 mg daily, lisinopril 20 mg daily.   -?recheck BMET next month  -gave her a RF on kdur today  qd.  I will see her back as needed. 30 minutes of face to face patient care time were spent during this visit. All questions were encouraged and answered.

## 2013-10-17 NOTE — Patient Instructions (Signed)
CALL ME WITH ANY PROBLEMS OR QUESTIONS (#[phone]).    HAPPY HOLIDAYS!!!!!   

## 2013-10-21 ENCOUNTER — Ambulatory Visit
Payer: No Typology Code available for payment source | Attending: Physical Medicine & Rehabilitation | Admitting: Speech Pathology

## 2013-10-21 DIAGNOSIS — IMO0001 Reserved for inherently not codable concepts without codable children: Secondary | ICD-10-CM | POA: Insufficient documentation

## 2013-10-21 DIAGNOSIS — R269 Unspecified abnormalities of gait and mobility: Secondary | ICD-10-CM | POA: Insufficient documentation

## 2013-10-28 ENCOUNTER — Ambulatory Visit: Payer: No Typology Code available for payment source | Admitting: Speech Pathology

## 2013-11-13 ENCOUNTER — Ambulatory Visit: Payer: No Typology Code available for payment source | Attending: Internal Medicine | Admitting: Internal Medicine

## 2013-11-13 ENCOUNTER — Encounter: Payer: Self-pay | Admitting: Internal Medicine

## 2013-11-13 VITALS — BP 144/95 | HR 76 | Temp 98.9°F | Resp 16 | Ht 68.0 in | Wt 243.0 lb

## 2013-11-13 DIAGNOSIS — E119 Type 2 diabetes mellitus without complications: Secondary | ICD-10-CM

## 2013-11-13 DIAGNOSIS — R51 Headache: Secondary | ICD-10-CM | POA: Insufficient documentation

## 2013-11-13 NOTE — Progress Notes (Signed)
For the past 2 weeks she has been feeling a tightness in her forehead. She is also having pressure at her temples that comes and goes. She states that she is having numbness in her finger tips at her left hand.

## 2013-11-13 NOTE — Progress Notes (Signed)
Patient ID: Alison Ward, female   DOB: November 20, 1960, 53 y.o.   MRN: [id number]   CC:  HPI: 53 year old female with a history of intracranial hemorrhage, presents for a followup. The patient has been seen for physiatry , Dr. Tessa Lerner as well as Dr. Arnoldo Morale neurosurgery. She discontinued her Depakote in December. Today she complains of bitemporal pressure, no blurry vision no nausea no vomiting, occasional frontal headaches. No seizures    No Known Allergies Past Medical History  Diagnosis Date  . Hypertension    Current Outpatient Prescriptions on File Prior to Visit  Medication Sig Dispense Refill  . hydrochlorothiazide (MICROZIDE) 12.5 MG capsule Take 2 capsules (25 mg total) by mouth daily.  60 capsule  3  . lisinopril (PRINIVIL,ZESTRIL) 20 MG tablet Take 1 tablet (20 mg total) by mouth daily.  60 tablet  3  . potassium chloride (K-DUR) 10 MEQ tablet Take 2 tablets (20 mEq total) by mouth daily.  30 tablet  5   No current facility-administered medications on file prior to visit.   Family History  Problem Relation Age of Onset  . Hypertension Mother   . Hypertension Father   . Diabetes Daughter    History   Social History  . Marital Status: Divorced    Spouse Name: N/A    Number of Children: N/A  . Years of Education: N/A   Occupational History  . Not on file.   Social History Main Topics  . Smoking status: Never Smoker   . Smokeless tobacco: Never Used  . Alcohol Use: No  . Drug Use: No  . Sexual Activity: Not on file   Other Topics Concern  . Not on file   Social History Narrative  . No narrative on file    Review of Systems  Constitutional: Negative for fever, chills, diaphoresis, activity change, appetite change and fatigue.  HENT: Negative for ear pain, nosebleeds, congestion, facial swelling, rhinorrhea, neck pain, neck stiffness and ear discharge.   Eyes: Negative for pain, discharge, redness, itching and visual disturbance.  Respiratory: Negative  for cough, choking, chest tightness, shortness of breath, wheezing and stridor.   Cardiovascular: Negative for chest pain, palpitations and leg swelling.  Gastrointestinal: Negative for abdominal distention.  Genitourinary: Negative for dysuria, urgency, frequency, hematuria, flank pain, decreased urine volume, difficulty urinating and dyspareunia.  Musculoskeletal: Negative for back pain, joint swelling, arthralgias and gait problem.  Neurological: As in history of present illness Hematological: Negative for adenopathy. Does not bruise/bleed easily.  Psychiatric/Behavioral: Negative for hallucinations, behavioral problems, confusion, dysphoric mood, decreased concentration and agitation.    Objective:   Filed Vitals:   11/13/13 1512  BP: 144/95  Pulse: 76  Temp: 98.9 F (37.2 C)  Resp: 16    Physical Exam  Constitutional: Appears well-developed and well-nourished. No distress.  HENT: Normocephalic. External right and left ear normal. Oropharynx is clear and moist.  Eyes: Conjunctivae and EOM are normal. PERRLA, no scleral icterus.  Neck: Normal ROM. Neck supple. No JVD. No tracheal deviation. No thyromegaly.  CVS: RRR, S1/S2 +, no murmurs, no gallops, no carotid bruit.  Pulmonary: Effort and breath sounds normal, no stridor, rhonchi, wheezes, rales.  Abdominal: Soft. BS +,  no distension, tenderness, rebound or guarding.  Musculoskeletal: Normal range of motion. No edema and no tenderness.  Lymphadenopathy: No lymphadenopathy noted, cervical, inguinal. Neuro: Alert. Normal reflexes, muscle tone coordination. No cranial nerve deficit. Skin: Skin is warm and dry. No rash noted. Not diaphoretic. No erythema. No pallor.  Psychiatric: Normal mood and affect. Behavior, judgment, thought content normal.   Lab Results  Component Value Date   WBC 4.5 09/19/2013   HGB 12.5 09/19/2013   HCT 36.7 09/19/2013   MCV 84.6 09/19/2013   PLT 213 09/19/2013   Lab Results  Component Value  Date   CREATININE 0.93 09/19/2013   BUN 12 09/19/2013   NA 137 09/19/2013   K 3.9 09/19/2013   CL 99 09/19/2013   CO2 30 09/19/2013    Lab Results  Component Value Date   HGBA1C 6.0* 07/06/2013   Lipid Panel     Component Value Date/Time   CHOL 155 09/19/2013 1309   TRIG 167* 09/19/2013 1309   HDL 41 09/19/2013 1309   CHOLHDL 3.8 09/19/2013 1309   VLDL 33 09/19/2013 1309   LDLCALC 81 09/19/2013 1309       Assessment and plan:   Patient Active Problem List   Diagnosis Date Noted  . Obesity, unspecified 08/13/2013  . 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 ICH (intracerebral hemorrhage) 07/21/2013  . Hypokalemia 07/21/2013  . Intracerebral hemorrhage 07/06/2013  . HYPERTENSION 02/24/2009       Chronic headache Status post Right temporoparietal ICH. Status post burr hole ventriculostomy/ inpatient rehab admit  -doing well.  Because of new headache, will repeat CT head Patient encouraged to call Dr. Arnoldo Morale to verify if she needs a CT of the head or an MRI of the brain   Hypertension well controlled continue hydrochlorothiazide and lisinopril BMP stable  Followup in 2 months  The patient was given clear instructions to go to ER or return to medical center if symptoms don't improve, worsen or new problems develop. The patient verbalized understanding. The patient was told to call to get any lab results if not heard anything in the next week.   6

## 2013-11-17 ENCOUNTER — Ambulatory Visit (HOSPITAL_COMMUNITY)
Admission: RE | Admit: 2013-11-17 | Discharge: 2013-11-17 | Disposition: A | Discharge status: Home / Self Care | Payer: No Typology Code available for payment source | Source: Ambulatory Visit | Attending: Internal Medicine | Admitting: Internal Medicine

## 2013-11-17 DIAGNOSIS — R51 Headache: Secondary | ICD-10-CM | POA: Insufficient documentation

## 2013-11-17 DIAGNOSIS — G9389 Other specified disorders of brain: Secondary | ICD-10-CM | POA: Insufficient documentation

## 2013-11-17 DIAGNOSIS — E119 Type 2 diabetes mellitus without complications: Secondary | ICD-10-CM

## 2013-11-19 ENCOUNTER — Ambulatory Visit: Payer: No Typology Code available for payment source | Admitting: Internal Medicine

## 2013-11-19 ENCOUNTER — Telehealth: Payer: Self-pay | Admitting: *Deleted

## 2013-11-19 NOTE — Telephone Encounter (Signed)
Message copied by Margarett Viti, [place] R on Wed Nov 19, 2013  9:16 AM ------      Message from: Allyson Sabal MD, [facility]      Created: Tue Nov 18, 2013 11:31 AM       Notify patient that the CT head appears stable. No new findings compared to her old CT scan except scarring from recent surgery. ------

## 2013-11-26 ENCOUNTER — Encounter: Payer: Self-pay | Admitting: Neurology

## 2013-11-26 ENCOUNTER — Encounter (INDEPENDENT_AMBULATORY_CARE_PROVIDER_SITE_OTHER): Payer: Self-pay

## 2013-11-26 ENCOUNTER — Ambulatory Visit (INDEPENDENT_AMBULATORY_CARE_PROVIDER_SITE_OTHER): Payer: Self-pay | Admitting: Neurology

## 2013-11-26 VITALS — BP 133/86 | HR 75 | Ht 68.0 in | Wt 249.0 lb

## 2013-11-26 DIAGNOSIS — G44209 Tension-type headache, unspecified, not intractable: Secondary | ICD-10-CM

## 2013-11-26 MED ORDER — DIVALPROEX SODIUM ER 500 MG PO TB24: 500.0000 mg | ORAL_TABLET | Freq: Every day | ORAL | Status: DC

## 2013-11-26 NOTE — Progress Notes (Signed)
[facility] [address] [place]. [place] [address] [phone]       OFFICE FOLLOW-UP NOTE  Alison Ward Date of Birth:  January 29, 1961 Medical Record Number:  [id number]   HPI: 16 year African American lady seen for first office followup visit hospital admission 07/06/13 for intracerebral hemorrhage. She presented with headache and lethargy with elevated blood pressure of 202/98. CT scan of the head showed a 4. 1 x 2.1 cm right parietal temporal intracerebral hemorrhage with surrounding edema and intraventricular extension azygos 6 mm right-to-left midline shift. She was seen by neurosurgery and underwent emergent right ventriculostomy catheter placement Dr. Arnoldo Morale. She was kept in the intensive care unit and blood pressure was tightly controlled. Carotid Doppler showed nonobstructive stenosis. Stress echo showed normal ejection fraction. Ventriculostomy was subsequently clamped and removed. She had mild left-sided weakness which was improving. She was transferred to inpatient rehabilitation and did well and has been discharged home. She is often substantial improvement in her left-sided strength and is now able to walk independently. She has only mild diminished fine motor skills in the left hand. She states her blood pressure is well controlled than it is 133/86 in the office today. She started working part-time as a Counsellor and feels that her headaches seem to limiting her ability to work. She describes bitemporal pressure-like headaches off and on they're more prominent in the morning. She does not take any medications for this. She admits to diminished attention, concentration, disorganization as well as trouble with multitasking. She occasionally feels off balance when she is tired. She was actually discharged on Depakote for headache but she ran out of prescriptions a month ago.  ROS:   14 system review of systems is positive for fatigue, numbness, weakness, aching  muscles only and all other systems negative  PMH:  Past Medical History  Diagnosis Date  . Hypertension Right temporoparietal intracerebral hemorrhage September 30 14 status post ventriculostomy      Social History:  History   Social History  . Marital Status: Divorced    Spouse Name: N/A    Number of Children: 3  . Years of Education: college   Occupational History  . [employer]    Social History Main Topics  . Smoking status: Never Smoker   . Smokeless tobacco: Never Used  . Alcohol Use: No  . Drug Use: No  . Sexual Activity: Not on file   Other Topics Concern  . Not on file   Social History Narrative  . No narrative on file    Medications:   Current Outpatient Prescriptions on File Prior to Visit  Medication Sig Dispense Refill  . hydrochlorothiazide (MICROZIDE) 12.5 MG capsule Take 2 capsules (25 mg total) by mouth daily.  60 capsule  3  . lisinopril (PRINIVIL,ZESTRIL) 20 MG tablet Take 1 tablet (20 mg total) by mouth daily.  60 tablet  3  . potassium chloride (K-DUR) 10 MEQ tablet Take 2 tablets (20 mEq total) by mouth daily.  30 tablet  5   No current facility-administered medications on file prior to visit.    Allergies:  No Known Allergies  Physical Exam General:  middle aged African American lady , seated, in no evident distress Head: head normocephalic and atraumatic. Orohparynx benign Neck: supple with no carotid or supraclavicular bruits Cardiovascular: regular rate and rhythm, no murmurs Musculoskeletal: no deformity Skin:  no rash/petichiae Vascular:  Normal pulses all extremities Filed Vitals:   11/26/13 1414  BP: 133/86  Pulse: 75   Neurologic Exam Mental Status: Awake and fully alert. Oriented to place and time. Recent and remote memory intact. Attention span, concentration and fund of knowledge appropriate. Mood and affect appropriate.  Cranial Nerves: Fundoscopic exam reveals sharp disc margins. Pupils equal, briskly reactive  to light. Extraocular movements full without nystagmus. Visual fields full to confrontation. Hearing intact. Facial sensation intact. Face, tongue, palate moves normally and symmetrically.  Motor: Normal bulk and tone. Normal strength in all tested extremity muscles. Diminished fine finger movements on the left. Orbits right over left upper extremity. Sensory.: intact to touch and pinprick and vibratory sensation.  Coordination: Rapid alternating movements normal in all extremities. Finger-to-nose and heel-to-shin performed accurately bilaterally. Gait and Station: Arises from chair without difficulty. Stance is normal. Gait demonstrates normal stride length and balance . Able to heel, toe and tandem walk without difficulty.  Reflexes: 1+ and symmetric. Toes downgoing.   NIHSS 0 Modified Rankin  1   ASSESSMENT: 28 year African American lady with a right temporoparietal intracerebral hemorrhage status post colostomy due to the hypertension in September 2014. She is doing quite well except for mild tension headaches    PLAN: I had a long discussion with the patient regarding her recent hospitalization frontal subdural hemorrhage, discussed the imaging findings, treatment and answered questions. I recommend starting Depakote ER 500 mg daily for her tension headaches and continue to maintain aggressive blood pressure control with goal below 130/90. Return for followup in 3 months Charlott Holler, NP. call earlier if necessary    Note: This document was prepared with digital dictation and possible smart phrase technology. Any transcriptional errors that result from this process are unintentional

## 2013-11-26 NOTE — Patient Instructions (Signed)
I had a long discussion with the patient regarding her recent hospitalization frontal subdural hemorrhage, discussed the imaging findings, treatment and answered questions. I recommend starting Depakote ER 500 mg daily for her tension headaches and continue to maintain aggressive blood pressure control with goal below 130/90. Return for followup in 3 months Charlott Holler, NP. call earlier if necessary

## 2013-12-08 ENCOUNTER — Telehealth: Payer: Self-pay | Admitting: Neurology

## 2013-12-08 NOTE — Telephone Encounter (Signed)
Continue present treatment and call back in 1-2 weeks if not better

## 2013-12-08 NOTE — Telephone Encounter (Signed)
Patient called to state that she is still feeling pressure in her head on and off, states that some days are better than others, for example yesterday she said it felt like she was wearing a tight swimming cap on her head. Patient is also experiencing face tingling. Patient notes that she has been taking her medication as directed. Please call and advise.

## 2013-12-09 NOTE — Telephone Encounter (Signed)
Shared Dr Clydene Fake message with patient, she verbalized understanding

## 2013-12-10 ENCOUNTER — Ambulatory Visit: Payer: Self-pay

## 2013-12-13 ENCOUNTER — Other Ambulatory Visit: Payer: Self-pay | Admitting: Internal Medicine

## 2013-12-23 ENCOUNTER — Ambulatory Visit: Payer: Self-pay

## 2013-12-29 ENCOUNTER — Telehealth: Payer: Self-pay | Admitting: Neurology

## 2013-12-29 NOTE — Telephone Encounter (Signed)
Pt called back within the 2 weeks she was advised, states she is still having same problems and that the medicine is not working. Pt would like Dr. Leonie Man or his nurse to return her call.

## 2013-12-29 NOTE — Telephone Encounter (Signed)
Patient calling back to let Dr. Leonie Man know that the medication is still not helping. Please advise.

## 2013-12-29 NOTE — Telephone Encounter (Signed)
I spoke to the patient who was tolerating Depakote well without side effects but no benefit hence advised her to increase it to twice daily and call back in 2 weeks. If still no benefit we'll discontinue and try something else

## 2014-01-01 ENCOUNTER — Ambulatory Visit: Payer: No Typology Code available for payment source | Attending: Internal Medicine

## 2014-01-12 ENCOUNTER — Ambulatory Visit: Payer: No Typology Code available for payment source | Attending: Internal Medicine | Admitting: Internal Medicine

## 2014-01-12 ENCOUNTER — Encounter: Payer: Self-pay | Admitting: Internal Medicine

## 2014-01-12 VITALS — BP 140/90 | HR 81 | Temp 98.3°F | Resp 16 | Wt 248.6 lb

## 2014-01-12 DIAGNOSIS — Z8249 Family history of ischemic heart disease and other diseases of the circulatory system: Secondary | ICD-10-CM | POA: Insufficient documentation

## 2014-01-12 DIAGNOSIS — I10 Essential (primary) hypertension: Secondary | ICD-10-CM | POA: Insufficient documentation

## 2014-01-12 DIAGNOSIS — Z139 Encounter for screening, unspecified: Secondary | ICD-10-CM

## 2014-01-12 DIAGNOSIS — Z833 Family history of diabetes mellitus: Secondary | ICD-10-CM | POA: Insufficient documentation

## 2014-01-12 DIAGNOSIS — Z1211 Encounter for screening for malignant neoplasm of colon: Secondary | ICD-10-CM

## 2014-01-12 NOTE — Patient Instructions (Signed)
DASH Diet  The DASH diet stands for "Dietary Approaches to Stop Hypertension." It is a healthy eating plan that has been shown to reduce high blood pressure (hypertension) in as little as 14 days, while also possibly providing other significant health benefits. These other health benefits include reducing the risk of breast cancer after menopause and reducing the risk of type 2 diabetes, heart disease, colon cancer, and stroke. Health benefits also include weight loss and slowing kidney failure in patients with chronic kidney disease.   DIET GUIDELINES  · Limit salt (sodium). Your diet should contain less than 1500 mg of sodium daily.  · Limit refined or processed carbohydrates. Your diet should include mostly whole grains. Desserts and added sugars should be used sparingly.  · Include small amounts of heart-healthy fats. These types of fats include nuts, oils, and tub margarine. Limit saturated and trans fats. These fats have been shown to be harmful in the body.  CHOOSING FOODS   The following food groups are based on a 2000 calorie diet. See your Registered Dietitian for individual calorie needs.  Grains and Grain Products (6 to 8 servings daily)  · Eat More Often: Whole-wheat bread, brown rice, whole-grain or wheat pasta, quinoa, popcorn without added fat or salt (air popped).  · Eat Less Often: White bread, white pasta, white rice, cornbread.  Vegetables (4 to 5 servings daily)  · Eat More Often: Fresh, frozen, and canned vegetables. Vegetables may be raw, steamed, roasted, or grilled with a minimal amount of fat.  · Eat Less Often/Avoid: Creamed or fried vegetables. Vegetables in a cheese sauce.  Fruit (4 to 5 servings daily)  · Eat More Often: All fresh, canned (in natural juice), or frozen fruits. Dried fruits without added sugar. One hundred percent fruit juice (½ cup [237 mL] daily).  · Eat Less Often: Dried fruits with added sugar. Canned fruit in light or heavy syrup.  Lean Meats, Fish, and Poultry (2  servings or less daily. One serving is 3 to 4 oz [85-114 g]).  · Eat More Often: Ninety percent or leaner ground beef, tenderloin, sirloin. Round cuts of beef, chicken breast, turkey breast. All fish. Grill, bake, or broil your meat. Nothing should be fried.  · Eat Less Often/Avoid: Fatty cuts of meat, turkey, or chicken leg, thigh, or wing. Fried cuts of meat or fish.  Dairy (2 to 3 servings)  · Eat More Often: Low-fat or fat-free milk, low-fat plain or light yogurt, reduced-fat or part-skim cheese.  · Eat Less Often/Avoid: Milk (whole, 2%). Whole milk yogurt. Full-fat cheeses.  Nuts, Seeds, and Legumes (4 to 5 servings per week)  · Eat More Often: All without added salt.  · Eat Less Often/Avoid: Salted nuts and seeds, canned beans with added salt.  Fats and Sweets (limited)  · Eat More Often: Vegetable oils, tub margarines without trans fats, sugar-free gelatin. Mayonnaise and salad dressings.  · Eat Less Often/Avoid: Coconut oils, palm oils, butter, stick margarine, cream, half and half, cookies, candy, pie.  FOR MORE INFORMATION  The Dash Diet Eating Plan: www.dashdiet.org  Document Released: 10/05/2011 Document Revised: 01/08/2012 Document Reviewed: 10/05/2011  ExitCare® Patient Information ©2014 [employer].

## 2014-01-12 NOTE — Telephone Encounter (Signed)
Pt called back states she does not have enough medication of her Depakote. Pt would like to either get a refill or get an apt to come in to speak with Dr. Leonie Man concerning this medication. Please call pt concerning this matter.

## 2014-01-12 NOTE — Progress Notes (Signed)
Patient here for follow up -stroke

## 2014-01-12 NOTE — Progress Notes (Signed)
MRN: [id number] Name: Alison Ward  Sex: female Age: 53 y.o. DOB: 05-01-1961  Allergies: Review of patient's allergies indicates no known allergies.  Chief Complaint  Patient presents with  . Follow-up    HPI: Patient is 53 y.o. female who has history of hypertension comes today for followup, as per patient she has been compliant with her blood pressure medication and checks her blood pressure at home and her systolic is usually 696E, she also history of tension headache and following up with neurology and is on Depakote.   Past Medical History  Diagnosis Date  . Hypertension     Past Surgical History  Procedure Laterality Date  . Ventriculostomy Left 07/07/2013    Procedure: VENTRICULOSTOMY;  Surgeon: Ophelia Charter, MD;  Location: [facility];  Service: Neurosurgery;  Laterality: Left;  Left Ventriculostomy and Removal of Right Vetriculostomy      Medication List       This list is accurate as of: 01/12/14  2:30 PM.  Always use your most recent med list.               divalproex 500 MG 24 hr tablet  Commonly known as:  DEPAKOTE ER  Take 1 tablet (500 mg total) by mouth daily.     hydrochlorothiazide 12.5 MG capsule  Commonly known as:  MICROZIDE  Take 2 capsules (25 mg total) by mouth daily.     lisinopril 20 MG tablet  Commonly known as:  PRINIVIL,ZESTRIL  Take 1 tablet (20 mg total) by mouth daily.     potassium chloride 10 MEQ tablet  Commonly known as:  K-DUR  Take 2 tablets (20 mEq total) by mouth daily.        No orders of the defined types were placed in this encounter.     There is no immunization history on file for this patient.  Family History  Problem Relation Age of Onset  . Hypertension Mother   . Hypertension Father   . Diabetes Daughter     History  Substance Use Topics  . Smoking status: Never Smoker   . Smokeless tobacco: Never Used  . Alcohol Use: No    Review of Systems   As noted in HPI  Filed Vitals:   01/12/14 1425  BP: 140/90  Pulse:   Temp:   Resp:     Physical Exam  Physical Exam  Constitutional: No distress.  Eyes: EOM are normal. Pupils are equal, round, and reactive to light.  Cardiovascular: Normal rate and regular rhythm.   Pulmonary/Chest: Breath sounds normal. No respiratory distress. She has no wheezes. She has no rales.    CBC    Component Value Date/Time   WBC 4.5 09/19/2013 1304   RBC 4.34 09/19/2013 1304   HGB 12.5 09/19/2013 1304   HCT 36.7 09/19/2013 1304   PLT 213 09/19/2013 1304   MCV 84.6 09/19/2013 1304   LYMPHSABS 2.2 09/19/2013 1304   MONOABS 0.4 09/19/2013 1304   EOSABS 0.1 09/19/2013 1304   BASOSABS 0.0 09/19/2013 1304    CMP     Component Value Date/Time   NA 137 09/19/2013 1304   K 3.9 09/19/2013 1304   CL 99 09/19/2013 1304   CO2 30 09/19/2013 1304   GLUCOSE 80 09/19/2013 1304   BUN 12 09/19/2013 1304   CREATININE 0.93 09/19/2013 1304   CREATININE 1.10 07/28/2013 0535   CALCIUM 9.4 09/19/2013 1304   PROT 7.3 09/19/2013 1304   ALBUMIN 4.0 09/19/2013  1304   AST 13 09/19/2013 1304   ALT 8 09/19/2013 1304   ALKPHOS 51 09/19/2013 1304   BILITOT 1.3* 09/19/2013 1304   GFRNONAA 56* 07/28/2013 0535   GFRAA 65* 07/28/2013 0535    Lab Results  Component Value Date/Time   CHOL 155 09/19/2013  1:09 PM    No components found with this basename: hga1c    Lab Results  Component Value Date/Time   AST 13 09/19/2013  1:04 PM    Assessment and Plan  Essential hypertension, benign Patient is on hydrochlorothiazide and lisinopril, I have advised patient follow salt diet, continue with current medications.  Screening - Plan: MM DIGITAL SCREENING BILATERAL  Special screening for malignant neoplasms, colon - Plan: Ambulatory referral to Gastroenterology   Health Maintenance -Colonoscopy: referral done  -Mammogram: ordered   Return in about 3 months (around 04/14/2014) for hypertension.  Lorayne Marek, MD

## 2014-01-13 ENCOUNTER — Encounter: Payer: Self-pay | Admitting: Gastroenterology

## 2014-01-13 ENCOUNTER — Telehealth: Payer: Self-pay | Admitting: Neurology

## 2014-01-13 DIAGNOSIS — G44209 Tension-type headache, unspecified, not intractable: Secondary | ICD-10-CM

## 2014-01-13 MED ORDER — DIVALPROEX SODIUM ER 500 MG PO TB24: 500.0000 mg | ORAL_TABLET | Freq: Two times a day (BID) | ORAL | Status: DC

## 2014-01-13 NOTE — Telephone Encounter (Signed)
Per previous phone note:  Garvin Fila, MD at 12/29/2013 4:24 PM     Status: Signed        I spoke to the patient who was tolerating Depakote well without side effects but no benefit hence advised her to increase it to twice daily and call back in 2 weeks. If still no benefit we'll discontinue and try something else   I have updated the Rx to BID and sent it to the pharmacy.  I called the patient back.  She is aware an updated Rx has been sent.  She would like to continue this dose and will call us back if anything further is needed.

## 2014-01-13 NOTE — Telephone Encounter (Signed)
Patient calling to follow up on Depakote refill request, states that her provider doubled her medication and now she is completely out. Please call and advise patient.

## 2014-01-16 ENCOUNTER — Telehealth: Payer: Self-pay | Admitting: Neurology

## 2014-01-16 NOTE — Telephone Encounter (Signed)
Patient called to state that since she started taking her Depakote, she has not noticed any changes. Patient is still having tingling in the face and pressure in her head and she had to leave her part time job. Please call patient and advise, she is wondering if she can be switched to a different medication.

## 2014-01-20 NOTE — Telephone Encounter (Signed)
Try increasing depakote to two tablets daily and call back in 1-2 weeks

## 2014-01-20 NOTE — Telephone Encounter (Signed)
Patient calling back to check for a response from fridays message. Please call

## 2014-01-20 NOTE — Telephone Encounter (Signed)
Pt is calling stating that since she started taking Depakote, she has not noticed any changes. Pt states that she is still having tingling in her face and pressure in her head and had to leave her job. Pt would like to know if there is another medication that she could try. Please advise

## 2014-01-21 NOTE — Telephone Encounter (Signed)
Called pt and pt stated that Depakote was increased to twice daily and that is why she calling back to inform Dr. Leonie Man that it is still not helping. Per phone note on 12/29/13, Dr. Leonie Man increased Depakote to twice daily and advised the pt to call back, if it is not helping and the doctor would discontinue Depakote and try something else. Please advise.

## 2014-01-21 NOTE — Telephone Encounter (Signed)
Dc depakote and start Topamax 50 mg hs x 1 week then twice daily

## 2014-01-22 ENCOUNTER — Other Ambulatory Visit: Payer: Self-pay | Admitting: Neurology

## 2014-01-22 MED ORDER — TOPIRAMATE 50 MG PO TABS: 50.0000 mg | ORAL_TABLET | Freq: Two times a day (BID) | ORAL | Status: DC

## 2014-01-22 NOTE — Telephone Encounter (Signed)
Called pt to inform her that per Dr. Leonie Man wanted her to stop taking depakote and start taking Topamax 50 mg hs x 1 week and then start taking it twice daily. I advised the pt to give it at 3 weeks and call back to let us know how she is doing on the medication. I also advised the pt that if she has any other problems, questions or concerns to call the office. Pt verbalized understanding.

## 2014-01-22 NOTE — Telephone Encounter (Signed)
Patient calling to state she has not heard anything yet, please call her back and advise.

## 2014-01-29 ENCOUNTER — Other Ambulatory Visit: Payer: Self-pay | Admitting: Internal Medicine

## 2014-01-29 ENCOUNTER — Ambulatory Visit (HOSPITAL_COMMUNITY)
Admission: RE | Admit: 2014-01-29 | Discharge: 2014-01-29 | Disposition: A | Discharge status: Home / Self Care | Payer: No Typology Code available for payment source | Source: Ambulatory Visit | Attending: Internal Medicine | Admitting: Internal Medicine

## 2014-01-29 DIAGNOSIS — Z1231 Encounter for screening mammogram for malignant neoplasm of breast: Secondary | ICD-10-CM

## 2014-01-29 DIAGNOSIS — Z139 Encounter for screening, unspecified: Secondary | ICD-10-CM

## 2014-02-10 ENCOUNTER — Telehealth: Payer: Self-pay | Admitting: Neurology

## 2014-02-10 NOTE — Telephone Encounter (Signed)
I called the patient back.  Relayed Dr Clydene Fake message.  She says she will try this and call us back if needed.

## 2014-02-10 NOTE — Telephone Encounter (Signed)
Pt called states she has the tried the topiramate (TOPAMAX) 50 MG tablet for a week 1 pill then tried the second week with 2 pills. Pt states this did not work and is getting worse. Pt would like for someone to call her back concerning this matter. Thanks

## 2014-02-10 NOTE — Telephone Encounter (Signed)
Increase the Topamax to 50 mg in the morning and 100 mg at night for 1 week and if not better increase to 100 mg twice daily and call back

## 2014-02-23 ENCOUNTER — Ambulatory Visit (AMBULATORY_SURGERY_CENTER): Payer: Self-pay | Admitting: *Deleted

## 2014-02-23 VITALS — Ht 68.0 in | Wt 253.2 lb

## 2014-02-23 DIAGNOSIS — Z1211 Encounter for screening for malignant neoplasm of colon: Secondary | ICD-10-CM

## 2014-02-23 MED ORDER — POLYETHYLENE GLYCOL 3350 17 GM/SCOOP PO POWD: 1.0000 | Freq: Once | ORAL | Status: DC

## 2014-02-23 NOTE — Progress Notes (Signed)
Patient denies any allergies to eggs or soy. Patient denies any problems with anesthesia. No oxygen use at home, no diet/wt loss pills per patient. EMMI education given to patient on colonoscopy. Patient cannot afford prep, Miralax/Ducolax OTC split dose instructions given to patient.

## 2014-03-06 ENCOUNTER — Ambulatory Visit (INDEPENDENT_AMBULATORY_CARE_PROVIDER_SITE_OTHER): Payer: Self-pay | Admitting: Nurse Practitioner

## 2014-03-06 ENCOUNTER — Encounter: Payer: Self-pay | Admitting: Nurse Practitioner

## 2014-03-06 VITALS — BP 121/79 | HR 70 | Ht 68.0 in | Wt 252.0 lb

## 2014-03-06 DIAGNOSIS — G44209 Tension-type headache, unspecified, not intractable: Secondary | ICD-10-CM

## 2014-03-06 MED ORDER — TIZANIDINE HCL 2 MG PO TABS: 2.0000 mg | ORAL_TABLET | Freq: Three times a day (TID) | ORAL | Status: DC

## 2014-03-06 MED ORDER — TRAMADOL HCL 50 MG PO TABS: 50.0000 mg | ORAL_TABLET | Freq: Four times a day (QID) | ORAL | Status: DC | PRN

## 2014-03-06 NOTE — Patient Instructions (Signed)
Start Tramadol 1-2 tablets every 6 hours as needed for acute headache.  Start Tizanidine 2mg  1/2 - 1 tablet every 8 hours as a preventive medication for headache, make cause sleepiness.  Follow up in 2-3 months, sooner as needed.

## 2014-03-06 NOTE — Progress Notes (Signed)
PATIENT: Alison Ward DOB: 1961-10-14  REASON FOR VISIT: follow up for tension headaches HISTORY FROM: patient  HISTORY OF PRESENT ILLNESS: HPI: 44 year African American lady seen for first office followup visit hospital admission 07/06/13 for intracerebral hemorrhage. She presented with headache and lethargy with elevated blood pressure of 202/98. CT scan of the head showed a 4. 1 x 2.1 cm right parietal temporal intracerebral hemorrhage with surrounding edema and intraventricular extension azygos 6 mm right-to-left midline shift. She was seen by neurosurgery and underwent emergent right ventriculostomy catheter placement Dr. Arnoldo Morale. She was kept in the intensive care unit and blood pressure was tightly controlled. Carotid Doppler showed nonobstructive stenosis. Stress echo showed normal ejection fraction. Ventriculostomy was subsequently clamped and removed. She had mild left-sided weakness which was improving. She was transferred to inpatient rehabilitation and did well and has been discharged home. She has had substantial improvement in her left-sided strength and is now able to walk independently. She has only mild diminished fine motor skills in the left hand. She states her blood pressure is well controlled than it is 133/86 in the office today. She started working part-time as a Counsellor and feels that her headaches seem to limiting her ability to work. She describes bitemporal pressure-like headaches off and on they're more prominent in the morning. She does not take any medications for this. She admits to diminished attention, concentration, disorganization as well as trouble with multitasking. She occasionally feels off balance when she is tired. She was actually discharged on Depakote for headache but she ran out of prescriptions a month ago.   UPDATE 03/06/14 (LL):  Since last visit, headaches were not improved on Depakote daily; she was changed to Topamax by Dr. Leonie Man.  She was  titrated to 150 mg daily, still with no relief, and side effects of cognitive difficulties, she weaned herself off.  Headaches are worst in the mornings, ease of by 10:30, but may return later in the day.  She takes 400 mg ibuprofen as needed for headache pain.  She describes sharp, carving pain at her temples when pain is the worst.  When pain is worst she feels numbness and tingling around her mouth.  Always feels pressure, like a tight band around the head or "swimmer's cap."  Never any visual symptoms or nausea.  Blood pressure has been under good control, she brings in her log, averaging 120-130's over 70's.  BP in office today is 121/79.  ROS:  14 system review of systems is positive for headache, pressure in head, tingling in face only and all other systems negative  ALLERGIES: No Known Allergies  HOME MEDICATIONS: Outpatient Prescriptions Prior to Visit  Medication Sig Dispense Refill  . hydrochlorothiazide (MICROZIDE) 12.5 MG capsule Take 2 capsules (25 mg total) by mouth daily.  60 capsule  3  . lisinopril (PRINIVIL,ZESTRIL) 20 MG tablet Take 1 tablet (20 mg total) by mouth daily.  60 tablet  3  . polyethylene glycol powder (GLYCOLAX/MIRALAX) powder Take 255 g (1 Container total) by mouth once.  238 g  0  . potassium chloride (K-DUR) 10 MEQ tablet Take 2 tablets (20 mEq total) by mouth daily.  30 tablet  5   No facility-administered medications prior to visit.   PHYSICAL EXAM  Filed Vitals:   03/06/14 1549  BP: 121/79  Pulse: 70  Height: 5\' 8"  (1.727 m)  Weight: 252 lb (114.306 kg)   Body mass index is 38.33 kg/(m^2).  Physical Exam  General:  middle aged [place] American lady , seated, in no evident distress  Head: head normocephalic and atraumatic. Orohparynx benign  Neck: supple with no carotid or supraclavicular bruits  Cardiovascular: regular rate and rhythm, no murmurs   Neurologic Exam  Mental Status: Awake and fully alert. Oriented to place and time. Recent and  remote memory intact. Attention span, concentration and fund of knowledge appropriate. Mood and affect appropriate.  Cranial Nerves:  Pupils equal, briskly reactive to light. Extraocular movements full without nystagmus. Visual fields full to confrontation. Hearing intact. Facial sensation intact. Face, tongue, palate moves normally and symmetrically.  Motor: Normal bulk and tone. Normal strength in all tested extremity muscles. Diminished fine finger movements on the left. Orbits right over left upper extremity.  Sensory.: intact to touch and pinprick and vibratory sensation.  Coordination: Rapid alternating movements normal in all extremities. Finger-to-nose and heel-to-shin performed accurately bilaterally.  Gait and Station: Arises from chair without difficulty. Stance is normal. Gait demonstrates normal stride length and balance . Able to heel, toe and tandem walk without difficulty.  Reflexes: 1+ and symmetric.   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 She is doing quite well except for frequent tension headaches. Depakote and Topamax were no benefit.   PLAN: I had a long discussion with the patient regarding her recent hospitalization frontal subdural hemorrhage, discussed the imaging findings, treatment and answered questions.   Start Tramadol 1-2 tablets every 6 hours as needed for acute headache. Start Tizanidine 2mg  1/2 - 1 tablet every 8 hours as a preventive medication for headache. Continue to maintain aggressive blood pressure control with goal below 130/90. Return for followup in 2-3 months, call earlier if necessary.  Meds ordered this encounter  Medications  . traMADol (ULTRAM) 50 MG tablet    Sig: Take 1-2 tablets (50-100 mg total) by mouth every 6 (six) hours as needed.    Dispense:  90 tablet    Refill:  5    Order Specific Question:   Supervising Provider    Answer:  Andrey Spearman R [[id number]]  . tiZANidine (ZANAFLEX) 2 MG tablet    Sig: Take 1 tablet (2 mg total) by mouth 3 (three) times daily.    Dispense:  30 tablet    Refill:  5    Order Specific Question:  Supervising Provider    Answer:  Andrey Spearman R [[id number]]   Philmore Pali, MSN, NP-C 03/06/2014, 4:49 PM [facility] [address], [place], [place] [address] [phone]  Note: This document was prepared with digital dictation and possible smart phrase technology. Any transcriptional errors that result from this process are unintentional.

## 2014-03-09 ENCOUNTER — Encounter: Payer: Self-pay | Admitting: Gastroenterology

## 2014-03-09 ENCOUNTER — Telehealth: Payer: Self-pay | Admitting: Nurse Practitioner

## 2014-03-09 ENCOUNTER — Ambulatory Visit (AMBULATORY_SURGERY_CENTER): Payer: Self-pay | Admitting: Gastroenterology

## 2014-03-09 VITALS — BP 102/67 | HR 60 | Temp 96.5°F | Resp 20 | Ht 68.0 in | Wt 253.0 lb

## 2014-03-09 DIAGNOSIS — D126 Benign neoplasm of colon, unspecified: Secondary | ICD-10-CM

## 2014-03-09 DIAGNOSIS — Z1211 Encounter for screening for malignant neoplasm of colon: Secondary | ICD-10-CM

## 2014-03-09 DIAGNOSIS — K648 Other hemorrhoids: Secondary | ICD-10-CM

## 2014-03-09 DIAGNOSIS — K5730 Diverticulosis of large intestine without perforation or abscess without bleeding: Secondary | ICD-10-CM

## 2014-03-09 MED ORDER — SODIUM CHLORIDE 0.9 % IV SOLN: 500.0000 mL | INTRAVENOUS | Status: DC

## 2014-03-09 NOTE — Progress Notes (Signed)
Called to room to assist during endoscopic procedure.  Patient ID and intended procedure confirmed with present staff. Received instructions for my participation in the procedure from the performing physician.  

## 2014-03-09 NOTE — Progress Notes (Signed)
Report to pacu rn, vss, bbs=clear 

## 2014-03-09 NOTE — Op Note (Addendum)
[facility]  [address]. [place], [address]   COLONOSCOPY PROCEDURE REPORT  PATIENT: Alison Ward, Alison Ward  MR#: [id number] BIRTHDATE: 02/25/1961 , 64  yrs. old GENDER: Female ENDOSCOPIST: Inda Castle, MD REFERRED BY: PROCEDURE DATE:  03/09/2014 PROCEDURE:   Colonoscopy with snare polypectomy First Screening Colonoscopy - Avg.  risk and is 50 yrs.  old or older Yes.  Prior Negative Screening - Now for repeat screening. N/A  History of Adenoma - Now for follow-up colonoscopy & has been > or = to 3 yrs.  N/A  Polyps Removed Today? Yes. ASA CLASS:   Class II INDICATIONS:average risk screening. MEDICATIONS: MAC sedation, administered by CRNA and propofol (Diprivan) 250mg  IV  DESCRIPTION OF PROCEDURE:   After the risks benefits and alternatives of the procedure were thoroughly explained, informed consent was obtained.  A digital rectal exam revealed no abnormalities of the rectum.   The LB CF-H180AL Loaner [id number] endoscope was introduced through the anus and advanced to the cecum, which was identified by both the appendix and ileocecal valve. No adverse events experienced.   The quality of the prep was excellent using Suprep  The instrument was then slowly withdrawn as the colon was fully examined.    COLON FINDINGS: A sessile polyp measuring 2-3 mm in size was found at the splenic flexure.  A polypectomy was performed with a cold snare.  The resection was complete and the polyp tissue was completely retrieved.   Mild diverticulosis was noted in the sigmoid colon.   The colon was otherwise normal.  There was no diverticulosis, inflammation, polyps or cancers unless previously stated.   Internal hemorrhoids were found.  Retroflexed views revealed no abnormalities. The time to cecum=5 minutes 55 seconds. Withdrawal time=10 minutes 40 seconds.  The scope was withdrawn and the procedure completed. COMPLICATIONS: There were no complications.  ENDOSCOPIC  IMPRESSION: 1.   Sessile polyp measuring 2-3 mm in size was found at the splenic flexure; polypectomy was performed with a cold snare 2.   Mild diverticulosis was noted in the sigmoid colon 3.   internal hemorrhoids 4.   The colon was otherwise normal  RECOMMENDATIONS: If the polyp(s) removed today are proven to be adenomatous (pre-cancerous) polyps, you will need a repeat colonoscopy in 5 years.  Otherwise you should continue to follow colorectal cancer screening guidelines for "routine risk" patients with colonoscopy in 10 years.  You will receive a letter within 1-2 weeks with the results of your biopsy as well as final recommendations.  Please call my office if you have not received a letter after 3 weeks.   eSigned:  Inda Castle, MD 03/09/2014 11:20 AM Revised: 03/09/2014 11:20 AM  cc: York Ram MD   PATIENT NAME:  Alison Ward, Alison Ward MR#: [id number]

## 2014-03-09 NOTE — Telephone Encounter (Signed)
Patient called to request that her medications that Charlott Holler gave to her on Friday  be called into the pharmacy at [facility] [place]. [phone].

## 2014-03-09 NOTE — Patient Instructions (Signed)
YOU HAD AN ENDOSCOPIC PROCEDURE TODAY AT THE [facility]: Refer to the procedure report that was given to you for any specific questions about what was found during the examination.  If the procedure report does not answer your questions, please call your gastroenterologist to clarify.  If you requested that your care partner not be given the details of your procedure findings, then the procedure report has been included in a sealed envelope for you to review at your convenience later.  YOU SHOULD EXPECT: Some feelings of bloating in the abdomen. Passage of more gas than usual.  Walking can help get rid of the air that was put into your GI tract during the procedure and reduce the bloating. If you had a lower endoscopy (such as a colonoscopy or flexible sigmoidoscopy) you may notice spotting of blood in your stool or on the toilet paper. If you underwent a bowel prep for your procedure, then you may not have a normal bowel movement for a few days.  DIET: Your first meal following the procedure should be a light meal and then it is ok to progress to your normal diet.  A half-sandwich or bowl of soup is an example of a good first meal.  Heavy or fried foods are harder to digest and may make you feel nauseous or bloated.  Likewise meals heavy in dairy and vegetables can cause extra gas to form and this can also increase the bloating.  Drink plenty of fluids but you should avoid alcoholic beverages for 24 hours.  ACTIVITY: Your care partner should take you home directly after the procedure.  You should plan to take it easy, moving slowly for the rest of the day.  You can resume normal activity the day after the procedure however you should NOT DRIVE or use heavy machinery for 24 hours (because of the sedation medicines used during the test).    SYMPTOMS TO REPORT IMMEDIATELY: A gastroenterologist can be reached at any hour.  During normal business hours, 8:30 AM to 5:00 PM Monday through Friday,  call [phone].  After hours and on weekends, please call the GI answering service at [phone] who will take a message and have the physician on call contact you.   Following lower endoscopy (colonoscopy or flexible sigmoidoscopy):  Excessive amounts of blood in the stool  Significant tenderness or worsening of abdominal pains  Swelling of the abdomen that is new, acute  Fever of 100F or higher  FOLLOW UP: If any biopsies were taken you will be contacted by phone or by letter within the next 1-3 weeks.  Call your gastroenterologist if you have not heard about the biopsies in 3 weeks.  Our staff will call the home number listed on your records the next business day following your procedure to check on you and address any questions or concerns that you may have at that time regarding the information given to you following your procedure. This is a courtesy call and so if there is no answer at the home number and we have not heard from you through the emergency physician on call, we will assume that you have returned to your regular daily activities without incident.  SIGNATURES/CONFIDENTIALITY: You and/or your care partner have signed paperwork which will be entered into your electronic medical record.  These signatures attest to the fact that that the information above on your After Visit Summary has been reviewed and is understood.  Full responsibility of the confidentiality of this   discharge information lies with you and/or your care-partner.  1 polyp removed and sent to pathology.  Mild diverticulosis and internal hemorrhoids.  Await pathology for final recommendations.

## 2014-03-09 NOTE — Telephone Encounter (Signed)
I called and spoke with the patient.  She said she has the written Rx's Alison Ward gave her and she will take them to the pharmacy to get filled.  She will call back if anything further is needed.

## 2014-03-10 ENCOUNTER — Telehealth: Payer: Self-pay

## 2014-03-10 NOTE — Telephone Encounter (Signed)
  Follow up Call-  Call back number 03/09/2014  Post procedure Call Back phone  # [phone]  Permission to leave phone message Yes     Patient questions:  Do you have a fever, pain , or abdominal swelling? no Pain Score  0 *  Have you tolerated food without any problems? yes  Have you been able to return to your normal activities? yes  Do you have any questions about your discharge instructions: Diet   no Medications  no Follow up visit  no  Do you have questions or concerns about your Care? no  Actions: * If pain score is 4 or above: No action needed, pain <4.

## 2014-03-13 ENCOUNTER — Encounter: Payer: Self-pay | Admitting: Gastroenterology

## 2014-04-15 ENCOUNTER — Other Ambulatory Visit: Payer: Self-pay | Admitting: Internal Medicine

## 2014-04-16 ENCOUNTER — Encounter: Payer: Self-pay | Admitting: Internal Medicine

## 2014-04-16 ENCOUNTER — Ambulatory Visit: Payer: No Typology Code available for payment source | Attending: Internal Medicine | Admitting: Internal Medicine

## 2014-04-16 VITALS — BP 138/92 | HR 75 | Temp 98.0°F | Resp 14 | Ht 68.0 in | Wt 256.0 lb

## 2014-04-16 DIAGNOSIS — I10 Essential (primary) hypertension: Secondary | ICD-10-CM

## 2014-04-16 DIAGNOSIS — E669 Obesity, unspecified: Secondary | ICD-10-CM

## 2014-04-16 DIAGNOSIS — R519 Headache, unspecified: Secondary | ICD-10-CM | POA: Insufficient documentation

## 2014-04-16 DIAGNOSIS — I639 Cerebral infarction, unspecified: Secondary | ICD-10-CM

## 2014-04-16 DIAGNOSIS — Z8673 Personal history of transient ischemic attack (TIA), and cerebral infarction without residual deficits: Secondary | ICD-10-CM | POA: Insufficient documentation

## 2014-04-16 DIAGNOSIS — I6350 Cerebral infarction due to unspecified occlusion or stenosis of unspecified cerebral artery: Secondary | ICD-10-CM

## 2014-04-16 DIAGNOSIS — Z6838 Body mass index (BMI) 38.0-38.9, adult: Secondary | ICD-10-CM | POA: Insufficient documentation

## 2014-04-16 DIAGNOSIS — R51 Headache: Secondary | ICD-10-CM

## 2014-04-16 MED ORDER — IBUPROFEN 800 MG PO TABS: 800.0000 mg | ORAL_TABLET | Freq: Three times a day (TID) | ORAL | Status: DC | PRN

## 2014-04-16 MED ORDER — LISINOPRIL-HYDROCHLOROTHIAZIDE 20-12.5 MG PO TABS: 1.0000 | ORAL_TABLET | Freq: Every day | ORAL | Status: DC

## 2014-04-16 MED ORDER — ACETAMINOPHEN-CODEINE #3 300-30 MG PO TABS: 1.0000 | ORAL_TABLET | Freq: Four times a day (QID) | ORAL | Status: DC | PRN

## 2014-04-16 NOTE — Progress Notes (Signed)
Pt is here following up on her HTN. Pt states that when she takes tramadol she gets really dizzy the next day and causes her to stay on the couch or in the bed.

## 2014-04-16 NOTE — Patient Instructions (Signed)
Exercise to Lose Weight Exercise and a healthy diet may help you lose weight. Your doctor may suggest specific exercises. EXERCISE IDEAS AND TIPS  Choose low-cost things you enjoy doing, such as walking, bicycling, or exercising to workout videos.  Take stairs instead of the elevator.  Walk during your lunch break.  Park your car further away from work or school.  Go to a gym or an exercise class.  Start with 5 to 10 minutes of exercise each day. Build up to 30 minutes of exercise 4 to 6 days a week.  Wear shoes with good support and comfortable clothes.  Stretch before and after working out.  Work out until you breathe harder and your heart beats faster.  Drink extra water when you exercise.  Do not do so much that you hurt yourself, feel dizzy, or get very short of breath. Exercises that burn about 150 calories:  Running 1  miles in 15 minutes.  Playing volleyball for 45 to 60 minutes.  Washing and waxing a car for 45 to 60 minutes.  Playing touch football for 45 minutes.  Walking 1  miles in 35 minutes.  Pushing a stroller 1  miles in 30 minutes.  Playing basketball for 30 minutes.  Raking leaves for 30 minutes.  Bicycling 5 miles in 30 minutes.  Walking 2 miles in 30 minutes.  Dancing for 30 minutes.  Shoveling snow for 15 minutes.  Swimming laps for 20 minutes.  Walking up stairs for 15 minutes.  Bicycling 4 miles in 15 minutes.  Gardening for 30 to 45 minutes.  Jumping rope for 15 minutes.  Washing windows or floors for 45 to 60 minutes. Document Released: 11/18/2010 Document Revised: 01/08/2012 Document Reviewed: 11/18/2010 [facility] Patient Information 2015 [place], [place]. This information is not intended to replace advice given to you by your health care provider. Make sure you discuss any questions you have with your health care provider. DASH Eating Plan DASH stands for "Dietary Approaches to Stop Hypertension." The DASH eating plan is a  healthy eating plan that has been shown to reduce high blood pressure (hypertension). Additional health benefits may include reducing the risk of type 2 diabetes mellitus, heart disease, and stroke. The DASH eating plan may also help with weight loss. WHAT DO I NEED TO KNOW ABOUT THE DASH EATING PLAN? For the DASH eating plan, you will follow these general guidelines:  Choose foods with a percent daily value for sodium of less than 5% (as listed on the food label).  Use salt-free seasonings or herbs instead of table salt or sea salt.  Check with your health care provider or pharmacist before using salt substitutes.  Eat lower-sodium products, often labeled as "lower sodium" or "no salt added."  Eat fresh foods.  Eat more vegetables, fruits, and low-fat dairy products.  Choose whole grains. Look for the word "whole" as the first word in the ingredient list.  Choose fish and skinless chicken or [place] more often than red meat. Limit fish, poultry, and meat to 6 oz (170 g) each day.  Limit sweets, desserts, sugars, and sugary drinks.  Choose heart-healthy fats.  Limit cheese to 1 oz (28 g) per day.  Eat more home-cooked food and less restaurant, buffet, and fast food.  Limit fried foods.  Cook foods using methods other than frying.  Limit canned vegetables. If you do use them, rinse them well to decrease the sodium.  When eating at a restaurant, ask that your food be prepared with  less salt, or no salt if possible. WHAT FOODS CAN I EAT? Seek help from a dietitian for individual calorie needs. Grains Whole grain or whole wheat bread. Brown rice. Whole grain or whole wheat pasta. Quinoa, bulgur, and whole grain cereals. Low-sodium cereals. Corn or whole wheat flour tortillas. Whole grain cornbread. Whole grain crackers. Low-sodium crackers. Vegetables Fresh or frozen vegetables (raw, steamed, roasted, or grilled). Low-sodium or reduced-sodium tomato and vegetable juices. Low-sodium  or reduced-sodium tomato sauce and paste. Low-sodium or reduced-sodium canned vegetables.  Fruits All fresh, canned (in natural juice), or frozen fruits. Meat and Other Protein Products Ground beef (85% or leaner), grass-fed beef, or beef trimmed of fat. Skinless chicken or [place]. Ground chicken or [place]. Pork trimmed of fat. All fish and seafood. Eggs. Dried beans, peas, or lentils. Unsalted nuts and seeds. Unsalted canned beans. Dairy Low-fat dairy products, such as skim or 1% milk, 2% or reduced-fat cheeses, low-fat ricotta or cottage cheese, or plain low-fat yogurt. Low-sodium or reduced-sodium cheeses. Fats and Oils Tub margarines without trans fats. Light or reduced-fat mayonnaise and salad dressings (reduced sodium). Avocado. Safflower, olive, or canola oils. Natural peanut or almond butter. Other Unsalted popcorn and pretzels. The items listed above may not be a complete list of recommended foods or beverages. Contact your dietitian for more options. WHAT FOODS ARE NOT RECOMMENDED? Grains White bread. White pasta. White rice. Refined cornbread. Bagels and croissants. Crackers that contain trans fat. Vegetables Creamed or fried vegetables. Vegetables in a cheese sauce. Regular canned vegetables. Regular canned tomato sauce and paste. Regular tomato and vegetable juices. Fruits Dried fruits. Canned fruit in light or heavy syrup. Fruit juice. Meat and Other Protein Products Fatty cuts of meat. Ribs, chicken wings, bacon, sausage, bologna, salami, chitterlings, fatback, hot dogs, bratwurst, and packaged luncheon meats. Salted nuts and seeds. Canned beans with salt. Dairy Whole or 2% milk, cream, half-and-half, and cream cheese. Whole-fat or sweetened yogurt. Full-fat cheeses or blue cheese. Nondairy creamers and whipped toppings. Processed cheese, cheese spreads, or cheese curds. Condiments Onion and garlic salt, seasoned salt, table salt, and sea salt. Canned and packaged gravies.  Worcestershire sauce. Tartar sauce. Barbecue sauce. Teriyaki sauce. Soy sauce, including reduced sodium. Steak sauce. Fish sauce. Oyster sauce. Cocktail sauce. Horseradish. Ketchup and mustard. Meat flavorings and tenderizers. Bouillon cubes. Hot sauce. Tabasco sauce. Marinades. Taco seasonings. Relishes. Fats and Oils Butter, stick margarine, lard, shortening, ghee, and bacon fat. Coconut, palm kernel, or palm oils. Regular salad dressings. Other Pickles and olives. Salted popcorn and pretzels. The items listed above may not be a complete list of foods and beverages to avoid. Contact your dietitian for more information. WHERE CAN I FIND MORE INFORMATION? National Heart, Lung, and Blood Institute: travelstabloid.com Document Released: 10/05/2011 Document Revised: 10/21/2013 Document Reviewed: 08/20/2013 [facility] Patient Information 2015 [place], [place]. This information is not intended to replace advice given to you by your health care provider. Make sure you discuss any questions you have with your health care provider.

## 2014-05-06 ENCOUNTER — Telehealth: Payer: Self-pay | Admitting: Nurse Practitioner

## 2014-05-06 NOTE — Telephone Encounter (Signed)
I recommend she see her PCP, may need to just stick to plain Tylenol or ibuprofen.  If vertigo continues after being off those medications, she should call back.

## 2014-05-06 NOTE — Telephone Encounter (Signed)
I called back and spoke with the patient.  She said when she took Tramadol, she developed severe vertigo.  She saw her PCP who advised her to d/c Tramadol and began her on Tylenol with Codeine as well as Ibuprofen.  Once she began these meds she became so dizzy she thought she was going to black out, and is no longer taking these meds.  She reports no other symptoms or allergic reactions from the meds.  Patient has not yet contacted her PCP, but will do so at this time.  She would like a message sent to Jeani Hawking to inform her of the situation and see if there is anything [employer]ani Hawking would like her to do.  Please advise.  Thank you.

## 2014-05-06 NOTE — Telephone Encounter (Signed)
Patient stated Tramadol is causing her to experience dizziness when lying down. She saw per PCP on 6/16 and took her off Tramadol and replaced it with Tylenol 3 and Ibuprofen 800 mg.  She stated dizziness had worsened to the point of losing consciousness.  Please call and advise.  Thanks

## 2014-05-06 NOTE — Telephone Encounter (Signed)
I called the patient back.  Relayed [name]'s message.  She verbalized understanding and will call us back if needed.

## 2014-05-27 ENCOUNTER — Telehealth: Payer: Self-pay | Admitting: Nurse Practitioner

## 2014-05-27 ENCOUNTER — Encounter (HOSPITAL_COMMUNITY): Payer: Self-pay | Admitting: Emergency Medicine

## 2014-05-27 ENCOUNTER — Emergency Department (HOSPITAL_COMMUNITY)
Admission: EM | Admit: 2014-05-27 | Discharge: 2014-05-27 | Disposition: A | Discharge status: Home / Self Care | Payer: No Typology Code available for payment source | Attending: Emergency Medicine | Admitting: Emergency Medicine

## 2014-05-27 ENCOUNTER — Emergency Department (HOSPITAL_COMMUNITY): Payer: No Typology Code available for payment source

## 2014-05-27 DIAGNOSIS — R519 Headache, unspecified: Secondary | ICD-10-CM

## 2014-05-27 DIAGNOSIS — I10 Essential (primary) hypertension: Secondary | ICD-10-CM | POA: Insufficient documentation

## 2014-05-27 DIAGNOSIS — R51 Headache: Secondary | ICD-10-CM | POA: Insufficient documentation

## 2014-05-27 DIAGNOSIS — Z79899 Other long term (current) drug therapy: Secondary | ICD-10-CM | POA: Insufficient documentation

## 2014-05-27 DIAGNOSIS — Z8673 Personal history of transient ischemic attack (TIA), and cerebral infarction without residual deficits: Secondary | ICD-10-CM | POA: Insufficient documentation

## 2014-05-27 DIAGNOSIS — G8929 Other chronic pain: Secondary | ICD-10-CM | POA: Insufficient documentation

## 2014-05-27 DIAGNOSIS — R011 Cardiac murmur, unspecified: Secondary | ICD-10-CM | POA: Insufficient documentation

## 2014-05-27 DIAGNOSIS — R209 Unspecified disturbances of skin sensation: Secondary | ICD-10-CM | POA: Insufficient documentation

## 2014-05-27 HISTORY — DX: Other chronic pain: G89.29

## 2014-05-27 HISTORY — DX: Headache: R51

## 2014-05-27 HISTORY — DX: Headache, unspecified: R51.9

## 2014-05-27 LAB — I-STAT CHEM 8, ED
BUN: 15 mg/dL (ref 6–23)
Calcium, Ion: 1.21 mmol/L (ref 1.12–1.23)
Chloride: 103 mEq/L (ref 96–112)
Creatinine, Ser: 1.1 mg/dL (ref 0.50–1.10)
Glucose, Bld: 88 mg/dL (ref 70–99)
HEMATOCRIT: 38 % (ref 36.0–46.0)
HEMOGLOBIN: 12.9 g/dL (ref 12.0–15.0)
Potassium: 3.7 mEq/L (ref 3.7–5.3)
Sodium: 139 mEq/L (ref 137–147)
TCO2: 26 mmol/L (ref 0–100)

## 2014-05-27 LAB — CBC
HEMATOCRIT: 36.3 % (ref 36.0–46.0)
Hemoglobin: 12 g/dL (ref 12.0–15.0)
MCH: 28.9 pg (ref 26.0–34.0)
MCHC: 33.1 g/dL (ref 30.0–36.0)
MCV: 87.5 fL (ref 78.0–100.0)
Platelets: 242 10*3/uL (ref 150–400)
RBC: 4.15 MIL/uL (ref 3.87–5.11)
RDW: 14.9 % (ref 11.5–15.5)
WBC: 4.1 10*3/uL (ref 4.0–10.5)

## 2014-05-27 MED ORDER — KETOROLAC TROMETHAMINE 30 MG/ML IJ SOLN
30.0000 mg | Freq: Once | INTRAMUSCULAR | Status: AC
  Administered 2014-05-27: 30 mg via INTRAVENOUS
  Filled 2014-05-27: qty 1

## 2014-05-27 MED ORDER — DIPHENHYDRAMINE HCL 50 MG/ML IJ SOLN
25.0000 mg | Freq: Once | INTRAMUSCULAR | Status: AC
  Administered 2014-05-27: 25 mg via INTRAVENOUS
  Filled 2014-05-27: qty 1

## 2014-05-27 MED ORDER — PROMETHAZINE HCL 25 MG PO TABS: 25.0000 mg | ORAL_TABLET | Freq: Four times a day (QID) | ORAL | Status: DC | PRN

## 2014-05-27 MED ORDER — SODIUM CHLORIDE 0.9 % IV SOLN
INTRAVENOUS | Status: DC
  Administered 2014-05-27: 12:00:00 via INTRAVENOUS

## 2014-05-27 MED ORDER — PROMETHAZINE HCL 25 MG/ML IJ SOLN
12.5000 mg | Freq: Once | INTRAMUSCULAR | Status: AC
  Administered 2014-05-27: 12.5 mg via INTRAVENOUS
  Filled 2014-05-27: qty 1

## 2014-05-27 NOTE — ED Notes (Signed)
Pt states that she had a stroke last September. In the past 5 days she has developed tingling in her face and a severe headache.

## 2014-05-27 NOTE — Discharge Instructions (Signed)
°Emergency Department Resource Guide °1) Find a Doctor and Pay Out of Pocket °Although you won't have to find out who is covered by your insurance plan, it is a good idea to ask around and get recommendations. You will then need to call the office and see if the doctor you have chosen will accept you as a new patient and what types of options they offer for patients who are self-pay. Some doctors offer discounts or will set up payment plans for their patients who do not have insurance, but you will need to ask so you aren't surprised when you get to your appointment. ° °2) Contact Your Local Health Department °Not all health departments have doctors that can see patients for sick visits, but many do, so it is worth a call to see if yours does. If you don't know where your local health department is, you can check in your phone book. The CDC also has a tool to help you locate your state's health department, and many state websites also have listings of all of their local health departments. ° °3) Find a Walk-in Clinic °If your illness is not likely to be very severe or complicated, you may want to try a walk in clinic. These are popping up all over the country in pharmacies, drugstores, and shopping centers. They're usually staffed by nurse practitioners or physician assistants that have been trained to treat common illnesses and complaints. They're usually fairly quick and inexpensive. However, if you have serious medical issues or chronic medical problems, these are probably not your best option. ° °No Primary Care Doctor: °- Call [facility] at  832-8000 - they can help you locate a primary care doctor that  accepts your insurance, provides certain services, etc. °- Physician Referral Service- 1-800-533-3463 ° °Chronic Pain Problems: °Organization         Address  Phone   Notes  °[facility]  ([phone]) 297-2271 Patients need to be referred by their primary care doctor.  ° °Medication  Assistance: °Organization         Address  Phone   Notes  °[place] [facility] [address] °[employer] [place], [place] [address] (336) 641-8030 --Must be a resident of [place] °-- Must have NO insurance coverage whatsoever (no Medicaid/ Medicare, etc.) °-- The pt. MUST have a primary care doctor that directs their care regularly and follows them in the community °  °[facility]  (866) 331-1348   °[employer]  (888) 892-1162   ° °Agencies that provide inexpensive medical care: °Organization         Address  Phone   Notes  °[facility]  (336) 832-8035   ° Internal Medicine    (336) 832-7272   °[facility] [address] °[place],  [address] (336) 832-4777   °[facility] [address], °[place] (336) 271-4999   °[facility]    (336) 373-0678   °[facility]    (336) 272-1050   °[facility] ° [address], [place] Phone:  (336) 832-4444, Fax:  (336) 832-4440 Hours of Operation:  9 am - 6 pm, M-F.  Also accepts Medicaid/Medicare and self-pay.  °[facility] ° [address], [place] Phone: (336) [phone], Fax: (336) [phone]. Hours of Operation:  8:30 am - 5:30 pm, M-F.  Also accepts Medicaid and self-pay.  °[facility] [place] [address], [place] Phone: (336) 878-6027   °[facility] [address], [place], [place] (336)723-1848, Ext. 123 Mondays & Thursdays: 7-9 AM.  First 15 patients are seen on a first come, first serve basis. °  ° °Medicaid-accepting [place] Providers: ° °Organization         Address  Phone   Notes  °[facility] [address], [place] (336) 641-2100 Also accepts self-pay patients.  °[facility] [address], [place] ° (336) 856-9996   °[facility] [address], [place]  (336) 288-8857   °[facility] [address] [place] [address], [place] (336) 299-7000   °Veita Bland [address], [place]  ° (336) 373-1557 Only accepts [place] Access Medicaid patients after they have their name applied to their card.  ° °Self-Pay (no insurance) in [place]: ° °Organization         Address  Phone   Notes  °Sickle Cell Patients, [facility] [address], [place] (336) 832-1970   °[facility] [address], [place] (336) 832-4400   °[facility] ° [address], [place] (336) 992-4800   °[facility]/Dr. Osei-Bonsu ° [address] [place] [address], [place] or [address], [place] (336) [phone] Phone number for both [place] and [place] locations is the same.  °[facility] [address], [place] (336) 299-0000   °[facility] [address] [place] [address], [place] or [address] (336) 852-7530 °(336) 878-2260   °[facility] [address], [place] (336) 350-1642, phone; (336) 294-5005, fax Sees patients 1st and 3rd Saturday of every month.  Must not qualify for public or private insurance (i.e. Medicaid, Medicare, [place] Health Choice, Veterans' Benefits) • Household income should be no more than 200% of the poverty level •The clinic cannot treat you if you are pregnant or think you are pregnant • Sexually transmitted diseases are not treated at the clinic.  ° ° °Dental Care: °Organization         Address  Phone  Notes  °[place] [facility] [address], [place] (336) 641-6152 Accepts children up to age 21 who are enrolled in Medicaid or [place] Health Choice; pregnant women with a Medicaid card; and children who have applied for Medicaid or [place] Health Choice, but were declined, whose parents can pay a reduced fee at time of service.  °[place]  [facility] [place]  [address], [place] (336) 641-7733 Accepts children up to age 21 who are enrolled in Medicaid or [place] Health Choice; pregnant women with a Medicaid card; and children who have applied for Medicaid or [place] Health Choice, but were declined, whose parents can pay a reduced fee at time of service.  °[facility] ° [address], [place] (336) 641-4533 Patients are seen by appointment only. Walk-ins are not accepted. [facility] will see patients 18 years of age and older. °Monday - Tuesday (8am-5pm) °Most Wednesdays (8:30-5pm) °$30 per visit, cash only  °[facility] ° [address], [place] (336) 641-4533 Patients are seen by appointment only. Walk-ins are not accepted. [facility] will see patients 18 years of age and older. °One   Wednesday Evening (Monthly: Volunteer Based).  $30 per visit, cash only  °[facility]  (919) [phone] for adults; Children under age 4, call Graduate Pediatric Dentistry at ([phone]) [phone]. Children aged 4-14, please call ([phone]) [phone] to request a pediatric application. ° Dental services are provided in all areas of dental care including fillings, crowns and bridges, complete and partial dentures, implants, gum treatment, root canals, and extractions. Preventive care is also provided. Treatment is provided to both adults and children. °Patients are selected via a lottery and there is often a waiting list. °  °[facility] [address], °[place] ° (336) 763-8833 [email or web address].drcivils.[email or web address] °  °[facility] [address], [place], [place] (336)723-1848, Ext. 123 Second and Fourth Thursday of each month, opens at 6:30 AM; Clinic ends at 9 AM.  Patients are seen on a first-come first-served basis, and a limited number are seen during each clinic.  ° °[facility] ° [address], [place], [place] (336) [phone]    Eligibility Requirements °You must have lived in [place], or [place] counties for at least the last three months. °  You cannot be eligible for state or federal sponsored healthcare insurance, including Veterans Administration, Medicaid, or Medicare. °  You generally cannot be eligible for healthcare insurance through your employer.  °  How to apply: °Eligibility screenings are held every Tuesday and Wednesday afternoon from 1:00 pm until 4:00 pm. You do not need an appointment for the interview!  °[facility] [address], [place], [place] 336-631-2330   °[facility]  336-342-8273   °[facility]  336-703-3100   °[facility]  336-570-6415   ° °Behavioral Health Resources in the Community: °Intensive Outpatient Programs °Organization         Address  Phone  Notes  °[facility] [address], [place], [place] 336-878-6098   °[facility] [address], [place], [place] 336-832-9800   °[facility] [address], [place], [place] ° 336-882-2125   °[facility] [address],  °[place], [place] 1-800-853-5163 or 336-641-4981   °Substance Abuse Resources °Organization         Address  Phone  Notes  °[facility]  336-882-2125   °[facility]  336-784-9470   °[facility]  336-285-9073   °[facility]  336-845-3988   °Residential & Outpatient Substance Abuse Program  1-800-659-3381   °Psychological Services °Organization         Address  Phone  Notes  °[facility]  336- 832-9600   °[facility]  336- 378-7881   °[facility] [address], [place] 1-800-853-5163 or 336-641-4981   ° °Mobile Crisis Teams °Organization         Address  Phone  Notes  °[facility], Mobile Crisis Care Unit  1-877-626-1772   °Assertive °[facility] ° [address]  [place], [place] 336-834-9664   °Sharon DeEsch [address] °[place] 336-554-5454   ° °Self-Help/Support Groups °Organization         Address  Phone             Notes  °[facility] - variety of support groups  336- 373-1402 Call for more information  °Narcotics Anonymous (NA), [facility] [address], °[place]  2 meetings at this location  ° °  Residential Treatment Programs Organization         Address  Phone  Notes  [facility] [address],    [place]  1-304-253-7194   [facility]  [address] [place] [address], [place] [address], [place], [place]   [place], [place] [phone] Admissions: 8am-3pm M-F  [facility] [place] [address],    [place], [place] 536-468-0321   [facility] [address] [place], [place], [place]   [facility] [address]  [place], [place]   [facility] Intensive Outpatient [address], [place], [place]   [facility] ([place].,  [place], [place] 1-763-868-2270 or 812-443-3004   [facility] [address] [place], [place] Accepts Medicaid  [facility] [address],  [place] 1-425-373-4243 Substance Abuse/Addiction Treatment   [facility]         Address  Phone  Notes  [facility]  437-077-9592   Domenic Schwab, PhD [address] [place], [place]   (541)096-5983 or 629-876-1907   [facility] [place], [place] (775) 828-7707   [facility] [address], [place], [place] 617-727-4485 Insurance/Medicaid/sponsorship through [facility] Families [address]                                    [place], [place] (608)[phone] [place] [address], [place] [phone]    Dr. Adele Schilder  [phone]   [facility] Dept. 1) [address], [place] 2) [place] 3)  [address], [place] [phone]  [phone]   [place] [phone] or [phone] (After Hours)       Take over the counter tylenol, ibuprofen and benadryl, as directed on packaging, with the prescription given to you today, as needed for headache. Call your regular medical doctor and your Neurologist today to schedule a follow up appointment within the next 3  days.  Return to the Emergency Department immediately sooner if worsening.

## 2014-05-27 NOTE — ED Notes (Signed)
Patient transported to CT 

## 2014-05-27 NOTE — ED Provider Notes (Signed)
CSN: [id number]     Arrival date & time 05/27/14  1025 History   First MD Initiated Contact with Patient 05/27/14 1052     Chief Complaint  Patient presents with  . Tingling  . Headache      HPI Pt was seen at 1055. Per pt, c/o gradual onset and persistence of constant acute flair of her chronic headache for the past 4 to 5 days.  Describes the headache as per her usual chronic headache pain pattern for several years. Pt describes the headaches as located bitemporal, "sharp" and "pressure." Headaches occur daily, worse in the mornings, then wax and wane throughout the day. Has been associated with numbness travelling down her face and around her mouth. Pt alternates tylenol and motrin with partial relief. Pt has been evaluated by her PMD and Neuro MD for same, and "was prescribed a lot of medicines I don't take anymore because they didn't work." Denies any change in her usual chronic headache symptom pattern.  Denies headache was sudden or maximal in onset or at any time.  Denies visual changes, no focal motor weakness, no tingling/numbness in extremities, no fevers, no neck pain, no rash.      Past Medical History  Diagnosis Date  . Hypertension   . Heart murmur   . Stroke 07/05/2013  . Chronic headaches     daily, bitemporal, associated with tingling in face   Past Surgical History  Procedure Laterality Date  . Ventriculostomy Left 07/07/2013    Procedure: VENTRICULOSTOMY;  Surgeon: Ophelia Charter, MD;  Location: [facility];  Service: Neurosurgery;  Laterality: Left;  Left Ventriculostomy and Removal of Right Vetriculostomy   Family History  Problem Relation Age of Onset  . Hypertension Mother   . Hypertension Father   . Diabetes Daughter   . Colon cancer Neg Hx    History  Substance Use Topics  . Smoking status: Never Smoker   . Smokeless tobacco: Never Used  . Alcohol Use: No    Review of Systems ROS: Statement: All systems negative except as marked or noted in the  HPI; Constitutional: Negative for fever and chills. ; ; Eyes: Negative for eye pain, redness and discharge. ; ; ENMT: Negative for ear pain, hoarseness, nasal congestion, sinus pressure and sore throat. ; ; Cardiovascular: Negative for chest pain, palpitations, diaphoresis, dyspnea and peripheral edema. ; ; Respiratory: Negative for cough, wheezing and stridor. ; ; Gastrointestinal: Negative for nausea, vomiting, diarrhea, abdominal pain, blood in stool, hematemesis, jaundice and rectal bleeding. . ; ; Genitourinary: Negative for dysuria, flank pain and hematuria. ; ; Musculoskeletal: Negative for back pain and neck pain. Negative for swelling and trauma.; ; Skin: Negative for pruritus, rash, abrasions, blisters, bruising and skin lesion.; ; Neuro: +headache, paresthesias. Negative for lightheadedness and neck stiffness. Negative for weakness, altered level of consciousness , altered mental status, extremity weakness, involuntary movement, seizure and syncope.      Allergies  Review of patient's allergies indicates no known allergies.  Home Medications   Prior to Admission medications   Medication Sig Start Date End Date Taking? Authorizing Provider  acetaminophen (TYLENOL) 325 MG tablet Take 650 mg by mouth every 6 (six) hours as needed for mild pain.   Yes Historical Provider, MD  ibuprofen (ADVIL,MOTRIN) 800 MG tablet Take 800 mg by mouth every 8 (eight) hours as needed for mild pain. 04/16/14  Yes Angelica Chessman, MD  lisinopril-hydrochlorothiazide (ZESTORETIC) 20-12.5 MG per tablet Take 1 tablet by mouth daily. 04/16/14  Yes Angelica Chessman, MD  potassium chloride (K-DUR) 10 MEQ tablet Take 10 mEq by mouth daily. 10/17/13  Yes Meredith Staggers, MD  tiZANidine (ZANAFLEX) 2 MG tablet Take 1 tablet (2 mg total) by mouth 3 (three) times daily. 03/06/14  Yes Philmore Pali, NP   BP 158/99  Pulse 68  Temp(Src) 97.9 F (36.6 C) (Oral)  Resp 16  SpO2 96%  LMP 11/24/2011  11:29 Orthostatic Vital  Signs Orthostatic Lying - BP- Lying: 138/86 mmHg ; Pulse- Lying: 58  Orthostatic Sitting - BP- Sitting: 137/90 mmHg ; Pulse- Sitting: 70  Orthostatic Standing at 0 minutes - BP- Standing at 0 minutes: 145/92 mmHg ; Pulse- Standing at 0 minutes: 65    Physical Exam 1100: Physical examination:  Nursing notes reviewed; Vital signs and O2 SAT reviewed;  Constitutional: Well developed, Well nourished, Well hydrated, In no acute distress; Head:  Normocephalic, atraumatic; Eyes: EOMI, PERRL, No scleral icterus; ENMT: Mouth and pharynx normal, Mucous membranes moist; Neck: Supple, Full range of motion, No lymphadenopathy; Cardiovascular: Regular rate and rhythm, No gallop; Respiratory: Breath sounds clear & equal bilaterally, No wheezes.  Speaking full sentences with ease, Normal respiratory effort/excursion; Chest: Nontender, Movement normal; Abdomen: Soft, Nontender, Nondistended, Normal bowel sounds; Genitourinary: No CVA tenderness; Extremities: Pulses normal, No tenderness, No edema, No calf edema or asymmetry.; Neuro: AA&Ox3, Major CN grossly intact. Speech clear.  No facial droop.  No nystagmus. Grips equal. Strength 5/5 equal bilat UE's and LE's.  DTR 2/4 equal bilat UE's and LE's.  No gross sensory deficits.  Normal cerebellar testing bilat UE's (finger-nose) and LE's (heel-shin)..; Skin: Color normal, Warm, Dry.   ED Course  Procedures    1110:  EPIC chart reviewed: pt's headache c/w her usual symptom pattern which is described in Neuro office note 03/06/2014.       EKG Interpretation   Date/Time:  Wednesday May 27 2014 10:50:02 EDT Ventricular Rate:  66 PR Interval:  156 QRS Duration: 93 QT Interval:  419 QTC Calculation: 439 R Axis:   3 Text Interpretation:  Sinus rhythm Nonspecific T wave abnormality Baseline  wander Artifact When compared with ECG of 07/06/2013 T wave abnormality  Lateral leads is no longer Present Confirmed by [facility]  MD, Nunzio Cory  [phone]) on 05/27/2014 11:20:30  AM      MDM  MDM Reviewed: previous chart, nursing note and vitals Reviewed previous: labs, ECG and CT scan Interpretation: labs, ECG and CT scan    Results for orders placed during the hospital encounter of 05/27/14  CBC      Result Value Ref Range   WBC 4.1  4.0 - 10.5 K/uL   RBC 4.15  3.87 - 5.11 MIL/uL   Hemoglobin 12.0  12.0 - 15.0 g/dL   HCT 36.3  36.0 - 46.0 %   MCV 87.5  78.0 - 100.0 fL   MCH 28.9  26.0 - 34.0 pg   MCHC 33.1  30.0 - 36.0 g/dL   RDW 14.9  11.5 - 15.5 %   Platelets 242  150 - 400 K/uL  I-STAT CHEM 8, ED      Result Value Ref Range   Sodium 139  137 - 147 mEq/L   Potassium 3.7  3.7 - 5.3 mEq/L   Chloride 103  96 - 112 mEq/L   BUN 15  6 - 23 mg/dL   Creatinine, Ser 1.10  0.50 - 1.10 mg/dL   Glucose, Bld 88  70 - 99 mg/dL   Calcium,  Ion 1.21  1.12 - 1.23 mmol/L   TCO2 26  0 - 100 mmol/L   Hemoglobin 12.9  12.0 - 15.0 g/dL   HCT 38.0  36.0 - 46.0 %   Ct Head Wo Contrast 05/27/2014   CLINICAL DATA:  Tingling of the left side of the face and headache. History of previous stroke.  EXAM: CT HEAD WITHOUT CONTRAST  TECHNIQUE: Contiguous axial images were obtained from the base of the skull through the vertex without intravenous contrast.  COMPARISON:  11/17/2013  FINDINGS: There is old infarction in the right posterior temporal and parietal region which has progressed to atrophy encephalomalacia. This is unchanged from the previous study. There is an old right frontal cortical and subcortical infarction, image 22, which is unchanged. No sign of acute infarction, mass lesion, hemorrhage, hydrocephalus or extra-axial collection. The calvarium is unremarkable. Sinuses, middle ears and mastoids are clear. There is atherosclerotic calcification of the major vessels at the base of the brain.  IMPRESSION: Old cortical and subcortical infarctions on the right, unchanged since January. No acute finding by CT.   Electronically Signed   By: Nelson Chimes M.D.   On: 05/27/2014  11:39    1345:  Pt states she feels "much better now" and wants to go home. Not orthostatic on VS. Neuro exam remains intact and unchanged. Headache per pt's usual chronic pain pattern, no red flags today on H&P. Dx and testing d/w pt and family.  Questions answered.  Verb understanding, agreeable to d/c home with outpt f/u.   Alfonzo Feller, DO 05/30/14 [phone]

## 2014-05-27 NOTE — ED Notes (Signed)
Pt also states that she intermittently gets dizzy and lightheaded. This morning pt states she was sitting still this morning when it came on.

## 2014-05-27 NOTE — Telephone Encounter (Signed)
Patient calling to state that since yesterday she had intense pain in the head which went down both sides of her face, please return call to patient and advise.

## 2014-05-27 NOTE — Telephone Encounter (Signed)
I spoke with Alison Ward regarding her HA and advised her to go to the ED.  Alison Ward indicated understanding and agreed with the plan of care.  She stated that she received this same advice from her PCP as well.  I questioned her ability to drive and she said that her daughter can take her to the ED.

## 2014-05-28 ENCOUNTER — Telehealth: Payer: Self-pay | Admitting: Nurse Practitioner

## 2014-05-28 NOTE — Telephone Encounter (Signed)
She should not take the Phenergen if she is not nauseous, and dizziness is a side effect.  You can move her up to next week if she wants.

## 2014-05-28 NOTE — Telephone Encounter (Signed)
Called pt and she stated that the medication that she is talking about is Phenergan 25 mg and that she just wanted to know if she should take this, if she is not experiencing nausea or vomiting. Pt also wanted to know if she could move her appt up. Pt appt is on 06/10/14. Pt stated that the hospital wanted her to be seen sooner. Please advise

## 2014-05-28 NOTE — Telephone Encounter (Signed)
Patient calling to give an update on how she's doing, states he went to the ER as advised yesterday and feels better today. They gave her medication for nausea but patient states she does not have nausea, she is just slightly dizzy. Patient wants to know if she has to take this medication. Please call and advise.

## 2014-05-28 NOTE — Telephone Encounter (Signed)
Called pt to inform her per Jeani Hawking, NP for pt not to take the Phenergen if pt is not nauseous and dizziness is a side effect. I also made an appt per Jeani Hawking, NP on 06/01/14 with Jeani Hawking, NP. I advised the pt the if she has any other problems, questions or concerns to call the office. Pt verbalized understanding.

## 2014-06-01 ENCOUNTER — Ambulatory Visit (INDEPENDENT_AMBULATORY_CARE_PROVIDER_SITE_OTHER): Payer: Self-pay | Admitting: Nurse Practitioner

## 2014-06-01 ENCOUNTER — Encounter: Payer: Self-pay | Admitting: Nurse Practitioner

## 2014-06-01 VITALS — BP 184/101 | HR 68 | Ht 68.0 in | Wt 256.0 lb

## 2014-06-01 DIAGNOSIS — G44221 Chronic tension-type headache, intractable: Secondary | ICD-10-CM

## 2014-06-01 DIAGNOSIS — R6884 Jaw pain: Secondary | ICD-10-CM

## 2014-06-01 DIAGNOSIS — G250 Essential tremor: Secondary | ICD-10-CM

## 2014-06-01 DIAGNOSIS — G252 Other specified forms of tremor: Secondary | ICD-10-CM

## 2014-06-01 DIAGNOSIS — G44229 Chronic tension-type headache, not intractable: Secondary | ICD-10-CM

## 2014-06-01 DIAGNOSIS — G44209 Tension-type headache, unspecified, not intractable: Secondary | ICD-10-CM

## 2014-06-01 MED ORDER — AMITRIPTYLINE HCL 10 MG PO TABS: 10.0000 mg | ORAL_TABLET | Freq: Every day | ORAL | Status: DC

## 2014-06-01 MED ORDER — PROPRANOLOL HCL 20 MG PO TABS: 20.0000 mg | ORAL_TABLET | Freq: Two times a day (BID) | ORAL | Status: DC

## 2014-06-01 NOTE — Patient Instructions (Addendum)
Check labs today for inflammatory markers, Sed rate and CRP.  Start Amitriptyline 10 mg at bedtime. Elavil (generic name: amitriptyline) for headache prevention. Take a pill daily at bedtime for 2 weeks,  then 2 pills daily at bedtime for 2 weeks,  then 3 pills daily at bedtime for 2 weeks,  then 4 pills daily at bedtime thereafter.  Common side effects reported are: mouth dryness, drowsiness, confusion. It may take 4-6 weeks to see response from this medication.  Start Propranolol 20 mg twice daily for tremor.  Follow up in 8 weeks, sooner as needed.

## 2014-06-01 NOTE — Progress Notes (Signed)
PATIENT: Alison Ward DOB: 11-11-1960  REASON FOR VISIT: sooner follow up for headache HISTORY FROM: patient  HISTORY OF PRESENT ILLNESS: 18 year African American lady seen for first office followup visit hospital admission 07/06/13 for intracerebral hemorrhage. She presented with headache and lethargy with elevated blood pressure of 202/98. CT scan of the head showed a 4. 1 x 2.1 cm right parietal temporal intracerebral hemorrhage with surrounding edema and intraventricular extension azygos 6 mm right-to-left midline shift. She was seen by neurosurgery and underwent emergent right ventriculostomy catheter placement Dr. Arnoldo Morale. She was kept in the intensive care unit and blood pressure was tightly controlled. Carotid Doppler showed nonobstructive stenosis. Stress echo showed normal ejection fraction. Ventriculostomy was subsequently clamped and removed. She had mild left-sided weakness which was improving. She was transferred to inpatient rehabilitation and did well and has been discharged home. She has had substantial improvement in her left-sided strength and is now able to walk independently. She has only mild diminished fine motor skills in the left hand. She states her blood pressure is well controlled than it is 133/86 in the office today. She started working part-time as a Counsellor and feels that her headaches seem to limiting her ability to work. She describes bitemporal pressure-like headaches off and on they're more prominent in the morning. She does not take any medications for this. She admits to diminished attention, concentration, disorganization as well as trouble with multitasking. She occasionally feels off balance when she is tired. She was actually discharged on Depakote for headache but she ran out of prescriptions a month ago.   UPDATE 03/06/14 (LL):  Since last visit, headaches were not improved on Depakote daily; she was changed to Topamax by Dr. Leonie Man.  She was titrated to  150 mg daily, still with no relief, and side effects of cognitive difficulties, she weaned herself off.  Headaches are worst in the mornings, ease of by 10:30, but may return later in the day.  She takes 400 mg ibuprofen as needed for headache pain.  She describes sharp, carving pain at her temples when pain is the worst.  When pain is worst she feels numbness and tingling around her mouth.  Always feels pressure, like a tight band around the head or "swimmer's cap."  Never any visual symptoms or nausea.  Blood pressure has been under good control, she brings in her log, averaging 120-130's over 70's.  BP in office today is 121/79.  UPDATE 06/01/14 (LL): Patient requested sooner followup for headaches. Tramadol prescribed at last visit gave her vertigo, Tizanidine did not help. She went to the ER on 7/29 with severe headache, CT was negative for anything acute.  She was given a liter of NS, Toradol, Benadryl, and Phenergan which provided relief.  Her headache has seemed to change from feeling like a "swimmer's cap" to now having bilateral sharp temporal pains, which progresses to bilateral cheek numbness and jaw pain.  Pain is only a 2/10 today, yesterday she states was between 5/10 and 7/10 all day.  She is also bothered by an action tremor in her left hand, which is embarrassing to her in social situations. She states it is worse with holding heavier objects.  ROS:  14 system review of systems is positive for headache, pressure in head, tingling in face only and all other systems negative  ALLERGIES: No Known Allergies  HOME MEDICATIONS: Outpatient Prescriptions Prior to Visit  Medication Sig Dispense Refill  . acetaminophen (TYLENOL) 325 MG tablet  Take 650 mg by mouth every 6 (six) hours as needed for mild pain.      Marland Kitchen ibuprofen (ADVIL,MOTRIN) 800 MG tablet Take 800 mg by mouth every 8 (eight) hours as needed for mild pain.      Marland Kitchen lisinopril-hydrochlorothiazide (ZESTORETIC) 20-12.5 MG per tablet Take  1 tablet by mouth daily.  90 tablet  3  . potassium chloride (K-DUR) 10 MEQ tablet Take 10 mEq by mouth daily.      Marland Kitchen tiZANidine (ZANAFLEX) 2 MG tablet Take 1 tablet (2 mg total) by mouth 3 (three) times daily.  30 tablet  5  . promethazine (PHENERGAN) 25 MG tablet Take 1 tablet (25 mg total) by mouth every 6 (six) hours as needed for nausea or vomiting (or headache).  8 tablet  0   No facility-administered medications prior to visit.    PHYSICAL EXAM Filed Vitals:   06/01/14 1513  BP: 184/101  Pulse: 68  Height: 5\' 8"  (1.727 m)  Weight: 256 lb (116.121 kg)   Body mass index is 38.93 kg/(m^2).  Physical Exam  General: middle aged African American lady , seated, in no evident distress  Head: head normocephalic and atraumatic. Orohparynx benign  Neck: supple with no carotid or supraclavicular bruits  Cardiovascular: regular rate and rhythm, no murmurs   Neurologic Exam  Mental Status: Awake and fully alert. Oriented to place and time. Recent and remote memory intact. Attention span, concentration and fund of knowledge appropriate. Mood and affect appropriate.  Cranial Nerves:  Pupils equal, briskly reactive to light. Extraocular movements full without nystagmus. Visual fields full to confrontation. Hearing intact. Facial sensation intact. Face, tongue, palate moves normally and symmetrically.  Motor: Normal bulk and tone. Normal strength in all tested extremity muscles. Diminished fine finger movements on the left. Orbits right over left upper extremity.  Mild action tremor noted on left. Sensory: intact to touch and pinprick and vibratory sensation.  Coordination: Rapid alternating movements normal in all extremities. Finger-to-nose and heel-to-shin performed accurately bilaterally.  Gait and Station: Arises from chair without difficulty. Stance is normal. Gait demonstrates normal stride length and balance . Able to heel, toe and tandem walk without difficulty.  Reflexes: 1+ and  symmetric.   DIAGNOSTIC DATA (LABS, IMAGING, TESTING) - I reviewed patient records, labs, notes, testing and imaging myself where available.  Lab Results  Component Value Date   WBC 4.1 05/27/2014   HGB 12.9 05/27/2014   HCT 38.0 05/27/2014   MCV 87.5 05/27/2014   PLT 242 05/27/2014      Component Value Date/Time   NA 139 05/27/2014 1109   K 3.7 05/27/2014 1109   CL 103 05/27/2014 1109   CO2 30 09/19/2013 1304   GLUCOSE 88 05/27/2014 1109   BUN 15 05/27/2014 1109   CREATININE 1.10 05/27/2014 1109   CREATININE 0.93 09/19/2013 1304   CALCIUM 9.4 09/19/2013 1304   PROT 7.3 09/19/2013 1304   ALBUMIN 4.0 09/19/2013 1304   AST 13 09/19/2013 1304   ALT 8 09/19/2013 1304   ALKPHOS 51 09/19/2013 1304   BILITOT 1.3* 09/19/2013 1304   GFRNONAA 70 09/19/2013 1304   GFRNONAA 56* 07/28/2013 0535   GFRAA 81 09/19/2013 1304   GFRAA 65* 07/28/2013 0535    ASSESSMENT: ASSESSMENT AND PLAN 2 year African American lady with a right temporoparietal intracerebral hemorrhage status post ventriculostomy due to the hypertension in September 2014. She is doing quite well except for frequent headaches. Depakote, Topamax, Tizanidine were no benefit. Tramadol gave her  vertigo.  PLAN: I had a long discussion with the patient regarding her recent ER visit for different type of headache than she had been experiencing, discussed the imaging findings, treatment and answered questions.  Check labs today for inflammatory markers, Sed rate and CRP. Start Amitriptyline 10 mg at bedtime for headache prevention. Take a pill daily at bedtime for 2 weeks,  then 2 pills daily at bedtime for 2 weeks,  then 3 pills daily at bedtime for 2 weeks,  then 4 pills daily at bedtime thereafter.  Start Propranolol 20 mg twice daily for tremor. Continue to maintain aggressive blood pressure control with goal below 130/90. \ Return for followup in 2 months, call earlier if necessary.  Meds ordered this encounter  Medications  .  amitriptyline (ELAVIL) 10 MG tablet    Sig: Take 1 tablet (10 mg total) by mouth at bedtime. Increase dose by 1 tablets every 2 weeks.    Dispense:  120 tablet    Refill:  1    Order Specific Question:  Supervising Provider    Answer:    . propranolol (INDERAL) 20 MG tablet    Sig: Take 1 tablet (20 mg total) by mouth 2 (two) times daily.    Dispense:  60 tablet    Refill:  2    Order Specific Question:  Supervising Provider    Answer:  Antony Contras [[id number]]   Return in about 2 months (around 08/01/2014).  Rudi Rummage Nghia Mcentee, MSN, FNP-BC, A/GNP-C 06/01/2014, 5:24 PM [facility] [address], [place], [place] [address] [phone]  Note: This document was prepared with digital dictation and possible smart phrase technology. Any transcriptional errors that result from this process are unintentional.

## 2014-06-02 LAB — SEDIMENTATION RATE: Sed Rate: 12 mm/hr (ref 0–40)

## 2014-06-02 LAB — C-REACTIVE PROTEIN: CRP: 2.9 mg/L (ref 0.0–4.9)

## 2014-06-04 ENCOUNTER — Ambulatory Visit: Payer: No Typology Code available for payment source | Attending: Internal Medicine | Admitting: Internal Medicine

## 2014-06-04 ENCOUNTER — Encounter: Payer: Self-pay | Admitting: Internal Medicine

## 2014-06-04 VITALS — BP 166/85 | HR 66 | Temp 98.3°F | Resp 16 | Ht 68.0 in | Wt 254.0 lb

## 2014-06-04 DIAGNOSIS — I10 Essential (primary) hypertension: Secondary | ICD-10-CM

## 2014-06-04 DIAGNOSIS — R51 Headache: Secondary | ICD-10-CM | POA: Insufficient documentation

## 2014-06-04 DIAGNOSIS — R011 Cardiac murmur, unspecified: Secondary | ICD-10-CM | POA: Insufficient documentation

## 2014-06-04 MED ORDER — LISINOPRIL-HYDROCHLOROTHIAZIDE 20-25 MG PO TABS: 1.0000 | ORAL_TABLET | Freq: Every day | ORAL | Status: DC

## 2014-06-04 NOTE — Progress Notes (Signed)
Patient ID: [name], female   DOB: [birth year], 53 y.o.   MRN: [id number]   Alison Ward, is a 53 y.o. female  VVO:[id number]  YIR:[id number]  DOB - August 05, 1961  Chief Complaint  Patient presents with  . Follow-up  . Hypertension  . Headache        Subjective:   Alison Ward is a 53 y.o. female here today for a follow up visit. Pt here to f/u frequent headaches with stroke workup @ [facility] ER. Pt describes pain as sharp, achy pain in frontal head area radiating to both sides of face with left side drifting. Pt was seen in ER with negative CT scan and blood work. Pt states her blood pressure was elevated in ER 180/90's. She was given a liter of NS, Toradol, Benadryl, and Phenergan which provided relief. Her headache has seemed to change from feeling like a "swimmer's cap" to now having bilateral sharp temporal pains, which progresses to bilateral cheek numbness and jaw pain New medications added Propranolol 20 mg tab, Elavil 10 mg tab to regimen with positive effect. C/o continued headaches with weakness to left side. Rates pain 3/10 without n/v or chest pain. Patient follows up with neurologist, FNP. Lam, the most recent visit was 3 days ago. Patient claims to be feeling better with her current medications. Patient has No headache, No chest pain, No abdominal pain - No Nausea, No new weakness tingling or numbness, No Cough - SOB.  No problems updated.  ALLERGIES: No Known Allergies  PAST MEDICAL HISTORY: Past Medical History  Diagnosis Date  . Hypertension   . Heart murmur   . Stroke 07/05/2013  . Chronic headaches     daily, bitemporal, associated with tingling in face    MEDICATIONS AT HOME: Prior to Admission medications   Medication Sig Start Date End Date Taking? Authorizing Provider  acetaminophen (TYLENOL) 325 MG tablet Take 650 mg by mouth every 6 (six) hours as needed for mild pain.   Yes Historical Provider, MD  amitriptyline (ELAVIL) 10 MG tablet Take 1 tablet  (10 mg total) by mouth at bedtime. Increase dose by 1 tablets every 2 weeks. 06/01/14  Yes Philmore Pali, NP  ibuprofen (ADVIL,MOTRIN) 800 MG tablet Take 800 mg by mouth every 8 (eight) hours as needed for mild pain. 04/16/14  Yes Angelica Chessman, MD  potassium chloride (K-DUR) 10 MEQ tablet Take 10 mEq by mouth daily. 10/17/13  Yes Meredith Staggers, MD  propranolol (INDERAL) 20 MG tablet Take 1 tablet (20 mg total) by mouth 2 (two) times daily. 06/01/14  Yes Philmore Pali, NP  tiZANidine (ZANAFLEX) 2 MG tablet Take 1 tablet (2 mg total) by mouth 3 (three) times daily. 03/06/14  Yes Philmore Pali, NP  lisinopril-hydrochlorothiazide (PRINZIDE,ZESTORETIC) 20-25 MG per tablet Take 1 tablet by mouth daily. 06/04/14   Angelica Chessman, MD  promethazine (PHENERGAN) 25 MG tablet Take 1 tablet (25 mg total) by mouth every 6 (six) hours as needed for nausea or vomiting (or headache). 05/27/14   Francine Graven, DO     Objective:   Filed Vitals:   06/04/14 1446  BP: 166/85  Pulse: 66  Temp: 98.3 F (36.8 C)  TempSrc: Oral  Resp: 16  Height: 5\' 8"  (1.727 m)  Weight: 254 lb (115.214 kg)  SpO2: 100%    Exam General appearance : Awake, alert, not in any distress. Speech Clear. Not toxic looking HEENT: Atraumatic and Normocephalic, pupils equally reactive to light and accomodation Neck: supple,  no JVD. No cervical lymphadenopathy.  Chest:Good air entry bilaterally, no added sounds  CVS: S1 S2 regular, no murmurs.  Abdomen: Bowel sounds present, Non tender and not distended with no gaurding, rigidity or rebound. Extremities: B/L Lower Ext shows no edema, both legs are warm to touch Neurology: Awake alert, and oriented X 3, CN II-XII intact, Non focal Skin:No Rash Wounds:N/A  Data Review Lab Results  Component Value Date   HGBA1C 6.0* 07/06/2013     Assessment & Plan   1. Essential hypertension, benign Blood pressure today was 160/85 We have discussed blood pressure goal of 130/90 maximum Patient was  counseled extensively about nutrition and exercise DASH diet was recommended To continue current regimen to give time for the new additions take effect on the blood pressure Patient verbalized understanding  Return in about 3 months (around 09/04/2014), or if symptoms worsen or fail to improve, for Hemoglobin A1C and Follow up, DM, Follow up HTN.  The patient was given clear instructions to go to ER or return to medical center if symptoms don't improve, worsen or new problems develop. The patient verbalized understanding. The patient was told to call to get lab results if they haven't heard anything in the next week.   This note has been created with Surveyor, quantity. Any transcriptional errors are unintentional.    Angelica Chessman, MD, [place], [place], [place] and [place] [address] [place], [place]   06/04/2014, 3:44 PM

## 2014-06-04 NOTE — Patient Instructions (Signed)
DASH Eating Plan DASH stands for "Dietary Approaches to Stop Hypertension." The DASH eating plan is a healthy eating plan that has been shown to reduce high blood pressure (hypertension). Additional health benefits may include reducing the risk of type 2 diabetes mellitus, heart disease, and stroke. The DASH eating plan may also help with weight loss. WHAT DO I NEED TO KNOW ABOUT THE DASH EATING PLAN? For the DASH eating plan, you will follow these general guidelines:  Choose foods with a percent daily value for sodium of less than 5% (as listed on the food label).  Use salt-free seasonings or herbs instead of table salt or sea salt.  Check with your health care provider or pharmacist before using salt substitutes.  Eat lower-sodium products, often labeled as "lower sodium" or "no salt added."  Eat fresh foods.  Eat more vegetables, fruits, and low-fat dairy products.  Choose whole grains. Look for the word "whole" as the first word in the ingredient list.  Choose fish and skinless chicken or turkey more often than red meat. Limit fish, poultry, and meat to 6 oz (170 g) each day.  Limit sweets, desserts, sugars, and sugary drinks.  Choose heart-healthy fats.  Limit cheese to 1 oz (28 g) per day.  Eat more home-cooked food and less restaurant, buffet, and fast food.  Limit fried foods.  Cook foods using methods other than frying.  Limit canned vegetables. If you do use them, rinse them well to decrease the sodium.  When eating at a restaurant, ask that your food be prepared with less salt, or no salt if possible. WHAT FOODS CAN I EAT? Seek help from a dietitian for individual calorie needs. Grains Whole grain or whole wheat bread. Brown rice. Whole grain or whole wheat pasta. Quinoa, bulgur, and whole grain cereals. Low-sodium cereals. Corn or whole wheat flour tortillas. Whole grain cornbread. Whole grain crackers. Low-sodium crackers. Vegetables Fresh or frozen vegetables  (raw, steamed, roasted, or grilled). Low-sodium or reduced-sodium tomato and vegetable juices. Low-sodium or reduced-sodium tomato sauce and paste. Low-sodium or reduced-sodium canned vegetables.  Fruits All fresh, canned (in natural juice), or frozen fruits. Meat and Other Protein Products Ground beef (85% or leaner), grass-fed beef, or beef trimmed of fat. Skinless chicken or turkey. Ground chicken or turkey. Pork trimmed of fat. All fish and seafood. Eggs. Dried beans, peas, or lentils. Unsalted nuts and seeds. Unsalted canned beans. Dairy Low-fat dairy products, such as skim or 1% milk, 2% or reduced-fat cheeses, low-fat ricotta or cottage cheese, or plain low-fat yogurt. Low-sodium or reduced-sodium cheeses. Fats and Oils Tub margarines without trans fats. Light or reduced-fat mayonnaise and salad dressings (reduced sodium). Avocado. Safflower, olive, or canola oils. Natural peanut or almond butter. Other Unsalted popcorn and pretzels. The items listed above may not be a complete list of recommended foods or beverages. Contact your dietitian for more options. WHAT FOODS ARE NOT RECOMMENDED? Grains White bread. White pasta. White rice. Refined cornbread. Bagels and croissants. Crackers that contain trans fat. Vegetables Creamed or fried vegetables. Vegetables in a cheese sauce. Regular canned vegetables. Regular canned tomato sauce and paste. Regular tomato and vegetable juices. Fruits Dried fruits. Canned fruit in light or heavy syrup. Fruit juice. Meat and Other Protein Products Fatty cuts of meat. Ribs, chicken wings, bacon, sausage, bologna, salami, chitterlings, fatback, hot dogs, bratwurst, and packaged luncheon meats. Salted nuts and seeds. Canned beans with salt. Dairy Whole or 2% milk, cream, half-and-half, and cream cheese. Whole-fat or sweetened yogurt. Full-fat   cheeses or blue cheese. Nondairy creamers and whipped toppings. Processed cheese, cheese spreads, or cheese  curds. Condiments Onion and garlic salt, seasoned salt, table salt, and sea salt. Canned and packaged gravies. Worcestershire sauce. Tartar sauce. Barbecue sauce. Teriyaki sauce. Soy sauce, including reduced sodium. Steak sauce. Fish sauce. Oyster sauce. Cocktail sauce. Horseradish. Ketchup and mustard. Meat flavorings and tenderizers. Bouillon cubes. Hot sauce. Tabasco sauce. Marinades. Taco seasonings. Relishes. Fats and Oils Butter, stick margarine, lard, shortening, ghee, and bacon fat. Coconut, palm kernel, or palm oils. Regular salad dressings. Other Pickles and olives. Salted popcorn and pretzels. The items listed above may not be a complete list of foods and beverages to avoid. Contact your dietitian for more information. WHERE CAN I FIND MORE INFORMATION? National Heart, Lung, and Blood Institute: www.nhlbi.nih.gov/health/health-topics/topics/dash/ Document Released: 10/05/2011 Document Revised: 03/02/2014 Document Reviewed: 08/20/2013 ExitCare Patient Information 2015 [employer]. This information is not intended to replace advice given to you by your health care provider. Make sure you discuss any questions you have with your health care provider. Hypertension Hypertension, commonly called high blood pressure, is when the force of blood pumping through your arteries is too strong. Your arteries are the blood vessels that carry blood from your heart throughout your body. A blood pressure reading consists of a higher number over a lower number, such as 110/72. The higher number (systolic) is the pressure inside your arteries when your heart pumps. The lower number (diastolic) is the pressure inside your arteries when your heart relaxes. Ideally you want your blood pressure below 120/80. Hypertension forces your heart to work harder to pump blood. Your arteries may become narrow or stiff. Having hypertension puts you at risk for heart disease, stroke, and other problems.  RISK  FACTORS Some risk factors for high blood pressure are controllable. Others are not.  Risk factors you cannot control include:   Race. You may be at higher risk if you are African American.  Age. Risk increases with age.  Gender. Men are at higher risk than women before age 45 years. After age 65, women are at higher risk than men. Risk factors you can control include:  Not getting enough exercise or physical activity.  Being overweight.  Getting too much fat, sugar, calories, or salt in your diet.  Drinking too much alcohol. SIGNS AND SYMPTOMS Hypertension does not usually cause signs or symptoms. Extremely high blood pressure (hypertensive crisis) may cause headache, anxiety, shortness of breath, and nosebleed. DIAGNOSIS  To check if you have hypertension, your health care provider will measure your blood pressure while you are seated, with your arm held at the level of your heart. It should be measured at least twice using the same arm. Certain conditions can cause a difference in blood pressure between your right and left arms. A blood pressure reading that is higher than normal on one occasion does not mean that you need treatment. If one blood pressure reading is high, ask your health care provider about having it checked again. TREATMENT  Treating high blood pressure includes making lifestyle changes and possibly taking medicine. Living a healthy lifestyle can help lower high blood pressure. You may need to change some of your habits. Lifestyle changes may include:  Following the DASH diet. This diet is high in fruits, vegetables, and whole grains. It is low in salt, red meat, and added sugars.  Getting at least 2 hours of brisk physical activity every week.  Losing weight if necessary.  Not smoking.  Limiting   alcoholic beverages.  Learning ways to reduce stress. If lifestyle changes are not enough to get your blood pressure under control, your health care provider may  prescribe medicine. You may need to take more than one. Work closely with your health care provider to understand the risks and benefits. HOME CARE INSTRUCTIONS  Have your blood pressure rechecked as directed by your health care provider.   Take medicines only as directed by your health care provider. Follow the directions carefully. Blood pressure medicines must be taken as prescribed. The medicine does not work as well when you skip doses. Skipping doses also puts you at risk for problems.   Do not smoke.   Monitor your blood pressure at home as directed by your health care provider. SEEK MEDICAL CARE IF:   You think you are having a reaction to medicines taken.  You have recurrent headaches or feel dizzy.  You have swelling in your ankles.  You have trouble with your vision. SEEK IMMEDIATE MEDICAL CARE IF:  You develop a severe headache or confusion.  You have unusual weakness, numbness, or feel faint.  You have severe chest or abdominal pain.  You vomit repeatedly.  You have trouble breathing. MAKE SURE YOU:   Understand these instructions.  Will watch your condition.  Will get help right away if you are not doing well or get worse. Document Released: 10/16/2005 Document Revised: 03/02/2014 Document Reviewed: 08/08/2013 ExitCare Patient Information 2015 [employer], [employer]. This information is not intended to replace advice given to you by your health care provider. Make sure you discuss any questions you have with your health care provider.  

## 2014-06-04 NOTE — Progress Notes (Signed)
I agree with the above plan 

## 2014-06-04 NOTE — Progress Notes (Signed)
Pt here to f/u frequent headaches with stroke workup @ [facility]. Pt describes pain as sharp,achy pain in frontal head area radiating to both sides of face with left side drifting. Pt was seen in ER with negative Ct scan and blood work Pt states her blood pressure was elevated in ER 180/90's  New medications added Propranolol 20 mg tab, Elavil 10 mg tab to regimen with positive effect C/o continued headaches with weakness to left side Rates pain 3/10 without n/v or chest pain

## 2014-06-10 ENCOUNTER — Ambulatory Visit: Payer: Self-pay | Admitting: Nurse Practitioner

## 2014-06-15 ENCOUNTER — Other Ambulatory Visit: Payer: Self-pay | Admitting: Physical Medicine & Rehabilitation

## 2014-06-22 ENCOUNTER — Other Ambulatory Visit: Payer: Self-pay | Admitting: Internal Medicine

## 2014-07-16 ENCOUNTER — Encounter: Payer: Self-pay | Admitting: Internal Medicine

## 2014-07-16 ENCOUNTER — Ambulatory Visit: Payer: Self-pay | Attending: Internal Medicine | Admitting: Internal Medicine

## 2014-07-16 VITALS — BP 146/77 | HR 81 | Temp 98.7°F | Resp 16 | Ht 68.0 in | Wt 253.0 lb

## 2014-07-16 DIAGNOSIS — R51 Headache: Secondary | ICD-10-CM | POA: Insufficient documentation

## 2014-07-16 DIAGNOSIS — Z8673 Personal history of transient ischemic attack (TIA), and cerebral infarction without residual deficits: Secondary | ICD-10-CM | POA: Insufficient documentation

## 2014-07-16 DIAGNOSIS — I10 Essential (primary) hypertension: Secondary | ICD-10-CM | POA: Insufficient documentation

## 2014-07-16 MED ORDER — IBUPROFEN 800 MG PO TABS
800.0000 mg | ORAL_TABLET | Freq: Three times a day (TID) | ORAL | Status: DC | PRN
Start: 2014-07-16 — End: 2015-09-26

## 2014-07-16 MED ORDER — POTASSIUM CHLORIDE ER 10 MEQ PO TBCR: 10.0000 meq | EXTENDED_RELEASE_TABLET | Freq: Every day | ORAL | Status: DC

## 2014-07-16 NOTE — Progress Notes (Signed)
Pt is here following up on her HTN. 

## 2014-07-16 NOTE — Progress Notes (Signed)
Patient ID: Alison Ward, female   DOB: May 08, 1961, 53 y.o.   MRN: [id number]   Alison Ward, is a 54 y.o. female  [id number]  [id number]  DOB - 1961-03-11  Chief Complaint  Patient presents with  . Follow-up        Subjective:   Alison Ward is a 53 y.o. female here today for a follow up visit. Patient is here following up on her hypertension. She has no new complaints today except for her chronic headaches, and able to ibuprofen. Her medications include lisinopril-hydrochlorothiazide propranolol and amitriptyline, she is also on potassium supplement. She needs refill on her potassium tablet. Patient had blood pressure is controlled at home. Patient has No chest pain, No abdominal pain - No Nausea, No new weakness tingling or numbness, No Cough - SOB.  No problems updated.  ALLERGIES: No Known Allergies  PAST MEDICAL HISTORY: Past Medical History  Diagnosis Date  . Hypertension   . Heart murmur   . Stroke 07/05/2013  . Chronic headaches     daily, bitemporal, associated with tingling in face    MEDICATIONS AT HOME: Prior to Admission medications   Medication Sig Start Date End Date Taking? Authorizing Provider  lisinopril-hydrochlorothiazide (PRINZIDE,ZESTORETIC) 20-25 MG per tablet Take 1 tablet by mouth daily. 06/04/14  Yes Tresa Garter, MD  tiZANidine (ZANAFLEX) 2 MG tablet Take 1 tablet (2 mg total) by mouth 3 (three) times daily. 03/06/14  Yes Philmore Pali, NP  amitriptyline (ELAVIL) 10 MG tablet Take 1 tablet (10 mg total) by mouth at bedtime. Increase dose by 1 tablets every 2 weeks. 06/01/14   Philmore Pali, NP  ibuprofen (ADVIL,MOTRIN) 800 MG tablet Take 1 tablet (800 mg total) by mouth every 8 (eight) hours as needed for headache or mild pain. 07/16/14   Tresa Garter, MD  potassium chloride (K-DUR) 10 MEQ tablet Take 1 tablet (10 mEq total) by mouth daily. 07/16/14   Tresa Garter, MD  promethazine (PHENERGAN) 25 MG tablet Take 1 tablet (25 mg  total) by mouth every 6 (six) hours as needed for nausea or vomiting (or headache). 05/27/14   Francine Graven, DO  propranolol (INDERAL) 20 MG tablet Take 1 tablet (20 mg total) by mouth 2 (two) times daily. 06/01/14   Philmore Pali, NP     Objective:   Filed Vitals:   07/16/14 1440  BP: 146/77  Pulse: 81  Temp: 98.7 F (37.1 C)  TempSrc: Oral  Resp: 16  Height: 5\' 8"  (1.727 m)  Weight: 253 lb (114.76 kg)  SpO2: 100%    Exam General appearance : Awake, alert, not in any distress. Speech Clear. Not toxic looking HEENT: Atraumatic and Normocephalic, pupils equally reactive to light and accomodation Neck: supple, no JVD. No cervical lymphadenopathy.  Chest:Good air entry bilaterally, no added sounds  CVS: S1 S2 regular, + murmur.  Abdomen: Bowel sounds present, Non tender and not distended with no gaurding, rigidity or rebound. Extremities: B/L Lower Ext shows no edema, both legs are warm to touch Neurology: Awake alert, and oriented X 3, CN II-XII intact, Non focal   Data Review Lab Results  Component Value Date   HGBA1C 6.0* 07/06/2013     Assessment & Plan   1. Essential hypertension, benign  - potassium chloride (K-DUR) 10 MEQ tablet; Take 1 tablet (10 mEq total) by mouth daily.  Dispense: 30 tablet; Refill: 3  - We have discussed target BP range and blood pressure goal - I have  advised patient to check BP regularly and to call us back or report to clinic if the numbers are consistently higher than 140/90  - We discussed the importance of compliance with medical therapy and DASH diet recommended, consequences of uncontrolled hypertension discussed.  - continue current BP medications  2. Headache(784.0)  - ibuprofen (ADVIL,MOTRIN) 800 MG tablet; Take 1 tablet (800 mg total) by mouth every 8 (eight) hours as needed for headache or mild pain.  Dispense: 30 tablet; Refill: 1   Return in about 3 months (around 10/15/2014), or if symptoms worsen or fail to improve, for  Follow up HTN, Pap Smear.  The patient was given clear instructions to go to ER or return to medical center if symptoms don't improve, worsen or new problems develop. The patient verbalized understanding. The patient was told to call to get lab results if they haven't heard anything in the next week.   This note has been created with Surveyor, quantity. Any transcriptional errors are unintentional.    Angelica Chessman, MD, [place], [place], [place] and [place], [place]   07/16/2014, 3:14 PM

## 2014-07-16 NOTE — Patient Instructions (Signed)
DASH Eating Plan DASH stands for "Dietary Approaches to Stop Hypertension." The DASH eating plan is a healthy eating plan that has been shown to reduce high blood pressure (hypertension). Additional health benefits may include reducing the risk of type 2 diabetes mellitus, heart disease, and stroke. The DASH eating plan may also help with weight loss. WHAT DO I NEED TO KNOW ABOUT THE DASH EATING PLAN? For the DASH eating plan, you will follow these general guidelines:  Choose foods with a percent daily value for sodium of less than 5% (as listed on the food label).  Use salt-free seasonings or herbs instead of table salt or sea salt.  Check with your health care provider or pharmacist before using salt substitutes.  Eat lower-sodium products, often labeled as "lower sodium" or "no salt added."  Eat fresh foods.  Eat more vegetables, fruits, and low-fat dairy products.  Choose whole grains. Look for the word "whole" as the first word in the ingredient list.  Choose fish and skinless chicken or turkey more often than red meat. Limit fish, poultry, and meat to 6 oz (170 g) each day.  Limit sweets, desserts, sugars, and sugary drinks.  Choose heart-healthy fats.  Limit cheese to 1 oz (28 g) per day.  Eat more home-cooked food and less restaurant, buffet, and fast food.  Limit fried foods.  Cook foods using methods other than frying.  Limit canned vegetables. If you do use them, rinse them well to decrease the sodium.  When eating at a restaurant, ask that your food be prepared with less salt, or no salt if possible. WHAT FOODS CAN I EAT? Seek help from a dietitian for individual calorie needs. Grains Whole grain or whole wheat bread. Brown rice. Whole grain or whole wheat pasta. Quinoa, bulgur, and whole grain cereals. Low-sodium cereals. Corn or whole wheat flour tortillas. Whole grain cornbread. Whole grain crackers. Low-sodium crackers. Vegetables Fresh or frozen vegetables  (raw, steamed, roasted, or grilled). Low-sodium or reduced-sodium tomato and vegetable juices. Low-sodium or reduced-sodium tomato sauce and paste. Low-sodium or reduced-sodium canned vegetables.  Fruits All fresh, canned (in natural juice), or frozen fruits. Meat and Other Protein Products Ground beef (85% or leaner), grass-fed beef, or beef trimmed of fat. Skinless chicken or turkey. Ground chicken or turkey. Pork trimmed of fat. All fish and seafood. Eggs. Dried beans, peas, or lentils. Unsalted nuts and seeds. Unsalted canned beans. Dairy Low-fat dairy products, such as skim or 1% milk, 2% or reduced-fat cheeses, low-fat ricotta or cottage cheese, or plain low-fat yogurt. Low-sodium or reduced-sodium cheeses. Fats and Oils Tub margarines without trans fats. Light or reduced-fat mayonnaise and salad dressings (reduced sodium). Avocado. Safflower, olive, or canola oils. Natural peanut or almond butter. Other Unsalted popcorn and pretzels. The items listed above may not be a complete list of recommended foods or beverages. Contact your dietitian for more options. WHAT FOODS ARE NOT RECOMMENDED? Grains White bread. White pasta. White rice. Refined cornbread. Bagels and croissants. Crackers that contain trans fat. Vegetables Creamed or fried vegetables. Vegetables in a cheese sauce. Regular canned vegetables. Regular canned tomato sauce and paste. Regular tomato and vegetable juices. Fruits Dried fruits. Canned fruit in light or heavy syrup. Fruit juice. Meat and Other Protein Products Fatty cuts of meat. Ribs, chicken wings, bacon, sausage, bologna, salami, chitterlings, fatback, hot dogs, bratwurst, and packaged luncheon meats. Salted nuts and seeds. Canned beans with salt. Dairy Whole or 2% milk, cream, half-and-half, and cream cheese. Whole-fat or sweetened yogurt. Full-fat   cheeses or blue cheese. Nondairy creamers and whipped toppings. Processed cheese, cheese spreads, or cheese  curds. Condiments Onion and garlic salt, seasoned salt, table salt, and sea salt. Canned and packaged gravies. Worcestershire sauce. Tartar sauce. Barbecue sauce. Teriyaki sauce. Soy sauce, including reduced sodium. Steak sauce. Fish sauce. Oyster sauce. Cocktail sauce. Horseradish. Ketchup and mustard. Meat flavorings and tenderizers. Bouillon cubes. Hot sauce. Tabasco sauce. Marinades. Taco seasonings. Relishes. Fats and Oils Butter, stick margarine, lard, shortening, ghee, and bacon fat. Coconut, palm kernel, or palm oils. Regular salad dressings. Other Pickles and olives. Salted popcorn and pretzels. The items listed above may not be a complete list of foods and beverages to avoid. Contact your dietitian for more information. WHERE CAN I FIND MORE INFORMATION? National Heart, Lung, and Blood Institute: www.nhlbi.nih.gov/health/health-topics/topics/dash/ Document Released: 10/05/2011 Document Revised: 03/02/2014 Document Reviewed: 08/20/2013 ExitCare Patient Information 2015 [employer]. This information is not intended to replace advice given to you by your health care provider. Make sure you discuss any questions you have with your health care provider. Hypertension Hypertension, commonly called high blood pressure, is when the force of blood pumping through your arteries is too strong. Your arteries are the blood vessels that carry blood from your heart throughout your body. A blood pressure reading consists of a higher number over a lower number, such as 110/72. The higher number (systolic) is the pressure inside your arteries when your heart pumps. The lower number (diastolic) is the pressure inside your arteries when your heart relaxes. Ideally you want your blood pressure below 120/80. Hypertension forces your heart to work harder to pump blood. Your arteries may become narrow or stiff. Having hypertension puts you at risk for heart disease, stroke, and other problems.  RISK  FACTORS Some risk factors for high blood pressure are controllable. Others are not.  Risk factors you cannot control include:   Race. You may be at higher risk if you are African American.  Age. Risk increases with age.  Gender. Men are at higher risk than women before age 45 years. After age 65, women are at higher risk than men. Risk factors you can control include:  Not getting enough exercise or physical activity.  Being overweight.  Getting too much fat, sugar, calories, or salt in your diet.  Drinking too much alcohol. SIGNS AND SYMPTOMS Hypertension does not usually cause signs or symptoms. Extremely high blood pressure (hypertensive crisis) may cause headache, anxiety, shortness of breath, and nosebleed. DIAGNOSIS  To check if you have hypertension, your health care provider will measure your blood pressure while you are seated, with your arm held at the level of your heart. It should be measured at least twice using the same arm. Certain conditions can cause a difference in blood pressure between your right and left arms. A blood pressure reading that is higher than normal on one occasion does not mean that you need treatment. If one blood pressure reading is high, ask your health care provider about having it checked again. TREATMENT  Treating high blood pressure includes making lifestyle changes and possibly taking medicine. Living a healthy lifestyle can help lower high blood pressure. You may need to change some of your habits. Lifestyle changes may include:  Following the DASH diet. This diet is high in fruits, vegetables, and whole grains. It is low in salt, red meat, and added sugars.  Getting at least 2 hours of brisk physical activity every week.  Losing weight if necessary.  Not smoking.  Limiting   alcoholic beverages.  Learning ways to reduce stress. If lifestyle changes are not enough to get your blood pressure under control, your health care provider may  prescribe medicine. You may need to take more than one. Work closely with your health care provider to understand the risks and benefits. HOME CARE INSTRUCTIONS  Have your blood pressure rechecked as directed by your health care provider.   Take medicines only as directed by your health care provider. Follow the directions carefully. Blood pressure medicines must be taken as prescribed. The medicine does not work as well when you skip doses. Skipping doses also puts you at risk for problems.   Do not smoke.   Monitor your blood pressure at home as directed by your health care provider. SEEK MEDICAL CARE IF:   You think you are having a reaction to medicines taken.  You have recurrent headaches or feel dizzy.  You have swelling in your ankles.  You have trouble with your vision. SEEK IMMEDIATE MEDICAL CARE IF:  You develop a severe headache or confusion.  You have unusual weakness, numbness, or feel faint.  You have severe chest or abdominal pain.  You vomit repeatedly.  You have trouble breathing. MAKE SURE YOU:   Understand these instructions.  Will watch your condition.  Will get help right away if you are not doing well or get worse. Document Released: 10/16/2005 Document Revised: 03/02/2014 Document Reviewed: 08/08/2013 ExitCare Patient Information 2015 [employer], [employer]. This information is not intended to replace advice given to you by your health care provider. Make sure you discuss any questions you have with your health care provider.  

## 2014-07-22 ENCOUNTER — Ambulatory Visit: Payer: Self-pay

## 2014-08-06 ENCOUNTER — Ambulatory Visit: Payer: Self-pay | Attending: Internal Medicine | Admitting: Internal Medicine

## 2014-08-06 ENCOUNTER — Encounter: Payer: Self-pay | Admitting: Internal Medicine

## 2014-08-06 VITALS — BP 118/83 | HR 78 | Temp 97.9°F | Resp 16 | Ht 69.0 in | Wt 251.0 lb

## 2014-08-06 DIAGNOSIS — H00019 Hordeolum externum unspecified eye, unspecified eyelid: Secondary | ICD-10-CM | POA: Insufficient documentation

## 2014-08-06 DIAGNOSIS — R51 Headache: Secondary | ICD-10-CM | POA: Insufficient documentation

## 2014-08-06 DIAGNOSIS — H00016 Hordeolum externum left eye, unspecified eyelid: Secondary | ICD-10-CM | POA: Insufficient documentation

## 2014-08-06 DIAGNOSIS — I10 Essential (primary) hypertension: Secondary | ICD-10-CM | POA: Insufficient documentation

## 2014-08-06 DIAGNOSIS — Z8673 Personal history of transient ischemic attack (TIA), and cerebral infarction without residual deficits: Secondary | ICD-10-CM | POA: Insufficient documentation

## 2014-08-06 MED ORDER — ERYTHROMYCIN 5 MG/GM OP OINT: 1.0000 "application " | TOPICAL_OINTMENT | Freq: Every day | OPHTHALMIC | Status: DC

## 2014-08-06 NOTE — Patient Instructions (Signed)

## 2014-08-06 NOTE — Progress Notes (Signed)
Patient ID: ZETTIE GOOTEE, female   DOB: 10/10/1961, 53 y.o.   MRN: [id number]   Alison Ward, is a 53 y.o. female  [id number]  [id number]  DOB - Feb 13, 1961  Chief Complaint  Patient presents with  . Follow-up        Subjective:   Alison Ward is a 53 y.o. female here today for a follow up visit. Patient is here today complaining of bump on her left eye. She has not used any medication. Mild pain associated. No blurry vision. No redness or eye discharge. She has medical history significant for hypertension, heart murmur and chronic headaches. Patient has No headache today, No chest pain, No abdominal pain - No Nausea, No new weakness tingling or numbness, No Cough - SOB.  Problem  Stye    ALLERGIES: No Known Allergies  PAST MEDICAL HISTORY: Past Medical History  Diagnosis Date  . Hypertension   . Heart murmur   . Stroke 07/05/2013  . Chronic headaches     daily, bitemporal, associated with tingling in face    MEDICATIONS AT HOME: Prior to Admission medications   Medication Sig Start Date End Date Taking? Authorizing Provider  ibuprofen (ADVIL,MOTRIN) 800 MG tablet Take 1 tablet (800 mg total) by mouth every 8 (eight) hours as needed for headache or mild pain. 07/16/14  Yes Tresa Garter, MD  lisinopril-hydrochlorothiazide (PRINZIDE,ZESTORETIC) 20-25 MG per tablet Take 1 tablet by mouth daily. 06/04/14  Yes Tresa Garter, MD  potassium chloride (K-DUR) 10 MEQ tablet Take 1 tablet (10 mEq total) by mouth daily. 07/16/14  Yes Tresa Garter, MD  amitriptyline (ELAVIL) 10 MG tablet Take 1 tablet (10 mg total) by mouth at bedtime. Increase dose by 1 tablets every 2 weeks. 06/01/14   Philmore Pali, NP  erythromycin [facility]) ophthalmic ointment Place 1 application into the left eye at bedtime. 08/06/14   Tresa Garter, MD  promethazine (PHENERGAN) 25 MG tablet Take 1 tablet (25 mg total) by mouth every 6 (six) hours as needed for nausea or vomiting (or  headache). 05/27/14   Francine Graven, DO  propranolol (INDERAL) 20 MG tablet Take 1 tablet (20 mg total) by mouth 2 (two) times daily. 06/01/14   Philmore Pali, NP  tiZANidine (ZANAFLEX) 2 MG tablet Take 1 tablet (2 mg total) by mouth 3 (three) times daily. 03/06/14   Philmore Pali, NP     Objective:   Filed Vitals:   08/06/14 1435  BP: 118/83  Pulse: 78  Temp: 97.9 F (36.6 C)  TempSrc: Oral  Resp: 16  Height: 5\' 9"  (1.753 m)  Weight: 251 lb (113.853 kg)  SpO2: 100%    Exam General appearance : Awake, alert, not in any distress. Speech Clear. Not toxic looking HEENT: Atraumatic and Normocephalic, pupils equally reactive to light and accomodation, Stye, left eye Neck: supple, no JVD. No cervical lymphadenopathy.  Chest:Good air entry bilaterally, no added sounds  CVS: S1 S2 regular, no murmurs.  Abdomen: Bowel sounds present, Non tender and not distended with no gaurding, rigidity or rebound. Extremities: B/L Lower Ext shows no edema, both legs are warm to touch Neurology: Awake alert, and oriented X 3, CN II-XII intact, Non focal  Data Review Lab Results  Component Value Date   HGBA1C 6.0* 07/06/2013     Assessment & Plan   1. Stye, left  - erythromycin [facility]) ophthalmic ointment; Place 1 application into the left eye at bedtime.  Dispense: 3.5 g; Refill: 0  Return in about 3 months (around 11/06/2014), or if symptoms worsen or fail to improve, for Follow up HTN, Follow up Pain and comorbidities.  The patient was given clear instructions to go to ER or return to medical center if symptoms don't improve, worsen or new problems develop. The patient verbalized understanding. The patient was told to call to get lab results if they haven't heard anything in the next week.   This note has been created with Surveyor, quantity. Any transcriptional errors are unintentional.    Angelica Chessman, MD, [place], [place], [place] and [place], [place]   08/06/2014, 2:56 PM

## 2014-08-06 NOTE — Progress Notes (Signed)
Pt is here today b/c she has a sty on her left eye.

## 2014-08-11 ENCOUNTER — Ambulatory Visit (INDEPENDENT_AMBULATORY_CARE_PROVIDER_SITE_OTHER): Payer: Self-pay | Admitting: Nurse Practitioner

## 2014-08-11 ENCOUNTER — Encounter: Payer: Self-pay | Admitting: Nurse Practitioner

## 2014-08-11 VITALS — BP 115/79 | HR 81 | Temp 99.0°F | Ht 68.0 in | Wt 253.0 lb

## 2014-08-11 DIAGNOSIS — G44209 Tension-type headache, unspecified, not intractable: Secondary | ICD-10-CM

## 2014-08-11 DIAGNOSIS — G250 Essential tremor: Secondary | ICD-10-CM

## 2014-08-11 MED ORDER — PROPRANOLOL HCL 20 MG PO TABS: 20.0000 mg | ORAL_TABLET | Freq: Two times a day (BID) | ORAL | Status: DC

## 2014-08-11 NOTE — Progress Notes (Signed)
PATIENT: Alison Ward DOB: 1961/03/23  REASON FOR VISIT: routine follow up for ICH, headaches, and tremor HISTORY FROM: patient  HISTORY OF PRESENT ILLNESS: 24 year African American lady seen for first office followup visit hospital admission 07/06/13 for intracerebral hemorrhage.   She presented with headache and lethargy with elevated blood pressure of 202/98. CT scan of the head showed a 4. 1 x 2.1 cm right parietal temporal intracerebral hemorrhage with surrounding edema and intraventricular extension azygos 6 mm right-to-left midline shift. She was seen by neurosurgery and underwent emergent right ventriculostomy catheter placement Dr. Arnoldo Morale. She was kept in the intensive care unit and blood pressure was tightly controlled. Carotid Doppler showed nonobstructive stenosis. Stress echo showed normal ejection fraction. Ventriculostomy was subsequently clamped and removed. She had mild left-sided weakness which was improving. She was transferred to inpatient rehabilitation and did well and has been discharged home. She has had substantial improvement in her left-sided strength and is now able to walk independently. She has only mild diminished fine motor skills in the left hand. She states her blood pressure is well controlled than it is 133/86 in the office today. She started working part-time as a Counsellor and feels that her headaches seem to limiting her ability to work. She describes bitemporal pressure-like headaches off and on they're more prominent in the morning. She does not take any medications for this. She admits to diminished attention, concentration, disorganization as well as trouble with multitasking. She occasionally feels off balance when she is tired. She was actually discharged on Depakote for headache but she ran out of prescriptions a month ago.  UPDATE 03/06/14 (LL): Since last visit, headaches were not improved on Depakote daily; she was changed to Topamax by Dr. Leonie Man. She  was titrated to 150 mg daily, still with no relief, and side effects of cognitive difficulties, she weaned herself off. Headaches are worst in the mornings, ease of by 10:30, but may return later in the day. She takes 400 mg ibuprofen as needed for headache pain. She describes sharp, carving pain at her temples when pain is the worst. When pain is worst she feels numbness and tingling around her mouth. Always feels pressure, like a tight band around the head or "swimmer's cap." Never any visual symptoms or nausea. Blood pressure has been under good control, she brings in her log, averaging 120-130's over 70's. BP in office today is 121/79.  UPDATE 06/01/14 (LL): Patient requested sooner followup for headaches. Tramadol prescribed at last visit gave her vertigo, Tizanidine did not help. She went to the ER on 7/29 with severe headache, CT was negative for anything acute. She was given a liter of NS, Toradol, Benadryl, and Phenergan which provided relief. Her headache has seemed to change from feeling like a "swimmer's cap" to now having bilateral sharp temporal pains, which progresses to bilateral cheek numbness and jaw pain. Pain is only a 2/10 today, yesterday she states was between 5/10 and 7/10 all day. She is also bothered by an action tremor in her left hand, which is embarrassing to her in social situations. She states it is worse with holding heavier objects.   UPDATE 08/11/14 (LL): Since last visit patient reports that headaches have subsided. Labs previously for inflammatory markers, Sed rate and CRP were normal. She could not tolerate Amitriptyline 10 mg for headache prevention hasn't made her feel "crazy." She took it for less than 2 weeks. She states that about 2 weeks ago she woke up one  morning feeling very good, without any headache, and was able to clean and do her regular activities without any difficulties. She has been virtually headache free since that morning. Her blood pressures well  controlled, it is 115/79 in office today. She has seen improvement with her tremor since starting propranolol and she is very pleased. She is starting to lose weight that she gained since the stroke. She plans to travel to [place] to see her new granddaughter in December.  REVIEW OF SYSTEMS: Full 14 system review of systems performed and notable only for: No complaints.  ALLERGIES: Allergies  Allergen Reactions  . Elavil [Amitriptyline]     Makes her crazy    HOME MEDICATIONS: Outpatient Prescriptions Prior to Visit  Medication Sig Dispense Refill  . erythromycin  [facility]) ophthalmic ointment Place 1 application into the left eye at bedtime.  3.5 g  0  . ibuprofen (ADVIL,MOTRIN) 800 MG tablet Take 1 tablet (800 mg total) by mouth every 8 (eight) hours as needed for headache or mild pain.  30 tablet  1  . lisinopril-hydrochlorothiazide (PRINZIDE,ZESTORETIC) 20-25 MG per tablet Take 1 tablet by mouth daily.  90 tablet  3  . potassium chloride (K-DUR) 10 MEQ tablet Take 1 tablet (10 mEq total) by mouth daily.  30 tablet  3  . tiZANidine (ZANAFLEX) 2 MG tablet Take 1 tablet (2 mg total) by mouth 3 (three) times daily.  30 tablet  5  . amitriptyline (ELAVIL) 10 MG tablet Take 1 tablet (10 mg total) by mouth at bedtime. Increase dose by 1 tablets every 2 weeks.  120 tablet  1  . promethazine (PHENERGAN) 25 MG tablet Take 1 tablet (25 mg total) by mouth every 6 (six) hours as needed for nausea or vomiting (or headache).  8 tablet  0  . propranolol (INDERAL) 20 MG tablet Take 1 tablet (20 mg total) by mouth 2 (two) times daily.  60 tablet  2   No facility-administered medications prior to visit.    PHYSICAL EXAM Filed Vitals:   08/11/14 1508  BP: 115/79  Pulse: 81  Temp: 99 F (37.2 C)  TempSrc: Oral  Height: 5\' 8"  (1.727 m)  Weight: 253 lb (114.76 kg)   Body mass index is 38.48 kg/(m^2).  Physical Exam  General: middle aged African American lady , seated, in no evident distress    Head: head normocephalic and atraumatic. Orohparynx benign  Neck: supple with no carotid or supraclavicular bruits  Cardiovascular: regular rate and rhythm, no murmurs   Neurologic Exam  Mental Status: Awake and fully alert. Oriented to place and time. Recent and remote memory intact. Attention span, concentration and fund of knowledge appropriate. Mood and affect appropriate.  Cranial Nerves: Pupils equal, briskly reactive to light. Extraocular movements full without nystagmus. Visual fields full to confrontation. Hearing intact. Facial sensation intact. Face, tongue, palate moves normally and symmetrically.  Motor: Normal bulk and tone. Normal strength in all tested extremity muscles. Diminished fine finger movements on the left. Orbits right over left upper extremity. Mild action tremor noted on left.  Sensory: intact to touch and pinprick and vibratory sensation.  Coordination: Rapid alternating movements normal in all extremities. Finger-to-nose and heel-to-shin performed accurately bilaterally.  Gait and Station: Arises from chair without difficulty. Stance is normal. Gait demonstrates normal stride length and balance . Able to heel, toe and tandem walk without difficulty.  Reflexes: 1+ and symmetric.    ASSESSMENT: 20 year African American lady with a right temporoparietal intracerebral hemorrhage  status post ventriculostomy due to the hypertension in September 2014. She is doing quite well except for frequent headaches. Depakote, Topamax, Tizanidine were no benefit. Tramadol and Elavil gave her vertigo.   PLAN: I had a long discussion with the patient regarding her stroke, tremor and headaches and answered questions.  Continue Propranolol 20 mg twice daily for tremor.  Continue Ibuprofen as needed for episodic headache. Continue to maintain aggressive blood pressure control with goal below 130/90.  Return for followup in 6 months, call earlier if necessary.  Meds ordered this  encounter  Medications  . propranolol (INDERAL) 20 MG tablet    Sig: Take 1 tablet (20 mg total) by mouth 2 (two) times daily.    Dispense:  60 tablet    Refill:  5    Order Specific Question:  Supervising Provider    Answer:  Penni Bombard [[id number]]   Return in about 6 months (around 02/10/2015) for headaches, tremor, ICH.  Rudi Rummage LAM, MSN, FNP-BC, A/GNP-C 08/11/2014, 3:42 PM [facility] [address], [place], [place] [address] [phone]  Note: This document was prepared with digital dictation and possible smart phrase technology. Any transcriptional errors that result from this process are unintentional.

## 2014-08-11 NOTE — Patient Instructions (Signed)
I am so happy that your headaches are gone away!  Continue Propranolol 20 mg twice daily for your tremor.   Continue to maintain aggressive blood pressure control with goal below 130/90.   You may travel to see your new granddaughter, please enjoy her!  Return for followup in 6 months, call earlier if necessary.

## 2014-08-13 NOTE — Progress Notes (Signed)
I agree with the above plan 

## 2014-08-20 ENCOUNTER — Encounter: Payer: Self-pay | Admitting: *Deleted

## 2014-09-15 ENCOUNTER — Encounter: Payer: Self-pay | Admitting: Neurology

## 2014-09-21 ENCOUNTER — Ambulatory Visit: Payer: Self-pay | Attending: Internal Medicine | Admitting: Internal Medicine

## 2014-09-21 ENCOUNTER — Encounter: Payer: Self-pay | Admitting: Internal Medicine

## 2014-09-21 VITALS — BP 132/85 | HR 71 | Temp 98.2°F | Resp 16 | Ht 68.0 in | Wt 257.0 lb

## 2014-09-21 DIAGNOSIS — Z8673 Personal history of transient ischemic attack (TIA), and cerebral infarction without residual deficits: Secondary | ICD-10-CM | POA: Insufficient documentation

## 2014-09-21 DIAGNOSIS — I10 Essential (primary) hypertension: Secondary | ICD-10-CM | POA: Insufficient documentation

## 2014-09-21 MED ORDER — POTASSIUM CHLORIDE ER 10 MEQ PO TBCR: 10.0000 meq | EXTENDED_RELEASE_TABLET | Freq: Every day | ORAL | Status: DC

## 2014-09-21 MED ORDER — LISINOPRIL-HYDROCHLOROTHIAZIDE 20-25 MG PO TABS: 1.0000 | ORAL_TABLET | Freq: Every day | ORAL | Status: DC

## 2014-09-21 NOTE — Progress Notes (Signed)
Pt comes in for medication refills for 3 mnths Pt is going home for 3 mnths Denies swelling,numbness,tingling or pain  VSS-  Declined Flu vaccine

## 2014-09-21 NOTE — Progress Notes (Signed)
Patient ID: Alison Ward, female   DOB: [birth year], 53 y.o.   MRN: [id number]   Alison Ward, is a 53 y.o. female  [id number]  [id number]  DOB - 1961-06-15  Chief Complaint  Patient presents with  . Follow-up  . Hypertension  . Medication Refill        Subjective:   Alison Ward is a 53 y.o. female here today for a follow up visit. Patient is here mainly for medication refills today. She will be traveling out to the [place] for about 3-4 months and she needs her medications while she is away. She has no complaint today. She is doing very well with her hypertension on current regimen. She has occasional headache but is not new. No fever. Patient has No headache, No chest pain, No abdominal pain - No Nausea, No new weakness tingling or numbness, No Cough - SOB.  No problems updated.  ALLERGIES: Allergies  Allergen Reactions  . Elavil [Amitriptyline]     Makes her crazy    PAST MEDICAL HISTORY: Past Medical History  Diagnosis Date  . Hypertension   . Heart murmur   . Stroke 07/05/2013  . Chronic headaches     daily, bitemporal, associated with tingling in face    MEDICATIONS AT HOME: Prior to Admission medications   Medication Sig Start Date End Date Taking? Authorizing Provider  ibuprofen (ADVIL,MOTRIN) 800 MG tablet Take 1 tablet (800 mg total) by mouth every 8 (eight) hours as needed for headache or mild pain. 07/16/14  Yes Tresa Garter, MD  lisinopril-hydrochlorothiazide (PRINZIDE,ZESTORETIC) 20-25 MG per tablet Take 1 tablet by mouth daily. 09/21/14  Yes Tresa Garter, MD  potassium chloride (K-DUR) 10 MEQ tablet Take 1 tablet (10 mEq total) by mouth daily. 09/21/14  Yes Tresa Garter, MD     Objective:   Filed Vitals:   09/21/14 1216  BP: 132/85  Pulse: 71  Temp: 98.2 F (36.8 C)  TempSrc: Oral  Resp: 16  Height: 5\' 8"  (1.727 m)  Weight: 257 lb (116.574 kg)  SpO2: 99%    Exam General appearance : Awake, alert, not in  any distress. Speech Clear. Not toxic looking HEENT: Atraumatic and Normocephalic, pupils equally reactive to light and accomodation Neck: supple, no JVD. No cervical lymphadenopathy.  Chest:Good air entry bilaterally, no added sounds  CVS: S1 S2 regular, no murmurs.  Abdomen: Bowel sounds present, Non tender and not distended with no gaurding, rigidity or rebound. Extremities: B/L Lower Ext shows no edema, both legs are warm to touch Neurology: Awake alert, and oriented X 3, CN II-XII intact, Non focal Skin:No Rash Wounds:N/A  Data Review Lab Results  Component Value Date   HGBA1C 6.0* 07/06/2013     Assessment & Plan   1. Essential hypertension, benign  Refill - lisinopril-hydrochlorothiazide (PRINZIDE,ZESTORETIC) 20-25 MG per tablet; Take 1 tablet by mouth daily.  Dispense: 90 tablet; Refill: 3 - potassium chloride (K-DUR) 10 MEQ tablet; Take 1 tablet (10 mEq total) by mouth daily.  Dispense: 90 tablet; Refill: 3    Patient was counseled extensively about nutrition and exercise  Return in about 6 months (around 03/22/2015), or if symptoms worsen or fail to improve, for Follow up HTN, Annual Physical.  The patient was given clear instructions to go to ER or return to medical center if symptoms don't improve, worsen or new problems develop. The patient verbalized understanding. The patient was told to call to get lab results if they haven't heard anything in the  next week.   This note has been created with Surveyor, quantity. Any transcriptional errors are unintentional.    Angelica Chessman, MD, [place], [place], [place] and [place], [place]   09/21/2014, 12:31 PM

## 2014-09-21 NOTE — Patient Instructions (Signed)
DASH Eating Plan DASH stands for "Dietary Approaches to Stop Hypertension." The DASH eating plan is a healthy eating plan that has been shown to reduce high blood pressure (hypertension). Additional health benefits may include reducing the risk of type 2 diabetes mellitus, heart disease, and stroke. The DASH eating plan may also help with weight loss. WHAT DO I NEED TO KNOW ABOUT THE DASH EATING PLAN? For the DASH eating plan, you will follow these general guidelines:  Choose foods with a percent daily value for sodium of less than 5% (as listed on the food label).  Use salt-free seasonings or herbs instead of table salt or sea salt.  Check with your health care provider or pharmacist before using salt substitutes.  Eat lower-sodium products, often labeled as "lower sodium" or "no salt added."  Eat fresh foods.  Eat more vegetables, fruits, and low-fat dairy products.  Choose whole grains. Look for the word "whole" as the first word in the ingredient list.  Choose fish and skinless chicken or turkey more often than red meat. Limit fish, poultry, and meat to 6 oz (170 g) each day.  Limit sweets, desserts, sugars, and sugary drinks.  Choose heart-healthy fats.  Limit cheese to 1 oz (28 g) per day.  Eat more home-cooked food and less restaurant, buffet, and fast food.  Limit fried foods.  Cook foods using methods other than frying.  Limit canned vegetables. If you do use them, rinse them well to decrease the sodium.  When eating at a restaurant, ask that your food be prepared with less salt, or no salt if possible. WHAT FOODS CAN I EAT? Seek help from a dietitian for individual calorie needs. Grains Whole grain or whole wheat bread. Brown rice. Whole grain or whole wheat pasta. Quinoa, bulgur, and whole grain cereals. Low-sodium cereals. Corn or whole wheat flour tortillas. Whole grain cornbread. Whole grain crackers. Low-sodium crackers. Vegetables Fresh or frozen vegetables  (raw, steamed, roasted, or grilled). Low-sodium or reduced-sodium tomato and vegetable juices. Low-sodium or reduced-sodium tomato sauce and paste. Low-sodium or reduced-sodium canned vegetables.  Fruits All fresh, canned (in natural juice), or frozen fruits. Meat and Other Protein Products Ground beef (85% or leaner), grass-fed beef, or beef trimmed of fat. Skinless chicken or turkey. Ground chicken or turkey. Pork trimmed of fat. All fish and seafood. Eggs. Dried beans, peas, or lentils. Unsalted nuts and seeds. Unsalted canned beans. Dairy Low-fat dairy products, such as skim or 1% milk, 2% or reduced-fat cheeses, low-fat ricotta or cottage cheese, or plain low-fat yogurt. Low-sodium or reduced-sodium cheeses. Fats and Oils Tub margarines without trans fats. Light or reduced-fat mayonnaise and salad dressings (reduced sodium). Avocado. Safflower, olive, or canola oils. Natural peanut or almond butter. Other Unsalted popcorn and pretzels. The items listed above may not be a complete list of recommended foods or beverages. Contact your dietitian for more options. WHAT FOODS ARE NOT RECOMMENDED? Grains White bread. White pasta. White rice. Refined cornbread. Bagels and croissants. Crackers that contain trans fat. Vegetables Creamed or fried vegetables. Vegetables in a cheese sauce. Regular canned vegetables. Regular canned tomato sauce and paste. Regular tomato and vegetable juices. Fruits Dried fruits. Canned fruit in light or heavy syrup. Fruit juice. Meat and Other Protein Products Fatty cuts of meat. Ribs, chicken wings, bacon, sausage, bologna, salami, chitterlings, fatback, hot dogs, bratwurst, and packaged luncheon meats. Salted nuts and seeds. Canned beans with salt. Dairy Whole or 2% milk, cream, half-and-half, and cream cheese. Whole-fat or sweetened yogurt. Full-fat   cheeses or blue cheese. Nondairy creamers and whipped toppings. Processed cheese, cheese spreads, or cheese  curds. Condiments Onion and garlic salt, seasoned salt, table salt, and sea salt. Canned and packaged gravies. Worcestershire sauce. Tartar sauce. Barbecue sauce. Teriyaki sauce. Soy sauce, including reduced sodium. Steak sauce. Fish sauce. Oyster sauce. Cocktail sauce. Horseradish. Ketchup and mustard. Meat flavorings and tenderizers. Bouillon cubes. Hot sauce. Tabasco sauce. Marinades. Taco seasonings. Relishes. Fats and Oils Butter, stick margarine, lard, shortening, ghee, and bacon fat. Coconut, palm kernel, or palm oils. Regular salad dressings. Other Pickles and olives. Salted popcorn and pretzels. The items listed above may not be a complete list of foods and beverages to avoid. Contact your dietitian for more information. WHERE CAN I FIND MORE INFORMATION? National Heart, Lung, and Blood Institute: www.nhlbi.nih.gov/health/health-topics/topics/dash/ Document Released: 10/05/2011 Document Revised: 03/02/2014 Document Reviewed: 08/20/2013 ExitCare Patient Information 2015 [employer]. This information is not intended to replace advice given to you by your health care provider. Make sure you discuss any questions you have with your health care provider. Hypertension Hypertension, commonly called high blood pressure, is when the force of blood pumping through your arteries is too strong. Your arteries are the blood vessels that carry blood from your heart throughout your body. A blood pressure reading consists of a higher number over a lower number, such as 110/72. The higher number (systolic) is the pressure inside your arteries when your heart pumps. The lower number (diastolic) is the pressure inside your arteries when your heart relaxes. Ideally you want your blood pressure below 120/80. Hypertension forces your heart to work harder to pump blood. Your arteries may become narrow or stiff. Having hypertension puts you at risk for heart disease, stroke, and other problems.  RISK  FACTORS Some risk factors for high blood pressure are controllable. Others are not.  Risk factors you cannot control include:   Race. You may be at higher risk if you are African American.  Age. Risk increases with age.  Gender. Men are at higher risk than women before age 45 years. After age 65, women are at higher risk than men. Risk factors you can control include:  Not getting enough exercise or physical activity.  Being overweight.  Getting too much fat, sugar, calories, or salt in your diet.  Drinking too much alcohol. SIGNS AND SYMPTOMS Hypertension does not usually cause signs or symptoms. Extremely high blood pressure (hypertensive crisis) may cause headache, anxiety, shortness of breath, and nosebleed. DIAGNOSIS  To check if you have hypertension, your health care provider will measure your blood pressure while you are seated, with your arm held at the level of your heart. It should be measured at least twice using the same arm. Certain conditions can cause a difference in blood pressure between your right and left arms. A blood pressure reading that is higher than normal on one occasion does not mean that you need treatment. If one blood pressure reading is high, ask your health care provider about having it checked again. TREATMENT  Treating high blood pressure includes making lifestyle changes and possibly taking medicine. Living a healthy lifestyle can help lower high blood pressure. You may need to change some of your habits. Lifestyle changes may include:  Following the DASH diet. This diet is high in fruits, vegetables, and whole grains. It is low in salt, red meat, and added sugars.  Getting at least 2 hours of brisk physical activity every week.  Losing weight if necessary.  Not smoking.  Limiting   alcoholic beverages.  Learning ways to reduce stress. If lifestyle changes are not enough to get your blood pressure under control, your health care provider may  prescribe medicine. You may need to take more than one. Work closely with your health care provider to understand the risks and benefits. HOME CARE INSTRUCTIONS  Have your blood pressure rechecked as directed by your health care provider.   Take medicines only as directed by your health care provider. Follow the directions carefully. Blood pressure medicines must be taken as prescribed. The medicine does not work as well when you skip doses. Skipping doses also puts you at risk for problems.   Do not smoke.   Monitor your blood pressure at home as directed by your health care provider. SEEK MEDICAL CARE IF:   You think you are having a reaction to medicines taken.  You have recurrent headaches or feel dizzy.  You have swelling in your ankles.  You have trouble with your vision. SEEK IMMEDIATE MEDICAL CARE IF:  You develop a severe headache or confusion.  You have unusual weakness, numbness, or feel faint.  You have severe chest or abdominal pain.  You vomit repeatedly.  You have trouble breathing. MAKE SURE YOU:   Understand these instructions.  Will watch your condition.  Will get help right away if you are not doing well or get worse. Document Released: 10/16/2005 Document Revised: 03/02/2014 Document Reviewed: 08/08/2013 ExitCare Patient Information 2015 [employer], [employer]. This information is not intended to replace advice given to you by your health care provider. Make sure you discuss any questions you have with your health care provider.  

## 2014-12-25 NOTE — Progress Notes (Signed)
Patient ID: ZADA HASER, female   DOB: May 06, 1961, 54 y.o.   MRN: [id number]   Alison Ward, is a 54 y.o. female  JOI:[id number]  [id number]:[id number]  DOB - 1960-11-21  Chief Complaint  Patient presents with  . Follow-up        Subjective:   Alison Ward is a 54 y.o. female here today for a follow up visit. 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 She has no new complaints but still has ongoing headache. Patient reports that the tramadol prescribed gives her dizziness. Patient is seeing neurologist for her headaches and has been tried on Depakote, changed later to Topamax titrated to about 150 mg daily. Headaches is worse in the mornings and eases off around 10:30 in the morning but may return later in the day. She uses ibuprofen about 400 mg tablet as needed for headache. Headache is like a pressure or a tight band around the head like a swimmer's cap. No visual symptoms. Patient has No chest pain, No abdominal pain - No Nausea, No new weakness tingling or numbness, No Cough - SOB.  No problems updated.  ALLERGIES: Allergies  Allergen Reactions  . Elavil [Amitriptyline]     Makes her crazy    PAST MEDICAL HISTORY: Past Medical History  Diagnosis Date  . Hypertension   . Heart murmur   . Stroke 07/05/2013  . Chronic headaches     daily, bitemporal, associated with tingling in face    MEDICATIONS AT HOME: Prior to Admission medications   Medication Sig Start Date End Date Taking? Authorizing Provider  ibuprofen (ADVIL,MOTRIN) 800 MG tablet Take 1 tablet (800 mg total) by mouth every 8 (eight) hours as needed for headache or mild pain. 07/16/14   Tresa Garter, MD  lisinopril-hydrochlorothiazide (PRINZIDE,ZESTORETIC) 20-25 MG per tablet Take 1 tablet by mouth daily. 09/21/14   Tresa Garter, MD  potassium chloride (K-DUR) 10 MEQ tablet Take 1 tablet (10 mEq total) by mouth daily. 09/21/14   Tresa Garter, MD     Objective:   Filed Vitals:   04/16/14 1454  BP: 138/92  Pulse: 75  Temp: 98 F (36.7 C)  TempSrc: Oral  Resp: 14  Height: 5\' 8"  (1.727 m)  Weight: 256 lb (116.121 kg)  SpO2: 98%    Exam General appearance : Awake, alert, not in any distress. Speech Clear. Not toxic looking, obese  HEENT: Atraumatic and Normocephalic, pupils equally reactive to light and accomodation Neck: supple, no JVD. No cervical lymphadenopathy.  Chest:Good air entry bilaterally, no added sounds  CVS: S1 S2 regular, +murmurs.  Abdomen: Bowel sounds present, Non tender and not distended with no gaurding, rigidity or rebound. Extremities: B/L Lower Ext shows no edema, both legs are warm to touch Neurology: Awake alert, and oriented X 3, CN II-XII intact, Non focal Skin:No Rash Wounds:N/A  Data Review Lab Results  Component Value Date   HGBA1C 6.0* 07/06/2013     Assessment & Plan   1. Essential hypertension, benign  - Continue lisinopril/hydrochlorothiazide 20-25 milligrams tablet by mouth daily - DASH Diet  2. Obesity, unspecified  - Patient has been counseled extensively about nutrition and exercise  3. CVA (cerebral vascular accident)  Follow-up with a neurologist  4. Headache(784.0)  NSAID when necessary for headache If headache persists then we may need to do further investigations. Laboratory tramadol since it  makes patient is a  Patient was counseled extensively about nutrition and exercise.  Return in about 6 months (around 10/16/2014), or if symptoms worsen or fail to improve, for Follow up HTN, Follow up Pain and comorbidities.  The patient was given clear instructions to go to ER or return to medical center if symptoms don't improve, worsen or new problems develop. The patient verbalized understanding. The patient was told to call to get  lab results if they haven't heard anything in the next week.   This note has been created with Surveyor, quantity. Any transcriptional errors are unintentional.    Angelica Chessman, MD, [place], [place], [place] and [facility] [place], [place]

## 2015-02-04 ENCOUNTER — Ambulatory Visit: Payer: Self-pay | Attending: Internal Medicine | Admitting: Internal Medicine

## 2015-02-04 ENCOUNTER — Encounter: Payer: Self-pay | Admitting: Internal Medicine

## 2015-02-04 VITALS — BP 127/88 | HR 76 | Temp 98.0°F | Resp 16 | Ht 68.0 in | Wt 247.0 lb

## 2015-02-04 DIAGNOSIS — I10 Essential (primary) hypertension: Secondary | ICD-10-CM

## 2015-02-04 MED ORDER — POTASSIUM CHLORIDE ER 10 MEQ PO TBCR: 10.0000 meq | EXTENDED_RELEASE_TABLET | Freq: Every day | ORAL | Status: DC

## 2015-02-04 MED ORDER — LISINOPRIL-HYDROCHLOROTHIAZIDE 20-25 MG PO TABS: 1.0000 | ORAL_TABLET | Freq: Every day | ORAL | Status: DC

## 2015-02-04 NOTE — Patient Instructions (Signed)
DASH Eating Plan DASH stands for "Dietary Approaches to Stop Hypertension." The DASH eating plan is a healthy eating plan that has been shown to reduce high blood pressure (hypertension). Additional health benefits may include reducing the risk of type 2 diabetes mellitus, heart disease, and stroke. The DASH eating plan may also help with weight loss. WHAT DO I NEED TO KNOW ABOUT THE DASH EATING PLAN? For the DASH eating plan, you will follow these general guidelines:  Choose foods with a percent daily value for sodium of less than 5% (as listed on the food label).  Use salt-free seasonings or herbs instead of table salt or sea salt.  Check with your health care provider or pharmacist before using salt substitutes.  Eat lower-sodium products, often labeled as "lower sodium" or "no salt added."  Eat fresh foods.  Eat more vegetables, fruits, and low-fat dairy products.  Choose whole grains. Look for the word "whole" as the first word in the ingredient list.  Choose fish and skinless chicken or turkey more often than red meat. Limit fish, poultry, and meat to 6 oz (170 g) each day.  Limit sweets, desserts, sugars, and sugary drinks.  Choose heart-healthy fats.  Limit cheese to 1 oz (28 g) per day.  Eat more home-cooked food and less restaurant, buffet, and fast food.  Limit fried foods.  Cook foods using methods other than frying.  Limit canned vegetables. If you do use them, rinse them well to decrease the sodium.  When eating at a restaurant, ask that your food be prepared with less salt, or no salt if possible. WHAT FOODS CAN I EAT? Seek help from a dietitian for individual calorie needs. Grains Whole grain or whole wheat bread. Brown rice. Whole grain or whole wheat pasta. Quinoa, bulgur, and whole grain cereals. Low-sodium cereals. Corn or whole wheat flour tortillas. Whole grain cornbread. Whole grain crackers. Low-sodium crackers. Vegetables Fresh or frozen vegetables  (raw, steamed, roasted, or grilled). Low-sodium or reduced-sodium tomato and vegetable juices. Low-sodium or reduced-sodium tomato sauce and paste. Low-sodium or reduced-sodium canned vegetables.  Fruits All fresh, canned (in natural juice), or frozen fruits. Meat and Other Protein Products Ground beef (85% or leaner), grass-fed beef, or beef trimmed of fat. Skinless chicken or turkey. Ground chicken or turkey. Pork trimmed of fat. All fish and seafood. Eggs. Dried beans, peas, or lentils. Unsalted nuts and seeds. Unsalted canned beans. Dairy Low-fat dairy products, such as skim or 1% milk, 2% or reduced-fat cheeses, low-fat ricotta or cottage cheese, or plain low-fat yogurt. Low-sodium or reduced-sodium cheeses. Fats and Oils Tub margarines without trans fats. Light or reduced-fat mayonnaise and salad dressings (reduced sodium). Avocado. Safflower, olive, or canola oils. Natural peanut or almond butter. Other Unsalted popcorn and pretzels. The items listed above may not be a complete list of recommended foods or beverages. Contact your dietitian for more options. WHAT FOODS ARE NOT RECOMMENDED? Grains White bread. White pasta. White rice. Refined cornbread. Bagels and croissants. Crackers that contain trans fat. Vegetables Creamed or fried vegetables. Vegetables in a cheese sauce. Regular canned vegetables. Regular canned tomato sauce and paste. Regular tomato and vegetable juices. Fruits Dried fruits. Canned fruit in light or heavy syrup. Fruit juice. Meat and Other Protein Products Fatty cuts of meat. Ribs, chicken wings, bacon, sausage, bologna, salami, chitterlings, fatback, hot dogs, bratwurst, and packaged luncheon meats. Salted nuts and seeds. Canned beans with salt. Dairy Whole or 2% milk, cream, half-and-half, and cream cheese. Whole-fat or sweetened yogurt. Full-fat   cheeses or blue cheese. Nondairy creamers and whipped toppings. Processed cheese, cheese spreads, or cheese  curds. Condiments Onion and garlic salt, seasoned salt, table salt, and sea salt. Canned and packaged gravies. Worcestershire sauce. Tartar sauce. Barbecue sauce. Teriyaki sauce. Soy sauce, including reduced sodium. Steak sauce. Fish sauce. Oyster sauce. Cocktail sauce. Horseradish. Ketchup and mustard. Meat flavorings and tenderizers. Bouillon cubes. Hot sauce. Tabasco sauce. Marinades. Taco seasonings. Relishes. Fats and Oils Butter, stick margarine, lard, shortening, ghee, and bacon fat. Coconut, palm kernel, or palm oils. Regular salad dressings. Other Pickles and olives. Salted popcorn and pretzels. The items listed above may not be a complete list of foods and beverages to avoid. Contact your dietitian for more information. WHERE CAN I FIND MORE INFORMATION? National Heart, Lung, and Blood Institute: www.nhlbi.nih.gov/health/health-topics/topics/dash/ Document Released: 10/05/2011 Document Revised: 03/02/2014 Document Reviewed: 08/20/2013 ExitCare Patient Information 2015 [employer]. This information is not intended to replace advice given to you by your health care provider. Make sure you discuss any questions you have with your health care provider. Hypertension Hypertension, commonly called high blood pressure, is when the force of blood pumping through your arteries is too strong. Your arteries are the blood vessels that carry blood from your heart throughout your body. A blood pressure reading consists of a higher number over a lower number, such as 110/72. The higher number (systolic) is the pressure inside your arteries when your heart pumps. The lower number (diastolic) is the pressure inside your arteries when your heart relaxes. Ideally you want your blood pressure below 120/80. Hypertension forces your heart to work harder to pump blood. Your arteries may become narrow or stiff. Having hypertension puts you at risk for heart disease, stroke, and other problems.  RISK  FACTORS Some risk factors for high blood pressure are controllable. Others are not.  Risk factors you cannot control include:   Race. You may be at higher risk if you are African American.  Age. Risk increases with age.  Gender. Men are at higher risk than women before age 45 years. After age 65, women are at higher risk than men. Risk factors you can control include:  Not getting enough exercise or physical activity.  Being overweight.  Getting too much fat, sugar, calories, or salt in your diet.  Drinking too much alcohol. SIGNS AND SYMPTOMS Hypertension does not usually cause signs or symptoms. Extremely high blood pressure (hypertensive crisis) may cause headache, anxiety, shortness of breath, and nosebleed. DIAGNOSIS  To check if you have hypertension, your health care provider will measure your blood pressure while you are seated, with your arm held at the level of your heart. It should be measured at least twice using the same arm. Certain conditions can cause a difference in blood pressure between your right and left arms. A blood pressure reading that is higher than normal on one occasion does not mean that you need treatment. If one blood pressure reading is high, ask your health care provider about having it checked again. TREATMENT  Treating high blood pressure includes making lifestyle changes and possibly taking medicine. Living a healthy lifestyle can help lower high blood pressure. You may need to change some of your habits. Lifestyle changes may include:  Following the DASH diet. This diet is high in fruits, vegetables, and whole grains. It is low in salt, red meat, and added sugars.  Getting at least 2 hours of brisk physical activity every week.  Losing weight if necessary.  Not smoking.  Limiting   alcoholic beverages.  Learning ways to reduce stress. If lifestyle changes are not enough to get your blood pressure under control, your health care provider may  prescribe medicine. You may need to take more than one. Work closely with your health care provider to understand the risks and benefits. HOME CARE INSTRUCTIONS  Have your blood pressure rechecked as directed by your health care provider.   Take medicines only as directed by your health care provider. Follow the directions carefully. Blood pressure medicines must be taken as prescribed. The medicine does not work as well when you skip doses. Skipping doses also puts you at risk for problems.   Do not smoke.   Monitor your blood pressure at home as directed by your health care provider. SEEK MEDICAL CARE IF:   You think you are having a reaction to medicines taken.  You have recurrent headaches or feel dizzy.  You have swelling in your ankles.  You have trouble with your vision. SEEK IMMEDIATE MEDICAL CARE IF:  You develop a severe headache or confusion.  You have unusual weakness, numbness, or feel faint.  You have severe chest or abdominal pain.  You vomit repeatedly.  You have trouble breathing. MAKE SURE YOU:   Understand these instructions.  Will watch your condition.  Will get help right away if you are not doing well or get worse. Document Released: 10/16/2005 Document Revised: 03/02/2014 Document Reviewed: 08/08/2013 ExitCare Patient Information 2015 [employer], [employer]. This information is not intended to replace advice given to you by your health care provider. Make sure you discuss any questions you have with your health care provider.  

## 2015-02-04 NOTE — Progress Notes (Signed)
Patient recently returned from [place] and is now back at work She had an episode of syncope in March where EMS was called and everything checked out-patient reports she had not eaten lunch that day and was very hungry and feels that was what happened Patient reports she is trying to eat healthy and walk more Patient needs refills

## 2015-02-04 NOTE — Progress Notes (Signed)
Patient ID: Alison Ward, female   DOB: 02-25-1961, 54 y.o.   MRN: [id number]   Alison Ward, is a 54 y.o. female  [id number]  [id number]  DOB - November 05, 1960  No chief complaint on file.       Subjective:   Alison Ward is a 54 y.o. female here today for a follow up visit. Patient has history of hypertension with poor compliance in the past, admitted on 07/06/2013 with headache as well as lethargy, blood pressure was found to be 202/98 in the emergency room, CT head shows 4.1 x 3.1 cm parietotemporal intracerebral infarction with surrounding edema and intraventricular extension of hemorrhage as well as 6 mm right-to-left midline shift and hydrocephalus. She underwent a right ventriculostomy and has since improved in symptoms and has been compliant with her medications. Her only medication is hydrochlorothiazide-lisinopril and potassium pills. She recently returned from the [place] and now back at work, she had an episode of syncope at work, EMS was activated and was checked out, likely due to hypoglycemia, she did not have to go to the emergency room. Patient has since been trying to eat healthy and on time. She is here today for follow-up and for medication refills. Patient has No headache, No chest pain, No abdominal pain - No Nausea, No new weakness tingling or numbness, No Cough - SOB.  No problems updated.  ALLERGIES: Allergies  Allergen Reactions  . Elavil [Amitriptyline]     Makes her crazy    PAST MEDICAL HISTORY: Past Medical History  Diagnosis Date  . Hypertension   . Heart murmur   . Stroke 07/05/2013  . Chronic headaches     daily, bitemporal, associated with tingling in face    MEDICATIONS AT HOME: Prior to Admission medications   Medication Sig Start Date End Date Taking? Authorizing Provider  ibuprofen (ADVIL,MOTRIN) 800 MG tablet Take 1 tablet (800 mg total) by mouth every 8 (eight) hours as needed for headache or mild pain. 07/16/14  Yes Tresa Garter, MD  lisinopril-hydrochlorothiazide (PRINZIDE,ZESTORETIC) 20-25 MG per tablet Take 1 tablet by mouth daily. 02/04/15  Yes Tresa Garter, MD  potassium chloride (K-DUR) 10 MEQ tablet Take 1 tablet (10 mEq total) by mouth daily. 02/04/15  Yes Tresa Garter, MD     Objective:   Filed Vitals:   02/04/15 1224  BP: 127/88  Pulse: 76  Temp: 98 F (36.7 C)  Resp: 16  Height: 5\' 8"  (1.727 m)  Weight: 247 lb (112.038 kg)  SpO2: 97%    Exam General appearance : Awake, alert, not in any distress. Speech Clear. Not toxic looking HEENT: Atraumatic and Normocephalic, pupils equally reactive to light and accomodation Neck: supple, no JVD. No cervical lymphadenopathy.  Chest:Good air entry bilaterally, no added sounds  CVS: S1 S2 regular, no murmurs.  Abdomen: Bowel sounds present, Non tender and not distended with no gaurding, rigidity or rebound. Extremities: B/L Lower Ext shows no edema, both legs are warm to touch Neurology: Awake alert, and oriented X 3, CN II-XII intact, Non focal Skin:No Rash  Data Review Lab Results  Component Value Date   HGBA1C 6.0* 07/06/2013     Assessment & Plan   1. Essential hypertension, benign  - lisinopril-hydrochlorothiazide (PRINZIDE,ZESTORETIC) 20-25 MG per tablet; Take 1 tablet by mouth daily.  Dispense: 90 tablet; Refill: 3 - potassium chloride (K-DUR) 10 MEQ tablet; Take 1 tablet (10 mEq total) by mouth daily.  Dispense: 90 tablet; Refill: 3  - We have discussed  target BP range and blood pressure goal - I have advised patient to check BP regularly and to call us back or report to clinic if the numbers are consistently higher than 140/90  - We discussed the importance of compliance with medical therapy and DASH diet recommended, consequences of uncontrolled hypertension discussed.  - continue current BP medications  Patient have been counseled extensively about nutrition and exercise  Return in about 6 months (around 08/06/2015)  for Follow up HTN, Blood Tests, Routine Follow Up.  The patient was given clear instructions to go to ER or return to medical center if symptoms don't improve, worsen or new problems develop. The patient verbalized understanding. The patient was told to call to get lab results if they haven't heard anything in the next week.   This note has been created with Surveyor, quantity. Any transcriptional errors are unintentional.    Angelica Chessman, MD, [place], [place], [place], [place] and [place], [place]   02/04/2015, 12:36 PM

## 2015-02-10 ENCOUNTER — Ambulatory Visit: Payer: Self-pay | Admitting: Nurse Practitioner

## 2015-02-11 ENCOUNTER — Ambulatory Visit: Payer: Self-pay | Admitting: Adult Health

## 2015-02-12 ENCOUNTER — Encounter: Payer: Self-pay | Admitting: Adult Health

## 2015-04-26 ENCOUNTER — Other Ambulatory Visit: Payer: Self-pay

## 2015-06-16 ENCOUNTER — Encounter: Payer: Self-pay | Admitting: Family Medicine

## 2015-06-16 ENCOUNTER — Ambulatory Visit: Payer: 59 | Attending: Family Medicine | Admitting: Family Medicine

## 2015-06-16 VITALS — BP 123/76 | HR 76 | Temp 98.3°F | Ht 68.0 in | Wt 254.0 lb

## 2015-06-16 DIAGNOSIS — I10 Essential (primary) hypertension: Secondary | ICD-10-CM | POA: Diagnosis not present

## 2015-06-16 DIAGNOSIS — R51 Headache: Secondary | ICD-10-CM | POA: Insufficient documentation

## 2015-06-16 DIAGNOSIS — R202 Paresthesia of skin: Secondary | ICD-10-CM | POA: Diagnosis not present

## 2015-06-16 DIAGNOSIS — G44209 Tension-type headache, unspecified, not intractable: Secondary | ICD-10-CM | POA: Diagnosis not present

## 2015-06-16 DIAGNOSIS — Z79899 Other long term (current) drug therapy: Secondary | ICD-10-CM | POA: Insufficient documentation

## 2015-06-16 DIAGNOSIS — Z8673 Personal history of transient ischemic attack (TIA), and cerebral infarction without residual deficits: Secondary | ICD-10-CM | POA: Insufficient documentation

## 2015-06-16 DIAGNOSIS — E876 Hypokalemia: Secondary | ICD-10-CM | POA: Diagnosis not present

## 2015-06-16 MED ORDER — GABAPENTIN 300 MG PO CAPS: 300.0000 mg | ORAL_CAPSULE | Freq: Two times a day (BID) | ORAL | Status: DC

## 2015-06-16 MED ORDER — BUTALBITAL-APAP-CAFF-COD 50-325-40-30 MG PO CAPS: 1.0000 | ORAL_CAPSULE | Freq: Four times a day (QID) | ORAL | Status: DC | PRN

## 2015-06-16 MED ORDER — LISINOPRIL-HYDROCHLOROTHIAZIDE 20-25 MG PO TABS: 1.0000 | ORAL_TABLET | Freq: Every day | ORAL | Status: DC

## 2015-06-16 NOTE — Progress Notes (Signed)
Subjective:    Patient ID: Alison Ward, female    DOB: 1961-04-05, 54 y.o.   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 Patient complains of headaches which started 2 weeks ago and occurred once on bilateral temples feeling like a tension radiating down the sides of her face and denies tearing, rhinorrhea. Sometimes lasts for 2 days; pain is 2/10 at this time. Denies nausea or vomiting. Needs refills on antihypertensives; also complains of left  lateral thigh numbness ever since she had her stroke and it sometimes has a burning sensation.  Past Medical History  Diagnosis Date  . Hypertension   . Heart murmur   . Stroke 07/05/2013  . Chronic headaches     daily, bitemporal, associated with tingling in face    Past Surgical History  Procedure Laterality Date  . Ventriculostomy Left 07/07/2013    Procedure: VENTRICULOSTOMY;  Surgeon: Ophelia Charter, MD;  Location: [facility];  Service: Neurosurgery;  Laterality: Left;  Left Ventriculostomy and Removal of Right Vetriculostomy    Social History   Social History  . Marital Status: Divorced    Spouse Name: N/A  . Number of Children: 3  . Years of Education: college   Occupational History  . [employer]    Social History Main Topics  . Smoking status: Never Smoker   . Smokeless tobacco: Never Used  . Alcohol Use: No  . Drug Use: No  . Sexual Activity: Not on file   Other Topics Concern  . Not on file   Social History Narrative    Allergies  Allergen Reactions  . Elavil [Amitriptyline]     Makes her crazy    Current Outpatient Prescriptions on File Prior to Visit  Medication Sig Dispense Refill  . ibuprofen (ADVIL,MOTRIN) 800 MG  tablet Take 1 tablet (800 mg total) by mouth every 8 (eight) hours as needed for headache or mild pain. 30 tablet 1  . potassium chloride (K-DUR) 10 MEQ tablet Take 1 tablet (10 mEq total) by mouth daily. 90 tablet 3   No current facility-administered medications on file prior to visit.       Review of Systems  Constitutional: Negative for activity change and appetite change.  HENT: Negative for sinus pressure and sore throat.   Respiratory: Negative for chest tightness, shortness of breath and wheezing.   Gastrointestinal: Negative for abdominal pain, constipation and abdominal distention.  Genitourinary: Negative.   Musculoskeletal: Negative.   Neurological: Positive for numbness (left lateral thigh) and headaches.  Psychiatric/Behavioral: Negative for behavioral problems and dysphoric mood.       Objective: Filed Vitals:   06/16/15 0909  BP: 123/76  Pulse: 76  Temp: 98.3 F (36.8 C)  Height: 5\' 8"  (1.727 m)  Weight: 254 lb (115.214 kg)  SpO2: 97%      Physical Exam  Constitutional: She is oriented to person, place, and time. She appears well-developed and well-nourished.  Cardiovascular: Normal rate, normal heart sounds and intact distal pulses.   No murmur heard. Pulmonary/Chest: Effort normal and breath sounds normal. She has no wheezes. She has no rales. She exhibits no tenderness.  Abdominal: Soft. Bowel sounds are normal. She exhibits no distension and no mass. There is no tenderness.  Musculoskeletal: Normal range  of motion.  Neurological: She is alert and oriented to person, place, and time.          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  Hypertension: Controlled. Continue antihypertensives. Basic metabolic panel today to assess renal function.  Paresthesia: Unknown etiology; could be residual from stroke Commenced on gabapentin, side effects discussed.  Hypokalemia: She has been taking potassium intermittently -advised to hold off on it and the repeat potassium was determine if she needs to be placed back on it.  Headaches: Tension in origin; cannot exclude sinus etiology. She has been advised to use antihistamines along with Flonase which she already has. Placed on Fioricet based on the fact that she just had one episode. If symptoms recur we may need to try Topamax again  This note has been created with Surveyor, quantity. Any transcriptional errors are unintentional.

## 2015-06-16 NOTE — Progress Notes (Signed)
Patient of Dr. Doreene Burke here to follow up on her HTN She states she has been having headaches that last for days  When they happen the pain is 10/10 and is in the frontal region and pain radiates down both sides of her face She needs 90 day refills on her medications

## 2015-06-16 NOTE — Patient Instructions (Signed)

## 2015-06-17 ENCOUNTER — Telehealth: Payer: Self-pay | Admitting: *Deleted

## 2015-06-17 LAB — BASIC METABOLIC PANEL
BUN: 12 mg/dL (ref 7–25)
CHLORIDE: 100 mmol/L (ref 98–110)
CO2: 30 mmol/L (ref 20–31)
Calcium: 9.4 mg/dL (ref 8.6–10.4)
Creat: 1.01 mg/dL (ref 0.50–1.05)
Glucose, Bld: 90 mg/dL (ref 65–99)
POTASSIUM: 3.6 mmol/L (ref 3.5–5.3)
Sodium: 140 mmol/L (ref 135–146)

## 2015-06-17 NOTE — Telephone Encounter (Signed)
-----   Message from Arnoldo Morale, MD sent at 06/17/2015  8:16 AM EDT ----- Please inform the patient that labs are normal. Thank you.

## 2015-06-17 NOTE — Telephone Encounter (Signed)
Left HIPAA compliant message for patient to return the call at [phone].

## 2015-06-22 ENCOUNTER — Telehealth: Payer: Self-pay | Admitting: Internal Medicine

## 2015-06-22 NOTE — Telephone Encounter (Signed)
Patient returned phone call from nurse in regards to blood work results, please f/u

## 2015-06-22 NOTE — Telephone Encounter (Signed)
Patient called to return phone call from nurse regarding results Please follow up.

## 2015-07-07 NOTE — Telephone Encounter (Signed)
-----   Message from Enobong Amao, MD sent at 06/17/2015  8:16 AM EDT ----- Please inform the patient that labs are normal. Thank you. 

## 2015-07-07 NOTE — Telephone Encounter (Signed)
Left HIPAA compliant message for patient to return my call. 

## 2015-07-08 ENCOUNTER — Ambulatory Visit: Payer: 59 | Attending: Internal Medicine | Admitting: Internal Medicine

## 2015-07-08 ENCOUNTER — Encounter: Payer: Self-pay | Admitting: Internal Medicine

## 2015-07-08 VITALS — BP 127/83 | HR 69 | Temp 97.9°F | Resp 18 | Ht 68.0 in | Wt 256.2 lb

## 2015-07-08 DIAGNOSIS — I639 Cerebral infarction, unspecified: Secondary | ICD-10-CM | POA: Diagnosis not present

## 2015-07-08 DIAGNOSIS — R7309 Other abnormal glucose: Secondary | ICD-10-CM | POA: Diagnosis not present

## 2015-07-08 DIAGNOSIS — I10 Essential (primary) hypertension: Secondary | ICD-10-CM | POA: Diagnosis not present

## 2015-07-08 DIAGNOSIS — Z79899 Other long term (current) drug therapy: Secondary | ICD-10-CM | POA: Insufficient documentation

## 2015-07-08 DIAGNOSIS — R51 Headache: Secondary | ICD-10-CM | POA: Insufficient documentation

## 2015-07-08 DIAGNOSIS — R7303 Prediabetes: Secondary | ICD-10-CM

## 2015-07-08 LAB — POCT GLYCOSYLATED HEMOGLOBIN (HGB A1C): Hemoglobin A1C: 5.8

## 2015-07-08 NOTE — Progress Notes (Signed)
Patient is here for follow-up of lab results from last visit. Patient denies pain at the moment. Patient does notice pain in left leg when exerting energy, and standing, walking, at varying levels. After about 10 minutes of standing, left leg becomes numb. Patient noticed the pain since she has been out of the hospital two years ago from the stroke. Patient fell off of a stool last July after syncope episode.

## 2015-07-08 NOTE — Patient Instructions (Signed)
DASH Eating Plan DASH stands for "Dietary Approaches to Stop Hypertension." The DASH eating plan is a healthy eating plan that has been shown to reduce high blood pressure (hypertension). Additional health benefits may include reducing the risk of type 2 diabetes mellitus, heart disease, and stroke. The DASH eating plan may also help with weight loss. WHAT DO I NEED TO KNOW ABOUT THE DASH EATING PLAN? For the DASH eating plan, you will follow these general guidelines:  Choose foods with a percent daily value for sodium of less than 5% (as listed on the food label).  Use salt-free seasonings or herbs instead of table salt or sea salt.  Check with your health care provider or pharmacist before using salt substitutes.  Eat lower-sodium products, often labeled as "lower sodium" or "no salt added."  Eat fresh foods.  Eat more vegetables, fruits, and low-fat dairy products.  Choose whole grains. Look for the word "whole" as the first word in the ingredient list.  Choose fish and skinless chicken or turkey more often than red meat. Limit fish, poultry, and meat to 6 oz (170 g) each day.  Limit sweets, desserts, sugars, and sugary drinks.  Choose heart-healthy fats.  Limit cheese to 1 oz (28 g) per day.  Eat more home-cooked food and less restaurant, buffet, and fast food.  Limit fried foods.  Cook foods using methods other than frying.  Limit canned vegetables. If you do use them, rinse them well to decrease the sodium.  When eating at a restaurant, ask that your food be prepared with less salt, or no salt if possible. WHAT FOODS CAN I EAT? Seek help from a dietitian for individual calorie needs. Grains Whole grain or whole wheat bread. Brown rice. Whole grain or whole wheat pasta. Quinoa, bulgur, and whole grain cereals. Low-sodium cereals. Corn or whole wheat flour tortillas. Whole grain cornbread. Whole grain crackers. Low-sodium crackers. Vegetables Fresh or frozen vegetables  (raw, steamed, roasted, or grilled). Low-sodium or reduced-sodium tomato and vegetable juices. Low-sodium or reduced-sodium tomato sauce and paste. Low-sodium or reduced-sodium canned vegetables.  Fruits All fresh, canned (in natural juice), or frozen fruits. Meat and Other Protein Products Ground beef (85% or leaner), grass-fed beef, or beef trimmed of fat. Skinless chicken or turkey. Ground chicken or turkey. Pork trimmed of fat. All fish and seafood. Eggs. Dried beans, peas, or lentils. Unsalted nuts and seeds. Unsalted canned beans. Dairy Low-fat dairy products, such as skim or 1% milk, 2% or reduced-fat cheeses, low-fat ricotta or cottage cheese, or plain low-fat yogurt. Low-sodium or reduced-sodium cheeses. Fats and Oils Tub margarines without trans fats. Light or reduced-fat mayonnaise and salad dressings (reduced sodium). Avocado. Safflower, olive, or canola oils. Natural peanut or almond butter. Other Unsalted popcorn and pretzels. The items listed above may not be a complete list of recommended foods or beverages. Contact your dietitian for more options. WHAT FOODS ARE NOT RECOMMENDED? Grains White bread. White pasta. White rice. Refined cornbread. Bagels and croissants. Crackers that contain trans fat. Vegetables Creamed or fried vegetables. Vegetables in a cheese sauce. Regular canned vegetables. Regular canned tomato sauce and paste. Regular tomato and vegetable juices. Fruits Dried fruits. Canned fruit in light or heavy syrup. Fruit juice. Meat and Other Protein Products Fatty cuts of meat. Ribs, chicken wings, bacon, sausage, bologna, salami, chitterlings, fatback, hot dogs, bratwurst, and packaged luncheon meats. Salted nuts and seeds. Canned beans with salt. Dairy Whole or 2% milk, cream, half-and-half, and cream cheese. Whole-fat or sweetened yogurt. Full-fat   cheeses or blue cheese. Nondairy creamers and whipped toppings. Processed cheese, cheese spreads, or cheese  curds. Condiments Onion and garlic salt, seasoned salt, table salt, and sea salt. Canned and packaged gravies. Worcestershire sauce. Tartar sauce. Barbecue sauce. Teriyaki sauce. Soy sauce, including reduced sodium. Steak sauce. Fish sauce. Oyster sauce. Cocktail sauce. Horseradish. Ketchup and mustard. Meat flavorings and tenderizers. Bouillon cubes. Hot sauce. Tabasco sauce. Marinades. Taco seasonings. Relishes. Fats and Oils Butter, stick margarine, lard, shortening, ghee, and bacon fat. Coconut, palm kernel, or palm oils. Regular salad dressings. Other Pickles and olives. Salted popcorn and pretzels. The items listed above may not be a complete list of foods and beverages to avoid. Contact your dietitian for more information. WHERE CAN I FIND MORE INFORMATION? National Heart, Lung, and Blood Institute: www.nhlbi.nih.gov/health/health-topics/topics/dash/ Document Released: 10/05/2011 Document Revised: 03/02/2014 Document Reviewed: 08/20/2013 ExitCare Patient Information 2015 [employer]. This information is not intended to replace advice given to you by your health care provider. Make sure you discuss any questions you have with your health care provider. Hypertension Hypertension, commonly called high blood pressure, is when the force of blood pumping through your arteries is too strong. Your arteries are the blood vessels that carry blood from your heart throughout your body. A blood pressure reading consists of a higher number over a lower number, such as 110/72. The higher number (systolic) is the pressure inside your arteries when your heart pumps. The lower number (diastolic) is the pressure inside your arteries when your heart relaxes. Ideally you want your blood pressure below 120/80. Hypertension forces your heart to work harder to pump blood. Your arteries may become narrow or stiff. Having hypertension puts you at risk for heart disease, stroke, and other problems.  RISK  FACTORS Some risk factors for high blood pressure are controllable. Others are not.  Risk factors you cannot control include:   Race. You may be at higher risk if you are African American.  Age. Risk increases with age.  Gender. Men are at higher risk than women before age 45 years. After age 65, women are at higher risk than men. Risk factors you can control include:  Not getting enough exercise or physical activity.  Being overweight.  Getting too much fat, sugar, calories, or salt in your diet.  Drinking too much alcohol. SIGNS AND SYMPTOMS Hypertension does not usually cause signs or symptoms. Extremely high blood pressure (hypertensive crisis) may cause headache, anxiety, shortness of breath, and nosebleed. DIAGNOSIS  To check if you have hypertension, your health care provider will measure your blood pressure while you are seated, with your arm held at the level of your heart. It should be measured at least twice using the same arm. Certain conditions can cause a difference in blood pressure between your right and left arms. A blood pressure reading that is higher than normal on one occasion does not mean that you need treatment. If one blood pressure reading is high, ask your health care provider about having it checked again. TREATMENT  Treating high blood pressure includes making lifestyle changes and possibly taking medicine. Living a healthy lifestyle can help lower high blood pressure. You may need to change some of your habits. Lifestyle changes may include:  Following the DASH diet. This diet is high in fruits, vegetables, and whole grains. It is low in salt, red meat, and added sugars.  Getting at least 2 hours of brisk physical activity every week.  Losing weight if necessary.  Not smoking.  Limiting   alcoholic beverages.  Learning ways to reduce stress. If lifestyle changes are not enough to get your blood pressure under control, your health care provider may  prescribe medicine. You may need to take more than one. Work closely with your health care provider to understand the risks and benefits. HOME CARE INSTRUCTIONS  Have your blood pressure rechecked as directed by your health care provider.   Take medicines only as directed by your health care provider. Follow the directions carefully. Blood pressure medicines must be taken as prescribed. The medicine does not work as well when you skip doses. Skipping doses also puts you at risk for problems.   Do not smoke.   Monitor your blood pressure at home as directed by your health care provider. SEEK MEDICAL CARE IF:   You think you are having a reaction to medicines taken.  You have recurrent headaches or feel dizzy.  You have swelling in your ankles.  You have trouble with your vision. SEEK IMMEDIATE MEDICAL CARE IF:  You develop a severe headache or confusion.  You have unusual weakness, numbness, or feel faint.  You have severe chest or abdominal pain.  You vomit repeatedly.  You have trouble breathing. MAKE SURE YOU:   Understand these instructions.  Will watch your condition.  Will get help right away if you are not doing well or get worse. Document Released: 10/16/2005 Document Revised: 03/02/2014 Document Reviewed: 08/08/2013 ExitCare Patient Information 2015 [employer], [employer]. This information is not intended to replace advice given to you by your health care provider. Make sure you discuss any questions you have with your health care provider.  

## 2015-07-08 NOTE — Progress Notes (Signed)
Patient ID: Alison Ward, female   DOB: Dec 23, 1960, 54 y.o.   MRN: [id number]   Alison Ward, is a 54 y.o. female  [id number]  [id number]  DOB - [birth year]  Chief Complaint  Patient presents with  . Follow-up        Subjective:   Alison Ward is a 54 y.o. female here today for a follow up visit. Patient is here for follow-up of lab results from last visit. Patient has history of hypertension with poor compliance in the past, admitted on 07/06/2013 with headache as well as lethargy, blood pressure was found to be 202/98 in the emergency room, CT head shows 4.1 x 3.1 cm parietotemporal intracerebral infarction with surrounding edema and intraventricular extension of hemorrhage as well as 6 mm right-to-left midline shift and hydrocephalus. She underwent a right ventriculostomy and has since improved in symptoms and has been compliant with her medications. Her only medication is hctz-lisinopril, fioricet and potassium pills. Patient does notice pain in left leg when exerting energy, and standing, walking, at varying levels. After about 10 minutes of standing, left leg becomes numb. Patient noticed the pain since she has been out of the hospital two years ago from the stroke. Patient fell off of a stool last July after syncope episode Patient has No headache, No chest pain, No abdominal pain - No Nausea, No Cough - SOB.  No problems updated.  ALLERGIES: Allergies  Allergen Reactions  . Elavil [Amitriptyline]     Makes her crazy    PAST MEDICAL HISTORY: Past Medical History  Diagnosis Date  . Hypertension   . Heart murmur   . Stroke 07/05/2013  . Chronic headaches     daily, bitemporal, associated with tingling in face    MEDICATIONS AT HOME: Prior to Admission medications   Medication Sig Start Date End Date Taking? Authorizing Provider  gabapentin (NEURONTIN) 300 MG capsule Take 1 capsule (300 mg total) by mouth 2 (two) times daily. 06/16/15  Yes Arnoldo Morale, MD    ibuprofen (ADVIL,MOTRIN) 800 MG tablet Take 1 tablet (800 mg total) by mouth every 8 (eight) hours as needed for headache or mild pain. 07/16/14  Yes Tresa Garter, MD  lisinopril-hydrochlorothiazide (PRINZIDE,ZESTORETIC) 20-25 MG per tablet Take 1 tablet by mouth daily. 06/16/15  Yes Arnoldo Morale, MD  butalbital-acetaminophen-caffeine (FIORICET/CODEINE) 50-325-40-30 MG per capsule Take 1 capsule by mouth every 6 (six) hours as needed for headache. Patient not taking: Reported on 07/08/2015 06/16/15   Arnoldo Morale, MD  potassium chloride (K-DUR) 10 MEQ tablet Take 1 tablet (10 mEq total) by mouth daily. Patient not taking: Reported on 07/08/2015 02/04/15   Tresa Garter, MD     Objective:   Filed Vitals:   07/08/15 1614  BP: 127/83  Pulse: 69  Temp: 97.9 F (36.6 C)  TempSrc: Oral  Resp: 18  Height: 5\' 8"  (1.727 m)  Weight: 256 lb 3.2 oz (116.212 kg)  SpO2: 98%    Exam General appearance: Awake, alert, not in any distress. Speech Clear. Not toxic looking HEENT: Atraumatic and Normocephalic, pupils equally reactive to light and accomodation Neck: supple, no JVD. No cervical lymphadenopathy.  Chest:Good air entry bilaterally, no added sounds  CVS: S1 S2 regular, no murmurs.  Abdomen: Bowel sounds present, Non tender and not distended with no gaurding, rigidity or rebound. Extremities: B/L Lower Ext shows no edema, both legs are warm to touch Neurology: Awake alert, and oriented X 3, CN II-XII intact, Non focal Skin: No Rash  Data  Review Lab Results  Component Value Date   HGBA1C 6.0* 07/06/2013     Assessment & Plan   1. Essential hypertension, benign  We have discussed target BP range and blood pressure goal. I have advised patient to check BP regularly and to call us back or report to clinic if the numbers are consistently higher than 140/90. We discussed the importance of compliance with medical therapy and DASH diet recommended, consequences of uncontrolled  hypertension discussed.  - continue current BP medications  2. CVA (cerebral vascular accident) Continue current medications and follow up with Neurologists. Lab result reviewed and discussed with patient. Patient verbalized understanding. Patient have been counseled extensively about nutrition and exercise  Return in about 3 months (around 10/07/2015), or if symptoms worsen or fail to improve, for Follow up HTN, Routine Follow Up.  The patient was given clear instructions to go to ER or return to medical center if symptoms don't improve, worsen or new problems develop. The patient verbalized understanding. The patient was told to call to get lab results if they haven't heard anything in the next week.   This note has been created with Surveyor, quantity. Any transcriptional errors are unintentional.    Angelica Chessman, MD, [place], [place], [place] and [place], [place]   07/08/2015, 4:58 PM

## 2015-08-24 ENCOUNTER — Telehealth: Payer: Self-pay

## 2015-08-24 NOTE — Telephone Encounter (Signed)
Nurse attempted to call patient twice with normal lab results in August. Patient was seen on 08/24/15. Results encounter will be closed.

## 2015-09-02 ENCOUNTER — Encounter: Payer: Self-pay | Admitting: Internal Medicine

## 2015-09-02 ENCOUNTER — Ambulatory Visit: Payer: 59 | Attending: Internal Medicine | Admitting: Internal Medicine

## 2015-09-02 VITALS — BP 132/86 | HR 64 | Temp 98.2°F | Resp 18 | Ht 67.0 in | Wt 258.0 lb

## 2015-09-02 DIAGNOSIS — G44209 Tension-type headache, unspecified, not intractable: Secondary | ICD-10-CM | POA: Diagnosis not present

## 2015-09-02 DIAGNOSIS — Z79899 Other long term (current) drug therapy: Secondary | ICD-10-CM | POA: Insufficient documentation

## 2015-09-02 DIAGNOSIS — I10 Essential (primary) hypertension: Secondary | ICD-10-CM | POA: Diagnosis not present

## 2015-09-02 DIAGNOSIS — Z8673 Personal history of transient ischemic attack (TIA), and cerebral infarction without residual deficits: Secondary | ICD-10-CM | POA: Insufficient documentation

## 2015-09-02 DIAGNOSIS — R7303 Prediabetes: Secondary | ICD-10-CM | POA: Diagnosis not present

## 2015-09-02 MED ORDER — ACETAMINOPHEN-CODEINE #3 300-30 MG PO TABS: 1.0000 | ORAL_TABLET | ORAL | Status: DC | PRN

## 2015-09-02 NOTE — Progress Notes (Signed)
Patient complains of HA's beginning the end of September.  Pain radiates to sides of patients face. Patient states her memory and concentration becomes off.   Patient states gabapentin makes her drowsy and faint.  Patient does not want flu shot

## 2015-09-02 NOTE — Progress Notes (Signed)
Patient ID: Alison Ward, female   DOB: 03-Apr-1961, 54 y.o.   MRN: [id number]   Alison Ward, is a 54 y.o. female  [id number]:[id number]  [id number]  DOB - 08/11/1961  Chief Complaint  Patient presents with  . Headache        Subjective:   Alison Ward is a 54 y.o. 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 Patient complains of headaches that started again since the end of September radiates to sides of face. Patient states her memory and concentration is somewhat off since onset of current headache episode. Patient denies any visual symptoms or focal neurological symptoms accompanying her headaches. She has not had any recent brain imaging study. She denies any neck pain and raducular pain but does complain of mild tightness of the neck and shoulder muscles. Patient states gabapentin makes her drowsy. She denies chest pain, No abdominal pain, No Nausea, No new weakness tingling or numbness, No Cough.  Problem  Prediabetes    ALLERGIES: Allergies  Allergen Reactions  . Elavil [Amitriptyline]     Makes her crazy    PAST MEDICAL HISTORY: Past Medical History  Diagnosis Date  . Hypertension   . Heart murmur   . Stroke ([place]) 07/05/2013  . Chronic headaches     daily, bitemporal, associated with tingling in face    MEDICATIONS AT HOME: Prior to Admission medications   Medication Sig Start Date End Date Taking? Authorizing Provider  ibuprofen (ADVIL,MOTRIN) 800 MG tablet Take 1 tablet (800 mg total) by mouth every 8 (eight) hours as needed for headache or mild pain. 07/16/14  Yes Tresa Garter, MD  lisinopril-hydrochlorothiazide (PRINZIDE,ZESTORETIC) 20-25 MG per tablet Take 1 tablet by mouth daily. 06/16/15  Yes Arnoldo Morale, MD  acetaminophen-codeine (TYLENOL #3) 300-30 MG tablet Take 1 tablet by mouth every 4 (four)  hours as needed. 09/02/15   Tresa Garter, MD  butalbital-acetaminophen-caffeine (FIORICET/CODEINE) 50-325-40-30 MG per capsule Take 1 capsule by mouth every 6 (six) hours as needed for headache. Patient not taking: Reported on 07/08/2015 06/16/15   Arnoldo Morale, MD  gabapentin (NEURONTIN) 300 MG capsule Take 1 capsule (300 mg total) by mouth 2 (two) times daily. Patient not taking: Reported on 09/02/2015 06/16/15   Arnoldo Morale, MD  potassium chloride (K-DUR) 10 MEQ tablet Take 1 tablet (10 mEq total) by mouth daily. Patient not taking: Reported on 09/02/2015 02/04/15   Tresa Garter, MD     Objective:   Filed Vitals:   09/02/15 1154  BP: 132/86  Pulse: 64  Temp: 98.2 F (36.8 C)  TempSrc: Oral  Resp: 18  Height: 5\' 7"  (1.702 m)  Weight: 258 lb (117.028 kg)  SpO2: 98%    Exam General appearance : Awake, alert, not in any distress. Speech Clear. Not toxic looking HEENT: Atraumatic and Normocephalic, pupils equally reactive to light and accomodation Neck: supple, no JVD. No cervical lymphadenopathy.  Chest:Good air entry bilaterally, no added sounds  CVS: S1 S2 regular, no murmurs.  Abdomen: Bowel sounds present, Non tender and not distended with no gaurding, rigidity or rebound. Extremities: B/L Lower Ext shows no edema, both legs are warm to touch Neurology: Awake alert, and oriented X 3, CN II-XII intact, Non focal Skin: No Rash  Data Review Lab Results  Component Value Date   HGBA1C 5.80 07/08/2015   HGBA1C 6.0* 07/06/2013  Assessment & Plan   1. Essential hypertension, benign  We have discussed target BP range and blood pressure goal. I have advised patient to check BP regularly and to call us back or report to clinic if the numbers are consistently higher than 140/90. We discussed the importance of compliance with medical therapy and DASH diet recommended, consequences of uncontrolled hypertension discussed.   - continue current BP medications  2.  Tension headache  - acetaminophen-codeine (TYLENOL #3) 300-30 MG tablet; Take 1 tablet by mouth every 4 (four) hours as needed.  Dispense: 60 tablet; Refill: 0  - Ambulatory referral to Neurology - Urgent  3. Prediabetes  Aim for 30 minutes of exercise most days. Rethink what you drink. Water is great! Aim for 2-3 Carb Choices per meal (30-45 grams) +/- 1 either way  Aim for 0-15 Carbs per snack if hungry  Include protein in moderation with your meals and snacks  Consider reading food labels for Total Carbohydrate and Fat Grams of foods  Consider checking BG at alternate times per day  Continue taking medication as directed Be mindful about how much sugar you are adding to beverages and other foods. Fruit Punch - find one with no sugar  Measure and decrease portions of carbohydrate foods  Make your plate and don't go back for seconds  Patient have been counseled extensively about nutrition and exercise  Return in about 3 months (around 12/03/2015), or if symptoms worsen or fail to improve, for Follow up HTN, Follow up Pain and comorbidities.  The patient was given clear instructions to go to ER or return to medical center if symptoms don't improve, worsen or new problems develop. The patient verbalized understanding. The patient was told to call to get lab results if they haven't heard anything in the next week.   This note has been created with Surveyor, quantity. Any transcriptional errors are unintentional.    Angelica Chessman, MD, [place], [place], [place], [place] and [place], [place]   09/02/2015, 12:34 PM

## 2015-09-02 NOTE — Patient Instructions (Signed)

## 2015-09-07 ENCOUNTER — Ambulatory Visit (INDEPENDENT_AMBULATORY_CARE_PROVIDER_SITE_OTHER): Payer: 59 | Admitting: Neurology

## 2015-09-07 ENCOUNTER — Encounter: Payer: Self-pay | Admitting: Neurology

## 2015-09-07 VITALS — BP 114/77 | HR 75 | Ht 68.0 in | Wt 261.2 lb

## 2015-09-07 DIAGNOSIS — G44209 Tension-type headache, unspecified, not intractable: Secondary | ICD-10-CM | POA: Insufficient documentation

## 2015-09-07 DIAGNOSIS — G5712 Meralgia paresthetica, left lower limb: Secondary | ICD-10-CM | POA: Diagnosis not present

## 2015-09-07 DIAGNOSIS — R55 Syncope and collapse: Secondary | ICD-10-CM | POA: Diagnosis not present

## 2015-09-07 MED ORDER — TIZANIDINE HCL 2 MG PO CAPS: 2.0000 mg | ORAL_CAPSULE | Freq: Two times a day (BID) | ORAL | Status: DC

## 2015-09-07 MED ORDER — PREGABALIN 50 MG PO CAPS: 50.0000 mg | ORAL_CAPSULE | Freq: Two times a day (BID) | ORAL | Status: DC

## 2015-09-07 NOTE — Patient Instructions (Addendum)
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 I recommend trial of Zanaflex 2 mg at night to be increase if tolerated after 2 weeks to twice daily. I have discussed possible side effects with the patient advised to call me if needed. I have also encouraged her to do daily neck stretching exercises as well as increased participation in regular activities for stress relaxation-like exercise, swimming, medication and yoga. Check MRI scan of the brain as well as lumbar spine and trial of Lyrica 50 mg twice daily for her neuropathic pain in the left thigh. She will return for follow-up in 2 months or call earlier if necessary.  Tension Headache A tension headache is a feeling of pain, pressure, or aching that is often felt over the front and sides of the head. The pain can be dull, or it can feel tight (constricting). Tension headaches are not normally associated with nausea or vomiting, and they do not get worse with physical activity. Tension headaches can last from 30 minutes to several days. This is the most common type of headache. CAUSES The exact cause of this condition is not known. Tension headaches often begin after stress, anxiety, or depression. Other triggers may include:  Alcohol.  Too much caffeine, or caffeine withdrawal.  Respiratory infections, such as colds, flu, or sinus infections.  Dental problems or teeth clenching.  Fatigue.  Holding your head and neck in the same position for a long period of time, such as while using a computer.  Smoking. SYMPTOMS Symptoms of this condition include:  A feeling of pressure around the head.  Dull, aching head pain.  Pain felt over the front and sides of the head.  Tenderness in the muscles of the head, neck, and shoulders. DIAGNOSIS This condition may be diagnosed based on your symptoms and a physical exam. Tests may be done, such  as a CT scan or an MRI of your head. These tests may be done if your symptoms are severe or unusual. TREATMENT This condition may be treated with lifestyle changes and medicines to help relieve symptoms. HOME CARE INSTRUCTIONS Managing Pain  Take over-the-counter and prescription medicines only as told by your health care provider.  Lie down in a dark, quiet room when you have a headache.  If directed, apply ice to the head and neck area:  Put ice in a plastic bag.  Place a towel between your skin and the bag.  Leave the ice on for 20 minutes, 2-3 times per day.  Use a heating pad or a hot shower to apply heat to the head and neck area as told by your health care provider. Eating and Drinking  Eat meals on a regular schedule.  Limit alcohol use.  Decrease your caffeine intake, or stop using caffeine. General Instructions  Keep all follow-up visits as told by your health care provider. This is important.  Keep a headache journal to help find out what may trigger your headaches. For example, write down:  What you eat and drink.  How much sleep you get.  Any change to your diet or medicines.  Try massage or other relaxation techniques.  Limit stress.  Sit up straight, and avoid tensing your muscles.  Do not use tobacco products, including cigarettes, chewing tobacco, or e-cigarettes. If you need help quitting, ask your health care provider.  Exercise regularly as told by your health care provider.  Get  7-9 hours of sleep, or the amount recommended by your health care provider. SEEK MEDICAL CARE IF:  Your symptoms are not helped by medicine.  You have a headache that is different from what you normally experience.  You have nausea or you vomit.  You have a fever. SEEK IMMEDIATE MEDICAL CARE IF:  Your headache becomes severe.  You have repeated vomiting.  You have a stiff neck.  You have a loss of vision.  You have problems with speech.  You have pain  in your eye or ear.  You have muscular weakness or loss of muscle control.  You lose your balance or you have trouble walking.  You feel faint or you pass out.  You have confusion.   This information is not intended to replace advice given to you by your health care provider. Make sure you discuss any questions you have with your health care provider.   Document Released: 10/16/2005 Document Revised: 07/07/2015 Document Reviewed: 02/08/2015 Elsevier Interactive Patient Education [employer].

## 2015-09-07 NOTE — Progress Notes (Signed)
[facility] [address] [place]. 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 She states she was sitting on a stool when she fainted and fell off and hit the back of her head. She did not lose consciousness for long and did not have any focal symptoms. EMS were called but patient chose not to go to the hospital and seek any help. Since then she's been having headaches which were initially mild and responded to Tylenol and ibuprofen but for the last couple of months the headaches are now occurring on a daily basis. She describes the headaches as variable from mild dull headaches to severe throbbing headaches which vary in severity from 2/10-7/10.Headache is not accompanied by nausea, vomiting or photophobia but she is bothered by loud sounds. Headaches do increase with physical activity and stress and are relieved by sleep and rest. She has been taking ibuprofen 800 mg once or twice a day with only partial relief. She has recently been given a prescription of Tylenol with Codeine but she has not started taking it yet. Patient denies any visual symptoms or focal neurological symptoms accompanying her headaches. She has not had any recent brain imaging study. She denies any neck pain and raducular pain but does complain of mild tightness of the neck and shoulder muscles. 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 She required ventriculostomy at that time. She did recover from that quite well but did have  headaches which lasted for 6 months to year and then gradually  the headaches disappeared and she did quite well for nearly a year without any headaches. She was tried on a variety of headache medications at that time including Depakote, Topamax, amitriptyline but these did not work or she had side effects. Tizanidine probably helped her the most. She also has a new complaint now of left leg numbness which she describes as tingling sensation involving the anterior and lateral   aspect of her left thigh. This also began since her fall. She has seen her primary physician who has prescribed gabapentin which she takes 300mg  twice daily but it makes her dizzy and does not particularly help her numbness. She denies any severe back pain or radicular pain or trouble with walking, balance or bladder control. She has not had any back x-rays or MRI.  ROS:   14 system review of systems is positive for  blurred vision, aching muscles, memory loss, confusion, headache, numbness, weakness, slurred speech, dizziness, passing out and all other systems negative PMH:  Past Medical History  Diagnosis Date  . Hypertension   . Heart murmur   . Stroke ([place]) 07/05/2013  . Chronic headaches     daily, bitemporal, associated with tingling in face    Social History:  Social History   Social History  . Marital Status: Divorced    Spouse Name: N/A  . Number of Children: 3  . Years of Education: college   Occupational History  . [employer]    Social History Main Topics  . Smoking status:  Never Smoker   . Smokeless tobacco: Never Used  . Alcohol Use: No  . Drug Use: No  . Sexual Activity: Not on file   Other Topics Concern  . Not on file   Social History Narrative    Medications:   Current Outpatient Prescriptions on File Prior to Visit  Medication Sig Dispense Refill  . acetaminophen-codeine (TYLENOL #3) 300-30 MG tablet Take 1 tablet by mouth every 4 (four) hours as needed. 60 tablet 0  .  butalbital-acetaminophen-caffeine (FIORICET/CODEINE) 50-325-40-30 MG per capsule Take 1 capsule by mouth every 6 (six) hours as needed for headache. 30 capsule 0  . gabapentin (NEURONTIN) 300 MG capsule Take 1 capsule (300 mg total) by mouth 2 (two) times daily. 60 capsule 2  . ibuprofen (ADVIL,MOTRIN) 800 MG tablet Take 1 tablet (800 mg total) by mouth every 8 (eight) hours as needed for headache or mild pain. 30 tablet 1  . lisinopril-hydrochlorothiazide (PRINZIDE,ZESTORETIC) 20-25 MG per tablet Take 1 tablet by mouth daily. 90 tablet 1   No current facility-administered medications on file prior to visit.    Allergies:   Allergies  Allergen Reactions  . Elavil [Amitriptyline]     Makes her crazy    Physical Exam General: Obese middle-aged African-American lady, seated, in no evident distress Head: head normocephalic and atraumatic.   Neck: supple with no carotid or supraclavicular bruits Cardiovascular: regular rate and rhythm, no murmurs Musculoskeletal: no deformity Skin:  no rash/petichiae Vascular:  Normal pulses all extremities  Neurologic Exam Mental Status: Awake and fully alert. Oriented to place and time. Recent and remote memory intact. Attention span, concentration and fund of knowledge appropriate. Mood and affect appropriate. Recall 3/3. Animal naming test 9. Cranial Nerves: Fundoscopic exam reveals sharp disc margins. Pupils equal, briskly reactive to light. Extraocular movements full without nystagmus. Visual fields full to confrontation. Hearing intact. Facial sensation intact. Face, tongue, palate moves normally and symmetrically.  Motor: Normal bulk and tone. Normal strength in all tested extremity muscles. Diminished fine finger movements on the left. Orbits right over left approximately. Minimum action tremor of the left outstretched approximately. Absent tremor at rest. Sensory.: intact  position and vibratory sensation. Subjective decrease touch and pinprick  sensation in the left thigh on the anterior lateral aspect of the knee. Coordination: Rapid alternating movements normal in all extremities. Finger-to-nose and heel-to-shin performed accurately bilaterally. Gait and Station: Arises from chair without difficulty. Stance is normal. Gait demonstrates normal stride length and balance . Able to heel, toe and tandem walk without difficulty.  Reflexes: 1+ and symmetric. Toes downgoing.       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 I recommend trial of Zanaflex 2 mg at night to be increase if tolerated after 2 weeks to twice daily. I have discussed possible side effects with the patient advised to call me if needed. I have also encouraged her to do daily neck stretching exercises as well as increased participation in regular activities for stress relaxation-like exercise, swimming, medication and yoga. Check MRI scan of the brain as well as lumbar spine and trial of Lyrica 50 mg twice daily for her neuropathic pain in the left thigh. She  will return for follow-up in 2 months or call earlier if necessary. Antony Contras, MD  Note: This document was prepared with digital dictation and possible smart phrase technology. Any transcriptional errors that result from this process are unintentional.

## 2015-09-20 ENCOUNTER — Telehealth: Payer: Self-pay | Admitting: Neurology

## 2015-09-20 NOTE — Telephone Encounter (Signed)
Pt called sts she had MRI and EEG. She went to work on Thursday but could not work on Fridayas the [place] was worse. She has not picked up RX for muscle relaxer but has been taking gabapentin until then. Her sister should pick up RX today. She sts the weekend was miserable with no relief for HA. She is out of work again today. Please call and advise

## 2015-09-20 NOTE — Telephone Encounter (Signed)
I called the patient and reinforced the need to try  Zanaflex which she states she will do today. She was advised to call back if there was no relief

## 2015-09-20 NOTE — Telephone Encounter (Signed)
Rn call patient back about her headache issues. Pt stated she was unable to get the tizanidine (ZANAFLEX) 2 MG capsule because the pharmacy closes at 0600pm. Also she is unable to drive to. She will ask her sister to pick up the medicine before 6pm. Pt is taking the lyrica for her leg issues. Pt stated her headache is getting worse and could not work today. Rn explain that it was prescribe 09-07-15,  For her headaches and to increase per Dr. Leonie Man instructions. Pt stated she will try to get someone to pick up the zanaflex today because that has help her headaches in the past. Rn explain Dr.Sethi will advise her on this message. Best number to cal her is [phone] (H).

## 2015-09-25 ENCOUNTER — Observation Stay (HOSPITAL_COMMUNITY): Payer: 59

## 2015-09-25 ENCOUNTER — Encounter (HOSPITAL_COMMUNITY): Payer: Self-pay | Admitting: Emergency Medicine

## 2015-09-25 ENCOUNTER — Observation Stay (HOSPITAL_COMMUNITY)
Admission: EM | Admit: 2015-09-25 | Discharge: 2015-09-26 | Disposition: A | Discharge status: Home / Self Care | Payer: 59 | Attending: Internal Medicine | Admitting: Internal Medicine

## 2015-09-25 ENCOUNTER — Emergency Department (HOSPITAL_COMMUNITY): Payer: 59

## 2015-09-25 DIAGNOSIS — I10 Essential (primary) hypertension: Secondary | ICD-10-CM | POA: Diagnosis not present

## 2015-09-25 DIAGNOSIS — E669 Obesity, unspecified: Secondary | ICD-10-CM | POA: Diagnosis not present

## 2015-09-25 DIAGNOSIS — R200 Anesthesia of skin: Secondary | ICD-10-CM | POA: Diagnosis not present

## 2015-09-25 DIAGNOSIS — Z8673 Personal history of transient ischemic attack (TIA), and cerebral infarction without residual deficits: Secondary | ICD-10-CM | POA: Diagnosis not present

## 2015-09-25 DIAGNOSIS — D72819 Decreased white blood cell count, unspecified: Secondary | ICD-10-CM | POA: Diagnosis not present

## 2015-09-25 DIAGNOSIS — G5712 Meralgia paresthetica, left lower limb: Secondary | ICD-10-CM | POA: Diagnosis not present

## 2015-09-25 DIAGNOSIS — R51 Headache: Secondary | ICD-10-CM | POA: Diagnosis not present

## 2015-09-25 DIAGNOSIS — G459 Transient cerebral ischemic attack, unspecified: Principal | ICD-10-CM | POA: Insufficient documentation

## 2015-09-25 DIAGNOSIS — Z79899 Other long term (current) drug therapy: Secondary | ICD-10-CM | POA: Insufficient documentation

## 2015-09-25 DIAGNOSIS — N179 Acute kidney failure, unspecified: Secondary | ICD-10-CM

## 2015-09-25 DIAGNOSIS — Z6838 Body mass index (BMI) 38.0-38.9, adult: Secondary | ICD-10-CM | POA: Diagnosis not present

## 2015-09-25 DIAGNOSIS — R29898 Other symptoms and signs involving the musculoskeletal system: Secondary | ICD-10-CM

## 2015-09-25 DIAGNOSIS — R7303 Prediabetes: Secondary | ICD-10-CM | POA: Diagnosis present

## 2015-09-25 LAB — URINALYSIS, ROUTINE W REFLEX MICROSCOPIC
Bilirubin Urine: NEGATIVE
GLUCOSE, UA: NEGATIVE mg/dL
Hgb urine dipstick: NEGATIVE
Ketones, ur: NEGATIVE mg/dL
LEUKOCYTES UA: NEGATIVE
NITRITE: NEGATIVE
PH: 6 (ref 5.0–8.0)
Protein, ur: NEGATIVE mg/dL
SPECIFIC GRAVITY, URINE: 1.008 (ref 1.005–1.030)

## 2015-09-25 LAB — CBC WITH DIFFERENTIAL/PLATELET
BASOS ABS: 0 10*3/uL (ref 0.0–0.1)
BASOS PCT: 1 %
EOS PCT: 4 %
Eosinophils Absolute: 0.1 10*3/uL (ref 0.0–0.7)
HEMATOCRIT: 38.6 % (ref 36.0–46.0)
Hemoglobin: 12.3 g/dL (ref 12.0–15.0)
Lymphocytes Relative: 50 %
Lymphs Abs: 1.9 10*3/uL (ref 0.7–4.0)
MCH: 28.3 pg (ref 26.0–34.0)
MCHC: 31.9 g/dL (ref 30.0–36.0)
MCV: 88.9 fL (ref 78.0–100.0)
MONO ABS: 0.3 10*3/uL (ref 0.1–1.0)
MONOS PCT: 7 %
Neutro Abs: 1.4 10*3/uL — ABNORMAL LOW (ref 1.7–7.7)
Neutrophils Relative %: 38 %
Platelets: 240 10*3/uL (ref 150–400)
RBC: 4.34 MIL/uL (ref 3.87–5.11)
RDW: 15.6 % — ABNORMAL HIGH (ref 11.5–15.5)
WBC: 3.7 10*3/uL — ABNORMAL LOW (ref 4.0–10.5)

## 2015-09-25 LAB — BASIC METABOLIC PANEL
Anion gap: 6 (ref 5–15)
BUN: 15 mg/dL (ref 6–20)
CALCIUM: 9.8 mg/dL (ref 8.9–10.3)
CO2: 33 mmol/L — AB (ref 22–32)
Chloride: 102 mmol/L (ref 101–111)
Creatinine, Ser: 1.03 mg/dL — ABNORMAL HIGH (ref 0.44–1.00)
GFR calc Af Amer: >60 mL/min (ref >60)
GFR, EST NON AFRICAN AMERICAN: 60 mL/min — AB (ref >60)
GLUCOSE: 94 mg/dL (ref 65–99)
Potassium: 3.5 mmol/L (ref 3.5–5.1)
Sodium: 141 mmol/L (ref 135–145)

## 2015-09-25 MED ORDER — ENOXAPARIN SODIUM 40 MG/0.4ML ~~LOC~~ SOLN
40.0000 mg | SUBCUTANEOUS | Status: DC
  Filled 2015-09-25: qty 0.4

## 2015-09-25 MED ORDER — LISINOPRIL-HYDROCHLOROTHIAZIDE 20-12.5 MG PO TABS: 1.0000 | ORAL_TABLET | Freq: Every day | ORAL | Status: DC

## 2015-09-25 MED ORDER — GABAPENTIN 300 MG PO CAPS: 300.0000 mg | ORAL_CAPSULE | Freq: Two times a day (BID) | ORAL | Status: DC

## 2015-09-25 MED ORDER — STROKE: EARLY STAGES OF RECOVERY BOOK
Freq: Once | Status: AC
  Administered 2015-09-25: 1
  Filled 2015-09-25: qty 1

## 2015-09-25 MED ORDER — HYDROCHLOROTHIAZIDE 12.5 MG PO CAPS
12.5000 mg | ORAL_CAPSULE | Freq: Every day | ORAL | Status: DC
  Administered 2015-09-26: 12.5 mg via ORAL
  Filled 2015-09-25: qty 1

## 2015-09-25 MED ORDER — LISINOPRIL 20 MG PO TABS
20.0000 mg | ORAL_TABLET | Freq: Every day | ORAL | Status: DC
  Administered 2015-09-26: 20 mg via ORAL
  Filled 2015-09-25: qty 1

## 2015-09-25 MED ORDER — GABAPENTIN 400 MG PO CAPS
400.0000 mg | ORAL_CAPSULE | Freq: Three times a day (TID) | ORAL | Status: DC
  Filled 2015-09-25: qty 1

## 2015-09-25 MED ORDER — PREGABALIN 75 MG PO CAPS
75.0000 mg | ORAL_CAPSULE | Freq: Two times a day (BID) | ORAL | Status: DC
  Administered 2015-09-25 – 2015-09-26 (×2): 75 mg via ORAL
  Filled 2015-09-25 (×2): qty 1

## 2015-09-25 NOTE — ED Notes (Signed)
Patient states at 2230 last night she had sudden onset numbness and tingling to left side, arm, leg, and face. Patient states the numbness has resolved, and most of the tingling except in her face. Patient has a hx of stroke in 06/2013. Patient denies pain. Patient grips strong and equal, gait steady, speech clear.

## 2015-09-25 NOTE — ED Notes (Signed)
Awake. Verbally responsive. A/O x4. Resp even and unlabored. No audible adventitious breath sounds noted. ABC's intact.  

## 2015-09-25 NOTE — ED Notes (Signed)
Awake. Verbally responsive. A/O x4. Resp even and unlabored. No audible adventitious breath sounds noted. ABC's intact. SR on monitor. IV saline lock patent and intact. 

## 2015-09-25 NOTE — Progress Notes (Signed)
NURSING PROGRESS NOTE  Alison Ward:[id number] Admission Data: 09/25/2015 8:44 PM Attending Provider: Theodis Blaze, MD [id number], Gabrielle Dare, MD Code Status: Full   Alison Ward is a 54 y.o. female patient admitted from ED:  -No acute distress noted.  -No complaints of shortness of breath.  -No complaints of chest pain.   Cardiac Monitoring: Box # 10 in place. Cardiac monitor yields:sinus bradycardia.  Blood pressure 119/102, pulse 68, temperature 97.3 F (36.3 C), temperature source Oral, resp. rate 18, height 5\' 8"  (1.727 m), weight 114.306 kg (252 lb), last menstrual period 11/24/2011, SpO2 97 %.   IV Fluids:  IV in place, occlusive dsg intact without redness, IV cath antecubital left, condition patent and no redness none.   Allergies:  Elavil  Past Medical History:   has a past medical history of Hypertension; Heart murmur; Stroke ([place]) (07/05/2013); and Chronic headaches.  Past Surgical History:   has past surgical history that includes Ventriculostomy (Left, 07/07/2013).  Social History:   reports that she has never smoked. She has never used smokeless tobacco. She reports that she does not drink alcohol or use illicit drugs.  Skin: Intact  Patient/Family orientated to room. Information packet given to patient/family. Admission inpatient armband information verified with patient/family to include name and date of birth and placed on patient arm. Side rails up x 2, fall assessment and education completed with patient/family. Patient/family able to verbalize understanding of risk associated with falls and verbalized understanding to call for assistance before getting out of bed. Call light within reach. Patient/family able to voice and demonstrate understanding of unit orientation instructions.

## 2015-09-25 NOTE — Progress Notes (Signed)
09/25/15 Report on patient from [place] at [facility] emergency room, Dx of Left Side Numbness,NPO till swallow screen is done,VTE Lovenox/ SCD's,VS every 2hours x 12 hours, Then every 4 hours, NIH scale every shift, IV site Left A/C no fluids at this time.

## 2015-09-25 NOTE — ED Notes (Signed)
Awake. Verbally responsive. A/O x4. Resp even and unlabored. No audible adventitious breath sounds noted. ABC's intact. Pt reported increase weakness and tingling to lt arm and unsteady gait. Speech clear. No facial drooping noted.

## 2015-09-25 NOTE — ED Notes (Signed)
Awake. Verbally responsive. A/O x4. Resp even and unlabored. No audible adventitious breath sounds noted. ABC's intact. Pt continues to report weakness and tingling to lt arm. Family at bedside.

## 2015-09-25 NOTE — Consult Note (Signed)
Neurology Consultation Reason for Consult: Left-sided paresthesia Referring Physician: Doyle Askew, I  CC: Left-sided paresthesia  History is obtained from: Patient  HPI: Alison Ward is a 54 y.o. female was in her normal; health today yesterday when she had tingling that started in her hand and then progressed upper arm towards her face and down into her leg. She states this started around 10 PM, and was persistent until she went to bed.  She states that she gets tingling in her left hand and sometimes spreads upper arm approximately once every other week. He has never been as bad as it was night, but has been similar to this in character in the past.  She has been having increased frequency of headaches which she describes as bitemporal and spreading down into her face. These are not associated with photophobia, but last for several hours. She does get some relief with gabapentin.  This morning, she noticed that she still had some left facial numbness and therefore sought care in the emergency department. She did have facial numbness following her stroke two years ago that had improved.  She did not notice any headache after the episode last night. She came into the emergency department today and had an MRI which was negative.  She denies staring spells, but her daughter has noted occasional confusional episodes in the past.   ROS: A 14 point ROS was performed and is negative except as noted in the HPI.   Past Medical History  Diagnosis Date  . Hypertension   . Heart murmur   . Stroke ([place]) 07/05/2013  . Chronic headaches     daily, bitemporal, associated with tingling in face     Family History  Problem Relation Age of Onset  . Hypertension Mother   . Hypertension Father   . Diabetes Daughter   . Colon cancer Neg Hx      Social History:  reports that she has never smoked. She has never used smokeless tobacco. She reports that she does not drink alcohol or use illicit  drugs.   Exam: Current vital signs: BP 119/102 mmHg  Pulse 68  Temp(Src) 97.3 F (36.3 C) (Oral)  Resp 18  Ht 5\' 8"  (1.727 m)  Wt 255 lb 11.7 oz (116 kg)  BMI 38.89 kg/m2  SpO2 97%  LMP 11/24/2011 Vital signs in last 24 hours: Temp:  [97.3 F (36.3 C)-98.3 F (36.8 C)] 97.3 F (36.3 C) (11/26 2018) Pulse Rate:  [55-68] 68 (11/26 2018) Resp:  [16-18] 18 (11/26 2018) BP: (110-154)/(70-102) 119/102 mmHg (11/26 2018) SpO2:  [97 %-100 %] 97 % (11/26 2018) Weight:  [252 lb (114.306 kg)-255 lb 11.7 oz (116 kg)] 255 lb 11.7 oz (116 kg) (11/26 2018)   Physical Exam  Constitutional: Appears well-developed and well-nourished.  Psych: Affect appropriate to situation Eyes: No scleral injection HENT: No OP obstrucion Head: Normocephalic.  Cardiovascular: Normal rate and regular rhythm.  Respiratory: Effort normal and breath sounds normal to anterior ascultation GI: Soft.  No distension. There is no tenderness.  Skin: WDI  Neuro: Mental Status: Patient is awake, alert, oriented to person, place, month, year, and situation. Patient is able to give a clear and coherent history. No signs of aphasia or neglect Cranial Nerves: II: Visual Fields are full. Pupils are equal, round, and reactive to light.   III,IV, VI: EOMI without ptosis or diploplia.  V: Facial sensation is decreased on the left to light touch VII: Facial movement is symmetric.  VIII: hearing is  intact to voice X: Uvula elevates symmetrically XI: Shoulder shrug is symmetric. XII: tongue is midline without atrophy or fasciculations.  Motor: Tone is normal. Bulk is normal. 5/5 strength was present in all four extremities but her fine coordination in her left side impaired Sensory: Sensation is symmetric to light touch and temperature in the arms. She has some decreased sensation in the left antero-lateral thigh  Cerebellar: FNF with mild incoordination in the left arm        I have reviewed labs in epic and  the results pertinent to this consultation are: BMP unremarkable.   I have reviewed the images obtained:MRI brain - negative.    Impression: 54 yo F with transient left sided numbness. Her description of tingling with spread up her arm and into her leg is more consistent with migraine or seizure than stroke. The fact that she has intermittent tingling in her left hand regularly that is similar in character, though less severe would also argue against a vascular cause of these symptoms.   With her increasing headaches, I think that migraine aura could be a likely culprit, though the symptoms were slightly long for migraine aura. Simple partial seizure would be another possible etiology but the duration is also long for this.   I suspect that she has had some facial numbness since the previous stroke but is more aware of it now since her symptoms. There is no sign of stroke on MRI.   She was supposed to be changed to lyrica per her neurologist(Dr. Leonie Man) and I think this would be a reasonable choice as it could help with her meralgia paresthetica, migraines and whether this represents migraine aura or seizure could help with it as well.   Recommendations: 1) Lyrica 75mg  BID for one week, then 150mg  BID.  2) If patient is here until Monday, then would order EEG, otherwise would pursue this as an outpatient.    Roland Rack, MD [facility] [phone]  If 7pm- 7am, please page neurology on call as listed in [place].

## 2015-09-25 NOTE — ED Notes (Signed)
Awake. Verbally responsive. A/O x4. Resp even and unlabored. No audible adventitious breath sounds noted. ABC's intact. Family at bedside. 

## 2015-09-25 NOTE — ED Notes (Signed)
Awake. Verbally responsive. A/O x4. Resp even and unlabored. No audible adventitious breath sounds noted. ABC's intact. IV saline lock patent and intact. 

## 2015-09-25 NOTE — H&P (Signed)
[facility] History and Physical  Alison Ward [id number] DOB: 07/01/1961 DOA: 09/25/2015  Referring physician: ED physician, Dr. Rex Kras  PCP: Angelica Chessman, MD   Chief Complaint: left arm numbness and tingling   HPI:  54 year old female with hypertension, hemorrhagic stroke with chronic headaches who presented with sudden onset of left side face and body numbness and tingling that started around 22:30 last night and when she woke up, she noted some improvement but facial numbness persistent and now involves the right side as well. Pt denies slurred speech, no difficulty with swallowing, no lower extremity weakness or numbness at this time. Pt also denies fevers, chills, abd or urinary concerns. Pt denies similar events in the past. She sees Dr. Leonie Man in an outpatient setting for headaches.   In ED, pt noted to be hemodynamically stable, VSS, blood work notable for WBC 3.7, Cr 1.02. MRI with no acute stroke. ED doctor called neurology and Dr. Nicole Kindred recommended admission to [facility] for TIA work.  Assessment and Plan: Principal Problem:   Left sided numbness - Admit to telemetry bed at [facility] for TIA workup - Neurology team notified and is expecting patient at [facility] - TIA order set in place - vascular carotid doppler requested, 2 D ECHO - PT/OT/SLP - check lipid panel and A1C - advance diet when pt passes swallow test, regular diet is OK  - CXR per protocol  - neuro checks per floor protocol   Active Problems:   Obesity, unspecified - pt meets criteria with BMI > 35, and underlying risk factor of HTN, pre diabetes  - Body mass index is 38.33 kg/(m^2).    Prediabetes - A1C requested     Meralgia paresthetica of left side - typically in lower extremities - follow with Dr. Leonie Man    Acute kidney injury [facility]) - mild, encouraged PO intake when passes swallow test - repeat BMP in AM    Leukopenia - unclear etiology, CBC In AM    Essential hypertension,  benign - continue Lisinopril - HCTZ per home medical regimen  - may need to be held if cr trending up in next 24 hours   Lovenox for DVT prophylaxis   Radiological Exams on Admission: Mr Brain Wo Contrast (neuro Protocol) 09/25/2015 1. No acute intracranial abnormality. 2. Chronic posterior right MCA infarct.   Code Status: Full Family Communication: Pt at bedside Disposition Plan: Admit for further evaluation, [facility] telemetry bed   [facility] [id number]  Review of Systems:  Constitutional: Negative for fever, chills and malaise/fatigue. Negative for diaphoresis.  HENT: Negative for hearing loss, ear pain, nosebleeds, congestion, sore throat, neck pain, tinnitus and ear discharge.   Eyes: Negative for blurred vision, double vision, photophobia, pain, discharge and redness.  Respiratory: Negative for cough, hemoptysis, sputum production, shortness of breath, wheezing and stridor.   Cardiovascular: Negative for chest pain, palpitations, orthopnea, claudication and leg swelling.  Gastrointestinal: Negative for nausea, vomiting and abdominal pain. Negative for heartburn, constipation, blood in stool and melena.  Genitourinary: Negative for dysuria, urgency, frequency, hematuria and flank pain.  Musculoskeletal: Negative for myalgias, back pain, joint pain and falls.  Skin: Negative for itching and rash.  Neurological: per HPI  Endo/Heme/Allergies: Negative for environmental allergies and polydipsia.  Psychiatric/Behavioral: Negative for suicidal ideas. The patient is not nervous/anxious.      Past Medical History  Diagnosis Date  . Hypertension   . Heart murmur   . Stroke ([place]) 07/05/2013  . Chronic headaches  daily, bitemporal, associated with tingling in face    Past Surgical History  Procedure Laterality Date  . Ventriculostomy Left 07/07/2013    Procedure: VENTRICULOSTOMY;  Surgeon: Ophelia Charter, MD;  Location: [place] [facility]UR[facility];  Service: Neurosurgery;  Laterality:  Left;  Left Ventriculostomy and Removal of Right Vetriculostomy    Social History:  reports that she has never smoked. She has never used smokeless tobacco. She reports that she does not drink alcohol or use illicit drugs.  Allergies  Allergen Reactions  . Elavil [Amitriptyline] Other (See Comments)    DIZZINESS    Family History  Problem Relation Age of Onset  . Hypertension Mother   . Hypertension Father   . Diabetes Daughter   . Colon cancer Neg Hx     Prior to Admission medications   Medication Sig Start Date End Date Taking? Authorizing Provider  gabapentin (NEURONTIN) 300 MG capsule Take 1 capsule (300 mg total) by mouth 2 (two) times daily. Patient taking differently: Take 300 mg by mouth at bedtime.  06/16/15  Yes Arnoldo Morale, MD  lisinopril-hydrochlorothiazide (PRINZIDE,ZESTORETIC) 20-12.5 MG tablet Take 1 tablet by mouth daily.   Yes Historical Provider, MD  acetaminophen-codeine (TYLENOL #3) 300-30 MG tablet Take 1 tablet by mouth every 4 (four) hours as needed. Patient not taking: Reported on 09/25/2015 09/02/15   Tresa Garter, MD  butalbital-acetaminophen-caffeine (FIORICET/CODEINE) 50-325-40-30 MG per capsule Take 1 capsule by mouth every 6 (six) hours as needed for headache. Patient not taking: Reported on 09/25/2015 06/16/15   Arnoldo Morale, MD  ibuprofen (ADVIL,MOTRIN) 800 MG tablet Take 1 tablet (800 mg total) by mouth every 8 (eight) hours as needed for headache or mild pain. Patient not taking: Reported on 09/25/2015 07/16/14   Tresa Garter, MD  lisinopril-hydrochlorothiazide (PRINZIDE,ZESTORETIC) 20-25 MG per tablet Take 1 tablet by mouth daily. 06/16/15   Arnoldo Morale, MD  pregabalin (LYRICA) 50 MG capsule Take 1 capsule (50 mg total) by mouth 2 (two) times daily. Patient not taking: Reported on 09/25/2015 09/07/15   Garvin Fila, MD  tizanidine (ZANAFLEX) 2 MG capsule Take 1 capsule (2 mg total) by mouth 2 (two) times daily. Start as 1 capsule at  night x 2 weeks then increase as tolerated Patient not taking: Reported on 09/25/2015 09/07/15   Garvin Fila, MD    Physical Exam: Filed Vitals:   09/25/15 1300 09/25/15 1400 09/25/15 1444 09/25/15 1613  BP: 131/73 122/87 120/77 141/92  Pulse: 58 58 66 60  Temp:   98.3 F (36.8 C)   TempSrc:   Oral   Resp: 18 18 18 18   Height:      Weight:      SpO2: 98% 97% 99% 100%    Physical Exam  Constitutional: Appears well-developed and well-nourished. No distress.  HENT: Normocephalic. External right and left ear normal. Oropharynx is clear and moist.  Eyes: Conjunctivae and EOM are normal. PERRLA, no scleral icterus.  Neck: Normal ROM. Neck supple. No JVD. No tracheal deviation. No thyromegaly.  CVS: RRR, S1/S2 +, SEM 3/6, no gallops, no carotid bruit.  Pulmonary: Effort and breath sounds normal, no stridor, rhonchi, wheezes, rales.  Abdominal: Soft. BS +,  no distension, tenderness, rebound or guarding.  Musculoskeletal: Normal range of motion. No edema and no tenderness.  Lymphadenopathy: No lymphadenopathy noted, cervical, inguinal. Neuro: Alert. Normal reflexes, muscle tone coordination. No cranial nerve deficit. Skin: Skin is warm and dry. No rash noted. Not diaphoretic. No erythema. No pallor.  Psychiatric:  Normal mood and affect. Behavior, judgment, thought content normal.   Labs on Admission:  Basic Metabolic Panel:  Recent Labs Lab 09/25/15 1209  NA 141  K 3.5  CL 102  CO2 33*  GLUCOSE 94  BUN 15  CREATININE 1.03*  CALCIUM 9.8   CBC:  Recent Labs Lab 09/25/15 1209  WBC 3.7*  NEUTROABS 1.4*  HGB 12.3  HCT 38.6  MCV 88.9  PLT 240    EKG: pending   If 7PM-7AM, please contact night-coverage [email or web address] Password [email or web address] 09/25/2015, 4:33 PM

## 2015-09-25 NOTE — ED Notes (Signed)
Pt taken to MRI and returned without distress noted.

## 2015-09-25 NOTE — ED Notes (Signed)
Patient returned from X-ray 

## 2015-09-25 NOTE — ED Provider Notes (Signed)
CSN: [id number]     Arrival date & time 09/25/15  [id number] History   First MD Initiated Contact with Patient 09/25/15 1040     Chief Complaint  Patient presents with  . Tingling    left side, arm, leg, face     (Consider location/radiation/quality/duration/timing/severity/associated sxs/prior Treatment) HPI Comments: 54 year old female with past medical history including hypertension, hemorrhagic stroke with chronic headaches who presents with numbness and tingling. The patient reports that at 2230 last night, she had a sudden onset of numbness and tingling to the left side of her face and body. She went to bed with these symptoms and woke up with left hand weakness and persistent tingling on her left side. She reports that the weakness has resolved and all of her tingling has improved except for her face and she now feels tingling bilaterally on her face. She denies any headache, visual changes, problems walking, fevers, vomiting, or recent illness.  The history is provided by the patient.    Past Medical History  Diagnosis Date  . Hypertension   . Heart murmur   . Stroke ([place]) 07/05/2013  . Chronic headaches     daily, bitemporal, associated with tingling in face   Past Surgical History  Procedure Laterality Date  . Ventriculostomy Left 07/07/2013    Procedure: VENTRICULOSTOMY;  Surgeon: Ophelia Charter, MD;  Location: [place] [facility];  Service: Neurosurgery;  Laterality: Left;  Left Ventriculostomy and Removal of Right Vetriculostomy   Family History  Problem Relation Age of Onset  . Hypertension Mother   . Hypertension Father   . Diabetes Daughter   . Colon cancer Neg Hx    Social History  Substance Use Topics  . Smoking status: Never Smoker   . Smokeless tobacco: Never Used  . Alcohol Use: No   OB History    No data available     Review of Systems 10 Systems reviewed and are negative for acute change except as noted in the HPI.    Allergies  Elavil  Home  Medications   Prior to Admission medications   Medication Sig Start Date End Date Taking? Authorizing Provider  gabapentin (NEURONTIN) 300 MG capsule Take 1 capsule (300 mg total) by mouth 2 (two) times daily. Patient taking differently: Take 300 mg by mouth at bedtime.  06/16/15  Yes Arnoldo Morale, MD  lisinopril-hydrochlorothiazide (PRINZIDE,ZESTORETIC) 20-12.5 MG tablet Take 1 tablet by mouth daily.   Yes Historical Provider, MD  acetaminophen-codeine (TYLENOL #3) 300-30 MG tablet Take 1 tablet by mouth every 4 (four) hours as needed. Patient not taking: Reported on 09/25/2015 09/02/15   Tresa Garter, MD  butalbital-acetaminophen-caffeine (FIORICET/CODEINE) 50-325-40-30 MG per capsule Take 1 capsule by mouth every 6 (six) hours as needed for headache. Patient not taking: Reported on 09/25/2015 06/16/15   Arnoldo Morale, MD  ibuprofen (ADVIL,MOTRIN) 800 MG tablet Take 1 tablet (800 mg total) by mouth every 8 (eight) hours as needed for headache or mild pain. Patient not taking: Reported on 09/25/2015 07/16/14   Tresa Garter, MD  lisinopril-hydrochlorothiazide (PRINZIDE,ZESTORETIC) 20-25 MG per tablet Take 1 tablet by mouth daily. 06/16/15   Arnoldo Morale, MD  pregabalin (LYRICA) 50 MG capsule Take 1 capsule (50 mg total) by mouth 2 (two) times daily. Patient not taking: Reported on 09/25/2015 09/07/15   Garvin Fila, MD  tizanidine (ZANAFLEX) 2 MG capsule Take 1 capsule (2 mg total) by mouth 2 (two) times daily. Start as 1 capsule at night x 2 weeks then  increase as tolerated Patient not taking: Reported on 09/25/2015 09/07/15   Garvin Fila, MD   BP 141/92 mmHg  Pulse 60  Temp(Src) 98.3 F (36.8 C) (Oral)  Resp 18  Ht 5\' 8"  (1.727 m)  Wt 252 lb (114.306 kg)  BMI 38.33 kg/m2  SpO2 100%  LMP 11/24/2011 Physical Exam  Constitutional: She is oriented to person, place, and time. She appears well-developed and well-nourished. No distress.  Awake, alert  HENT:  Head: Normocephalic  and atraumatic.  Eyes: Conjunctivae and EOM are normal. Pupils are equal, round, and reactive to light.  Neck: Neck supple.  Cardiovascular: Normal rate, regular rhythm and normal heart sounds.   No murmur heard. Pulmonary/Chest: Effort normal and breath sounds normal. No respiratory distress.  Abdominal: Soft. Bowel sounds are normal. She exhibits no distension.  Musculoskeletal: She exhibits no edema.  Neurological: She is alert and oriented to person, place, and time. She has normal reflexes. No cranial nerve deficit. She exhibits normal muscle tone.  Fluent speech, normal finger-to-nose testing, negative pronator drift, 5/5 strength and normal sensation x all 4 ext  Skin: Skin is warm and dry.  Psychiatric: She has a normal mood and affect. Judgment and thought content normal.  Nursing note and vitals reviewed.   ED Course  Procedures (including critical care time) Labs Review Labs Reviewed  BASIC METABOLIC PANEL - Abnormal; Notable for the following:    CO2 33 (*)    Creatinine, Ser 1.03 (*)    GFR calc non Af Amer 60 (*)    All other components within normal limits  CBC WITH DIFFERENTIAL/PLATELET - Abnormal; Notable for the following:    WBC 3.7 (*)    RDW 15.6 (*)    Neutro Abs 1.4 (*)    All other components within normal limits  URINALYSIS, ROUTINE W REFLEX MICROSCOPIC (NOT AT [facility])    Imaging Review Mr Brain Wo Contrast (neuro Protocol)  09/25/2015  CLINICAL DATA:  Sudden onset of left arm, leg, and face numbness last night, largely resolved. Left hand weakness. EXAM: MRI HEAD WITHOUT CONTRAST TECHNIQUE: Multiplanar, multiecho pulse sequences of the brain and surrounding structures were obtained without intravenous contrast. COMPARISON:  05/27/2014 head CT FINDINGS: A moderately enlarged empty sella is again seen. There is no evidence of acute infarct, mass, midline shift, or extra-axial fluid collection. Small foci of T2 hyperintensity in the subcortical and deep  cerebral white matter bilaterally are nonspecific but compatible with mild chronic small vessel ischemic disease. There is mild global cerebral atrophy. Encephalomalacia and chronic blood products are noted in the posterior right MCA territory consistent with chronic hemorrhagic infarct involving the temporoparietal region. There is associated ex vacuo dilatation of the adjacent right lateral ventricle. Focal encephalomalacia and a small amount of chronic blood products are noted in the right frontal lobe consistent with prior ventriculostomy catheter placement. Orbits are unremarkable. Paranasal sinuses and mastoid air cells are clear. Major intracranial vascular flow voids are preserved. IMPRESSION: 1. No acute intracranial abnormality. 2. Chronic posterior right MCA infarct. Electronically Signed   By: Logan Bores M.D.   On: 09/25/2015 13:04   I have personally reviewed and evaluated these lab results as part of my medical decision-making.   EKG Interpretation None      MDM   Final diagnoses:  Left sided numbness  Left hand weakness    Pt w/ h/o hemorrhagic stroke p/w L-sided numbness and tingling that began last night and transient L hand weakness this morning that  has since resolved. At presentation, the patient was well-appearing with reassuring vital signs. No obvious neurologic deficits on exam. She did endorse persistent tingling of her face which was bilateral. Obtained above labs which were unremarkable. Because of the patient's history, obtained an MRI of the brain which showed no acute process. I discussed with neurology, Dr. Nicole Kindred, who recommended admission for TIA workup. He requested transfer to [facility] for evaluation. I discussed with [facility], who will admit patient and we will transfer for further care. Patient in agreement with plan.  Sharlett Iles, MD 09/25/15 [phone]

## 2015-09-25 NOTE — ED Notes (Signed)
Patient transported to X-ray 

## 2015-09-26 ENCOUNTER — Observation Stay (HOSPITAL_BASED_OUTPATIENT_CLINIC_OR_DEPARTMENT_OTHER): Payer: 59

## 2015-09-26 DIAGNOSIS — R200 Anesthesia of skin: Secondary | ICD-10-CM | POA: Diagnosis not present

## 2015-09-26 DIAGNOSIS — G459 Transient cerebral ischemic attack, unspecified: Secondary | ICD-10-CM | POA: Diagnosis not present

## 2015-09-26 DIAGNOSIS — R7303 Prediabetes: Secondary | ICD-10-CM | POA: Diagnosis not present

## 2015-09-26 DIAGNOSIS — I10 Essential (primary) hypertension: Secondary | ICD-10-CM | POA: Diagnosis not present

## 2015-09-26 MED ORDER — PREGABALIN 150 MG PO CAPS: 150.0000 mg | ORAL_CAPSULE | Freq: Two times a day (BID) | ORAL | Status: DC

## 2015-09-26 MED ORDER — PREGABALIN 75 MG PO CAPS: 75.0000 mg | ORAL_CAPSULE | Freq: Two times a day (BID) | ORAL | Status: DC

## 2015-09-26 NOTE — Progress Notes (Signed)
09/26/15 Patient going today, IV site removed, and discharge instructions reviewed with patient. Scripts given to patient for Lyrica  75mg   and 150 mg.

## 2015-09-26 NOTE — Evaluation (Signed)
Occupational Therapy Evaluation Patient Details Name: Alison Ward MRN: [id number] DOB: Sep 18, 1961 Today's Date: 09/26/2015    History of Present Illness Alison Ward is a 54 y.o. female was in her normal; health today yesterday when she had tingling that started in her hand and then progressed upper arm towards her face and down into her leg. She states this started around 10 PM, and was persistent until she went to bed.   Clinical Impression   Patient admitted with above. Patient independent PTA. Patient currently functioning at an overall mod I level for increased time.  No additional OT needs identified, D/C from acute OT services and no additional follow-up OT needs at this time. All appropriate education provided to patient. Please re-order OT if needed.      Follow Up Recommendations  No OT follow up;Supervision - Intermittent    Equipment Recommendations  3 in 1 bedside comode    Recommendations for Other Services  None at this time   Precautions / Restrictions Precautions Precautions: Other (comment) Precaution Comments: random syncope episodes Restrictions Weight Bearing Restrictions: No    Mobility Bed Mobility Overal bed mobility: Modified Independent General bed mobility comments: used bed rail  Transfers Overall transfer level: Needs assistance Equipment used: None Transfers: Sit to/from Stand Sit to Stand: Supervision General transfer comment: sueprvision due to recent history of syncope, however pt demo'd safe transfer/good technique/no intabiltiy    Balance Overall balance assessment: No apparent balance deficits (not formally assessed)    ADL Overall ADL's : Modified independent;At baseline  General ADL Comments: increased time    Vision Vision Assessment?: No apparent visual deficits          Pertinent Vitals/Pain Pain Assessment: No/denies pain     Hand Dominance Right   Extremity/Trunk Assessment Upper Extremity Assessment Upper  Extremity Assessment: Overall WFL for tasks assessed   Lower Extremity Assessment Lower Extremity Assessment: Overall WFL for tasks assessed   Cervical / Trunk Assessment Cervical / Trunk Assessment: Normal   Communication Communication Communication: No difficulties   Cognition Arousal/Alertness: Awake/alert Behavior During Therapy: WFL for tasks assessed/performed Overall Cognitive Status: Within Functional Limits for tasks assessed              Home Living Family/patient expects to be discharged to:: Private residence Living Arrangements: Children Available Help at Discharge: Family;Available 24 hours/day Type of Home: Apartment Home Access: Stairs to enter Entrance Stairs-Number of Steps: 3 Entrance Stairs-Rails: Right Home Layout: One level     Bathroom Shower/Tub: Corporate investment banker: Standard     Home Equipment: None   Additional Comments: pt lives with daughter and 50 yo granddaughter.  Pt reports other daughter who "works from home" comes during the day to work at her apartment.       Prior Functioning/Environment Level of Independence: Independent  Comments: was driving but only can drive [id number] min, reports longer distances it's hard for her to focus/ fatigues to quicly. can't multi-task. Pt has had 3 jobs since september due to inability to multitask combined with 25 min commute.    OT Diagnosis: Generalized weakness   OT Problem List:   n/a, no acute OT needs identified    OT Treatment/Interventions:   n/a, no acute OT needs identified    OT Goals(Current goals can be found in the care plan section) Acute Rehab OT Goals Patient Stated Goal: ready to go home OT Goal Formulation: All assessment and education complete, DC therapy  OT Frequency:  n/a, no acute OT needs identified    Barriers to D/C:  none known at this time    End of Session Activity Tolerance: Patient tolerated treatment well Patient left: in chair;with call  bell/phone within reach;with family/visitor present   Time: 1355-1411 OT Time Calculation (min): 16 min Charges:  OT General Charges $OT Visit: 1 Procedure OT Evaluation $Initial OT Evaluation Tier I: 1 Procedure G-Codes: OT G-codes **NOT FOR INPATIENT CLASS** Functional Limitation: Self care Self Care Current Status [id number]): At least 1 percent but less than 20 percent impaired, limited or restricted (mod I) Self Care Goal Status [id number]): At least 1 percent but less than 20 percent impaired, limited or restricted (mod I) Self Care Discharge Status [phone]): At least 1 percent but less than 20 percent impaired, limited or restricted (mod I)  Nithin Demeo , MS, OTR/L, CLT Pager: [phone]  09/26/2015, 2:16 PM

## 2015-09-26 NOTE — Discharge Instructions (Signed)
Follow with Primary MD Angelica Chessman, MD in 7 days   Get CBC, CMP, 2 view Chest X ray checked  by Primary MD next visit.    Activity: As tolerated with Full fall precautions use walker/cane & assistance as needed   Disposition Home    Diet: Heart Healthy  , with feeding assistance and aspiration precautions.  For Heart failure patients - Check your Weight same time everyday, if you gain over 2 pounds, or you develop in leg swelling, experience more shortness of breath or chest pain, call your Primary MD immediately. Follow Cardiac Low Salt Diet and 1.5 lit/day fluid restriction.   On your next visit with your primary care physician please Get Medicines reviewed and adjusted.   Please request your Prim.MD to go over all Hospital Tests and Procedure/Radiological results at the follow up, please get all Hospital records sent to your Prim MD by signing hospital release before you go home.   If you experience worsening of your admission symptoms, develop shortness of breath, life threatening emergency, suicidal or homicidal thoughts you must seek medical attention immediately by calling 911 or calling your MD immediately  if symptoms less severe.  You Must read complete instructions/literature along with all the possible adverse reactions/side effects for all the Medicines you take and that have been prescribed to you. Take any new Medicines after you have completely understood and accpet all the possible adverse reactions/side effects.   Do not drive, operating heavy machinery, perform activities at heights, swimming or participation in water activities or provide baby sitting services if your were admitted for syncope or siezures until you have seen by Primary MD or a Neurologist and advised to do so again.  Do not drive when taking Pain medications.    Do not take more than prescribed Pain, Sleep and Anxiety Medications  Special Instructions: If you have smoked or chewed Tobacco   in the last 2 yrs please stop smoking, stop any regular Alcohol  and or any Recreational drug use.  Wear Seat belts while driving.   Please note  You were cared for by a hospitalist during your hospital stay. If you have any questions about your discharge medications or the care you received while you were in the hospital after you are discharged, you can call the unit and asked to speak with the hospitalist on call if the hospitalist that took care of you is not available. Once you are discharged, your primary care physician will handle any further medical issues. Please note that NO REFILLS for any discharge medications will be authorized once you are discharged, as it is imperative that you return to your primary care physician (or establish a relationship with a primary care physician if you do not have one) for your aftercare needs so that they can reassess your need for medications and monitor your lab values.

## 2015-09-26 NOTE — Evaluation (Signed)
Physical Therapy Evaluation Patient Details Name: Alison Ward MRN: [id number] DOB: 1961/10/19 Today's Date: 09/26/2015   History of Present Illness  Alison Ward is a 54 y.o. female was in her normal; health today yesterday when she had tingling that started in her hand and then progressed upper arm towards her face and down into her leg. She states this started around 10 PM, and was persistent until she went to bed.  Clinical Impression  Pt admitted with above. Pt presenting with mild L UE and face altered sensation. Pt functioning near baseline however con't to have recurrent random episodes of syncope. Recommend 24/7 supervision due to these episodes. Pt c/o is that she can't work due to inability to [employer] and focus. For these things cause her to have headaches and these episodes to come one. For example she reports, she tried to run 3 local errands and had to come home and take medicine and lay down for 2 hours. 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 Pt not currently safe to care for self and to require 24/7 supervision.    Follow Up Recommendations No PT follow up;Supervision/Assistance - 24 hour    Equipment Recommendations  None recommended by PT    Recommendations for Other Services       Precautions / Restrictions Precautions Precautions: Other (comment) Precaution Comments: random syncope episodes Restrictions Weight Bearing Restrictions: No      Mobility  Bed Mobility Overal bed mobility: Modified Independent             General bed mobility comments: used bed rail  Transfers Overall transfer level: Needs assistance Equipment used: None Transfers: Sit to/from Stand Sit to Stand: Supervision         General transfer comment: sueprvision due to recent history of syncope, however pt demo'd safe transfer/good technique/no  intabiltiy  Ambulation/Gait Ambulation/Gait assistance: Min guard Ambulation Distance (Feet): 100 Feet (x2) Assistive device: None Gait Pattern/deviations: Step-through pattern;WFL(Within Functional Limits) Gait velocity: wfl   General Gait Details: pt amb to stairs and completed a flight of stairs and then requested "i need to sit down for 2 minutes." otherwise no episodes of LOB. Pt reports "see when I do too much, I start to get a little headache and faint feeling"  Stairs Stairs: Yes Stairs assistance: Min guard Stair Management: One rail Right Number of Stairs: 12 General stair comments: pt with report of L LE discomfort, educated on going up with strong (R) LE and down to the L (LE).   Wheelchair Mobility    Modified Rankin (Stroke Patients Only)       Balance Overall balance assessment: Modified Independent (reports "I know when my episodes are comming and can catch i)                                           Pertinent Vitals/Pain Pain Assessment: No/denies pain    Home Living Family/patient expects to be discharged to:: Private residence Living Arrangements: Children Available Help at Discharge: Family;Available 24 hours/day Type of Home: Apartment Home Access: Stairs to enter Entrance Stairs-Rails: Right Entrance Stairs-Number of Steps: 3 Home Layout: One level Home Equipment: None Additional Comments: pt lives with daughter and 32 yo granddaughter.  Pt reports other daughter who "works from home"  comes during the day to work at her apartment.     Prior Function Level of Independence: Independent         Comments: was driving but only can drive [id number] min, reports longer distances it's hard for her to focus/ fatigues to quicly. can't multi-task. Pt has had 3 jobs since september due to inability to multitask combined with 25 min commute.     Hand Dominance   Dominant Hand: Right    Extremity/Trunk Assessment   Upper Extremity  Assessment: Overall WFL for tasks assessed           Lower Extremity Assessment: Overall WFL for tasks assessed      Cervical / Trunk Assessment: Normal (reports mild numbness in L side of face)  Communication   Communication: No difficulties  Cognition Arousal/Alertness: Awake/alert Behavior During Therapy: WFL for tasks assessed/performed Overall Cognitive Status: Within Functional Limits for tasks assessed                      General Comments General comments (skin integrity, edema, etc.): pt very talkative and expressed how "stressed" she is and how her son in the [place] wants her to move back with him. Pt reports "it is more relaxing there, and [place] so i can just go there and listen to the water."    Exercises        Assessment/Plan    PT Assessment Patient needs continued PT services  PT Diagnosis Generalized weakness   PT Problem List Decreased activity tolerance;Decreased balance;Decreased mobility  PT Treatment Interventions Gait training;Stair training;Functional mobility training;Therapeutic activities;Therapeutic exercise;Balance training   PT Goals (Current goals can be found in the Care Plan section) Acute Rehab PT Goals Patient Stated Goal: home PT Goal Formulation: With patient Time For Goal Achievement: 10/03/15 Potential to Achieve Goals: Good Additional Goals Additional Goal #1: Pt to score > 19 on DGI.    Frequency Min 2X/week   Barriers to discharge        Co-evaluation               End of Session Equipment Utilized During Treatment: Gait belt Activity Tolerance: Patient tolerated treatment well Patient left: in chair;with call bell/phone within reach Nurse Communication: Mobility status    Functional Assessment Tool Used: clinical judgement Functional Limitation: Mobility: Walking and moving around Mobility: Walking and Moving Around Current Status [id number]): At least 1 percent but less than 20  percent impaired, limited or restricted Mobility: Walking and Moving Around Goal Status [phone]): At least 1 percent but less than 20 percent impaired, limited or restricted    Time: 0759-0828 PT Time Calculation (min) (ACUTE ONLY): 29 min   Charges:   PT Evaluation $Initial PT Evaluation Tier I: 1 Procedure PT Treatments $Gait Training: 8-22 mins   PT G Codes:   PT G-Codes **NOT FOR INPATIENT CLASS** Functional Assessment Tool Used: clinical judgement Functional Limitation: Mobility: Walking and moving around Mobility: Walking and Moving Around Current Status [id number]): At least 1 percent but less than 20 percent impaired, limited or restricted Mobility: Walking and Moving Around Goal Status [phone]): At least 1 percent but less than 20 percent impaired, limited or restricted    Kingsley Callander 09/26/2015, 9:44 AM   Kittie Plater, PT, DPT Pager #: [phone] Office #: [phone]

## 2015-09-26 NOTE — Progress Notes (Signed)
VASCULAR LAB PRELIMINARY  PRELIMINARY  PRELIMINARY  PRELIMINARY  Carotid duplex completed.    Preliminary report:  Bilateral:  1-39% ICA stenosis.  Lowest end of scale.  Vertebral artery flow is antegrade.     Shailah Gibbins, RVS 09/26/2015, 1:11 PM

## 2015-09-26 NOTE — Progress Notes (Signed)
SLP Cancellation Note  Patient Details Name: Alison Ward MRN: [id number] DOB: Nov 09, 1960   Cancelled treatment:       Reason Eval/Treat Not Completed: Patient at procedure or test/unavailable   ADAMS,PAT, M.S., CCC-SLP 09/26/2015, 1:18 PM

## 2015-09-26 NOTE — Discharge Summary (Signed)
Alison Ward, is a 54 y.o. female  DOB 05-Mar-1961  MRN [id number].  Admission date:  09/25/2015  Admitting Physician  Theodis Blaze, MD  Discharge Date:  09/26/2015   Primary MD  Angelica Chessman, MD  Recommendations for primary care physician for things to follow:  -  follow with neurology next week - Will need EEG as an outpatient   Admission Diagnosis  TIA (transient ischemic attack) [G45.9] Left hand weakness [M62.89] Left sided numbness [R20.0]   Discharge Diagnosis  TIA (transient ischemic attack) [G45.9] Left hand weakness [M62.89] Left sided numbness [R20.0]    Principal Problem:   Left sided numbness Active Problems:   Obesity, unspecified   Essential hypertension, benign   Prediabetes   Meralgia paresthetica of left side   Acute kidney injury ([employer])   Leukopenia      Past Medical History  Diagnosis Date  . Hypertension   . Heart murmur   . Stroke ([place]) 07/05/2013  . Chronic headaches     daily, bitemporal, associated with tingling in face    Past Surgical History  Procedure Laterality Date  . Ventriculostomy Left 07/07/2013    Procedure: VENTRICULOSTOMY;  Surgeon: Ophelia Charter, MD;  Location: [facility];  Service: Neurosurgery;  Laterality: Left;  Left Ventriculostomy and Removal of Right Vetriculostomy       History of present illness and  Hospital Course:     Kindly see H&P for history of present illness and admission details, please review complete Labs, Consult reports and Test reports for all details in brief  HPI  from the history and physical done on the day of admission 11/26  54 year old female with hypertension, hemorrhagic stroke with chronic headaches who presented with sudden onset of left side face and body numbness and tingling that started around 22:30 last night and when she woke up, she noted some improvement but facial numbness persistent  and now involves the right side as well. Pt denies slurred speech, no difficulty with swallowing, no lower extremity weakness or numbness at this time. Pt also denies fevers, chills, abd or urinary concerns. Pt denies similar events in the past. She sees Dr. Leonie Man in an outpatient setting for headaches.   In ED, pt noted to be hemodynamically stable, VSS, blood work notable for WBC 3.7, Cr 1.02. MRI with no acute stroke. ED doctor called neurology and Dr. Nicole Kindred recommended admission to [facility] for TIA work.  Hospital Course   Transient left-sided numbness - Neurology consult greatly appreciated, this is more consistent with migraine or seizures or stroke, she had intermittent tingling in her left hand regularly, patient was supposed to be changed to Lyrica per her neurologist Dr. Leonie Man, but she did not start Lyrica given she did not pick it up from pharmacy yet. - Will need EEG as an outpatient -MRI of the brain with no evidence of acute stroke,  Prediabetes - Glycohemoglobin A1c is 5.8  Hypertension - Continue with home medication  Discharge Condition:  stable   Follow UP  Follow-up Information    Follow up with Angelica Chessman, MD. Schedule an appointment as soon as possible for a visit in 1 week.   Specialty:  Internal Medicine   Why:  Posthospitalization follow-up   Contact information:   [place] [place] [address] [phone]       Follow up with SETHI,PRAMOD, MD. Schedule an appointment as soon as possible for a visit in 1 week.   Specialties:  Neurology, Radiology   Why:  Posthospitalization follow-up   Contact information:   [address] [place] [address] [phone]         Discharge Instructions  and  Discharge Medications     Discharge Instructions    Diet - low sodium heart healthy    Complete by:  As directed      Discharge instructions    Complete by:  As directed   Follow with Primary MD Angelica Chessman, MD in 7  days   Get CBC, CMP, 2 view Chest X ray checked  by Primary MD next visit.    Activity: As tolerated with Full fall precautions use walker/cane & assistance as needed   Disposition Home    Diet: Heart Healthy  , with feeding assistance and aspiration precautions.  For Heart failure patients - Check your Weight same time everyday, if you gain over 2 pounds, or you develop in leg swelling, experience more shortness of breath or chest pain, call your Primary MD immediately. Follow Cardiac Low Salt Diet and 1.5 lit/day fluid restriction.   On your next visit with your primary care physician please Get Medicines reviewed and adjusted.   Please request your Prim.MD to go over all Hospital Tests and Procedure/Radiological results at the follow up, please get all Hospital records sent to your Prim MD by signing hospital release before you go home.   If you experience worsening of your admission symptoms, develop shortness of breath, life threatening emergency, suicidal or homicidal thoughts you must seek medical attention immediately by calling 911 or calling your MD immediately  if symptoms less severe.  You Must read complete instructions/literature along with all the possible adverse reactions/side effects for all the Medicines you take and that have been prescribed to you. Take any new Medicines after you have completely understood and accpet all the possible adverse reactions/side effects.   Do not drive, operating heavy machinery, perform activities at heights, swimming or participation in water activities or provide baby sitting services if your were admitted for syncope or siezures until you have seen by Primary MD or a Neurologist and advised to do so again.  Do not drive when taking Pain medications.    Do not take more than prescribed Pain, Sleep and Anxiety Medications  Special Instructions: If you have smoked or chewed Tobacco  in the last 2 yrs please stop smoking, stop any  regular Alcohol  and or any Recreational drug use.  Wear Seat belts while driving.   Please note  You were cared for by a hospitalist during your hospital stay. If you have any questions about your discharge medications or the care you received while you were in the hospital after you are discharged, you can call the unit and asked to speak with the hospitalist on call if the hospitalist that took care of you is not available. Once you are discharged, your primary care physician will handle any further medical issues. Please note that NO REFILLS for any discharge medications will be authorized  once you are discharged, as it is imperative that you return to your primary care physician (or establish a relationship with a primary care physician if you do not have one) for your aftercare needs so that they can reassess your need for medications and monitor your lab values.     Increase activity slowly    Complete by:  As directed             Medication List    STOP taking these medications        gabapentin 300 MG capsule  Commonly known as:  NEURONTIN     ibuprofen 800 MG tablet  Commonly known as:  ADVIL,MOTRIN      TAKE these medications        acetaminophen-codeine 300-30 MG tablet  Commonly known as:  TYLENOL #3  Take 1 tablet by mouth every 4 (four) hours as needed.     butalbital-acetaminophen-caffeine 50-325-40-30 MG capsule  Commonly known as:  FIORICET/CODEINE  Take 1 capsule by mouth every 6 (six) hours as needed for headache.     lisinopril-hydrochlorothiazide 20-25 MG tablet  Commonly known as:  PRINZIDE,ZESTORETIC  Take 1 tablet by mouth daily.     pregabalin 75 MG capsule  Commonly known as:  LYRICA  Take 1 capsule (75 mg total) by mouth 2 (two) times daily. These take for 1 week     pregabalin 150 MG capsule  Commonly known as:  LYRICA  Take 1 capsule (150 mg total) by mouth 2 (two) times daily. Please start taking this medication on 10/03/2015.  Start taking  on:  10/03/2015     tizanidine 2 MG capsule  Commonly known as:  ZANAFLEX  Take 1 capsule (2 mg total) by mouth 2 (two) times daily. Start as 1 capsule at night x 2 weeks then increase as tolerated          Diet and Activity recommendation: See Discharge Instructions above   Consults obtained -  neurology   Major procedures and Radiology Reports - PLEASE review detailed and final reports for all details, in brief -     Dg Chest 2 View  09/25/2015  CLINICAL DATA:  Left-sided numbness, no chest pain today EXAM: CHEST  2 VIEW COMPARISON:  07/07/2013 FINDINGS: The heart size and mediastinal contours are within normal limits. Both lungs are clear. The visualized skeletal structures are unremarkable. IMPRESSION: No active cardiopulmonary disease. Electronically Signed   By: Skipper Cliche M.D.   On: 09/25/2015 17:52   Mr Brain Wo Contrast (neuro Protocol)  09/25/2015  CLINICAL DATA:  Sudden onset of left arm, leg, and face numbness last night, largely resolved. Left hand weakness. EXAM: MRI HEAD WITHOUT CONTRAST TECHNIQUE: Multiplanar, multiecho pulse sequences of the brain and surrounding structures were obtained without intravenous contrast. COMPARISON:  05/27/2014 head CT FINDINGS: A moderately enlarged empty sella is again seen. There is no evidence of acute infarct, mass, midline shift, or extra-axial fluid collection. Small foci of T2 hyperintensity in the subcortical and deep cerebral white matter bilaterally are nonspecific but compatible with mild chronic small vessel ischemic disease. There is mild global cerebral atrophy. Encephalomalacia and chronic blood products are noted in the posterior right MCA territory consistent with chronic hemorrhagic infarct involving the temporoparietal region. There is associated ex vacuo dilatation of the adjacent right lateral ventricle. Focal encephalomalacia and a small amount of chronic blood products are noted in the right frontal lobe consistent  with prior ventriculostomy catheter placement. Orbits are unremarkable. Paranasal  sinuses and mastoid air cells are clear. Major intracranial vascular flow voids are preserved. IMPRESSION: 1. No acute intracranial abnormality. 2. Chronic posterior right MCA infarct. Electronically Signed   By: Logan Bores M.D.   On: 09/25/2015 13:04    Micro Results     No results found for this or any previous visit (from the past 240 hour(s)).     Today   Subjective:   Liset Mcaleese today has no headache,no chest abdominal pain,no new weakness tingling or numbness, feels much better wants to go home today.  Objective:   Blood pressure 146/88, pulse 60, temperature 98 F (36.7 C), temperature source Oral, resp. rate 18, height 5\' 8"  (1.727 m), weight 116 kg (255 lb 11.7 oz), last menstrual period 11/24/2011, SpO2 100 %.   Intake/Output Summary (Last 24 hours) at 09/26/15 1416 Last data filed at 09/26/15 0601  Gross per 24 hour  Intake    490 ml  Output    850 ml  Net   -360 ml    Exam Awake Alert, Oriented x 3, No new F.N deficits, Normal affect [place].AT,PERRAL Supple Neck,No JVD, No cervical lymphadenopathy appriciated.  Symmetrical Chest wall movement, Good air movement bilaterally, CTAB RRR,No Gallops,Rubs or new Murmurs, No Parasternal Heave +ve B.Sounds, Abd Soft, Non tender, No organomegaly appriciated, No rebound -guarding or rigidity. No Cyanosis, Clubbing or edema, No new Rash or bruise  Data Review   CBC w Diff: Lab Results  Component Value Date   WBC 3.7* 09/25/2015   HGB 12.3 09/25/2015   HCT 38.6 09/25/2015   PLT 240 09/25/2015   LYMPHOPCT 50 09/25/2015   MONOPCT 7 09/25/2015   EOSPCT 4 09/25/2015   BASOPCT 1 09/25/2015    CMP: Lab Results  Component Value Date   NA 141 09/25/2015   K 3.5 09/25/2015   CL 102 09/25/2015   CO2 33* 09/25/2015   BUN 15 09/25/2015   CREATININE 1.03* 09/25/2015   CREATININE 1.01 06/16/2015   PROT 7.3 09/19/2013   ALBUMIN 4.0  09/19/2013   BILITOT 1.3* 09/19/2013   ALKPHOS 51 09/19/2013   AST 13 09/19/2013   ALT 8 09/19/2013  .   Total Time in preparing paper work, data evaluation and todays exam - 35 minutes  ELGERGAWY, DAWOOD M.D on 09/26/2015 at 2:16 PM  [facility]   Office  [phone]

## 2015-09-28 ENCOUNTER — Telehealth: Payer: Self-pay | Admitting: Adult Health

## 2015-09-28 ENCOUNTER — Telehealth: Payer: Self-pay | Admitting: Neurology

## 2015-09-28 NOTE — Telephone Encounter (Signed)
Per [facility] hospital referral request she be seen in 1wk from discharge for tingling in face and hand. Dr Leonie Man is booked until Feb. Please call and advise

## 2015-09-28 NOTE — Telephone Encounter (Signed)
Pt called sts she found hard copy of RX of Lyrica Dr Leonie Man wrote for her on 09/07/15. Please call and advise

## 2015-09-28 NOTE — Telephone Encounter (Signed)
Rn call patient to state Alison Ward in pharmacy did get the authorization for the Lyrica. Rn explain that Dr.Sethi did prescribed a paper prescription at her visit with him 2 weeks ago. Rn explain the authorization for Lyrica was the medication that was prescribed by the hospital MD. Pt stated she will pick up medication from [place], along with her other meds.

## 2015-09-28 NOTE — Telephone Encounter (Signed)
error 

## 2015-09-28 NOTE — Telephone Encounter (Signed)
Patient also stated that she never pick up the lyrica the hospital prescribed and the zanaflex. See previous notes from this month. Pt stated in note from last week that the pharmacy closes at 0600pm and she is not driving now. Pt stated she could never get a ride to the pharmacy. Pt stated the previous pharmacy was 20 -30 min away. Pt told Rn today to change it to [facility] on [address] in [place]. Pt stated the pharmacy is around the corner and she can drive short distances. Message was sent to Dr.Sethi and Burnetta Sabin about pts lyrica needing a prior authorization.

## 2015-09-28 NOTE — Telephone Encounter (Signed)
[facility]) has approved the request for coverage on Lyrica effective until 09/27/2017, or until the policy changes or is terminated.  Ref # [id number]

## 2015-09-28 NOTE — Telephone Encounter (Addendum)
Pt called sts pregabalin (LYRICA) 75 MG capsule and pregabalin (LYRICA) 150 MG capsule needs prior auth. Pt sts Dr Leonie Man discussed with hospitalist about her taking this medication when she was in hospital over the weekend. She sts [facility] on [address] has RX.

## 2015-09-28 NOTE — Telephone Encounter (Signed)
Rn call patient about her lyrica needing a pre authorization. Pt was just in the hospital over the weekend and was discharge. Rn explain that Dr.Sethi did not prescribed the medication. Pt stated a different hospitalist who was on call prescribed the lyrica. Pt stated she did not see Dr.Sethi or Dr. Erlinda Hong for her numbness or tingling. Rn stated a message will be sent to the hospital coordinator about her lyrica needing prior authorization.

## 2015-09-29 ENCOUNTER — Ambulatory Visit: Payer: 59 | Attending: Family Medicine | Admitting: Family Medicine

## 2015-09-29 ENCOUNTER — Ambulatory Visit (INDEPENDENT_AMBULATORY_CARE_PROVIDER_SITE_OTHER): Payer: 59

## 2015-09-29 ENCOUNTER — Encounter: Payer: Self-pay | Admitting: Family Medicine

## 2015-09-29 VITALS — BP 138/95 | HR 72 | Temp 98.0°F | Resp 14 | Ht 68.0 in | Wt 261.8 lb

## 2015-09-29 DIAGNOSIS — I10 Essential (primary) hypertension: Secondary | ICD-10-CM

## 2015-09-29 DIAGNOSIS — R200 Anesthesia of skin: Secondary | ICD-10-CM | POA: Diagnosis not present

## 2015-09-29 DIAGNOSIS — G44229 Chronic tension-type headache, not intractable: Secondary | ICD-10-CM | POA: Diagnosis not present

## 2015-09-29 DIAGNOSIS — R51 Headache: Secondary | ICD-10-CM | POA: Diagnosis not present

## 2015-09-29 DIAGNOSIS — G5712 Meralgia paresthetica, left lower limb: Secondary | ICD-10-CM | POA: Diagnosis not present

## 2015-09-29 DIAGNOSIS — Z8673 Personal history of transient ischemic attack (TIA), and cerebral infarction without residual deficits: Secondary | ICD-10-CM | POA: Diagnosis not present

## 2015-09-29 DIAGNOSIS — Z79899 Other long term (current) drug therapy: Secondary | ICD-10-CM | POA: Diagnosis not present

## 2015-09-29 MED ORDER — ASPIRIN EC 81 MG PO TBEC: 81.0000 mg | DELAYED_RELEASE_TABLET | Freq: Every day | ORAL | Status: DC

## 2015-09-29 MED ORDER — FLUTICASONE PROPIONATE 50 MCG/ACT NA SUSP: 2.0000 | Freq: Every day | NASAL | Status: DC

## 2015-09-29 NOTE — Progress Notes (Signed)
Recent hospital stay with CVA workup for left sided tingling Reports neuro thinks it is complicated migraines She was given Lyrica Rx but has not been able to pick it up at [facility] yet 7/10 sharp left sided headache pain currently States she has MRI today for  Leg pain

## 2015-09-29 NOTE — Progress Notes (Signed)
CC: Hospital follow-up.  HPI: Alison Ward is a 54 y.o. female right-handed female with a medical history of hypertension, chronic headaches, previous history of right  Hemorrhagic stroke status post ventriculostomy in 06/2013 (who is currently followed by [facility]-Dr. Leonie Man) who comes in here for follow-up visit   She was hospitalized at [facility] from 09/25/15 - 09/26/15 after she had presented with left hand numbness and weakness. She had an MRI which revealed no evidence of an acute stroke, neurology consult was placed and symptoms were thought to be consistent with migraine aura versus seizure and she was placed on Lyrica with recommendations to follow-up with neurology and also have an EEG outpatient.  Interval history: She complains of tingling and numbness in her left cheek especially if she has been talking for a while, also has numbness in her left leg but numbness in the left  is more pronounced and is worse at night. She also has intermittent headaches which she had mentioned to the nurse with a 7 out of 10 but tells me it has resolved at this time. Headaches are usually frontal and we did tell both sides of her face, she admits to postnasal drip as well; denies blurry vision, nausea or vomiting. She has upcoming appointments for EEG and with the neurologist. Patient has No headache, No chest pain, No abdominal pain - No Nausea, No new weakness tingling or numbness, No Cough - SOB.  Allergies  Allergen Reactions  . Elavil [Amitriptyline] Other (See Comments)    DIZZINESS   Past Medical History  Diagnosis Date  . Hypertension   . Heart murmur   . Stroke ([place]) 07/05/2013  . Chronic headaches     daily, bitemporal, associated with tingling in face   Current Outpatient Prescriptions on File Prior to Visit  Medication Sig Dispense Refill  . lisinopril-hydrochlorothiazide (PRINZIDE,ZESTORETIC) 20-25 MG per tablet Take 1 tablet by mouth daily. 90  tablet 1  . [START ON 10/03/2015] pregabalin (LYRICA) 150 MG capsule Take 1 capsule (150 mg total) by mouth 2 (two) times daily. Please start taking this medication on 10/03/2015. 60 capsule 0  . pregabalin (LYRICA) 75 MG capsule Take 1 capsule (75 mg total) by mouth 2 (two) times daily. These take for 1 week 14 capsule 0   No current facility-administered medications on file prior to visit.   Family History  Problem Relation Age of Onset  . Hypertension Mother   . Hypertension Father   . Diabetes Daughter   . Colon cancer Neg Hx    Social History   Social History  . Marital Status: Divorced    Spouse Name: N/A  . Number of Children: 3  . Years of Education: college   Occupational History  . [employer]    Social History Main Topics  . Smoking status: Never Smoker   . Smokeless tobacco: Never Used  . Alcohol Use: No  . Drug Use: No  . Sexual Activity: Not on file   Other Topics Concern  . Not on file   Social History Narrative    Review of Systems: Constitutional: Negative for fever, chills, diaphoresis, activity change, appetite change and fatigue. HENT: Negative for ear pain, nosebleeds, congestion, facial swelling, rhinorrhea, neck pain, neck stiffness and ear discharge, positive for post nasal drip Eyes: Negative for pain, discharge, redness, itching and visual disturbance. Respiratory: Negative for cough, choking, chest tightness, shortness of breath, wheezing and stridor.  Cardiovascular: Negative for chest pain, palpitations and  leg swelling. Gastrointestinal: Negative for abdominal distention. Genitourinary: Negative for dysuria, urgency, frequency, hematuria, flank pain, decreased urine volume, difficulty urinating and dyspareunia.  Musculoskeletal: Negative for back pain, joint swelling, arthralgias and gait problem. Neurological: Negative for dizziness, tremors, seizures, syncope, facial asymmetry, speech difficulty, light-headedness, positive for  left-sided numbness and left hand weakness, positive for headaches Hematological: Negative for adenopathy. Does not bruise/bleed easily. Psychiatric/Behavioral: Negative for hallucinations, behavioral problems, confusion, dysphoric mood, decreased concentration and agitation.    Objective:   Filed Vitals:   09/29/15 1034  BP: 138/95  Pulse: 72  Temp: 98 F (36.7 C)  Resp: 14    Physical Exam: Constitutional: Patient appears well-developed and well-nourished. No distress. HENT: Normocephalic, atraumatic, External right and left ear normal. Oropharynx is clear and moist.  Eyes: Conjunctivae and EOM are normal. PERRLA, no scleral icterus. Neck: Normal ROM. Neck supple. No JVD. No tracheal deviation. No thyromegaly. CVS: RRR, S1/S2 +, no murmurs, no gallops, no carotid bruit.  Pulmonary: Effort and breath sounds normal, no stridor, rhonchi, wheezes, rales.  Abdominal: Soft. BS +,  no distension, tenderness, rebound or guarding.  Musculoskeletal: Normal range of motion. No edema and no tenderness.  Lymphadenopathy: No lymphadenopathy noted, cervical, inguinal or axillary Neuro: Alert. Normal reflexes, motor strength is 4/5 in left upper extremity, 5/5 in left lower extremity, 5/5 in right upper extremity, 5/5 in right lower extremity. Skin: Skin is warm and dry. No rash noted. Not diaphoretic. No erythema. No pallor. Psychiatric: Normal mood and affect. Behavior, judgment, thought content normal.  Lab Results  Component Value Date   WBC 3.7* 09/25/2015   HGB 12.3 09/25/2015   HCT 38.6 09/25/2015   MCV 88.9 09/25/2015   PLT 240 09/25/2015   Lab Results  Component Value Date   CREATININE 1.03* 09/25/2015   BUN 15 09/25/2015   NA 141 09/25/2015   K 3.5 09/25/2015   CL 102 09/25/2015   CO2 33* 09/25/2015    Lab Results  Component Value Date   HGBA1C 5.80 07/08/2015   Lipid Panel     Component Value Date/Time   CHOL 155 09/19/2013 1309   TRIG 167* 09/19/2013 1309   HDL  41 09/19/2013 1309   CHOLHDL 3.8 09/19/2013 1309   VLDL 33 09/19/2013 1309   LDLCALC 81 09/19/2013 1309       Assessment and plan:  Left-sided numbness: Patient is yet to pick up Lyrica prescription which she has been advised to do. Cannot exclude TIA and so I have placed her on aspirin. Advised to try using a left wrist brace at night since symptoms are worse then. Scheduled for EEG next month and will be seeing neurology in January. ED precautions discussed.  Headache: Due to her complaints of postnasal drip I am placing her on Flonase in the event that this could be sinus related. Thought to be possible migraine as per neurology  Hypertension: Mild diastolic elevation. Continue lisinopril/HCTZ      Arnoldo Morale, MD. [facility] [phone] 09/29/2015, 11:40 AM

## 2015-10-04 ENCOUNTER — Ambulatory Visit (INDEPENDENT_AMBULATORY_CARE_PROVIDER_SITE_OTHER): Payer: 59 | Admitting: Neurology

## 2015-10-04 ENCOUNTER — Encounter: Payer: Self-pay | Admitting: Neurology

## 2015-10-04 DIAGNOSIS — R55 Syncope and collapse: Secondary | ICD-10-CM | POA: Diagnosis not present

## 2015-10-04 NOTE — Progress Notes (Signed)
[facility] [address] [place] [address] [phone]       OFFICE FOLLOW UP VISIT NOTE  Ms. 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 She states she was sitting on a stool when she fainted and fell off and hit the back of her head. She did not lose consciousness for long and did not have any focal symptoms. EMS were called but patient chose not to go to the hospital and seek any help. Since then she's been having headaches which were initially mild and responded to Tylenol and ibuprofen but for the last couple of months the headaches are now occurring on a daily basis. She describes the headaches as variable from mild dull headaches to severe throbbing headaches which vary in severity from 2/10-7/10.Headache is not accompanied by nausea, vomiting or photophobia but she is bothered by loud sounds. Headaches do increase with physical activity and stress and are relieved by sleep and rest. She has been taking ibuprofen 800 mg once or twice a day with only partial relief. She has recently been given a prescription of Tylenol with Codeine but she has not started taking it yet. Patient denies any visual symptoms or focal neurological symptoms accompanying her headaches. She has not had any recent brain imaging study. She denies any neck pain and raducular pain but does complain of mild tightness of the neck and shoulder muscles. 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 She required ventriculostomy at that time. She did recover from that quite well but did  have headaches which lasted for 6 months to year and then gradually  the headaches disappeared and she did quite well for nearly a year without any headaches. She was tried on a variety of headache medications at that time including Depakote, Topamax, amitriptyline but these did not work or she had side effects. Tizanidine probably helped her the most. She also has a new complaint now of left leg numbness which she describes as tingling sensation involving the anterior and lateral   aspect of her left thigh. This also began since her fall. She has seen her primary physician who has prescribed gabapentin which she takes 300mg  twice daily but it makes her dizzy and does not particularly help her numbness. She denies any severe back pain or radicular pain or trouble with walking, balance or bladder control. She has not had any back x-rays or MRI.  ROS:   14 system review of systems is positive for  blurred vision, aching muscles, memory loss, confusion, headache, numbness, weakness, slurred speech, dizziness, passing out and all other systems negative PMH:  Past Medical History  Diagnosis Date  . Hypertension   . Heart murmur   . Stroke ([place]) 07/05/2013  . Chronic headaches     daily, bitemporal, associated with tingling in face    Social History:  Social History   Social History  . Marital Status: Divorced    Spouse Name: N/A  . Number of Children: 3  . Years of Education: college   Occupational History  . [employer]    Social History Main Topics  .  Smoking status: Never Smoker   . Smokeless tobacco: Never Used  . Alcohol Use: No  . Drug Use: No  . Sexual Activity: Not on file   Other Topics Concern  . Not on file   Social History Narrative    Medications:   Current Outpatient Prescriptions on File Prior to Visit  Medication Sig Dispense Refill  . aspirin EC 81 MG tablet Take 1 tablet (81 mg total) by mouth daily. 30 tablet 1  . fluticasone (FLONASE) 50 MCG/ACT nasal  spray Place 2 sprays into both nostrils daily. 16 g 1  . lisinopril-hydrochlorothiazide (PRINZIDE,ZESTORETIC) 20-25 MG per tablet Take 1 tablet by mouth daily. 90 tablet 1  . pregabalin (LYRICA) 150 MG capsule Take 1 capsule (150 mg total) by mouth 2 (two) times daily. Please start taking this medication on 10/03/2015. 60 capsule 0  . pregabalin (LYRICA) 75 MG capsule Take 1 capsule (75 mg total) by mouth 2 (two) times daily. These take for 1 week 14 capsule 0   No current facility-administered medications on file prior to visit.    Allergies:   Allergies  Allergen Reactions  . Elavil [Amitriptyline] Other (See Comments)    DIZZINESS    Physical Exam General: Obese middle-aged African-American lady, seated, in no evident distress Head: head normocephalic and atraumatic.   Neck: supple with no carotid or supraclavicular bruits Cardiovascular: regular rate and rhythm, no murmurs Musculoskeletal: no deformity Skin:  no rash/petichiae Vascular:  Normal pulses all extremities  Neurologic Exam Mental Status: Awake and fully alert. Oriented to place and time. Recent and remote memory intact. Attention span, concentration and fund of knowledge appropriate. Mood and affect appropriate. Recall 3/3. Animal naming test 9. Cranial Nerves: Fundoscopic exam reveals sharp disc margins. Pupils equal, briskly reactive to light. Extraocular movements full without nystagmus. Visual fields full to confrontation. Hearing intact. Facial sensation intact. Face, tongue, palate moves normally and symmetrically.  Motor: Normal bulk and tone. Normal strength in all tested extremity muscles. Diminished fine finger movements on the left. Orbits right over left approximately. Minimum action tremor of the left outstretched approximately. Absent tremor at rest. Sensory.: intact  position and vibratory sensation. Subjective decrease touch and pinprick sensation in the left thigh on the anterior lateral aspect of the  knee. Coordination: Rapid alternating movements normal in all extremities. Finger-to-nose and heel-to-shin performed accurately bilaterally. Gait and Station: Arises from chair without difficulty. Stance is normal. Gait demonstrates normal stride length and balance . Able to heel, toe and tandem walk without difficulty.  Reflexes: 1+ and symmetric. Toes downgoing.       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 I recommend trial of Zanaflex 2 mg at night to be increase if tolerated after 2 weeks to twice daily. I have discussed possible side effects with the patient advised to call me if needed. I have also encouraged her to do daily neck stretching exercises as well as increased participation in regular activities for stress relaxation-like exercise, swimming, medication and yoga. Check MRI scan of the brain as well as lumbar spine and trial of Lyrica 50 mg twice daily for her neuropathic pain in the left thigh. She will return  for follow-up in 2 months or call earlier if necessary. Antony Contras, MD  Note: This document was prepared with digital dictation and possible smart phrase technology. Any transcriptional errors that result from this process are unintentional.

## 2015-10-06 ENCOUNTER — Telehealth: Payer: Self-pay

## 2015-10-06 ENCOUNTER — Other Ambulatory Visit: Payer: Self-pay | Admitting: Neurology

## 2015-10-06 DIAGNOSIS — IMO0002 Reserved for concepts with insufficient information to code with codable children: Secondary | ICD-10-CM

## 2015-10-06 NOTE — Addendum Note (Signed)
Addended by: Marval Regal on: 10/06/2015 11:34 AM   Modules accepted: Orders

## 2015-10-06 NOTE — Telephone Encounter (Signed)
Called patient to inform the patient that EEG study was normal. Patient verbalized understanding.

## 2015-10-07 ENCOUNTER — Other Ambulatory Visit: Payer: 59

## 2015-10-15 ENCOUNTER — Emergency Department (HOSPITAL_COMMUNITY): Payer: 59

## 2015-10-15 ENCOUNTER — Emergency Department (HOSPITAL_COMMUNITY)
Admission: EM | Admit: 2015-10-15 | Discharge: 2015-10-15 | Disposition: A | Discharge status: Home / Self Care | Payer: 59 | Attending: Emergency Medicine | Admitting: Emergency Medicine

## 2015-10-15 ENCOUNTER — Other Ambulatory Visit: Payer: 59

## 2015-10-15 ENCOUNTER — Encounter (HOSPITAL_COMMUNITY): Payer: Self-pay | Admitting: Emergency Medicine

## 2015-10-15 DIAGNOSIS — N839 Noninflammatory disorder of ovary, fallopian tube and broad ligament, unspecified: Secondary | ICD-10-CM | POA: Insufficient documentation

## 2015-10-15 DIAGNOSIS — Z79899 Other long term (current) drug therapy: Secondary | ICD-10-CM | POA: Insufficient documentation

## 2015-10-15 DIAGNOSIS — Z8673 Personal history of transient ischemic attack (TIA), and cerebral infarction without residual deficits: Secondary | ICD-10-CM | POA: Insufficient documentation

## 2015-10-15 DIAGNOSIS — N838 Other noninflammatory disorders of ovary, fallopian tube and broad ligament: Secondary | ICD-10-CM

## 2015-10-15 DIAGNOSIS — R011 Cardiac murmur, unspecified: Secondary | ICD-10-CM | POA: Insufficient documentation

## 2015-10-15 DIAGNOSIS — Z7982 Long term (current) use of aspirin: Secondary | ICD-10-CM | POA: Insufficient documentation

## 2015-10-15 DIAGNOSIS — R1900 Intra-abdominal and pelvic swelling, mass and lump, unspecified site: Secondary | ICD-10-CM

## 2015-10-15 DIAGNOSIS — R109 Unspecified abdominal pain: Secondary | ICD-10-CM

## 2015-10-15 DIAGNOSIS — I10 Essential (primary) hypertension: Secondary | ICD-10-CM | POA: Insufficient documentation

## 2015-10-15 LAB — COMPREHENSIVE METABOLIC PANEL
ALT: 15 U/L (ref 14–54)
ANION GAP: 11 (ref 5–15)
AST: 23 U/L (ref 15–41)
Albumin: 4 g/dL (ref 3.5–5.0)
Alkaline Phosphatase: 53 U/L (ref 38–126)
BUN: 14 mg/dL (ref 6–20)
CHLORIDE: 101 mmol/L (ref 101–111)
CO2: 27 mmol/L (ref 22–32)
CREATININE: 1.09 mg/dL — AB (ref 0.44–1.00)
Calcium: 9.9 mg/dL (ref 8.9–10.3)
GFR, EST NON AFRICAN AMERICAN: 56 mL/min — AB (ref >60)
Glucose, Bld: 143 mg/dL — ABNORMAL HIGH (ref 65–99)
POTASSIUM: 3.2 mmol/L — AB (ref 3.5–5.1)
SODIUM: 139 mmol/L (ref 135–145)
Total Bilirubin: 1.2 mg/dL (ref 0.3–1.2)
Total Protein: 8.1 g/dL (ref 6.5–8.1)

## 2015-10-15 LAB — WET PREP, GENITAL
CLUE CELLS WET PREP: NONE SEEN
SPERM: NONE SEEN
Trich, Wet Prep: NONE SEEN
YEAST WET PREP: NONE SEEN

## 2015-10-15 LAB — CBC
HEMATOCRIT: 39.5 % (ref 36.0–46.0)
HEMOGLOBIN: 12.9 g/dL (ref 12.0–15.0)
MCH: 28.2 pg (ref 26.0–34.0)
MCHC: 32.7 g/dL (ref 30.0–36.0)
MCV: 86.4 fL (ref 78.0–100.0)
PLATELETS: 271 10*3/uL (ref 150–400)
RBC: 4.57 MIL/uL (ref 3.87–5.11)
RDW: 15.2 % (ref 11.5–15.5)
WBC: 6.6 10*3/uL (ref 4.0–10.5)

## 2015-10-15 LAB — URINALYSIS, ROUTINE W REFLEX MICROSCOPIC
Bilirubin Urine: NEGATIVE
GLUCOSE, UA: NEGATIVE mg/dL
Hgb urine dipstick: NEGATIVE
Ketones, ur: NEGATIVE mg/dL
LEUKOCYTES UA: NEGATIVE
Nitrite: NEGATIVE
PROTEIN: NEGATIVE mg/dL
SPECIFIC GRAVITY, URINE: 1.013 (ref 1.005–1.030)
pH: 8.5 — ABNORMAL HIGH (ref 5.0–8.0)

## 2015-10-15 LAB — LIPASE, BLOOD: LIPASE: 36 U/L (ref 11–51)

## 2015-10-15 MED ORDER — SODIUM CHLORIDE 0.9 % IV BOLUS (SEPSIS)
1000.0000 mL | Freq: Once | INTRAVENOUS | Status: AC
  Administered 2015-10-15: 1000 mL via INTRAVENOUS

## 2015-10-15 MED ORDER — POTASSIUM CHLORIDE CRYS ER 20 MEQ PO TBCR
40.0000 meq | EXTENDED_RELEASE_TABLET | Freq: Once | ORAL | Status: AC
  Administered 2015-10-15: 40 meq via ORAL
  Filled 2015-10-15: qty 2

## 2015-10-15 MED ORDER — HYDROMORPHONE HCL 1 MG/ML IJ SOLN
1.0000 mg | Freq: Once | INTRAMUSCULAR | Status: AC
  Administered 2015-10-15: 1 mg via INTRAVENOUS
  Filled 2015-10-15: qty 1

## 2015-10-15 MED ORDER — OXYCODONE-ACETAMINOPHEN 5-325 MG PO TABS: 2.0000 | ORAL_TABLET | ORAL | Status: DC | PRN

## 2015-10-15 MED ORDER — IOHEXOL 300 MG/ML  SOLN
100.0000 mL | Freq: Once | INTRAMUSCULAR | Status: AC | PRN
  Administered 2015-10-15: 100 mL via INTRAVENOUS

## 2015-10-15 MED ORDER — ONDANSETRON HCL 4 MG/2ML IJ SOLN
4.0000 mg | Freq: Once | INTRAMUSCULAR | Status: AC
  Administered 2015-10-15: 4 mg via INTRAVENOUS
  Filled 2015-10-15: qty 2

## 2015-10-15 MED ORDER — LORAZEPAM 2 MG/ML IJ SOLN
1.0000 mg | Freq: Once | INTRAMUSCULAR | Status: AC
  Administered 2015-10-15: 1 mg via INTRAVENOUS
  Filled 2015-10-15: qty 1

## 2015-10-15 MED ORDER — HYDROMORPHONE HCL 1 MG/ML IJ SOLN
0.5000 mg | Freq: Once | INTRAMUSCULAR | Status: AC
  Administered 2015-10-15: 0.5 mg via INTRAVENOUS
  Filled 2015-10-15: qty 1

## 2015-10-15 MED ORDER — IOHEXOL 300 MG/ML  SOLN
50.0000 mL | Freq: Once | INTRAMUSCULAR | Status: AC | PRN
  Administered 2015-10-15: 50 mL via ORAL

## 2015-10-15 NOTE — ED Notes (Addendum)
Pt reports L side abd pain that began last night. Had emesis this am. Denies diarrhea or flank pain. LBM yesterday.

## 2015-10-15 NOTE — ED Provider Notes (Signed)
CSN: [id number]     Arrival date & time 10/15/15  [id number] History   First MD Initiated Contact with Patient 10/15/15 [phone]     Chief Complaint  Patient presents with  . Abdominal Pain     (Consider location/radiation/quality/duration/timing/severity/associated sxs/prior Treatment) HPI   Blood pressure 128/93, pulse 73, temperature 98.7 F (37.1 C), temperature source Oral, resp. rate 18, last menstrual period 11/24/2011, SpO2 100 %.  Alison Ward is a 54 y.o. female with past medical history significant for hypertension, hemorrhagic stroke complaining of abdominal pain onset last night initially mild, worsening to 10 out of 10 severity and associated with one episode of nonbloody, nonbilious, non-coffee ground emesis. No prior abdominal surgeries, no history of diverticulosis/diverticulitis or kidney stones. Patient has abnormal vaginal discharge which she describes as clear profuse. She denies fever, chills, change in urination or bowel movements, dysuria, hematuria, chest pain, shortness of breath. States she is passing flatus.  Past Medical History  Diagnosis Date  . Hypertension   . Heart murmur   . Stroke ([place]) 07/05/2013  . Chronic headaches     daily, bitemporal, associated with tingling in face   Past Surgical History  Procedure Laterality Date  . Ventriculostomy Left 07/07/2013    Procedure: VENTRICULOSTOMY;  Surgeon: Ophelia Charter, MD;  Location: [facility];  Service: Neurosurgery;  Laterality: Left;  Left Ventriculostomy and Removal of Right Vetriculostomy   Family History  Problem Relation Age of Onset  . Hypertension Mother   . Hypertension Father   . Diabetes Daughter   . Colon cancer Neg Hx    Social History  Substance Use Topics  . Smoking status: Never Smoker   . Smokeless tobacco: Never Used  . Alcohol Use: No   OB History    No data available     Review of Systems  10 systems reviewed and found to be negative, except as noted in the  HPI.   Allergies  Elavil  Home Medications   Prior to Admission medications   Medication Sig Start Date End Date Taking? Authorizing Provider  acetaminophen (TYLENOL) 500 MG tablet Take 1,000 mg by mouth every 6 (six) hours as needed for moderate pain.   Yes Historical Provider, MD  gabapentin (NEURONTIN) 300 MG capsule Take 300 mg by mouth 3 (three) times daily.   Yes Historical Provider, MD  lisinopril-hydrochlorothiazide (PRINZIDE,ZESTORETIC) 20-25 MG per tablet Take 1 tablet by mouth daily. 06/16/15  Yes Arnoldo Morale, MD  aspirin EC 81 MG tablet Take 1 tablet (81 mg total) by mouth daily. Patient not taking: Reported on 10/15/2015 09/29/15   Arnoldo Morale, MD  fluticasone (FLONASE) 50 MCG/ACT nasal spray Place 2 sprays into both nostrils daily. Patient not taking: Reported on 10/15/2015 09/29/15   Arnoldo Morale, MD  oxyCODONE-acetaminophen (PERCOCET/ROXICET) 5-325 MG tablet Take 2 tablets by mouth every 4 (four) hours as needed for severe pain. 10/15/15   Brinly Maietta, PA-C  pregabalin (LYRICA) 150 MG capsule Take 1 capsule (150 mg total) by mouth 2 (two) times daily. Please start taking this medication on 10/03/2015. Patient not taking: Reported on 10/15/2015 10/03/15   Silver Huguenin Elgergawy, MD  pregabalin (LYRICA) 75 MG capsule Take 1 capsule (75 mg total) by mouth 2 (two) times daily. These take for 1 week Patient not taking: Reported on 10/15/2015 09/26/15   Silver Huguenin Elgergawy, MD   BP 109/71 mmHg  Pulse 62  Temp(Src) 97.8 F (36.6 C) (Oral)  Resp 13  SpO2 93%  LMP 11/24/2011  Physical Exam  Constitutional: She is oriented to person, place, and time. She appears well-developed and well-nourished.  Appears acutely uncomfortable, eyes closed tightly, crying and very still on the bed  HENT:  Head: Normocephalic.  Eyes: Conjunctivae and EOM are normal.  Cardiovascular: Normal rate, regular rhythm and intact distal pulses.   Pulmonary/Chest: Effort normal and breath sounds normal.  No stridor.  Abdominal: Soft.  Bowel sounds difficult to auscultate secondary to habitus. Exquisite tenderness to palpation along the left upper and lower quadrant, voluntary guarding  Genitourinary:  No CVA tenderness to percussion  Pelvic exam a chaperoned by EMT: No rashes or lesions, there is a yellowish, homogenous, non-foul-smelling vaginal discharge, there is no cervical motion or adnexal tenderness  Musculoskeletal: Normal range of motion.  Neurological: She is alert and oriented to person, place, and time.  Psychiatric: She has a normal mood and affect.  Nursing note and vitals reviewed.   ED Course  Procedures (including critical care time) Labs Review Labs Reviewed  WET PREP, GENITAL - Abnormal; Notable for the following:    WBC, Wet Prep HPF POC FEW (*)    All other components within normal limits  COMPREHENSIVE METABOLIC PANEL - Abnormal; Notable for the following:    Potassium 3.2 (*)    Glucose, Bld 143 (*)    Creatinine, Ser 1.09 (*)    GFR calc non Af Amer 56 (*)    All other components within normal limits  URINALYSIS, ROUTINE W REFLEX MICROSCOPIC (NOT AT [facility]) - Abnormal; Notable for the following:    pH 8.5 (*)    All other components within normal limits  LIPASE, BLOOD  CBC  GC/CHLAMYDIA PROBE AMP ([place]) NOT AT [facility]    Imaging Review US Transvaginal Non-ob  10/15/2015  CLINICAL DATA:  54 year old female with superior right 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 Pelvic pain. Initial encounter. EXAM: TRANSABDOMINAL AND TRANSVAGINAL ULTRASOUND OF PELVIS TECHNIQUE: Both transabdominal and transvaginal ultrasound examinations of the pelvis were performed. Transabdominal technique was performed for global imaging of the pelvis including uterus, ovaries, adnexal regions, and pelvic cul-de-sac. It was necessary to proceed with endovaginal exam following the transabdominal exam to visualize the ovaries. Color and duplex Doppler  ultrasound was utilized to evaluate blood flow to the ovaries. COMPARISON:  None CT Abdomen and Pelvis 1204 hours today. FINDINGS: Uterus Measurements: 9.7 x 5.4 x 6.5 cm. Fundal intramural fibroid measuring 3.3 cm diameter (image 42). Small posterior sub serosal fibroid measuring 1.9 cm diameter (image 52). Endometrium Thickness: 9 mm.  No focal abnormality visualized. Right ovary Measurements: Large partially cystic partially solid right adnexal region mass partially re- demonstrated, better imaged by CT. Estimated at 12.1 cm diameter. No normal ovarian parenchyma could be identified. Left ovary Measurements: 2.7 x 1.9 x 1.9 cm. Only visible transabdominally. Normal appearance/no adnexal mass. Pulsed Doppler evaluation of the ovaries could not be performed despite attempts; no normal right ovarian parenchyma could be identified, and due to pain the patient declined attempts at left ovary Doppler. Other findings: No pelvic free fluid. IMPRESSION: 1. 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 No normal right ovarian parenchyma could be identified. Recommend GYN Surgery consultation. 2. Normal grayscale appearance of the left ovary. Doppler evaluation of the ovaries was not performed as above. 3. No pelvic free fluid. 4. Small intramural and sub serosal urine fibroids. Electronically Signed   By: Genevie Ann M.D.   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 Pelvic pain. Initial encounter. EXAM: TRANSABDOMINAL AND TRANSVAGINAL ULTRASOUND OF PELVIS TECHNIQUE: Both transabdominal and transvaginal ultrasound examinations of the pelvis were performed. Transabdominal technique was performed for global imaging of the pelvis including uterus, ovaries, adnexal regions, and pelvic cul-de-sac. It was necessary to proceed with endovaginal exam following  the transabdominal exam to visualize the ovaries. Color and duplex Doppler ultrasound was utilized to evaluate blood flow to the ovaries. COMPARISON:  None CT Abdomen and Pelvis 1204 hours today. FINDINGS: Uterus Measurements: 9.7 x 5.4 x 6.5 cm. Fundal intramural fibroid measuring 3.3 cm diameter (image 42). Small posterior sub serosal fibroid measuring 1.9 cm diameter (image 52). Endometrium Thickness: 9 mm.  No focal abnormality visualized. Right ovary Measurements: Large partially cystic partially solid right adnexal region mass partially re- demonstrated, better imaged by CT. Estimated at 12.1 cm diameter. No normal ovarian parenchyma could be identified. Left ovary Measurements: 2.7 x 1.9 x 1.9 cm. Only visible transabdominally. Normal appearance/no adnexal mass. Pulsed Doppler evaluation of the ovaries could not be performed despite attempts; no normal right ovarian parenchyma could be identified, and due to pain the patient declined attempts at left ovary Doppler. Other findings: No pelvic free fluid. IMPRESSION: 1. 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 No normal right ovarian parenchyma could be identified. Recommend GYN Surgery consultation. 2. Normal grayscale appearance of the left ovary. Doppler evaluation of the ovaries was not performed as above. 3. No pelvic free fluid. 4. Small intramural and sub serosal urine fibroids. Electronically Signed   By: Genevie Ann M.D.   On: 10/15/2015 14:44   Ct Abdomen Pelvis W Contrast  10/15/2015  CLINICAL DATA:  Left-sided pain a began last night. Emesis this morning. EXAM: CT ABDOMEN AND PELVIS WITH CONTRAST TECHNIQUE: Multidetector CT imaging of the abdomen and pelvis was performed using the standard protocol following bolus administration of intravenous contrast. CONTRAST:  189mL OMNIPAQUE IOHEXOL 300 MG/ML SOLN, 44mL OMNIPAQUE IOHEXOL 300 MG/ML SOLN COMPARISON:  None. FINDINGS: Lower chest:  Clear lung bases.  Normal  heart size. Hepatobiliary: Normal liver. Cholelithiasis. No intrahepatic or extrahepatic biliary ductal dilatation. Pancreas: Normal. Spleen: Normal. Adrenals/Urinary Tract: Normal adrenal glands. Normal kidneys. No urolithiasis or obstructive uropathy. Decompressed bladder. Stomach/Bowel: No bowel wall thickening or dilatation. No pneumatosis, pneumoperitoneum or portal venous gas. No abdominal or pelvic free fluid. Normal appendix. Wide-mouth umbilical hernia containing a loop of nondilated small bowel. There is a small fat containing supraumbilical hernia. Vascular/Lymphatic: Normal caliber abdominal aorta with atherosclerosis. No lymphadenopathy. Reproductive: Heterogeneous uterus with a ill-defined mass near the fundus most consistent with a uterine fibroid. 10.8 x 8.7 x 11.8 cm complex mass in the right-sided the pelvis with areas of fat density, other areas of soft tissue density and also calcifications. The mass abuts the right ovary. Other: No fluid collection or hematoma. Musculoskeletal: No acute osseous abnormality. No aggressive lytic or sclerotic osseous lesion. Degenerative disc disease and bilateral facet arthropathy at L4-5. IMPRESSION: 1. 10.8 x 8.7 x 11.8 cm complex mass along the right side of the pelvis abutting the right ovary most concerning for a ovarian teratoma. The size of the mass can predispose to ovarian torsion. Gyn consultation is recommended. 2. Fibroid uterus. 3. Wide-mouth umbilical hernia containing a loop of nondilated small bowel. 4. Cholelithiasis. Electronically Signed   By: Kathreen Devoid   On:  10/15/2015 12:25   I have personally reviewed and evaluated these images and lab results as part of my medical decision-making.   EKG Interpretation None      MDM   Final diagnoses:  Left sided abdominal pain  Ovarian mass, right    Filed Vitals:   10/15/15 1119 10/15/15 1229 10/15/15 1422 10/15/15 1545  BP: 123/77 138/90 137/79 109/71  Pulse: 60 63 60 62  Temp:  97.4  F (36.3 C) 97.9 F (36.6 C) 97.8 F (36.6 C)  TempSrc:  Oral Oral Oral  Resp: 18 18 17 13   SpO2: 96% 99% 100% 93%    Medications  HYDROmorphone (DILAUDID) injection 0.5 mg (0.5 mg Intravenous Given 10/15/15 1017)  ondansetron (ZOFRAN) injection 4 mg (4 mg Intravenous Given 10/15/15 1017)  HYDROmorphone (DILAUDID) injection 1 mg (1 mg Intravenous Given 10/15/15 1048)  sodium chloride 0.9 % bolus 1,000 mL (1,000 mLs Intravenous New Bag/Given 10/15/15 1224)  potassium chloride SA (K-DUR,KLOR-CON) CR tablet 40 mEq (40 mEq Oral Given 10/15/15 1224)  iohexol (OMNIPAQUE) 300 MG/ML solution 50 mL (50 mLs Oral Contrast Given 10/15/15 1020)  iohexol (OMNIPAQUE) 300 MG/ML solution 100 mL (100 mLs Intravenous Contrast Given 10/15/15 1155)  HYDROmorphone (DILAUDID) injection 1 mg (1 mg Intravenous Given 10/15/15 1234)  LORazepam (ATIVAN) injection 1 mg (1 mg Intravenous Given 10/15/15 1426)  HYDROmorphone (DILAUDID) injection 1 mg (1 mg Intravenous Given 10/15/15 1426)    Alison Ward is 54 y.o. female presenting with severe left-sided abdominal pain associated with single episode of emesis, this is worsened precipitously over the last day. No history of kidney stones. Vital signs reassuring, abdominal exam is nonsurgical. Will check basic blood work, CT abdomen pelvis. She is also reporting abnormal vaginal discharge will perform pelvic exam as well.   Pelvic exam with discharge, no signs of PID. She is a mild hypokalemia this is likely secondary to her hydrochlorothiazide.   Patient continues to require high doses of Dilaudid to control her pain. CT shows a greater than 10 cm teratoma contiguous with the right ovary, patient states she was aware of this, it was incidentally noted on an MRI to evaluate her low back pain. It appears that she was scheduled for CAT scan this afternoon for further characterization. States that she is not having any right-sided pain, repeat abdominal exam shows  tenderness on the left side. Will obtain ultrasound to further evaluate.   Ultrasound again shows the right-sided ovarian mass with normal blood flow bilaterally.  OB/GYN consult from Kerry Hough, PA-C working with Dr. Roselie Awkward, we discussed the case and she would not seem reason for emergent intervention, however it is very possible that the ovary will torsed with the size of the mass, advised extensive return precautions for torsion. Will call her on Monday to set up a time for preop appointment. This patient is comfortable going home, will write her prescription for Percocet, she is to return to the ED immediately for new symptoms or left-sided abdominal pain uncontrolled with Percocet. She is to go directly to [facility] hospital if she develops any right-sided pain.   Discussed case with attending physician who agrees with care plan and disposition.   Evaluation does not show pathology that would require ongoing emergent intervention or inpatient treatment. Pt is hemodynamically stable and mentating appropriately. Discussed findings and plan with patient/guardian, who agrees with care plan. All questions answered. Return precautions discussed and outpatient follow up given.   New Prescriptions   OXYCODONE-ACETAMINOPHEN (PERCOCET/ROXICET) 5-325 MG TABLET  Take 2 tablets by mouth every 4 (four) hours as needed for severe pain.         Monico Blitz, PA-C 10/15/15 1549  Lacretia Leigh, MD 10/18/15 2233

## 2015-10-15 NOTE — ED Notes (Signed)
Patient transported to Ultrasound 

## 2015-10-15 NOTE — Discharge Instructions (Signed)
Take percocet for breakthrough pain, do not drink alcohol, drive, care for children or do other critical tasks while taking percocet.  If your left-sided abdominal pain worsens or you develop new symptoms please return immediately to the emergency room, if you develop right-sided abdominal [facility] They will call you on Monday to set up an appointment.  Please follow with your primary care doctor in the next 2 days for a check-up. They must obtain records for further management.   Do not hesitate to return to the Emergency Department for any new, worsening or concerning symptoms.

## 2015-10-15 NOTE — ED Notes (Signed)
Bed: WA06 Expected date:  Expected time:  Means of arrival:  Comments: Ems- abd pain 

## 2015-10-15 NOTE — ED Notes (Addendum)
PA at bedside.

## 2015-10-15 NOTE — ED Notes (Signed)
Gave pt ginger ale.  

## 2015-10-18 LAB — GC/CHLAMYDIA PROBE AMP (~~LOC~~) NOT AT ARMC
Chlamydia: NEGATIVE
Neisseria Gonorrhea: NEGATIVE

## 2015-10-20 ENCOUNTER — Telehealth: Payer: Self-pay | Admitting: Neurology

## 2015-10-20 NOTE — Telephone Encounter (Signed)
Patient called to advise she went to hospital last Friday with severe stomach pain on Left side, CT scan done at hospital revealed large cyst on R ovary, surgery is scheduled on the 29th and patient wanted Dr. Leonie Man to be aware.

## 2015-10-21 NOTE — Telephone Encounter (Signed)
thanks

## 2015-10-28 ENCOUNTER — Ambulatory Visit (INDEPENDENT_AMBULATORY_CARE_PROVIDER_SITE_OTHER): Payer: 59 | Admitting: Obstetrics & Gynecology

## 2015-10-28 VITALS — BP 118/74 | HR 77 | Temp 97.9°F | Resp 17 | Ht 68.0 in | Wt 257.5 lb

## 2015-10-28 DIAGNOSIS — D271 Benign neoplasm of left ovary: Secondary | ICD-10-CM | POA: Diagnosis not present

## 2015-10-28 DIAGNOSIS — D259 Leiomyoma of uterus, unspecified: Secondary | ICD-10-CM | POA: Diagnosis not present

## 2015-10-28 DIAGNOSIS — E669 Obesity, unspecified: Secondary | ICD-10-CM

## 2015-10-28 NOTE — Progress Notes (Signed)
Subjective:     Alison Ward is a 54 y.o. D AA from [place] and the [place] female here  To discuss CT/ u/s findings of a 12 cm right dermoid cyst along with a fibroid uterus. She was seen in the ER on 10-15-15 with pain when this was diagnosed. She denies any post menopausal bleeding   Gynecologic History Patient's last menstrual period was 11/24/2011.  Last Pap: 2014 Results were: normal Last mammogram: 2015. Results were: normal  Obstetric History OB History  No data available     The following portions of the patient's history were reviewed and updated as appropriate: allergies, current medications, past family history, past medical history, past social history, past surgical history and problem list.  Review of Systems Pertinent items are noted in HPI. She has been abstinent for 5 years. She is currently unemployed.   Objective:    WNWHBFNAD Breathing, conversing, and ambulating normally    Abd- benign, obese  Assessment:      Plan:  Dermoid- plan for BSO, We discussed the option of removing her fibroid uterus at the same time, understanding increased op time, risk. She does want to go ahead remove uterus as well. She understands the risks of surgery, including, but not to infection, bleeding, DVTs, damage to bowel, bladder, ureters. She wishes to proceed.

## 2015-11-04 ENCOUNTER — Ambulatory Visit (INDEPENDENT_AMBULATORY_CARE_PROVIDER_SITE_OTHER): Payer: BLUE CROSS/BLUE SHIELD | Admitting: Neurology

## 2015-11-04 ENCOUNTER — Encounter: Payer: Self-pay | Admitting: Neurology

## 2015-11-04 ENCOUNTER — Encounter (HOSPITAL_COMMUNITY): Payer: Self-pay | Admitting: *Deleted

## 2015-11-04 VITALS — BP 113/80 | HR 78 | Ht 68.0 in | Wt 259.2 lb

## 2015-11-04 DIAGNOSIS — G40109 Localization-related (focal) (partial) symptomatic epilepsy and epileptic syndromes with simple partial seizures, not intractable, without status epilepticus: Secondary | ICD-10-CM | POA: Diagnosis not present

## 2015-11-04 MED ORDER — LEVETIRACETAM ER 500 MG PO TB24: 500.0000 mg | ORAL_TABLET | Freq: Every day | ORAL | Status: DC

## 2015-11-04 NOTE — Patient Instructions (Addendum)
I had a long discussion with the patient with regards to her left hyperesthesias as well as headaches which appears improved on current dose of Neurontin. We also discussed her brief episodes of altered consciousness being possibly complex partial seizures. I recommend a trial of Keppra XR 500 milligrams daily. She was advised to keep her appointment for her hysterectomy for ovarian teratoma. Return for follow-up in 3 months or call earlier if necessary.

## 2015-11-04 NOTE — Progress Notes (Signed)
[facility] [address] 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 She states she was sitting on a stool when she fainted and fell off and hit the back of her head. She did not lose consciousness for long and did not have any focal symptoms. EMS were called but Alison Ward chose not to go to the hospital and seek any help. Since then she's been having headaches which were initially mild and responded to Tylenol and ibuprofen but for the last couple of months the headaches are now occurring on a daily basis. She describes the headaches as variable from mild dull headaches to severe throbbing headaches which vary in severity from 2/10-7/10.Headache is not accompanied by nausea, vomiting or photophobia but she is bothered by loud sounds. Headaches do increase with physical activity and stress and are relieved by sleep and rest. She has been taking ibuprofen 800 mg once or twice a day with only partial relief. She has recently been given a prescription of Tylenol with Codeine but she has not started taking it yet. Alison Ward denies any visual symptoms or focal neurological symptoms accompanying her headaches. She has not had any recent brain imaging study. She denies any neck pain and raducular pain but does complain of mild tightness of the neck and shoulder muscles. 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 She required ventriculostomy at that time. She did recover from that quite well but did  have headaches which lasted for 6 months to year and then gradually  the headaches disappeared and she did quite well for nearly a year without any headaches. She was tried on a variety of headache medications at that time including Depakote, Topamax, amitriptyline but these did not work or she had side effects. Tizanidine probably helped her the most. She also has a new complaint now of left leg numbness which she describes as tingling sensation involving the anterior and lateral   aspect of her left thigh. This also began since her fall. She has seen her primary physician who has prescribed gabapentin which she takes 300mg  twice daily but it makes her dizzy and does not particularly help her numbness. She denies any severe back pain or radicular pain or trouble with walking, balance or bladder control. She has not had any back x-rays or MRI. Update 11/04/2015 : She returns for follow-up after last visit 2 months ago. She had noticed improvement in her headaches as well as left thigh paresthesias though she still has numbness is she does not get burning sensation as often. She is on Neurontin 300mg  three times daily. She is not able to afford Lyrica due to cost. She had MRI scan lumbar spine done on 09/29/15 which I personally reviewed showed mild spinal stenosis at L4-5 with without definite pinched nerve. Incidental cystic lesion was noted on the right side in the pelvis. 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 Alison Ward was  seen in the emergency room on that day with severe abdominal pain. She was seen by gynecologist and has abdominal hysterectomy and bilateral oophorectomy planned for February 2017. She also had episode of near-syncope on 10/22/2015. Her grandson was with her and called for help as apparently the Alison Ward was unresponsive to him and did not answer questions. Inquiry she admits to 3 somewhat  similar episodes over the last 4 months since October. She feels like she is about to pass out but most of the time she consented down. On one occasion she fell on the floor. She may briefly lose consciousness but does not appear to be confused and dazed. She had EEG done on 10/05/15 which I have reviewed was normal. She has no documented history of seizures. ROS:   14 system review of systems is positive for  chills, fever, abdominal pain, memory loss, dizziness, headache, numbness, passing out and all other systems negative PMH:  Past Medical History  Diagnosis Date  . Hypertension   . Heart murmur   . Stroke ([place]) 07/05/2013  . Chronic headaches     daily, bitemporal, associated with tingling in face    Social History:  Social History   Social History  . Marital Status: Divorced    Spouse Name: N/A  . Number of Children: 3  . Years of Education: college   Occupational History  . [employer]    Social History Main Topics  . Smoking status: Never Smoker   . Smokeless tobacco: Never Used  . Alcohol Use: No  . Drug Use: No  . Sexual Activity: Not on file   Other Topics Concern  . Not on file   Social History Narrative    Medications:   Current Outpatient Prescriptions on File Prior to Visit  Medication Sig Dispense Refill  . acetaminophen (TYLENOL) 500 MG tablet Take 1,000 mg by mouth every 6 (six) hours as needed for moderate pain.    Marland Kitchen aspirin EC 81 MG tablet Take 1 tablet (81 mg total) by mouth daily. 30 tablet 1  . fluticasone (FLONASE) 50 MCG/ACT nasal spray Place 2 sprays into both nostrils daily. 16 g 1  . gabapentin (NEURONTIN) 300 MG capsule Take 300 mg by mouth 3 (three) times daily.    Marland Kitchen lisinopril-hydrochlorothiazide (PRINZIDE,ZESTORETIC) 20-25 MG per tablet Take 1 tablet by mouth daily. 90 tablet 1  . oxyCODONE-acetaminophen (PERCOCET/ROXICET) 5-325 MG tablet Take 2 tablets by mouth every 4 (four) hours as needed for severe pain. 35 tablet 0   No  current facility-administered medications on file prior to visit.    Allergies:   Allergies  Allergen Reactions  . Elavil [Amitriptyline] Other (See Comments)    DIZZINESS    Physical Exam General: Obese middle-aged African-American lady, seated, in no evident distress Head: head normocephalic and atraumatic.   Neck: supple with no carotid or supraclavicular bruits Cardiovascular: regular rate and rhythm, no murmurs Musculoskeletal: no deformity Skin:  no rash/petichiae Vascular:  Normal pulses all extremities  Neurologic Exam Mental Status: Awake and fully alert. Oriented to place and time. Recent and remote memory intact. Attention span, concentration and fund of knowledge appropriate. Mood and affect appropriate. Recall 3/3. Animal naming test 9. Cranial Nerves: Fundoscopic exam not done  . Pupils equal, briskly reactive to light. Extraocular movements full without nystagmus. Visual fields full to confrontation. Hearing intact. Facial sensation intact. Face, tongue, palate moves normally and symmetrically.  Motor: Normal bulk and tone. Normal strength in all tested extremity muscles.  Diminished fine finger movements on the left. Orbits right over left approximately. Minimum action tremor of the left outstretched approximately. Absent tremor at rest. Sensory.: intact  position and vibratory sensation. Subjective decrease touch and pinprick sensation in the left thigh on the anterior lateral aspect of the knee. Coordination: Rapid alternating movements normal in all extremities. Finger-to-nose and heel-to-shin performed accurately bilaterally. Gait and Station: Arises from chair without difficulty. Stance is normal. Gait demonstrates normal stride length and balance . Able to heel, toe and tandem walk without difficulty.  Reflexes: 1+ and symmetric. Toes downgoing.       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 Remote history of hypertensive right temporal intracerebral hemorrhage in 2014. 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 We also discussed her brief episodes of altered consciousness being possibly complex partial seizures. I recommend a trial of Keppra XR 500 milligrams daily. She was advised to keep her appointment for her hysterectomy for ovarian teratoma. Return for follow-up in 3 months or call earlier if necessary.  Antony Contras, MD  Note: This document was prepared with digital dictation and possible smart phrase technology. Any transcriptional errors that result from this process are unintentional.

## 2015-11-10 ENCOUNTER — Telehealth: Payer: Self-pay | Admitting: Neurology

## 2015-11-10 NOTE — Telephone Encounter (Signed)
Patient is calling to get a written Rx for oxyCODONE-acetaminophen (PERCOCET/ROXICET) 5-325 MG tablet. I advised the Rx will be ready in 24 hours unless otherwise advised by the nurse.

## 2015-11-10 NOTE — Telephone Encounter (Signed)
Patient is requesting a Rx for Percocet.  It does not appear we have prescribed this before.  I called patient back, got no answer.   Dr Leonie Man, is this something you'd like to prescribe?  Thank you.

## 2015-11-11 NOTE — Telephone Encounter (Signed)
no

## 2015-11-11 NOTE — Telephone Encounter (Signed)
I called the patient back again.  Relayed providers message.  She expressed understanding.

## 2015-11-12 ENCOUNTER — Telehealth: Payer: Self-pay | Admitting: Internal Medicine

## 2015-11-12 NOTE — Telephone Encounter (Signed)
Pt. Called requesting a med refill on gabapentin (NEURONTIN) 300 MG capsule. Please f/u with pt. °

## 2015-11-15 ENCOUNTER — Other Ambulatory Visit: Payer: Self-pay | Admitting: *Deleted

## 2015-11-15 NOTE — Telephone Encounter (Signed)
Review of her chart indicates she was placed on Lyrica by neurology and she should remain on this until her next visit with neurology. I will not be refilling gabapentin because she cannot be on both.

## 2015-11-15 NOTE — Telephone Encounter (Signed)
Thank you for that note. I will contact the patient and inform her.

## 2015-11-15 NOTE — Telephone Encounter (Signed)
Patient last received Gabapentin in August of 2016. Patient was advised to stop Gabapentin at Discharge. Patient is now requesting a refill

## 2015-11-15 NOTE — Telephone Encounter (Signed)
Medical Assistant left message on patient's home and cell voicemail. Voicemail states to give a call back to [place] with [facility] at [phone].  Please inform patient of Gabapentin not being refilled because she is on Lyrica.  Patient can not be on both medicaitons, therefore she should remain on Lyrica until her next neurology appointment.

## 2015-11-15 NOTE — Telephone Encounter (Signed)
Pt. Called returning nurse call, please call patient back

## 2015-11-16 ENCOUNTER — Telehealth: Payer: Self-pay | Admitting: Internal Medicine

## 2015-11-16 NOTE — Telephone Encounter (Signed)
Medical Assistant left message on patient's home and cell voicemail. Voicemail states to give a call back to [place] with [facility] at [phone].  Please inform patient of gabapentin not being refilled due to patient being on Lyrica. Patient may not be on both.

## 2015-11-16 NOTE — Telephone Encounter (Signed)
She needs to speak with her neurologist so he is aware as she was placed on this for a neurological condition

## 2015-11-16 NOTE — Telephone Encounter (Signed)
Patient verified DOB Patient states she informed the neurologist of the Lyrica being too expensive. Patient states neurologist stated she was fine with using the Gabapentin TID. Patient has a refill on the medication and when she returns to neuro she will ask for the prescription for TID. Patient had no other questions or concerns.

## 2015-11-16 NOTE — Telephone Encounter (Signed)
Patient called stating that she cannot afford the Lyrica and therefore would like the Gabapentin. Patient states she is completely out of medicine. Please follow up with patient.

## 2015-12-08 ENCOUNTER — Encounter (HOSPITAL_COMMUNITY): Payer: Self-pay

## 2015-12-08 ENCOUNTER — Encounter (HOSPITAL_COMMUNITY)
Admission: RE | Admit: 2015-12-08 | Discharge: 2015-12-08 | Disposition: A | Discharge status: Home / Self Care | Payer: BLUE CROSS/BLUE SHIELD | Source: Ambulatory Visit | Attending: Obstetrics & Gynecology | Admitting: Obstetrics & Gynecology

## 2015-12-08 DIAGNOSIS — Z01812 Encounter for preprocedural laboratory examination: Secondary | ICD-10-CM | POA: Diagnosis not present

## 2015-12-08 HISTORY — DX: Unspecified convulsions: R56.9

## 2015-12-08 LAB — TYPE AND SCREEN
ABO/RH(D): A POS
ANTIBODY SCREEN: NEGATIVE

## 2015-12-08 LAB — CBC
HCT: 37 % (ref 36.0–46.0)
Hemoglobin: 12 g/dL (ref 12.0–15.0)
MCH: 28.1 pg (ref 26.0–34.0)
MCHC: 32.4 g/dL (ref 30.0–36.0)
MCV: 86.7 fL (ref 78.0–100.0)
PLATELETS: 279 10*3/uL (ref 150–400)
RBC: 4.27 MIL/uL (ref 3.87–5.11)
RDW: 16.1 % — ABNORMAL HIGH (ref 11.5–15.5)
WBC: 4 10*3/uL (ref 4.0–10.5)

## 2015-12-08 LAB — BASIC METABOLIC PANEL
Anion gap: 7 (ref 5–15)
BUN: 19 mg/dL (ref 6–20)
CALCIUM: 9.5 mg/dL (ref 8.9–10.3)
CHLORIDE: 101 mmol/L (ref 101–111)
CO2: 30 mmol/L (ref 22–32)
CREATININE: 1.03 mg/dL — AB (ref 0.44–1.00)
GFR calc non Af Amer: 59 mL/min — ABNORMAL LOW (ref >60)
Glucose, Bld: 92 mg/dL (ref 65–99)
Potassium: 3.3 mmol/L — ABNORMAL LOW (ref 3.5–5.1)
SODIUM: 138 mmol/L (ref 135–145)

## 2015-12-08 LAB — ABO/RH: ABO/RH(D): A POS

## 2015-12-08 NOTE — Patient Instructions (Signed)
Your procedure is scheduled on: December 15, 2015   Enter through the Main Entrance of [facility] at: 7:00 am   Pick up the phone at the desk and dial [phone].  Call this number if you have problems the morning of surgery: [phone].  Remember: Do NOT eat food: after midnight on Tuesday  Do NOT drink clear liquids after: midnight on Tuesday  Take these medicines the morning of surgery with a SIP OF WATER: Gabapentin and Lisinopril HCTZ    Do NOT wear jewelry (body piercing), metal hair clips/bobby pins, make-up, or nail polish. Do NOT wear lotions, powders, or perfumes.  You may wear deoderant. Do NOT shave for 48 hours prior to surgery. Do NOT bring valuables to the hospital. Contacts, dentures, or bridgework may not be worn into surgery. Leave suitcase in car.  After surgery it may be brought to your room.  For patients admitted to the hospital, checkout time is 11:00 AM the day of discharge.

## 2015-12-14 MED ORDER — CEFAZOLIN SODIUM-DEXTROSE 2-3 GM-% IV SOLR
2.0000 g | INTRAVENOUS | Status: AC
  Administered 2015-12-15: 2 g via INTRAVENOUS

## 2015-12-14 NOTE — Anesthesia Preprocedure Evaluation (Addendum)
Anesthesia Evaluation  Patient identified by MRN, date of birth, ID band Patient awake    Reviewed: Allergy & Precautions, H&P , NPO status , Patient's Chart, lab work & pertinent test results  Airway Mallampati: II  TM Distance: >3 FB Neck ROM: Full    Dental no notable dental hx.    Pulmonary neg pulmonary ROS,    Pulmonary exam normal breath sounds clear to auscultation       Cardiovascular hypertension, Pt. on medications Normal cardiovascular exam+ Valvular Problems/Murmurs  Rhythm:Regular Rate:Normal     Neuro/Psych  Headaches, Seizures -,  Ventricle hemorrhage, altered mental status. CVA negative psych ROS   GI/Hepatic negative GI ROS, Neg liver ROS,   Endo/Other  negative endocrine ROSMorbid obesity  Renal/GU Renal diseasenegative Renal ROS     Musculoskeletal negative musculoskeletal ROS (+)   Abdominal   Peds negative pediatric ROS (+)  Hematology negative hematology ROS (+)   Anesthesia Other Findings   Reproductive/Obstetrics negative OB ROS                            Anesthesia Physical  Anesthesia Plan  ASA: III  Anesthesia Plan: General   Post-op Pain Management:    Induction: Intravenous  Airway Management Planned: Oral ETT  Additional Equipment:   Intra-op Plan:   Post-operative Plan: Extubation in OR  Informed Consent: I have reviewed the patients History and Physical, chart, labs and discussed the procedure including the risks, benefits and alternatives for the proposed anesthesia with the patient or authorized representative who has indicated his/her understanding and acceptance.   Dental advisory given  Plan Discussed with: CRNA  Anesthesia Plan Comments:         Anesthesia Quick Evaluation

## 2015-12-15 ENCOUNTER — Inpatient Hospital Stay (HOSPITAL_COMMUNITY): Payer: BLUE CROSS/BLUE SHIELD | Admitting: Anesthesiology

## 2015-12-15 ENCOUNTER — Encounter (HOSPITAL_COMMUNITY): Payer: Self-pay | Admitting: Certified Registered Nurse Anesthetist

## 2015-12-15 ENCOUNTER — Encounter (HOSPITAL_COMMUNITY)
Admission: RE | Disposition: A | Discharge status: Home / Self Care | Payer: Self-pay | Source: Ambulatory Visit | Attending: Obstetrics & Gynecology

## 2015-12-15 ENCOUNTER — Inpatient Hospital Stay (HOSPITAL_COMMUNITY)
Admission: RE | Admit: 2015-12-15 | Discharge: 2015-12-17 | DRG: 743 | Disposition: A | Discharge status: Home / Self Care | Payer: BLUE CROSS/BLUE SHIELD | Source: Ambulatory Visit | Attending: Obstetrics & Gynecology | Admitting: Obstetrics & Gynecology

## 2015-12-15 DIAGNOSIS — Z9889 Other specified postprocedural states: Secondary | ICD-10-CM

## 2015-12-15 DIAGNOSIS — D270 Benign neoplasm of right ovary: Secondary | ICD-10-CM | POA: Diagnosis present

## 2015-12-15 DIAGNOSIS — I10 Essential (primary) hypertension: Secondary | ICD-10-CM | POA: Diagnosis present

## 2015-12-15 DIAGNOSIS — Z8673 Personal history of transient ischemic attack (TIA), and cerebral infarction without residual deficits: Secondary | ICD-10-CM

## 2015-12-15 DIAGNOSIS — D259 Leiomyoma of uterus, unspecified: Secondary | ICD-10-CM | POA: Diagnosis present

## 2015-12-15 DIAGNOSIS — Z6839 Body mass index (BMI) 39.0-39.9, adult: Secondary | ICD-10-CM

## 2015-12-15 DIAGNOSIS — R51 Headache: Secondary | ICD-10-CM | POA: Diagnosis present

## 2015-12-15 HISTORY — PX: ABDOMINAL HYSTERECTOMY: SHX81

## 2015-12-15 HISTORY — PX: SALPINGOOPHORECTOMY: SHX82

## 2015-12-15 LAB — PREGNANCY, URINE: PREG TEST UR: NEGATIVE

## 2015-12-15 SURGERY — HYSTERECTOMY, ABDOMINAL
Anesthesia: General | Site: Abdomen

## 2015-12-15 MED ORDER — EPHEDRINE 5 MG/ML INJ
INTRAVENOUS | Status: AC
  Filled 2015-12-15: qty 10

## 2015-12-15 MED ORDER — KETOROLAC TROMETHAMINE 30 MG/ML IJ SOLN
INTRAMUSCULAR | Status: AC
  Filled 2015-12-15: qty 1

## 2015-12-15 MED ORDER — EPHEDRINE SULFATE 50 MG/ML IJ SOLN
INTRAMUSCULAR | Status: DC | PRN
  Administered 2015-12-15: 10 mg via INTRAVENOUS

## 2015-12-15 MED ORDER — LACTATED RINGERS IV SOLN
INTRAVENOUS | Status: DC
  Administered 2015-12-15 – 2015-12-16 (×2): via INTRAVENOUS

## 2015-12-15 MED ORDER — SCOPOLAMINE 1 MG/3DAYS TD PT72
1.0000 | MEDICATED_PATCH | Freq: Once | TRANSDERMAL | Status: DC
  Administered 2015-12-15: 1.5 mg via TRANSDERMAL

## 2015-12-15 MED ORDER — ROCURONIUM BROMIDE 100 MG/10ML IV SOLN
INTRAVENOUS | Status: DC | PRN
  Administered 2015-12-15: 10 mg via INTRAVENOUS
  Administered 2015-12-15: 40 mg via INTRAVENOUS

## 2015-12-15 MED ORDER — MEPERIDINE HCL 25 MG/ML IJ SOLN: 6.2500 mg | INTRAMUSCULAR | Status: DC | PRN

## 2015-12-15 MED ORDER — FENTANYL CITRATE (PF) 100 MCG/2ML IJ SOLN
INTRAMUSCULAR | Status: AC
  Administered 2015-12-15: 100 ug via INTRAVENOUS
  Filled 2015-12-15: qty 2

## 2015-12-15 MED ORDER — LIDOCAINE HCL (PF) 1 % IJ SOLN
INTRAMUSCULAR | Status: AC
  Filled 2015-12-15: qty 5

## 2015-12-15 MED ORDER — ONDANSETRON HCL 4 MG/2ML IJ SOLN
INTRAMUSCULAR | Status: DC | PRN
  Administered 2015-12-15: 4 mg via INTRAVENOUS

## 2015-12-15 MED ORDER — NEOSTIGMINE METHYLSULFATE 10 MG/10ML IV SOLN
INTRAVENOUS | Status: DC | PRN
  Administered 2015-12-15: 3 mg via INTRAVENOUS

## 2015-12-15 MED ORDER — PROPOFOL 10 MG/ML IV BOLUS
INTRAVENOUS | Status: DC | PRN
  Administered 2015-12-15: 120 mg via INTRAVENOUS

## 2015-12-15 MED ORDER — MIDAZOLAM HCL 2 MG/2ML IJ SOLN
INTRAMUSCULAR | Status: AC
  Filled 2015-12-15: qty 2

## 2015-12-15 MED ORDER — HYDROCHLOROTHIAZIDE 25 MG PO TABS
25.0000 mg | ORAL_TABLET | Freq: Every day | ORAL | Status: DC
  Administered 2015-12-16 – 2015-12-17 (×2): 25 mg via ORAL
  Filled 2015-12-15 (×2): qty 1

## 2015-12-15 MED ORDER — FENTANYL CITRATE (PF) 250 MCG/5ML IJ SOLN
INTRAMUSCULAR | Status: AC
  Filled 2015-12-15: qty 5

## 2015-12-15 MED ORDER — DEXAMETHASONE SODIUM PHOSPHATE 4 MG/ML IJ SOLN
INTRAMUSCULAR | Status: AC
  Filled 2015-12-15: qty 1

## 2015-12-15 MED ORDER — GLYCOPYRROLATE 0.2 MG/ML IJ SOLN
INTRAMUSCULAR | Status: DC | PRN
  Administered 2015-12-15: 0.6 mg via INTRAVENOUS

## 2015-12-15 MED ORDER — PROPOFOL 10 MG/ML IV BOLUS
INTRAVENOUS | Status: AC
  Filled 2015-12-15: qty 20

## 2015-12-15 MED ORDER — GLYCOPYRROLATE 0.2 MG/ML IJ SOLN
INTRAMUSCULAR | Status: AC
  Filled 2015-12-15: qty 3

## 2015-12-15 MED ORDER — SCOPOLAMINE 1 MG/3DAYS TD PT72
MEDICATED_PATCH | TRANSDERMAL | Status: AC
  Administered 2015-12-15: 1.5 mg via TRANSDERMAL
  Filled 2015-12-15: qty 1

## 2015-12-15 MED ORDER — KETOROLAC TROMETHAMINE 30 MG/ML IJ SOLN
INTRAMUSCULAR | Status: DC | PRN
  Administered 2015-12-15: 30 mg via INTRAVENOUS

## 2015-12-15 MED ORDER — HYDROMORPHONE HCL 1 MG/ML IJ SOLN
0.2000 mg | INTRAMUSCULAR | Status: DC | PRN
  Administered 2015-12-15: 0.6 mg via INTRAVENOUS
  Filled 2015-12-15: qty 1

## 2015-12-15 MED ORDER — DEXAMETHASONE SODIUM PHOSPHATE 10 MG/ML IJ SOLN
INTRAMUSCULAR | Status: DC | PRN
  Administered 2015-12-15: 4 mg via INTRAVENOUS

## 2015-12-15 MED ORDER — FENTANYL CITRATE (PF) 100 MCG/2ML IJ SOLN
INTRAMUSCULAR | Status: DC | PRN
  Administered 2015-12-15 (×2): 50 ug via INTRAVENOUS
  Administered 2015-12-15: 100 ug via INTRAVENOUS
  Administered 2015-12-15: 50 ug via INTRAVENOUS

## 2015-12-15 MED ORDER — SENNA 8.6 MG PO TABS
1.0000 | ORAL_TABLET | ORAL | Status: DC | PRN
  Administered 2015-12-16: 8.6 mg via ORAL
  Filled 2015-12-15 (×2): qty 1

## 2015-12-15 MED ORDER — LIDOCAINE HCL (CARDIAC) 20 MG/ML IV SOLN
INTRAVENOUS | Status: DC | PRN
  Administered 2015-12-15: 50 mg via INTRAVENOUS

## 2015-12-15 MED ORDER — LISINOPRIL 20 MG PO TABS
20.0000 mg | ORAL_TABLET | Freq: Every day | ORAL | Status: DC
  Administered 2015-12-16 – 2015-12-17 (×2): 20 mg via ORAL
  Filled 2015-12-15 (×3): qty 1

## 2015-12-15 MED ORDER — BUPIVACAINE HCL (PF) 0.5 % IJ SOLN
INTRAMUSCULAR | Status: DC | PRN
  Administered 2015-12-15: 30 mL

## 2015-12-15 MED ORDER — LISINOPRIL-HYDROCHLOROTHIAZIDE 20-25 MG PO TABS: 1.0000 | ORAL_TABLET | Freq: Every day | ORAL | Status: DC

## 2015-12-15 MED ORDER — PROMETHAZINE HCL 25 MG/ML IJ SOLN: 6.2500 mg | INTRAMUSCULAR | Status: DC | PRN

## 2015-12-15 MED ORDER — ONDANSETRON HCL 4 MG/2ML IJ SOLN
INTRAMUSCULAR | Status: AC
  Filled 2015-12-15: qty 2

## 2015-12-15 MED ORDER — MIDAZOLAM HCL 2 MG/2ML IJ SOLN
INTRAMUSCULAR | Status: DC | PRN
  Administered 2015-12-15: 2 mg via INTRAVENOUS

## 2015-12-15 MED ORDER — GABAPENTIN 300 MG PO CAPS
300.0000 mg | ORAL_CAPSULE | Freq: Three times a day (TID) | ORAL | Status: DC
  Administered 2015-12-15 – 2015-12-17 (×6): 300 mg via ORAL
  Filled 2015-12-15 (×9): qty 1

## 2015-12-15 MED ORDER — CEFAZOLIN SODIUM-DEXTROSE 2-3 GM-% IV SOLR
INTRAVENOUS | Status: AC
  Filled 2015-12-15: qty 50

## 2015-12-15 MED ORDER — LEVETIRACETAM ER 500 MG PO TB24
500.0000 mg | ORAL_TABLET | Freq: Every day | ORAL | Status: DC
  Administered 2015-12-15 – 2015-12-16 (×2): 500 mg via ORAL
  Filled 2015-12-15 (×3): qty 1

## 2015-12-15 MED ORDER — ONDANSETRON HCL 4 MG/2ML IJ SOLN: 4.0000 mg | Freq: Four times a day (QID) | INTRAMUSCULAR | Status: DC | PRN

## 2015-12-15 MED ORDER — LACTATED RINGERS IV SOLN
INTRAVENOUS | Status: DC
  Administered 2015-12-15 (×3): via INTRAVENOUS

## 2015-12-15 MED ORDER — ONDANSETRON HCL 4 MG PO TABS: 4.0000 mg | ORAL_TABLET | Freq: Four times a day (QID) | ORAL | Status: DC | PRN

## 2015-12-15 MED ORDER — HYDROMORPHONE HCL 1 MG/ML IJ SOLN: 0.2500 mg | INTRAMUSCULAR | Status: DC | PRN

## 2015-12-15 MED ORDER — FENTANYL CITRATE (PF) 100 MCG/2ML IJ SOLN
100.0000 ug | Freq: Once | INTRAMUSCULAR | Status: AC
  Administered 2015-12-15: 100 ug via INTRAVENOUS

## 2015-12-15 MED ORDER — OXYCODONE-ACETAMINOPHEN 5-325 MG PO TABS
1.0000 | ORAL_TABLET | ORAL | Status: DC | PRN
  Administered 2015-12-15: 2 via ORAL
  Administered 2015-12-16 (×2): 1 via ORAL
  Filled 2015-12-15 (×2): qty 1
  Filled 2015-12-15: qty 2

## 2015-12-15 MED ORDER — IBUPROFEN 800 MG PO TABS
800.0000 mg | ORAL_TABLET | Freq: Three times a day (TID) | ORAL | Status: DC | PRN
  Administered 2015-12-16 (×2): 800 mg via ORAL
  Filled 2015-12-15 (×2): qty 1

## 2015-12-15 SURGICAL SUPPLY — 33 items
APL SKNCLS STERI-STRIP NONHPOA (GAUZE/BANDAGES/DRESSINGS) ×3
BENZOIN TINCTURE PRP APPL 2/3 (GAUZE/BANDAGES/DRESSINGS) ×2 IMPLANT
CANISTER SUCT 3000ML (MISCELLANEOUS) ×4 IMPLANT
CLOTH BEACON ORANGE TIMEOUT ST (SAFETY) ×4 IMPLANT
CONT PATH 16OZ SNAP LID 3702 (MISCELLANEOUS) ×4 IMPLANT
DECANTER SPIKE VIAL GLASS SM (MISCELLANEOUS) ×2 IMPLANT
DRAPE CESAREAN BIRTH W POUCH (DRAPES) ×4 IMPLANT
DRAPE WARM FLUID 44X44 (DRAPE) ×2 IMPLANT
DRSG OPSITE POSTOP 4X10 (GAUZE/BANDAGES/DRESSINGS) ×4 IMPLANT
DURAPREP 26ML APPLICATOR (WOUND CARE) ×4 IMPLANT
GAUZE SPONGE 4X4 16PLY XRAY LF (GAUZE/BANDAGES/DRESSINGS) ×2 IMPLANT
GLOVE BIO SURGEON STRL SZ 6.5 (GLOVE) ×4 IMPLANT
GLOVE BIOGEL PI IND STRL 7.0 (GLOVE) ×9 IMPLANT
GLOVE BIOGEL PI INDICATOR 7.0 (GLOVE) ×3
GOWN STRL REUS W/TWL LRG LVL3 (GOWN DISPOSABLE) ×12 IMPLANT
NDL SPNL 18GX3.5 QUINCKE PK (NEEDLE) ×2 IMPLANT
NEEDLE SPNL 18GX3.5 QUINCKE PK (NEEDLE) ×4 IMPLANT
NS IRRIG 1000ML POUR BTL (IV SOLUTION) ×4 IMPLANT
PACK ABDOMINAL GYN (CUSTOM PROCEDURE TRAY) ×4 IMPLANT
PAD OB MATERNITY 4.3X12.25 (PERSONAL CARE ITEMS) ×4 IMPLANT
PENCIL SMOKE EVAC W/HOLSTER (ELECTROSURGICAL) ×4 IMPLANT
SPONGE LAP 18X18 X RAY DECT (DISPOSABLE) ×8 IMPLANT
STRIP CLOSURE SKIN 1/2X4 (GAUZE/BANDAGES/DRESSINGS) ×4 IMPLANT
SUT CHROMIC 3 0 SH 27 (SUTURE) IMPLANT
SUT PDS AB 0 CTX 60 (SUTURE) ×4 IMPLANT
SUT VIC AB 0 CT1 36 (SUTURE) ×4 IMPLANT
SUT VIC AB 2-0 CT1 18 (SUTURE) ×12 IMPLANT
SUT VIC AB 3-0 CT1 27 (SUTURE) ×4
SUT VIC AB 3-0 CT1 TAPERPNT 27 (SUTURE) ×3 IMPLANT
SYR 30ML LL (SYRINGE) ×4 IMPLANT
TOWEL OR 17X24 6PK STRL BLUE (TOWEL DISPOSABLE) ×8 IMPLANT
TRAY FOLEY CATH SILVER 14FR (SET/KITS/TRAYS/PACK) ×4 IMPLANT
WATER STERILE IRR 1000ML POUR (IV SOLUTION) ×4 IMPLANT

## 2015-12-15 NOTE — Op Note (Signed)
12/15/2015  9:55 AM  PATIENT:  Alison Ward  55 y.o. female  PRE-OPERATIVE DIAGNOSIS:  FIBROID TUMOR,RIGHT DERMOID CYST  POST-OPERATIVE DIAGNOSIS:  fibroid tumor, right dermoid cyst  PROCEDURE:  Procedure(s): HYSTERECTOMY ABDOMINAL (N/A) SALPINGO OOPHORECTOMY (Bilateral)  SURGEON:  Surgeon(s) and Role:    * Emily Filbert, MD - Primary   ASSISTANTS: none  Peggy Constant, MD  ANESTHESIA:   general  EBL:  Total I/O In: 1000 [I.V.:1000] Out: 450 [Urine:300; Blood:150]  BLOOD ADMINISTERED:none  DRAINS: none   LOCAL MEDICATIONS USED:  MARCAINE     SPECIMEN:  Source of Specimen:  uterus, tubes, ovaries (inclucing left dermoid)  DISPOSITION OF SPECIMEN:  PATHOLOGY  COUNTS:  YES  TOURNIQUET:  * No tourniquets in log *  DICTATION: .Dragon Dictation  PLAN OF CARE: Admit to inpatient   PATIENT DISPOSITION:  PACU - hemodynamically stable.   Delay start of Pharmacological VTE agent (>24hrs) due to surgical blood loss or risk of bleeding: not applicable     The risks, benefits, and alternatives of surgery were explained, understood, and accepted. Consents were signed. All questions were answered. She was taken to the operating room and general anesthesia was applied without complication. Her abdomen and vagina were prepped and draped in the usual sterile fashion. A Foley catheter was placed which drained clear urine throughout the case.  A transverse incision was made approximately 2 cm above her symphysis pubis. The incision was carried down through the subcutaneous tissue to the fascia. Bleeding encountered was cauterized with the Bovie. The fascia was scored the midline and the fascial incision was extended bilaterally. The pyramidalis muscles were separated in a transverse fashion using electrosurgical technique. Approximately 2 cm of the rectus muscles were separated in a transverse fashion in the midline using electrosurgical technique. Hemostasis was maintained. The  peritoneum was entered with hemostats and the peritoneal incision was extended bilaterally with the Bovie, taking care to avoid bowel and bladder. The patient was placed in Trendelenburg position. A very large dermoid filled her pelvis. There were many filmy adhesion from the small bowel to the dermoid. These were carefully taken down manually. I noted that the dermoid cyst pedicle was twisted at least 3 times. I untwisted it and placed 2 Kelly clamps across it. I removed the dermoid and oviducts intact. Her bowel was packed out of the abdominal cavity. The pelvis was inspected. Her uterus had several fibroids that were noted. She had a cervix of about 4 cm in length.  Coker clamps were used to elevate the uterus. The round ligaments were identified clamped cut and ligated. A bladder flap was created anteriorly and the bladder was pushed out of the operative site with a moist lap sponge. The IP ligament on the right was clamped, cut, and ligated. Excellent hemostasis was noted. 2-0 Vicryl sutures used throughout this case unless otherwise specified. The uterine vessels were skeletonized, clamped, cut, and doubly ligated. A bladder flap was created anteriorly. The remainder of the cervix was separated from its pelvic attachments using the same clamp, cut, ligate technique. Curved Heaney clamps were used to clamp beneath the cervix. The cervix and uterus were removed and sent to pathology. The peritoneum was oversewed over the hemostatic vaginal cuff. The pelvis was irrigated with 1 L of warm normal saline. All pedicles were noted to be hemostatic. I placed a piece of surgicell over the vaginal cuff. The ureters were noted to be functioning and of normal caliber. The sponges were removed from the  pelvis. The rectus muscles were inspected and hemostasis was assured. The fascia was closed with a 0 Vicryl running nonlocking suture. The subcutaneous tissue was irrigated, clean, dry.  A subcuticular closure was done with  3-0 vicryl suture. She tolerated the procedure well and was taken to the recovery room in stable condition. Her Foley catheter drained clear urine throughout.

## 2015-12-15 NOTE — Anesthesia Postprocedure Evaluation (Signed)
Anesthesia Post Note  Patient: Alison Ward  Procedure(s) Performed: Procedure(s) (LRB): HYSTERECTOMY ABDOMINAL (N/A) SALPINGO OOPHORECTOMY (Bilateral)  Patient location during evaluation: [facility] Anesthesia Type: General Level of consciousness: awake and alert and oriented Pain management: satisfactory to patient (pain score 2) Vital Signs Assessment: post-procedure vital signs reviewed and stable Respiratory status: spontaneous breathing, nonlabored ventilation, respiratory function stable and patient connected to nasal cannula oxygen Cardiovascular status: stable Postop Assessment: no headache, no backache, no signs of nausea or vomiting and adequate PO intake Anesthetic complications: no    Last Vitals:  Filed Vitals:   12/15/15 1150 12/15/15 1250  BP: 130/78 135/84  Pulse: 54 53  Temp: 36.5 C 36.7 C  Resp: 17 16    Last Pain:  Filed Vitals:   12/15/15 1253  PainSc: 4                  Husain Costabile

## 2015-12-15 NOTE — Addendum Note (Signed)
Addendum  created 12/15/15 1329 by Jonna Munro, CRNA   Modules edited: Clinical Notes   Clinical Notes:  File: [id number]

## 2015-12-15 NOTE — Anesthesia Postprocedure Evaluation (Signed)
Anesthesia Post Note  Patient: Alison Ward  Procedure(s) Performed: Procedure(s) (LRB): HYSTERECTOMY ABDOMINAL (N/A) SALPINGO OOPHORECTOMY (Bilateral)  Patient location during evaluation: PACU Anesthesia Type: General Level of consciousness: sedated Pain management: pain level controlled Vital Signs Assessment: post-procedure vital signs reviewed and stable Respiratory status: spontaneous breathing Cardiovascular status: stable Postop Assessment: no signs of nausea or vomiting Anesthetic complications: no    Last Vitals:  Filed Vitals:   12/15/15 1030 12/15/15 1045  BP: 92/71 110/64  Pulse: 50 49  Temp:    Resp: 16 17    Last Pain:  Filed Vitals:   12/15/15 1109  PainSc: [place]

## 2015-12-15 NOTE — Anesthesia Procedure Notes (Signed)
Procedure Name: Intubation Date/Time: 12/15/2015 8:39 AM Performed by: Bufford Spikes Pre-anesthesia Checklist: Patient identified, Timeout performed, Emergency Drugs available, Suction available and Patient being monitored Patient Re-evaluated:Patient Re-evaluated prior to inductionOxygen Delivery Method: Circle system utilized Preoxygenation: Pre-oxygenation with 100% oxygen Intubation Type: IV induction Ventilation: Mask ventilation without difficulty Laryngoscope Size: Miller and 2 Grade View: Grade II Tube type: Oral Tube size: 7.0 mm Number of attempts: 1 Airway Equipment and Method: Stylet Placement Confirmation: ETT inserted through vocal cords under direct vision,  positive ETCO2 and breath sounds checked- equal and bilateral Secured at: 21 cm Tube secured with: Tape Dental Injury: Teeth and Oropharynx as per pre-operative assessment

## 2015-12-15 NOTE — Transfer of Care (Signed)
Immediate Anesthesia Transfer of Care Note  Patient: JOHNESHIA ARK  Procedure(s) Performed: Procedure(s): HYSTERECTOMY ABDOMINAL (N/A) SALPINGO OOPHORECTOMY (Bilateral)  Patient Location: PACU  Anesthesia Type:General  Level of Consciousness: awake, alert  and oriented  Airway & Oxygen Therapy: Patient Spontanous Breathing and Patient connected to nasal cannula oxygen  Post-op Assessment: Report given to RN and Post -op Vital signs reviewed and stable  Post vital signs: Reviewed and stable  Last Vitals:  Filed Vitals:   12/15/15 0706  BP: 128/91  Pulse: 77  Temp: 36.8 C  Resp: 20    Complications: No apparent anesthesia complications

## 2015-12-15 NOTE — H&P (Signed)
Alison Ward is an 55 y.o.  D AA from [place] and the [place] female here for a TAH/BSO. She has a 12 cm right dermoid cyst along with a fibroid uterus. She was seen in the ER on 10-15-15 with pain when this was diagnosed. She denies any post menopausal bleeding.    Patient's last menstrual period was 11/24/2011.    Past Medical History  Diagnosis Date  . Hypertension   . Heart murmur   . Stroke ([facility]) 07/05/2013  . Chronic headaches     daily, bitemporal, associated with tingling in face  . 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 Ventriculostomy Left 07/07/2013    Procedure: VENTRICULOSTOMY;  Surgeon: Ophelia Charter, MD;  Location: [facility];  Service: Neurosurgery;  Laterality: Left;  Left Ventriculostomy and Removal of Right Vetriculostomy  . Ceserean section      Family History  Problem Relation Age of Onset  . Hypertension Mother   . Hypertension Father   . Diabetes Daughter   . Colon cancer Neg Hx     Social History:  reports that she has never smoked. She has never used smokeless tobacco. She reports that she does not drink alcohol or use illicit drugs.  Allergies:  Allergies  Allergen Reactions  . Elavil [Amitriptyline] Other (See Comments)    DIZZINESS    Prescriptions prior to admission  Medication Sig Dispense Refill Last Dose  . acetaminophen (TYLENOL) 500 MG tablet Take 1,000 mg by mouth daily as needed for mild pain, moderate pain or headache.    Taking  . gabapentin (NEURONTIN) 300 MG capsule Take 300 mg by mouth 3 (three) times daily.   12/15/2015 at 0630  . levETIRAcetam (KEPPRA XR) 500 MG 24 hr tablet Take 1 tablet (500 mg total) by mouth daily. 30 tablet 3 12/14/2015 at 2200  . lisinopril-hydrochlorothiazide (PRINZIDE,ZESTORETIC) 20-25 MG per tablet Take 1 tablet by mouth daily. 90 tablet 1 12/15/2015 at 0630  . sennosides (SENOKOT) 8.8 MG/5ML  syrup Take 15 mLs by mouth once a week. For mild contispation     . aspirin EC 81 MG tablet Take 1 tablet (81 mg total) by mouth daily. (Patient not taking: Reported on 11/26/2015) 30 tablet 1 Not Taking at Unknown time  . fluticasone (FLONASE) 50 MCG/ACT nasal spray Place 2 sprays into both nostrils daily. (Patient not taking: Reported on 11/26/2015) 16 g 1 Not Taking at Unknown time    ROS  Blood pressure 128/91, pulse 77, temperature 98.3 F (36.8 C), temperature source Oral, resp. rate 20, last menstrual period 11/24/2011, SpO2 98 %. Physical Exam  Heart- rrr Lungs- CTAB Abd- benign, obese  Results for orders placed or performed during the hospital encounter of 12/15/15 (from the past 24 hour(s))  Pregnancy, urine     Status: None   Collection Time: 12/15/15  7:00 AM  Result Value Ref Range   Preg Test, Ur NEGATIVE NEGATIVE    No results found.  Assessment/Plan: Dermoid cyst and fibroids - plan for TAH/BSO.  She understands the risks of surgery, including, but not to infection, bleeding, DVTs, damage to bowel, bladder, ureters. She wishes to proceed.     Tadd Holtmeyer C. 12/15/2015, 7:28 AM

## 2015-12-16 ENCOUNTER — Encounter (HOSPITAL_COMMUNITY): Payer: Self-pay | Admitting: Obstetrics & Gynecology

## 2015-12-16 LAB — CBC
HEMATOCRIT: 33.1 % — AB (ref 36.0–46.0)
Hemoglobin: 10.8 g/dL — ABNORMAL LOW (ref 12.0–15.0)
MCH: 28 pg (ref 26.0–34.0)
MCHC: 32.6 g/dL (ref 30.0–36.0)
MCV: 85.8 fL (ref 78.0–100.0)
PLATELETS: 267 10*3/uL (ref 150–400)
RBC: 3.86 MIL/uL — ABNORMAL LOW (ref 3.87–5.11)
RDW: 16.2 % — AB (ref 11.5–15.5)
WBC: 9.7 10*3/uL (ref 4.0–10.5)

## 2015-12-16 NOTE — Progress Notes (Signed)
1 Day Post-Op Procedure(s) (LRB): HYSTERECTOMY ABDOMINAL (N/A) SALPINGO OOPHORECTOMY (Bilateral)  Subjective: Patient reports tolerating PO and no problems voiding. She is ambulating well.   Objective: I have reviewed patient's vital signs, intake and output, medications and labs.  General: alert Resp: clear to auscultation bilaterally Cardio: regular rate and rhythm, S1, S2 normal, no murmur, click, rub or gallop GI: soft, non-tender; bowel sounds normal; no masses,  no organomegaly  Assessment: s/p Procedure(s): HYSTERECTOMY ABDOMINAL (N/A) SALPINGO OOPHORECTOMY (Bilateral): stable  Plan: Probable discharge home tomorrow   LOS: 1 day    Keary Waterson C. 12/16/2015, 8:25 AM

## 2015-12-17 DIAGNOSIS — D270 Benign neoplasm of right ovary: Principal | ICD-10-CM

## 2015-12-17 DIAGNOSIS — D259 Leiomyoma of uterus, unspecified: Secondary | ICD-10-CM

## 2015-12-17 MED ORDER — OXYCODONE-ACETAMINOPHEN 5-325 MG PO TABS: 1.0000 | ORAL_TABLET | ORAL | Status: DC | PRN

## 2015-12-17 MED ORDER — HYDROMORPHONE HCL 1 MG/ML IJ SOLN: 0.2000 mg | INTRAMUSCULAR | Status: DC | PRN

## 2015-12-17 NOTE — Discharge Summary (Signed)
Physician Discharge Summary  Patient ID: Alison Ward MRN: [id number] DOB/AGE: [birth year] 55 y.o.  Admit date: 12/15/2015 Discharge date: 12/17/2015  Admission Diagnoses: dermoid cyst, fibroids  Discharge Diagnoses: same Active Problems:   Post-operative state   Discharged Condition: good  Hospital Course: She underwent an uncomplicated TAH/BSO. Postoperatively she did very well, voiding, having flatus, tolerating po well, and ambulating without difficulty. She voiced her readiness to go home on POD #2  Consults: None  Significant Diagnostic Studies: labs: post op hemoglobin stable  Treatments: surgery: as above  Discharge Exam: Blood pressure 106/71, pulse 73, temperature 98.4 F (36.9 C), temperature source Oral, resp. rate 14, height 5\' 8"  (1.727 m), weight 116.121 kg (256 lb), last menstrual period 11/24/2011, SpO2 99 %. General appearance: alert Resp: clear to auscultation bilaterally Cardio: regular rate and rhythm, S1, S2 normal, no murmur, click, rub or gallop GI: soft, non-tender; bowel sounds normal; no masses,  no organomegaly Incision/Wound:c/d/i (honeycomb dressing removed prior to discharge  Disposition: 01-Home or Self Care     Medication List    STOP taking these medications        acetaminophen 500 MG tablet  Commonly known as:  TYLENOL      TAKE these medications        aspirin EC 81 MG tablet  Take 1 tablet (81 mg total) by mouth daily.     fluticasone 50 MCG/ACT nasal spray  Commonly known as:  FLONASE  Place 2 sprays into both nostrils daily.     gabapentin 300 MG capsule  Commonly known as:  NEURONTIN  Take 300 mg by mouth 3 (three) times daily.     HYDROmorphone 1 MG/ML injection  Commonly known as:  DILAUDID  Inject 0.2-0.6 mLs (0.2-0.6 mg total) into the vein every 2 (two) hours as needed for severe pain.     levETIRAcetam 500 MG 24 hr tablet  Commonly known as:  KEPPRA XR  Take 1 tablet (500 mg total) by mouth daily.     lisinopril-hydrochlorothiazide 20-25 MG tablet  Commonly known as:  PRINZIDE,ZESTORETIC  Take 1 tablet by mouth daily.     oxyCODONE-acetaminophen 5-325 MG tablet  Commonly known as:  PERCOCET/ROXICET  Take 1-2 tablets by mouth every 4 (four) hours as needed for severe pain (moderate to severe pain (when tolerating fluids)).     sennosides 8.8 MG/5ML syrup  Commonly known as:  SENOKOT  Take 15 mLs by mouth once a week. For mild contispation           Follow-up Information    Follow up with Aloysious Vangieson C., MD. Schedule an appointment as soon as possible for a visit in 6 weeks.   Specialty:  Obstetrics and Gynecology   Contact information:   [place] [address] [phone]       Signed: Emily Filbert 12/17/2015, 11:19 AM

## 2015-12-17 NOTE — Progress Notes (Signed)
Pt is discharged in the care of daughter with [employer]. Denies any pain or discomfort. Abdominal incision is clean and dry. Spirits are good. Understands all discharged instructions well Questions asked and answered. Stable.

## 2015-12-17 NOTE — Discharge Instructions (Signed)
Abdominal Hysterectomy, Care After °Refer to this sheet in the next few weeks. These instructions provide you with information on caring for yourself after your procedure. Your health care provider may also give you more specific instructions. Your treatment has been planned according to current medical practices, but problems sometimes occur. Call your health care provider if you have any problems or questions after your procedure.  °WHAT TO EXPECT AFTER THE PROCEDURE °After your procedure, it is typical to have the following: °· Pain. °· Feeling tired. °· Poor appetite. °· Less interest in sex. °It takes 4-6 weeks to recover from this surgery.  °HOME CARE INSTRUCTIONS  °· Take pain medicines only as directed by your health care provider. Do not take over-the-counter pain medicines without checking with your health care provider first.  °· Change your bandage as directed by your health care provider. °· Return to your health care provider to have your sutures taken out. °· Take showers instead of baths for 2-3 weeks. Ask your health care provider when it is safe to start showering.  °· Do not douche, use tampons, or have sexual intercourse for at least 6 weeks or until your health care provider says you can.   °· Follow your health care provider's advice about exercise, lifting, driving, and general activities. °· Get plenty of rest and sleep.   °· Do not lift anything heavier than a gallon of milk (about 10 lb [4.5 kg]) for the first month after surgery. °· You can resume your normal diet if your health care provider says it is okay.   °· Do not drink alcohol until your health care provider says you can.   °· If you are constipated, ask your health care provider if you can take a mild laxative. °· Eating foods high in fiber may also help with constipation. Eat plenty of raw fruits and vegetables, whole grains, and beans. °· Drink enough fluids to keep your urine clear or pale yellow.   °· Try to have someone at  home with you for the first 1-2 weeks to help around the house. °· Keep all follow-up appointments. °SEEK MEDICAL CARE IF:  °· You have chills or fever. °· You have swelling, redness, or pain in the area of your incision that is getting worse.   °· You have pus coming from the incision.   °· You notice a bad smell coming from the incision or bandage.   °· Your incision breaks open.   °· You feel dizzy or light-headed.   °· You have pain or bleeding when you urinate.   °· You have persistent diarrhea.   °· You have persistent nausea and vomiting.   °· You have abnormal vaginal discharge.   °· You have a rash.   °· You have any type of abnormal reaction or develop an allergy to your medicine.   °· Your pain medicine is not helping.   °SEEK IMMEDIATE MEDICAL CARE IF:  °· You have a fever and your symptoms suddenly get worse. °· You have severe abdominal pain. °· You have chest pain. °· You have shortness of breath. °· You faint. °· You have pain, swelling, or redness of your leg. °· You have heavy vaginal bleeding with blood clots. °MAKE SURE YOU: °· Understand these instructions. °· Will watch your condition. °· Will get help right away if you are not doing well or get worse. °  °This information is not intended to replace advice given to you by your health care provider. Make sure you discuss any questions you have with your health care provider. °  °Document   Released: 05/05/2005 Document Revised: 11/06/2014 Document Reviewed: 08/08/2013 °Elsevier Interactive Patient Education ©2016 Elsevier Inc. ° °

## 2015-12-21 ENCOUNTER — Telehealth: Payer: Self-pay | Admitting: Internal Medicine

## 2015-12-21 ENCOUNTER — Telehealth: Payer: Self-pay | Admitting: *Deleted

## 2015-12-21 NOTE — Telephone Encounter (Signed)
Routing to PCP CMA for approval

## 2015-12-21 NOTE — Telephone Encounter (Signed)
Pt. Called requesting a med refill on gabapentin (NEURONTIN) 300 MG capsule. Please f/u

## 2015-12-21 NOTE — Telephone Encounter (Signed)
Patient was last seen in office by Dr. Jarold Song for [facility]. Patient stated she is not able to pick up the Lyrica and is requesting a Gabapentin REFILL.

## 2015-12-27 NOTE — Telephone Encounter (Signed)
Pt. Called requesting a med refill for Gabapentin. Please f/u with pt.

## 2015-12-28 ENCOUNTER — Other Ambulatory Visit: Payer: Self-pay | Admitting: *Deleted

## 2015-12-28 ENCOUNTER — Other Ambulatory Visit: Payer: Self-pay | Admitting: Internal Medicine

## 2015-12-28 MED ORDER — GABAPENTIN 300 MG PO CAPS: 300.0000 mg | ORAL_CAPSULE | Freq: Three times a day (TID) | ORAL | Status: DC

## 2015-12-28 NOTE — Telephone Encounter (Signed)
Patient was last seen on 09/29/15. Patients Gabapentin has been refilled with 3 additional refills.

## 2015-12-30 ENCOUNTER — Ambulatory Visit: Payer: BLUE CROSS/BLUE SHIELD | Attending: Internal Medicine | Admitting: Internal Medicine

## 2015-12-30 ENCOUNTER — Encounter: Payer: Self-pay | Admitting: Internal Medicine

## 2015-12-30 ENCOUNTER — Other Ambulatory Visit: Payer: Self-pay | Admitting: *Deleted

## 2015-12-30 VITALS — BP 136/81 | HR 79 | Temp 98.5°F | Resp 18 | Ht 68.0 in | Wt 256.0 lb

## 2015-12-30 DIAGNOSIS — Z79899 Other long term (current) drug therapy: Secondary | ICD-10-CM | POA: Insufficient documentation

## 2015-12-30 DIAGNOSIS — I10 Essential (primary) hypertension: Secondary | ICD-10-CM

## 2015-12-30 DIAGNOSIS — Z8673 Personal history of transient ischemic attack (TIA), and cerebral infarction without residual deficits: Secondary | ICD-10-CM | POA: Insufficient documentation

## 2015-12-30 DIAGNOSIS — R51 Headache: Secondary | ICD-10-CM | POA: Insufficient documentation

## 2015-12-30 DIAGNOSIS — R7303 Prediabetes: Secondary | ICD-10-CM | POA: Insufficient documentation

## 2015-12-30 DIAGNOSIS — Z7982 Long term (current) use of aspirin: Secondary | ICD-10-CM | POA: Insufficient documentation

## 2015-12-30 DIAGNOSIS — Z888 Allergy status to other drugs, medicaments and biological substances status: Secondary | ICD-10-CM | POA: Insufficient documentation

## 2015-12-30 DIAGNOSIS — G44229 Chronic tension-type headache, not intractable: Secondary | ICD-10-CM | POA: Insufficient documentation

## 2015-12-30 LAB — GLUCOSE, POCT (MANUAL RESULT ENTRY): POC GLUCOSE: 88 mg/dL (ref 70–99)

## 2015-12-30 LAB — POCT GLYCOSYLATED HEMOGLOBIN (HGB A1C): Hemoglobin A1C: 5.7

## 2015-12-30 MED ORDER — LEVETIRACETAM ER 500 MG PO TB24: 500.0000 mg | ORAL_TABLET | Freq: Every day | ORAL | Status: DC

## 2015-12-30 MED ORDER — LISINOPRIL-HYDROCHLOROTHIAZIDE 20-25 MG PO TABS: 1.0000 | ORAL_TABLET | Freq: Every day | ORAL | Status: DC

## 2015-12-30 MED ORDER — GABAPENTIN 300 MG PO CAPS: 300.0000 mg | ORAL_CAPSULE | Freq: Three times a day (TID) | ORAL | Status: DC

## 2015-12-30 NOTE — Telephone Encounter (Signed)
Patients Gabapentin was reordered and sent to the preferred pharmacy which is [facility].

## 2015-12-30 NOTE — Patient Instructions (Signed)
DASH Eating Plan °DASH stands for "Dietary Approaches to Stop Hypertension." The DASH eating plan is a healthy eating plan that has been shown to reduce high blood pressure (hypertension). Additional health benefits may include reducing the risk of type 2 diabetes mellitus, heart disease, and stroke. The DASH eating plan may also help with weight loss. °WHAT DO I NEED TO KNOW ABOUT THE DASH EATING PLAN? °For the DASH eating plan, you will follow these general guidelines: °· Choose foods with a percent daily value for sodium of less than 5% (as listed on the food label). °· Use salt-free seasonings or herbs instead of table salt or sea salt. °· Check with your health care provider or pharmacist before using salt substitutes. °· Eat lower-sodium products, often labeled as "lower sodium" or "no salt added." °· Eat fresh foods. °· Eat more vegetables, fruits, and low-fat dairy products. °· Choose whole grains. Look for the word "whole" as the first word in the ingredient list. °· Choose fish and skinless chicken or turkey more often than red meat. Limit fish, poultry, and meat to 6 oz (170 g) each day. °· Limit sweets, desserts, sugars, and sugary drinks. °· Choose heart-healthy fats. °· Limit cheese to 1 oz (28 g) per day. °· Eat more home-cooked food and less restaurant, buffet, and fast food. °· Limit fried foods. °· Cook foods using methods other than frying. °· Limit canned vegetables. If you do use them, rinse them well to decrease the sodium. °· When eating at a restaurant, ask that your food be prepared with less salt, or no salt if possible. °WHAT FOODS CAN I EAT? °Seek help from a dietitian for individual calorie needs. °Grains °Whole grain or whole wheat bread. Brown rice. Whole grain or whole wheat pasta. Quinoa, bulgur, and whole grain cereals. Low-sodium cereals. Corn or whole wheat flour tortillas. Whole grain cornbread. Whole grain crackers. Low-sodium crackers. °Vegetables °Fresh or frozen vegetables  (raw, steamed, roasted, or grilled). Low-sodium or reduced-sodium tomato and vegetable juices. Low-sodium or reduced-sodium tomato sauce and paste. Low-sodium or reduced-sodium canned vegetables.  °Fruits °All fresh, canned (in natural juice), or frozen fruits. °Meat and Other Protein Products °Ground beef (85% or leaner), grass-fed beef, or beef trimmed of fat. Skinless chicken or turkey. Ground chicken or turkey. Pork trimmed of fat. All fish and seafood. Eggs. Dried beans, peas, or lentils. Unsalted nuts and seeds. Unsalted canned beans. °Dairy °Low-fat dairy products, such as skim or 1% milk, 2% or reduced-fat cheeses, low-fat ricotta or cottage cheese, or plain low-fat yogurt. Low-sodium or reduced-sodium cheeses. °Fats and Oils °Tub margarines without trans fats. Light or reduced-fat mayonnaise and salad dressings (reduced sodium). Avocado. Safflower, olive, or canola oils. Natural peanut or almond butter. °Other °Unsalted popcorn and pretzels. °The items listed above may not be a complete list of recommended foods or beverages. Contact your dietitian for more options. °WHAT FOODS ARE NOT RECOMMENDED? °Grains °White bread. White pasta. White rice. Refined cornbread. Bagels and croissants. Crackers that contain trans fat. °Vegetables °Creamed or fried vegetables. Vegetables in a cheese sauce. Regular canned vegetables. Regular canned tomato sauce and paste. Regular tomato and vegetable juices. °Fruits °Dried fruits. Canned fruit in light or heavy syrup. Fruit juice. °Meat and Other Protein Products °Fatty cuts of meat. Ribs, chicken wings, bacon, sausage, bologna, salami, chitterlings, fatback, hot dogs, bratwurst, and packaged luncheon meats. Salted nuts and seeds. Canned beans with salt. °Dairy °Whole or 2% milk, cream, half-and-half, and cream cheese. Whole-fat or sweetened yogurt. Full-fat   cheeses or blue cheese. Nondairy creamers and whipped toppings. Processed cheese, cheese spreads, or cheese  curds. °Condiments °Onion and garlic salt, seasoned salt, table salt, and sea salt. Canned and packaged gravies. Worcestershire sauce. Tartar sauce. Barbecue sauce. Teriyaki sauce. Soy sauce, including reduced sodium. Steak sauce. Fish sauce. Oyster sauce. Cocktail sauce. Horseradish. Ketchup and mustard. Meat flavorings and tenderizers. Bouillon cubes. Hot sauce. Tabasco sauce. Marinades. Taco seasonings. Relishes. °Fats and Oils °Butter, stick margarine, lard, shortening, ghee, and bacon fat. Coconut, palm kernel, or palm oils. Regular salad dressings. °Other °Pickles and olives. Salted popcorn and pretzels. °The items listed above may not be a complete list of foods and beverages to avoid. Contact your dietitian for more information. °WHERE CAN I FIND MORE INFORMATION? °National Heart, Lung, and [employer] Institute: www.nhlbi.nih.gov/health/health-topics/topics/dash/ °  °This information is not intended to replace advice given to you by your health care provider. Make sure you discuss any questions you have with your health care provider. °  °Document Released: 10/05/2011 Document Revised: 11/06/2014 Document Reviewed: 08/20/2013 °Elsevier Interactive Patient Education ©2016 Elsevier Inc. ° °Hypertension °Hypertension, commonly called high blood pressure, is when the force of blood pumping through your arteries is too strong. Your arteries are the blood vessels that carry blood from your heart throughout your body. A blood pressure reading consists of a higher number over a lower number, such as 110/72. The higher number (systolic) is the pressure inside your arteries when your heart pumps. The lower number (diastolic) is the pressure inside your arteries when your heart relaxes. Ideally you want your blood pressure below 120/80. °Hypertension forces your heart to work harder to pump blood. Your arteries may become narrow or stiff. Having untreated or uncontrolled hypertension can cause heart attack, stroke, kidney  disease, and other problems. °RISK FACTORS °Some risk factors for high blood pressure are controllable. Others are not.  °Risk factors you cannot control include:  °· Race. You may be at higher risk if you are African American. °· Age. Risk increases with age. °· Gender. Men are at higher risk than women before age 45 years. After age 65, women are at higher risk than men. °Risk factors you can control include: °· Not getting enough exercise or physical activity. °· Being overweight. °· Getting too much fat, sugar, calories, or salt in your diet. °· Drinking too much alcohol. °SIGNS AND SYMPTOMS °Hypertension does not usually cause signs or symptoms. Extremely high blood pressure (hypertensive crisis) may cause headache, anxiety, shortness of breath, and nosebleed. °DIAGNOSIS °To check if you have hypertension, your health care provider will measure your blood pressure while you are seated, with your arm held at the level of your heart. It should be measured at least twice using the same arm. Certain conditions can cause a difference in blood pressure between your right and left arms. A blood pressure reading that is higher than normal on one occasion does not mean that you need treatment. If it is not clear whether you have high blood pressure, you may be asked to return on a different day to have your blood pressure checked again. Or, you may be asked to monitor your blood pressure at home for 1 or more weeks. °TREATMENT °Treating high blood pressure includes making lifestyle changes and possibly taking medicine. Living a healthy lifestyle can help lower high blood pressure. You may need to change some of your habits. °Lifestyle changes may include: °· Following the DASH diet. This diet is high in fruits, vegetables, and whole   grains. It is low in salt, red meat, and added sugars. °· Keep your sodium intake below 2,300 mg per day. °· Getting at least 30-45 minutes of aerobic exercise at least 4 times per  week. °· Losing weight if necessary. °· Not smoking. °· Limiting alcoholic beverages. °· Learning ways to reduce stress. °Your health care provider may prescribe medicine if lifestyle changes are not enough to get your blood pressure under control, and if one of the following is true: °· You are 18-59 years of age and your systolic blood pressure is above 140. °· You are 60 years of age or older, and your systolic blood pressure is above 150. °· Your diastolic blood pressure is above 90. °· You have diabetes, and your systolic blood pressure is over 140 or your diastolic blood pressure is over 90. °· You have kidney disease and your blood pressure is above 140/90. °· You have heart disease and your blood pressure is above 140/90. °Your personal target blood pressure may vary depending on your medical conditions, your age, and other factors. °HOME CARE INSTRUCTIONS °· Have your blood pressure rechecked as directed by your health care provider.   °· Take medicines only as directed by your health care provider. Follow the directions carefully. Blood pressure medicines must be taken as prescribed. The medicine does not work as well when you skip doses. Skipping doses also puts you at risk for problems. °· Do not smoke.   °· Monitor your blood pressure at home as directed by your health care provider.  °SEEK MEDICAL CARE IF:  °· You think you are having a reaction to medicines taken. °· You have recurrent headaches or feel dizzy. °· You have swelling in your ankles. °· You have trouble with your vision. °SEEK IMMEDIATE MEDICAL CARE IF: °· You develop a severe headache or confusion. °· You have unusual weakness, numbness, or feel faint. °· You have severe chest or abdominal pain. °· You vomit repeatedly. °· You have trouble breathing. °MAKE SURE YOU:  °· Understand these instructions. °· Will watch your condition. °· Will get help right away if you are not doing well or get worse. °  °This information is not intended to  replace advice given to you by your health care provider. Make sure you discuss any questions you have with your health care provider. °  °Document Released: 10/16/2005 Document Revised: 03/02/2015 Document Reviewed: 08/08/2013 °Elsevier Interactive Patient Education ©2016 [employer] Inc. ° °

## 2015-12-30 NOTE — Progress Notes (Signed)
Patient ID: Alison Ward, female   DOB: [birth year], 55 y.o.   MRN: [id number]   Alison Ward, is a 55 y.o. female  [id number]  [id number]  DOB - Sep 23, 1961  Chief Complaint  Patient presents with  . Medication Refill    Gabapentin        Subjective:   Alison Ward is a 55 y.o. 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 Patient is complaining of ongoing headache described as variable from mild dull headaches to severe throbbing headaches and severity of 2 - 7 out of 10. She recently had TAH/BSO for uterine fibroid and a right dermoid cyst. Patient is doing well postop. She is requesting refill of her medications today. She follows up with [facility] for her headache is currently on gabapentin. She is also on Keppra for complex partial seizures. She does not smoke cigarettes, she does not drink alcohol. Patient has No chest pain, No abdominal pain - No Nausea, No new weakness tingling or numbness, No Cough - SOB.  Problem  Chronic Tension-Type Headache, Not Intractable    ALLERGIES: Allergies  Allergen Reactions  . Elavil [Amitriptyline] Other (See Comments)    DIZZINESS    PAST MEDICAL HISTORY: Past Medical History  Diagnosis Date  . Hypertension   . Heart murmur   . Stroke ([place]) 07/05/2013  . Chronic headaches     daily, bitemporal, associated with tingling in face  . Seizures ([place])     partial seizures last one on 10/22/15  On Keppra currently     MEDICATIONS AT HOME: Prior to Admission medications   Medication Sig Start Date End Date Taking? Authorizing Provider  aspirin EC 81 MG tablet Take 1 tablet (81 mg total) by mouth daily. 09/29/15  Yes Arnoldo Morale, MD  fluticasone (FLONASE) 50 MCG/ACT nasal spray Place 2 sprays into both nostrils daily. 09/29/15  Yes Arnoldo Morale, MD    gabapentin (NEURONTIN) 300 MG capsule Take 1 capsule (300 mg total) by mouth 3 (three) times daily. 12/30/15  Yes Tresa Garter, MD  levETIRAcetam (KEPPRA XR) 500 MG 24 hr tablet Take 1 tablet (500 mg total) by mouth daily. 12/30/15  Yes Tresa Garter, MD  lisinopril-hydrochlorothiazide (PRINZIDE,ZESTORETIC) 20-25 MG tablet Take 1 tablet by mouth daily. 12/30/15  Yes Tresa Garter, MD  sennosides (SENOKOT) 8.8 MG/5ML syrup Take 15 mLs by mouth once a week. Reported on 12/30/2015    Historical Provider, MD     Objective:   Filed Vitals:   12/30/15 1507  BP: 136/81  Pulse: 79  Temp: 98.5 F (36.9 C)  TempSrc: Oral  Resp: 18  Height: 5\' 8"  (1.727 m)  Weight: 256 lb (116.121 kg)  SpO2: 99%    Exam General appearance : Awake, alert, not in any distress. Speech Clear. Not toxic looking HEENT: Atraumatic and Normocephalic, pupils equally reactive to light and accomodation Neck: supple, no JVD. No cervical lymphadenopathy.  Chest:Good air entry bilaterally, no added sounds  CVS: S1 S2 regular, no murmurs.  Abdomen: Bowel sounds present, Non tender and not distended with no gaurding, rigidity or rebound. Extremities: B/L Lower Ext shows no edema, both legs are warm to touch Neurology: Awake alert, and oriented X 3, CN II-XII intact, Non focal Skin: No Rash  Data Review Lab Results  Component Value Date   HGBA1C 5.80 07/08/2015   HGBA1C 6.0* 07/06/2013  Assessment & Plan   1. Prediabetes  - POCT A1C - Glucose (CBG)  2. Chronic tension-type headache, not intractable  - gabapentin (NEURONTIN) 300 MG capsule; Take 1 capsule (300 mg total) by mouth 3 (three) times daily.  Dispense: 270 capsule; Refill: 3 - levETIRAcetam (KEPPRA XR) 500 MG 24 hr tablet; Take 1 tablet (500 mg total) by mouth daily.  Dispense: 90 tablet; Refill: 3  3. Essential hypertension  - lisinopril-hydrochlorothiazide (PRINZIDE,ZESTORETIC) 20-25 MG tablet; Take 1 tablet by mouth daily.   Dispense: 90 tablet; Refill: 3  Patient have been counseled extensively about nutrition and exercise  Return in about 3 months (around 03/31/2016) for Follow up HTN, Follow up Pain and comorbidities.  The patient was given clear instructions to go to ER or return to medical center if symptoms don't improve, worsen or new problems develop. The patient verbalized understanding. The patient was told to call to get lab results if they haven't heard anything in the next week.   This note has been created with Surveyor, quantity. Any transcriptional errors are unintentional.    Angelica Chessman, MD, [place], [place], [place], [place] and [place], [place]   12/30/2015, 3:24 PM

## 2015-12-30 NOTE — Progress Notes (Signed)
Patient is here for Med Refil  Patient complains of HA scaled currently at a 3.  Patient declined flu shot today.  Patients Gabapentin was reordered and sent to [facility]

## 2015-12-31 NOTE — Telephone Encounter (Signed)
Patient was seen in the office on 12/30/15. Patients Gabapentin was refilled on 12/28/15 and sent to [facility]. Patients medication was reordered and sent to [facility].

## 2016-01-03 ENCOUNTER — Telehealth: Payer: Self-pay | Admitting: Neurology

## 2016-01-03 NOTE — Telephone Encounter (Signed)
Patient called to request 2 month supply of levETIRAcetam (KEPPRA XR) 500 MG 24 hr tablet, will be travelling to the [place] end of March and plans to stay a couple of months, doesn't want to run out of the medication while she is gone.

## 2016-01-03 NOTE — Telephone Encounter (Signed)
Rn call patient about r/s her appt with Dr.Sethi in April 2017. Pt will be going to the [place] from April to May. She will not return until June 1. Rn explain her PCP last refill the keppra on 12/30/2015. Pt will call her PCP to get a 90 day supply of keppra medication. Pt is not currently working.

## 2016-01-19 ENCOUNTER — Ambulatory Visit: Payer: BLUE CROSS/BLUE SHIELD | Admitting: Obstetrics & Gynecology

## 2016-01-19 ENCOUNTER — Encounter: Payer: Self-pay | Admitting: Obstetrics & Gynecology

## 2016-01-19 VITALS — BP 140/80 | HR 96 | Temp 97.5°F | Ht 69.0 in | Wt 255.3 lb

## 2016-01-19 DIAGNOSIS — Z9889 Other specified postprocedural states: Secondary | ICD-10-CM

## 2016-01-19 NOTE — Progress Notes (Signed)
   Subjective:    Patient ID: Alison Ward, female    DOB: [birth year], 55 y.o.   MRN: [id number]  HPI 55 yo AA lady here for a 6 week post op visit after her TAH/BSO. She reports normal bowel and bladder function. She denies any post op problems. She used her percocet for only 2 weeks.   Review of Systems     Objective:   Physical Exam [id number] Breathing, conversing, and ambulating normally Abd- benign Incision- healed very well     Assessment & Plan:  Post op state- Doing well RTC 1 year/prn sooner

## 2016-01-24 ENCOUNTER — Other Ambulatory Visit: Payer: Self-pay

## 2016-02-22 ENCOUNTER — Ambulatory Visit: Payer: Self-pay | Admitting: Neurology

## 2016-04-06 ENCOUNTER — Ambulatory Visit: Payer: Self-pay | Admitting: Neurology

## 2016-04-07 ENCOUNTER — Encounter: Payer: Self-pay | Admitting: Neurology

## 2016-12-06 ENCOUNTER — Ambulatory Visit: Payer: Medicaid Other | Attending: Internal Medicine | Admitting: Internal Medicine

## 2016-12-06 ENCOUNTER — Encounter: Payer: Self-pay | Admitting: Internal Medicine

## 2016-12-06 VITALS — BP 134/84 | HR 70 | Temp 97.7°F | Resp 18 | Ht 68.0 in | Wt 251.0 lb

## 2016-12-06 DIAGNOSIS — I10 Essential (primary) hypertension: Secondary | ICD-10-CM | POA: Diagnosis present

## 2016-12-06 DIAGNOSIS — Z7982 Long term (current) use of aspirin: Secondary | ICD-10-CM | POA: Diagnosis not present

## 2016-12-06 DIAGNOSIS — G44229 Chronic tension-type headache, not intractable: Secondary | ICD-10-CM | POA: Diagnosis present

## 2016-12-06 DIAGNOSIS — R200 Anesthesia of skin: Secondary | ICD-10-CM | POA: Insufficient documentation

## 2016-12-06 DIAGNOSIS — R7303 Prediabetes: Secondary | ICD-10-CM

## 2016-12-06 LAB — LIPID PANEL
CHOL/HDL RATIO: 6.4 ratio — AB (ref <5.0)
Cholesterol: 245 mg/dL — ABNORMAL HIGH (ref <200)
HDL: 38 mg/dL — ABNORMAL LOW (ref >50)
LDL CALC: 172 mg/dL — AB (ref <100)
Triglycerides: 177 mg/dL — ABNORMAL HIGH (ref <150)
VLDL: 35 mg/dL — AB (ref <30)

## 2016-12-06 LAB — CBC WITH DIFFERENTIAL/PLATELET
Basophils Absolute: 40 cells/uL (ref 0–200)
Basophils Relative: 1 %
Eosinophils Absolute: 80 cells/uL (ref 15–500)
Eosinophils Relative: 2 %
HCT: 39.7 % (ref 35.0–45.0)
Hemoglobin: 12.8 g/dL (ref 11.7–15.5)
Lymphocytes Relative: 40 %
Lymphs Abs: 1600 cells/uL (ref 850–3900)
MCH: 27.9 pg (ref 27.0–33.0)
MCHC: 32.2 g/dL (ref 32.0–36.0)
MCV: 86.5 fL (ref 80.0–100.0)
MPV: 9.7 fL (ref 7.5–12.5)
Monocytes Absolute: 400 cells/uL (ref 200–950)
Monocytes Relative: 10 %
Neutro Abs: 1880 cells/uL (ref 1500–7800)
Neutrophils Relative %: 47 %
Platelets: 267 10*3/uL (ref 140–400)
RBC: 4.59 MIL/uL (ref 3.80–5.10)
RDW: 16.1 % — ABNORMAL HIGH (ref 11.0–15.0)
WBC: 4 10*3/uL (ref 3.8–10.8)

## 2016-12-06 LAB — COMPLETE METABOLIC PANEL WITH GFR
ALBUMIN: 4.2 g/dL (ref 3.6–5.1)
ALK PHOS: 51 U/L (ref 33–130)
ALT: 9 U/L (ref 6–29)
AST: 18 U/L (ref 10–35)
BILIRUBIN TOTAL: 0.9 mg/dL (ref 0.2–1.2)
BUN: 13 mg/dL (ref 7–25)
CO2: 30 mmol/L (ref 20–31)
CREATININE: 1.02 mg/dL (ref 0.50–1.05)
Calcium: 9.8 mg/dL (ref 8.6–10.4)
Chloride: 101 mmol/L (ref 98–110)
GFR, EST NON AFRICAN AMERICAN: 61 mL/min (ref >=60)
GFR, Est African American: 70 mL/min (ref >=60)
GLUCOSE: 84 mg/dL (ref 65–99)
Potassium: 4.1 mmol/L (ref 3.5–5.3)
SODIUM: 141 mmol/L (ref 135–146)
TOTAL PROTEIN: 7.9 g/dL (ref 6.1–8.1)

## 2016-12-06 MED ORDER — GABAPENTIN 300 MG PO CAPS: 300.0000 mg | ORAL_CAPSULE | Freq: Three times a day (TID) | ORAL | 3 refills | Status: DC

## 2016-12-06 MED ORDER — LISINOPRIL-HYDROCHLOROTHIAZIDE 20-25 MG PO TABS: 1.0000 | ORAL_TABLET | Freq: Every day | ORAL | 3 refills | Status: DC

## 2016-12-06 MED ORDER — LEVETIRACETAM ER 500 MG PO TB24: 500.0000 mg | ORAL_TABLET | Freq: Every day | ORAL | 3 refills | Status: DC

## 2016-12-06 MED ORDER — SENNOSIDES 8.8 MG/5ML PO SYRP: 15.0000 mL | ORAL_SOLUTION | ORAL | 3 refills | Status: DC

## 2016-12-06 MED FILL — LEVETIRACETAM ER 500 MG TAB: 500 | 30 days supply | Qty: 30 | Fill #0

## 2016-12-06 MED FILL — GABAPENTIN 300 MG CAPSULE: 300 | 30 days supply | Qty: 90 | Fill #0

## 2016-12-06 MED FILL — LISINOPRIL-HCTZ 20-25 MG TA: 20-25 | 90 days supply | Qty: 90 | Fill #0

## 2016-12-06 NOTE — Progress Notes (Signed)
Subjective:    Patient ID: Alison Ward, female    DOB: [birth year], 56 y.o.   MRN: [id number]  HPI Alison Ward is a 56 y.o. 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 She had TAH/BSO for uterine fibroid and a right dermoid cyst. here today for a follow up visit and medication refill. She just came back from a 10 month visit to the [place]. Patient is complaining of pain in bilateral leg, the pain is worse in the left leg. The pain is associated with numbness and tingling that spreads from her face down to the toes. Symptom started about a month ago. Activity makes it worse but is relieved with rest. She has tried avoiding strenuous activity to make it better. She also complain of intermittent headaches, she rates it currently a 3/10. She said the Neurontin works well for the headache. She denies any seizure activity.She denies chest pain, shortness of breath, nausea and vomiting. She has no cough, fever or chills. She does not smoke cigarettes, she does not drink alcohol.  Current Outpatient Prescriptions on File Prior to Visit  Medication Sig Dispense Refill  . aspirin EC 81 MG tablet Take 1 tablet (81 mg total) by mouth daily. 30 tablet 1  . fluticasone (FLONASE) 50 MCG/ACT nasal spray Place 2 sprays into both nostrils daily. 16 g 1   No current facility-administered medications on file prior to visit.    Allergies  Allergen Reactions  . Elavil [Amitriptyline] Other (See Comments)    DIZZINESS    Review of Systems  Constitutional: Negative for fatigue and unexpected weight change.  HENT: Negative.   Eyes: Negative for visual disturbance.  Respiratory: Negative for chest tightness and wheezing.     Cardiovascular: Negative for palpitations and leg swelling.  Gastrointestinal: Negative for abdominal pain, anal bleeding, blood in stool, constipation and diarrhea.  Endocrine: Negative for polydipsia, polyphagia and polyuria.  Genitourinary: Negative for dysuria, frequency, urgency and vaginal discharge.  Musculoskeletal: Negative for gait problem, myalgias and neck stiffness.  Skin: Negative.   Neurological: Positive for headaches (3/10). Negative for dizziness, syncope, speech difficulty and weakness.  Hematological: Negative.   Psychiatric/Behavioral: Negative for confusion and sleep disturbance.       Objective:  BP 134/84 (BP Location: Left Arm, Patient Position: Sitting, Cuff Size: Large)   Pulse 70   Temp 97.7 F (36.5 C) (Oral)   Resp 18   Ht 5\' 8"  (1.727 m)   Wt 251 lb (113.9 kg)   LMP 11/24/2011   SpO2 100%   BMI 38.16 kg/m     Physical Exam  Constitutional: She is oriented to person, place, and time. She appears well-developed and well-nourished. No distress.  HENT:  Head: Normocephalic and atraumatic.  Right Ear: External ear normal.  Left Ear: External ear normal.  Mouth/Throat: Oropharynx is clear and moist.  Eyes: Conjunctivae and EOM are normal. Pupils are equal, round, and reactive to light.  Neck: Normal range of motion. Neck supple. No thyromegaly present.  Cardiovascular: Normal rate, regular rhythm, normal heart sounds and intact distal pulses.   No murmur heard. Pulmonary/Chest: Effort normal and breath sounds normal. No respiratory distress.  Abdominal: Soft. Bowel sounds are normal. There is no tenderness.  Musculoskeletal: Normal range of motion. She exhibits  no edema.  Neurological: She is alert and oriented to person, place, and time. She has normal reflexes. Gait normal.  Hand grip strong and equal bilaterally   Skin: Skin is warm and dry.  Psychiatric: She has a normal mood and affect. Her behavior is normal. Thought content normal.       Assessment & Plan:   1. Chronic tension-type headache, not intractable  - levETIRAcetam (KEPPRA XR) 500 MG 24 hr tablet; Take 1 tablet (500 mg total) by mouth daily.  Dispense: 90 tablet; Refill: 3 - sennosides (SENOKOT) 8.8 MG/5ML syrup; Take 15 mLs by mouth once a week. Reported on 01/19/2016  Dispense: 240 mL; Refill: 3 - Ambulatory referral to Neurology  2. Essential hypertension, benign Controlled, continue low salt diet, do not add extra salt to meals - lisinopril-hydrochlorothiazide (PRINZIDE,ZESTORETIC) 20-25 MG tablet; Take 1 tablet by mouth daily.  Dispense: 90 tablet; Refill: 3 - CBC with Differential/Platelet - COMPLETE METABOLIC PANEL WITH GFR - Lipid panel   3. Left sided numbness Keep scheduled appointment - gabapentin (NEURONTIN) 300 MG capsule; Take 1 capsule (300 mg total) by mouth 3 (three) times daily.  Dispense: 270 capsule; Refill: 3 - Ambulatory referral to Neurology    Labs pending Health maintenance reviewed Diet and exercise encouraged Continue all meds  Follow up  in 6 months    Jari Favre, RN, BSN, AGNP-Student

## 2016-12-06 NOTE — Patient Instructions (Signed)
Hypertension Hypertension, commonly called high blood pressure, is when the force of blood pumping through your arteries is too strong. Your arteries are the blood vessels that carry blood from your heart throughout your body. A blood pressure reading consists of a higher number over a lower number, such as 110/72. The higher number (systolic) is the pressure inside your arteries when your heart pumps. The lower number (diastolic) is the pressure inside your arteries when your heart relaxes. Ideally you want your blood pressure below 120/80. Hypertension forces your heart to work harder to pump blood. Your arteries may become narrow or stiff. Having untreated or uncontrolled hypertension can cause heart attack, stroke, kidney disease, and other problems. What increases the risk? Some risk factors for high blood pressure are controllable. Others are not. Risk factors you cannot control include:  Race. You may be at higher risk if you are African American.  Age. Risk increases with age.  Gender. Men are at higher risk than women before age 45 years. After age 65, women are at higher risk than men. Risk factors you can control include:  Not getting enough exercise or physical activity.  Being overweight.  Getting too much fat, sugar, calories, or salt in your diet.  Drinking too much alcohol. What are the signs or symptoms? Hypertension does not usually cause signs or symptoms. Extremely high blood pressure (hypertensive crisis) may cause headache, anxiety, shortness of breath, and nosebleed. How is this diagnosed? To check if you have hypertension, your health care provider will measure your blood pressure while you are seated, with your arm held at the level of your heart. It should be measured at least twice using the same arm. Certain conditions can cause a difference in blood pressure between your right and left arms. A blood pressure reading that is higher than normal on one occasion does  not mean that you need treatment. If it is not clear whether you have high blood pressure, you may be asked to return on a different day to have your blood pressure checked again. Or, you may be asked to monitor your blood pressure at home for 1 or more weeks. How is this treated? Treating high blood pressure includes making lifestyle changes and possibly taking medicine. Living a healthy lifestyle can help lower high blood pressure. You may need to change some of your habits. Lifestyle changes may include:  Following the DASH diet. This diet is high in fruits, vegetables, and whole grains. It is low in salt, red meat, and added sugars.  Keep your sodium intake below 2,300 mg per day.  Getting at least 30-45 minutes of aerobic exercise at least 4 times per week.  Losing weight if necessary.  Not smoking.  Limiting alcoholic beverages.  Learning ways to reduce stress. Your health care provider may prescribe medicine if lifestyle changes are not enough to get your blood pressure under control, and if one of the following is true:  You are 18-59 years of age and your systolic blood pressure is above 140.  You are 60 years of age or older, and your systolic blood pressure is above 150.  Your diastolic blood pressure is above 90.  You have diabetes, and your systolic blood pressure is over 140 or your diastolic blood pressure is over 90.  You have kidney disease and your blood pressure is above 140/90.  You have heart disease and your blood pressure is above 140/90. Your personal target blood pressure may vary depending on your medical   conditions, your age, and other factors. Follow these instructions at home:  Have your blood pressure rechecked as directed by your health care provider.  Take medicines only as directed by your health care provider. Follow the directions carefully. Blood pressure medicines must be taken as prescribed. The medicine does not work as well when you skip  doses. Skipping doses also puts you at risk for problems.  Do not smoke.  Monitor your blood pressure at home as directed by your health care provider. Contact a health care provider if:  You think you are having a reaction to medicines taken.  You have recurrent headaches or feel dizzy.  You have swelling in your ankles.  You have trouble with your vision. Get help right away if:  You develop a severe headache or confusion.  You have unusual weakness, numbness, or feel faint.  You have severe chest or abdominal pain.  You vomit repeatedly.  You have trouble breathing. This information is not intended to replace advice given to you by your health care provider. Make sure you discuss any questions you have with your health care provider. Document Released: 10/16/2005 Document Revised: 03/23/2016 Document Reviewed: 08/08/2013 Elsevier Interactive Patient Education  2017 Elsevier Inc.  

## 2016-12-06 NOTE — Progress Notes (Signed)
Patient is here for FU HTN  Patient denies pain at this time.  Patient has taken medication today. Patient has had orange juice today.  Patient request a refill on lisinopril HCTZ.

## 2016-12-08 ENCOUNTER — Telehealth: Payer: Self-pay | Admitting: *Deleted

## 2016-12-08 ENCOUNTER — Other Ambulatory Visit: Payer: Self-pay | Admitting: Internal Medicine

## 2016-12-08 MED ORDER — ATORVASTATIN CALCIUM 40 MG PO TABS: 40.0000 mg | ORAL_TABLET | Freq: Every day | ORAL | 3 refills | Status: DC

## 2016-12-08 NOTE — Telephone Encounter (Signed)
-----   Message from Tresa Garter, MD sent at 12/08/2016 12:27 PM EST ----- Please inform patient that her lab results are mostly normal except for high cholesterol. We need to start a cholesterol lowering medicine, prescribed to the pharmacy

## 2016-12-08 NOTE — Telephone Encounter (Signed)
Patient verified DOB Patient is aware of labs being normal except for cholesterol being high. Patient is aware of atorvastatin being prescribed at the [facility] pharmacy. Patient expressed her understanding and had no further questions.

## 2016-12-12 ENCOUNTER — Ambulatory Visit: Payer: Self-pay | Attending: Internal Medicine

## 2016-12-22 ENCOUNTER — Ambulatory Visit: Payer: BLUE CROSS/BLUE SHIELD | Admitting: Obstetrics & Gynecology

## 2016-12-26 ENCOUNTER — Ambulatory Visit: Payer: Self-pay | Attending: Internal Medicine

## 2017-01-10 ENCOUNTER — Telehealth: Payer: Self-pay | Admitting: Internal Medicine

## 2017-01-10 NOTE — Telephone Encounter (Signed)
Pt requesting refill of aspirin EC 81 MG tablet  Last filled by Dr. Jarold Song

## 2017-01-15 ENCOUNTER — Other Ambulatory Visit: Payer: Self-pay | Admitting: Internal Medicine

## 2017-01-15 MED ORDER — ASPIRIN EC 81 MG PO TBEC: 81.0000 mg | DELAYED_RELEASE_TABLET | Freq: Every day | ORAL | 1 refills | Status: DC

## 2017-01-15 NOTE — Telephone Encounter (Signed)
Refilled

## 2017-01-18 MED FILL — GABAPENTIN 300 MG CAPSULE: 300 | 30 days supply | Qty: 90 | Fill #1

## 2017-01-18 MED FILL — LEVETIRACETAM ER 500 MG TAB: 500 | 30 days supply | Qty: 30 | Fill #1

## 2017-02-07 ENCOUNTER — Encounter: Payer: Self-pay | Admitting: Internal Medicine

## 2017-02-07 ENCOUNTER — Ambulatory Visit: Payer: Medicaid Other | Attending: Internal Medicine | Admitting: Internal Medicine

## 2017-02-07 VITALS — BP 136/88 | HR 65 | Temp 98.3°F | Resp 18 | Ht 68.0 in | Wt 251.8 lb

## 2017-02-07 DIAGNOSIS — Z79899 Other long term (current) drug therapy: Secondary | ICD-10-CM | POA: Insufficient documentation

## 2017-02-07 DIAGNOSIS — Z7982 Long term (current) use of aspirin: Secondary | ICD-10-CM | POA: Insufficient documentation

## 2017-02-07 DIAGNOSIS — Z8673 Personal history of transient ischemic attack (TIA), and cerebral infarction without residual deficits: Secondary | ICD-10-CM | POA: Insufficient documentation

## 2017-02-07 DIAGNOSIS — M79605 Pain in left leg: Secondary | ICD-10-CM | POA: Diagnosis present

## 2017-02-07 DIAGNOSIS — I10 Essential (primary) hypertension: Secondary | ICD-10-CM

## 2017-02-07 DIAGNOSIS — R200 Anesthesia of skin: Secondary | ICD-10-CM | POA: Diagnosis not present

## 2017-02-07 DIAGNOSIS — M79604 Pain in right leg: Secondary | ICD-10-CM | POA: Diagnosis present

## 2017-02-07 DIAGNOSIS — R569 Unspecified convulsions: Secondary | ICD-10-CM | POA: Diagnosis not present

## 2017-02-07 DIAGNOSIS — R7303 Prediabetes: Secondary | ICD-10-CM | POA: Insufficient documentation

## 2017-02-07 LAB — POCT GLYCOSYLATED HEMOGLOBIN (HGB A1C): Hemoglobin A1C: 5.7

## 2017-02-07 MED FILL — ATORVASTATIN 40 MG TABLET: 40 | 30 days supply | Qty: 30 | Fill #0

## 2017-02-07 NOTE — Progress Notes (Signed)
Patient is here for Leg pain  Patient complains of left leg pain being present post the stroke. Patient complains of right leg pain increasing at night with a radiating sharp pain.  Patient has taken medication today. Patient has had coffee today.

## 2017-02-07 NOTE — Patient Instructions (Signed)
Exercising to Stay Healthy Exercising regularly is important. It has many health benefits, such as:  Improving your overall fitness, flexibility, and endurance.  Increasing your bone density.  Helping with weight control.  Decreasing your body fat.  Increasing your muscle strength.  Reducing stress and tension.  Improving your overall health. In order to become healthy and stay healthy, it is recommended that you do moderate-intensity and vigorous-intensity exercise. You can tell that you are exercising at a moderate intensity if you have a higher heart rate and faster breathing, but you are still able to hold a conversation. You can tell that you are exercising at a vigorous intensity if you are breathing much harder and faster and cannot hold a conversation while exercising. How often should I exercise? Choose an activity that you enjoy and set realistic goals. Your health care provider can help you to make an activity plan that works for you. Exercise regularly as directed by your health care provider. This may include:  Doing resistance training twice each week, such as:  Push-ups.  Sit-ups.  Lifting weights.  Using resistance bands.  Doing a given intensity of exercise for a given amount of time. Choose from these options:  150 minutes of moderate-intensity exercise every week.  75 minutes of vigorous-intensity exercise every week.  A mix of moderate-intensity and vigorous-intensity exercise every week. Children, pregnant women, people who are out of shape, people who are overweight, and older adults may need to consult a health care provider for individual recommendations. If you have any sort of medical condition, be sure to consult your health care provider before starting a new exercise program. What are some exercise ideas? Some moderate-intensity exercise ideas include:  Walking at a rate of 1 mile in 15 minutes.  Biking.  Hiking.  Golfing.  Dancing. Some  vigorous-intensity exercise ideas include:  Walking at a rate of at least 4.5 miles per hour.  Jogging or running at a rate of 5 miles per hour.  Biking at a rate of at least 10 miles per hour.  Lap swimming.  Roller-skating or in-line skating.  Cross-country skiing.  Vigorous competitive sports, such as football, basketball, and soccer.  Jumping rope.  Aerobic dancing. What are some everyday activities that can help me to get exercise?  Yard work, such as:  Psychologist, educational.  Raking and bagging leaves.  Washing and waxing your car.  Pushing a stroller.  Shoveling snow.  Gardening.  Washing windows or floors. How can I be more active in my day-to-day activities?  Use the stairs instead of the elevator.  Take a walk during your lunch break.  If you drive, park your car farther away from work or school.  If you take public transportation, get off one stop early and walk the rest of the way.  Make all of your phone calls while standing up and walking around.  Get up, stretch, and walk around every 30 minutes throughout the day. What guidelines should I follow while exercising?  Do not exercise so much that you hurt yourself, feel dizzy, or get very short of breath.  Consult your health care provider before starting a new exercise program.  Wear comfortable clothes and shoes with good support.  Drink plenty of water while you exercise to prevent dehydration or heat stroke. Body water is lost during exercise and must be replaced.  Work out until you breathe faster and your heart beats faster. This information is not intended to replace advice  given to you by your health care provider. Make sure you discuss any questions you have with your health care provider. Document Released: 11/18/2010 Document Revised: 03/23/2016 Document Reviewed: 03/19/2014 Elsevier Interactive Patient Education  2017 [place] DASH stands for "Dietary  Approaches to Stop Hypertension." The DASH eating plan is a healthy eating plan that has been shown to reduce high blood pressure (hypertension). It may also reduce your risk for type 2 diabetes, heart disease, and stroke. The DASH eating plan may also help with weight loss. What are tips for following this plan? General guidelines   Avoid eating more than 2,300 mg (milligrams) of salt (sodium) a day. If you have hypertension, you may need to reduce your sodium intake to 1,500 mg a day.  Limit alcohol intake to no more than 1 drink a day for nonpregnant women and 2 drinks a day for men. One drink equals 12 oz of beer, 5 oz of wine, or 1 oz of hard liquor.  Work with your health care provider to maintain a healthy body weight or to lose weight. Ask what an ideal weight is for you.  Get at least 30 minutes of exercise that causes your heart to beat faster (aerobic exercise) most days of the week. Activities may include walking, swimming, or biking.  Work with your health care provider or diet and nutrition specialist (dietitian) to adjust your eating plan to your individual calorie needs. Reading food labels   Check food labels for the amount of sodium per serving. Choose foods with less than 5 percent of the Daily Value of sodium. Generally, foods with less than 300 mg of sodium per serving fit into this eating plan.  To find whole grains, look for the word "whole" as the first word in the ingredient list. Shopping   Buy products labeled as "low-sodium" or "no salt added."  Buy fresh foods. Avoid canned foods and premade or frozen meals. Cooking   Avoid adding salt when cooking. Use salt-free seasonings or herbs instead of table salt or sea salt. Check with your health care provider or pharmacist before using salt substitutes.  Do not fry foods. Cook foods using healthy methods such as baking, boiling, grilling, and broiling instead.  Cook with heart-healthy oils, such as olive, canola,  soybean, or sunflower oil. Meal planning    Eat a balanced diet that includes:  5 or more servings of fruits and vegetables each day. At each meal, try to fill half of your plate with fruits and vegetables.  Up to 6-8 servings of whole grains each day.  Less than 6 oz of lean meat, poultry, or fish each day. A 3-oz serving of meat is about the same size as a deck of cards. One egg equals 1 oz.  2 servings of low-fat dairy each day.  A serving of nuts, seeds, or beans 5 times each week.  Heart-healthy fats. Healthy fats called Omega-3 fatty acids are found in foods such as flaxseeds and coldwater fish, like sardines, salmon, and mackerel.  Limit how much you eat of the following:  Canned or prepackaged foods.  Food that is high in trans fat, such as fried foods.  Food that is high in saturated fat, such as fatty meat.  Sweets, desserts, sugary drinks, and other foods with added sugar.  Full-fat dairy products.  Do not salt foods before eating.  Try to eat at least 2 vegetarian meals each week.  Eat more home-cooked food and less  restaurant, buffet, and fast food.  When eating at a restaurant, ask that your food be prepared with less salt or no salt, if possible. What foods are recommended? The items listed may not be a complete list. Talk with your dietitian about what dietary choices are best for you. Grains  Whole-grain or whole-wheat bread. Whole-grain or whole-wheat pasta. Brown rice. [place]. Bulgur. Whole-grain and low-sodium cereals. Pita bread. Low-fat, low-sodium crackers. Whole-wheat flour tortillas. Vegetables  Fresh or frozen vegetables (raw, steamed, roasted, or grilled). Low-sodium or reduced-sodium tomato and vegetable juice. Low-sodium or reduced-sodium tomato sauce and tomato paste. Low-sodium or reduced-sodium canned vegetables. Fruits  All fresh, dried, or frozen fruit. Canned fruit in natural juice (without added sugar). Meat and other protein  foods  Skinless chicken or [place]. Ground chicken or [place]. Pork with fat trimmed off. Fish and seafood. Egg whites. Dried beans, peas, or lentils. Unsalted nuts, nut butters, and seeds. Unsalted canned beans. Lean cuts of beef with fat trimmed off. Low-sodium, lean deli meat. Dairy  Low-fat (1%) or fat-free (skim) milk. Fat-free, low-fat, or reduced-fat cheeses. Nonfat, low-sodium ricotta or cottage cheese. Low-fat or nonfat yogurt. Low-fat, low-sodium cheese. Fats and oils  Soft margarine without trans fats. Vegetable oil. Low-fat, reduced-fat, or light mayonnaise and salad dressings (reduced-sodium). Canola, safflower, olive, soybean, and sunflower oils. Avocado. Seasoning and other foods  Herbs. Spices. Seasoning mixes without salt. Unsalted popcorn and pretzels. Fat-free sweets. What foods are not recommended? The items listed may not be a complete list. Talk with your dietitian about what dietary choices are best for you. Grains  Baked goods made with fat, such as croissants, muffins, or some breads. Dry pasta or rice meal packs. Vegetables  Creamed or fried vegetables. Vegetables in a cheese sauce. Regular canned vegetables (not low-sodium or reduced-sodium). Regular canned tomato sauce and paste (not low-sodium or reduced-sodium). Regular tomato and vegetable juice (not low-sodium or reduced-sodium). Angie Fava. Olives. Fruits  Canned fruit in a light or heavy syrup. Fried fruit. Fruit in cream or butter sauce. Meat and other protein foods  Fatty cuts of meat. Ribs. Fried meat. Berniece Salines. Sausage. Bologna and other processed lunch meats. Salami. Fatback. Hotdogs. Bratwurst. Salted nuts and seeds. Canned beans with added salt. Canned or smoked fish. Whole eggs or egg yolks. Chicken or [place] with skin. Dairy  Whole or 2% milk, cream, and half-and-half. Whole or full-fat cream cheese. Whole-fat or sweetened yogurt. Full-fat cheese. Nondairy creamers. Whipped toppings. Processed cheese and cheese  spreads. Fats and oils  Butter. Stick margarine. Lard. Shortening. Ghee. Bacon fat. Tropical oils, such as coconut, palm kernel, or palm oil. Seasoning and other foods  Salted popcorn and pretzels. Onion salt, garlic salt, seasoned salt, table salt, and sea salt. Worcestershire sauce. Tartar sauce. Barbecue sauce. Teriyaki sauce. Soy sauce, including reduced-sodium. Steak sauce. Canned and packaged gravies. Fish sauce. Oyster sauce. Cocktail sauce. Horseradish that you find on the shelf. Ketchup. Mustard. Meat flavorings and tenderizers. Bouillon cubes. Hot sauce and Tabasco sauce. Premade or packaged marinades. Premade or packaged taco seasonings. Relishes. Regular salad dressings. Where to find more information:  National Heart, Lung, and [place]: https://[place]-e[place].com/  American Heart Association: www.heart.org Summary  The DASH eating plan is a healthy eating plan that has been shown to reduce high blood pressure (hypertension). It may also reduce your risk for type 2 diabetes, heart disease, and stroke.  With the DASH eating plan, you should limit salt (sodium) intake to 2,300 mg a day. If you have hypertension,  you may need to reduce your sodium intake to 1,500 mg a day.  When on the DASH eating plan, aim to eat more fresh fruits and vegetables, whole grains, lean proteins, low-fat dairy, and heart-healthy fats.  Work with your health care provider or diet and nutrition specialist (dietitian) to adjust your eating plan to your individual calorie needs. This information is not intended to replace advice given to you by your health care provider. Make sure you discuss any questions you have with your health care provider. Document Released: 10/05/2011 Document Revised: 10/09/2016 Document Reviewed: 10/09/2016 Elsevier Interactive Patient Education  2017 Elsevier Inc. Hypertension Hypertension, commonly called high blood pressure, is when the force of blood pumping through the arteries  is too strong. The arteries are the blood vessels that carry blood from the heart throughout the body. Hypertension forces the heart to work harder to pump blood and may cause arteries to become narrow or stiff. Having untreated or uncontrolled hypertension can cause heart attacks, strokes, kidney disease, and other problems. A blood pressure reading consists of a higher number over a lower number. Ideally, your blood pressure should be below 120/80. The first ("top") number is called the systolic pressure. It is a measure of the pressure in your arteries as your heart beats. The second ("bottom") number is called the diastolic pressure. It is a measure of the pressure in your arteries as the heart relaxes. What are the causes? The cause of this condition is not known. What increases the risk? Some risk factors for high blood pressure are under your control. Others are not. Factors you can change   Smoking.  Having type 2 diabetes mellitus, high cholesterol, or both.  Not getting enough exercise or physical activity.  Being overweight.  Having too much fat, sugar, calories, or salt (sodium) in your diet.  Drinking too much alcohol. Factors that are difficult or impossible to change   Having chronic kidney disease.  Having a family history of high blood pressure.  Age. Risk increases with age.  Race. You may be at higher risk if you are African-American.  Gender. Men are at higher risk than women before age 48. After age 42, women are at higher risk than men.  Having obstructive sleep apnea.  Stress. What are the signs or symptoms? Extremely high blood pressure (hypertensive crisis) may cause:  Headache.  Anxiety.  Shortness of breath.  Nosebleed.  Nausea and vomiting.  Severe chest pain.  Jerky movements you cannot control (seizures). How is this diagnosed? This condition is diagnosed by measuring your blood pressure while you are seated, with your arm resting on a  surface. The cuff of the blood pressure monitor will be placed directly against the skin of your upper arm at the level of your heart. It should be measured at least twice using the same arm. Certain conditions can cause a difference in blood pressure between your right and left arms. Certain factors can cause blood pressure readings to be lower or higher than normal (elevated) for a short period of time:  When your blood pressure is higher when you are in a health care provider's office than when you are at home, this is called white coat hypertension. Most people with this condition do not need medicines.  When your blood pressure is higher at home than when you are in a health care provider's office, this is called masked hypertension. Most people with this condition may need medicines to control blood pressure. If you have  a high blood pressure reading during one visit or you have normal blood pressure with other risk factors:  You may be asked to return on a different day to have your blood pressure checked again.  You may be asked to monitor your blood pressure at home for 1 week or longer. If you are diagnosed with hypertension, you may have other blood or imaging tests to help your health care provider understand your overall risk for other conditions. How is this treated? This condition is treated by making healthy lifestyle changes, such as eating healthy foods, exercising more, and reducing your alcohol intake. Your health care provider may prescribe medicine if lifestyle changes are not enough to get your blood pressure under control, and if:  Your systolic blood pressure is above 130.  Your diastolic blood pressure is above 80. Your personal target blood pressure may vary depending on your medical conditions, your age, and other factors. Follow these instructions at home: Eating and drinking   Eat a diet that is high in fiber and potassium, and low in sodium, added sugar, and fat.  An example eating plan is called the DASH (Dietary Approaches to Stop Hypertension) diet. To eat this way:  Eat plenty of fresh fruits and vegetables. Try to fill half of your plate at each meal with fruits and vegetables.  Eat whole grains, such as whole wheat pasta, brown rice, or whole grain bread. Fill about one quarter of your plate with whole grains.  Eat or drink low-fat dairy products, such as skim milk or low-fat yogurt.  Avoid fatty cuts of meat, processed or cured meats, and poultry with skin. Fill about one quarter of your plate with lean proteins, such as fish, chicken without skin, beans, eggs, and tofu.  Avoid premade and processed foods. These tend to be higher in sodium, added sugar, and fat.  Reduce your daily sodium intake. Most people with hypertension should eat less than 1,500 mg of sodium a day.  Limit alcohol intake to no more than 1 drink a day for nonpregnant women and 2 drinks a day for men. One drink equals 12 oz of beer, 5 oz of wine, or 1 oz of hard liquor. Lifestyle   Work with your health care provider to maintain a healthy body weight or to lose weight. Ask what an ideal weight is for you.  Get at least 30 minutes of exercise that causes your heart to beat faster (aerobic exercise) most days of the week. Activities may include walking, swimming, or biking.  Include exercise to strengthen your muscles (resistance exercise), such as pilates or lifting weights, as part of your weekly exercise routine. Try to do these types of exercises for 30 minutes at least 3 days a week.  Do not use any products that contain nicotine or tobacco, such as cigarettes and e-cigarettes. If you need help quitting, ask your health care provider.  Monitor your blood pressure at home as told by your health care provider.  Keep all follow-up visits as told by your health care provider. This is important. Medicines   Take over-the-counter and prescription medicines only as told by  your health care provider. Follow directions carefully. Blood pressure medicines must be taken as prescribed.  Do not skip doses of blood pressure medicine. Doing this puts you at risk for problems and can make the medicine less effective.  Ask your health care provider about side effects or reactions to medicines that you should watch for. Contact a health  care provider if:  You think you are having a reaction to a medicine you are taking.  You have headaches that keep coming back (recurring).  You feel dizzy.  You have swelling in your ankles.  You have trouble with your vision. Get help right away if:  You develop a severe headache or confusion.  You have unusual weakness or numbness.  You feel faint.  You have severe pain in your chest or abdomen.  You vomit repeatedly.  You have trouble breathing. Summary  Hypertension is when the force of blood pumping through your arteries is too strong. If this condition is not controlled, it may put you at risk for serious complications.  Your personal target blood pressure may vary depending on your medical conditions, your age, and other factors. For most people, a normal blood pressure is less than 120/80.  Hypertension is treated with lifestyle changes, medicines, or a combination of both. Lifestyle changes include weight loss, eating a healthy, low-sodium diet, exercising more, and limiting alcohol. This information is not intended to replace advice given to you by your health care provider. Make sure you discuss any questions you have with your health care provider. Document Released: 10/16/2005 Document Revised: 09/13/2016 Document Reviewed: 09/13/2016 Elsevier Interactive Patient Education  2017 [employer].

## 2017-02-07 NOTE — Progress Notes (Signed)
Alison Ward, is a 56 y.o. female  [id number]  [id number]  DOB - Mar 15, 1961  Chief Complaint  Patient presents with  . Leg Pain    bilateral      Subjective:   Alison Ward is a 56 y.o. 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 She is complaining of pain in her right leg mostly at night. She also has non-specific tingling and numbness on her face and her toes. She has not been able to see the Neurologist because of insurance issues and co-pay. She continues to do activities on her own but will prefer physical therapy referral. Patient has No headache, No chest pain, No abdominal pain - No Nausea, No new weakness tingling or numbness, No Cough - SOB.  No problems updated.  ALLERGIES: Allergies  Allergen Reactions  . Elavil [Amitriptyline] Other (See Comments)    DIZZINESS    PAST MEDICAL HISTORY: Past Medical History:  Diagnosis Date  . Chronic headaches    daily, bitemporal, associated with tingling in face  . Heart murmur   . Hypertension   . 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 Stroke [facility]) 07/05/2013    MEDICATIONS AT HOME: Prior to Admission medications   Medication Sig Start Date End Date Taking? Authorizing Provider  aspirin EC 81 MG tablet Take 1 tablet (81 mg total) by mouth daily. 01/15/17  Yes Tresa Garter, MD  atorvastatin (LIPITOR) 40 MG tablet Take 1 tablet (40 mg total) by mouth daily. 12/08/16  Yes Tresa Garter, MD  fluticasone (FLONASE) 50 MCG/ACT nasal spray Place 2 sprays into both nostrils daily. 09/29/15  Yes Arnoldo Morale, MD  gabapentin (NEURONTIN) 300 MG capsule Take 1 capsule (300 mg total) by mouth 3 (three) times daily. 12/06/16  Yes Tresa Garter, MD  levETIRAcetam (KEPPRA XR) 500 MG 24 hr tablet Take 1 tablet (500 mg total)  by mouth daily. 12/06/16  Yes Tresa Garter, MD  lisinopril-hydrochlorothiazide (PRINZIDE,ZESTORETIC) 20-25 MG tablet Take 1 tablet by mouth daily. 12/06/16  Yes Tresa Garter, MD  sennosides (SENOKOT) 8.8 MG/5ML syrup Take 15 mLs by mouth once a week. Reported on 01/19/2016 12/06/16  Yes Tresa Garter, MD    Objective:   Vitals:   02/07/17 0852  BP: 136/88  Pulse: 65  Resp: 18  Temp: 98.3 F (36.8 C)  TempSrc: Oral  SpO2: 98%  Weight: 251 lb 12.8 oz (114.2 kg)  Height: 5\' 8"  (1.727 m)   Exam General appearance : Awake, alert, not in any distress. Speech Clear. Not toxic looking, obese HEENT: Atraumatic and Normocephalic, pupils equally reactive to light and accomodation Neck: Supple, no JVD. No cervical lymphadenopathy.  Chest: Good air entry bilaterally, no added sounds  CVS: S1 S2 regular, no murmurs.  Abdomen: Bowel sounds present, Non tender and not distended with no gaurding, rigidity or rebound. Extremities: B/L Lower Ext shows no edema, both legs are warm to touch Neurology: Awake alert, and oriented X 3, CN II-XII intact, Non focal Skin: No Rash  Data Review Lab Results  Component Value Date   HGBA1C 5.7 02/07/2017   HGBA1C 5.7 12/30/2015   HGBA1C 5.80 07/08/2015    Assessment & Plan   1. Prediabetes  - HgB A1c remains 5.7%   2. Essential hypertension, benign  We have discussed target  BP range and blood pressure goal. I have advised patient to check BP regularly and to call us back or report to clinic if the numbers are consistently higher than 140/90. We discussed the importance of compliance with medical therapy and DASH diet recommended, consequences of uncontrolled hypertension discussed.  - continue current BP medications  3. Left sided numbness  - Ambulatory referral to Neurology - Ambulatory referral to Physical Therapy  Patient have been counseled extensively about nutrition and exercise. Other issues discussed during this visit  include: low cholesterol diet, weight control and daily exercise, importance of adherence with medications and regular follow-up. We also discussed long term complications of uncontrolled hypertension.   Return in about 3 months (around 05/09/2017) for Follow up HTN, Dyslipidemia, Follow up Pain and comorbidities.  The patient was given clear instructions to go to ER or return to medical center if symptoms don't improve, worsen or new problems develop. The patient verbalized understanding. The patient was told to call to get lab results if they haven't heard anything in the next week.   This note has been created with Surveyor, quantity. Any transcriptional errors are unintentional.    Angelica Chessman, MD, [place], Karilyn Cota, [place] and [place], [place]   02/07/2017, 9:32 AM

## 2017-02-16 MED FILL — LEVETIRACETAM ER 500 MG TAB: 500 | 30 days supply | Qty: 30 | Fill #2

## 2017-02-20 ENCOUNTER — Ambulatory Visit (INDEPENDENT_AMBULATORY_CARE_PROVIDER_SITE_OTHER): Payer: Self-pay | Admitting: Neurology

## 2017-02-20 ENCOUNTER — Encounter: Payer: Self-pay | Admitting: Neurology

## 2017-02-20 VITALS — BP 120/84 | HR 75 | Wt 249.5 lb

## 2017-02-20 DIAGNOSIS — R200 Anesthesia of skin: Secondary | ICD-10-CM

## 2017-02-20 DIAGNOSIS — R202 Paresthesia of skin: Secondary | ICD-10-CM

## 2017-02-20 MED ORDER — GABAPENTIN 300 MG PO CAPS: 300.0000 mg | ORAL_CAPSULE | Freq: Two times a day (BID) | ORAL | 3 refills | Status: DC

## 2017-02-20 MED ORDER — TOPIRAMATE ER 50 MG PO CAP24: 1.0000 | ORAL_CAPSULE | ORAL | 3 refills | Status: DC

## 2017-02-20 NOTE — Patient Instructions (Signed)
I had a long discussion with the patient, her sister and daughter regarding her left-sided paresthesias, tension headaches as well as complex partial seizures and answered questions. Continue Keppra and the current dose for seizures which appear stable. Reduce gabapentin to 300 twice daily due to side effects in the morning. Trial of Trokendi XR ( topiramate ER )  50 mg daily to help with her paresthesias. She was advised to keep appointment for physical therapy which has been ordered by her primary physician. She will return for follow-up in 3 months with Trinda Pascal is practitioner or call earlier if necessary

## 2017-02-20 NOTE — Progress Notes (Signed)
[facility] [address] [place] [address] [phone]       OFFICE FOLLOW UP VISIT NOTE  Alison. 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 She states she was sitting on a stool when she fainted and fell off and hit the back of her head. She did not lose consciousness for long and did not have any focal symptoms. EMS were called but patient chose not to go to the hospital and seek any help. Since then she's been having headaches which were initially mild and responded to Tylenol and ibuprofen but for the last couple of months the headaches are now occurring on a daily basis. She describes the headaches as variable from mild dull headaches to severe throbbing headaches which vary in severity from 2/10-7/10.Headache is not accompanied by nausea, vomiting or photophobia but she is bothered by loud sounds. Headaches do increase with physical activity and stress and are relieved by sleep and rest. She has been taking ibuprofen 800 mg once or twice a day with only partial relief. She has recently been given a prescription of Tylenol with Codeine but she has not started taking it yet. Patient denies any visual symptoms or focal neurological symptoms accompanying her headaches. She has not had any recent brain imaging study. She denies any neck pain and raducular pain but does complain of mild tightness of the neck and shoulder muscles. 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 She required ventriculostomy at that time. She did recover from that quite well but did  have headaches which lasted for 6 months to year and then gradually  the headaches disappeared and she did quite well for nearly a year without any headaches. She was tried on a variety of headache medications at that time including Depakote, Topamax, amitriptyline but these did not work or she had side effects. Tizanidine probably helped her the most. She also has a new complaint now of left leg numbness which she describes as tingling sensation involving the anterior and lateral   aspect of her left thigh. This also began since her fall. She has seen her primary physician who has prescribed gabapentin which she takes 300mg  twice daily but it makes her dizzy and does not particularly help her numbness. She denies any severe back pain or radicular pain or trouble with walking, balance or bladder control. She has not had any back x-rays or MRI. Update 11/04/2015 : She returns for follow-up after last visit 2 months ago. She had noticed improvement in her headaches as well as left thigh paresthesias though she still has numbness is she does not get burning sensation as often. She is on Neurontin 300mg  three times daily. She is not able to afford Lyrica due to cost. She had MRI scan lumbar spine done on 09/29/15 which I personally reviewed showed mild spinal stenosis at L4-5 with without definite pinched nerve. Incidental cystic lesion was noted on the right side in the pelvis. 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 Patient was  seen in the emergency room on that day with severe abdominal pain. She was seen by gynecologist and has abdominal hysterectomy and bilateral oophorectomy planned for February 2017. She also had episode of near-syncope on 10/22/2015. Her grandson was with her and called for help as apparently the patient was unresponsive to him and did not answer questions. Inquiry she admits to 3 somewhat  similar episodes over the last 4 months since October. She feels like she is about to pass out but most of the time she consented down. On one occasion she fell on the floor. She may briefly lose consciousness but does not appear to be confused and dazed. She had EEG done on 10/05/15 which I have reviewed was normal. She has no documented history of seizures. Update 02/20/2017 : She returns for follow-up after last visit more than a year ago. She is accompanied by her sister and her daughter. He states she's had no further episodes of altered consciousness or seizure-like activity ever since she's been taking Keppra which is tolerating well without side effects. She states her tension headaches are improved on the gabapentin she does complain of some dizziness and imbalance in the morning and takes the morning dose take several hours before her balance is good. She is often bump into objects in the morning. She states her thigh paresthesias with a moped K went for a trip to the [place]. Since December of last year she noticed intermittent increasing numbness in the left leg and at times even relate radiata into involving the left arm occasionally the left face as well. She has not had any recent. She saw a chiropractor recently and after manipulation of her back and neck resulted in worsening headache and paresthesias. She has seen her primary physician is referral to physical therapy but it has not yet been started ROS:   14 system review of systems is positive for   imbalance, dizziness, headache, numbness,  and all other systems negative PMH:  Past Medical History:  Diagnosis Date  . Chronic headaches    daily, bitemporal, associated with tingling in face  . Heart murmur   . Hypertension   . Seizures ([place])    partial seizures last one on 10/22/15  On Keppra currently   . Stroke [facility]) 07/05/2013    Social History:  Social History   Social History  . Marital status: Divorced    Spouse name: N/A    . Number of children: 3  . Years of education: college   Occupational History  . [employer]    Social History Main Topics  . Smoking status: Never Smoker  . Smokeless tobacco: Never Used  . Alcohol use No  . Drug use: No  . Sexual activity: Not on file   Other Topics Concern  . Not on file   Social History Narrative  . No narrative on file    Medications:   Current Outpatient Prescriptions on File Prior to Visit  Medication Sig Dispense Refill  . aspirin EC 81 MG tablet Take 1 tablet (81 mg total) by mouth daily. 30 tablet 1  . atorvastatin (LIPITOR) 40 MG tablet Take 1 tablet (40 mg total) by mouth daily. 90 tablet 3  . fluticasone (FLONASE) 50 MCG/ACT nasal spray Place 2 sprays into both nostrils daily. 16 g 1  . levETIRAcetam (KEPPRA XR) 500 MG 24 hr tablet Take 1 tablet (500 mg total) by mouth daily. 90 tablet 3  . lisinopril-hydrochlorothiazide (PRINZIDE,ZESTORETIC) 20-25 MG tablet Take  1 tablet by mouth daily. 90 tablet 3   No current facility-administered medications on file prior to visit.     Allergies:   Allergies  Allergen Reactions  . Elavil [Amitriptyline] Other (See Comments)    DIZZINESS    Physical Exam General: Obese middle-aged African-American lady, seated, in no evident distress Head: head normocephalic and atraumatic.   Neck: supple with no carotid or supraclavicular bruits Cardiovascular: regular rate and rhythm, no murmurs Musculoskeletal: no deformity Skin:  no rash/petichiae Vascular:  Normal pulses all extremities  Neurologic Exam Mental Status: Awake and fully alert. Oriented to place and time. Recent and remote memory intact. Attention span, concentration and fund of knowledge appropriate. Mood and affect appropriate.  . Cranial Nerves: Fundoscopic exam not done  . Pupils equal, briskly reactive to light. Extraocular movements full without nystagmus. Visual fields full to confrontation. Hearing intact. Facial sensation intact.  Face, tongue, palate moves normally and symmetrically.  Motor: Normal bulk and tone. Normal strength in all tested extremity muscles. Diminished fine finger movements on the left. Orbits right over left approximately. Minimum action tremor of the left outstretched approximately. Absent tremor at rest. Sensory.: intact  position and vibratory sensation. Subjective decrease touch and pinprick sensation in the left thigh on the anterior lateral aspect of the knee. Coordination: Rapid alternating movements normal in all extremities. Finger-to-nose and heel-to-shin performed accurately bilaterally. Gait and Station: Arises from chair without difficulty. Stance is normal. Gait demonstrates normal stride length and balance . Able to heel, toe and tandem walk without difficulty.  Reflexes: 1+ and symmetric. Toes downgoing.       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 Left thigh and body paresthesias of unclear etiology. Remote history of hypertensive right temporal intracerebral hemorrhage in 2014. 3 episodes of brief altered consciousness  In 2016 possibly complex partial seizures    PLAN: I had a long discussion with the patient, her sister and daughter regarding her left-sided paresthesias, tension headaches as well as complex partial seizures and answered questions. Continue Keppra and the current dose for seizures which appear stable. Reduce gabapentin to 300 twice daily due to side effects in the morning. Trial of Trokendi XR ( topiramate ER )  50 mg daily to help with her paresthesias. She was advised to keep appointment for physical therapy which has been ordered by her primary physician. Greater than 50% time during this 25 minute visit was spent on counseling and coordination of care about her tension headaches, anesthesias and possible Compex partial seizures and answered questions She will return for  follow-up in 3 months with Meghan is practitioner or call earlier if necessary.  Antony Contras, MD  Note: This document was prepared with digital dictation and possible smart phrase technology. Any transcriptional errors that result from this process are unintentional.

## 2017-02-28 ENCOUNTER — Ambulatory Visit (INDEPENDENT_AMBULATORY_CARE_PROVIDER_SITE_OTHER): Payer: Self-pay | Admitting: Obstetrics & Gynecology

## 2017-02-28 VITALS — BP 123/78 | HR 70 | Ht 68.0 in | Wt 247.3 lb

## 2017-02-28 DIAGNOSIS — Z01419 Encounter for gynecological examination (general) (routine) without abnormal findings: Secondary | ICD-10-CM

## 2017-02-28 NOTE — Progress Notes (Signed)
Mammogram scholarship

## 2017-02-28 NOTE — Progress Notes (Signed)
Subjective:    Alison Ward is a 56 y.o. S P3 (71, 12, and 55 yo kids, 5 grands) female who presents for an annual exam. The patient has no complaints today. The patient is not sexually active. GYN screening history: last pap: was normal. The patient wears seatbelts: yes. The patient participates in regular exercise: yes. Has the patient ever been transfused or tattooed?: no. The patient reports that there is not domestic violence in her life.   Menstrual History: OB History    No data available      Menarche age: 10 Patient's last menstrual period was 11/24/2011.    The following portions of the patient's history were reviewed and updated as appropriate: allergies, current medications, past family history, past medical history, past social history, past surgical history and problem list.  Review of Systems Pertinent items are noted in HPI.   FH- no breast/gyn/colon cancer Had a colonscopy in 2016, due again in 2021   Objective:    BP 123/78   Pulse 70   Ht 5\' 8"  (1.727 m)   Wt 247 lb 4.8 oz (112.2 kg)   LMP 11/24/2011   BMI 37.60 kg/m   General Appearance:    Alert, cooperative, no distress, appears stated age  Head:    Normocephalic, without obvious abnormality, atraumatic  Eyes:    PERRL, conjunctiva/corneas clear, EOM's intact, fundi    benign, both eyes  Ears:    Normal TM's and external ear canals, both ears  Nose:   Nares normal, septum midline, mucosa normal, no drainage    or sinus tenderness  Throat:   Lips, mucosa, and tongue normal; teeth and gums normal  Neck:   Supple, symmetrical, trachea midline, no adenopathy;    thyroid:  no enlargement/tenderness/nodules; no carotid   bruit or JVD  Back:     Symmetric, no curvature, ROM normal, no CVA tenderness  Lungs:     Clear to auscultation bilaterally, respirations unlabored  Chest Wall:    No tenderness or deformity   Heart:    Regular rate and rhythm, S1 and S2 normal, no murmur, rub   or gallop  Breast Exam:     No tenderness, masses, or nipple abnormality  Abdomen:     Soft, non-tender, bowel sounds active all four quadrants,    no masses, no organomegaly  Genitalia:    Normal female without lesion, discharge or tenderness, some vulvovaginal atrophy, No masses or tenderness with bimanual exam     Extremities:   Extremities normal, atraumatic, no cyanosis or edema  Pulses:   2+ and symmetric all extremities  Skin:   Skin color, texture, turgor normal, no rashes or lesions  Lymph nodes:   Cervical, supraclavicular, and axillary nodes normal  Neurologic:   CNII-XII intact, normal strength, sensation and reflexes    throughout  .    Assessment:    Healthy female exam.    Plan:     Mammogram.

## 2017-03-01 MED FILL — LISINOPRIL-HCTZ 20-25 MG TA: 20-25 | 90 days supply | Qty: 90 | Fill #1

## 2017-03-01 MED FILL — GABAPENTIN 300 MG CAPSULE: 300 | 30 days supply | Qty: 90 | Fill #2

## 2017-03-01 MED FILL — ?ATORVASTATIN 40MG TABLET: 40 | 30 days supply | Qty: 30 | Fill #1

## 2017-03-02 ENCOUNTER — Ambulatory Visit: Payer: Medicaid Other | Attending: Internal Medicine | Admitting: Physical Therapy

## 2017-03-02 DIAGNOSIS — M6281 Muscle weakness (generalized): Secondary | ICD-10-CM | POA: Insufficient documentation

## 2017-03-02 DIAGNOSIS — R2681 Unsteadiness on feet: Secondary | ICD-10-CM | POA: Diagnosis present

## 2017-03-02 DIAGNOSIS — R2689 Other abnormalities of gait and mobility: Secondary | ICD-10-CM

## 2017-03-05 NOTE — Therapy (Signed)
[facility] [address] [place], [place], [address] Phone: [phone]   Fax:  [phone]  Physical Therapy Evaluation  Patient Details  Name: Alison Ward MRN: [id number] Date of Birth: [birth year] Referring Provider: Angelica Chessman MD  Encounter Date: 03/02/2017      PT End of Session - 03/05/17 6387    Visit Number 1   Number of Visits 13   Date for PT Re-Evaluation 05/02/17   Authorization Type CAFA Approved 100% through 06/26/17   PT Start Time 1018   PT Stop Time 1104   PT Time Calculation (min) 46 min   Activity Tolerance Patient tolerated treatment well  c/o fatigue at end of session   Behavior During Therapy [facility] for tasks assessed/performed      Past Medical History:  Diagnosis Date  . Chronic headaches    daily, bitemporal, associated with tingling in face  . Heart murmur   . Hypertension   . Seizures ([place])    partial seizures last one on 10/22/15  On Keppra currently   . Stroke [facility]) 07/05/2013    Past Surgical History:  Procedure Laterality Date  . ABDOMINAL HYSTERECTOMY N/A 12/15/2015   Procedure: HYSTERECTOMY ABDOMINAL;  Surgeon: Emily Filbert, MD;  Location: [facility];  Service: Gynecology;  Laterality: N/A;  . ceserean section    . SALPINGOOPHORECTOMY Bilateral 12/15/2015   Procedure: SALPINGO OOPHORECTOMY;  Surgeon: Emily Filbert, MD;  Location: [facility];  Service: Gynecology;  Laterality: Bilateral;  . VENTRICULOSTOMY Left 07/07/2013   Procedure: VENTRICULOSTOMY;  Surgeon: Ophelia Charter, MD;  Location: [facility];  Service: Neurosurgery;  Laterality: Left;  Left Ventriculostomy and Removal of Right Vetriculostomy    There were no vitals filed for this visit.       Subjective Assessment - 03/05/17 0915    Subjective Pt has history of CVA in 2014, with residual L sided numbness, especially after standing, sitting, walking too long.  The numbness in leg triggers numbness in whole L  side, have massive headache.  Because of this, I usually stop activity and rest when L leg numbness comes on.   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     Currently in Pain? Yes   Pain Score 3    Pain Location Leg   Pain Orientation Left   Pain Descriptors / Indicators Numbness   Pain Type Chronic pain   Pain Onset More than a month ago   Pain Frequency Intermittent   Aggravating Factors  staying in one position for too long   Pain Relieving Factors changing positions   Effect of Pain on Daily Activities PT will attempt to address pain through exercises, stretches and education in ways to manage pain.            [facility] PT Assessment - 03/05/17 0917      Assessment   Medical Diagnosis L sided numbness   Referring Provider Angelica Chessman MD   Onset Date/Surgical Date 02/07/17  MD visit     Precautions   Precautions Other (comment);Fall  Pt not driving due to hx of seizures   Precaution Comments History of seizures     Balance Screen   Has the patient fallen in the past 6  months No   Has the patient had a decrease in activity level because of a fear of falling?  No  decreased in activity b/c not able to control LLE numbness   Is the patient reluctant to leave their home because of a fear of falling?  No     Home Environment   Living Environment Private residence   Living Arrangements Other relatives  Currently living with sister   Available Help at Discharge Family   Type of [place] Access Level entry   [place] None     Prior Function   Level of Independence Independent;Independent with community mobility without device   Leisure Enjoys  cooking, shopping, but limited now     Observation/Other Assessments   Focus on Therapeutic Outcomes (FOTO)  Functional STatus Intake score 56; Neuro QOL 41.6%     Sensation   Light Touch Impaired by gross assessment     Strength   Strength Assessment Site Hip;Knee;Ankle   Right/Left Hip Left;Right   Right Hip Flexion 4/5   Left Hip Flexion 3+/5   Right/Left Knee Right;Left   Right Knee Flexion 4/5   Right Knee Extension 4/5   Left Knee Flexion 4/5   Left Knee Extension 3+/5   Right/Left Ankle Left;Right   Right Ankle Dorsiflexion 4/5   Left Ankle Dorsiflexion 3+/5     Transfers   Transfers Sit to Stand;Stand to Sit   Sit to Stand 6: Modified independent (Device/Increase time);With upper extremity assist;From chair/3-in-1   Stand to Sit 6: Modified independent (Device/Increase time);With upper extremity assist;To chair/3-in-1     Ambulation/Gait   Ambulation/Gait Yes   Ambulation/Gait Assistance 5: Supervision   Ambulation Distance (Feet) 200 Feet   Assistive device None   Gait Pattern Step-through pattern  Slowed gait with head turns, some veering with gait   Ambulation Surface Level;Indoor   Gait velocity 12.47 sec = 2.63 ft/sec     Standardized Balance Assessment   Standardized Balance Assessment Timed Up and Go Test;Dynamic Gait Index     Dynamic Gait Index   Level Surface Mild Impairment   Change in Gait Speed Mild Impairment   Gait with Horizontal Head Turns Moderate Impairment   Gait with Vertical Head Turns Moderate Impairment   Gait and Pivot Turn Mild Impairment  4 sec   Step Over Obstacle Mild Impairment   Step Around Obstacles Normal   Steps Moderate Impairment   Total Score 14   DGI comment: Scores <19/24 indicate increased risk of falls     Timed Up and Go Test   Normal TUG (seconds) 13.25  First trial 15.60; avg = 14.25 sec   TUG Comments Scores >13.5 seconds indicate increased fall risk.                             PT  Short Term Goals - 03/05/17 0350      PT SHORT TERM GOAL #1   Title Pt will be independent with HEP to address strength, balance and reduction of pain.  TARGET 04/02/17   Time 4   Period Weeks   Status New     PT SHORT TERM GOAL #2   Title Pt will improve TUG score to <13.5 seconds 3 of 3 trials for decreased fall risk.   Time 4   Period Weeks   Status New     PT SHORT TERM GOAL #  3   Title Pt will improve DGI score to at least 17/24 for decreased fall risk.   Time 4   Period Weeks   Status New     PT SHORT TERM GOAL #4   Title Pt will be able to perform at least 10 minutes of standing/walking activities with <3/10 numbness or pain, for improved tolerance and participation in functional activities.   Time 4   Period Weeks   Status New           PT Long Term Goals - 03/05/17 0930      PT LONG TERM GOAL #1   Title Pt will verbalize understanding of fall prevention in home environment.  6 WEEK GOAL TARGET 05/02/17   Time 6   Period Weeks   Status New     PT LONG TERM GOAL #2   Title Pt will improve DGI score to at least 20/24 for decreased fall risk.   Time 6   Period Weeks   Status New     PT LONG TERM GOAL #3   Title Pt will improve gait velocity to at least 3 ft/sec for improved community gait.   Time 6   Period Weeks   Status New     PT LONG TERM GOAL #4   Title Pt will verbalize plans for continued community fitness upon D/C from PT.   Time 6   Period Weeks   Status New               Plan - 03/05/17 5277    Clinical Impression Statement Pt is a 56 year old female with history of CVA in 2014.  Pt presents to OPPT with continued LLE numbness and weakness, which she feels is limiting overall mobility.  Pt presents with decreased strength, decreased balance, decreased timing and coordination with gait, pain.  Pt is at fall risk per DGI and TUG scores.  Pt will benefit from skilled PT to address the above stated deficits to improve overall functional  mobility and decrease fall risk.   Rehab Potential Good   PT Frequency 2x / week   PT Duration 6 weeks  plus eval   PT Treatment/Interventions ADLs/Self Care Home Management;Functional mobility training;Gait training;Therapeutic activities;Therapeutic exercise;Balance training;Neuromuscular re-education;Patient/family education   PT Next Visit Plan Initiate HEP to address LLE strength and balance; dynamic balance and gait activities   Consulted and Agree with Plan of Care Patient      Patient will benefit from skilled therapeutic intervention in order to improve the following deficits and impairments:  Abnormal gait, Decreased activity tolerance, Decreased balance, Decreased mobility, Decreased strength, Difficulty walking, Impaired sensation, Pain  Visit Diagnosis: Muscle weakness (generalized)  Other abnormalities of gait and mobility  Unsteadiness on feet     Problem List Patient Active Problem List   Diagnosis Date Noted  . Chronic tension-type headache, not intractable 12/30/2015  . Post-operative state 12/15/2015  . Simple partial seizure disorder ([place]) 11/04/2015  . Left sided numbness 09/25/2015  . Acute kidney injury ([place]) 09/25/2015  . Leukopenia 09/25/2015  . Tension vascular headache 09/07/2015  . Meralgia paresthetica of left side 09/07/2015  . Fainting spell 09/07/2015  . Prediabetes 09/02/2015  . Stye 08/06/2014  . Essential hypertension, benign 04/16/2014  . CVA (cerebral vascular accident) ([place]) 04/16/2014  . Headache 04/16/2014  . Tension headache 03/06/2014  . Obesity, unspecified 08/13/2013  . 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[place]) 07/22/2013  . ICH (intracerebral hemorrhage) ([place]) 07/21/2013  . Hypokalemia  07/21/2013  . Intracerebral hemorrhage ([place]) 07/06/2013  . Essential hypertension 02/24/2009    Rosemaria Inabinet W. 03/05/2017, 9:33 AM Frazier Butt., PT [facility] [address] [place], [place], [address] Phone: [phone]   Fax:  [phone]  Name: Alison Ward MRN: [id number] Date of Birth: Mar 12, 1961

## 2017-03-08 ENCOUNTER — Encounter: Payer: Self-pay | Admitting: Physical Therapy

## 2017-03-08 ENCOUNTER — Other Ambulatory Visit: Payer: Self-pay | Admitting: *Deleted

## 2017-03-08 ENCOUNTER — Ambulatory Visit: Payer: Medicaid Other | Admitting: Physical Therapy

## 2017-03-08 DIAGNOSIS — M6281 Muscle weakness (generalized): Secondary | ICD-10-CM | POA: Diagnosis not present

## 2017-03-08 DIAGNOSIS — R2681 Unsteadiness on feet: Secondary | ICD-10-CM

## 2017-03-08 DIAGNOSIS — R2689 Other abnormalities of gait and mobility: Secondary | ICD-10-CM

## 2017-03-08 MED ORDER — TOPIRAMATE ER 50 MG PO CAP24: 1.0000 | ORAL_CAPSULE | Freq: Every day | ORAL | 3 refills | Status: DC

## 2017-03-08 NOTE — Therapy (Signed)
[facility] [address] [place], [place], [address] Phone: [phone]   Fax:  [phone]  Physical Therapy Treatment  Patient Details  Name: Alison Ward MRN: [id number] Date of Birth: 01-25-1961 Referring Provider: Angelica Chessman MD  Encounter Date: 03/08/2017      PT End of Session - 03/08/17 1536    Visit Number 2   Number of Visits 13   Date for PT Re-Evaluation 05/02/17   Authorization Type CAFA Approved 100% through 06/26/17   PT Start Time 1450   PT Stop Time 1528   PT Time Calculation (min) 38 min   Equipment Utilized During Treatment Gait belt   Activity Tolerance Patient limited by fatigue  c/o fatigue at end of session   Behavior During Therapy [facility] for tasks assessed/performed      Past Medical History:  Diagnosis Date  . Chronic headaches    daily, bitemporal, associated with tingling in face  . Heart murmur   . Hypertension   . Seizures ([place])    partial seizures last one on 10/22/15  On Keppra currently   . Stroke [facility]) 07/05/2013    Past Surgical History:  Procedure Laterality Date  . ABDOMINAL HYSTERECTOMY N/A 12/15/2015   Procedure: HYSTERECTOMY ABDOMINAL;  Surgeon: Emily Filbert, MD;  Location: [facility];  Service: Gynecology;  Laterality: N/A;  . ceserean section    . SALPINGOOPHORECTOMY Bilateral 12/15/2015   Procedure: SALPINGO OOPHORECTOMY;  Surgeon: Emily Filbert, MD;  Location: [facility];  Service: Gynecology;  Laterality: Bilateral;  . VENTRICULOSTOMY Left 07/07/2013   Procedure: VENTRICULOSTOMY;  Surgeon: Ophelia Charter, MD;  Location: [facility];  Service: Neurosurgery;  Laterality: Left;  Left Ventriculostomy and Removal of Right Vetriculostomy    There were no vitals filed for this visit.      Subjective Assessment - 03/08/17 1508    Subjective Pt expressed concern to avoid any triggers (anxiety and numbness) that would cause headache which tends to cause seizures (lasts  seizure was in December 2016).  Pt reported that last therapy session she had to sit in the waiting room for 10 min after session before she felt ok to have someone drive her home.  Main problem is the triggers for the headache which include numbness.Waiting to get medication for the numbness which is coming in soon.     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     Currently in Pain? Yes   Pain Score 3    Pain Location Head   Pain Orientation Anterior   Pain Descriptors / Indicators Pressure   Pain Type Chronic pain   Pain Onset More than a month ago   Pain Frequency Constant                         [facility] Adult PT Treatment/Exercise - 03/08/17 0001      Ambulation/Gait   Ambulation/Gait Yes   Ambulation/Gait Assistance 5: Supervision   Ambulation/Gait Assistance Details working on balance with head turns   Ambulation Distance (Feet) 200 Feet x2   Assistive device None   Gait Pattern Step-through pattern   Ambulation Surface Level;Indoor     Knee/Hip Exercises: Aerobic   Nustep level  2, 6 min, all extremities.             Balance Exercises - 03/08/17 1558      Balance Exercises: Standing   Gait with Head Turns Forward   Tandem Gait Forward;Upper extremity support;1 rep   Sit to Stand Time x5 without UE support, cues for body mechanica           PT Education - 03/08/17 1535    Education provided Yes   Education Details Initiated HEP   Person(s) Educated Patient   Methods Explanation;Demonstration;Verbal cues;Handout   Comprehension Verbalized understanding;Returned demonstration;Need further instruction;Verbal cues required          PT Short Term Goals - 03/05/17 1856       PT SHORT TERM GOAL #1   Title Pt will be independent with HEP to address strength, balance and reduction of pain.  TARGET 04/02/17   Time 4   Period Weeks   Status New     PT SHORT TERM GOAL #2   Title Pt will improve TUG score to <13.5 seconds 3 of 3 trials for decreased fall risk.   Time 4   Period Weeks   Status New     PT SHORT TERM GOAL #3   Title Pt will improve DGI score to at least 17/24 for decreased fall risk.   Time 4   Period Weeks   Status New     PT SHORT TERM GOAL #4   Title Pt will be able to perform at least 10 minutes of standing/walking activities with <3/10 numbness or pain, for improved tolerance and participation in functional activities.   Time 4   Period Weeks   Status New           PT Long Term Goals - 03/05/17 0930      PT LONG TERM GOAL #1   Title Pt will verbalize understanding of fall prevention in home environment.  6 WEEK GOAL TARGET 05/02/17   Time 6   Period Weeks   Status New     PT LONG TERM GOAL #2   Title Pt will improve DGI score to at least 20/24 for decreased fall risk.   Time 6   Period Weeks   Status New     PT LONG TERM GOAL #3   Title Pt will improve gait velocity to at least 3 ft/sec for improved community gait.   Time 6   Period Weeks   Status New     PT LONG TERM GOAL #4   Title Pt will verbalize plans for continued community fitness upon D/C from PT.   Time 6   Period Weeks   Status New               Plan - 03/08/17 1545    Clinical Impression Statement Pt wasa very concerned to not do anything in therapy that would trigger numbness in LE or headache.  Initiated 1 exercise for balance and strength, performed dynamic gait for balance training, and trialed nu step.  Pt reported needing frequent rest breaks but not increase in headache or numbness during session was reported.                                            Rehab Potential Good   PT Frequency 2x / week   PT Duration 6 weeks  PT  Treatment/Interventions ADLs/Self Care Home Management;Functional mobility training;Gait training;Therapeutic activities;Therapeutic exercise;Balance training;Neuromuscular re-education;Patient/family education   PT Next Visit Plan Gradually add to HEP to address LLE strength and balance; dynamic balance and gait activities      Patient will benefit from skilled therapeutic intervention in order to improve the following deficits and impairments:  Abnormal gait, Decreased activity tolerance, Decreased balance, Decreased mobility, Decreased strength, Difficulty walking, Impaired sensation, Pain  Visit Diagnosis: Muscle weakness (generalized)  Other abnormalities of gait and mobility  Unsteadiness on feet     Problem List Patient Active Problem List   Diagnosis Date Noted  . Chronic tension-type headache, not intractable 12/30/2015  . Post-operative state 12/15/2015  . Simple partial seizure disorder ([place]) 11/04/2015  . Left sided numbness 09/25/2015  . Acute kidney injury ([place]) 09/25/2015  . Leukopenia 09/25/2015  . Tension vascular headache 09/07/2015  . Meralgia paresthetica of left side 09/07/2015  . Fainting spell 09/07/2015  . Prediabetes 09/02/2015  . Stye 08/06/2014  . Essential hypertension, benign 04/16/2014  . CVA (cerebral vascular accident) ([place]) 04/16/2014  . Headache 04/16/2014  . Tension headache 03/06/2014  . Obesity, unspecified 08/13/2013  . 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 ICH (intracerebral hemorrhage) ([place]) 07/21/2013  . Hypokalemia 07/21/2013  . Intracerebral hemorrhage ([place]) 07/06/2013  . Essential hypertension 02/24/2009    Bjorn Loser, PTA  03/08/17, 3:58 PM [facility] [address] [place], [place], [address] Phone: [phone]   Fax:  [phone]  Name: NAJAH LIVERMAN MRN: [id number] Date of Birth: 10-04-1961

## 2017-03-08 NOTE — Telephone Encounter (Signed)
PRINTED FOR PASS PROGRAM 

## 2017-03-08 NOTE — Patient Instructions (Addendum)
Functional Quadriceps: Sit to Stand    Sit on edge of chair, feet flat on floor. Stand upright, extending knees fully. Repeat __5__ times per set. Do _2___ sets per session. Do _1___ sessions per day.  http://orth.exer.us/735   Copyright  VHI. All rights reserved.

## 2017-03-09 ENCOUNTER — Other Ambulatory Visit: Payer: Self-pay | Admitting: Obstetrics and Gynecology

## 2017-03-09 DIAGNOSIS — Z1231 Encounter for screening mammogram for malignant neoplasm of breast: Secondary | ICD-10-CM

## 2017-03-14 ENCOUNTER — Ambulatory Visit: Payer: Medicaid Other | Admitting: Physical Therapy

## 2017-03-15 ENCOUNTER — Ambulatory Visit: Payer: Medicaid Other | Admitting: Physical Therapy

## 2017-03-15 ENCOUNTER — Encounter: Payer: Self-pay | Admitting: Physical Therapy

## 2017-03-15 DIAGNOSIS — M6281 Muscle weakness (generalized): Secondary | ICD-10-CM | POA: Diagnosis not present

## 2017-03-15 DIAGNOSIS — R2689 Other abnormalities of gait and mobility: Secondary | ICD-10-CM

## 2017-03-15 DIAGNOSIS — R2681 Unsteadiness on feet: Secondary | ICD-10-CM

## 2017-03-15 NOTE — Therapy (Signed)
[facility] [address] [place], [place], [address] Phone: [phone]   Fax:  [phone]  Physical Therapy Treatment  Patient Details  Name: Alison Ward MRN: [id number] Date of Birth: 11-Dec-1960 Referring Provider: Angelica Chessman MD  Encounter Date: 03/15/2017      PT End of Session - 03/15/17 1446    Visit Number 3   Number of Visits 13   Date for PT Re-Evaluation 05/02/17   Authorization Type CAFA Approved 100% through 06/26/17   PT Start Time 0240   PT Stop Time 1445   PT Time Calculation (min) 40 min   Equipment Utilized During Treatment Gait belt   Activity Tolerance Patient limited by fatigue  c/o fatigue at end of session   Behavior During Therapy [facility] for tasks assessed/performed      Past Medical History:  Diagnosis Date  . Chronic headaches    daily, bitemporal, associated with tingling in face  . Heart murmur   . Hypertension   . Seizures ([place])    partial seizures last one on 10/22/15  On Keppra currently   . Stroke [facility]) 07/05/2013    Past Surgical History:  Procedure Laterality Date  . ABDOMINAL HYSTERECTOMY N/A 12/15/2015   Procedure: HYSTERECTOMY ABDOMINAL;  Surgeon: Emily Filbert, MD;  Location: [facility];  Service: Gynecology;  Laterality: N/A;  . ceserean section    . SALPINGOOPHORECTOMY Bilateral 12/15/2015   Procedure: SALPINGO OOPHORECTOMY;  Surgeon: Emily Filbert, MD;  Location: [facility];  Service: Gynecology;  Laterality: Bilateral;  . VENTRICULOSTOMY Left 07/07/2013   Procedure: VENTRICULOSTOMY;  Surgeon: Ophelia Charter, MD;  Location: [facility];  Service: Neurosurgery;  Laterality: Left;  Left Ventriculostomy and Removal of Right Vetriculostomy    There were no vitals filed for this visit.      Subjective Assessment - 03/15/17 1412    Subjective Pt didn't have to sit as long after last session (to regain activity tolerance).  Pt reports that she feels like she is getting a  little stronger with the exercise since last visit. Pt really liked the Nu step.  Feels no numbness at beginnning of session.   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     Currently in Pain? Yes   Pain Score 1    Pain Location Head   Pain Orientation Anterior   Pain Descriptors / Indicators Pressure   Pain Type Chronic pain   Pain Onset More than a month ago   Pain Frequency Constant                         [facility] Adult PT Treatment/Exercise - 03/15/17 0001      Knee/Hip Exercises: Aerobic   Nustep level 2, 10 min, all extremities.             Balance Exercises - 03/15/17 1438      Balance Exercises: Standing   Tandem Stance Eyes open;Intermittent upper extremity support;4 reps;30 secs   Sit to Stand Time 5x2 without UE support,           PT Education - 03/15/17 1442    Education provided Yes   Education Details Added balance exercise.   Person(s) Educated  Patient   Methods Explanation;Demonstration;Verbal cues;Handout   Comprehension Verbalized understanding;Verbal cues required;Need further instruction          PT Short Term Goals - 03/05/17 5631      PT SHORT TERM GOAL #1   Title Pt will be independent with HEP to address strength, balance and reduction of pain.  TARGET 04/02/17   Time 4   Period Weeks   Status New     PT SHORT TERM GOAL #2   Title Pt will improve TUG score to <13.5 seconds 3 of 3 trials for decreased fall risk.   Time 4   Period Weeks   Status New     PT SHORT TERM GOAL #3   Title Pt will improve DGI score to at least 17/24 for decreased fall risk.   Time 4   Period Weeks   Status New     PT SHORT TERM GOAL #4   Title Pt will be able to  perform at least 10 minutes of standing/walking activities with <3/10 numbness or pain, for improved tolerance and participation in functional activities.   Time 4   Period Weeks   Status New           PT Long Term Goals - 03/05/17 0930      PT LONG TERM GOAL #1   Title Pt will verbalize understanding of fall prevention in home environment.  6 WEEK GOAL TARGET 05/02/17   Time 6   Period Weeks   Status New     PT LONG TERM GOAL #2   Title Pt will improve DGI score to at least 20/24 for decreased fall risk.   Time 6   Period Weeks   Status New     PT LONG TERM GOAL #3   Title Pt will improve gait velocity to at least 3 ft/sec for improved community gait.   Time 6   Period Weeks   Status New     PT LONG TERM GOAL #4   Title Pt will verbalize plans for continued community fitness upon D/C from PT.   Time 6   Period Weeks   Status New               Plan - 03/15/17 1426    Clinical Impression Statement Pt progressed tolerating more time on Nu step with no reported numbness.  Reveiwed and performed HEP exercises and added 1 more balance exercises; pt required intermittent UE support for tandem stance.   Rehab Potential Good   PT Frequency 2x / week   PT Duration 6 weeks   PT Treatment/Interventions ADLs/Self Care Home Management;Functional mobility training;Gait training;Therapeutic activities;Therapeutic exercise;Balance training;Neuromuscular re-education;Patient/family education   PT Next Visit Plan Gradually add to HEP to address LLE strength and balance (next visit add SLS); dynamic balance and gait activities      Patient will benefit from skilled therapeutic intervention in order to improve the following deficits and impairments:  Abnormal gait, Decreased activity tolerance, Decreased balance, Decreased mobility, Decreased strength, Difficulty walking, Impaired sensation, Pain, Decreased range of motion  Visit Diagnosis: Muscle weakness (generalized)  Other  abnormalities of gait and mobility  Unsteadiness on feet     Problem List Patient Active Problem List   Diagnosis Date Noted  . Chronic tension-type headache, not intractable 12/30/2015  . Post-operative state 12/15/2015  . Simple partial seizure disorder ([place]) 11/04/2015  . Left sided numbness 09/25/2015  . Acute kidney injury ([place]) 09/25/2015  . Leukopenia 09/25/2015  .  Tension vascular headache 09/07/2015  . Meralgia paresthetica of left side 09/07/2015  . Fainting spell 09/07/2015  . Prediabetes 09/02/2015  . Stye 08/06/2014  . Essential hypertension, benign 04/16/2014  . CVA (cerebral vascular accident) ([place]) 04/16/2014  . Headache 04/16/2014  . Tension headache 03/06/2014  . Obesity, unspecified 08/13/2013  . 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[place]) 07/22/2013  . ICH (intracerebral hemorrhage) ([place]) 07/21/2013  . Hypokalemia 07/21/2013  . Intracerebral hemorrhage ([place]) 07/06/2013  . Essential hypertension 02/24/2009    Bjorn Loser, PTA  03/15/17, 3:48 PM [facility] [address] [place], [place], [address] Phone: [phone]   Fax:  [phone]  Name: Alison Ward MRN: [id number] Date of Birth: Feb 04, 1961

## 2017-03-15 NOTE — Patient Instructions (Addendum)
Tandem Stance    Right foot in front of left, heel touching toe both feet "straight ahead". Stand on Foot Triangle of Support with both feet. Balance in this position __30_ seconds. Do with left foot in front of right. x2  Copyright  VHI. All rights reserved.

## 2017-03-20 ENCOUNTER — Encounter: Payer: Self-pay | Admitting: Physical Therapy

## 2017-03-20 ENCOUNTER — Ambulatory Visit: Payer: Medicaid Other | Admitting: Physical Therapy

## 2017-03-20 DIAGNOSIS — M6281 Muscle weakness (generalized): Secondary | ICD-10-CM

## 2017-03-20 DIAGNOSIS — R2681 Unsteadiness on feet: Secondary | ICD-10-CM

## 2017-03-20 DIAGNOSIS — R2689 Other abnormalities of gait and mobility: Secondary | ICD-10-CM

## 2017-03-20 NOTE — Patient Instructions (Addendum)
Single Leg - Eyes Open    Holding support, lift right leg while maintaining balance over other leg. Progress to removing hands from support surface for longer periods of time. Hold__10__ seconds. Repeat with other leg. Repeat _3_ times each leg. Do _1-2_ sessions per day.  Copyright  VHI. All rights reserved.     "I love a Database administrator   At counter for balance as needed: high knee marching forward and then backward. 3 second pauses with each knee lift.  Repeat 3 laps each way. Do _1-2_ sessions per day. http://gt2.exer.us/345   Copyright  VHI. All rights reserved.   Side-Stepping   At counter for balance as needed: Walk to left side with eyes open. Take even steps, leading with same foot. Make sure each foot lifts off the floor. Repeat in opposite direction. Keep feet pointed toward counter. Repeat for 3 laps each way.  Do _1-2__ sessions per day.  Copyright  VHI. All rights reserved.   Feet Heel-Toe "Tandem"   At counter: Arms at sides, walk a straight line forward bringing one foot directly in front of the other, and then a straight line backwards bringing one foot directly behind the other one.  Repeat for _3 laps each way. Do _1-2_ sessions per day.  Copyright  VHI. All rights reserved.

## 2017-03-20 NOTE — Therapy (Signed)
[facility] [address] [place], [place], [address] Phone: [phone]   Fax:  [phone]  Physical Therapy Treatment  Patient Details  Name: Alison Ward MRN: [id number]  Date of Birth: [birth year] Referring Provider: Angelica Chessman MD  Encounter Date: 03/20/2017      PT End of Session - 03/20/17 0944    Visit Number 4   Number of Visits 13   Date for PT Re-Evaluation 05/02/17   Authorization Type CAFA Approved 100% through 06/26/17   PT Start Time 9323  pt late for appt   PT Stop Time 1015   PT Time Calculation (min) 34 min   Equipment Utilized During Treatment Gait belt   Activity Tolerance Patient limited by fatigue  c/o fatigue at end of session   Behavior During Therapy [facility] for tasks assessed/performed      Past Medical History:  Diagnosis Date  . Chronic headaches    daily, bitemporal, associated with tingling in face  . Heart murmur   . Hypertension   . Seizures ([place])    partial seizures last one on 10/22/15  On Keppra currently   . Stroke [facility]) 07/05/2013    Past Surgical History:  Procedure Laterality Date  . ABDOMINAL HYSTERECTOMY N/A 12/15/2015   Procedure: HYSTERECTOMY ABDOMINAL;  Surgeon: Emily Filbert, MD;  Location: [place] [facility];  Service: Gynecology;  Laterality: N/A;  . ceserean section    . SALPINGOOPHORECTOMY Bilateral 12/15/2015   Procedure: SALPINGO OOPHORECTOMY;  Surgeon: Emily Filbert, MD;  Location: [place] [facility];  Service: Gynecology;  Laterality: Bilateral;  . VENTRICULOSTOMY Left 07/07/2013   Procedure: VENTRICULOSTOMY;  Surgeon: Ophelia Charter, MD;  Location: [place] [facility];  Service: Neurosurgery;  Laterality: Left;  Left Ventriculostomy and Removal of Right Vetriculostomy    There were no vitals filed for this visit.      Subjective Assessment - 03/20/17 0942    Subjective No new complaints. No falls. Having some incr pain in left LE compared to other mornings. Got approved for  Medicaid, has not recieved her card as yet.    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     Currently in Pain? Yes   Pain Score 4    Pain Location Leg   Pain Orientation Left   Pain Descriptors / Indicators Numbness;Pressure   Pain Type Chronic pain   Pain Onset More than a month ago   Pain Frequency Constant   Aggravating Factors  staying in one position for too long   Pain Relieving Factors changing positions             [facility] Adult PT Treatment/Exercise - 03/20/17 0947      Transfers   Number of Reps 10 reps;1 set   Comments sit<>stands with no UE support from low mat. cues for full upright posture and slow, controlled descent with sitting down.      Ambulation/Gait   Ambulation/Gait --     High Level Balance   High Level Balance Activities Side stepping;Tandem walking;Marching forwards;Marching backwards  tandem fwd/bwd,    High Level Balance Comments at counter top with UE support as needed for balance: 3 laps each with min guard assist for balance, cues on posture and ex form. (Added these to HEP)  Neuro Re-ed    Neuro Re-ed Details  standing at counter top: tandem standing for 10 sec's x 2 reps each foot forward light UE support progressing to no UE support; single leg standing x 10 sec's x 2 reps each leg with light UE support progressing to no UE support each rep. (added this to HEP)                           Knee/Hip Exercises: Aerobic   Nustep Level 3 x 10 minutes with UE's/LE's, goal >/= 40 steps per minute for strengthening and activity tolerance.             PT Education - 03/20/17 1008    Education provided Yes   Education Details added single leg stance and dynamic balance  activites at counter top   Person(s) Educated Patient   Methods Explanation;Demonstration;Verbal cues;Handout   Comprehension Verbalized understanding;Returned demonstration;Verbal cues required;Need further instruction          PT Short Term Goals - 03/05/17 2542      PT SHORT TERM GOAL #1   Title Pt will be independent with HEP to address strength, balance and reduction of pain.  TARGET 04/02/17   Time 4   Period Weeks   Status New     PT SHORT TERM GOAL #2   Title Pt will improve TUG score to <13.5 seconds 3 of 3 trials for decreased fall risk.   Time 4   Period Weeks   Status New     PT SHORT TERM GOAL #3   Title Pt will improve DGI score to at least 17/24 for decreased fall risk.   Time 4   Period Weeks   Status New     PT SHORT TERM GOAL #4   Title Pt will be able to perform at least 10 minutes of standing/walking activities with <3/10 numbness or pain, for improved tolerance and participation in functional activities.   Time 4   Period Weeks   Status New           PT Long Term Goals - 03/05/17 0930      PT LONG TERM GOAL #1   Title Pt will verbalize understanding of fall prevention in home environment.  6 WEEK GOAL TARGET 05/02/17   Time 6   Period Weeks   Status New     PT LONG TERM GOAL #2   Title Pt will improve DGI score to at least 20/24 for decreased fall risk.   Time 6   Period Weeks   Status New     PT LONG TERM GOAL #3   Title Pt will improve gait velocity to at least 3 ft/sec for improved community gait.   Time 6   Period Weeks   Status New     PT LONG TERM GOAL #4   Title Pt will verbalize plans for continued community fitness upon D/C from PT.   Time 6   Period Weeks   Status New               Plan - 03/20/17 0945    Clinical Impression Statement today's skilled session continued to address strengthening, activity tolerance and balance with no reports of increased pain/headache. Added new balance ex's today to HEP without any  issues. Pt is making steady progress toward goals and should benefit from continued PT to progress toward unmet goals.    Rehab Potential Good   PT  Frequency 2x / week   PT Duration 6 weeks   PT Treatment/Interventions ADLs/Self Care Home Management;Functional mobility training;Gait training;Therapeutic activities;Therapeutic exercise;Balance training;Neuromuscular re-education;Patient/family education   PT Next Visit Plan  dynamic balance and gait activities, continue to work on strengthening of LE's.       Patient will benefit from skilled therapeutic intervention in order to improve the following deficits and impairments:  Abnormal gait, Decreased activity tolerance, Decreased balance, Decreased mobility, Decreased strength, Difficulty walking, Impaired sensation, Pain, Decreased range of motion  Visit Diagnosis: Muscle weakness (generalized)  Other abnormalities of gait and mobility  Unsteadiness on feet     Problem List Patient Active Problem List   Diagnosis Date Noted  . Chronic tension-type headache, not intractable 12/30/2015  . Post-operative state 12/15/2015  . Simple partial seizure disorder ([place]) 11/04/2015  . Left sided numbness 09/25/2015  . Acute kidney injury ([place]) 09/25/2015  . Leukopenia 09/25/2015  . Tension vascular headache 09/07/2015  . Meralgia paresthetica of left side 09/07/2015  . Fainting spell 09/07/2015  . Prediabetes 09/02/2015  . Stye 08/06/2014  . Essential hypertension, benign 04/16/2014  . CVA (cerebral vascular accident) ([place]) 04/16/2014  . Headache 04/16/2014  . Tension headache 03/06/2014  . Obesity, unspecified 08/13/2013  . 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[place]) 07/22/2013  . ICH (intracerebral hemorrhage) ([place]) 07/21/2013  . Hypokalemia 07/21/2013  . Intracerebral hemorrhage ([place]) 07/06/2013  . Essential hypertension 02/24/2009    Willow Ora, PTA, [facility] [address], [place],  [place] [address] [phone] 03/20/17, 2:57 PM   Name: Alison Ward MRN: [id number] Date of Birth: 1961-08-07

## 2017-03-21 ENCOUNTER — Ambulatory Visit: Payer: Medicaid Other | Admitting: Physical Therapy

## 2017-03-22 ENCOUNTER — Ambulatory Visit
Admission: RE | Admit: 2017-03-22 | Discharge: 2017-03-22 | Disposition: A | Discharge status: Home / Self Care | Payer: No Typology Code available for payment source | Source: Ambulatory Visit | Attending: Obstetrics and Gynecology | Admitting: Obstetrics and Gynecology

## 2017-03-22 ENCOUNTER — Encounter (HOSPITAL_COMMUNITY): Payer: Self-pay

## 2017-03-22 ENCOUNTER — Ambulatory Visit (HOSPITAL_COMMUNITY)
Admission: RE | Admit: 2017-03-22 | Discharge: 2017-03-22 | Disposition: A | Discharge status: Home / Self Care | Payer: Self-pay | Source: Ambulatory Visit | Attending: Obstetrics and Gynecology | Admitting: Obstetrics and Gynecology

## 2017-03-22 VITALS — BP 122/86 | Temp 98.3°F | Ht 68.0 in | Wt 249.4 lb

## 2017-03-22 DIAGNOSIS — Z1231 Encounter for screening mammogram for malignant neoplasm of breast: Secondary | ICD-10-CM

## 2017-03-22 DIAGNOSIS — Z1239 Encounter for other screening for malignant neoplasm of breast: Secondary | ICD-10-CM

## 2017-03-22 MED FILL — LEVETIRACETAM ER 500 MG TAB: 500 | 30 days supply | Qty: 30 | Fill #3

## 2017-03-22 NOTE — Patient Instructions (Signed)
Explained breast self awareness with [name]. Patient did not need a Pap smear today due to her history of a hysterectomy for benign reasons. Let patient know that she doesn't need any further Pap smears due to her history of a hysterectomy for benign reasons. Referred patient to the [facility] for a screening mammogram. Appointment scheduled for Thursday, Mar 22, 2017 at 1240. Let patient know the [facility] will follow up with her within the next couple weeks with results of mammogram by letter or phone. [name] verbalized understanding.  Anshika Pethtel, Arvil [place], RN 1:44 PM

## 2017-03-22 NOTE — Progress Notes (Signed)
No complaints today.   Pap Smear: Pap smear not completed today. Patient unsure when last Pap smear was completed. Per patient has no history of an abnormal Pap smear. Patient has a history of a hysterectomy 12/15/2015 for fibroids. Patient no longer needs Pap smears due to her history of a hysterectomy for benign reasons per BCCCP and ACOG guidelines. No Pap smear results in EPIC. Hysterectomy report is in EPIC.  Physical exam: Breasts Breasts symmetrical. No skin abnormalities bilateral breasts. No nipple retraction bilateral breasts. No nipple discharge bilateral breasts. No lymphadenopathy. No lumps palpated bilateral breasts. No complaints of pain or tenderness on exam. Referred patient to the [facility] for a screening mammogram. Appointment scheduled for Thursday, Mar 22, 2017 at 1240.        Pelvic/Bimanual No Pap smear completed today since patient has a history of a hysterectomy for benign reasons. Pap smear not indicated per BCCCP guidelines.   Smoking History: Patient has never smoked.  Patient Navigation: Patient education provided. Access to services provided for patient through [facility] program.   Colorectal Cancer Screening: Per patient had a colonoscopy completed in 2016. No complaints today. FIT Test given to patient to complete and return to BCCCP.

## 2017-03-23 ENCOUNTER — Ambulatory Visit: Payer: No Typology Code available for payment source | Admitting: Physical Therapy

## 2017-03-27 ENCOUNTER — Encounter (HOSPITAL_COMMUNITY): Payer: Self-pay | Admitting: *Deleted

## 2017-03-27 ENCOUNTER — Telehealth: Payer: Self-pay | Admitting: Neurology

## 2017-03-27 MED FILL — TROKENDI XR 50 MG CAPSULE: 50 | 30 days supply | Qty: 30 | Fill #0

## 2017-03-27 NOTE — Telephone Encounter (Signed)
Jenna/[employer] [phone] REF# [id number] reg PA for trokendi. PA was faxed on 5/18, has it been rec'd?

## 2017-03-27 NOTE — Telephone Encounter (Signed)
Rn all [place] tracks at [phone]. Rn receive authorization number [id number], medication approve from 03/27/2017 to 03/22/2018.

## 2017-03-28 ENCOUNTER — Ambulatory Visit: Payer: Medicaid Other | Admitting: Physical Therapy

## 2017-03-28 ENCOUNTER — Other Ambulatory Visit: Payer: Self-pay

## 2017-03-28 ENCOUNTER — Encounter: Payer: Self-pay | Admitting: Physical Therapy

## 2017-03-28 DIAGNOSIS — R2681 Unsteadiness on feet: Secondary | ICD-10-CM

## 2017-03-28 DIAGNOSIS — M6281 Muscle weakness (generalized): Secondary | ICD-10-CM | POA: Diagnosis not present

## 2017-03-28 DIAGNOSIS — R2689 Other abnormalities of gait and mobility: Secondary | ICD-10-CM

## 2017-03-28 NOTE — Therapy (Signed)
[facility] [address] [place], [place], [address] Phone: [phone]   Fax:  [phone]  Physical Therapy Treatment  Patient Details  Name: Alison Ward MRN: [id number] Date of Birth: 1961-08-23 Referring Provider: Angelica Chessman MD  Encounter Date: 03/28/2017      PT End of Session - 03/28/17 1441    Visit Number 5   Number of Visits 13   Date for PT Re-Evaluation 05/02/17   Authorization Type CAFA Approved 100% through 06/26/17   PT Start Time 7517   PT Stop Time 0017  Pt requested to leave early.   PT Time Calculation (min) 33 min   Equipment Utilized During Treatment Gait belt   Activity Tolerance Patient limited by fatigue  c/o fatigue at end of session   Behavior During Therapy [facility] for tasks assessed/performed      Past Medical History:  Diagnosis Date  . Chronic headaches    daily, bitemporal, associated with tingling in face  . Heart murmur   . Hypertension   . Seizures ([place])    partial seizures last one on 10/22/15  On Keppra currently   . Stroke [facility]) 07/05/2013    Past Surgical History:  Procedure Laterality Date  . ABDOMINAL HYSTERECTOMY N/A 12/15/2015   Procedure: HYSTERECTOMY ABDOMINAL;  Surgeon: Emily Filbert, MD;  Location: [facility];  Service: Gynecology;  Laterality: N/A;  . CESAREAN SECTION    . ceserean section    . SALPINGOOPHORECTOMY Bilateral 12/15/2015   Procedure: SALPINGO OOPHORECTOMY;  Surgeon: Emily Filbert, MD;  Location: [facility] [facility];  Service: Gynecology;  Laterality: Bilateral;  . VENTRICULOSTOMY Left 07/07/2013   Procedure: VENTRICULOSTOMY;  Surgeon: Ophelia Charter, MD;  Location: [place] [facility];  Service: Neurosurgery;  Laterality: Left;  Left Ventriculostomy and Removal of Right Vetriculostomy    There were no vitals filed for this visit.      Subjective Assessment - 03/28/17 1410    Subjective Walked in the morning before therapy and started to have numbness in LLE. Pt  is planning to pick up new medication for LE numbness prescribed by the neurologist.   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     Currently in Pain? Yes   Pain Score 2    Pain Location Leg   Pain Orientation Left   Pain Descriptors / Indicators Numbness;Pressure   Pain Type Chronic pain   Pain Onset More than a month ago   Pain Frequency Intermittent      Performed 03/28/17 as written with min to no cues or UE support.   Single Leg - Eyes Open    Holding support, lift right leg while maintaining balance over other leg. Progress to removing hands from support surface for longer periods of time. Hold__10__ seconds. Repeat with other leg. Repeat _3_ times each leg. Do _1-2_ sessions per day.  Copyright  VHI. All rights reserved.     "I love a Database administrator   At counter for balance as needed: high knee marching forward and then backward. 3 second pauses with each knee lift.  Repeat 3 laps each way. Do _1-2_ sessions per day. http://gt2.exer.us/345   Copyright  VHI. All rights reserved.   Side-Stepping   At counter for balance as needed: Walk  to left side with eyes open. Take even steps, leading with same foot. Make sure each foot lifts off the floor. Repeat in opposite direction. Keep feet pointed toward counter. Repeat for 3 laps each way.  Do _1-2__ sessions per day.  Copyright  VHI. All rights reserved.   Feet Heel-Toe "Tandem"   At counter: Arms at sides, walk a straight line forward bringing one foot directly in front of the other, and then a straight line backwards bringing one foot directly behind the other one.  Repeat for _3 laps each way. Do _1-2_ sessions per day.  Copyright  VHI. All rights  reserved.                    [facility] Adult PT Treatment/Exercise - 03/28/17 0001      Knee/Hip Exercises: Aerobic   Nustep Level 3 x 10 minutes with UE's/LE's, goal >/= 40 steps per minute for strengthening and activity tolerance.                 PT Education - 03/28/17 1419    Education provided Yes   Education Details Performed/ reviewed HEP given 5/22.   Person(s) Educated Patient   Methods Explanation;Demonstration;Handout;Verbal cues   Comprehension Verbalized understanding;Returned demonstration          PT Short Term Goals - 03/05/17 0928      PT SHORT TERM GOAL #1   Title Pt will be independent with HEP to address strength, balance and reduction of pain.  TARGET 04/02/17   Time 4   Period Weeks   Status New     PT SHORT TERM GOAL #2   Title Pt will improve TUG score to <13.5 seconds 3 of 3 trials for decreased fall risk.   Time 4   Period Weeks   Status New     PT SHORT TERM GOAL #3   Title Pt will improve DGI score to at least 17/24 for decreased fall risk.   Time 4   Period Weeks   Status New     PT SHORT TERM GOAL #4   Title Pt will be able to perform at least 10 minutes of standing/walking activities with <3/10 numbness or pain, for improved tolerance and participation in functional activities.   Time 4   Period Weeks   Status New           PT Long Term Goals - 03/05/17 0930      PT LONG TERM GOAL #1   Title Pt will verbalize understanding of fall prevention in home environment.  6 WEEK GOAL TARGET 05/02/17   Time 6   Period Weeks   Status New     PT LONG TERM GOAL #2   Title Pt will improve DGI score to at least 20/24 for decreased fall risk.   Time 6   Period Weeks   Status New     PT LONG TERM GOAL #3   Title Pt will improve gait velocity to at least 3 ft/sec for improved community gait.   Time 6   Period Weeks   Status New     PT LONG TERM GOAL #4   Title Pt will verbalize plans for continued community fitness  upon D/C from PT.   Time 6   Period Weeks   Status New               Plan - 03/28/17 1418    Clinical Impression Statement Pt performed balance HEP given  03/20/17 with little to no UE support for SLS, sidestepping, marching, and tandem. Pt did require multiple seated rest breaks reporting fatigue during session.   Rehab Potential Good   PT Frequency 2x / week   PT Duration 6 weeks   PT Treatment/Interventions ADLs/Self Care Home Management;Functional mobility training;Gait training;Therapeutic activities;Therapeutic exercise;Balance training;Neuromuscular re-education;Patient/family education   PT Next Visit Plan  ASk about new medication; dynamic balance and gait activities, continue to work on strengthening of LE's.       Patient will benefit from skilled therapeutic intervention in order to improve the following deficits and impairments:  Abnormal gait, Decreased activity tolerance, Decreased balance, Decreased mobility, Decreased strength, Difficulty walking, Impaired sensation, Pain, Decreased range of motion  Visit Diagnosis: Muscle weakness (generalized)  Other abnormalities of gait and mobility  Unsteadiness on feet     Problem List Patient Active Problem List   Diagnosis Date Noted  . Chronic tension-type headache, not intractable 12/30/2015  . Post-operative state 12/15/2015  . Simple partial seizure disorder ([place]) 11/04/2015  . Left sided numbness 09/25/2015  . Acute kidney injury ([place]) 09/25/2015  . Leukopenia 09/25/2015  . Tension vascular headache 09/07/2015  . Meralgia paresthetica of left side 09/07/2015  . Fainting spell 09/07/2015  . Prediabetes 09/02/2015  . Stye 08/06/2014  . Essential hypertension, benign 04/16/2014  . CVA (cerebral vascular accident) ([place]) 04/16/2014  . Headache 04/16/2014  . Tension headache 03/06/2014  . Obesity, unspecified 08/13/2013  . 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 ICH (intracerebral  hemorrhage) ([place]) 07/21/2013  . Hypokalemia 07/21/2013  . Intracerebral hemorrhage ([place]) 07/06/2013  . Essential hypertension 02/24/2009    Bjorn Loser, PTA  03/28/17, 3:46 PM [facility] [address] [place], [place], [address] Phone: [phone]   Fax:  [phone]  Name: Alison Ward MRN: [id number] Date of Birth: [birth year]

## 2017-03-28 NOTE — Patient Instructions (Addendum)
Performed 03/28/17 as written with min to no cues or UE support.   Single Leg - Eyes Open    Holding support, lift right leg while maintaining balance over other leg. Progress to removing hands from support surface for longer periods of time. Hold__10__ seconds. Repeat with other leg. Repeat _3_ times each leg. Do _1-2_ sessions per day.  Copyright  VHI. All rights reserved.     "I love a Database administrator   At counter for balance as needed: high knee marching forward and then backward. 3 second pauses with each knee lift.  Repeat 3 laps each way. Do _1-2_ sessions per day. http://gt2.exer.us/345   Copyright  VHI. All rights reserved.   Side-Stepping   At counter for balance as needed: Walk to left side with eyes open. Take even steps, leading with same foot. Make sure each foot lifts off the floor. Repeat in opposite direction. Keep feet pointed toward counter. Repeat for 3 laps each way.  Do _1-2__ sessions per day.  Copyright  VHI. All rights reserved.   Feet Heel-Toe "Tandem"   At counter: Arms at sides, walk a straight line forward bringing one foot directly in front of the other, and then a straight line backwards bringing one foot directly behind the other one.  Repeat for _3 laps each way. Do _1-2_ sessions per day.  Copyright  VHI. All rights reserved.

## 2017-03-30 ENCOUNTER — Ambulatory Visit: Payer: Medicaid Other | Admitting: Physical Therapy

## 2017-04-04 ENCOUNTER — Telehealth: Payer: Self-pay | Admitting: Neurology

## 2017-04-04 MED FILL — ATORVASTATIN 40 MG TABLET: 40 | 30 days supply | Qty: 30 | Fill #2

## 2017-04-04 NOTE — Telephone Encounter (Signed)
Pt said she is taking trokendi at night only. She is having severe HA's. She is wanting to stop taking it. Said she can deal with the numbness in the left leg, left hand and left side of the face as opposed to the HA's. Please call

## 2017-04-04 NOTE — Telephone Encounter (Signed)
I spoke to the patient she informed me that the headaches are not better but numbness was improved on the Trokendi Xr 50 mg daily. I recommend she increase the dose to 100 mg daily. She voiced understanding. She was advised to call back in a week or 2 and keep [place] updated.

## 2017-04-05 LAB — FECAL OCCULT BLOOD, IMMUNOCHEMICAL: Fecal Occult Bld: NEGATIVE

## 2017-04-09 ENCOUNTER — Ambulatory Visit: Payer: Medicaid Other | Attending: Internal Medicine | Admitting: Physical Therapy

## 2017-04-09 DIAGNOSIS — R2681 Unsteadiness on feet: Secondary | ICD-10-CM | POA: Diagnosis present

## 2017-04-09 DIAGNOSIS — R2689 Other abnormalities of gait and mobility: Secondary | ICD-10-CM

## 2017-04-09 NOTE — Therapy (Signed)
[facility] [address] [place], [place], [address] Phone: [phone]   Fax:  [phone]  Physical Therapy Treatment  Patient Details  Name: Alison Ward MRN: [id number] Date of Birth: 06/12/1961 Referring Provider: Angelica Chessman MD  Encounter Date: 04/09/2017      PT End of Session - 04/09/17 1724    Visit Number 6   Number of Visits 13   Date for PT Re-Evaluation 05/02/17   Authorization Type CAFA Approved 100% through 06/26/17   PT Start Time 8101   PT Stop Time 1402   PT Time Calculation (min) 44 min   Equipment Utilized During Treatment Gait belt   Activity Tolerance Patient tolerated treatment well   Behavior During Therapy [facility] for tasks assessed/performed      Past Medical History:  Diagnosis Date  . Chronic headaches    daily, bitemporal, associated with tingling in face  . Heart murmur   . Hypertension   . Seizures ([place])    partial seizures last one on 10/22/15  On Keppra currently   . Stroke [facility]) 07/05/2013    Past Surgical History:  Procedure Laterality Date  . ABDOMINAL HYSTERECTOMY N/A 12/15/2015   Procedure: HYSTERECTOMY ABDOMINAL;  Surgeon: Emily Filbert, MD;  Location: [place] [facility] [facility];  Service: Gynecology;  Laterality: N/A;  . CESAREAN SECTION    . ceserean section    . SALPINGOOPHORECTOMY Bilateral 12/15/2015   Procedure: SALPINGO OOPHORECTOMY;  Surgeon: Emily Filbert, MD;  Location: [place] [facility];  Service: Gynecology;  Laterality: Bilateral;  . VENTRICULOSTOMY Left 07/07/2013   Procedure: VENTRICULOSTOMY;  Surgeon: Ophelia Charter, MD;  Location: [place] [facility];  Service: Neurosurgery;  Laterality: Left;  Left Ventriculostomy and Removal of Right Vetriculostomy    There were no vitals filed for this visit.      Subjective Assessment - 04/09/17 1320    Subjective Reports having significant headaches and unpleasant side effects from Trokendi XR medication.  Her headache was worsening, so she  spoke with neurologist, who wanted her to double the dose.  She did not want to do that, and she has not had any more medications-no headaches, no confusion.   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     Currently in Pain? Yes   Pain Score 3    Pain Location Head   Pain Descriptors / Indicators Aching   Pain Type Chronic pain   Pain Onset More than a month ago   Pain Frequency Intermittent   Aggravating Factors  too much activity brings on   Pain Relieving Factors Gabapentin helps                         [facility] Adult PT Treatment/Exercise - 04/09/17 1335      Transfers   Transfers Sit to Stand;Stand to Sit   Sit to Stand 6: Modified independent (Device/Increase time);Without upper extremity assist;From bed;From chair/3-in-1   Stand to Sit 6: Modified independent (Device/Increase time);Without upper extremity assist;To bed;To chair/3-in-1     Ambulation/Gait   Ambulation/Gait Yes   Ambulation/Gait Assistance 7: Independent   Ambulation Distance (Feet) 120 Feet   Assistive device None   Gait Pattern Step-through pattern   Ambulation Surface Level;Indoor   Gait velocity  10.91 sec = 3 ft/sec     Standardized Balance Assessment   Standardized Balance Assessment Timed Up and Go Test;Dynamic Gait Index     Dynamic Gait Index   Level Surface Mild Impairment   Change in Gait Speed Mild Impairment   Gait with Horizontal Head Turns Moderate Impairment   Gait with Vertical Head Turns Moderate Impairment   Gait and Pivot Turn Mild Impairment   Step Over Obstacle Mild Impairment   Step Around Obstacles Mild Impairment   Steps Normal   Total Score 15     Timed Up and Go Test   TUG Normal TUG   Normal TUG  (seconds) 11.75     High Level Balance   High Level Balance Comments Reviewed HEP-SLS, Marching forward/back, tandem gait, sidestepping-pt performs independently     Self-Care   Self-Care Other Self-Care Comments   Other Self-Care Comments  Discussed pt's new medication issues and pt d/cing medication (her decision) after a few days of use due to significant unwanted side effects.  Advised pt to let neurologist know of her side effects and that she has stopped taking the medication.  Discussed fall prevention education and handout provided.  Discussed progress towards goals and pt would like to discharge this visit due to being pleased with progress.     Neuro Re-ed    Neuro Re-ed Details  With DGI with head turns, pt reports slight increase in headache.  Discussed trying to incorporate head turns into current HEP for additional challenge as well as possible habituation to the movement.  Pt agreeable to trying.                PT Education - 04/09/17 1723    Education provided Yes   Education Details Fall prevention, progress towards goals   Person(s) Educated Patient   Methods Explanation;Handout   Comprehension Verbalized understanding          PT Short Term Goals - 04/09/17 1725      PT SHORT TERM GOAL #1   Title Pt will be independent with HEP to address strength, balance and reduction of pain.  TARGET 04/02/17   Time 4   Period Weeks   Status Achieved     PT SHORT TERM GOAL #2   Title Pt will improve TUG score to <13.5 seconds 3 of 3 trials for decreased fall risk.   Time 4   Period Weeks   Status Achieved     PT SHORT TERM GOAL #3   Title Pt will improve DGI score to at least 17/24 for decreased fall risk.   Time 4   Period Weeks   Status Not Met     PT SHORT TERM GOAL #4   Title Pt will be able to perform at least 10 minutes of standing/walking activities with <3/10 numbness or pain, for improved tolerance and participation in functional activities.   Time 4    Period Weeks   Status Achieved           PT Long Term Goals - 04/09/17 1728      PT LONG TERM GOAL #1   Title Pt will verbalize understanding of fall prevention in home environment.  6 WEEK GOAL TARGET 05/02/17   Time 6   Period Weeks   Status Achieved     PT LONG TERM GOAL #2   Title Pt will improve DGI score to at least 20/24 for decreased fall risk.   Time 6   Period Weeks  Status Not Met     PT LONG TERM GOAL #3   Title Pt will improve gait velocity to at least 3 ft/sec for improved community gait.   Time 6   Period Weeks   Status Achieved     PT LONG TERM GOAL #4   Title Pt will verbalize plans for continued community fitness upon D/C from PT.   Time 6   Period Weeks   Status Achieved               Plan - 04/09/17 1729    Clinical Impression Statement Pt has met STGs 1, 2, 4.  STG 3 not met, with DGI score improved to 15/24.  Pt reports improved confidence with gait and improved tolerance with mobility.  She would like to discharge from therapy this visit.   Rehab Potential Good   PT Frequency 2x / week   PT Duration 6 weeks   PT Treatment/Interventions ADLs/Self Care Home Management;Functional mobility training;Gait training;Therapeutic activities;Therapeutic exercise;Balance training;Neuromuscular re-education;Patient/family education   PT Next Visit Plan Discharge this visit.   Consulted and Agree with Plan of Care Patient      Patient will benefit from skilled therapeutic intervention in order to improve the following deficits and impairments:  Abnormal gait, Decreased activity tolerance, Decreased balance, Decreased mobility, Decreased strength, Difficulty walking, Impaired sensation, Pain, Decreased range of motion  Visit Diagnosis: Other abnormalities of gait and mobility  Unsteadiness on feet     Problem List Patient Active Problem List   Diagnosis Date Noted  . Chronic tension-type headache, not intractable 12/30/2015  .  Post-operative state 12/15/2015  . Simple partial seizure disorder ([place]) 11/04/2015  . Left sided numbness 09/25/2015  . Acute kidney injury ([place]) 09/25/2015  . Leukopenia 09/25/2015  . Tension vascular headache 09/07/2015  . Meralgia paresthetica of left side 09/07/2015  . Fainting spell 09/07/2015  . Prediabetes 09/02/2015  . Stye 08/06/2014  . Essential hypertension, benign 04/16/2014  . CVA (cerebral vascular accident) ([place]) 04/16/2014  . Headache 04/16/2014  . Tension headache 03/06/2014  . Obesity, unspecified 08/13/2013  . 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[place]) 07/22/2013  . ICH (intracerebral hemorrhage) ([place]) 07/21/2013  . Hypokalemia 07/21/2013  . Intracerebral hemorrhage ([place]) 07/06/2013  . Essential hypertension 02/24/2009    MARRIOTT,AMY W. 04/09/2017, 5:31 PM  Frazier Butt., PT   [place] [address] [place], [place], [address] Phone: [phone]   Fax:  [phone]  Name: ZAKIYYAH SAVANNAH MRN: [id number] Date of Birth: [birth year]   PHYSICAL THERAPY DISCHARGE SUMMARY  Visits from Start of Care: 6  Current functional level related to goals / functional outcomes:     PT Short Term Goals - 04/09/17 1725      PT SHORT TERM GOAL #1   Title Pt will be independent with HEP to address strength, balance and reduction of pain.  TARGET 04/02/17   Time 4   Period Weeks   Status Achieved     PT SHORT TERM GOAL #2   Title Pt will improve TUG score to <13.5 seconds 3 of 3 trials for decreased fall risk.   Time 4   Period Weeks   Status Achieved     PT SHORT TERM GOAL #3   Title Pt will improve DGI score to at least 17/24 for decreased fall risk.   Time 4   Period Weeks   Status Not Met     PT SHORT TERM GOAL #4  Title Pt will be able to perform at least 10 minutes of standing/walking activities with <3/10 numbness or pain, for improved tolerance and participation in functional  activities.   Time 4   Period Weeks   Status Achieved         PT Long Term Goals - 04/09/17 1728      PT LONG TERM GOAL #1   Title Pt will verbalize understanding of fall prevention in home environment.  6 WEEK GOAL TARGET 05/02/17   Time 6   Period Weeks   Status Achieved     PT LONG TERM GOAL #2   Title Pt will improve DGI score to at least 20/24 for decreased fall risk.   Time 6   Period Weeks   Status Not Met     PT LONG TERM GOAL #3   Title Pt will improve gait velocity to at least 3 ft/sec for improved community gait.   Time 6   Period Weeks   Status Achieved     PT LONG TERM GOAL #4   Title Pt will verbalize plans for continued community fitness upon D/C from PT.   Time 6   Period Weeks   Status Achieved     Long term goals not fully met, due to pt feeling pleased with her current functional level, desiring to discharge this visit.   Remaining deficits: Headache, L sided numbness-pt feels she can self-manage symptoms by adjusting activity level   Education / Equipment: Educated in [employer], fall prevention and discussed how to continue to progress exercises and use fitness equipment in the community (at [facility]-pt verbalized plans to use bike or seated stepper)  Plan: Patient agrees to discharge.  Patient goals were partially met. Patient is being discharged due to being pleased with the current functional level.  ?????       Mady Haagensen, PT 04/09/17 5:34 PM Phone: [phone] Fax: [phone]

## 2017-04-09 NOTE — Patient Instructions (Signed)

## 2017-04-10 NOTE — Telephone Encounter (Signed)
Left vm for patient that Dr.Sethi will be sent a message that she stop taking the trokendi because of suicidal thoughts. Pts dosage was  Increase by Dr. Leonie Man because of increase headaches. Rn left vm for patient to call back in a week for status of how she is feeling off he medication.

## 2017-04-10 NOTE — Telephone Encounter (Signed)
Pt said she was having suicidal thoughts since being on the Trokendi. The thoughts got worse after doubling the medication- 1 dose. She has not taken it since last Friday, does not want to take this medication. Please call.

## 2017-04-18 ENCOUNTER — Encounter (HOSPITAL_COMMUNITY): Payer: Self-pay | Admitting: *Deleted

## 2017-04-18 NOTE — Progress Notes (Signed)
Letter mailed to patient with negative Fit Test results.  

## 2017-04-20 MED FILL — LEVETIRACETAM ER 500 MG TAB: 500 | 30 days supply | Qty: 30 | Fill #4

## 2017-04-20 MED FILL — GABAPENTIN 300 MG CAPSULE: 300 | 30 days supply | Qty: 90 | Fill #3

## 2017-04-30 ENCOUNTER — Other Ambulatory Visit: Payer: Self-pay | Admitting: *Deleted

## 2017-04-30 MED ORDER — TOPIRAMATE ER 50 MG PO CAP24: 1.0000 | ORAL_CAPSULE | Freq: Every day | ORAL | 3 refills | Status: DC

## 2017-04-30 NOTE — Telephone Encounter (Signed)
PRINTED FOR PASS PROGRAM 

## 2017-05-08 MED FILL — ATORVASTATIN 40 MG TABLET: 40 | 30 days supply | Qty: 30 | Fill #3

## 2017-05-16 ENCOUNTER — Encounter: Payer: Self-pay | Admitting: Internal Medicine

## 2017-05-16 ENCOUNTER — Ambulatory Visit: Payer: Medicaid Other | Attending: Internal Medicine | Admitting: Internal Medicine

## 2017-05-16 VITALS — BP 122/81 | HR 76 | Temp 98.0°F | Resp 19 | Ht 68.0 in | Wt 248.6 lb

## 2017-05-16 DIAGNOSIS — R200 Anesthesia of skin: Secondary | ICD-10-CM | POA: Diagnosis not present

## 2017-05-16 DIAGNOSIS — G44229 Chronic tension-type headache, not intractable: Secondary | ICD-10-CM | POA: Insufficient documentation

## 2017-05-16 DIAGNOSIS — R569 Unspecified convulsions: Secondary | ICD-10-CM | POA: Insufficient documentation

## 2017-05-16 DIAGNOSIS — Z8673 Personal history of transient ischemic attack (TIA), and cerebral infarction without residual deficits: Secondary | ICD-10-CM | POA: Insufficient documentation

## 2017-05-16 DIAGNOSIS — Z7982 Long term (current) use of aspirin: Secondary | ICD-10-CM | POA: Diagnosis not present

## 2017-05-16 DIAGNOSIS — Z09 Encounter for follow-up examination after completed treatment for conditions other than malignant neoplasm: Secondary | ICD-10-CM | POA: Diagnosis present

## 2017-05-16 DIAGNOSIS — I10 Essential (primary) hypertension: Secondary | ICD-10-CM

## 2017-05-16 MED ORDER — GABAPENTIN 300 MG PO CAPS: 300.0000 mg | ORAL_CAPSULE | Freq: Three times a day (TID) | ORAL | 3 refills | Status: DC

## 2017-05-16 NOTE — Progress Notes (Signed)
Patient has eaten  Patient has taken her medications

## 2017-05-16 NOTE — Progress Notes (Signed)
Alison Ward, is a 56 y.o. female  TKW:[id number]  JME:[id number]  DOB - February 08, 1961  Chief Complaint  Patient presents with  . Follow-up      Subjective:   Alison Ward is a 56 y.o. 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 Patient claims she is doing very well these days, her Gabapentin was increased to 300 mg 3x/day instead of 2x/day. Topamax gave her suicidal thought and she had to stop taking it. She still has ongoing numbness on her left upper limb. She denies any suicidal thoughts at this time. Patient has No headache, No chest pain, No abdominal pain - No Nausea, No new weakness tingling or numbness, No Cough - SOB.  ALLERGIES: Allergies  Allergen Reactions  . Elavil [Amitriptyline] Other (See Comments)    DIZZINESS  . Topiramate Er Other (See Comments)    Suicidal Thoughts    PAST MEDICAL HISTORY: Past Medical History:  Diagnosis Date  . Chronic headaches    daily, bitemporal, associated with tingling in face  . Heart murmur   . Hypertension   . 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 Stroke [facility]) 07/05/2013    MEDICATIONS AT HOME: Prior to Admission medications   Medication Sig Start Date End Date Taking? Authorizing Provider  acetaminophen (TYLENOL) 500 MG tablet Take 500 mg by mouth every 6 (six) hours as needed.    [provider]  aspirin EC 81 MG tablet Take 1 tablet (81 mg total) by mouth daily. 01/15/17   Tresa Garter, MD  atorvastatin (LIPITOR) 40 MG tablet Take 1 tablet (40 mg total) by mouth daily. 12/08/16   Tresa Garter, MD  fluticasone (FLONASE) 50 MCG/ACT nasal spray Place 2 sprays into both nostrils daily. Patient not taking: Reported on 03/22/2017 09/29/15   Arnoldo Morale, MD  gabapentin (NEURONTIN) 300 MG capsule Take 1 capsule  (300 mg total) by mouth 3 (three) times daily. 05/16/17   Tresa Garter, MD  levETIRAcetam (KEPPRA XR) 500 MG 24 hr tablet Take 1 tablet (500 mg total) by mouth daily. 12/06/16   Tresa Garter, MD  lisinopril-hydrochlorothiazide (PRINZIDE,ZESTORETIC) 20-25 MG tablet Take 1 tablet by mouth daily. 12/06/16   Tresa Garter, MD    Objective:   Vitals:   05/16/17 0919  BP: 122/81  Pulse: 76  Resp: 19  Temp: 98 F (36.7 C)  TempSrc: Oral  SpO2: 95%  Weight: 248 lb 9.6 oz (112.8 kg)  Height: 5\' 8"  (1.727 m)   Exam General appearance : Awake, alert, not in any distress. Speech Clear. Not toxic looking, obese HEENT: Atraumatic and Normocephalic, pupils equally reactive to light and accomodation Neck: Supple, no JVD. No cervical lymphadenopathy.  Chest: Good air entry bilaterally, no added sounds  CVS: S1 S2 regular, no murmurs.  Abdomen: Bowel sounds present, Non tender and not distended with no gaurding, rigidity or rebound. Extremities: B/L Lower Ext shows no edema, both legs are warm to touch Neurology: Awake alert, and oriented X 3, CN II-XII intact, Non focal Skin: No Rash  Data Review Lab Results  Component Value Date   HGBA1C 5.7 02/07/2017   HGBA1C 5.7 12/30/2015   HGBA1C 5.80 07/08/2015    Assessment & Plan   1. Essential hypertension, benign  We have discussed target BP range and blood pressure goal.  I have advised patient to check BP regularly and to call us back or report to clinic if the numbers are consistently higher than 140/90. We discussed the importance of compliance with medical therapy and DASH diet recommended, consequences of uncontrolled hypertension discussed.  - continue current BP medications  2. Chronic tension-type headache, not intractable  - Discontinue Topiramate  3. Left sided numbness  - gabapentin (NEURONTIN) 300 MG capsule; Take 1 capsule (300 mg total) by mouth 3 (three) times daily.  Dispense: 270 capsule; Refill:  3  Patient have been counseled extensively about nutrition and exercise. Other issues discussed during this visit include: low cholesterol diet, weight control and daily exercise, foot care, annual eye examinations at Ophthalmology, importance of adherence with medications and regular follow-up. We also discussed long term complications of uncontrolled hypertension.   Return in about 6 months (around 11/16/2017) for Follow up HTN.  The patient was given clear instructions to go to ER or return to medical center if symptoms don't improve, worsen or new problems develop. The patient verbalized understanding. The patient was told to call to get lab results if they haven't heard anything in the next week.   This note has been created with Surveyor, quantity. Any transcriptional errors are unintentional.    Angelica Chessman, MD, [place], Karilyn [place], [place] and [facility] [place], [place]   05/16/2017, 10:07 AM

## 2017-05-16 NOTE — Patient Instructions (Signed)
DASH Eating Plan DASH stands for "Dietary Approaches to Stop Hypertension." The DASH eating plan is a healthy eating plan that has been shown to reduce high blood pressure (hypertension). It may also reduce your risk for type 2 diabetes, heart disease, and stroke. The DASH eating plan may also help with weight loss. What are tips for following this plan? General guidelines  Avoid eating more than 2,300 mg (milligrams) of salt (sodium) a day. If you have hypertension, you may need to reduce your sodium intake to 1,500 mg a day.  Limit alcohol intake to no more than 1 drink a day for nonpregnant women and 2 drinks a day for men. One drink equals 12 oz of beer, 5 oz of wine, or 1 oz of hard liquor.  Work with your health care provider to maintain a healthy body weight or to lose weight. Ask what an ideal weight is for you.  Get at least 30 minutes of exercise that causes your heart to beat faster (aerobic exercise) most days of the week. Activities may include walking, swimming, or biking.  Work with your health care provider or diet and nutrition specialist (dietitian) to adjust your eating plan to your individual calorie needs. Reading food labels  Check food labels for the amount of sodium per serving. Choose foods with less than 5 percent of the Daily Value of sodium. Generally, foods with less than 300 mg of sodium per serving fit into this eating plan.  To find whole grains, look for the word "whole" as the first word in the ingredient list. Shopping  Buy products labeled as "low-sodium" or "no salt added."  Buy fresh foods. Avoid canned foods and premade or frozen meals. Cooking  Avoid adding salt when cooking. Use salt-free seasonings or herbs instead of table salt or sea salt. Check with your health care provider or pharmacist before using salt substitutes.  Do not fry foods. Cook foods using healthy methods such as baking, boiling, grilling, and broiling instead.  Cook with  heart-healthy oils, such as olive, canola, soybean, or sunflower oil. Meal planning   Eat a balanced diet that includes: ? 5 or more servings of fruits and vegetables each day. At each meal, try to fill half of your plate with fruits and vegetables. ? Up to 6-8 servings of whole grains each day. ? Less than 6 oz of lean meat, poultry, or fish each day. A 3-oz serving of meat is about the same size as a deck of cards. One egg equals 1 oz. ? 2 servings of low-fat dairy each day. ? A serving of nuts, seeds, or beans 5 times each week. ? Heart-healthy fats. Healthy fats called Omega-3 fatty acids are found in foods such as flaxseeds and coldwater fish, like sardines, salmon, and mackerel.  Limit how much you eat of the following: ? Canned or prepackaged foods. ? Food that is high in trans fat, such as fried foods. ? Food that is high in saturated fat, such as fatty meat. ? Sweets, desserts, sugary drinks, and other foods with added sugar. ? Full-fat dairy products.  Do not salt foods before eating.  Try to eat at least 2 vegetarian meals each week.  Eat more home-cooked food and less restaurant, buffet, and fast food.  When eating at a restaurant, ask that your food be prepared with less salt or no salt, if possible. What foods are recommended? The items listed may not be a complete list. Talk with your dietitian about what   dietary choices are best for you. Grains Whole-grain or whole-wheat bread. Whole-grain or whole-wheat pasta. Brown rice. Oatmeal. Quinoa. Bulgur. Whole-grain and low-sodium cereals. Pita bread. Low-fat, low-sodium crackers. Whole-wheat flour tortillas. Vegetables Fresh or frozen vegetables (raw, steamed, roasted, or grilled). Low-sodium or reduced-sodium tomato and vegetable juice. Low-sodium or reduced-sodium tomato sauce and tomato paste. Low-sodium or reduced-sodium canned vegetables. Fruits All fresh, dried, or frozen fruit. Canned fruit in natural juice (without  added sugar). Meat and other protein foods Skinless chicken or turkey. Ground chicken or turkey. Pork with fat trimmed off. Fish and seafood. Egg whites. Dried beans, peas, or lentils. Unsalted nuts, nut butters, and seeds. Unsalted canned beans. Lean cuts of beef with fat trimmed off. Low-sodium, lean deli meat. Dairy Low-fat (1%) or fat-free (skim) milk. Fat-free, low-fat, or reduced-fat cheeses. Nonfat, low-sodium ricotta or cottage cheese. Low-fat or nonfat yogurt. Low-fat, low-sodium cheese. Fats and oils Soft margarine without trans fats. Vegetable oil. Low-fat, reduced-fat, or light mayonnaise and salad dressings (reduced-sodium). Canola, safflower, olive, soybean, and sunflower oils. Avocado. Seasoning and other foods Herbs. Spices. Seasoning mixes without salt. Unsalted popcorn and pretzels. Fat-free sweets. What foods are not recommended? The items listed may not be a complete list. Talk with your dietitian about what dietary choices are best for you. Grains Baked goods made with fat, such as croissants, muffins, or some breads. Dry pasta or rice meal packs. Vegetables Creamed or fried vegetables. Vegetables in a cheese sauce. Regular canned vegetables (not low-sodium or reduced-sodium). Regular canned tomato sauce and paste (not low-sodium or reduced-sodium). Regular tomato and vegetable juice (not low-sodium or reduced-sodium). Pickles. Olives. Fruits Canned fruit in a light or heavy syrup. Fried fruit. Fruit in cream or butter sauce. Meat and other protein foods Fatty cuts of meat. Ribs. Fried meat. Bacon. Sausage. Bologna and other processed lunch meats. Salami. Fatback. Hotdogs. Bratwurst. Salted nuts and seeds. Canned beans with added salt. Canned or smoked fish. Whole eggs or egg yolks. Chicken or turkey with skin. Dairy Whole or 2% milk, cream, and half-and-half. Whole or full-fat cream cheese. Whole-fat or sweetened yogurt. Full-fat cheese. Nondairy creamers. Whipped toppings.  Processed cheese and cheese spreads. Fats and oils Butter. Stick margarine. Lard. Shortening. Ghee. Bacon fat. Tropical oils, such as coconut, palm kernel, or palm oil. Seasoning and other foods Salted popcorn and pretzels. Onion salt, garlic salt, seasoned salt, table salt, and sea salt. Worcestershire sauce. Tartar sauce. Barbecue sauce. Teriyaki sauce. Soy sauce, including reduced-sodium. Steak sauce. Canned and packaged gravies. Fish sauce. Oyster sauce. Cocktail sauce. Horseradish that you find on the shelf. Ketchup. Mustard. Meat flavorings and tenderizers. Bouillon cubes. Hot sauce and Tabasco sauce. Premade or packaged marinades. Premade or packaged taco seasonings. Relishes. Regular salad dressings. Where to find more information:  National Heart, Lung, and [employer] Institute: www.nhlbi.nih.gov  American Heart Association: www.heart.org Summary  The DASH eating plan is a healthy eating plan that has been shown to reduce high blood pressure (hypertension). It may also reduce your risk for type 2 diabetes, heart disease, and stroke.  With the DASH eating plan, you should limit salt (sodium) intake to 2,300 mg a day. If you have hypertension, you may need to reduce your sodium intake to 1,500 mg a day.  When on the DASH eating plan, aim to eat more fresh fruits and vegetables, whole grains, lean proteins, low-fat dairy, and heart-healthy fats.  Work with your health care provider or diet and nutrition specialist (dietitian) to adjust your eating plan to your individual   calorie needs. This information is not intended to replace advice given to you by your health care provider. Make sure you discuss any questions you have with your health care provider. Document Released: 10/05/2011 Document Revised: 10/09/2016 Document Reviewed: 10/09/2016 Elsevier Interactive Patient Education  2017 [employer] Hypertension Hypertension is another name for high blood pressure. High blood pressure forces  your heart to work harder to pump blood. This can cause problems over time. There are two numbers in a blood pressure reading. There is a top number (systolic) over a bottom number (diastolic). It is best to have a blood pressure below 120/80. Healthy choices can help lower your blood pressure. You may need medicine to help lower your blood pressure if:  Your blood pressure cannot be lowered with healthy choices.  Your blood pressure is higher than 130/80.  Follow these instructions at home: Eating and drinking  If directed, follow the DASH eating plan. This diet includes: ? Filling half of your plate at each meal with fruits and vegetables. ? Filling one quarter of your plate at each meal with whole grains. Whole grains include whole wheat pasta, brown rice, and whole grain bread. ? Eating or drinking low-fat dairy products, such as skim milk or low-fat yogurt. ? Filling one quarter of your plate at each meal with low-fat (lean) proteins. Low-fat proteins include fish, skinless chicken, eggs, beans, and tofu. ? Avoiding fatty meat, cured and processed meat, or chicken with skin. ? Avoiding premade or processed food.  Eat less than 1,500 mg of salt (sodium) a day.  Limit alcohol use to no more than 1 drink a day for nonpregnant women and 2 drinks a day for men. One drink equals 12 oz of beer, 5 oz of wine, or 1 oz of hard liquor. Lifestyle  Work with your doctor to stay at a healthy weight or to lose weight. Ask your doctor what the best weight is for you.  Get at least 30 minutes of exercise that causes your heart to beat faster (aerobic exercise) most days of the week. This may include walking, swimming, or biking.  Get at least 30 minutes of exercise that strengthens your muscles (resistance exercise) at least 3 days a week. This may include lifting weights or pilates.  Do not use any products that contain nicotine or tobacco. This includes cigarettes and e-cigarettes. If you need  help quitting, ask your doctor.  Check your blood pressure at home as told by your doctor.  Keep all follow-up visits as told by your doctor. This is important. Medicines  Take over-the-counter and prescription medicines only as told by your doctor. Follow directions carefully.  Do not skip doses of blood pressure medicine. The medicine does not work as well if you skip doses. Skipping doses also puts you at risk for problems.  Ask your doctor about side effects or reactions to medicines that you should watch for. Contact a doctor if:  You think you are having a reaction to the medicine you are taking.  You have headaches that keep coming back (recurring).  You feel dizzy.  You have swelling in your ankles.  You have trouble with your vision. Get help right away if:  You get a very bad headache.  You start to feel confused.  You feel weak or numb.  You feel faint.  You get very bad pain in your: ? Chest. ? Belly (abdomen).  You throw up (vomit) more than once.  You have trouble breathing. Summary  Summary  Hypertension is another name for high blood pressure.  Making healthy choices can help lower blood pressure. If your blood pressure cannot be controlled with healthy choices, you may need to take medicine. This information is not intended to replace advice given to you by your health care provider. Make sure you discuss any questions you have with your health care provider. Document Released: 04/03/2008 Document Revised: 09/13/2016 Document Reviewed: 09/13/2016 Else[employer] Interactive Patient Education  2018 [employer]  

## 2017-05-22 ENCOUNTER — Encounter: Payer: Self-pay | Admitting: Nurse Practitioner

## 2017-05-22 ENCOUNTER — Ambulatory Visit (INDEPENDENT_AMBULATORY_CARE_PROVIDER_SITE_OTHER): Payer: Medicaid Other | Admitting: Nurse Practitioner

## 2017-05-22 VITALS — BP 113/77 | HR 80 | Wt 249.0 lb

## 2017-05-22 DIAGNOSIS — I6389 Other cerebral infarction: Secondary | ICD-10-CM

## 2017-05-22 DIAGNOSIS — G5712 Meralgia paresthetica, left lower limb: Secondary | ICD-10-CM | POA: Diagnosis not present

## 2017-05-22 DIAGNOSIS — G44229 Chronic tension-type headache, not intractable: Secondary | ICD-10-CM | POA: Diagnosis not present

## 2017-05-22 DIAGNOSIS — G40109 Localization-related (focal) (partial) symptomatic epilepsy and epileptic syndromes with simple partial seizures, not intractable, without status epilepticus: Secondary | ICD-10-CM

## 2017-05-22 DIAGNOSIS — I638 Other cerebral infarction: Secondary | ICD-10-CM | POA: Diagnosis not present

## 2017-05-22 NOTE — Patient Instructions (Signed)
Continue Keppra at the current dose does not need refills Continue gabapentin 300 mg 3 times daily Continue home exercise program Follow-up in 6 months

## 2017-05-22 NOTE — Progress Notes (Signed)
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 She states she was sitting on a stool when she fainted and fell off and hit the back of her head. She did not lose consciousness for long and did not have any focal symptoms. EMS were called but patient chose not to go to the hospital and seek any help. Since then she's been having headaches which were initially mild and responded to Tylenol and ibuprofen but for the last couple of months the headaches are now occurring on a daily basis. She describes the headaches as variable from mild dull headaches to severe throbbing headaches which vary in severity from 2/10-7/10.Headache is not accompanied by nausea, vomiting or photophobia but she is bothered by loud sounds. Headaches do increase with physical activity and stress and are relieved by sleep and rest. She has been taking ibuprofen 800 mg once or twice a day with only partial relief. She has recently been given a prescription of Tylenol with Codeine but she has not started taking it yet. Patient denies any visual symptoms or focal neurological symptoms accompanying her headaches. She has not had any recent brain imaging study. She denies any neck pain and raducular pain but does complain of mild tightness of the neck and shoulder muscles. 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 She required ventriculostomy at that time. She did recover from that quite well but did have headaches  which lasted for 6 months to year and then gradually  the headaches disappeared and she did quite well for nearly a year without any headaches. She was tried on a variety of headache medications at that time including Depakote, Topamax, amitriptyline but these did not work or she had side effects. Tizanidine probably helped her the most. She also has a new complaint now of left leg numbness which she describes as tingling sensation involving the anterior and lateral   aspect of her left thigh. This also began since her fall. She has seen her primary physician who has prescribed gabapentin which she takes 300mg  twice daily but it makes her dizzy and does not particularly help her numbness. She denies any severe back pain or radicular pain or trouble with walking, balance or bladder control. She has not had any back x-rays or MRI. Update 1/5/2017PS : She returns for follow-up after last visit 2 months ago. She had noticed improvement in her headaches as well as left thigh paresthesias though she still has numbness is she does not get burning sensation as often. She is on Neurontin 300mg  three times daily. She is not able to afford Lyrica due to cost. She had MRI scan lumbar spine done on 09/29/15 which I personally reviewed showed mild spinal stenosis at L4-5 with without definite pinched nerve. Incidental cystic lesion was noted on the right side in the pelvis. 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 Patient was seen in the emergency room on that day with severe abdominal pain. She was seen by gynecologist  and has abdominal hysterectomy and bilateral oophorectomy planned for February 2017. She also had episode of near-syncope on 10/22/2015. Her grandson was with her and called for help as apparently the patient was unresponsive to him and did not answer questions. Inquiry she admits to 3 somewhat similar  episodes over the last 4 months since October. She feels like she is about to pass out but most of the time she consented down. On one occasion she fell on the floor. She may briefly lose consciousness but does not appear to be confused and dazed. She had EEG done on 10/05/15 which I have reviewed was normal. She has no documented history of seizures. Update 4/24/2018PS : She returns for follow-up after last visit more than a year ago. She is accompanied by her sister and her daughter. He states she's had no further episodes of altered consciousness or seizure-like activity ever since she's been taking Keppra which is tolerating well without side effects. She states her tension headaches are improved on the gabapentin she does complain of some dizziness and imbalance in the morning and takes the morning dose take several hours before her balance is good. She is often bump into objects in the morning. She states her thigh paresthesias with a moped K went for a trip to the [place]. Since December of last year she noticed intermittent increasing numbness in the left leg and at times even relate radiata into involving the left arm occasionally the left face as well. She has not had any recent. She saw a chiropractor recently and after manipulation of her back and neck resulted in worsening headache and paresthesias. She has seen her primary physician is referral to physical therapy but it has not yet been started UPDATE 07/24/2018CM Ms. Connell, 56 year old female returns for follow-up with a history of right temporal  parenchymal intracerebral hemorrhage,September 2014, seizure disorder,left-sided numbness. She is currently on Keppra for seizure and no seizure since 2016. She is on Neurontin 300 mg 3 times daily for her headaches which are controlled. She was placed on  trokendi ,at her last visit with Dr. Leonie Man for complaints of left-sided numbness. She took the medication for 3 days and was having suicidal thoughts  and stop the medication. She feels the numbness is stable and does not want to try another medication at this time. She returns for reevaluation  REVIEW OF SYSTEMS: Full 14 system review of systems performed and notable only for those listed, all others are neg:  Constitutional: neg  Cardiovascular: neg Ear/Nose/Throat: neg  Skin: neg Eyes: neg Respiratory: neg Gastroitestinal: neg  Hematology/Lymphatic: neg  Endocrine: neg Musculoskeletal:neg Allergy/Immunology: neg Neurological: headache, seizure disorder, left-sided numbness Psychiatric: neg Sleep : neg   ALLERGIES: Allergies  Allergen Reactions  . Elavil [Amitriptyline] Other (See Comments)    DIZZINESS  . Topiramate Er Other (See Comments)    Suicidal Thoughts- Trokendi    HOME MEDICATIONS: Outpatient Medications Prior to Visit  Medication Sig Dispense Refill  . acetaminophen (TYLENOL) 500 MG tablet Take 500 mg by mouth every 6 (six) hours as needed.    Marland Kitchen aspirin EC 81 MG tablet Take 1 tablet (81 mg total) by mouth daily. 30 tablet 1  . atorvastatin (LIPITOR) 40 MG tablet Take 1 tablet (40 mg total) by mouth daily. 90 tablet 3  . fluticasone (FLONASE) 50 MCG/ACT nasal spray Place 2 sprays into both nostrils daily. 16 g 1  . gabapentin (NEURONTIN) 300 MG capsule Take 1 capsule (300 mg total) by mouth 3 (  three) times daily. 270 capsule 3  . levETIRAcetam (KEPPRA XR) 500 MG 24 hr tablet Take 1 tablet (500 mg total) by mouth daily. 90 tablet 3  . lisinopril-hydrochlorothiazide (PRINZIDE,ZESTORETIC) 20-25 MG tablet Take 1 tablet by mouth daily. 90 tablet 3   No facility-administered medications prior to visit.     PAST MEDICAL HISTORY: Past Medical History:  Diagnosis Date  . Chronic headaches    daily, bitemporal, associated with tingling in face  . Heart murmur   . Hypertension   . Seizures ([place])    partial seizures last one on 10/22/15  On Keppra currently   . Stroke [facility]) 07/05/2013    PAST SURGICAL  HISTORY: Past Surgical History:  Procedure Laterality Date  . ABDOMINAL HYSTERECTOMY N/A 12/15/2015   Procedure: HYSTERECTOMY ABDOMINAL;  Surgeon: Emily Filbert, MD;  Location: [facility];  Service: Gynecology;  Laterality: N/A;  . CESAREAN SECTION    . ceserean section    . SALPINGOOPHORECTOMY Bilateral 12/15/2015   Procedure: SALPINGO OOPHORECTOMY;  Surgeon: Emily Filbert, MD;  Location: [facility];  Service: Gynecology;  Laterality: Bilateral;  . VENTRICULOSTOMY Left 07/07/2013   Procedure: VENTRICULOSTOMY;  Surgeon: Ophelia Charter, MD;  Location: [facility];  Service: Neurosurgery;  Laterality: Left;  Left Ventriculostomy and Removal of Right Vetriculostomy    FAMILY HISTORY: Family History  Problem Relation Age of Onset  . Hypertension Mother   . Diabetes Mother   . Hypertension Father   . Colon cancer Neg Hx   . Breast cancer Neg Hx     SOCIAL HISTORY: Social History   Social History  . Marital status: Divorced    Spouse name: N/A  . Number of children: 3  . Years of education: college   Occupational History  . [employer]    Social History Main Topics  . Smoking status: Never Smoker  . Smokeless tobacco: Never Used  . Alcohol use No  . Drug use: No  . Sexual activity: Not on file   Other Topics Concern  . Not on file   Social History Narrative  . No narrative on file     PHYSICAL EXAM  Vitals:   05/22/17 1348  BP: 113/77  Pulse: 80  Weight: 249 lb (112.9 kg)   Body mass index is 37.86 kg/m.  Generalized: Well developed, obese female in no acute distress  Head: normocephalic and atraumatic,. Oropharynx benign  Neck: Supple, no carotid bruits  Cardiac: Regular rate rhythm, no murmur  Musculoskeletal: No deformity   Neurological examination   Mentation: Alert oriented to time, place, history taking. Attention span and concentration appropriate. Recent and remote memory intact.  Follows all commands speech and language fluent.   Cranial nerve  II-XII: Pupils were equal round reactive to light extraocular movements were full, visual field were full on confrontational test. Facial sensation and strength were normal. hearing was intact to finger rubbing bilaterally. Uvula tongue midline. head turning and shoulder shrug were normal and symmetric.Tongue protrusion into cheek strength was normal. Motor: normal bulk and tone, full strength in the BUE, BLE, fine finger movements normal, outstretch tremor on the left, no resting tremor Sensory: normal and symmetric to light touch, pinprick, and  Vibration,except decrease touch and pinprick in the left thigh on the anterior lateral aspect of the knee  Coordination: finger-nose-finger, heel-to-shin bilaterally, no dysmetria Reflexes:symmetric upper and lower, plantar responses were flexor bilaterally. Gait and Station: Rising up from seated position without assistance, normal stance,  moderate stride,  good arm swing, smooth turning, able to perform tiptoe, and heel walking without difficulty. Tandem gait is steady. No assistive device.  DIAGNOSTIC DATA (LABS, IMAGING, TESTING) - I reviewed patient records, labs, notes, testing and imaging myself where available.  Lab Results  Component Value Date   WBC 4.0 12/06/2016   HGB 12.8 12/06/2016   HCT 39.7 12/06/2016   MCV 86.5 12/06/2016   PLT 267 12/06/2016      Component Value Date/Time   NA 141 12/06/2016 1012   K 4.1 12/06/2016 1012   CL 101 12/06/2016 1012   CO2 30 12/06/2016 1012   GLUCOSE 84 12/06/2016 1012   BUN 13 12/06/2016 1012   CREATININE 1.02 12/06/2016 1012   CALCIUM 9.8 12/06/2016 1012   PROT 7.9 12/06/2016 1012   ALBUMIN 4.2 12/06/2016 1012   AST 18 12/06/2016 1012   ALT 9 12/06/2016 1012   ALKPHOS 51 12/06/2016 1012   BILITOT 0.9 12/06/2016 1012   GFRNONAA 61 12/06/2016 1012   GFRAA 70 12/06/2016 1012   Lab Results  Component Value Date   CHOL 245 (H) 12/06/2016   HDL 38 (L) 12/06/2016   LDLCALC 172 (H) 12/06/2016    TRIG 177 (H) 12/06/2016   CHOLHDL 6.4 (H) 12/06/2016   Lab Results  Component Value Date   HGBA1C 5.7 02/07/2017    ASSESSMENT AND PLAN 55 year old Latin American lady with 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 Left thigh and body paresthesias of unclear etiology. Remote history of hypertensive right temporal intracerebral hemorrhage in 2014. 3 episodes of brief altered consciousness  In 2016 possibly complex partial seizures, none since.She had suicidal thoughts on TROKENDI. The patient is a current patient of Dr. Leonie Man  who is out of the office today . This note is sent to the work in doctor.     Continue Keppra at the current dose does not need refills Continue gabapentin 300 mg 3 times daily Continue home exercise program Follow-up in 6 months Dennie Bible, [facility], [facility], APRN  [facility] [address] [place], [place] [address] [phone]

## 2017-05-23 MED FILL — LEVETIRACETAM ER 500 MG TAB: 500 | 30 days supply | Qty: 30 | Fill #5

## 2017-05-28 MED FILL — GABAPENTIN 300 MG CAPSULE: 300 | 30 days supply | Qty: 90 | Fill #4

## 2017-05-28 NOTE — Progress Notes (Signed)
I have read the note, and I agree with the clinical assessment and plan.  Vidya Bamford A. Merdis Snodgrass, MD, PhD, FAAN Certified in Neurology, Clinical Neurophysiology, Sleep Medicine, Pain Medicine and Neuroimaging  [facility] [address], [address] [place], [place] [address] [phone]  

## 2017-06-04 MED FILL — LISINOPRIL-HCTZ 20-25 MG TA: 20-25 | 30 days supply | Qty: 30 | Fill #2

## 2017-06-04 MED FILL — ATORVASTATIN 40 MG TABLET: 40 | 30 days supply | Qty: 30 | Fill #4

## 2017-06-21 MED FILL — LEVETIRACETAM ER 500 MG TAB: 500 | 30 days supply | Qty: 30 | Fill #6

## 2017-07-04 ENCOUNTER — Ambulatory Visit: Payer: Medicaid Other | Attending: Internal Medicine | Admitting: Internal Medicine

## 2017-07-04 ENCOUNTER — Encounter: Payer: Self-pay | Admitting: Internal Medicine

## 2017-07-04 VITALS — BP 110/75 | HR 74 | Temp 98.8°F | Resp 16 | Wt 249.6 lb

## 2017-07-04 DIAGNOSIS — Z8673 Personal history of transient ischemic attack (TIA), and cerebral infarction without residual deficits: Secondary | ICD-10-CM | POA: Diagnosis not present

## 2017-07-04 DIAGNOSIS — R200 Anesthesia of skin: Secondary | ICD-10-CM

## 2017-07-04 DIAGNOSIS — Z7982 Long term (current) use of aspirin: Secondary | ICD-10-CM | POA: Diagnosis not present

## 2017-07-04 DIAGNOSIS — Z79899 Other long term (current) drug therapy: Secondary | ICD-10-CM | POA: Diagnosis not present

## 2017-07-04 DIAGNOSIS — M67471 Ganglion, right ankle and foot: Secondary | ICD-10-CM | POA: Insufficient documentation

## 2017-07-04 DIAGNOSIS — I10 Essential (primary) hypertension: Secondary | ICD-10-CM | POA: Insufficient documentation

## 2017-07-04 DIAGNOSIS — G44229 Chronic tension-type headache, not intractable: Secondary | ICD-10-CM | POA: Diagnosis not present

## 2017-07-04 MED ORDER — LEVETIRACETAM ER 500 MG PO TB24: 500.0000 mg | ORAL_TABLET | Freq: Every day | ORAL | 3 refills | Status: DC

## 2017-07-04 MED ORDER — LISINOPRIL-HYDROCHLOROTHIAZIDE 20-25 MG PO TABS: 1.0000 | ORAL_TABLET | Freq: Every day | ORAL | 3 refills | Status: DC

## 2017-07-04 MED ORDER — ATORVASTATIN CALCIUM 40 MG PO TABS: 40.0000 mg | ORAL_TABLET | Freq: Every day | ORAL | 3 refills | Status: DC

## 2017-07-04 MED ORDER — GABAPENTIN 300 MG PO CAPS: 300.0000 mg | ORAL_CAPSULE | Freq: Three times a day (TID) | ORAL | 3 refills | Status: DC

## 2017-07-04 MED ORDER — FLUTICASONE PROPIONATE 50 MCG/ACT NA SUSP: 2.0000 | Freq: Every day | NASAL | 1 refills | Status: DC

## 2017-07-04 MED FILL — ATORVASTATIN 40 MG TABLET: 40 | 30 days supply | Qty: 30 | Fill #0

## 2017-07-04 MED FILL — LISINOPRIL-HCTZ 20-25 MG TA: 20-25 | 30 days supply | Qty: 30 | Fill #0

## 2017-07-04 MED FILL — FLUTICASONE PROP 50 MCG SPR: 50 | 30 days supply | Qty: 16 | Fill #0

## 2017-07-04 NOTE — Progress Notes (Signed)
Alison Ward, is a 56 y.o. female  EZM:[id number]  [id number]:[id number]  DOB - Apr 19, 1961  Chief Complaint  Patient presents with  . Cyst      Subjective:   Alison Ward is a 56 y.o. female with history of hypertension, chronic headaches, remote history of temporal parenchymal intracerebral hemorrhage in September 2014 here today for an urgent care. Patient is complaining of a knot on her right foot, she hit her foot on her mother's bed about a year ago and she thinks that there is a knot around the area of injury that refused to resolve. She thinks her bone is growing at that site and she would like to check it out. She denies any major pain. No redness. No other swelling on any other part of her foot. Patient has No headache, No chest pain, No abdominal pain - No Nausea, No new weakness tingling or numbness, No Cough - SOB. She also needs refill of her medications today.  Problem  Ganglion Cyst of Right Foot    ALLERGIES: Allergies  Allergen Reactions  . Elavil [Amitriptyline] Other (See Comments)    DIZZINESS  . Topiramate Er Other (See Comments)    Suicidal Thoughts- Trokendi    PAST MEDICAL HISTORY: Past Medical History:  Diagnosis Date  . Chronic headaches    daily, bitemporal, associated with tingling in face  . Heart murmur   . Hypertension   . Seizures ([place])    partial seizures last one on 10/22/15  On Keppra currently   . Stroke [facility]) 07/05/2013    MEDICATIONS AT HOME: Prior to Admission medications   Medication Sig Start Date End Date Taking? Authorizing Provider  acetaminophen (TYLENOL) 500 MG tablet Take 500 mg by mouth every 6 (six) hours as needed.    [provider]  aspirin EC 81 MG tablet Take 1 tablet (81 mg total) by mouth daily. 01/15/17   Tresa Garter, MD  atorvastatin (LIPITOR) 40 MG tablet Take 1 tablet (40 mg total) by mouth daily. 07/04/17   Tresa Garter, MD  fluticasone (FLONASE) 50 MCG/ACT nasal spray Place 2 sprays into  both nostrils daily. 07/04/17   Tresa Garter, MD  gabapentin (NEURONTIN) 300 MG capsule Take 1 capsule (300 mg total) by mouth 3 (three) times daily. 07/04/17   Tresa Garter, MD  levETIRAcetam (KEPPRA XR) 500 MG 24 hr tablet Take 1 tablet (500 mg total) by mouth daily. 07/04/17   Tresa Garter, MD  lisinopril-hydrochlorothiazide (PRINZIDE,ZESTORETIC) 20-25 MG tablet Take 1 tablet by mouth daily. 07/04/17   Tresa Garter, MD    Objective:   Vitals:   07/04/17 1443  BP: 110/75  Pulse: 74  Resp: 16  Temp: 98.8 F (37.1 C)  TempSrc: Oral  SpO2: 96%  Weight: 249 lb 9.6 oz (113.2 kg)   Exam General appearance : Awake, alert, not in any distress. Speech Clear. Not toxic looking HEENT: Atraumatic and Normocephalic, pupils equally reactive to light and accomodation Neck: Supple, no JVD. No cervical lymphadenopathy.  Chest: Good air entry bilaterally, no added sounds  CVS: S1 S2 regular, no murmurs.  Abdomen: Bowel sounds present, Non tender and not distended with no gaurding, rigidity or rebound. Extremities: B/L Lower Ext shows no edema, both legs are warm to touch Neurology: Awake alert, and oriented X 3, CN II-XII intact, Non focal Skin: No Rash  Data Review Lab Results  Component Value Date   HGBA1C 5.7 02/07/2017   HGBA1C 5.7 12/30/2015  HGBA1C 5.80 07/08/2015    Assessment & Plan   1. Ganglion cyst of right foot  - DG Foot Complete Right; Future  2. Essential hypertension, benign  - lisinopril-hydrochlorothiazide (PRINZIDE,ZESTORETIC) 20-25 MG tablet; Take 1 tablet by mouth daily.  Dispense: 90 tablet; Refill: 3   We have discussed target BP range and blood pressure goal. I have advised patient to check BP regularly and to call us back or report to clinic if the numbers are consistently higher than 140/90. We discussed the importance of compliance with medical therapy and DASH diet recommended, consequences of uncontrolled hypertension discussed.    - continue current BP medications  3. Left sided numbness  - gabapentin (NEURONTIN) 300 MG capsule; Take 1 capsule (300 mg total) by mouth 3 (three) times daily.  Dispense: 270 capsule; Refill: 3  4. Chronic tension-type headache, not intractable  - levETIRAcetam (KEPPRA XR) 500 MG 24 hr tablet; Take 1 tablet (500 mg total) by mouth daily.  Dispense: 90 tablet; Refill: 3  Patient have been counseled extensively about nutrition and exercise. Other issues discussed during this visit include: low cholesterol diet, weight control and daily exercise, importance of adherence with medications and regular follow-up. We also discussed long term complications of uncontrolled hypertension.   Return in about 6 months (around 01/01/2018) for Follow up HTN.  The patient was given clear instructions to go to ER or return to medical center if symptoms don't improve, worsen or new problems develop. The patient verbalized understanding. The patient was told to call to get lab results if they haven't heard anything in the next week.   This note has been created with Surveyor, quantity. Any transcriptional errors are unintentional.    Angelica Chessman, MD, [place], Karilyn Cota, [place] and [place], [place]   07/04/2017, 3:43 PM

## 2017-07-04 NOTE — Progress Notes (Signed)
Pt is in the office today for a knot on right foot Pt states she hit her foot on her mother bed about a year ago Pt states she is not in any pain

## 2017-07-04 NOTE — Patient Instructions (Signed)
DASH Eating Plan DASH stands for "Dietary Approaches to Stop Hypertension." The DASH eating plan is a healthy eating plan that has been shown to reduce high blood pressure (hypertension). It may also reduce your risk for type 2 diabetes, heart disease, and stroke. The DASH eating plan may also help with weight loss. What are tips for following this plan? General guidelines  Avoid eating more than 2,300 mg (milligrams) of salt (sodium) a day. If you have hypertension, you may need to reduce your sodium intake to 1,500 mg a day.  Limit alcohol intake to no more than 1 drink a day for nonpregnant women and 2 drinks a day for men. One drink equals 12 oz of beer, 5 oz of wine, or 1 oz of hard liquor.  Work with your health care provider to maintain a healthy body weight or to lose weight. Ask what an ideal weight is for you.  Get at least 30 minutes of exercise that causes your heart to beat faster (aerobic exercise) most days of the week. Activities may include walking, swimming, or biking.  Work with your health care provider or diet and nutrition specialist (dietitian) to adjust your eating plan to your individual calorie needs. Reading food labels  Check food labels for the amount of sodium per serving. Choose foods with less than 5 percent of the Daily Value of sodium. Generally, foods with less than 300 mg of sodium per serving fit into this eating plan.  To find whole grains, look for the word "whole" as the first word in the ingredient list. Shopping  Buy products labeled as "low-sodium" or "no salt added."  Buy fresh foods. Avoid canned foods and premade or frozen meals. Cooking  Avoid adding salt when cooking. Use salt-free seasonings or herbs instead of table salt or sea salt. Check with your health care provider or pharmacist before using salt substitutes.  Do not fry foods. Cook foods using healthy methods such as baking, boiling, grilling, and broiling instead.  Cook with  heart-healthy oils, such as olive, canola, soybean, or sunflower oil. Meal planning   Eat a balanced diet that includes: ? 5 or more servings of fruits and vegetables each day. At each meal, try to fill half of your plate with fruits and vegetables. ? Up to 6-8 servings of whole grains each day. ? Less than 6 oz of lean meat, poultry, or fish each day. A 3-oz serving of meat is about the same size as a deck of cards. One egg equals 1 oz. ? 2 servings of low-fat dairy each day. ? A serving of nuts, seeds, or beans 5 times each week. ? Heart-healthy fats. Healthy fats called Omega-3 fatty acids are found in foods such as flaxseeds and coldwater fish, like sardines, salmon, and mackerel.  Limit how much you eat of the following: ? Canned or prepackaged foods. ? Food that is high in trans fat, such as fried foods. ? Food that is high in saturated fat, such as fatty meat. ? Sweets, desserts, sugary drinks, and other foods with added sugar. ? Full-fat dairy products.  Do not salt foods before eating.  Try to eat at least 2 vegetarian meals each week.  Eat more home-cooked food and less restaurant, buffet, and fast food.  When eating at a restaurant, ask that your food be prepared with less salt or no salt, if possible. What foods are recommended? The items listed may not be a complete list. Talk with your dietitian about what   dietary choices are best for you. Grains Whole-grain or whole-wheat bread. Whole-grain or whole-wheat pasta. Brown rice. Oatmeal. Quinoa. Bulgur. Whole-grain and low-sodium cereals. Pita bread. Low-fat, low-sodium crackers. Whole-wheat flour tortillas. Vegetables Fresh or frozen vegetables (raw, steamed, roasted, or grilled). Low-sodium or reduced-sodium tomato and vegetable juice. Low-sodium or reduced-sodium tomato sauce and tomato paste. Low-sodium or reduced-sodium canned vegetables. Fruits All fresh, dried, or frozen fruit. Canned fruit in natural juice (without  added sugar). Meat and other protein foods Skinless chicken or turkey. Ground chicken or turkey. Pork with fat trimmed off. Fish and seafood. Egg whites. Dried beans, peas, or lentils. Unsalted nuts, nut butters, and seeds. Unsalted canned beans. Lean cuts of beef with fat trimmed off. Low-sodium, lean deli meat. Dairy Low-fat (1%) or fat-free (skim) milk. Fat-free, low-fat, or reduced-fat cheeses. Nonfat, low-sodium ricotta or cottage cheese. Low-fat or nonfat yogurt. Low-fat, low-sodium cheese. Fats and oils Soft margarine without trans fats. Vegetable oil. Low-fat, reduced-fat, or light mayonnaise and salad dressings (reduced-sodium). Canola, safflower, olive, soybean, and sunflower oils. Avocado. Seasoning and other foods Herbs. Spices. Seasoning mixes without salt. Unsalted popcorn and pretzels. Fat-free sweets. What foods are not recommended? The items listed may not be a complete list. Talk with your dietitian about what dietary choices are best for you. Grains Baked goods made with fat, such as croissants, muffins, or some breads. Dry pasta or rice meal packs. Vegetables Creamed or fried vegetables. Vegetables in a cheese sauce. Regular canned vegetables (not low-sodium or reduced-sodium). Regular canned tomato sauce and paste (not low-sodium or reduced-sodium). Regular tomato and vegetable juice (not low-sodium or reduced-sodium). Pickles. Olives. Fruits Canned fruit in a light or heavy syrup. Fried fruit. Fruit in cream or butter sauce. Meat and other protein foods Fatty cuts of meat. Ribs. Fried meat. Bacon. Sausage. Bologna and other processed lunch meats. Salami. Fatback. Hotdogs. Bratwurst. Salted nuts and seeds. Canned beans with added salt. Canned or smoked fish. Whole eggs or egg yolks. Chicken or turkey with skin. Dairy Whole or 2% milk, cream, and half-and-half. Whole or full-fat cream cheese. Whole-fat or sweetened yogurt. Full-fat cheese. Nondairy creamers. Whipped toppings.  Processed cheese and cheese spreads. Fats and oils Butter. Stick margarine. Lard. Shortening. Ghee. Bacon fat. Tropical oils, such as coconut, palm kernel, or palm oil. Seasoning and other foods Salted popcorn and pretzels. Onion salt, garlic salt, seasoned salt, table salt, and sea salt. Worcestershire sauce. Tartar sauce. Barbecue sauce. Teriyaki sauce. Soy sauce, including reduced-sodium. Steak sauce. Canned and packaged gravies. Fish sauce. Oyster sauce. Cocktail sauce. Horseradish that you find on the shelf. Ketchup. Mustard. Meat flavorings and tenderizers. Bouillon cubes. Hot sauce and Tabasco sauce. Premade or packaged marinades. Premade or packaged taco seasonings. Relishes. Regular salad dressings. Where to find more information:  National Heart, Lung, and Blood Institute: www.nhlbi.nih.gov  American Heart Association: www.heart.org Summary  The DASH eating plan is a healthy eating plan that has been shown to reduce high blood pressure (hypertension). It may also reduce your risk for type 2 diabetes, heart disease, and stroke.  With the DASH eating plan, you should limit salt (sodium) intake to 2,300 mg a day. If you have hypertension, you may need to reduce your sodium intake to 1,500 mg a day.  When on the DASH eating plan, aim to eat more fresh fruits and vegetables, whole grains, lean proteins, low-fat dairy, and heart-healthy fats.  Work with your health care provider or diet and nutrition specialist (dietitian) to adjust your eating plan to your individual   calorie needs. This information is not intended to replace advice given to you by your health care provider. Make sure you discuss any questions you have with your health care provider. Document Released: 10/05/2011 Document Revised: 10/09/2016 Document Reviewed: 10/09/2016 Elsevier Interactive Patient Education  2017 [employer] Hypertension Hypertension is another name for high blood pressure. High blood pressure forces  your heart to work harder to pump blood. This can cause problems over time. There are two numbers in a blood pressure reading. There is a top number (systolic) over a bottom number (diastolic). It is best to have a blood pressure below 120/80. Healthy choices can help lower your blood pressure. You may need medicine to help lower your blood pressure if:  Your blood pressure cannot be lowered with healthy choices.  Your blood pressure is higher than 130/80.  Follow these instructions at home: Eating and drinking  If directed, follow the DASH eating plan. This diet includes: ? Filling half of your plate at each meal with fruits and vegetables. ? Filling one quarter of your plate at each meal with whole grains. Whole grains include whole wheat pasta, brown rice, and whole grain bread. ? Eating or drinking low-fat dairy products, such as skim milk or low-fat yogurt. ? Filling one quarter of your plate at each meal with low-fat (lean) proteins. Low-fat proteins include fish, skinless chicken, eggs, beans, and tofu. ? Avoiding fatty meat, cured and processed meat, or chicken with skin. ? Avoiding premade or processed food.  Eat less than 1,500 mg of salt (sodium) a day.  Limit alcohol use to no more than 1 drink a day for nonpregnant women and 2 drinks a day for men. One drink equals 12 oz of beer, 5 oz of wine, or 1 oz of hard liquor. Lifestyle  Work with your doctor to stay at a healthy weight or to lose weight. Ask your doctor what the best weight is for you.  Get at least 30 minutes of exercise that causes your heart to beat faster (aerobic exercise) most days of the week. This may include walking, swimming, or biking.  Get at least 30 minutes of exercise that strengthens your muscles (resistance exercise) at least 3 days a week. This may include lifting weights or pilates.  Do not use any products that contain nicotine or tobacco. This includes cigarettes and e-cigarettes. If you need  help quitting, ask your doctor.  Check your blood pressure at home as told by your doctor.  Keep all follow-up visits as told by your doctor. This is important. Medicines  Take over-the-counter and prescription medicines only as told by your doctor. Follow directions carefully.  Do not skip doses of blood pressure medicine. The medicine does not work as well if you skip doses. Skipping doses also puts you at risk for problems.  Ask your doctor about side effects or reactions to medicines that you should watch for. Contact a doctor if:  You think you are having a reaction to the medicine you are taking.  You have headaches that keep coming back (recurring).  You feel dizzy.  You have swelling in your ankles.  You have trouble with your vision. Get help right away if:  You get a very bad headache.  You start to feel confused.  You feel weak or numb.  You feel faint.  You get very bad pain in your: ? Chest. ? Belly (abdomen).  You throw up (vomit) more than once.  You have trouble breathing. Summary  Hypertension is another name for high blood pressure.  Making healthy choices can help lower blood pressure. If your blood pressure cannot be controlled with healthy choices, you may need to take medicine. This information is not intended to replace advice given to you by your health care provider. Make sure you discuss any questions you have with your health care provider. Document Released: 04/03/2008 Document Revised: 09/13/2016 Document Reviewed: 09/13/2016 Elsevier Interactive Patient Education  2018 [employer]. Ganglion Cyst A ganglion cyst is a noncancerous, fluid-filled lump that occurs near joints or tendons. The ganglion cyst grows out of a joint or the lining of a tendon. It most often develops in the hand or wrist, but it can also develop in the shoulder, elbow, hip, knee, ankle, or foot. The round or oval ganglion cyst can be the size of a pea or larger than  a grape. Increased activity may enlarge the size of the cyst because more fluid starts to build up. What are the causes? It is not known what causes a ganglion cyst to grow. However, it may be related to:  Inflammation or irritation around the joint.  An injury.  Repetitive movements or overuse.  Arthritis.  What increases the risk? Risk factors include:  Being a woman.  Being age 81-50.  What are the signs or symptoms? Symptoms may include:  A lump. This most often appears on the hand or wrist, but it can occur in other areas of the body.  Tingling.  Pain.  Numbness.  Muscle weakness.  Weak grip.  Less movement in a joint.  How is this diagnosed? Ganglion cysts are most often diagnosed based on a physical exam. Your health care provider will feel the lump and may shine a light alongside it. If it is a ganglion cyst, a light often shines through it. Your health care provider may order an X-ray, ultrasound, or MRI to rule out other conditions. How is this treated? Ganglion cysts usually go away on their own without treatment. If pain or other symptoms are involved, treatment may be needed. Treatment is also needed if the ganglion cyst limits your movement or if it gets infected. Treatment may include:  Wearing a brace or splint on your wrist or finger.  Taking anti-inflammatory medicine.  Draining fluid from the lump with a needle (aspiration).  Injecting a steroid into the joint.  Surgery to remove the ganglion cyst.  Follow these instructions at home:  Do not press on the ganglion cyst, poke it with a needle, or hit it.  Take medicines only as directed by your health care provider.  Wear your brace or splint as directed by your health care provider.  Watch your ganglion cyst for any changes.  Keep all follow-up visits as directed by your health care provider. This is important. Contact a health care provider if:  Your ganglion cyst becomes larger or  more painful.  You have increased redness, red streaks, or swelling.  You have pus coming from the lump.  You have weakness or numbness in the affected area.  You have a fever or chills. This information is not intended to replace advice given to you by your health care provider. Make sure you discuss any questions you have with your health care provider. Document Released: 10/13/2000 Document Revised: 03/23/2016 Document Reviewed: 03/31/2014 Elsevier Interactive Patient Education  2018 [employer].

## 2017-07-09 ENCOUNTER — Ambulatory Visit (HOSPITAL_COMMUNITY)
Admission: RE | Admit: 2017-07-09 | Discharge: 2017-07-09 | Disposition: A | Discharge status: Home / Self Care | Payer: Medicaid Other | Source: Ambulatory Visit | Attending: Internal Medicine | Admitting: Internal Medicine

## 2017-07-09 DIAGNOSIS — M19071 Primary osteoarthritis, right ankle and foot: Secondary | ICD-10-CM | POA: Diagnosis not present

## 2017-07-09 DIAGNOSIS — M7731 Calcaneal spur, right foot: Secondary | ICD-10-CM | POA: Insufficient documentation

## 2017-07-09 DIAGNOSIS — M7989 Other specified soft tissue disorders: Secondary | ICD-10-CM | POA: Diagnosis not present

## 2017-07-09 DIAGNOSIS — M67471 Ganglion, right ankle and foot: Secondary | ICD-10-CM | POA: Diagnosis present

## 2017-07-09 DIAGNOSIS — M2011 Hallux valgus (acquired), right foot: Secondary | ICD-10-CM | POA: Diagnosis not present

## 2017-07-09 MED FILL — GABAPENTIN 300 MG CAPSULE: 300 | 30 days supply | Qty: 90 | Fill #5

## 2017-07-16 ENCOUNTER — Telehealth: Payer: Self-pay | Admitting: *Deleted

## 2017-07-16 NOTE — Telephone Encounter (Signed)
Patient verified DOB Patient is aware of xray showing soft tissue swelling with no fracture or dislocation. Patient is aware of MRI being the next step if pain worsens. Patient denies moderate/severe  pain at this time and states the swelling is minimal. No further questions at this time.

## 2017-07-16 NOTE — Telephone Encounter (Signed)
-----   Message from Tresa Garter, MD sent at 07/13/2017 10:39 AM EDT ----- Please inform patient that her right foot X-Ray showed no fracture or dislocation but a soft tissue swelling, most likely will improve with time. If swelling or pain persists please report to the clinic for reevaluation and possible MRI

## 2017-07-23 MED FILL — LEVETIRACETAM ER 500 MG TAB: 500 | 30 days supply | Qty: 30 | Fill #7

## 2017-08-03 MED FILL — LISINOPRIL-HCTZ 20-25 MG TA: 20-25 | 30 days supply | Qty: 30 | Fill #1

## 2017-08-03 MED FILL — ATORVASTATIN 40 MG TABLET: 40 | 30 days supply | Qty: 30 | Fill #1

## 2017-08-20 MED FILL — GABAPENTIN 300 MG CAPSULE: 300 | 30 days supply | Qty: 90 | Fill #6

## 2017-08-27 MED FILL — LEVETIRACETAM ER 500 MG TAB: 500 | 30 days supply | Qty: 30 | Fill #8

## 2017-09-07 ENCOUNTER — Other Ambulatory Visit: Payer: Self-pay | Admitting: Internal Medicine

## 2017-09-07 MED FILL — LISINOPRIL-HCTZ 20-25 MG TA: 20-25 | 30 days supply | Qty: 30 | Fill #2

## 2017-09-10 MED FILL — ATORVASTATIN 40 MG TABLET: 40 | 30 days supply | Qty: 30 | Fill #2

## 2017-09-24 MED FILL — LEVETIRACETAM ER 500 MG TAB: 500 | 30 days supply | Qty: 30 | Fill #9

## 2017-09-24 MED FILL — GABAPENTIN 300 MG CAPSULE: 300 | 30 days supply | Qty: 90 | Fill #7

## 2017-10-08 MED FILL — ATORVASTATIN 40 MG TABLET: 40 | 30 days supply | Qty: 30 | Fill #3

## 2017-10-08 MED FILL — LISINOPRIL-HCTZ 20-25 MG TA: 20-25 | 30 days supply | Qty: 30 | Fill #3

## 2017-11-02 MED FILL — LEVETIRACETAM ER 500 MG TAB: 500 | 30 days supply | Qty: 30 | Fill #10

## 2017-11-02 MED FILL — LISINOPRIL-HCTZ 20-25 MG TA: 20-25 | 30 days supply | Qty: 30 | Fill #4

## 2017-11-12 MED FILL — ATORVASTATIN 40 MG TABLET: 40 | 30 days supply | Qty: 30 | Fill #4

## 2017-11-12 MED FILL — GABAPENTIN 300 MG CAPSULE: 300 | 30 days supply | Qty: 90 | Fill #8

## 2017-11-21 ENCOUNTER — Ambulatory Visit: Payer: Medicaid Other | Attending: Internal Medicine | Admitting: Internal Medicine

## 2017-11-21 ENCOUNTER — Encounter: Payer: Self-pay | Admitting: Internal Medicine

## 2017-11-21 DIAGNOSIS — Z8673 Personal history of transient ischemic attack (TIA), and cerebral infarction without residual deficits: Secondary | ICD-10-CM | POA: Insufficient documentation

## 2017-11-21 DIAGNOSIS — Z7982 Long term (current) use of aspirin: Secondary | ICD-10-CM | POA: Diagnosis not present

## 2017-11-21 DIAGNOSIS — G44229 Chronic tension-type headache, not intractable: Secondary | ICD-10-CM | POA: Diagnosis not present

## 2017-11-21 DIAGNOSIS — Z79899 Other long term (current) drug therapy: Secondary | ICD-10-CM | POA: Diagnosis not present

## 2017-11-21 DIAGNOSIS — R200 Anesthesia of skin: Secondary | ICD-10-CM | POA: Diagnosis not present

## 2017-11-21 DIAGNOSIS — Z888 Allergy status to other drugs, medicaments and biological substances status: Secondary | ICD-10-CM | POA: Diagnosis not present

## 2017-11-21 DIAGNOSIS — I10 Essential (primary) hypertension: Secondary | ICD-10-CM | POA: Insufficient documentation

## 2017-11-21 MED ORDER — LEVETIRACETAM ER 500 MG PO TB24
500.0000 mg | ORAL_TABLET | Freq: Every day | ORAL | 3 refills | Status: DC
Start: 2017-11-21 — End: 2018-03-12

## 2017-11-21 MED ORDER — LISINOPRIL-HYDROCHLOROTHIAZIDE 20-25 MG PO TABS: 1.0000 | ORAL_TABLET | Freq: Every day | ORAL | 3 refills | Status: DC

## 2017-11-21 MED ORDER — ATORVASTATIN CALCIUM 40 MG PO TABS: 40.0000 mg | ORAL_TABLET | Freq: Every day | ORAL | 3 refills | Status: DC

## 2017-11-21 MED ORDER — GABAPENTIN 300 MG PO CAPS: 600.0000 mg | ORAL_CAPSULE | Freq: Three times a day (TID) | ORAL | 3 refills | Status: DC

## 2017-11-21 NOTE — Progress Notes (Signed)
Alison Ward, is a 57 y.o. female  WHQ:[id number]  [id number]  DOB - 1961-04-30  Chief Complaint  Patient presents with  . Headache      Subjective:   Alison Ward is a 57 y.o. 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 She has no significant complaint today. She needs her medications refilled. She claims adherence with her medications, reports no more side effects. She denies any suicidal ideations or thoughts. Patient has No headache, No chest pain, No abdominal pain - No Nausea, No new weakness tingling or numbness, No Cough - SOB. She follows up regularly with Neurologist.   ALLERGIES: Allergies  Allergen Reactions  . Elavil [Amitriptyline] Other (See Comments)    DIZZINESS  . Topiramate Er Other (See Comments)    Suicidal Thoughts- [place]    PAST MEDICAL HISTORY: Past Medical History:  Diagnosis Date  . Chronic headaches    daily, bitemporal, associated with tingling in face  . Heart murmur   . Hypertension   . 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 Stroke [facility]) 07/05/2013    MEDICATIONS AT HOME: Prior to Admission medications   Medication Sig Start Date End Date Taking? Authorizing Provider  acetaminophen (TYLENOL) 500 MG tablet Take 500 mg by mouth every 6 (six) hours as needed.   Yes [provider]  aspirin EC 81 MG tablet Take 1 tablet (81 mg total) by mouth daily. 01/15/17  Yes Tresa Garter, MD  atorvastatin (LIPITOR) 40 MG tablet Take 1 tablet (40 mg total) by mouth daily. 11/21/17  Yes Tresa Garter, MD  fluticasone (FLONASE) 50 MCG/ACT nasal spray Place 2 sprays into both nostrils daily. 07/04/17  Yes Tresa Garter, MD  gabapentin (NEURONTIN) 300 MG capsule Take 2 capsules (600 mg total) by mouth 3 (three) times  daily. 11/21/17  Yes Tresa Garter, MD  levETIRAcetam (KEPPRA XR) 500 MG 24 hr tablet Take 1 tablet (500 mg total) by mouth daily. 11/21/17  Yes Tresa Garter, MD  lisinopril-hydrochlorothiazide (PRINZIDE,ZESTORETIC) 20-25 MG tablet Take 1 tablet by mouth daily. 11/21/17  Yes Tresa Garter, MD    Objective:   Vitals:   11/21/17 1634  BP: 115/76  Pulse: 74  Resp: 18  Temp: 97.7 F (36.5 C)  TempSrc: Oral  SpO2: 100%  Weight: 246 lb (111.6 kg)  Height: 5\' 8"  (1.727 m)   Exam General appearance : Awake, alert, not in any distress. Speech Clear. Not toxic looking HEENT: Atraumatic and Normocephalic, pupils equally reactive to light and accomodation Neck: Supple, no JVD. No cervical lymphadenopathy.  Chest: Good air entry bilaterally, no added sounds  CVS: S1 S2 regular, no murmurs.  Abdomen: Bowel sounds present, Non tender and not distended with no gaurding, rigidity or rebound. Extremities: B/L Lower Ext shows no edema, both legs are warm to touch Neurology: Awake alert, and oriented X 3, CN II-XII intact, Non focal Skin: No Rash  Data Review Lab Results  Component Value Date   HGBA1C 5.7 02/07/2017   HGBA1C 5.7 12/30/2015   HGBA1C 5.80 07/08/2015    Assessment & Plan   1. Left sided numbness  - gabapentin (NEURONTIN) 300 MG capsule; Take 2 capsules (600 mg total) by mouth 3 (three) times daily.  Dispense: 540 capsule; Refill: 3 - Ambulatory referral to  Physical Therapy  2. Chronic tension-type headache, not intractable  - levETIRAcetam (KEPPRA XR) 500 MG 24 hr tablet; Take 1 tablet (500 mg total) by mouth daily.  Dispense: 90 tablet; Refill: 3  3. Essential hypertension, benign  - lisinopril-hydrochlorothiazide (PRINZIDE,ZESTORETIC) 20-25 MG tablet; Take 1 tablet by mouth daily.  Dispense: 90 tablet; Refill: 3  Patient have been counseled extensively about nutrition and exercise. Other issues discussed during this visit include: low cholesterol  diet, weight control and daily exercise, foot care,  importance of adherence with medications and regular follow-up. We also discussed long term complications of uncontrolled hypertension.   Return in about 6 months (around 05/21/2018) for Routine Follow Up, Follow up HTN, Follow up Pain and comorbidities.  The patient was given clear instructions to go to ER or return to medical center if symptoms don't improve, worsen or new problems develop. The patient verbalized understanding. The patient was told to call to get lab results if they haven't heard anything in the next week.   This note has been created with Surveyor, quantity. Any transcriptional errors are unintentional.    Angelica Chessman, MD, MHA, Karilyn Cota, [place] [facility] [place], [place]   11/21/2017, 5:13 PM

## 2017-11-21 NOTE — Patient Instructions (Signed)
DASH Eating Plan DASH stands for "Dietary Approaches to Stop Hypertension." The DASH eating plan is a healthy eating plan that has been shown to reduce high blood pressure (hypertension). It may also reduce your risk for type 2 diabetes, heart disease, and stroke. The DASH eating plan may also help with weight loss. What are tips for following this plan? General guidelines  Avoid eating more than 2,300 mg (milligrams) of salt (sodium) a day. If you have hypertension, you may need to reduce your sodium intake to 1,500 mg a day.  Limit alcohol intake to no more than 1 drink a day for nonpregnant women and 2 drinks a day for men. One drink equals 12 oz of beer, 5 oz of wine, or 1 oz of hard liquor.  Work with your health care provider to maintain a healthy body weight or to lose weight. Ask what an ideal weight is for you.  Get at least 30 minutes of exercise that causes your heart to beat faster (aerobic exercise) most days of the week. Activities may include walking, swimming, or biking.  Work with your health care provider or diet and nutrition specialist (dietitian) to adjust your eating plan to your individual calorie needs. Reading food labels  Check food labels for the amount of sodium per serving. Choose foods with less than 5 percent of the Daily Value of sodium. Generally, foods with less than 300 mg of sodium per serving fit into this eating plan.  To find whole grains, look for the word "whole" as the first word in the ingredient list. Shopping  Buy products labeled as "low-sodium" or "no salt added."  Buy fresh foods. Avoid canned foods and premade or frozen meals. Cooking  Avoid adding salt when cooking. Use salt-free seasonings or herbs instead of table salt or sea salt. Check with your health care provider or pharmacist before using salt substitutes.  Do not fry foods. Cook foods using healthy methods such as baking, boiling, grilling, and broiling instead.  Cook with  heart-healthy oils, such as olive, canola, soybean, or sunflower oil. Meal planning   Eat a balanced diet that includes: ? 5 or more servings of fruits and vegetables each day. At each meal, try to fill half of your plate with fruits and vegetables. ? Up to 6-8 servings of whole grains each day. ? Less than 6 oz of lean meat, poultry, or fish each day. A 3-oz serving of meat is about the same size as a deck of cards. One egg equals 1 oz. ? 2 servings of low-fat dairy each day. ? A serving of nuts, seeds, or beans 5 times each week. ? Heart-healthy fats. Healthy fats called Omega-3 fatty acids are found in foods such as flaxseeds and coldwater fish, like sardines, salmon, and mackerel.  Limit how much you eat of the following: ? Canned or prepackaged foods. ? Food that is high in trans fat, such as fried foods. ? Food that is high in saturated fat, such as fatty meat. ? Sweets, desserts, sugary drinks, and other foods with added sugar. ? Full-fat dairy products.  Do not salt foods before eating.  Try to eat at least 2 vegetarian meals each week.  Eat more home-cooked food and less restaurant, buffet, and fast food.  When eating at a restaurant, ask that your food be prepared with less salt or no salt, if possible. What foods are recommended? The items listed may not be a complete list. Talk with your dietitian about what   dietary choices are best for you. Grains Whole-grain or whole-wheat bread. Whole-grain or whole-wheat pasta. Brown rice. Oatmeal. Quinoa. Bulgur. Whole-grain and low-sodium cereals. Pita bread. Low-fat, low-sodium crackers. Whole-wheat flour tortillas. Vegetables Fresh or frozen vegetables (raw, steamed, roasted, or grilled). Low-sodium or reduced-sodium tomato and vegetable juice. Low-sodium or reduced-sodium tomato sauce and tomato paste. Low-sodium or reduced-sodium canned vegetables. Fruits All fresh, dried, or frozen fruit. Canned fruit in natural juice (without  added sugar). Meat and other protein foods Skinless chicken or turkey. Ground chicken or turkey. Pork with fat trimmed off. Fish and seafood. Egg whites. Dried beans, peas, or lentils. Unsalted nuts, nut butters, and seeds. Unsalted canned beans. Lean cuts of beef with fat trimmed off. Low-sodium, lean deli meat. Dairy Low-fat (1%) or fat-free (skim) milk. Fat-free, low-fat, or reduced-fat cheeses. Nonfat, low-sodium ricotta or cottage cheese. Low-fat or nonfat yogurt. Low-fat, low-sodium cheese. Fats and oils Soft margarine without trans fats. Vegetable oil. Low-fat, reduced-fat, or light mayonnaise and salad dressings (reduced-sodium). Canola, safflower, olive, soybean, and sunflower oils. Avocado. Seasoning and other foods Herbs. Spices. Seasoning mixes without salt. Unsalted popcorn and pretzels. Fat-free sweets. What foods are not recommended? The items listed may not be a complete list. Talk with your dietitian about what dietary choices are best for you. Grains Baked goods made with fat, such as croissants, muffins, or some breads. Dry pasta or rice meal packs. Vegetables Creamed or fried vegetables. Vegetables in a cheese sauce. Regular canned vegetables (not low-sodium or reduced-sodium). Regular canned tomato sauce and paste (not low-sodium or reduced-sodium). Regular tomato and vegetable juice (not low-sodium or reduced-sodium). Pickles. Olives. Fruits Canned fruit in a light or heavy syrup. Fried fruit. Fruit in cream or butter sauce. Meat and other protein foods Fatty cuts of meat. Ribs. Fried meat. Bacon. Sausage. Bologna and other processed lunch meats. Salami. Fatback. Hotdogs. Bratwurst. Salted nuts and seeds. Canned beans with added salt. Canned or smoked fish. Whole eggs or egg yolks. Chicken or turkey with skin. Dairy Whole or 2% milk, cream, and half-and-half. Whole or full-fat cream cheese. Whole-fat or sweetened yogurt. Full-fat cheese. Nondairy creamers. Whipped toppings.  Processed cheese and cheese spreads. Fats and oils Butter. Stick margarine. Lard. Shortening. Ghee. Bacon fat. Tropical oils, such as coconut, palm kernel, or palm oil. Seasoning and other foods Salted popcorn and pretzels. Onion salt, garlic salt, seasoned salt, table salt, and sea salt. Worcestershire sauce. Tartar sauce. Barbecue sauce. Teriyaki sauce. Soy sauce, including reduced-sodium. Steak sauce. Canned and packaged gravies. Fish sauce. Oyster sauce. Cocktail sauce. Horseradish that you find on the shelf. Ketchup. Mustard. Meat flavorings and tenderizers. Bouillon cubes. Hot sauce and Tabasco sauce. Premade or packaged marinades. Premade or packaged taco seasonings. Relishes. Regular salad dressings. Where to find more information:  National Heart, Lung, and Blood Institute: www.nhlbi.nih.gov  American Heart Association: www.heart.org Summary  The DASH eating plan is a healthy eating plan that has been shown to reduce high blood pressure (hypertension). It may also reduce your risk for type 2 diabetes, heart disease, and stroke.  With the DASH eating plan, you should limit salt (sodium) intake to 2,300 mg a day. If you have hypertension, you may need to reduce your sodium intake to 1,500 mg a day.  When on the DASH eating plan, aim to eat more fresh fruits and vegetables, whole grains, lean proteins, low-fat dairy, and heart-healthy fats.  Work with your health care provider or diet and nutrition specialist (dietitian) to adjust your eating plan to your individual   calorie needs. This information is not intended to replace advice given to you by your health care provider. Make sure you discuss any questions you have with your health care provider. Document Released: 10/05/2011 Document Revised: 10/09/2016 Document Reviewed: 10/09/2016 Elsevier Interactive Patient Education  2018 [employer]  Hypertension Hypertension is another name for high blood pressure. High blood pressure  forces your heart to work harder to pump blood. This can cause problems over time. There are two numbers in a blood pressure reading. There is a top number (systolic) over a bottom number (diastolic). It is best to have a blood pressure below 120/80. Healthy choices can help lower your blood pressure. You may need medicine to help lower your blood pressure if:  Your blood pressure cannot be lowered with healthy choices.  Your blood pressure is higher than 130/80.  Follow these instructions at home: Eating and drinking  If directed, follow the DASH eating plan. This diet includes: ? Filling half of your plate at each meal with fruits and vegetables. ? Filling one quarter of your plate at each meal with whole grains. Whole grains include whole wheat pasta, brown rice, and whole grain bread. ? Eating or drinking low-fat dairy products, such as skim milk or low-fat yogurt. ? Filling one quarter of your plate at each meal with low-fat (lean) proteins. Low-fat proteins include fish, skinless chicken, eggs, beans, and tofu. ? Avoiding fatty meat, cured and processed meat, or chicken with skin. ? Avoiding premade or processed food.  Eat less than 1,500 mg of salt (sodium) a day.  Limit alcohol use to no more than 1 drink a day for nonpregnant women and 2 drinks a day for men. One drink equals 12 oz of beer, 5 oz of wine, or 1 oz of hard liquor. Lifestyle  Work with your doctor to stay at a healthy weight or to lose weight. Ask your doctor what the best weight is for you.  Get at least 30 minutes of exercise that causes your heart to beat faster (aerobic exercise) most days of the week. This may include walking, swimming, or biking.  Get at least 30 minutes of exercise that strengthens your muscles (resistance exercise) at least 3 days a week. This may include lifting weights or pilates.  Do not use any products that contain nicotine or tobacco. This includes cigarettes and e-cigarettes. If you  need help quitting, ask your doctor.  Check your blood pressure at home as told by your doctor.  Keep all follow-up visits as told by your doctor. This is important. Medicines  Take over-the-counter and prescription medicines only as told by your doctor. Follow directions carefully.  Do not skip doses of blood pressure medicine. The medicine does not work as well if you skip doses. Skipping doses also puts you at risk for problems.  Ask your doctor about side effects or reactions to medicines that you should watch for. Contact a doctor if:  You think you are having a reaction to the medicine you are taking.  You have headaches that keep coming back (recurring).  You feel dizzy.  You have swelling in your ankles.  You have trouble with your vision. Get help right away if:  You get a very bad headache.  You start to feel confused.  You feel weak or numb.  You feel faint.  You get very bad pain in your: ? Chest. ? Belly (abdomen).  You throw up (vomit) more than once.  You have trouble breathing.   Hypertension is another name for high blood pressure.  Making healthy choices can help lower blood pressure. If your blood pressure cannot be controlled with healthy choices, you may need to take medicine. This information is not intended to replace advice given to you by your health care provider. Make sure you discuss any questions you have with your health care provider. Document Released: 04/03/2008 Document Revised: 09/13/2016 Document Reviewed: 09/13/2016 Elsevier Interactive Patient Education  2018 [employer]. How to Take Your Blood Pressure Blood pressure is a measurement of how strongly your blood is pressing against the walls of your arteries. Arteries are blood vessels that carry blood from your heart throughout your body. Your health care provider takes your blood pressure at each office visit. You can also take your own blood pressure at home with a blood  pressure machine. You may need to take your own blood pressure:  To confirm a diagnosis of high blood pressure (hypertension).  To monitor your blood pressure over time.  To make sure your blood pressure medicine is working.  Supplies needed: To take your blood pressure, you will need a blood pressure machine. You can buy a blood pressure machine, or blood pressure monitor, at most drugstores or online. There are several types of home blood pressure monitors. When choosing one, consider the following:  Choose a monitor that has an arm cuff.  Choose a monitor that wraps snugly around your upper arm. You should be able to fit only one finger between your arm and the cuff.  Do not choose a monitor that measures your blood pressure from your wrist or finger.  Your health care provider can suggest a reliable monitor that will meet your needs. How to prepare To get the most accurate reading, avoid the following for 30 minutes before you check your blood pressure:  Drinking caffeine.  Drinking alcohol.  Eating.  Smoking.  Exercising.  Five minutes before you check your blood pressure:  Empty your bladder.  Sit quietly without talking in a dining chair, rather than in a soft couch or armchair.  How to take your blood pressure To check your blood pressure, follow the instructions in the manual that came with your blood pressure monitor. If you have a digital blood pressure monitor, the instructions may be as follows: 1. Sit up straight. 2. Place your feet on the floor. Do not cross your ankles or legs. 3. Rest your left arm at the level of your heart on a table or desk or on the arm of a chair. 4. Pull up your shirt sleeve. 5. Wrap the blood pressure cuff around the upper part of your left arm, 1 inch (2.5 cm) above your elbow. It is best to wrap the cuff around bare skin. 6. Fit the cuff snugly around your arm. You should be able to place only one finger between the cuff and your  arm. 7. Position the cord inside the groove of your elbow. 8. Press the power button. 9. Sit quietly while the cuff inflates and deflates. 10. Read the digital reading on the monitor screen and write it down (record it). 11. Wait 2-3 minutes, then repeat the steps, starting at step 1.  What does my blood pressure reading mean? A blood pressure reading consists of a higher number over a lower number. Ideally, your blood pressure should be below 120/80. The first ("top") number is called the systolic pressure. It is a measure of the pressure in your arteries as your heart beats.  The second ("bottom") number is called the diastolic pressure. It is a measure of the pressure in your arteries as the heart relaxes. Blood pressure is classified into four stages. The following are the stages for adults who do not have a short-term serious illness or a chronic condition. Systolic pressure and diastolic pressure are measured in a unit called mm Hg. Normal  Systolic pressure: below 330.  Diastolic pressure: below 80. Elevated  Systolic pressure: 076-226.  Diastolic pressure: below 80. Hypertension stage 1  Systolic pressure: 333-545.  Diastolic pressure: 62-56. Hypertension stage 2  Systolic pressure: 389 or above.  Diastolic pressure: 90 or above. You can have prehypertension or hypertension even if only the systolic or only the diastolic number in your reading is higher than normal. Follow these instructions at home:  Check your blood pressure as often as recommended by your health care provider.  Take your monitor to the next appointment with your health care provider to make sure: ? That you are using it correctly. ? That it provides accurate readings.  Be sure you understand what your goal blood pressure numbers are.  Tell your health care provider if you are having any side effects from blood pressure medicine. Contact a health care provider if:  Your blood pressure is  consistently high. Get help right away if:  Your systolic blood pressure is higher than 180.  Your diastolic blood pressure is higher than 110. This information is not intended to replace advice given to you by your health care provider. Make sure you discuss any questions you have with your health care provider. Document Released: 03/24/2016 Document Revised: 06/06/2016 Document Reviewed: 03/24/2016 Elsevier Interactive Patient Education  2018 [employer]. Paresthesia Paresthesia is a burning or prickling feeling. This feeling can happen in any part of the body. It often happens in the hands, arms, legs, or feet. Usually, it is not painful. In most cases, the feeling goes away in a short time and is not a sign of a serious problem. Follow these instructions at home:  Avoid drinking alcohol.  Try massage or needle therapy (acupuncture) to help with your problems.  Keep all follow-up visits as told by your doctor. This is important. Contact a doctor if:  You keep on having episodes of paresthesia.  Your burning or prickling feeling gets worse when you walk.  You have pain or cramps.  You feel dizzy.  You have a rash. Get help right away if:  You feel weak.  You have trouble walking or moving.  You have problems speaking, understanding, or seeing.  You feel confused.  You cannot control when you pee (urinate) or poop (bowel movement).  You lose feeling (numbness) after an injury.  You pass out (faint). This information is not intended to replace advice given to you by your health care provider. Make sure you discuss any questions you have with your health care provider. Document Released: 09/28/2008 Document Revised: 03/23/2016 Document Reviewed: 10/12/2014 Elsevier Interactive Patient Education  [employer].

## 2017-11-26 NOTE — Progress Notes (Signed)
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 She states she was sitting on a stool when she fainted and fell off and hit the back of her head. She did not lose consciousness for long and did not have any focal symptoms. EMS were called but patient chose not to go to the hospital and seek any help. Since then she's been having headaches which were initially mild and responded to Tylenol and ibuprofen but for the last couple of months the headaches are now occurring on a daily basis. She describes the headaches as variable from mild dull headaches to severe throbbing headaches which vary in severity from 2/10-7/10.Headache is not accompanied by nausea, vomiting or photophobia but she is bothered by loud sounds. Headaches do increase with physical activity and stress and are relieved by sleep and rest. She has been taking ibuprofen 800 mg once or twice a day with only partial relief. She has recently been given a prescription of Tylenol with Codeine but she has not started taking it yet. Patient denies any visual symptoms or focal neurological symptoms accompanying her headaches. She has not had any recent brain imaging study. She denies any neck pain and raducular pain but does complain of mild tightness of the neck and shoulder muscles. 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 She required ventriculostomy at that time. She did recover from that quite well but did have headaches  which lasted for 6 months to year and then gradually  the headaches disappeared and she did quite well for nearly a year without any headaches. She was tried on a variety of headache medications at that time including Depakote, Topamax, amitriptyline but these did not work or she had side effects. Tizanidine probably helped her the most. She also has a new complaint now of left leg numbness which she describes as tingling sensation involving the anterior and lateral   aspect of her left thigh. This also began since her fall. She has seen her primary physician who has prescribed gabapentin which she takes 300mg  twice daily but it makes her dizzy and does not particularly help her numbness. She denies any severe back pain or radicular pain or trouble with walking, balance or bladder control. She has not had any back x-rays or MRI. Update 1/5/2017PS : She returns for follow-up after last visit 2 months ago. She had noticed improvement in her headaches as well as left thigh paresthesias though she still has numbness is she does not get burning sensation as often. She is on Neurontin 300mg  three times daily. She is not able to afford Lyrica due to cost. She had MRI scan lumbar spine done on 09/29/15 which I personally reviewed showed mild spinal stenosis at L4-5 with without definite pinched nerve. Incidental cystic lesion was noted on the right side in the pelvis. 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 Patient was seen in the emergency room on that day with severe abdominal pain. She was seen by gynecologist  and has abdominal hysterectomy and bilateral oophorectomy planned for February 2017. She also had episode of near-syncope on 10/22/2015. Her grandson was with her and called for help as apparently the patient was unresponsive to him and did not answer questions. Inquiry she admits to 3 somewhat similar  episodes over the last 4 months since October. She feels like she is about to pass out but most of the time she consented down. On one occasion she fell on the floor. She may briefly lose consciousness but does not appear to be confused and dazed. She had EEG done on 10/05/15 which I have reviewed was normal. She has no documented history of seizures. Update 4/24/2018PS : She returns for follow-up after last visit more than a year ago. She is accompanied by her sister and her daughter. He states she's had no further episodes of altered consciousness or seizure-like activity ever since she's been taking Keppra which is tolerating well without side effects. She states her tension headaches are improved on the gabapentin she does complain of some dizziness and imbalance in the morning and takes the morning dose take several hours before her balance is good. She is often bump into objects in the morning. She states her thigh paresthesias with a moped K went for a trip to the [place]. Since December of last year she noticed intermittent increasing numbness in the left leg and at times even relate radiata into involving the left arm occasionally the left face as well. She has not had any recent. She saw a chiropractor recently and after manipulation of her back and neck resulted in worsening headache and paresthesias. She has seen her primary physician is referral to physical therapy but it has not yet been started UPDATE 07/24/2018CM [name], 57 year old female returns for follow-up with a history of right temporal  parenchymal intracerebral hemorrhage,September 2014, seizure disorder,left-sided numbness. She is currently on Keppra for seizure and no seizure since 2016. She is on Neurontin 300 mg 3 times daily for her headaches which are controlled. She was placed on  trokendi ,at her last visit with Dr. Leonie Man for complaints of left-sided numbness. She took the medication for 3 days and was having suicidal thoughts  and stop the medication. She feels the numbness is stable and does not want to try another medication at this time. She returns for reevaluation UPDATE 1/29/2019CM Ms. 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  She is currently on Keppra 500 mg daily and gabapentin 300 mg 2 capsules 3 times daily.  The gabapentin has been beneficial for her headaches.  Last seizure occurred in 2016.  She continues to gain weight and was encouraged to lose weight and eat a healthy diet.  Her numbness is stable.  She occasionally has leg cramps at night.  She has recently returned to driving.  She returns for reevaluation REVIEW OF SYSTEMS: Full 14 system review of systems performed and notable only for those listed, all others are neg:  Constitutional: neg  Cardiovascular: neg Ear/Nose/Throat: neg  Skin: neg Eyes: neg Respiratory: neg Gastroitestinal: neg  Hematology/Lymphatic: neg  Endocrine: neg Musculoskeletal:neg Allergy/Immunology: neg Neurological: headache, seizure disorder, left-sided numbness Psychiatric: neg Sleep : neg   ALLERGIES: Allergies  Allergen Reactions  . Elavil [Amitriptyline] Other (See Comments)    DIZZINESS  . Topiramate Er Other (See Comments)    Suicidal Thoughts- Trokendi    HOME MEDICATIONS: Outpatient Medications  Prior to Visit  Medication Sig Dispense Refill  . acetaminophen (TYLENOL) 500 MG tablet Take 500 mg by mouth every 6 (six) hours as needed.    Marland Kitchen aspirin EC 81 MG tablet Take 1 tablet (81 mg total) by mouth daily. 30 tablet 1  . atorvastatin (LIPITOR) 40 MG tablet Take 1 tablet (40 mg total) by mouth daily. 90 tablet 3  . fluticasone (FLONASE) 50 MCG/ACT nasal spray Place 2 sprays into both nostrils daily. 16 g 1  . gabapentin (NEURONTIN) 300 MG capsule Take 2 capsules (600 mg total) by mouth 3 (three) times daily. 540  capsule 3  . levETIRAcetam (KEPPRA XR) 500 MG 24 hr tablet Take 1 tablet (500 mg total) by mouth daily. 90 tablet 3  . lisinopril-hydrochlorothiazide (PRINZIDE,ZESTORETIC) 20-25 MG tablet Take 1 tablet by mouth daily. 90 tablet 3   No facility-administered medications prior to visit.     PAST MEDICAL HISTORY: Past Medical History:  Diagnosis Date  . Chronic headaches    daily, bitemporal, associated with tingling in face  . Heart murmur   . Hypertension   . Seizures ([place])    partial seizures last one on 10/22/15  On Keppra currently   . Stroke [facility]) 07/05/2013    PAST SURGICAL HISTORY: Past Surgical History:  Procedure Laterality Date  . ABDOMINAL HYSTERECTOMY N/A 12/15/2015   Procedure: HYSTERECTOMY ABDOMINAL;  Surgeon: Emily Filbert, MD;  Location: [facility];  Service: Gynecology;  Laterality: N/A;  . CESAREAN SECTION    . ceserean section    . SALPINGOOPHORECTOMY Bilateral 12/15/2015   Procedure: SALPINGO OOPHORECTOMY;  Surgeon: Emily Filbert, MD;  Location: [facility];  Service: Gynecology;  Laterality: Bilateral;  . VENTRICULOSTOMY Left 07/07/2013   Procedure: VENTRICULOSTOMY;  Surgeon: Ophelia Charter, MD;  Location: [facility];  Service: Neurosurgery;  Laterality: Left;  Left Ventriculostomy and Removal of Right Vetriculostomy    FAMILY HISTORY: Family History  Problem Relation Age of Onset  . Hypertension Mother   . Diabetes Mother   . Stroke Mother   . Hypertension Father   . Colon cancer Neg Hx   . Breast cancer Neg Hx     SOCIAL HISTORY: Social History   Socioeconomic History  . Marital status: Divorced    Spouse name: Not on file  . Number of children: 3  . Years of education: college  . Highest education level: Not on file  Social Needs  . Financial resource strain: Not on file  . Food insecurity - worry: Not on file  . Food insecurity - inability: Not on file  . Transportation needs - medical: Not on file  . Transportation needs - non-medical: Not on file    Occupational History  . Occupation: [employer]  Tobacco Use  . Smoking status: Never Smoker  . Smokeless tobacco: Never Used  Substance and Sexual Activity  . Alcohol use: No  . Drug use: No  . Sexual activity: Not on file  Other Topics Concern  . Not on file  Social History Narrative  . Not on file     PHYSICAL EXAM  Vitals:   11/27/17 1401  BP: 111/77  Pulse: 68  Weight: 251 lb 3.2 oz (113.9 kg)   Body mass index is 38.19 kg/m.  Generalized: Well developed, obese female in no acute distress  Head: normocephalic and atraumatic,. Oropharynx benign  Neck: Supple, no carotid bruits  Cardiac: Regular rate rhythm, no murmur  Musculoskeletal: No deformity   Neurological examination  Mentation: Alert oriented to time, place, history taking. Attention span and concentration appropriate. Recent and remote memory intact.  Follows all commands speech and language fluent.   Cranial nerve II-XII: Pupils were equal round reactive to light extraocular movements were full, visual field were full on confrontational test. Facial sensation and strength were normal. hearing was intact to finger rubbing bilaterally. Uvula tongue midline. head turning and shoulder shrug were normal and symmetric.Tongue protrusion into cheek strength was normal. Motor: normal bulk and tone, full strength in the BUE, BLE,  Sensory: normal and symmetric to light touch, pinprick, and  Vibration,except decrease touch and pinprick in the left thigh on the anterior lateral aspect of the knee  Coordination: finger-nose-finger, heel-to-shin bilaterally, no dysmetria Reflexes:symmetric upper and lower, plantar responses were flexor bilaterally. Gait and Station: Rising up from seated position without assistance, normal stance,  moderate stride, good arm swing, smooth turning, able to perform tiptoe, and heel walking without difficulty. Tandem gait is steady. No assistive device.  DIAGNOSTIC DATA (LABS,  IMAGING, TESTING) - I reviewed patient records, labs, notes, testing and imaging myself where available.  Lab Results  Component Value Date   WBC 4.0 12/06/2016   HGB 12.8 12/06/2016   HCT 39.7 12/06/2016   MCV 86.5 12/06/2016   PLT 267 12/06/2016      Component Value Date/Time   NA 141 12/06/2016 1012   K 4.1 12/06/2016 1012   CL 101 12/06/2016 1012   CO2 30 12/06/2016 1012   GLUCOSE 84 12/06/2016 1012   BUN 13 12/06/2016 1012   CREATININE 1.02 12/06/2016 1012   CALCIUM 9.8 12/06/2016 1012   PROT 7.9 12/06/2016 1012   ALBUMIN 4.2 12/06/2016 1012   AST 18 12/06/2016 1012   ALT 9 12/06/2016 1012   ALKPHOS 51 12/06/2016 1012   BILITOT 0.9 12/06/2016 1012   GFRNONAA 61 12/06/2016 1012   GFRAA 70 12/06/2016 1012   Lab Results  Component Value Date   CHOL 245 (H) 12/06/2016   HDL 38 (L) 12/06/2016   LDLCALC 172 (H) 12/06/2016   TRIG 177 (H) 12/06/2016   CHOLHDL 6.4 (H) 12/06/2016   Lab Results  Component Value Date   HGBA1C 5.7 02/07/2017    ASSESSMENT AND PLAN 57 year old Latin American lady with 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 Left thigh and body paresthesias of unclear etiology. Remote history of hypertensive right temporal intracerebral hemorrhage in 2014. 3 episodes of brief altered consciousness  In 2016 possibly complex partial seizures, none since.She had suicidal thoughts on TROKENDI.      Continue Keppra at the current dose does not need refills Continue gabapentin 300 mg 3 times daily Continue home exercise program Healthy diet and consistent weight loss Follow-up yearly Dennie Bible, [facility], [facility], APRN  [facility] [address] [place], [place] [address] [phone]

## 2017-11-27 ENCOUNTER — Ambulatory Visit: Payer: Medicaid Other | Admitting: Nurse Practitioner

## 2017-11-27 ENCOUNTER — Encounter: Payer: Self-pay | Admitting: Nurse Practitioner

## 2017-11-27 VITALS — BP 111/77 | HR 68 | Wt 251.2 lb

## 2017-11-27 DIAGNOSIS — G44229 Chronic tension-type headache, not intractable: Secondary | ICD-10-CM

## 2017-11-27 DIAGNOSIS — I10 Essential (primary) hypertension: Secondary | ICD-10-CM | POA: Diagnosis not present

## 2017-11-27 DIAGNOSIS — G44209 Tension-type headache, unspecified, not intractable: Secondary | ICD-10-CM

## 2017-11-27 DIAGNOSIS — R200 Anesthesia of skin: Secondary | ICD-10-CM | POA: Diagnosis not present

## 2017-11-27 NOTE — Patient Instructions (Signed)
Continue Keppra at the current dose does not need refills Continue gabapentin 300 mg 3 times daily Continue home exercise program Healthy diet and consistent weight loss Follow-up yearly

## 2017-11-28 NOTE — Progress Notes (Signed)
I agree with the above plan 

## 2017-12-05 MED FILL — LEVETIRACETAM ER 500 MG TAB: 500 | 30 days supply | Qty: 30 | Fill #0

## 2017-12-05 MED FILL — AMOXICILLIN 500 MG CAPSULE: 500 | 10 days supply | Qty: 30 | Fill #0

## 2017-12-05 MED FILL — LISINOPRIL-HCTZ 20-25 MG TA: 20-25 | 30 days supply | Qty: 30 | Fill #0

## 2017-12-07 ENCOUNTER — Ambulatory Visit: Payer: Medicaid Other | Attending: Internal Medicine | Admitting: Rehabilitation

## 2017-12-07 DIAGNOSIS — M6281 Muscle weakness (generalized): Secondary | ICD-10-CM | POA: Insufficient documentation

## 2017-12-07 DIAGNOSIS — R2681 Unsteadiness on feet: Secondary | ICD-10-CM | POA: Insufficient documentation

## 2017-12-07 DIAGNOSIS — M79661 Pain in right lower leg: Secondary | ICD-10-CM | POA: Insufficient documentation

## 2017-12-07 DIAGNOSIS — M25651 Stiffness of right hip, not elsewhere classified: Secondary | ICD-10-CM | POA: Insufficient documentation

## 2017-12-07 DIAGNOSIS — M25551 Pain in right hip: Secondary | ICD-10-CM | POA: Insufficient documentation

## 2017-12-07 DIAGNOSIS — R2689 Other abnormalities of gait and mobility: Secondary | ICD-10-CM | POA: Insufficient documentation

## 2017-12-17 ENCOUNTER — Telehealth: Payer: Self-pay | Admitting: Internal Medicine

## 2017-12-17 DIAGNOSIS — R200 Anesthesia of skin: Secondary | ICD-10-CM

## 2017-12-17 MED FILL — ATORVASTATIN 40 MG TABLET: 40 | 30 days supply | Qty: 30 | Fill #5

## 2017-12-17 NOTE — Telephone Encounter (Signed)
Patient called and requested for listed medications to be refilled at [facility] pharmacy  gabapentin (NEURONTIN) 300 MG capsule [[id number]]

## 2017-12-18 MED ORDER — GABAPENTIN 300 MG PO CAPS: 600.0000 mg | ORAL_CAPSULE | Freq: Three times a day (TID) | ORAL | 0 refills | Status: DC

## 2017-12-18 MED FILL — GABAPENTIN 300 MG CAPSULE: 300 | 30 days supply | Qty: 180 | Fill #0

## 2017-12-18 NOTE — Telephone Encounter (Signed)
Refilled

## 2017-12-19 ENCOUNTER — Encounter: Payer: Self-pay | Admitting: Physical Therapy

## 2017-12-19 ENCOUNTER — Other Ambulatory Visit: Payer: Self-pay

## 2017-12-19 ENCOUNTER — Telehealth: Payer: Self-pay | Admitting: Physical Therapy

## 2017-12-19 ENCOUNTER — Ambulatory Visit: Payer: Medicaid Other | Admitting: Physical Therapy

## 2017-12-19 DIAGNOSIS — R2681 Unsteadiness on feet: Secondary | ICD-10-CM | POA: Diagnosis present

## 2017-12-19 DIAGNOSIS — M25551 Pain in right hip: Secondary | ICD-10-CM

## 2017-12-19 DIAGNOSIS — M79661 Pain in right lower leg: Secondary | ICD-10-CM

## 2017-12-19 DIAGNOSIS — M6281 Muscle weakness (generalized): Secondary | ICD-10-CM

## 2017-12-19 DIAGNOSIS — R2689 Other abnormalities of gait and mobility: Secondary | ICD-10-CM

## 2017-12-19 DIAGNOSIS — M25651 Stiffness of right hip, not elsewhere classified: Secondary | ICD-10-CM | POA: Diagnosis present

## 2017-12-19 NOTE — Telephone Encounter (Signed)
Thank you for the referral for [name]. We were able to perform her PT evaluation today. She reported during evaluation Significant pain issues, including night pain that has been waking her for 1-3 hours. Objective finding also reproduced concordant pain patterns that maybe related to an underlying condition. We have routed her PT evaluation to you for signature on plan of care. Please review PT evaluation note on Epic for details as plan of care sent for signature only contains assessment, plan & goals.   We are recommending further evaluation or imaging of right hip and femur to rule out pathology beyond the scope of PT. Please advise if additional pathologies are found so we may adjust current PT plan of care.     Thank you  Alison Ward, PT,DPT  Phone: [phone]  OR  Email: [email or web address]

## 2017-12-19 NOTE — Therapy (Signed)
[facility] [address] [place], [place], [address] Phone: [phone]   Fax:  [phone]  Physical Therapy Evaluation  Patient Details  Name: Alison Ward MRN: [id number] Date of Birth: [birth year] Referring Provider: Angelica Chessman, MD    Encounter Date: 12/19/2017  PT End of Session - 12/19/17 1411    Visit Number  1    Number of Visits  16    Date for PT Re-Evaluation  03/08/18    Authorization Type  Medicaid     PT Start Time  1100    PT Stop Time  1150    PT Time Calculation (min)  50 min    Activity Tolerance  Patient tolerated treatment well;Patient limited by pain    Behavior During Therapy  [facility] for tasks assessed/performed       Past Medical History:  Diagnosis Date  . Chronic headaches    daily, bitemporal, associated with tingling in face  . Heart murmur   . Hypertension   . Seizures ([place])    partial seizures last one on 10/22/15  On Keppra currently   . Stroke [facility]) 07/05/2013    Past Surgical History:  Procedure Laterality Date  . ABDOMINAL HYSTERECTOMY N/A 12/15/2015   Procedure: HYSTERECTOMY ABDOMINAL;  Surgeon: Emily Filbert, MD;  Location: [facility];  Service: Gynecology;  Laterality: N/A;  . CESAREAN SECTION    . ceserean section    . SALPINGOOPHORECTOMY Bilateral 12/15/2015   Procedure: SALPINGO OOPHORECTOMY;  Surgeon: Emily Filbert, MD;  Location: [facility];  Service: Gynecology;  Laterality: Bilateral;  . VENTRICULOSTOMY Left 07/07/2013   Procedure: VENTRICULOSTOMY;  Surgeon: Ophelia Charter, MD;  Location: [facility];  Service: Neurosurgery;  Laterality: Left;  Left Ventriculostomy and Removal of Right Vetriculostomy    There were no vitals filed for this visit.   Subjective Assessment - 12/19/17 1111    Subjective  Pt is a 57 y/o female who has been referred to PT by Tresa Garter, MD for L side numbness on 11/21/2017. 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 Pt has a PMH of Chronic headaches, Hypertension, seizures (last reported 10/22/2015) as well as a hysterectomy (patient reports due cysts in pelvis) in 12/15/2015. Pt reports she has been keeping her weight and BP in check since her stroke. Patient describes pain has high as 10/10 at night which wakes her after 3-4 hours sleep for 1-3 hours then goes back to sleep for 3-4 more hours. She also reports 7/10 with prolonged standing or gait during the day (day pain is more  lateral and anterior thigh where night pain is more posterolateral) . Pt reports she is unable to sleep on either side due to pain but sleeping on her back is also very painful. Pt reports that the mornings she has the least pain (3/10). She finds she needs to get up and slowly move hip/knee to have pain reduced. Pt reports her pain initiates in her Right gluts/hip and can radiate down as far as her calf but most commonly to her hamstrings, quadriceps, and groin.  Pt reports she gets minimal pain reduction with medication and reports having significant improvements in her balance and ability to complete her ADLs after working with PT in the past. Patient has reported that she has a history of falls but has not had a fall in a long time but is very afraid of another fall due  to her pain and limited mobility. Patient also reports reoccurring dizziness whenever she reaches down to pick something up an must do it slowly or just avoid bending over all together.    Pertinent History  L sided weakness, and RLE pain. 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 Falls in 2016.hysterectomy 2017    Limitations  Walking;Standing;Sitting;House hold activities    Patient Stated Goals  reduce pain and be more mobile witout pain.     Currently in Pain?  Yes    Pain Score  7  up to 7/10 during day & 10/10  at night    Pain Location  Leg groin, gluts, thigh, calf & foot    Pain Orientation  Right  R Buttocks down to her ankle    Pain Descriptors / Indicators  Radiating;Restless;Sharp;Shooting;Tender    Pain Type  Chronic pain    Pain Onset  More than a month ago    Pain Frequency  Constant    Aggravating Factors   standing, walking, standing too long in single position make Left leg numb and movement makes her have pain in the Right    Pain Relieving Factors  resting during day, pain keeps her up 2-3 hours at night. Changing positions eases pain but does not eliminate    Effect of Pain on Daily Activities  limited walking , standing ADLs         [facility] PT Assessment - 12/19/17 1100      Assessment   Medical Diagnosis  L side weakness    Referring Provider  Angelica Chessman, MD     Hand Dominance  Right    Prior Therapy  PT( 2018) felt it had significant impact      Precautions   Precautions  Fall      Restrictions   Weight Bearing Restrictions  No      Balance Screen   Has the patient fallen in the past 6 months  No    Has the patient had a decrease in activity level because of a fear of falling?   No    Is the patient reluctant to leave their home because of a fear of falling?   No      Home Environment   Living Environment  Private residence    Living Arrangements  Other relatives    Available Help at Discharge  Family    Type of [place] Access  Level entry    [place]  None      Prior Function   Level of [place] since CVA limited community activites without device      ROM / Strength   AROM / PROM / Strength  Strength      Strength   Overall Strength  Deficits    Overall Strength Comments  Bilateral weakness L>R    Strength Assessment Site  Ankle;Knee;Hip    Right/Left Hip  Right;Left    Right Hip Flexion  4-/5    Right Hip Extension  -- unable to assess due to pain appears <3/5    Right Hip  ABduction  3+/5 10/10 pain    Left Hip Flexion  4-/5    Right/Left Knee  Right;Left    Right Knee Flexion  4/5    Right Knee Extension  4/5 7/10 pain anterior proximal hip and knee  Left Knee Flexion  4-/5    Left Knee Extension  4-/5    Right/Left Ankle  Right;Left    Right Ankle Dorsiflexion  4/5    Left Ankle Dorsiflexion  4-/5      Flexibility   Soft Tissue Assessment /Muscle Length  yes    Quadriceps  Right 100* Left 90* 10/10 pain R 7/10 pain L    ITB  --      Special Tests    Special Tests  Knee Special Tests;Hip Special Tests    Hip Special Tests   Ober's Test;Other;Patrick (FABER) Test FADIR test    Knee Special tests   --      Saralyn Pilar [facility]) Test   Findings  Unable to test pt unable to get in test position due to pain    Side  Right    Comments  pt unable to get in test position  due to pain      Ober's Test   Findings  Positive    Side  Right    Comments  6/10 pain       other   Findings  Positive    Side  Right    Comments  FADIRpain 8/10 R glutes duing R hip internal rotation      other    Findings  --      Bed Mobility   Bed Mobility  Rolling Right;Supine to Sit;Sit to Supine    Rolling Right  6: Modified independent (Device/Increase time)    Supine to Sit  6: Modified independent (Device/Increase time)    Sit to Supine  6: Modified independent (Device/Increase time)      Transfers   Transfers  Sit to Stand;Stand to Sit    Sit to Stand  6: Modified independent (Device/Increase time);From chair/3-in-1;From bed;With upper extremity assist;With armrests extra time    Stand to Sit  To chair/3-in-1;To bed;With armrests;With upper extremity assist;6: Modified independent (Device/Increase time)      Ambulation/Gait   Ambulation/Gait  Yes    Ambulation/Gait Assistance  6: Modified independent (Device/Increase time)    Ambulation Distance (Feet)  100 Feet    Gait Pattern  Decreased arm swing - right;Step-through pattern;Decreased stride length;Decreased  weight shift to right;Trendelenburg;Lateral hip instability;Lateral trunk lean to right;Narrow base of support;Poor foot clearance - right;Decreased stance time - right;Antalgic L trendelenburg    Ambulation Surface  Level;Indoor    Gait velocity  3.38ft/s             Objective measurements completed on examination: See above findings.              PT Education - 12/19/17 1409    Education provided  Yes    Education Details  PT POC, red flags,  pain reduction,    Person(s) Educated  Patient    Methods  Explanation    Comprehension  Verbalized understanding;Need further instruction       PT Short Term Goals - 12/19/17 1420      PT SHORT TERM GOAL #1   Title  Pt will demonstrate & verbalize methods to reduce her pain with positions & activities. (ALL STGs TARGET 01/22/2018)    Baseline  Pain in right hip & leg radating to foot 10/10 with positioning in supine, prone & standing.     Time  3    Period  Weeks    Status  New    Target Date  01/22/18      PT SHORT TERM GOAL #  2   Title  Pt will demonstrate independence with initial HEP to reduce pain and increase LE strength    Baseline  no HEP established and patient is dependent in proper exercises with medical condition.     Time  3    Period  Weeks    Status  New    Target Date  01/22/18      PT SHORT TERM GOAL #3   Title  Pt will report a reduction in Right hip/leg pain </= 8/10 with supine positioning for 5 minutes in order to improve her sleep     Baseline  currently right leg pain 10/10 with lying supine for 5 minutes.     Time  3    Period  Weeks    Status  New    Target Date  01/22/18      PT SHORT TERM GOAL #4   Title  --    Baseline  --    Time  --    Period  --    Status  --    Target Date  --        PT Long Term Goals - 12/19/17 1423      PT LONG TERM GOAL #1   Title  pt will be independent with HEP/ ongoing fitness plan ( ALL TARGET 03/08/2018)    Baseline  No long term HEP established  and patient has medical condition requiring skilled PT instruction    Time  11    Period  Weeks    Status  New    Target Date  03/08/18      PT LONG TERM GOAL #2   Title  Patient reports 50% improvement in night pain to enable improved sleep.     Baseline  Patient reports night pain 10/10 waking her after 3-4 hours of sleep and stays awake for 1-3 hours.     Time  11    Period  Weeks    Status  New    Target Date  03/08/18      PT LONG TERM GOAL #3   Title  Pt ambulates 1000' outdoors on grass, ramps, curbs, and paved ground without device modified independent to enable community mobility.     Baseline  Patient ambulates 120' at 3.07 ft/sec with abnormal gait pattern & reports pain up to 8-10/10 when ambulating in community.     Time  11    Period  Weeks    Status  New    Target Date  03/08/18      PT LONG TERM GOAL #4   Title  Patient reports pain improvement by 50% with standing and positions during awake hours to enable ability to perform ADLs with less pain limiting her function.     Baseline  Patient reports pain 8-10/10 with standing ADLS limiting to 3-5 minutes.     Time  11    Period  Weeks    Status  New    Target Date  03/08/18      PT LONG TERM GOAL #5   Title  Patient verbalizes methods to reduce pain to improve her quality of life.     Baseline  Patient is unknowledgeable in pain reduction techniques and has high levels of pain limiting daily functions.     Time  11    Period  Weeks    Status  New    Target Date  03/08/18      Additional Long Term Goals  Additional Long Term Goals  Yes      PT LONG TERM GOAL #6   Title  Patient verbalizes increased 50% confindence in balance without fear of falling to improve activities.     Baseline  Patient reports high fear of falling with activities & reluctance to leave her home due to fear of falling.     Time  11    Period  Weeks    Status  New    Target Date  03/08/18             Plan - 12/19/17 1413     Clinical Impression Statement  Patient is a 57 y/o F who was referred to OPPT by Tresa Garter, MD for Left side numbness on 11/21/2017. Patient has also underwent a Hysterectomy in 2017 due to a mass on the Right side of her pelvis. Patient presents to PT today with c/c of Left sided numbness & weakness and right lower extremity pain. Upon further evaluation of patient's right lower extremity pain, patient has limited hip extension to -8* and positions at rest in 8* flexion supine & standing. Patient was unable to maintain prone position (hip was positioned in an extension stretch) for 5 min and demonstrated pain reduction when she placed her hip in slight abduction, lateral rotation, and flexion in effect removing her from an extension pattern. In prone position patient demonstrated significant hip extension (Iliopsoas and Rectus femoris) tightness (Right quadriceps flexibility to 100* with reported 10/10 pain, and Left 90* with 7/10 pain). Patient presents with significant muscle guarding in quadriceps, hamstrings, gluteals, and adductors. Patient is point tender to palpation on gluteals/lateral and anterior thigh. Patient also demonstrated a positive Obers test for ITB tightness, as well as pain exacerbation with both active and passive Right hip range of motion. Quick extension movements recreate her 10/10 pain. Patient was negative for clonus in Right ankle and does not present with any abnormalities of muscular tone. On evaluation of her gait, patient ambulates at 3.4ft/second (community level) with right foot adduction, narrow base of support and reduced stance phase on right leg. Based on objective findings, patient history, and subjective pain reports (7-10/10 pain during evaluation, as well as night pain (10/10) that is keeping her up at night for as long as 1-3 hours, patient stands to significantly benefit from PT for pain reduction techniques, LE strengthening, and mobility training to reduce  patient falls risk and fear of falls. Patient presents with significant red flags for possible pathology occurring at her Right hip/ femur (concerns for fracture, cancer, or osteomyelitis). PT is requesting or recommending more specific imaging be conducted on patient Right hip and femur to rule out pathology outside the scope of PT treatment.    History and Personal Factors relevant to plan of care:  RLE pain and L sided weakness, Chronic headaches, Hypertension , seizures,(last reported 10/22/2015) stroke right temporal parenchymal intracerebral hemorrhage  (07/04/2013). Falls in 2016 hysterectomy 2017    Clinical Presentation  Unstable    Clinical Presentation due to:  Red flags, pain levels, pain discription and exaserbation patterns    Clinical Decision Making  High    Rehab Potential  Fair    Clinical Impairments Affecting Rehab Potential  Positive additude, pain levels    PT Frequency  Other (comment) 1x week 3 weeks then 2x week 6wk    PT Duration  Other (comment) 1x week 3 weeks then 2x week 6wk    PT Treatment/Interventions  Therapeutic activities;Therapeutic exercise;Balance  training;Neuromuscular re-education;Patient/family education;Functional mobility training;ADLs/Self Care Home Management;Passive range of motion;Energy conservation;Gait training    PT Next Visit Plan  complete [place] balance assessmen as well as FGA and establish HEP exercises to reduce pain     Consulted and Agree with Plan of Care  Patient       Patient will benefit from skilled therapeutic intervention in order to improve the following deficits and impairments:  Abnormal gait, Decreased range of motion, Difficulty walking, Dizziness, Decreased activity tolerance, Decreased balance, Decreased strength, Decreased mobility, Pain  Visit Diagnosis: Other abnormalities of gait and mobility  Pain in right hip  Unsteadiness on feet  Muscle weakness (generalized)  Pain in right lower leg  Stiffness of right hip, not  elsewhere classified     Problem List Patient Active Problem List   Diagnosis Date Noted  . Ganglion cyst of right foot 07/04/2017  . Chronic tension-type headache, not intractable 12/30/2015  . Post-operative state 12/15/2015  . Simple partial seizure disorder ([place]) 11/04/2015  . Left sided numbness 09/25/2015  . Acute kidney injury ([place]) 09/25/2015  . Leukopenia 09/25/2015  . Tension vascular headache 09/07/2015  . Meralgia paresthetica of left side 09/07/2015  . Fainting spell 09/07/2015  . Prediabetes 09/02/2015  . Stye 08/06/2014  . Essential hypertension, benign 04/16/2014  . CVA (cerebral vascular accident) ([place]) 04/16/2014  . Headache 04/16/2014  . Tension headache 03/06/2014  . Obesity, unspecified 08/13/2013  . 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 ICH (intracerebral hemorrhage) ([place]) 07/21/2013  . Hypokalemia 07/21/2013  . Intracerebral hemorrhage ([place]) 07/06/2013  . Essential hypertension 02/24/2009   Waunita Schooner, SPT 12/19/2017, 3:44 PM  Jamey Reas PT, DPT 12/19/2017, 5:12 PM  [facility] [address] [place], [place], [address] Phone: [phone]   Fax:  [phone]  Name: Alison Ward MRN: [id number] Date of Birth: 09/28/1961

## 2017-12-24 ENCOUNTER — Other Ambulatory Visit: Payer: Self-pay | Admitting: Internal Medicine

## 2017-12-24 DIAGNOSIS — M25551 Pain in right hip: Secondary | ICD-10-CM

## 2017-12-24 NOTE — Telephone Encounter (Signed)
Thank you for this detailed note. X ray Right Hip joint has been ordered. Will evaluate further as needed.

## 2018-01-01 ENCOUNTER — Other Ambulatory Visit: Payer: Self-pay | Admitting: *Deleted

## 2018-01-01 ENCOUNTER — Ambulatory Visit (HOSPITAL_COMMUNITY)
Admission: RE | Admit: 2018-01-01 | Discharge: 2018-01-01 | Disposition: A | Discharge status: Home / Self Care | Payer: Medicaid Other | Source: Ambulatory Visit | Attending: Internal Medicine | Admitting: Internal Medicine

## 2018-01-01 DIAGNOSIS — R269 Unspecified abnormalities of gait and mobility: Secondary | ICD-10-CM | POA: Insufficient documentation

## 2018-01-01 DIAGNOSIS — M47896 Other spondylosis, lumbar region: Secondary | ICD-10-CM | POA: Diagnosis not present

## 2018-01-01 DIAGNOSIS — M25551 Pain in right hip: Secondary | ICD-10-CM | POA: Insufficient documentation

## 2018-01-02 ENCOUNTER — Ambulatory Visit: Payer: Medicaid Other | Admitting: Physical Therapy

## 2018-01-03 MED FILL — LISINOPRIL-HCTZ 20-25 MG TA: 20-25 | 30 days supply | Qty: 30 | Fill #1

## 2018-01-03 MED FILL — LEVETIRACETAM ER 500 MG TAB: 500 | 30 days supply | Qty: 30 | Fill #1

## 2018-01-04 ENCOUNTER — Ambulatory Visit: Payer: Medicaid Other | Attending: Internal Medicine

## 2018-01-04 ENCOUNTER — Telehealth: Payer: Self-pay | Admitting: Internal Medicine

## 2018-01-04 DIAGNOSIS — R2681 Unsteadiness on feet: Secondary | ICD-10-CM | POA: Insufficient documentation

## 2018-01-04 DIAGNOSIS — M79661 Pain in right lower leg: Secondary | ICD-10-CM | POA: Insufficient documentation

## 2018-01-04 DIAGNOSIS — R2689 Other abnormalities of gait and mobility: Secondary | ICD-10-CM | POA: Diagnosis present

## 2018-01-04 DIAGNOSIS — M6281 Muscle weakness (generalized): Secondary | ICD-10-CM | POA: Diagnosis present

## 2018-01-04 DIAGNOSIS — M25651 Stiffness of right hip, not elsewhere classified: Secondary | ICD-10-CM | POA: Diagnosis present

## 2018-01-04 DIAGNOSIS — M25551 Pain in right hip: Secondary | ICD-10-CM

## 2018-01-04 NOTE — Telephone Encounter (Signed)
Please review hip xray completed in 01/01/18 if able, patient has called an requested results.

## 2018-01-04 NOTE — Patient Instructions (Signed)
Piriformis Stretch - Supine    Pull right knee across body toward opposite shoulder. Hold slight stretch for _30__ seconds.  Repeat __3_ times. Do __2_ times per day.  Copyright  VHI. All rights reserved.    Double Knee to Chest (Flexion)    Gently pull both knees toward chest. Feel stretch in lower back or buttock area. Breathing deeply, and rock back and forth for 20-30 seconds. Repeat ___1-2_ times. Do __2__ sessions per day.  http://gt2.exer.us/228   Copyright  VHI. All rights reserved.

## 2018-01-04 NOTE — Telephone Encounter (Signed)
Pt called to request the result for her hipp x-ray, please follow up

## 2018-01-04 NOTE — Therapy (Signed)
[facility] [address] [place], [place], [address] Phone: [phone]   Fax:  [phone]  Physical Therapy Treatment  Patient Details  Name: Alison Ward MRN: [id number] Date of Birth: 05-15-1961 Referring Provider: Angelica Chessman, MD    Encounter Date: 01/04/2018  PT End of Session - 01/04/18 0930    Visit Number  2    Number of Visits  16    Date for PT Re-Evaluation  03/08/18    Authorization Type  Medicaid     Authorization - Visit Number  1    Authorization - Number of Visits  3    PT Start Time  0848    PT Stop Time  0928    PT Time Calculation (min)  40 min    Equipment Utilized During Treatment  Gait belt    Activity Tolerance  Patient tolerated treatment well    Behavior During Therapy  [facility] for tasks assessed/performed       Past Medical History:  Diagnosis Date  . Chronic headaches    daily, bitemporal, associated with tingling in face  . Heart murmur   . Hypertension   . Seizures ([place])    partial seizures last one on 10/22/15  On Keppra currently   . Stroke [facility]) 07/05/2013    Past Surgical History:  Procedure Laterality Date  . ABDOMINAL HYSTERECTOMY N/A 12/15/2015   Procedure: HYSTERECTOMY ABDOMINAL;  Surgeon: Emily Filbert, MD;  Location: [facility];  Service: Gynecology;  Laterality: N/A;  . CESAREAN SECTION    . ceserean section    . SALPINGOOPHORECTOMY Bilateral 12/15/2015   Procedure: SALPINGO OOPHORECTOMY;  Surgeon: Emily Filbert, MD;  Location: [facility];  Service: Gynecology;  Laterality: Bilateral;  . VENTRICULOSTOMY Left 07/07/2013   Procedure: VENTRICULOSTOMY;  Surgeon: Ophelia Charter, MD;  Location: [facility];  Service: Neurosurgery;  Laterality: Left;  Left Ventriculostomy and Removal of Right Vetriculostomy    There were no vitals filed for this visit.  Subjective Assessment - 01/04/18 0852    Subjective  Pt denied falls since last visit. Pt stated her mom had surgery on  Wednesday but is doing well. Pt's R hip/LE pain is worse at night. Pt's primary PT stated pt had imaging and is cleared for PT. Pt reported balance is "a little off sometimes."    Pertinent History  L sided weakness, and RLE pain. 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 Falls in 2016.hysterectomy 2017    Patient Stated Goals  reduce pain and be more mobile witout pain.     Currently in Pain?  Yes    Pain Score  3     Pain Location  Hip    Pain Orientation  Right    Pain Descriptors / Indicators  Sharp;Constant    Pain Type  Chronic pain    Pain Radiating Towards  RLE    Pain Onset  More than a month ago    Aggravating Factors   standing, walking    Pain Relieving Factors  resting         [facility] PT Assessment - 01/04/18 0855      Functional Gait  Assessment   Gait assessed   Yes    Gait Level Surface  Walks 20 ft, slow speed, abnormal gait pattern, evidence for imbalance or deviates 10-15 in outside of the 12 in walkway width. Requires more than 7 sec to ambulate 20 ft.  7.23 sec.    Change in Gait Speed  Able to change speed, demonstrates mild gait deviations, deviates 6-10 in outside of the 12 in walkway width, or no gait deviations, unable to achieve a major change in velocity, or uses a change in velocity, or uses an assistive device.    Gait with Horizontal Head Turns  Performs head turns smoothly with slight change in gait velocity (eg, minor disruption to smooth gait path), deviates 6-10 in outside 12 in walkway width, or uses an assistive device.    Gait with Vertical Head Turns  Performs task with slight change in gait velocity (eg, minor disruption to smooth gait path), deviates 6 - 10 in outside 12 in walkway width or uses assistive device    Gait and Pivot Turn  Turns slowly, requires verbal cueing, or requires several small steps to catch balance following turn and stop    Step  Over Obstacle  Is able to step over one shoe box (4.5 in total height) without changing gait speed. No evidence of imbalance.    Gait with Narrow Base of Support  Is able to ambulate for 10 steps heel to toe with no staggering.    Gait with Eyes Closed  Walks 20 ft, uses assistive device, slower speed, mild gait deviations, deviates 6-10 in outside 12 in walkway width. Ambulates 20 ft in less than 9 sec but greater than 7 sec.    Ambulating Backwards  Walks 20 ft, uses assistive device, slower speed, mild gait deviations, deviates 6-10 in outside 12 in walkway width.    Steps  Alternating feet, must use rail. Rail to descent steps    Total Score  19    FGA comment:  19/30: indicates pt is at moderate risk for falls.                   [facility] Adult PT Treatment/Exercise - 01/04/18 0905      Standardized Balance Assessment   Standardized Balance Assessment  Berg Balance Test      Berg Balance Test   Sit to Stand  Able to stand without using hands and stabilize independently    Standing Unsupported  Able to stand safely 2 minutes    Sitting with Back Unsupported but Feet Supported on Floor or Stool  Able to sit safely and securely 2 minutes    Stand to Sit  Sits safely with minimal use of hands    Transfers  Able to transfer safely, minor use of hands    Standing Unsupported with Eyes Closed  Able to stand 10 seconds safely    Standing Ubsupported with Feet Together  Able to place feet together independently and stand 1 minute safely    From Standing, Reach Forward with Outstretched Arm  Can reach confidently >25 cm (10") RUE    From Standing Position, Pick up Object from Floor  Able to pick up shoe safely and easily    From Standing Position, Turn to Look Behind Over each Shoulder  Looks behind one side only/other side shows less weight shift    Turn 360 Degrees  Able to turn 360 degrees safely one side only in 4 seconds or less    Standing Unsupported, Alternately Place Feet on  Step/Stool  Able to complete 4 steps without aid or supervision incr. lateral sway after 4 steps    Standing Unsupported, One Foot in [place] to place foot tandem independently and hold 30 seconds  Standing on One Leg  Able to lift leg independently and hold > 10 seconds    Total Score  52        Therex: Pt performed hip stretches in supine with cues and demo for technique. Pt unable to perform hip flexor stretch 2/2 pain. Please see pt instructions for HEP details.      PT Education - 01/04/18 0930    Education provided  Yes    Education Details  Discussed outcome measure results and provided pt with stretching HEP.     Person(s) Educated  Patient    Methods  Explanation;Demonstration;Tactile cues;Verbal cues;Handout    Comprehension  Returned demonstration;Verbalized understanding;Need further instruction       PT Short Term Goals - 12/19/17 1420      PT SHORT TERM GOAL #1   Title  Pt will demonstrate & verbalize methods to reduce her pain with positions & activities. (ALL STGs TARGET 01/22/2018)    Baseline  Pain in right hip & leg radating to foot 10/10 with positioning in supine, prone & standing.     Time  3    Period  Weeks    Status  New    Target Date  01/22/18      PT SHORT TERM GOAL #2   Title  Pt will demonstrate independence with initial HEP to reduce pain and increase LE strength    Baseline  no HEP established and patient is dependent in proper exercises with medical condition.     Time  3    Period  Weeks    Status  New    Target Date  01/22/18      PT SHORT TERM GOAL #3   Title  Pt will report a reduction in Right hip/leg pain </= 8/10 with supine positioning for 5 minutes in order to improve her sleep     Baseline  currently right leg pain 10/10 with lying supine for 5 minutes.     Time  3    Period  Weeks    Status  New    Target Date  01/22/18      PT SHORT TERM GOAL #4   Title  --    Baseline  --    Time  --    Period  --    Status  --     Target Date  --        PT Long Term Goals - 01/04/18 0932      PT LONG TERM GOAL #1   Title  pt will be independent with HEP/ ongoing fitness plan ( ALL TARGET 03/08/2018)    Baseline  No long term HEP established and patient has medical condition requiring skilled PT instruction    Time  11    Period  Weeks    Status  New      PT LONG TERM GOAL #2   Title  Patient reports 50% improvement in night pain to enable improved sleep.     Baseline  Patient reports night pain 10/10 waking her after 3-4 hours of sleep and stays awake for 1-3 hours.     Time  11    Period  Weeks    Status  New      PT LONG TERM GOAL #3   Title  Pt ambulates 1000' outdoors on grass, ramps, curbs, and paved ground without device modified independent to enable community mobility.     Baseline  Patient ambulates 120' at 3.07 ft/sec  with abnormal gait pattern & reports pain up to 8-10/10 when ambulating in community.     Time  11    Period  Weeks    Status  New      PT LONG TERM GOAL #4   Title  Patient reports pain improvement by 50% with standing and positions during awake hours to enable ability to perform ADLs with less pain limiting her function.     Baseline  Patient reports pain 8-10/10 with standing ADLS limiting to 3-5 minutes.     Time  11    Period  Weeks    Status  New      PT LONG TERM GOAL #5   Title  Patient verbalizes methods to reduce pain to improve her quality of life.     Baseline  Patient is unknowledgeable in pain reduction techniques and has high levels of pain limiting daily functions.     Time  11    Period  Weeks    Status  New      Additional Long Term Goals   Additional Long Term Goals  Yes      PT LONG TERM GOAL #6   Title  Patient verbalizes increased 50% confindence in balance without fear of falling to improve activities.     Baseline  Patient reports high fear of falling with activities & reluctance to leave her home due to fear of falling.     Time  11    Period   Weeks    Status  New      PT LONG TERM GOAL #7   Title  Pt will improve FGA score to 27/30 to decr. falls risk.     Baseline  19/30    Status  New            Plan - 01/04/18 0931    Clinical Impression Statement  Pt's BERG score indicates pt is at low falls risk. However, pt's FGA score indicates pt is at moderate falls risk. Pt demonstrated progress as she reported R hip/LE pain decr. to 2/10 after performing stretches. Pt would continue to benefit from skilled PT to improve pain and safety during functional mobility.     Rehab Potential  Fair    Clinical Impairments Affecting Rehab Potential  Positive additude, pain levels    PT Frequency  Other (comment) 1x week 3 weeks then 2x week 6wk    PT Duration  Other (comment) 1x week 3 weeks then 2x week 6wk    PT Treatment/Interventions  Therapeutic activities;Therapeutic exercise;Balance training;Neuromuscular re-education;Patient/family education;Functional mobility training;ADLs/Self Care Home Management;Passive range of motion;Energy conservation;Gait training    PT Next Visit Plan  Continue to establish HEP exercises to reduce pain     Consulted and Agree with Plan of Care  Patient       Patient will benefit from skilled therapeutic intervention in order to improve the following deficits and impairments:  Abnormal gait, Decreased range of motion, Difficulty walking, Dizziness, Decreased activity tolerance, Decreased balance, Decreased strength, Decreased mobility, Pain  Visit Diagnosis: Other abnormalities of gait and mobility  Pain in right hip  Unsteadiness on feet     Problem List Patient Active Problem List   Diagnosis Date Noted  . Ganglion cyst of right foot 07/04/2017  . Chronic tension-type headache, not intractable 12/30/2015  . Post-operative state 12/15/2015  . Simple partial seizure disorder ([place]) 11/04/2015  . Left sided numbness 09/25/2015  . Acute kidney injury ([place]) 09/25/2015  .  Leukopenia 09/25/2015   . Tension vascular headache 09/07/2015  . Meralgia paresthetica of left side 09/07/2015  . Fainting spell 09/07/2015  . Prediabetes 09/02/2015  . Stye 08/06/2014  . Essential hypertension, benign 04/16/2014  . CVA (cerebral vascular accident) ([place]) 04/16/2014  . Headache 04/16/2014  . Tension headache 03/06/2014  . Obesity, unspecified 08/13/2013  . 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 ICH (intracerebral hemorrhage) ([place]) 07/21/2013  . Hypokalemia 07/21/2013  . Intracerebral hemorrhage ([place]) 07/06/2013  . Essential hypertension 02/24/2009    Dawne Casali L 01/04/2018, 9:33 AM  [facility] [address] [place], [place], [address] Phone: [phone]   Fax:  [phone]  Name: ARNELLE NALE MRN: [id number] Date of Birth: 1961/08/22  Geoffry Paradise, PT,DPT 01/04/18 9:34 AM Phone: [phone] Fax: [phone]

## 2018-01-04 NOTE — Telephone Encounter (Signed)
Patient will be contacted once covering provider reviews results from 01/01/18 imaging.

## 2018-01-07 NOTE — Telephone Encounter (Signed)
X-ray reveals degenerative changes in the lumbar spine, inflammation in one of the left pelvic bones.  This can be treated with the use of NSAIDs.

## 2018-01-08 NOTE — Telephone Encounter (Signed)
Pt aware of message:Ibuprofen--> Aleve, Motrin, or Advil would help along with Tylenol. Pt states she takes Tylenol. She has appointment with Dr. Doreene Burke later this month.

## 2018-01-09 ENCOUNTER — Ambulatory Visit: Payer: Medicaid Other | Admitting: Internal Medicine

## 2018-01-10 ENCOUNTER — Ambulatory Visit: Payer: Medicaid Other

## 2018-01-10 DIAGNOSIS — R2689 Other abnormalities of gait and mobility: Secondary | ICD-10-CM | POA: Diagnosis not present

## 2018-01-10 DIAGNOSIS — M79661 Pain in right lower leg: Secondary | ICD-10-CM

## 2018-01-10 DIAGNOSIS — M25551 Pain in right hip: Secondary | ICD-10-CM

## 2018-01-10 DIAGNOSIS — M25651 Stiffness of right hip, not elsewhere classified: Secondary | ICD-10-CM

## 2018-01-10 DIAGNOSIS — R2681 Unsteadiness on feet: Secondary | ICD-10-CM

## 2018-01-10 DIAGNOSIS — M6281 Muscle weakness (generalized): Secondary | ICD-10-CM

## 2018-01-10 NOTE — Patient Instructions (Signed)
SIT TO STAND: Foam    Feet: shoulder-width on foam. Lean chest forward AND BREATH OUT . Raise hips and straighten knees to stand BREATHING IN __3_ reps per set, _4__ sets per day, _3__ days per week Hold onto a support.  Copyright  VHI. All rights reserved.  PELVIC TILT: Posterior    Tighten abdominals, flatten low back. Hold for 5 seconds 5 times breathing normally  3 x day    Copyright  VHI. All rights reserved.

## 2018-01-10 NOTE — Therapy (Addendum)
[facility] [address] [place], [place], [address] Phone: [phone]   Fax:  [phone]  Physical Therapy Treatment  Patient Details  Name: Alison Ward MRN: [id number] Date of Birth: November 12, 1960 Referring Provider: Angelica Chessman, MD    Encounter Date: 01/10/2018  PT End of Session - 01/10/18 1219    Visit Number  3    Number of Visits  16    Date for PT Re-Evaluation  03/08/18    Authorization Type  Medicaid     Authorization - Visit Number  2    Authorization - Number of Visits  3    PT Start Time  0845    PT Stop Time  0930    PT Time Calculation (min)  45 min    Equipment Utilized During Treatment  Gait belt    Activity Tolerance  Patient tolerated treatment well;Patient limited by pain    Behavior During Therapy  [facility] for tasks assessed/performed       Past Medical History:  Diagnosis Date  . Chronic headaches    daily, bitemporal, associated with tingling in face  . Heart murmur   . Hypertension   . Seizures ([place])    partial seizures last one on 10/22/15  On Keppra currently   . Stroke [facility]) 07/05/2013    Past Surgical History:  Procedure Laterality Date  . ABDOMINAL HYSTERECTOMY N/A 12/15/2015   Procedure: HYSTERECTOMY ABDOMINAL;  Surgeon: Emily Filbert, MD;  Location: [facility];  Service: Gynecology;  Laterality: N/A;  . CESAREAN SECTION    . ceserean section    . SALPINGOOPHORECTOMY Bilateral 12/15/2015   Procedure: SALPINGO OOPHORECTOMY;  Surgeon: Emily Filbert, MD;  Location: [facility];  Service: Gynecology;  Laterality: Bilateral;  . VENTRICULOSTOMY Left 07/07/2013   Procedure: VENTRICULOSTOMY;  Surgeon: Ophelia Charter, MD;  Location: [facility];  Service: Neurosurgery;  Laterality: Left;  Left Ventriculostomy and Removal of Right Vetriculostomy    There were no vitals filed for this visit.   Subjective Assessment - 01/10/18 0851    Subjective  pt reports 10+ pain this morning at 2am she  need 2x 650mg  tylenol to reduce pain and return to sleep.  pt reports no falls since last PT session. Pt reports HEP exercises given today have reduced her Pain from a 7/10 to a 3 /10 at the end of the session.     Pertinent History  L sided weakness, and RLE pain. 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 Falls in 2016.hysterectomy 2017    Limitations  Walking;Standing;Sitting;House hold activities    Patient Stated Goals  reduce pain and be more mobile witout pain.     Currently in Pain?  Yes    Pain Score  7     Pain Location  Hip    Pain Orientation  Right    Pain Descriptors / Indicators  Sharp;Constant    Pain Type  Chronic pain    Pain Onset  More than a month ago    Aggravating Factors   starting movements at hip     Pain Relieving Factors  walking     Effect of Pain on Daily Activities  limited ADLs                         [facility] Adult PT Treatment/Exercise - 01/10/18 0853      Transfers   Transfers  Sit to Stand;Stand to Sit    Sit to Stand  6: Modified independent (Device/Increase time);From chair/3-in-1;From bed;With upper extremity assist;With armrests    Stand to Sit  6: Modified independent (Device/Increase time);Without upper extremity assist;To bed      Exercises   Exercises  Lumbar;Other Exercises    Other Exercises   see HEP for others       Lumbar Exercises: Supine   Bridge  Other (comment) 3reps 3 sets      Cues and demo for technique during all therex. Please see pt instructions for HEP details.  Pt required incr. Time for cues to ensure proper technique and to allow pain to subside.       [facility]: PT Education - 01/10/18 0927    Education provided  Yes    Education Details  HEP, pain management with movement    Person(s) Educated  Patient    Methods  Explanation;Demonstration    Comprehension  Verbalized understanding       PT  Short Term Goals - 12/19/17 1420      PT SHORT TERM GOAL #1   Title  Pt will demonstrate & verbalize methods to reduce her pain with positions & activities. (ALL STGs TARGET 01/22/2018)    Baseline  Pain in right hip & leg radating to foot 10/10 with positioning in supine, prone & standing.     Time  3    Period  Weeks    Status  New    Target Date  01/22/18      PT SHORT TERM GOAL #2   Title  Pt will demonstrate independence with initial HEP to reduce pain and increase LE strength    Baseline  no HEP established and patient is dependent in proper exercises with medical condition.     Time  3    Period  Weeks    Status  New    Target Date  01/22/18      PT SHORT TERM GOAL #3   Title  Pt will report a reduction in Right hip/leg pain </= 8/10 with supine positioning for 5 minutes in order to improve her sleep     Baseline  currently right leg pain 10/10 with lying supine for 5 minutes.     Time  3    Period  Weeks    Status  New    Target Date  01/22/18      PT SHORT TERM GOAL #4   Title  --    Baseline  --    Time  --    Period  --    Status  --    Target Date  --        PT Long Term Goals - 01/04/18 0932      PT LONG TERM GOAL #1   Title  pt will be independent with HEP/ ongoing fitness plan ( ALL TARGET 03/08/2018)    Baseline  No long term HEP established and patient has medical condition requiring skilled PT instruction    Time  11    Period  Weeks    Status  New      PT LONG TERM GOAL #2   Title  Patient reports 50% improvement in night pain to enable improved sleep.     Baseline  Patient reports night pain 10/10 waking her after 3-4 hours of sleep and stays awake for 1-3 hours.     Time  11    Period  Weeks  Status  New      PT LONG TERM GOAL #3   Title  Pt ambulates 1000' outdoors on grass, ramps, curbs, and paved ground without device modified independent to enable community mobility.     Baseline  Patient ambulates 120' at 3.07 ft/sec with abnormal gait  pattern & reports pain up to 8-10/10 when ambulating in community.     Time  11    Period  Weeks    Status  New      PT LONG TERM GOAL #4   Title  Patient reports pain improvement by 50% with standing and positions during awake hours to enable ability to perform ADLs with less pain limiting her function.     Baseline  Patient reports pain 8-10/10 with standing ADLS limiting to 3-5 minutes.     Time  11    Period  Weeks    Status  New      PT LONG TERM GOAL #5   Title  Patient verbalizes methods to reduce pain to improve her quality of life.     Baseline  Patient is unknowledgeable in pain reduction techniques and has high levels of pain limiting daily functions.     Time  11    Period  Weeks    Status  New      Additional Long Term Goals   Additional Long Term Goals  Yes      PT LONG TERM GOAL #6   Title  Patient verbalizes increased 50% confindence in balance without fear of falling to improve activities.     Baseline  Patient reports high fear of falling with activities & reluctance to leave her home due to fear of falling.     Time  11    Period  Weeks    Status  New      PT LONG TERM GOAL #7   Title  Pt will improve FGA score to 27/30 to decr. falls risk.     Baseline  19/30    Status  New           Plan - 01/10/18 1227    Clinical Impression Statement  During today's PT session PT and pt reviewed and advanced HEP.  Sit to stand with breathing technique was found to be very effective with reduction of patients R hip pain ( 7/10 down to a 3/10). Patient continues to respond positively to weight bearing (closed kinetic chain movements) and PT educated pt to utilize movement in order to reduce her pain in the morning and thought the day. Pt will continue to benefit from skilled PT intervention to address issues noted above.     Rehab Potential  Fair    Clinical Impairments Affecting Rehab Potential  Positive additude, pain levels    PT Frequency  Other (comment)    PT  Duration  Other (comment)    PT Treatment/Interventions  Therapeutic activities;Therapeutic exercise;Balance training;Neuromuscular re-education;Patient/family education;Functional mobility training;ADLs/Self Care Home Management;Passive range of motion;Energy conservation;Gait training    PT Next Visit Plan  Continue to progress LE closed chain exercises to conitnue to address her pain. Check STGs and send for renewal.    Consulted and Agree with Plan of Care  Patient          Patient will benefit from skilled therapeutic intervention in order to improve the following deficits and impairments:  Abnormal gait, Decreased range of motion, Difficulty walking, Dizziness, Decreased activity tolerance, Decreased balance, Decreased strength, Decreased mobility, Pain  Visit Diagnosis: Other abnormalities of gait and mobility  Pain in right hip  Unsteadiness on feet  Muscle weakness (generalized)  Pain in right lower leg  Stiffness of right hip, not elsewhere classified     Problem List Patient Active Problem List   Diagnosis Date Noted  . Ganglion cyst of right foot 07/04/2017  . Chronic tension-type headache, not intractable 12/30/2015  . Post-operative state 12/15/2015  . Simple partial seizure disorder ([place]) 11/04/2015  . Left sided numbness 09/25/2015  . Acute kidney injury ([place]) 09/25/2015  . Leukopenia 09/25/2015  . Tension vascular headache 09/07/2015  . Meralgia paresthetica of left side 09/07/2015  . Fainting spell 09/07/2015  . Prediabetes 09/02/2015  . Stye 08/06/2014  . Essential hypertension, benign 04/16/2014  . CVA (cerebral vascular accident) ([place]) 04/16/2014  . Headache 04/16/2014  . Tension headache 03/06/2014  . Obesity, unspecified 08/13/2013  . 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 ICH (intracerebral hemorrhage) ([place]) 07/21/2013  . Hypokalemia 07/21/2013  . Intracerebral hemorrhage ([place]) 07/06/2013  . Essential hypertension 02/24/2009     Waunita Schooner SPT 01/10/2018, 12:29 PM  [facility] [address] [place], [place], [address] Phone: [phone]   Fax:  [phone]  Name: CLIFTON KOVACIC MRN: [id number] Date of Birth: 31-Dec-1960

## 2018-01-11 ENCOUNTER — Telehealth: Payer: Self-pay | Admitting: Internal Medicine

## 2018-01-11 NOTE — Telephone Encounter (Signed)
Please ask patient to contact the PT office and request to be advised of what to do regarding the pain after she leaves PT.

## 2018-01-11 NOTE — Telephone Encounter (Signed)
Called pt. Back and LVM stating that per Nurse to call the PT so that they can advise her on what to do for her pain.

## 2018-01-11 NOTE — Telephone Encounter (Signed)
Pt. Called requesting to speak with the nurse regarding her right leg pain. Pt. States that she started going to physical therapy and every time she finishes the exercise  the pain on her leg gets worse. Please f/u with pt.

## 2018-01-17 MED FILL — ATORVASTATIN 40 MG TABLET: 40 | 30 days supply | Qty: 30 | Fill #6

## 2018-01-22 ENCOUNTER — Encounter: Payer: Self-pay | Admitting: Physical Therapy

## 2018-01-22 ENCOUNTER — Ambulatory Visit: Payer: Medicaid Other | Admitting: Physical Therapy

## 2018-01-22 DIAGNOSIS — R2681 Unsteadiness on feet: Secondary | ICD-10-CM

## 2018-01-22 DIAGNOSIS — R2689 Other abnormalities of gait and mobility: Secondary | ICD-10-CM | POA: Diagnosis not present

## 2018-01-22 DIAGNOSIS — M79661 Pain in right lower leg: Secondary | ICD-10-CM

## 2018-01-22 DIAGNOSIS — M6281 Muscle weakness (generalized): Secondary | ICD-10-CM

## 2018-01-22 DIAGNOSIS — M25551 Pain in right hip: Secondary | ICD-10-CM

## 2018-01-22 NOTE — Patient Instructions (Signed)
Log Roll    Lying on back, bend left knee and place left arm across chest. Roll all in one movement to the right. Reverse to roll to the left. Always move as one unit.   Copyright  VHI. All rights reserved.  HIP: External Rotation    Sit at edge of surface. Cross one leg over other knee. Press down gently on knee. Hold __20_ seconds. ___3 reps per set, _2__ sets per day, _3__ days per week  Copyright  VHI. All rights reserved.

## 2018-01-22 NOTE — Therapy (Signed)
[facility] [address] [place], [place], [address] Phone: [phone]   Fax:  [phone]  Physical Therapy Treatment  Patient Details  Name: Alison Ward MRN: [id number] Date of Birth: [birth year] Referring Provider: Angelica Chessman, MD    Encounter Date: 01/22/2018  PT End of Session - 01/22/18 1213    Visit Number  4    Number of Visits  16    Date for PT Re-Evaluation  03/08/18    Authorization Type  Medicaid     Authorization - Visit Number  3    Authorization - Number of Visits  3    PT Start Time  0930    PT Stop Time  1023    PT Time Calculation (min)  53 min    Activity Tolerance  Patient tolerated treatment well;Patient limited by pain    Behavior During Therapy  [facility] for tasks assessed/performed       Past Medical History:  Diagnosis Date  . Chronic headaches    daily, bitemporal, associated with tingling in face  . Heart murmur   . Hypertension   . Seizures ([place])    partial seizures last one on 10/22/15  On Keppra currently   . Stroke [facility]) 07/05/2013    Past Surgical History:  Procedure Laterality Date  . ABDOMINAL HYSTERECTOMY N/A 12/15/2015   Procedure: HYSTERECTOMY ABDOMINAL;  Surgeon: Emily Filbert, MD;  Location: [facility];  Service: Gynecology;  Laterality: N/A;  . CESAREAN SECTION    . ceserean section    . SALPINGOOPHORECTOMY Bilateral 12/15/2015   Procedure: SALPINGO OOPHORECTOMY;  Surgeon: Emily Filbert, MD;  Location: [facility];  Service: Gynecology;  Laterality: Bilateral;  . VENTRICULOSTOMY Left 07/07/2013   Procedure: VENTRICULOSTOMY;  Surgeon: Ophelia Charter, MD;  Location: [facility];  Service: Neurosurgery;  Laterality: Left;  Left Ventriculostomy and Removal of Right Vetriculostomy    There were no vitals filed for this visit.  Subjective Assessment - 01/22/18 0933    Subjective  pt reports pain has significantly increased over time since last PT session. pt reports she is  trying to contact her PCP for an increase in medication in order to reduce night pain. pt reports pain is greatest at night when it wakes her up (usually around 2am). She reports when pain is very high at night there is not much she can do to reduce it other than sitting in her chair. She also reports that her HEP exercises have helped her get through the day without pain and she has been able to do all the work related activities she needs to with minimal to no pain. Patient reports she lays down for bed around 8pm and lays in on her side reading until around 11pm when she falls asleep.     Pertinent History  L sided weakness, and RLE pain. 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 Falls in 2016.hysterectomy 2017    Limitations  Walking;Standing;Sitting;House hold activities    Patient Stated Goals  reduce pain and be more mobile witout pain.     Currently in Pain?  Yes    Pain Score  5  10/10 at night around 2am     Pain Location  Hip    Pain Orientation  Right    Pain Descriptors / Indicators  Sharp;Shooting    Pain Type  Chronic pain    Pain Onset  More than a month ago  Pain Frequency  Constant significant incrase at night                No data recorded       [facility] Adult PT Treatment/Exercise - 01/22/18 0933      Bed Mobility   Bed Mobility  Rolling Right;Supine to Sit;Sit to Supine    Rolling Right  5: Supervision    Rolling Right Details (indicate cue type and reason)  verbal cues to bend knees so feet on bed with rolling    Supine to Sit  5: Supervision    Supine to Sit Details (indicate cue type and reason)  PT demo, verbal cues instruction in technique to protect back via sidelying and using UEs to push up. Pt return demo understanding via both right & left sidelying.     Sit to Supine  5: Supervision    Sit to Supine - Details (indicate cue type and reason)  PT  demo, instructed in position/technique with trunk via sidelying. Pt return demo understanding both to right & to left.       Transfers   Transfers  Sit to Stand;Stand to Sit      Self-Care   Self-Care  Posture;Heat/Ice Application    Posture  Log rolling with knees flexed not extended, pillow position in bed for LE support, tenting bed sheets to reduce pull on LEs, pillow placement in side lying to reduce pressure on hip.     Heat/Ice Application  IT band rolling with frozen water bottle      Exercises   Exercises  Other Exercises    Other Exercises   review HEP (PT demo, verbal cues, manual manipulaiton) see patient instruction for more detail             PT Education - 01/22/18 0951    Education provided  Yes    Education Details  HEP review, bed moblity, pt sleep set up, sleep hygiene, reduce time lying in bed prior to falling asleep (reading time). Ice (frozen bottle) rolling IT band    Person(s) Educated  Patient    Methods  Explanation;Demonstration;Verbal cues    Comprehension  Verbalized understanding;Need further instruction        01/22/18 1227  PT SHORT TERM GOAL #1  Title Pt will demonstrate & verbalize methods to reduce her pain with positions & activities. (ALL STGs TARGET 01/22/2018)  Baseline (MET3/26) pt reports her HEP exercises hep to keep pain at bay during the day and in the morning.    Time 3  Period Weeks  Status Achieved  PT SHORT TERM GOAL #2  Title Pt will demonstrate independence with initial HEP to reduce pain and increase LE strength  Baseline MET(3/26) pateint demonstrated and verbilized HEP and how and when she is doing them to reduce her pain.   Time 3  Period Weeks  Status Achieved  PT SHORT TERM GOAL #3  Title Pt will report a reduction in Right hip/leg pain </= 8/10 with supine positioning for 5 minutes in order to improve her sleep   Baseline Partially met (3/26) pt report pain with transfer but is able to maintain posiiton for a few  hours before significant pain onset  Time 3  Period Weeks  Status Partially Met    PT Short Term Goals - 01/22/18 1540      PT SHORT TERM GOAL #1   Title  Patient will increase the number of hours slept without being woke by pain by 1 hour.  In order to improve QOL.    Baseline  currently pt is able to rest from 11pm to 2am then after that 3 hours she reports being up for various times based on pain.     Time  30    Period  Days    Status  New    Target Date  02/21/18      PT SHORT TERM GOAL #2   Title  Pt will be Independent with updated HEP in order to continue to reduce pain thought the day and night.    Baseline  pt is independent with inital HEP but needs updated exercises to further address medical issues.     Time  30    Period  Days    Status  New    Target Date  02/21/18      PT SHORT TERM GOAL #3   Title  Pt will verbalize methods used to reduce pain during acute night pain flare ups to enable her to return to sleep sooner.     Baseline  pt currently unable to reduce pain at night. She awakens after 3 hours sleep and is awake for 2-3 hours.     Time  30    Period  Days    Status  New    Target Date  02/21/18        PT Long Term Goals - 01/22/18 1347      PT LONG TERM GOAL #1   Title  pt will be independent with HEP/ ongoing fitness plan ( ALL TARGET 03/08/2018)    Baseline  No long term HEP established and patient has medical condition requiring skilled PT instruction    Time  11    Period  Weeks    Status  On-going    Target Date  03/08/18      PT LONG TERM GOAL #2   Title  Patient reports 50% improvement in night pain to enable improved sleep.     Baseline  Patient reports night pain 10/10 waking her after 3-4 hours of sleep and stays awake for 1-3 hours.     Time  11    Period  Weeks    Status  On-going    Target Date  03/08/18      PT LONG TERM GOAL #3   Title  Pt ambulates 1000' outdoors on grass, ramps, curbs, and paved ground without device  modified independent to enable community mobility.     Baseline  Patient ambulates 120' at 3.07 ft/sec with abnormal gait pattern & reports pain up to 8-10/10 when ambulating in community.     Time  11    Period  Weeks    Status  On-going    Target Date  03/08/18      PT LONG TERM GOAL #4   Title  Patient reports pain improvement by 50% with standing and positions during awake hours to enable ability to perform ADLs with less pain limiting her function.     Baseline  Patient reports pain 8-10/10 with standing ADLS limiting to 3-5 minutes.     Time  11    Period  Weeks    Status  On-going    Target Date  03/08/18      PT LONG TERM GOAL #5   Title  Patient verbalizes methods to reduce pain to improve her quality of life.     Baseline  Patient is unknowledgeable in pain reduction techniques and has high levels of  pain limiting daily functions.     Time  11    Period  Weeks    Status  On-going    Target Date  03/08/18      PT LONG TERM GOAL #6   Title  Patient verbalizes increased 50% confindence in balance without fear of falling to improve activities.     Baseline  Patient reports high fear of falling with activities & reluctance to leave her home due to fear of falling.     Time  11    Period  Weeks    Status  On-going    Target Date  03/08/18      PT LONG TERM GOAL #7   Title  Pt will improve FGA score to 27/30 to decr. falls risk.     Baseline  19/30    Status  On-going    Target Date  03/08/18            Plan - 01/22/18 1232    Clinical Impression Statement  Today's PT session focused on reassessment of patient's pain presentation based on her reports of minimal pain reduction following her PT HEP. Patient has demonstrated minimal pain reduction during the night hours when pain wakes her up (around 2am). During this time patient's HEP has had minimal effect at pain reduction. However, based on patient subjective information, the HEP exercises has improved AM mobility  and have reduced pain symptoms during ADLs. Patient was observed to have poor body mechanics when transferring from supine to sit and sit to supine. PT was taught proper technique which significantly reduced pain levels during transfers. Pt as also been instructed to spend less time sidelying in bed (a position that results in mild pain and numbness prior to sleeping) while she is reading at night and to sit in her recliner (a position she reports a mild reduction of pain in sitting position). Then to return to her bed when she is ready to fall asleep in order to adjust time in bed in attempt to sleep longer without being awaken from pain.  In addition to this patient and PT reviewed and modified HEP in order for her to continue to benefit from exercise. During this session pt also Met 2/3 of her short term goals with the 3rd being partially met. Reporting methods to reduce pain flare-ups (during the day), and being independent with her current HEP. Patient will continue form skilled PT intervention to continue to further address the issues noted above in addition to further pain reduction and activity modification.    Rehab Potential  Good    Clinical Impairments Affecting Rehab Potential  Positive additude, pain levels    PT Frequency  Other (comment) 1x/wk for 3 weeks, then 2x/wk for 6 weeks    PT Duration  Other (comment) 1x/wk for 3 weeks, then 2x/wk for 6 week    PT Treatment/Interventions  Therapeutic activities;Therapeutic exercise;Balance training;Neuromuscular re-education;Patient/family education;Functional mobility training;ADLs/Self Care Home Management;Passive range of motion;Energy conservation;Gait training;DME Instruction;Stair training;Manual techniques;Moist Heat;Ultrasound    PT Next Visit Plan  reassess pain levels, HEP, and bed mobility/transfers for reduction in pain, work towards updated STGs.    Consulted and Agree with Plan of Care  Patient       Patient will benefit from skilled  therapeutic intervention in order to improve the following deficits and impairments:  Abnormal gait, Decreased range of motion, Difficulty walking, Dizziness, Decreased activity tolerance, Decreased balance, Decreased strength, Decreased mobility, Pain, Decreased endurance, Impaired flexibility, Postural  dysfunction, Obesity  Visit Diagnosis: Other abnormalities of gait and mobility  Pain in right hip  Unsteadiness on feet  Pain in right lower leg  Muscle weakness (generalized)     Problem List Patient Active Problem List   Diagnosis Date Noted  . Ganglion cyst of right foot 07/04/2017  . Chronic tension-type headache, not intractable 12/30/2015  . Post-operative state 12/15/2015  . Simple partial seizure disorder ([place]) 11/04/2015  . Left sided numbness 09/25/2015  . Acute kidney injury ([place]) 09/25/2015  . Leukopenia 09/25/2015  . Tension vascular headache 09/07/2015  . Meralgia paresthetica of left side 09/07/2015  . Fainting spell 09/07/2015  . Prediabetes 09/02/2015  . Stye 08/06/2014  . Essential hypertension, benign 04/16/2014  . CVA (cerebral vascular accident) ([place]) 04/16/2014  . Headache 04/16/2014  . Tension headache 03/06/2014  . Obesity, unspecified 08/13/2013  . 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 ICH (intracerebral hemorrhage) ([place]) 07/21/2013  . Hypokalemia 07/21/2013  . Intracerebral hemorrhage ([place]) 07/06/2013  . Essential hypertension 02/24/2009   Waunita Schooner SPT 01/22/2018, 12:38 PM  Jamey Reas PT, DPT 01/22/2018, 3:59 PM  [facility] [address] [place], [place], [address] Phone: [phone]   Fax:  [phone]  Name: NYELLA ECKELS MRN: [id number] Date of Birth: [birth year]

## 2018-01-23 ENCOUNTER — Encounter: Payer: Self-pay | Admitting: Internal Medicine

## 2018-01-23 ENCOUNTER — Ambulatory Visit: Payer: Medicaid Other | Attending: Internal Medicine | Admitting: Internal Medicine

## 2018-01-23 VITALS — BP 120/80 | HR 75 | Temp 98.5°F | Resp 18 | Ht 67.0 in | Wt 251.0 lb

## 2018-01-23 DIAGNOSIS — Z79899 Other long term (current) drug therapy: Secondary | ICD-10-CM | POA: Diagnosis not present

## 2018-01-23 DIAGNOSIS — M25551 Pain in right hip: Secondary | ICD-10-CM

## 2018-01-23 DIAGNOSIS — Z7982 Long term (current) use of aspirin: Secondary | ICD-10-CM | POA: Diagnosis not present

## 2018-01-23 DIAGNOSIS — Z8673 Personal history of transient ischemic attack (TIA), and cerebral infarction without residual deficits: Secondary | ICD-10-CM | POA: Insufficient documentation

## 2018-01-23 DIAGNOSIS — M25552 Pain in left hip: Secondary | ICD-10-CM | POA: Insufficient documentation

## 2018-01-23 DIAGNOSIS — I10 Essential (primary) hypertension: Secondary | ICD-10-CM | POA: Diagnosis present

## 2018-01-23 DIAGNOSIS — Z888 Allergy status to other drugs, medicaments and biological substances status: Secondary | ICD-10-CM | POA: Insufficient documentation

## 2018-01-23 MED ORDER — ACETAMINOPHEN-CODEINE #3 300-30 MG PO TABS: 1.0000 | ORAL_TABLET | ORAL | 0 refills | Status: DC | PRN

## 2018-01-23 MED FILL — ACETAMINOPHEN/COD #3 TABLET: 300-30 | 7 days supply | Qty: 42 | Fill #0

## 2018-01-23 NOTE — Patient Instructions (Signed)
Hypertension Hypertension is another name for high blood pressure. High blood pressure forces your heart to work harder to pump blood. This can cause problems over time. There are two numbers in a blood pressure reading. There is a top number (systolic) over a bottom number (diastolic). It is best to have a blood pressure below 120/80. Healthy choices can help lower your blood pressure. You may need medicine to help lower your blood pressure if:  Your blood pressure cannot be lowered with healthy choices.  Your blood pressure is higher than 130/80.  Follow these instructions at home: Eating and drinking  If directed, follow the DASH eating plan. This diet includes: ? Filling half of your plate at each meal with fruits and vegetables. ? Filling one quarter of your plate at each meal with whole grains. Whole grains include whole wheat pasta, brown rice, and whole grain bread. ? Eating or drinking low-fat dairy products, such as skim milk or low-fat yogurt. ? Filling one quarter of your plate at each meal with low-fat (lean) proteins. Low-fat proteins include fish, skinless chicken, eggs, beans, and tofu. ? Avoiding fatty meat, cured and processed meat, or chicken with skin. ? Avoiding premade or processed food.  Eat less than 1,500 mg of salt (sodium) a day.  Limit alcohol use to no more than 1 drink a day for nonpregnant women and 2 drinks a day for men. One drink equals 12 oz of beer, 5 oz of wine, or 1 oz of hard liquor. Lifestyle  Work with your doctor to stay at a healthy weight or to lose weight. Ask your doctor what the best weight is for you.  Get at least 30 minutes of exercise that causes your heart to beat faster (aerobic exercise) most days of the week. This may include walking, swimming, or biking.  Get at least 30 minutes of exercise that strengthens your muscles (resistance exercise) at least 3 days a week. This may include lifting weights or pilates.  Do not use any  products that contain nicotine or tobacco. This includes cigarettes and e-cigarettes. If you need help quitting, ask your doctor.  Check your blood pressure at home as told by your doctor.  Keep all follow-up visits as told by your doctor. This is important. Medicines  Take over-the-counter and prescription medicines only as told by your doctor. Follow directions carefully.  Do not skip doses of blood pressure medicine. The medicine does not work as well if you skip doses. Skipping doses also puts you at risk for problems.  Ask your doctor about side effects or reactions to medicines that you should watch for. Contact a doctor if:  You think you are having a reaction to the medicine you are taking.  You have headaches that keep coming back (recurring).  You feel dizzy.  You have swelling in your ankles.  You have trouble with your vision. Get help right away if:  You get a very bad headache.  You start to feel confused.  You feel weak or numb.  You feel faint.  You get very bad pain in your: ? Chest. ? Belly (abdomen).  You throw up (vomit) more than once.  You have trouble breathing. Summary  Hypertension is another name for high blood pressure.  Making healthy choices can help lower blood pressure. If your blood pressure cannot be controlled with healthy choices, you may need to take medicine. This information is not intended to replace advice given to you by your health care   provider. Make sure you discuss any questions you have with your health care provider. Document Released: 04/03/2008 Document Revised: 09/13/2016 Document Reviewed: 09/13/2016 Elsevier Interactive Patient Education  2018 [place]. Hip Pain The hip is the joint between the upper legs and the lower pelvis. The bones, cartilage, tendons, and muscles of your hip joint support your body and allow you to move around. Hip pain can range from a minor ache to severe pain in one or both of your  hips. The pain may be felt on the inside of the hip joint near the groin, or the outside near the buttocks and upper thigh. You may also have swelling or stiffness. Follow these instructions at home: Managing pain, stiffness, and swelling  If directed, apply ice to the injured area. ? Put ice in a plastic bag. ? Place a towel between your skin and the bag. ? Leave the ice on for 20 minutes, 2-3 times a day  Sleep with a pillow between your legs on your most comfortable side.  Avoid any activities that cause pain. General instructions  Take over-the-counter and prescription medicines only as told by your health care provider.  Do any exercises as told by your health care provider.  Record the following: ? How often you have hip pain. ? The location of your pain. ? What the pain feels like. ? What makes the pain worse.  Keep all follow-up visits as told by your health care provider. This is important. Contact a health care provider if:  You cannot put weight on your leg.  Your pain or swelling continues or gets worse after one week.  It gets harder to walk.  You have a fever. Get help right away if:  You fall.  You have a sudden increase in pain and swelling in your hip.  Your hip is red or swollen or very tender to touch. Summary  Hip pain can range from a minor ache to severe pain in one or both of your hips.  The pain may be felt on the inside of the hip joint near the groin, or the outside near the buttocks and upper thigh.  Avoid any activities that cause pain.  Record how often you have hip pain, the location of the pain, what makes it worse and what it feels like. This information is not intended to replace advice given to you by your health care provider. Make sure you discuss any questions you have with your health care provider. Document Released: 04/05/2010 Document Revised: 09/18/2016 Document Reviewed: 09/18/2016 Elsevier Interactive Patient Education   [employer].

## 2018-01-23 NOTE — Progress Notes (Signed)
Alison Ward, is a 57 y.o. female  GUR:[id number]  EGB:[id number]  DOB - 04/05/1961  Chief Complaint  Patient presents with  . Hypertension      Subjective:   Alison Ward is a 57 y.o. 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 Patient has been attending Rehab sessions for gait training and strength training, pain in her hips are slowly getting better but she was advised to get an MRI of her left hip joint done to rule out AVN or other forms of osteoarthritis. She is here today to have the MRI ordered. She has no new complaint, she claims her headaches is better, no more daily headaches, she is now able to drive. BP is controlled. 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 She denies any suicidal ideation or thought. Patient has No headache, No chest pain, No abdominal pain - No Nausea, No new weakness tingling or numbness, No Cough - SOB.  ALLERGIES: Allergies  Allergen Reactions  . Elavil [Amitriptyline] Other (See Comments)    DIZZINESS  . Topiramate Er Other (See Comments)    Suicidal Thoughts- [place]   PAST MEDICAL HISTORY: Past Medical History:  Diagnosis Date  . Chronic headaches    daily, bitemporal, associated with tingling in face  . Heart murmur   . Hypertension   . Seizures ([place])    partial seizures last one on 10/22/15  On Keppra currently   . Stroke [facility]) 07/05/2013   MEDICATIONS AT HOME: Prior to Admission medications   Medication Sig Start Date End Date Taking? Authorizing Provider  acetaminophen (TYLENOL) 500 MG tablet Take 500 mg by mouth every 6 (six) hours as needed.   Yes [provider]  aspirin EC 81 MG tablet Take 1 tablet (81 mg total) by mouth daily. 01/15/17  Yes Tresa Garter, MD  atorvastatin (LIPITOR) 40 MG tablet Take 1 tablet (40 mg total) by mouth daily. 11/21/17   Yes Tresa Garter, MD  fluticasone (FLONASE) 50 MCG/ACT nasal spray Place 2 sprays into both nostrils daily. 07/04/17  Yes Tresa Garter, MD  gabapentin (NEURONTIN) 300 MG capsule Take 2 capsules (600 mg total) by mouth 3 (three) times daily. 12/18/17  Yes Tresa Garter, MD  levETIRAcetam (KEPPRA XR) 500 MG 24 hr tablet Take 1 tablet (500 mg total) by mouth daily. 11/21/17  Yes Tresa Garter, MD  lisinopril-hydrochlorothiazide (PRINZIDE,ZESTORETIC) 20-25 MG tablet Take 1 tablet by mouth daily. 11/21/17  Yes Tresa Garter, MD  acetaminophen-codeine (TYLENOL #3) 300-30 MG tablet Take 1 tablet by mouth every 4 (four) hours as needed. 01/23/18   Tresa Garter, MD   Objective:   Vitals:   01/23/18 1043  BP: 120/80  Pulse: 75  Resp: 18  Temp: 98.5 F (36.9 C)  TempSrc: Oral  SpO2: 95%  Weight: 251 lb (113.9 kg)  Height: 5\' 7"  (1.702 m)   Exam General appearance : Awake, alert, not in any distress. Speech Clear. Not toxic looking HEENT: Atraumatic and Normocephalic, pupils equally reactive to light and accomodation Neck: Supple, no JVD. No cervical lymphadenopathy.  Chest: Good air entry bilaterally, no added sounds  CVS: S1 S2 regular, no murmurs.  Abdomen: Bowel sounds present, Non tender and not distended with no gaurding, rigidity or rebound. Extremities: B/L Lower Ext shows no edema, both legs are warm to touch Neurology: Awake alert, and oriented  X 3, CN II-XII intact, Non focal Skin: No Rash  Data Review Lab Results  Component Value Date   HGBA1C 5.7 02/07/2017   HGBA1C 5.7 12/30/2015   HGBA1C 5.80 07/08/2015    Assessment & Plan   1. Pain of right hip joint  - MR HIP RIGHT W WO CONTRAST; Future - Continue pain management  2. Essential hypertension, benign  We have discussed target BP range and blood pressure goal. I have advised patient to check BP regularly and to call us back or report to clinic if the numbers are consistently  higher than 140/90. We discussed the importance of compliance with medical therapy and DASH diet recommended, consequences of uncontrolled hypertension discussed.  - continue current BP medications  Patient have been counseled extensively about nutrition and exercise. Other issues discussed during this visit include: low cholesterol diet, weight control and daily exercise, importance of adherence with medications and regular follow-up. We also discussed long term complications of uncontrolled hypertension.   Return in about 3 months (around 04/25/2018) for Follow up HTN, Follow up Pain and comorbidities.  The patient was given clear instructions to go to ER or return to medical center if symptoms don't improve, worsen or new problems develop. The patient verbalized understanding. The patient was told to call to get lab results if they haven't heard anything in the next week.   This note has been created with Surveyor, quantity. Any transcriptional errors are unintentional.    Angelica Chessman, MD, MHA, Karilyn Cota, [place] and [facility], [place]   01/23/2018, 11:02 AM

## 2018-01-30 ENCOUNTER — Encounter: Payer: Self-pay | Admitting: Physical Therapy

## 2018-01-30 ENCOUNTER — Ambulatory Visit: Payer: Medicaid Other | Attending: Internal Medicine | Admitting: Physical Therapy

## 2018-01-30 DIAGNOSIS — M6281 Muscle weakness (generalized): Secondary | ICD-10-CM | POA: Diagnosis present

## 2018-01-30 DIAGNOSIS — M25551 Pain in right hip: Secondary | ICD-10-CM | POA: Diagnosis present

## 2018-01-30 DIAGNOSIS — R2689 Other abnormalities of gait and mobility: Secondary | ICD-10-CM | POA: Insufficient documentation

## 2018-01-30 DIAGNOSIS — M25651 Stiffness of right hip, not elsewhere classified: Secondary | ICD-10-CM | POA: Diagnosis present

## 2018-01-30 DIAGNOSIS — R2681 Unsteadiness on feet: Secondary | ICD-10-CM | POA: Diagnosis present

## 2018-01-30 DIAGNOSIS — M79661 Pain in right lower leg: Secondary | ICD-10-CM | POA: Insufficient documentation

## 2018-01-30 NOTE — Therapy (Signed)
[facility] [address] [place], [place], [address] Phone: [phone]   Fax:  [phone]  Physical Therapy Treatment  Patient Details  Name: Alison Ward MRN: [id number] Date of Birth: 05-19-1961 Referring Provider: Angelica Chessman, MD    Encounter Date: 01/30/2018  PT End of Session - 01/30/18 1038    Visit Number  5    Number of Visits  16    Date for PT Re-Evaluation  03/08/18    Authorization Type  Medicaid     PT Start Time  0930    PT Stop Time  1010    PT Time Calculation (min)  40 min    Equipment Utilized During Treatment  Gait belt    Activity Tolerance  Patient tolerated treatment well    Behavior During Therapy  [facility] for tasks assessed/performed       Past Medical History:  Diagnosis Date  . Chronic headaches    daily, bitemporal, associated with tingling in face  . Heart murmur   . Hypertension   . Seizures ([place])    partial seizures last one on 10/22/15  On Keppra currently   . Stroke [facility]) 07/05/2013    Past Surgical History:  Procedure Laterality Date  . ABDOMINAL HYSTERECTOMY N/A 12/15/2015   Procedure: HYSTERECTOMY ABDOMINAL;  Surgeon: Emily Filbert, MD;  Location: [facility];  Service: Gynecology;  Laterality: N/A;  . CESAREAN SECTION    . ceserean section    . SALPINGOOPHORECTOMY Bilateral 12/15/2015   Procedure: SALPINGO OOPHORECTOMY;  Surgeon: Emily Filbert, MD;  Location: [facility];  Service: Gynecology;  Laterality: Bilateral;  . VENTRICULOSTOMY Left 07/07/2013   Procedure: VENTRICULOSTOMY;  Surgeon: Ophelia Charter, MD;  Location: [facility];  Service: Neurosurgery;  Laterality: Left;  Left Ventriculostomy and Removal of Right Vetriculostomy    There were no vitals filed for this visit.  Subjective Assessment - 01/30/18 0935    Subjective  Pt reports she feels that she is making progress with her pain reduction. She has been using the pillows to adjust her sleeping position. Pt  reports she has increased her night resting time by an additional hour. at night when she gets up she reports only being up for 1 hour rather than 3 hours due to mild pain at night.    Pertinent History  L sided weakness, and RLE pain. 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 Falls in 2016.hysterectomy 2017    Limitations  Walking;Standing;Sitting;House hold activities    Patient Stated Goals  reduce pain and be more mobile witout pain.     Currently in Pain?  Yes pain  up to 7/10 and down to 0/10 during treatment session                        [facility] Adult PT Treatment/Exercise - 01/30/18 0931      Transfers   Transfers  Sit to Stand;Stand to Sit    Sit to Stand  6: Modified independent (Device/Increase time)    Stand to Sit  6: Modified independent (Device/Increase time)      High Level Balance   High Level Balance Activities  Head turns;Sudden stops;Tandem walking;Backward walking in // bars with minG.  PT demo, verbal cues      Exercises   Other Exercises   See new HEP notes in patient instruction      Manual Therapy  Manual Therapy  Soft tissue mobilization;Myofascial release    Soft tissue mobilization  ITB (proximal muscular attachment and distal boney attachment  cross friction message     Myofascial Release  Glute Med. Min. Periformis, and Tensor fasciae latae. 5 min  pain 7/10 down to 0/10 with in 30 secound mod. pressure              PT Education - 01/30/18 1037    Education provided  Yes    Education Details  HEP exercises, pain reduction and management, review of adjustments made to home set up and sleeping positions    Person(s) Educated  Patient    Methods  Demonstration;Explanation    Comprehension  Verbalized understanding;Need further instruction       PT Short Term Goals - 01/22/18 1540      PT SHORT TERM GOAL #1   Title  Patient will  increase the number of hours slept without being woke by pain by 1 hour.  In order to improve QOL.    Baseline  currently pt is able to rest from 11pm to 2am then after that 3 hours she reports being up for various times based on pain.     Time  30    Period  Days    Status  New    Target Date  02/21/18      PT SHORT TERM GOAL #2   Title  Pt will be Independent with updated HEP in order to continue to reduce pain thought the day and night.    Baseline  pt is independent with inital HEP but needs updated exercises to further address medical issues.     Time  30    Period  Days    Status  New    Target Date  02/21/18      PT SHORT TERM GOAL #3   Title  Pt will verbalize methods used to reduce pain during acute night pain flare ups to enable her to return to sleep sooner.     Baseline  pt currently unable to reduce pain at night. She awakens after 3 hours sleep and is awake for 2-3 hours.     Time  30    Period  Days    Status  New    Target Date  02/21/18        PT Long Term Goals - 01/22/18 1347      PT LONG TERM GOAL #1   Title  pt will be independent with HEP/ ongoing fitness plan ( ALL TARGET 03/08/2018)    Baseline  No long term HEP established and patient has medical condition requiring skilled PT instruction    Time  11    Period  Weeks    Status  On-going    Target Date  03/08/18      PT LONG TERM GOAL #2   Title  Patient reports 50% improvement in night pain to enable improved sleep.     Baseline  Patient reports night pain 10/10 waking her after 3-4 hours of sleep and stays awake for 1-3 hours.     Time  11    Period  Weeks    Status  On-going    Target Date  03/08/18      PT LONG TERM GOAL #3   Title  Pt ambulates 1000' outdoors on grass, ramps, curbs, and paved ground without device modified independent to enable community mobility.     Baseline  Patient ambulates 120'  at 3.07 ft/sec with abnormal gait pattern & reports pain up to 8-10/10 when ambulating in  community.     Time  11    Period  Weeks    Status  On-going    Target Date  03/08/18      PT LONG TERM GOAL #4   Title  Patient reports pain improvement by 50% with standing and positions during awake hours to enable ability to perform ADLs with less pain limiting her function.     Baseline  Patient reports pain 8-10/10 with standing ADLS limiting to 3-5 minutes.     Time  11    Period  Weeks    Status  On-going    Target Date  03/08/18      PT LONG TERM GOAL #5   Title  Patient verbalizes methods to reduce pain to improve her quality of life.     Baseline  Patient is unknowledgeable in pain reduction techniques and has high levels of pain limiting daily functions.     Time  11    Period  Weeks    Status  On-going    Target Date  03/08/18      PT LONG TERM GOAL #6   Title  Patient verbalizes increased 50% confindence in balance without fear of falling to improve activities.     Baseline  Patient reports high fear of falling with activities & reluctance to leave her home due to fear of falling.     Time  11    Period  Weeks    Status  On-going    Target Date  03/08/18      PT LONG TERM GOAL #7   Title  Pt will improve FGA score to 27/30 to decr. falls risk.     Baseline  19/30    Status  On-going    Target Date  03/08/18            Plan - 01/30/18 1045    Clinical Impression Statement  During today's PT session patient demonstrated significant reduction in pain symptoms both during PT session and subjectively  reported reduction in her home environment. Patient seems to be very happy with her ability to sleep longer at night. Now that pain management has made significant progress today's intervention was focused on progression of HEP exercise  and more aggressive manual therapies to reduce tightness and tone at patients Iliotibial band insertion as well as muscle spasm and tightness in gluteal and proximal hip musculature patient responded well to trigger point release  therapy with pain reduction. Patient also demonstrated a tolerance for hip abduction/adduction exercises.     Rehab Potential  Good    Clinical Impairments Affecting Rehab Potential  Positive additude, pain levels    PT Frequency  Other (comment)    PT Duration  Other (comment)    PT Treatment/Interventions  Therapeutic activities;Therapeutic exercise;Balance training;Neuromuscular re-education;Patient/family education;Functional mobility training;ADLs/Self Care Home Management;Passive range of motion;Energy conservation;Gait training;DME Instruction;Stair training;Manual techniques;Moist Heat;Ultrasound    PT Next Visit Plan  continue to assess pain levels and response to HEP( expected to increase mildly with increase in activity), start to address ambulation goals and balance training with pain in a managed state.     Consulted and Agree with Plan of Care  Patient       Patient will benefit from skilled therapeutic intervention in order to improve the following deficits and impairments:  Abnormal gait, Decreased range of motion, Difficulty walking, Dizziness, Decreased activity  tolerance, Decreased balance, Decreased strength, Decreased mobility, Pain, Decreased endurance, Impaired flexibility, Postural dysfunction, Obesity  Visit Diagnosis: Other abnormalities of gait and mobility  Pain in right hip  Unsteadiness on feet  Pain in right lower leg  Muscle weakness (generalized)  Stiffness of right hip, not elsewhere classified     Problem List Patient Active Problem List   Diagnosis Date Noted  . Ganglion cyst of right foot 07/04/2017  . Chronic tension-type headache, not intractable 12/30/2015  . Post-operative state 12/15/2015  . Simple partial seizure disorder ([place]) 11/04/2015  . Left sided numbness 09/25/2015  . Acute kidney injury ([place]) 09/25/2015  . Leukopenia 09/25/2015  . Tension vascular headache 09/07/2015  . Meralgia paresthetica of left side 09/07/2015  .  Fainting spell 09/07/2015  . Prediabetes 09/02/2015  . Stye 08/06/2014  . Essential hypertension, benign 04/16/2014  . CVA (cerebral vascular accident) ([place]) 04/16/2014  . Headache 04/16/2014  . Tension headache 03/06/2014  . Obesity, unspecified 08/13/2013  . 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[place]) 07/22/2013  . ICH (intracerebral hemorrhage) ([place]) 07/21/2013  . Hypokalemia 07/21/2013  . Intracerebral hemorrhage ([place]) 07/06/2013  . Essential hypertension 02/24/2009    Waunita Schooner SPT 01/30/2018, 10:47 AM  [facility] [address] [place], [place], [address] Phone: [phone]   Fax:  [phone]  Name: Alison Ward MRN: [id number] Date of Birth: September 28, 1961

## 2018-01-30 NOTE — Patient Instructions (Signed)
Hip: Adduction / Abduction    Get ON TARGET. Between knees, place ball large enough to put hips in neutral. Do not twist hips out of position. 1.Squeeze top knee toward floor. 2. Lift top knee. Hold each position _3__ seconds. Repeat sequence _5__ times. Lie on left side. Do _2__ sessions per day 2-3 times a week.  Copyright  VHI. All rights reserved.  Hip Abduction: Side-Lying (Single Leg)  !!!!!!!!!!!!!PILLOW BETWEEN KNEES!!!!!!!!!!!!!!  Lie on side with knees bent, tubing around thighs just above knees. Raise top leg, keeping knee bent. Repeat _5_ times per set. Repeat on other side. Do _2_ sets per session. Do _2-3_ sessions per week.  http://tub.exer.us/44   Copyright  VHI. All rights reserved.

## 2018-02-01 ENCOUNTER — Ambulatory Visit: Payer: Medicaid Other

## 2018-02-02 ENCOUNTER — Other Ambulatory Visit: Payer: Self-pay | Admitting: Internal Medicine

## 2018-02-02 DIAGNOSIS — M461 Sacroiliitis, not elsewhere classified: Secondary | ICD-10-CM

## 2018-02-04 ENCOUNTER — Telehealth: Payer: Self-pay | Admitting: *Deleted

## 2018-02-04 MED FILL — LEVETIRACETAM ER 500 MG TAB: 500 | 30 days supply | Qty: 30 | Fill #2

## 2018-02-04 MED FILL — GABAPENTIN 300 MG CAPSULE: 300 | 30 days supply | Qty: 180 | Fill #1

## 2018-02-04 MED FILL — LISINOPRIL-HCTZ 20-25 MG TA: 20-25 | 30 days supply | Qty: 30 | Fill #2

## 2018-02-04 NOTE — Telephone Encounter (Signed)
Medical Assistant left message on patient's home and cell voicemail. Voicemail states to give a call back to [place] with [facility] at [phone]. Patient is aware of no acute abnormalities being present but inflammation being noted. Patient was referred to [facility] [place] and will receive a call within three weeks to schedule the appt.

## 2018-02-04 NOTE — Telephone Encounter (Signed)
-----   Message from Tresa Garter, MD sent at 02/02/2018  3:07 PM EDT ----- Hip X Ray showed no acute hip joint abnormalities. There is a questionable inflammation. Will refer to Orthopedist. Ordered

## 2018-02-05 ENCOUNTER — Ambulatory Visit: Payer: Medicaid Other | Admitting: Physical Therapy

## 2018-02-05 ENCOUNTER — Encounter: Payer: Self-pay | Admitting: Physical Therapy

## 2018-02-05 DIAGNOSIS — R2681 Unsteadiness on feet: Secondary | ICD-10-CM

## 2018-02-05 DIAGNOSIS — M25551 Pain in right hip: Secondary | ICD-10-CM

## 2018-02-05 DIAGNOSIS — M6281 Muscle weakness (generalized): Secondary | ICD-10-CM

## 2018-02-05 DIAGNOSIS — M25651 Stiffness of right hip, not elsewhere classified: Secondary | ICD-10-CM

## 2018-02-05 DIAGNOSIS — R2689 Other abnormalities of gait and mobility: Secondary | ICD-10-CM

## 2018-02-05 DIAGNOSIS — M79661 Pain in right lower leg: Secondary | ICD-10-CM

## 2018-02-05 NOTE — Therapy (Addendum)
[facility] [address] [place], [place], [address] Phone: [phone]   Fax:  [phone]  Physical Therapy Treatment  Patient Details  Name: Alison Ward MRN: [id number] Date of Birth: 1961-07-07 Referring Provider: Angelica Chessman, MD    Encounter Date: 02/05/2018  PT End of Session - 02/05/18 0942    Visit Number  6    Number of Visits  16    Date for PT Re-Evaluation  03/08/18    Authorization Type  Medicaid     PT Start Time  0936    PT Stop Time  1014    PT Time Calculation (min)  38 min    Equipment Utilized During Treatment  Gait belt    Activity Tolerance  Patient tolerated treatment well    Behavior During Therapy  [facility] for tasks assessed/performed       Past Medical History:  Diagnosis Date  . Chronic headaches    daily, bitemporal, associated with tingling in face  . Heart murmur   . Hypertension   . Seizures ([place])    partial seizures last one on 10/22/15  On Keppra currently   . Stroke [facility]) 07/05/2013    Past Surgical History:  Procedure Laterality Date  . ABDOMINAL HYSTERECTOMY N/A 12/15/2015   Procedure: HYSTERECTOMY ABDOMINAL;  Surgeon: Emily Filbert, MD;  Location: [facility];  Service: Gynecology;  Laterality: N/A;  . CESAREAN SECTION    . ceserean section    . SALPINGOOPHORECTOMY Bilateral 12/15/2015   Procedure: SALPINGO OOPHORECTOMY;  Surgeon: Emily Filbert, MD;  Location: [facility];  Service: Gynecology;  Laterality: Bilateral;  . VENTRICULOSTOMY Left 07/07/2013   Procedure: VENTRICULOSTOMY;  Surgeon: Ophelia Charter, MD;  Location: [facility];  Service: Neurosurgery;  Laterality: Left;  Left Ventriculostomy and Removal of Right Vetriculostomy    There were no vitals filed for this visit.  Subjective Assessment - 02/05/18 0939    Subjective  pt reports no falls, and mild increase in hip pain but she was ready for it with her HEP. pt reports she has been having difficulty with headaches  lasting 2-3 days. but pt reports she has been able to increase her sleeping time to 4-5 hours of sleep each night. Pt reports she would like to continue to increase her current level of activity with morning walks. When asked about rollator pt reports her mother had one that she can use.     Pertinent History  L sided weakness, and RLE pain. 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 Falls in 2016.hysterectomy 2017    Limitations  Walking;Standing;Sitting;House hold activities    Patient Stated Goals  reduce pain and be more mobile witout pain.     Currently in Pain?  No/denies                       [facility] Adult PT Treatment/Exercise - 02/05/18 1000      Transfers   Transfers  Sit to Stand;Stand to Sit    Sit to Stand  6: Modified independent (Device/Increase time)    Stand to Sit  6: Modified independent (Device/Increase time)      Ambulation/Gait   Ambulation/Gait  Yes    Ambulation/Gait Assistance  6: Modified independent (Device/Increase time)    Ambulation Distance (Feet)  1000 Feet standing rest break at 742ft     Assistive device  None    Gait Pattern  Decreased arm swing - right;Step-through pattern;Decreased stride length;Decreased weight shift to right;Trendelenburg;Lateral hip instability;Lateral trunk lean to right;Narrow base of support;Poor foot clearance - right;Decreased stance time - right;Antalgic    Ambulation Surface  Unlevel;Level;Paved;Outdoor;Indoor    Curb  5: Supervision;4: Min assist    Curb Details (indicate cue type and reason)  minA LOB self corrected when stepping off curb    Pre-Gait Activities  Incline standing with plantarflexion push off focus to facilitate triple extension during gait to reduce lumbar spine extension when ascending incline    Gait Comments  Significant  Bilateral Trendelenburg hip drop when fatigued.      Exercises   Exercises   Other Exercises    Other Exercises   PTdemo,verbal cues tactile cues, and // bar support for closed kinetic chair hip abduction stepping 3x5 each.              PT Education - 02/05/18 1140    Education provided  Yes    Education Details  Walking plan( distance and symptoms), progress on HEP and pain reduction, rollator use and set up for longer distance walking     Person(s) Educated  Patient    Methods  Explanation;Demonstration    Comprehension  Verbalized understanding       PT Short Term Goals - 01/22/18 1540      PT SHORT TERM GOAL #1   Title  Patient will increase the number of hours slept without being woke by pain by 1 hour.  In order to improve QOL.    Baseline  currently pt is able to rest from 11pm to 2am then after that 3 hours she reports being up for various times based on pain.     Time  30    Period  Days    Status  New    Target Date  02/21/18      PT SHORT TERM GOAL #2   Title  Pt will be Independent with updated HEP in order to continue to reduce pain thought the day and night.    Baseline  pt is independent with inital HEP but needs updated exercises to further address medical issues.     Time  30    Period  Days    Status  New    Target Date  02/21/18      PT SHORT TERM GOAL #3   Title  Pt will verbalize methods used to reduce pain during acute night pain flare ups to enable her to return to sleep sooner.     Baseline  pt currently unable to reduce pain at night. She awakens after 3 hours sleep and is awake for 2-3 hours.     Time  30    Period  Days    Status  New    Target Date  02/21/18        PT Long Term Goals - 01/22/18 1347      PT LONG TERM GOAL #1   Title  pt will be independent with HEP/ ongoing fitness plan ( ALL TARGET 03/08/2018)    Baseline  No long term HEP established and patient has medical condition requiring skilled PT instruction    Time  11    Period  Weeks    Status  On-going    Target Date  03/08/18      PT LONG  TERM GOAL #2   Title  Patient reports 50% improvement in night pain to enable improved sleep.  Baseline  Patient reports night pain 10/10 waking her after 3-4 hours of sleep and stays awake for 1-3 hours.     Time  11    Period  Weeks    Status  On-going    Target Date  03/08/18      PT LONG TERM GOAL #3   Title  Pt ambulates 1000' outdoors on grass, ramps, curbs, and paved ground without device modified independent to enable community mobility.     Baseline  Patient ambulates 120' at 3.07 ft/sec with abnormal gait pattern & reports pain up to 8-10/10 when ambulating in community.     Time  11    Period  Weeks    Status  On-going    Target Date  03/08/18      PT LONG TERM GOAL #4   Title  Patient reports pain improvement by 50% with standing and positions during awake hours to enable ability to perform ADLs with less pain limiting her function.     Baseline  Patient reports pain 8-10/10 with standing ADLS limiting to 3-5 minutes.     Time  11    Period  Weeks    Status  On-going    Target Date  03/08/18      PT LONG TERM GOAL #5   Title  Patient verbalizes methods to reduce pain to improve her quality of life.     Baseline  Patient is unknowledgeable in pain reduction techniques and has high levels of pain limiting daily functions.     Time  11    Period  Weeks    Status  On-going    Target Date  03/08/18      PT LONG TERM GOAL #6   Title  Patient verbalizes increased 50% confindence in balance without fear of falling to improve activities.     Baseline  Patient reports high fear of falling with activities & reluctance to leave her home due to fear of falling.     Time  11    Period  Weeks    Status  On-going    Target Date  03/08/18      PT LONG TERM GOAL #7   Title  Pt will improve FGA score to 27/30 to decr. falls risk.     Baseline  19/30    Status  On-going    Target Date  03/08/18            Plan - 02/05/18 1145    Clinical Impression Statement  During  today's PT session patient continues to tolerate increases in level of activity at home as well as during PT session. Pt has been able to reduce pain flare ups and continues to increase amount of time she is able to sleep without pain waking her up. Today Pt was able to ambulate 718ft before mild low back pain and buttock pain started. Pain was mildly reduced with standing rest break. PT observed significant increase in Trendelenburg gait when pt is fatigued further contributing to patient pain provocation.    Rehab Potential  Good    Clinical Impairments Affecting Rehab Potential  Positive additude, pain levels    PT Frequency  Other (comment)    PT Duration  Other (comment)    PT Treatment/Interventions  Therapeutic activities;Therapeutic exercise;Balance training;Neuromuscular re-education;Patient/family education;Functional mobility training;ADLs/Self Care Home Management;Passive range of motion;Energy conservation;Gait training;DME Instruction;Stair training;Manual techniques;Moist Heat;Ultrasound    PT Next Visit Plan  Continue to progress HEP when appropriate, review exercise  equipment pt can utilize safely at her local gym      Consulted and Agree with Plan of Care  Patient       Patient will benefit from skilled therapeutic intervention in order to improve the following deficits and impairments:  Abnormal gait, Decreased range of motion, Difficulty walking, Dizziness, Decreased activity tolerance, Decreased balance, Decreased strength, Decreased mobility, Pain, Decreased endurance, Impaired flexibility, Postural dysfunction, Obesity  Visit Diagnosis: Other abnormalities of gait and mobility  Pain in right hip  Unsteadiness on feet  Pain in right lower leg  Muscle weakness (generalized)  Stiffness of right hip, not elsewhere classified     Problem List Patient Active Problem List   Diagnosis Date Noted  . Ganglion cyst of right foot 07/04/2017  . Chronic tension-type  headache, not intractable 12/30/2015  . Post-operative state 12/15/2015  . Simple partial seizure disorder ([place]) 11/04/2015  . Left sided numbness 09/25/2015  . Acute kidney injury ([place]) 09/25/2015  . Leukopenia 09/25/2015  . Tension vascular headache 09/07/2015  . Meralgia paresthetica of left side 09/07/2015  . Fainting spell 09/07/2015  . Prediabetes 09/02/2015  . Stye 08/06/2014  . Essential hypertension, benign 04/16/2014  . CVA (cerebral vascular accident) ([place]) 04/16/2014  . Headache 04/16/2014  . Tension headache 03/06/2014  . Obesity, unspecified 08/13/2013  . 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[place]) 07/22/2013  . ICH (intracerebral hemorrhage) ([place]) 07/21/2013  . Hypokalemia 07/21/2013  . Intracerebral hemorrhage ([place]) 07/06/2013  . Essential hypertension 02/24/2009   Waunita Schooner SPT 02/05/2018, 11:47 PM  Jamey Reas , PT, DPT 02/05/2018, 12:55 PM  [facility] [address] [place], [place], [address] Phone: [phone]   Fax:  [phone]  Name: Alison Ward MRN: [id number] Date of Birth: [birth year]

## 2018-02-07 ENCOUNTER — Ambulatory Visit: Payer: Medicaid Other | Admitting: Physical Therapy

## 2018-02-07 ENCOUNTER — Ambulatory Visit: Payer: Medicaid Other

## 2018-02-07 VITALS — BP 140/96 | HR 65

## 2018-02-07 DIAGNOSIS — M25551 Pain in right hip: Secondary | ICD-10-CM

## 2018-02-07 DIAGNOSIS — R2681 Unsteadiness on feet: Secondary | ICD-10-CM

## 2018-02-07 DIAGNOSIS — M6281 Muscle weakness (generalized): Secondary | ICD-10-CM

## 2018-02-07 DIAGNOSIS — R2689 Other abnormalities of gait and mobility: Secondary | ICD-10-CM

## 2018-02-07 NOTE — Therapy (Signed)
[facility] [address] [place], [place], [address] Phone: [phone]   Fax:  [phone]  Physical Therapy Treatment  Patient Details  Name: Alison Ward MRN: [id number] Date of Birth: [birth year] Referring Provider: Angelica Chessman, MD    Encounter Date: 02/07/2018  PT End of Session - 02/07/18 1624    Visit Number  6 no charge    Number of Visits  16    Date for PT Re-Evaluation  03/08/18    Authorization Type  Medicaid     PT Start Time  8366    PT Stop Time  1550    PT Time Calculation (min)  20 min    Activity Tolerance  Patient limited by pain;Patient tolerated treatment well;Patient limited by fatigue    Behavior During Therapy  [facility] for tasks assessed/performed          Past Medical History:  Diagnosis Date  . Chronic headaches    daily, bitemporal, associated with tingling in face  . Heart murmur   . Hypertension   . Seizures ([place])    partial seizures last one on 10/22/15  On Keppra currently   . Stroke [facility]) 07/05/2013    Past Surgical History:  Procedure Laterality Date  . ABDOMINAL HYSTERECTOMY N/A 12/15/2015   Procedure: HYSTERECTOMY ABDOMINAL;  Surgeon: Emily Filbert, MD;  Location: [facility];  Service: Gynecology;  Laterality: N/A;  . CESAREAN SECTION    . ceserean section    . SALPINGOOPHORECTOMY Bilateral 12/15/2015   Procedure: SALPINGO OOPHORECTOMY;  Surgeon: Emily Filbert, MD;  Location: [facility];  Service: Gynecology;  Laterality: Bilateral;  . VENTRICULOSTOMY Left 07/07/2013   Procedure: VENTRICULOSTOMY;  Surgeon: Ophelia Charter, MD;  Location: [facility];  Service: Neurosurgery;  Laterality: Left;  Left Ventriculostomy and Removal of Right Vetriculostomy    Vitals:   02/07/18 1547  BP: (!) 140/96  Pulse: 65   Subjective Assessment - 02/07/18 1533    Subjective  pt reports she has been having a difficult day today and yesterday. pt reports strong headaches 7/10 all day yesterday,  pt requests to have morning sessions due to afternoons are very hard for her due fatigue later in the day and the headache she has been having ( pt had a 10:15 am). Pt reports she is to fatigued to do much during PT session but wanted to come in to tell PT that afternoons are just too difficult for her if she could stick to mornings she would feel much better. Pt her headache is currently a 5/10  and her BP is usually sits in the 120-130/70-80 range.     Pertinent History  L sided weakness, and RLE pain. 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 Falls in 2016.hysterectomy 2017    Limitations  Walking;Standing;Sitting;House hold activities    Patient Stated Goals  reduce pain and be more mobile witout pain.     Currently in Pain?  Yes    Pain Score  5     Pain Location  Head    Pain Descriptors / Indicators  Headache                                PT Education - 02/07/18 1549    Education provided  Yes    Education Details  High Blood pressure signs and symptoms. PT educated pt on  the importance of informing her MD is s/s and elevated BP persist, or to go to ED if s/s incr.    Person(s) Educated  Patient    Methods  Explanation;Demonstration    Comprehension  Verbalized understanding          PT Short Term Goals - 01/22/18 1540      PT SHORT TERM GOAL #1   Title  Patient will increase the number of hours slept without being woke by pain by 1 hour.  In order to improve QOL.    Baseline  currently pt is able to rest from 11pm to 2am then after that 3 hours she reports being up for various times based on pain.     Time  30    Period  Days    Status  New    Target Date  02/21/18      PT SHORT TERM GOAL #2   Title  Pt will be Independent with updated HEP in order to continue to reduce pain thought the day and night.    Baseline  pt is independent with inital HEP but  needs updated exercises to further address medical issues.     Time  30    Period  Days    Status  New    Target Date  02/21/18      PT SHORT TERM GOAL #3   Title  Pt will verbalize methods used to reduce pain during acute night pain flare ups to enable her to return to sleep sooner.     Baseline  pt currently unable to reduce pain at night. She awakens after 3 hours sleep and is awake for 2-3 hours.     Time  30    Period  Days    Status  New    Target Date  02/21/18        PT Long Term Goals - 01/22/18 1347      PT LONG TERM GOAL #1   Title  pt will be independent with HEP/ ongoing fitness plan ( ALL TARGET 03/08/2018)    Baseline  No long term HEP established and patient has medical condition requiring skilled PT instruction    Time  11    Period  Weeks    Status  On-going    Target Date  03/08/18      PT LONG TERM GOAL #2   Title  Patient reports 50% improvement in night pain to enable improved sleep.     Baseline  Patient reports night pain 10/10 waking her after 3-4 hours of sleep and stays awake for 1-3 hours.     Time  11    Period  Weeks    Status  On-going    Target Date  03/08/18      PT LONG TERM GOAL #3   Title  Pt ambulates 1000' outdoors on grass, ramps, curbs, and paved ground without device modified independent to enable community mobility.     Baseline  Patient ambulates 120' at 3.07 ft/sec with abnormal gait pattern & reports pain up to 8-10/10 when ambulating in community.     Time  11    Period  Weeks    Status  On-going    Target Date  03/08/18      PT LONG TERM GOAL #4   Title  Patient reports pain improvement by 50% with standing and positions during awake hours to enable ability to perform ADLs with less pain limiting her function.  Baseline  Patient reports pain 8-10/10 with standing ADLS limiting to 3-5 minutes.     Time  11    Period  Weeks    Status  On-going    Target Date  03/08/18      PT LONG TERM GOAL #5   Title  Patient  verbalizes methods to reduce pain to improve her quality of life.     Baseline  Patient is unknowledgeable in pain reduction techniques and has high levels of pain limiting daily functions.     Time  11    Period  Weeks    Status  On-going    Target Date  03/08/18      PT LONG TERM GOAL #6   Title  Patient verbalizes increased 50% confindence in balance without fear of falling to improve activities.     Baseline  Patient reports high fear of falling with activities & reluctance to leave her home due to fear of falling.     Time  11    Period  Weeks    Status  On-going    Target Date  03/08/18      PT LONG TERM GOAL #7   Title  Pt will improve FGA score to 27/30 to decr. falls risk.     Baseline  19/30    Status  On-going    Target Date  03/08/18            Plan - 02/07/18 1629    Clinical Impression Statement  PT arrived for what she thought was her 3:15pm appointment to inform pt that afternoon appointments are very difficult for her. She is usually very fatigued at this time of day and subjectively reported increases in headache s/s following moderate increase in overall activity. Pt BP was abnormally high for her during PT session. Due pt patients subjective reports of headache and fatigue to high for PT activities session was terminated after instruction on important of BP medications and to inform her neurologist ( during appointment tomorrow) of recent increases in her BP.     Clinical Impairments Affecting Rehab Potential  Positive additude, pain levels    PT Frequency  Other (comment)    PT Duration  Other (comment)    PT Treatment/Interventions  Therapeutic activities;Therapeutic exercise;Balance training;Neuromuscular re-education;Patient/family education;Functional mobility training;ADLs/Self Care Home Management;Passive range of motion;Energy conservation;Gait training;DME Instruction;Stair training;Manual techniques;Moist Heat;Ultrasound    PT Next Visit Plan  Continue  to progress HEP when appropriate, review exercise equipment pt can utilize safely at her local gym      Consulted and Agree with Plan of Care  Patient       Patient will benefit from skilled therapeutic intervention in order to improve the following deficits and impairments:  Abnormal gait, Decreased range of motion, Difficulty walking, Dizziness, Decreased activity tolerance, Decreased balance, Decreased strength, Decreased mobility, Pain, Decreased endurance, Impaired flexibility, Postural dysfunction, Obesity  Visit Diagnosis: Other abnormalities of gait and mobility  Pain in right hip  Unsteadiness on feet  Pain in right lower leg  Muscle weakness (generalized)  Stiffness of right hip, not elsewhere classified     Problem List Patient Active Problem List   Diagnosis Date Noted  . Ganglion cyst of right foot 07/04/2017  . Chronic tension-type headache, not intractable 12/30/2015  . Post-operative state 12/15/2015  . Simple partial seizure disorder ([place]) 11/04/2015  . Left sided numbness 09/25/2015  . Acute kidney injury ([place]) 09/25/2015  . Leukopenia 09/25/2015  . Tension vascular headache  09/07/2015  . Meralgia paresthetica of left side 09/07/2015  . Fainting spell 09/07/2015  . Prediabetes 09/02/2015  . Stye 08/06/2014  . Essential hypertension, benign 04/16/2014  . CVA (cerebral vascular accident) ([place]) 04/16/2014  . Headache 04/16/2014  . Tension headache 03/06/2014  . Obesity, unspecified 08/13/2013  . 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[place]) 07/22/2013  . ICH (intracerebral hemorrhage) ([place]) 07/21/2013  . Hypokalemia 07/21/2013  . Intracerebral hemorrhage ([place]) 07/06/2013  . Essential hypertension 02/24/2009    Waunita Schooner SPT 02/07/2018, 4:30 PM  [facility] [address] [place], [place], [address] Phone: [phone]   Fax:  [phone]  Name: Alison Ward MRN: [id number] Date  of Birth: 02/15/1961

## 2018-02-08 ENCOUNTER — Ambulatory Visit (INDEPENDENT_AMBULATORY_CARE_PROVIDER_SITE_OTHER): Payer: Medicaid Other | Admitting: Orthopaedic Surgery

## 2018-02-08 ENCOUNTER — Encounter (INDEPENDENT_AMBULATORY_CARE_PROVIDER_SITE_OTHER): Payer: Self-pay | Admitting: Orthopaedic Surgery

## 2018-02-08 ENCOUNTER — Ambulatory Visit (INDEPENDENT_AMBULATORY_CARE_PROVIDER_SITE_OTHER): Payer: Medicaid Other

## 2018-02-08 VITALS — BP 105/72 | HR 74

## 2018-02-08 DIAGNOSIS — M545 Low back pain: Secondary | ICD-10-CM | POA: Diagnosis not present

## 2018-02-08 MED ORDER — PREDNISONE 10 MG (21) PO TBPK: ORAL_TABLET | ORAL | 0 refills | Status: DC

## 2018-02-08 NOTE — Progress Notes (Signed)
Office Visit Note   Patient: Alison Ward           Date of Birth: 1961/07/24           MRN: [id number] Visit Date: 02/08/2018              Requested by: Tresa Garter, MD [place],  [id number] PCP: Tresa Garter, MD   Assessment & Plan: Visit Diagnoses:  1. Low back pain, unspecified back pain laterality, unspecified chronicity, with sciatica presence unspecified     Plan: Impression is lumbar radiculopathy.  We will have the patient continue with outpatient physical therapy where she appears to be making some progress.  We will also provide her with a steroid taper.  If she is not any better in the next several weeks, we may obtain an MRI of her lumbar spine.  She will call and let us know.  She will call if concerns or questions in the meantime.  Follow-Up Instructions: Return if symptoms worsen or fail to improve.   Orders:  Orders Placed This Encounter  Procedures  . XR Lumbar Spine 2-3 Views   Meds ordered this encounter  Medications  . predniSONE (STERAPRED UNI-PAK 21 TAB) 10 MG (21) TBPK tablet    Sig: Take as directed    Dispense:  21 tablet    Refill:  0      Procedures: No procedures performed   Clinical Data: No additional findings.   Subjective: Chief Complaint  Patient presents with  . Right Hip - Pain    HPI   patient is a pleasant 57 year old female who presents to our clinic today with right lower extremity pain.  This began approximately 2 years ago without any known injury.  She does note that around that time she sustained a stroke which affected her left side causing her to favor the right side.  The pain she has is to the right buttocks radiating down the entire aspect of the posterior leg and into the foot.  She does note previous pain to the groin but this resolved after the past several weeks.  Her pain primarily occurs when she is sitting in a chair and at night when she is trying to lay down.  The  only relief she gets is when she gets up and walks around.  No numbness tingling burning.  She has been taking Tylenol with codeine which does seem to help a little.  Of note, she saw her primary care provider where she was sent to physical therapy.  This is what seems to have helped her groin pain.  No previous hip pathology, but she does admit to an injury to her back in a few decades ago.  No lumbar surgery at that point.  Review of Systems as detailed in HPI.  All others reviewed and are negative.   Objective: Vital Signs: BP 105/72   Pulse 74   LMP 11/24/2011   Physical Exam well-developed well-nourished female in no acute distress.  Alert and oriented x3.  Ortho Exam examination of her right lower extremity reveals positive straight leg raise.  Negative logroll and negative Fader.  No tenderness over the greater trochanter.  Full motion of the lumbar spine.  No spinous or paraspinous tenderness.  No focal weakness.  She is neurovascularly intact distally.  Specialty Comments:  No specialty comments available.  Imaging: Previous imaging of the right hip show minimal degenerative changes   PMFS History:  Patient Active Problem List   Diagnosis Date Noted  . Ganglion cyst of right foot 07/04/2017  . Chronic tension-type headache, not intractable 12/30/2015  . Post-operative state 12/15/2015  . Simple partial seizure disorder ([place]) 11/04/2015  . Left sided numbness 09/25/2015  . Acute kidney injury ([place]) 09/25/2015  . Leukopenia 09/25/2015  . Tension vascular headache 09/07/2015  . Meralgia paresthetica of left side 09/07/2015  . Fainting spell 09/07/2015  . Prediabetes 09/02/2015  . Stye 08/06/2014  . Essential hypertension, benign 04/16/2014  . CVA (cerebral vascular accident) ([place]) 04/16/2014  . Headache 04/16/2014  . Tension headache 03/06/2014  . Obesity, unspecified 08/13/2013  . 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 ICH (intracerebral hemorrhage)  ([place]) 07/21/2013  . Hypokalemia 07/21/2013  . Intracerebral hemorrhage ([place]) 07/06/2013  . Essential hypertension 02/24/2009   Past Medical History:  Diagnosis Date  . Chronic headaches    daily, bitemporal, associated with tingling in face  . Heart murmur   . Hypertension   . Seizures ([place])    partial seizures last one on 10/22/15  On Keppra currently   . Stroke [facility]) 07/05/2013    Family History  Problem Relation Age of Onset  . Hypertension Mother   . Diabetes Mother   . Stroke Mother   . Hypertension Father   . Colon cancer Neg Hx   . Breast cancer Neg Hx     Past Surgical History:  Procedure Laterality Date  . ABDOMINAL HYSTERECTOMY N/A 12/15/2015   Procedure: HYSTERECTOMY ABDOMINAL;  Surgeon: Emily Filbert, MD;  Location: [facility];  Service: Gynecology;  Laterality: N/A;  . CESAREAN SECTION    . ceserean section    . SALPINGOOPHORECTOMY Bilateral 12/15/2015   Procedure: SALPINGO OOPHORECTOMY;  Surgeon: Emily Filbert, MD;  Location: [facility];  Service: Gynecology;  Laterality: Bilateral;  . VENTRICULOSTOMY Left 07/07/2013   Procedure: VENTRICULOSTOMY;  Surgeon: Ophelia Charter, MD;  Location: [facility];  Service: Neurosurgery;  Laterality: Left;  Left Ventriculostomy and Removal of Right Vetriculostomy   Social History   Occupational History  . Occupation: [employer]  Tobacco Use  . Smoking status: Never Smoker  . Smokeless tobacco: Never Used  Substance and Sexual Activity  . Alcohol use: No  . Drug use: No  . Sexual activity: Not on file

## 2018-02-12 ENCOUNTER — Encounter: Payer: Self-pay | Admitting: Physical Therapy

## 2018-02-12 ENCOUNTER — Ambulatory Visit: Payer: Medicaid Other | Admitting: Physical Therapy

## 2018-02-12 VITALS — BP 117/81 | HR 73

## 2018-02-12 DIAGNOSIS — R2689 Other abnormalities of gait and mobility: Secondary | ICD-10-CM | POA: Diagnosis not present

## 2018-02-12 DIAGNOSIS — M6281 Muscle weakness (generalized): Secondary | ICD-10-CM

## 2018-02-12 DIAGNOSIS — M25551 Pain in right hip: Secondary | ICD-10-CM

## 2018-02-12 DIAGNOSIS — M25651 Stiffness of right hip, not elsewhere classified: Secondary | ICD-10-CM

## 2018-02-12 DIAGNOSIS — M79661 Pain in right lower leg: Secondary | ICD-10-CM

## 2018-02-12 DIAGNOSIS — R2681 Unsteadiness on feet: Secondary | ICD-10-CM

## 2018-02-12 NOTE — Therapy (Signed)
[facility] [address] [place], [place], [address] Phone: [phone]   Fax:  [phone]  Physical Therapy Treatment  Patient Details  Name: Alison Ward MRN: [id number] Date of Birth: 18-Dec-1960 Referring Provider: Angelica Chessman, MD    Encounter Date: 02/12/2018  PT End of Session - 02/12/18 0928    Visit Number  7    Number of Visits  16    Date for PT Re-Evaluation  03/08/18    Authorization Type  Medicaid     PT Start Time  0850    PT Stop Time  0928    PT Time Calculation (min)  38 min    Activity Tolerance  Patient limited by pain;Patient tolerated treatment well;Patient limited by fatigue    Behavior During Therapy  [facility] for tasks assessed/performed       Past Medical History:  Diagnosis Date  . Chronic headaches    daily, bitemporal, associated with tingling in face  . Heart murmur   . Hypertension   . Seizures ([place])    partial seizures last one on 10/22/15  On Keppra currently   . Stroke [facility]) 07/05/2013    Past Surgical History:  Procedure Laterality Date  . ABDOMINAL HYSTERECTOMY N/A 12/15/2015   Procedure: HYSTERECTOMY ABDOMINAL;  Surgeon: Emily Filbert, MD;  Location: [facility];  Service: Gynecology;  Laterality: N/A;  . CESAREAN SECTION    . ceserean section    . SALPINGOOPHORECTOMY Bilateral 12/15/2015   Procedure: SALPINGO OOPHORECTOMY;  Surgeon: Emily Filbert, MD;  Location: [facility];  Service: Gynecology;  Laterality: Bilateral;  . VENTRICULOSTOMY Left 07/07/2013   Procedure: VENTRICULOSTOMY;  Surgeon: Ophelia Charter, MD;  Location: [facility];  Service: Neurosurgery;  Laterality: Left;  Left Ventriculostomy and Removal of Right Vetriculostomy    Vitals:   02/12/18 0853  BP: 117/81  Pulse: 73    Subjective Assessment - 02/12/18 0853    Subjective  Pt reports no falls since last PT session. pt reports her headaches continue to limit her ability to complete HEP at home. Pt also  reports she is under a lot of stress with takeing care of her mother and trying to take care of herself. pt reports she has not been consistant with HEP. When Pateint is under a lot of stress she reports  mild reduction in sensation in L UE and LE that comes and goes.     Pertinent History  L sided weakness, and RLE pain. 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 Falls in 2016.hysterectomy 2017    Limitations  Walking;Standing;Sitting;House hold activities    Patient Stated Goals  reduce pain and be more mobile witout pain.     Currently in Pain?  Yes    Pain Score  4     Pain Location  Leg    Pain Orientation  Right    Pain Descriptors / Indicators  Aching    Pain Type  Chronic pain    Pain Onset  More than a month ago    Pain Frequency  Intermittent                       [facility] Adult PT Treatment/Exercise - 02/12/18 0852      Bed Mobility   Bed Mobility  Rolling Right;Supine to Sit;Sit to Supine    Rolling Right  6: Modified independent (Device/Increase time)    Supine to  Sit  6: Modified independent (Device/Increase time)    Sit to Supine  6: Modified independent (Device/Increase time)    Sit to Supine - Details (indicate cue type and reason)  minimal verbal cues for log rolling technique      Transfers   Transfers  Sit to Stand;Stand to Sit    Sit to Stand  6: Modified independent (Device/Increase time)    Stand to Sit  6: Modified independent (Device/Increase time)      Ambulation/Gait   Ambulation/Gait  Yes    Ambulation/Gait Assistance  6: Modified independent (Device/Increase time)    Ambulation Distance (Feet)  120 Feet    Assistive device  None    Gait Pattern  Decreased arm swing - right;Step-through pattern;Decreased stride length;Decreased weight shift to right;Trendelenburg;Lateral hip instability;Lateral trunk lean to right;Narrow base of support;Poor foot  clearance - right;Decreased stance time - right;Antalgic    Ambulation Surface  Level;Indoor      Exercises   Exercises  Other Exercises    Other Exercises   Verbal cues, tactile cues for clamshell exercise ( 2x8) and PT assist for piriformis stretch, Glutes stretch and ITB stretch. all 2x 30s on R side.      Manual Therapy   Manual Therapy  Myofascial release    Manual therapy comments  pt continues to repond well to trigger point release of tight proximal hip musculature .    Myofascial Release  piriformis, quadratus lumborm             PT Education - 02/12/18 0929    Education provided  Yes    Education Details  HEP symptom reduction, Self-care    Person(s) Educated  Patient    Methods  Explanation    Comprehension  Verbalized understanding       PT Short Term Goals - 01/22/18 1540      PT SHORT TERM GOAL #1   Title  Patient will increase the number of hours slept without being woke by pain by 1 hour.  In order to improve QOL.    Baseline  currently pt is able to rest from 11pm to 2am then after that 3 hours she reports being up for various times based on pain.     Time  30    Period  Days    Status  New    Target Date  02/21/18      PT SHORT TERM GOAL #2   Title  Pt will be Independent with updated HEP in order to continue to reduce pain thought the day and night.    Baseline  pt is independent with inital HEP but needs updated exercises to further address medical issues.     Time  30    Period  Days    Status  New    Target Date  02/21/18      PT SHORT TERM GOAL #3   Title  Pt will verbalize methods used to reduce pain during acute night pain flare ups to enable her to return to sleep sooner.     Baseline  pt currently unable to reduce pain at night. She awakens after 3 hours sleep and is awake for 2-3 hours.     Time  30    Period  Days    Status  New    Target Date  02/21/18        PT Long Term Goals - 01/22/18 1347      PT LONG TERM GOAL #1  Title   pt will be independent with HEP/ ongoing fitness plan ( ALL TARGET 03/08/2018)    Baseline  No long term HEP established and patient has medical condition requiring skilled PT instruction    Time  11    Period  Weeks    Status  On-going    Target Date  03/08/18      PT LONG TERM GOAL #2   Title  Patient reports 50% improvement in night pain to enable improved sleep.     Baseline  Patient reports night pain 10/10 waking her after 3-4 hours of sleep and stays awake for 1-3 hours.     Time  11    Period  Weeks    Status  On-going    Target Date  03/08/18      PT LONG TERM GOAL #3   Title  Pt ambulates 1000' outdoors on grass, ramps, curbs, and paved ground without device modified independent to enable community mobility.     Baseline  Patient ambulates 120' at 3.07 ft/sec with abnormal gait pattern & reports pain up to 8-10/10 when ambulating in community.     Time  11    Period  Weeks    Status  On-going    Target Date  03/08/18      PT LONG TERM GOAL #4   Title  Patient reports pain improvement by 50% with standing and positions during awake hours to enable ability to perform ADLs with less pain limiting her function.     Baseline  Patient reports pain 8-10/10 with standing ADLS limiting to 3-5 minutes.     Time  11    Period  Weeks    Status  On-going    Target Date  03/08/18      PT LONG TERM GOAL #5   Title  Patient verbalizes methods to reduce pain to improve her quality of life.     Baseline  Patient is unknowledgeable in pain reduction techniques and has high levels of pain limiting daily functions.     Time  11    Period  Weeks    Status  On-going    Target Date  03/08/18      PT LONG TERM GOAL #6   Title  Patient verbalizes increased 50% confindence in balance without fear of falling to improve activities.     Baseline  Patient reports high fear of falling with activities & reluctance to leave her home due to fear of falling.     Time  11    Period  Weeks    Status   On-going    Target Date  03/08/18      PT LONG TERM GOAL #7   Title  Pt will improve FGA score to 27/30 to decr. falls risk.     Baseline  19/30    Status  On-going    Target Date  03/08/18            Plan - 02/12/18 1522    Clinical Impression Statement  During today's treatment session patient presents with continued R hip/leg pain. PT and pt review her some of her HEP stretches and exercises after  manual trigger point therapy at patients R Gluts, piriformis, and quadratus lumborum (to which patient responded well to with moderate reduction in R hip tightness and tenderness.) Pt and PT review proximal hip stretching and strengthening exercises as well as iliotibial band ice message at home. Pt has been instructed to spread  her HEP exercises out throughout the day in order to increase activity and reduce immobility due to pain.     Rehab Potential  Good    Clinical Impairments Affecting Rehab Potential  Positive additude, pain levels    PT Frequency  Other (comment)    PT Duration  Other (comment)    PT Treatment/Interventions  Therapeutic activities;Therapeutic exercise;Balance training;Neuromuscular re-education;Patient/family education;Functional mobility training;ADLs/Self Care Home Management;Passive range of motion;Energy conservation;Gait training;DME Instruction;Stair training;Manual techniques;Moist Heat;Ultrasound    PT Next Visit Plan  Continuet to work on [employer] as well as proximal hip strengthening.     Consulted and Agree with Plan of Care  Patient       Patient will benefit from skilled therapeutic intervention in order to improve the following deficits and impairments:  Abnormal gait, Decreased range of motion, Difficulty walking, Dizziness, Decreased activity tolerance, Decreased balance, Decreased strength, Decreased mobility, Pain, Decreased endurance, Impaired flexibility, Postural dysfunction, Obesity  Visit Diagnosis: Other abnormalities of gait and  mobility  Pain in right hip  Unsteadiness on feet  Muscle weakness (generalized)  Pain in right lower leg  Stiffness of right hip, not elsewhere classified     Problem List Patient Active Problem List   Diagnosis Date Noted  . Ganglion cyst of right foot 07/04/2017  . Chronic tension-type headache, not intractable 12/30/2015  . Post-operative state 12/15/2015  . Simple partial seizure disorder ([place]) 11/04/2015  . Left sided numbness 09/25/2015  . Acute kidney injury ([place]) 09/25/2015  . Leukopenia 09/25/2015  . Tension vascular headache 09/07/2015  . Meralgia paresthetica of left side 09/07/2015  . Fainting spell 09/07/2015  . Prediabetes 09/02/2015  . Stye 08/06/2014  . Essential hypertension, benign 04/16/2014  . CVA (cerebral vascular accident) ([place]) 04/16/2014  . Headache 04/16/2014  . Tension headache 03/06/2014  . Obesity, unspecified 08/13/2013  . 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 ICH (intracerebral hemorrhage) ([place]) 07/21/2013  . Hypokalemia 07/21/2013  . Intracerebral hemorrhage ([place]) 07/06/2013  . Essential hypertension 02/24/2009    Waunita Schooner SPT 02/12/2018, 3:30 PM  [facility] [address] [place], [place], [address] Phone: [phone]   Fax:  [phone]  Name: Alison Ward MRN: [id number] Date of Birth: [birth year]

## 2018-02-14 ENCOUNTER — Encounter: Payer: Self-pay | Admitting: Physical Therapy

## 2018-02-14 ENCOUNTER — Ambulatory Visit: Payer: Medicaid Other | Admitting: Physical Therapy

## 2018-02-14 DIAGNOSIS — M79661 Pain in right lower leg: Secondary | ICD-10-CM

## 2018-02-14 DIAGNOSIS — R2689 Other abnormalities of gait and mobility: Secondary | ICD-10-CM | POA: Diagnosis not present

## 2018-02-14 DIAGNOSIS — R2681 Unsteadiness on feet: Secondary | ICD-10-CM

## 2018-02-14 DIAGNOSIS — M6281 Muscle weakness (generalized): Secondary | ICD-10-CM

## 2018-02-14 DIAGNOSIS — M25651 Stiffness of right hip, not elsewhere classified: Secondary | ICD-10-CM

## 2018-02-14 DIAGNOSIS — M25551 Pain in right hip: Secondary | ICD-10-CM

## 2018-02-14 NOTE — Therapy (Signed)
[facility] [address] [place], [place], [address] Phone: [phone]   Fax:  [phone]  Physical Therapy Treatment  Patient Details  Name: Alison Ward MRN: [id number] Date of Birth: 1961/01/30 Referring Provider: Angelica Chessman, MD    Encounter Date: 02/14/2018  PT End of Session - 02/14/18 0909    Visit Number  8    Number of Visits  16    Date for PT Re-Evaluation  03/08/18    Authorization Type  Medicaid     PT Start Time  0850    PT Stop Time  0928    PT Time Calculation (min)  38 min    Activity Tolerance  Patient tolerated treatment well    Behavior During Therapy  [facility] for tasks assessed/performed       Past Medical History:  Diagnosis Date  . Chronic headaches    daily, bitemporal, associated with tingling in face  . Heart murmur   . Hypertension   . Seizures ([place])    partial seizures last one on 10/22/15  On Keppra currently   . Stroke [facility]) 07/05/2013    Past Surgical History:  Procedure Laterality Date  . ABDOMINAL HYSTERECTOMY N/A 12/15/2015   Procedure: HYSTERECTOMY ABDOMINAL;  Surgeon: Emily Filbert, MD;  Location: [facility];  Service: Gynecology;  Laterality: N/A;  . CESAREAN SECTION    . ceserean section    . SALPINGOOPHORECTOMY Bilateral 12/15/2015   Procedure: SALPINGO OOPHORECTOMY;  Surgeon: Emily Filbert, MD;  Location: [facility];  Service: Gynecology;  Laterality: Bilateral;  . VENTRICULOSTOMY Left 07/07/2013   Procedure: VENTRICULOSTOMY;  Surgeon: Ophelia Charter, MD;  Location: [facility];  Service: Neurosurgery;  Laterality: Left;  Left Ventriculostomy and Removal of Right Vetriculostomy    There were no vitals filed for this visit.  Subjective Assessment - 02/14/18 0858    Subjective  Pt reports that she has continued to increase the number of hours she has been able to sleep and has done her HEP in bed rather then walking around when pain wakes her.     Pertinent History  L  sided weakness, and RLE pain. 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 Falls in 2016.hysterectomy 2017    Limitations  Walking;Standing;Sitting;House hold activities    Patient Stated Goals  reduce pain and be more mobile witout pain.     Currently in Pain?  Yes    Pain Score  -- not above a 3                        [facility] Adult PT Treatment/Exercise - 02/14/18 0850      Transfers   Transfers  Sit to Stand;Stand to Sit    Sit to Stand  6: Modified independent (Device/Increase time)    Stand to Sit  6: Modified independent (Device/Increase time)      Ambulation/Gait   Ambulation/Gait  Yes    Ambulation/Gait Assistance  6: Modified independent (Device/Increase time)    Ambulation Distance (Feet)  120 Feet    Assistive device  None    Gait Pattern  Decreased arm swing - right;Step-through pattern;Decreased stride length;Decreased weight shift to right;Trendelenburg;Lateral hip instability;Lateral trunk lean to right;Narrow base of support;Poor foot clearance - right;Decreased stance time - right;Antalgic    Ambulation Surface  Level;Indoor      Exercises   Exercises  Other Exercises    Other  Exercises   SCIFIT:    27min.  at level 1.  see patient instruction for details on new HEP (verbal and tactile cues)  stopped due to light headache      Manual Therapy   Manual Therapy  Myofascial release    Manual therapy comments  pt continues to repond well to trigger point release of tight Rt. ITB with HEP exercises to fa    Myofascial Release  Distal Rt. ITB             PT Education - 02/14/18 0909    Education provided  Yes    Education Details  SCIFIT set up, Fitness center equipment and ues , HEP     Person(s) Educated  Patient    Methods  Explanation;Demonstration    Comprehension  Verbalized understanding;Returned demonstration       PT Short Term Goals -  01/22/18 1540      PT SHORT TERM GOAL #1   Title  Patient will increase the number of hours slept without being woke by pain by 1 hour.  In order to improve QOL.    Baseline  currently pt is able to rest from 11pm to 2am then after that 3 hours she reports being up for various times based on pain.     Time  30    Period  Days    Status  New    Target Date  02/21/18      PT SHORT TERM GOAL #2   Title  Pt will be Independent with updated HEP in order to continue to reduce pain thought the day and night.    Baseline  pt is independent with inital HEP but needs updated exercises to further address medical issues.     Time  30    Period  Days    Status  New    Target Date  02/21/18      PT SHORT TERM GOAL #3   Title  Pt will verbalize methods used to reduce pain during acute night pain flare ups to enable her to return to sleep sooner.     Baseline  pt currently unable to reduce pain at night. She awakens after 3 hours sleep and is awake for 2-3 hours.     Time  30    Period  Days    Status  New    Target Date  02/21/18        PT Long Term Goals - 01/22/18 1347      PT LONG TERM GOAL #1   Title  pt will be independent with HEP/ ongoing fitness plan ( ALL TARGET 03/08/2018)    Baseline  No long term HEP established and patient has medical condition requiring skilled PT instruction    Time  11    Period  Weeks    Status  On-going    Target Date  03/08/18      PT LONG TERM GOAL #2   Title  Patient reports 50% improvement in night pain to enable improved sleep.     Baseline  Patient reports night pain 10/10 waking her after 3-4 hours of sleep and stays awake for 1-3 hours.     Time  11    Period  Weeks    Status  On-going    Target Date  03/08/18      PT LONG TERM GOAL #3   Title  Pt ambulates 1000' outdoors on grass, ramps, curbs, and paved ground without  device modified independent to enable community mobility.     Baseline  Patient ambulates 120' at 3.07 ft/sec with abnormal  gait pattern & reports pain up to 8-10/10 when ambulating in community.     Time  11    Period  Weeks    Status  On-going    Target Date  03/08/18      PT LONG TERM GOAL #4   Title  Patient reports pain improvement by 50% with standing and positions during awake hours to enable ability to perform ADLs with less pain limiting her function.     Baseline  Patient reports pain 8-10/10 with standing ADLS limiting to 3-5 minutes.     Time  11    Period  Weeks    Status  On-going    Target Date  03/08/18      PT LONG TERM GOAL #5   Title  Patient verbalizes methods to reduce pain to improve her quality of life.     Baseline  Patient is unknowledgeable in pain reduction techniques and has high levels of pain limiting daily functions.     Time  11    Period  Weeks    Status  On-going    Target Date  03/08/18      PT LONG TERM GOAL #6   Title  Patient verbalizes increased 50% confindence in balance without fear of falling to improve activities.     Baseline  Patient reports high fear of falling with activities & reluctance to leave her home due to fear of falling.     Time  11    Period  Weeks    Status  On-going    Target Date  03/08/18      PT LONG TERM GOAL #7   Title  Pt will improve FGA score to 27/30 to decr. falls risk.     Baseline  19/30    Status  On-going    Target Date  03/08/18            Plan - 02/14/18 1313    Clinical Impression Statement  During today's PT session pt and PT review different gym equipment that is safe for PT to use and established plan to slowly progress duration on recumbent exercises equipment. Patient continues to tolerate manual trigger point release therapy and was able to progress her proximal hip strengthening exercises.     Rehab Potential  Good    Clinical Impairments Affecting Rehab Potential  Positive additude, pain levels    PT Frequency  Other (comment)    PT Duration  Other (comment)    PT Treatment/Interventions  Therapeutic  activities;Therapeutic exercise;Balance training;Neuromuscular re-education;Patient/family education;Functional mobility training;ADLs/Self Care Home Management;Passive range of motion;Energy conservation;Gait training;DME Instruction;Stair training;Manual techniques;Moist Heat;Ultrasound    PT Next Visit Plan Check STGs, review proximal hip HEP, and reassess R ITB tightness and proximal hip tightness and tenderness.     Consulted and Agree with Plan of Care  Patient       Patient will benefit from skilled therapeutic intervention in order to improve the following deficits and impairments:  Abnormal gait, Decreased range of motion, Difficulty walking, Dizziness, Decreased activity tolerance, Decreased balance, Decreased strength, Decreased mobility, Pain, Decreased endurance, Impaired flexibility, Postural dysfunction, Obesity  Visit Diagnosis: Other abnormalities of gait and mobility  Pain in right hip  Unsteadiness on feet  Muscle weakness (generalized)  Pain in right lower leg  Stiffness of right hip, not elsewhere classified  Problem List Patient Active Problem List   Diagnosis Date Noted  . Ganglion cyst of right foot 07/04/2017  . Chronic tension-type headache, not intractable 12/30/2015  . Post-operative state 12/15/2015  . Simple partial seizure disorder ([place]) 11/04/2015  . Left sided numbness 09/25/2015  . Acute kidney injury ([place]) 09/25/2015  . Leukopenia 09/25/2015  . Tension vascular headache 09/07/2015  . Meralgia paresthetica of left side 09/07/2015  . Fainting spell 09/07/2015  . Prediabetes 09/02/2015  . Stye 08/06/2014  . Essential hypertension, benign 04/16/2014  . CVA (cerebral vascular accident) ([place]) 04/16/2014  . Headache 04/16/2014  . Tension headache 03/06/2014  . Obesity, unspecified 08/13/2013  . 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 ICH (intracerebral hemorrhage) ([place]) 07/21/2013  . Hypokalemia 07/21/2013  .  Intracerebral hemorrhage ([place]) 07/06/2013  . Essential hypertension 02/24/2009    Waunita Schooner SPT 02/14/2018, 2:41 PM  [facility] [address] [place], [place], [address] Phone: [phone]   Fax:  [phone]  Name: Alison Ward MRN: [id number] Date of Birth: 1961/01/07

## 2018-02-14 NOTE — Patient Instructions (Signed)
Hip Abduction: Modified    Lying on right side with pillow between thighs, raise top leg from pillow, rotating slightly out. Repeat ___5_ times per set. Do __4__ sets per session. Do _4-5___ sessions per week  http://orth.exer.us/705   Copyright  VHI. All rights reserved.

## 2018-02-19 ENCOUNTER — Ambulatory Visit: Payer: Medicaid Other | Admitting: Physical Therapy

## 2018-02-19 ENCOUNTER — Encounter: Payer: Self-pay | Admitting: Physical Therapy

## 2018-02-19 DIAGNOSIS — M6281 Muscle weakness (generalized): Secondary | ICD-10-CM

## 2018-02-19 DIAGNOSIS — R2681 Unsteadiness on feet: Secondary | ICD-10-CM

## 2018-02-19 DIAGNOSIS — M25551 Pain in right hip: Secondary | ICD-10-CM

## 2018-02-19 DIAGNOSIS — R2689 Other abnormalities of gait and mobility: Secondary | ICD-10-CM

## 2018-02-19 NOTE — Therapy (Signed)
[facility] [address] [place], [place], [address] Phone: [phone]   Fax:  [phone]  Physical Therapy Treatment  Patient Details  Name: Alison Ward MRN: [id number] Date of Birth: 1960-12-11 Referring Provider: Angelica Chessman, MD    Encounter Date: 02/19/2018  PT End of Session - 02/19/18 1239    Visit Number  9    Number of Visits  16    Date for PT Re-Evaluation  03/08/18    Authorization Type  Medicaid     Authorization Time Period  01/30/2018 -03/12/2018 12 visits (in addition to eval plus 3 visits)    Authorization - Visit Number  8    Authorization - Number of Visits  15 initial 3 + additional 12    PT Start Time  0935    PT Stop Time  1015    PT Time Calculation (min)  40 min    Activity Tolerance  Patient tolerated treatment well    Behavior During Therapy  [facility] for tasks assessed/performed       Past Medical History:  Diagnosis Date  . Chronic headaches    daily, bitemporal, associated with tingling in face  . Heart murmur   . Hypertension   . Seizures ([place])    partial seizures last one on 10/22/15  On Keppra currently   . Stroke [facility]) 07/05/2013    Past Surgical History:  Procedure Laterality Date  . ABDOMINAL HYSTERECTOMY N/A 12/15/2015   Procedure: HYSTERECTOMY ABDOMINAL;  Surgeon: Emily Filbert, MD;  Location: [facility];  Service: Gynecology;  Laterality: N/A;  . CESAREAN SECTION    . ceserean section    . SALPINGOOPHORECTOMY Bilateral 12/15/2015   Procedure: SALPINGO OOPHORECTOMY;  Surgeon: Emily Filbert, MD;  Location: [facility];  Service: Gynecology;  Laterality: Bilateral;  . VENTRICULOSTOMY Left 07/07/2013   Procedure: VENTRICULOSTOMY;  Surgeon: Ophelia Charter, MD;  Location: [facility];  Service: Neurosurgery;  Laterality: Left;  Left Ventriculostomy and Removal of Right Vetriculostomy    There were no vitals filed for this visit.  Subjective Assessment - 02/19/18 0938    Subjective  She has been sleeping 6+ hours now with positioning & modifications that PT has taught her. No gym yet. She is planning to join [employer].   (Pended)     Pertinent History  L sided weakness, and RLE pain. 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 Falls in 2016.hysterectomy 2017  (Pended)     Limitations  Walking;Standing;Sitting;House hold activities  (Pended)     Patient Stated Goals  reduce pain and be more mobile witout pain.   (Pended)     Currently in Pain?  Yes  (Pended)     Pain Score  3   (Pended)     Pain Location  Leg  (Pended)     Pain Orientation  Right  (Pended)     Pain Descriptors / Indicators  Aching  (Pended)     Pain Type  Chronic pain  (Pended)     Pain Onset  More than a month ago  (Pended)     Pain Frequency  Intermittent  (Pended)     Aggravating Factors   certain hip movements  (Pended)       Patient verbalizes proper positioning & transition movements for bed mobility. Sitting posture: patient has habit of crossing her ankles with ankle plantarflexion. PT used mirrors & verbal cues to instruct in  proper sitting posture. Some times we shift our posture to get foot support from floor/ground and plantarflexing her feet enable contact. PT instructed with demo on pt use of 4" block to support feet with hips at 90*. Pt reports improved hip & back comfort. PT recommended placing stuffed boxes near familiar / common chairs.  PT demo, instructed in lumbar extension / flexion (pelvic rocking) with hold ea 2 breaths & 5-10 reps. PT recommended performing with sitting long periods like church. Pt return demo & verbalized understanding.    PT demo, instructed prior and mirror, tactile & verbal cues during exercises: 75cm theraball sitting in front of mat table between 2 chairs. Pt initiated ea exercise with UE support on chairs and progressed to no UE support-  anterior/posterior roll, medial/lateral roll, circular rolls cw/ccw, core stabilization with reciprocal shoulder flexion, core stabilization with reciprocal step forward                        PT Education - 02/19/18 1005    Education provided  Yes    Education Details  Sitting posture / LE support -positioning, sitting back extension /flexion pelvic rocks, sitting on 75cm theraball    Person(s) Educated  Patient    Methods  Explanation;Demonstration;Verbal cues;Tactile cues    Comprehension  Verbalized understanding;Returned demonstration;Need further instruction       PT Short Term Goals - 02/19/18 1249      PT SHORT TERM GOAL #1   Title  Patient will increase the number of hours slept without being woke by pain by 1 hour.  In order to improve QOL.    Baseline  MET 02/19/2018  Patient reports 6 hrs /night sleep    Time  30    Period  Days    Status  Achieved      PT SHORT TERM GOAL #2   Title  Pt will be Independent with updated HEP in order to continue to reduce pain thought the day and night.    Baseline  MET 02/19/2018    Time  30    Period  Days    Status  Achieved      PT SHORT TERM GOAL #3   Title  Pt will verbalize methods used to reduce pain during acute night pain flare ups to enable her to return to sleep sooner.     Baseline  MET 02/19/2018    Time  30    Period  Days    Status  Achieved        PT Long Term Goals - 01/22/18 1347      PT LONG TERM GOAL #1   Title  pt will be independent with HEP/ ongoing fitness plan ( ALL TARGET 03/08/2018)    Baseline  No long term HEP established and patient has medical condition requiring skilled PT instruction    Time  11    Period  Weeks    Status  On-going    Target Date  03/08/18      PT LONG TERM GOAL #2   Title  Patient reports 50% improvement in night pain to enable improved sleep.     Baseline  Patient reports night pain 10/10 waking her after 3-4 hours of sleep and stays awake for 1-3 hours.      Time  11    Period  Weeks    Status  On-going    Target Date  03/08/18      PT  LONG TERM GOAL #3   Title  Pt ambulates 1000' outdoors on grass, ramps, curbs, and paved ground without device modified independent to enable community mobility.     Baseline  Patient ambulates 120' at 3.07 ft/sec with abnormal gait pattern & reports pain up to 8-10/10 when ambulating in community.     Time  11    Period  Weeks    Status  On-going    Target Date  03/08/18      PT LONG TERM GOAL #4   Title  Patient reports pain improvement by 50% with standing and positions during awake hours to enable ability to perform ADLs with less pain limiting her function.     Baseline  Patient reports pain 8-10/10 with standing ADLS limiting to 3-5 minutes.     Time  11    Period  Weeks    Status  On-going    Target Date  03/08/18      PT LONG TERM GOAL #5   Title  Patient verbalizes methods to reduce pain to improve her quality of life.     Baseline  Patient is unknowledgeable in pain reduction techniques and has high levels of pain limiting daily functions.     Time  11    Period  Weeks    Status  On-going    Target Date  03/08/18      PT LONG TERM GOAL #6   Title  Patient verbalizes increased 50% confindence in balance without fear of falling to improve activities.     Baseline  Patient reports high fear of falling with activities & reluctance to leave her home due to fear of falling.     Time  11    Period  Weeks    Status  On-going    Target Date  03/08/18      PT LONG TERM GOAL #7   Title  Pt will improve FGA score to 27/30 to decr. falls risk.     Baseline  19/30    Status  On-going    Target Date  03/08/18            Plan - 02/19/18 1252    Clinical Impression Statement  Patient met all STGs set for this certification period. She verbalizes understanding of sitting posture & simple exercises for her back/hip sitting.     Rehab Potential  Good    Clinical Impairments Affecting Rehab  Potential  Positive additude, pain levels    PT Frequency  Other (comment)    PT Duration  Other (comment)    PT Treatment/Interventions  Therapeutic activities;Therapeutic exercise;Balance training;Neuromuscular re-education;Patient/family education;Functional mobility training;ADLs/Self Care Home Management;Passive range of motion;Energy conservation;Gait training;DME Instruction;Stair training;Manual techniques;Moist Heat;Ultrasound    PT Next Visit Plan  review proximal hip HEP, and reassess R ITB tightness and proximal hip tightness and tenderness.     Consulted and Agree with Plan of Care  Patient       Patient will benefit from skilled therapeutic intervention in order to improve the following deficits and impairments:  Abnormal gait, Decreased range of motion, Difficulty walking, Dizziness, Decreased activity tolerance, Decreased balance, Decreased strength, Decreased mobility, Pain, Decreased endurance, Impaired flexibility, Postural dysfunction, Obesity  Visit Diagnosis: Other abnormalities of gait and mobility  Pain in right hip  Unsteadiness on feet  Muscle weakness (generalized)     Problem List Patient Active Problem List   Diagnosis Date Noted  . Ganglion cyst of right foot 07/04/2017  . Chronic  tension-type headache, not intractable 12/30/2015  . Post-operative state 12/15/2015  . Simple partial seizure disorder ([place]) 11/04/2015  . Left sided numbness 09/25/2015  . Acute kidney injury ([place]) 09/25/2015  . Leukopenia 09/25/2015  . Tension vascular headache 09/07/2015  . Meralgia paresthetica of left side 09/07/2015  . Fainting spell 09/07/2015  . Prediabetes 09/02/2015  . Stye 08/06/2014  . Essential hypertension, benign 04/16/2014  . CVA (cerebral vascular accident) ([place]) 04/16/2014  . Headache 04/16/2014  . Tension headache 03/06/2014  . Obesity, unspecified 08/13/2013  . 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[place]) 07/22/2013  . ICH (intracerebral  hemorrhage) ([place]) 07/21/2013  . Hypokalemia 07/21/2013  . Intracerebral hemorrhage ([place]) 07/06/2013  . Essential hypertension 02/24/2009    Jamey Reas PT, DPT 02/19/2018, 12:55 PM  [facility] [address] [place], [place], [address] Phone: [phone]   Fax:  [phone]  Name: Alison Ward MRN: [id number] Date of Birth: 1961/03/31

## 2018-02-20 MED FILL — ATORVASTATIN 40 MG TABLET: 40 | 30 days supply | Qty: 30 | Fill #7

## 2018-02-21 ENCOUNTER — Ambulatory Visit: Payer: Medicaid Other | Admitting: Physical Therapy

## 2018-02-21 ENCOUNTER — Encounter: Payer: Self-pay | Admitting: Physical Therapy

## 2018-02-21 DIAGNOSIS — M6281 Muscle weakness (generalized): Secondary | ICD-10-CM

## 2018-02-21 DIAGNOSIS — R2689 Other abnormalities of gait and mobility: Secondary | ICD-10-CM

## 2018-02-21 DIAGNOSIS — R2681 Unsteadiness on feet: Secondary | ICD-10-CM

## 2018-02-21 DIAGNOSIS — M25651 Stiffness of right hip, not elsewhere classified: Secondary | ICD-10-CM

## 2018-02-21 DIAGNOSIS — M25551 Pain in right hip: Secondary | ICD-10-CM

## 2018-02-21 NOTE — Patient Instructions (Addendum)
Rotation: stretch (Sitting)    Place stool to side of leg. Place foot on stool. You can move foot out to side to increase stretch. Gently roll knee inward until feel a stretch. Hold 3 deep breaths. Do 3-5 reps. Repeat on other leg.   Copyright  VHI. All rights reserved.     Gastroc Stretch - Standing    Stand with uninvolved leg straight, heel on floor, toes pointed slightly inward. Lean into wall until stretch is felt in calf. Hold for _2-3 deep breaths. Repeat on other leg. Repeat _3-5__ times on each leg.   Copyright  VHI. All rights reserved.    ANKLE: Dorsiflexion, Step Unilateral    Stand on step, hang one heel off back of step. Hold __Hold for _2-3 deep breaths. Repeat on other leg. Repeat _3-5__ times on each leg.   Copyright  VHI. All rights reserved.   Iliotibial Band Stretch    Stand with right hip __6__ inches from wall. Cross other leg in front and use it and arm for support. Lean toward wall until a stretch is felt on outside of hip near wall. Keep that leg straight. Hold _Hold for _2-3 deep breaths. Repeat on other leg. Repeat _3-5__ times on each leg.  http://gt2.exer.us/355   Copyright  VHI. All rights reserved.

## 2018-02-22 NOTE — Therapy (Signed)
[facility] [address] [place], [place], [address] Phone: [phone]   Fax:  [phone]  Physical Therapy Treatment  Patient Details  Name: GISSELLA NIBLACK MRN: [id number] Date of Birth: Feb 02, 1961 Referring Provider: Angelica Chessman, MD    Encounter Date: 02/21/2018  PT End of Session - 02/21/18 1850    Visit Number  10    Number of Visits  16    Date for PT Re-Evaluation  03/08/18    Authorization Type  Medicaid     Authorization Time Period  01/30/2018 -03/12/2018 12 visits (in addition to eval plus 3 visits)    Authorization - Visit Number  9    Authorization - Number of Visits  15 initial 3 + additional 12    PT Start Time  0945    PT Stop Time  1015    PT Time Calculation (min)  30 min    Activity Tolerance  Patient tolerated treatment well    Behavior During Therapy  [facility] for tasks assessed/performed       Past Medical History:  Diagnosis Date  . Chronic headaches    daily, bitemporal, associated with tingling in face  . Heart murmur   . Hypertension   . Seizures ([place])    partial seizures last one on 10/22/15  On Keppra currently   . Stroke [facility]) 07/05/2013    Past Surgical History:  Procedure Laterality Date  . ABDOMINAL HYSTERECTOMY N/A 12/15/2015   Procedure: HYSTERECTOMY ABDOMINAL;  Surgeon: Emily Filbert, MD;  Location: [facility];  Service: Gynecology;  Laterality: N/A;  . CESAREAN SECTION    . ceserean section    . SALPINGOOPHORECTOMY Bilateral 12/15/2015   Procedure: SALPINGO OOPHORECTOMY;  Surgeon: Emily Filbert, MD;  Location: [facility];  Service: Gynecology;  Laterality: Bilateral;  . VENTRICULOSTOMY Left 07/07/2013   Procedure: VENTRICULOSTOMY;  Surgeon: Ophelia Charter, MD;  Location: [facility];  Service: Neurosurgery;  Laterality: Left;  Left Ventriculostomy and Removal of Right Vetriculostomy    There were no vitals filed for this visit.  Subjective Assessment - 02/21/18 0946    Subjective  She is slept last night 7 hrs. She verbalizes methods to manage pain when first feels it. Her exercises are going well.     Pertinent History  L sided weakness, and RLE pain. 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 Falls in 2016.hysterectomy 2017    Limitations  Walking;Standing;Sitting;House hold activities    Patient Stated Goals  reduce pain and be more mobile witout pain.     Currently in Pain?  Yes    Pain Score  1  in last week, worst 5/10 (seconds as she does exercises to relief), best 0/10    Pain Location  Leg    Pain Orientation  Right    Pain Descriptors / Indicators  Aching    Pain Type  Chronic pain    Pain Onset  More than a month ago    Pain Frequency  Intermittent    Aggravating Factors   first movements at hip    Pain Relieving Factors  walking long distances    Effect of Pain on Daily Activities  limited ADLs       Patient arrived 15 minutes late to PT appointment. Therapeutic Exercise: PT demo, instructed prior and mirror, tactile & verbal cues during exercises: 75cm theraball sitting in front of mat table between 2 chairs. Pt initiated ea  exercise with UE support on chairs and progressed to no UE support- anterior/posterior roll, medial/lateral roll, circular rolls cw/ccw, core stabilization with reciprocal shoulder flexion, core stabilization with reciprocal step forward     Rotation: stretch (Sitting)    Place stool to side of leg. Place foot on stool. You can move foot out to side to increase stretch. Gently roll knee inward until feel a stretch. Hold 3 deep breaths. Do 3-5 reps. Repeat on other leg.   Copyright  VHI. All rights reserved.     Gastroc Stretch - Standing    Stand with uninvolved leg straight, heel on floor, toes pointed slightly inward. Lean into wall until stretch is felt in calf. Hold for _2-3 deep breaths. Repeat on other  leg. Repeat _3-5__ times on each leg.   Copyright  VHI. All rights reserved.    ANKLE: Dorsiflexion, Step Unilateral    Stand on step, hang one heel off back of step. Hold __Hold for _2-3 deep breaths. Repeat on other leg. Repeat _3-5__ times on each leg.   Copyright  VHI. All rights reserved.   Iliotibial Band Stretch    Stand with right hip __6__ inches from wall. Cross other leg in front and use it and arm for support. Lean toward wall until a stretch is felt on outside of hip near wall. Keep that leg straight. Hold _Hold for _2-3 deep breaths. Repeat on other leg. Repeat _3-5__ times on each leg.  http://[email or web address].exer.us/355   Copyright  VHI. All rights reserved.                       PT Education - 02/21/18 1015    Education provided  Yes    Education Details  HEP added seated hip external rotation stretch, standing gastroc & ITB stretch    Person(s) Educated  Patient    Methods  Explanation;Demonstration;Verbal cues;Handout    Comprehension  Verbalized understanding       PT Short Term Goals - 02/21/18 1854      PT SHORT TERM GOAL #1   Title  Patient will increase the number of hours slept without being woke by pain by 1 hour.  In order to improve QOL.    Baseline  MET 02/21/2018    Time  30    Period  Days    Status  Achieved      PT SHORT TERM GOAL #2   Title  Pt will be Independent with updated HEP in order to continue to reduce pain thought the day and night.    Baseline  MET 02/21/2018    Time  30    Period  Days    Status  Achieved      PT SHORT TERM GOAL #3   Title  Pt will verbalize methods used to reduce pain during acute night pain flare ups to enable her to return to sleep sooner.     Baseline  MET 02/21/2018    Time  30    Period  Days    Status  Achieved        PT Long Term Goals - 01/22/18 1347      PT LONG TERM GOAL #1   Title  pt will be independent with HEP/ ongoing fitness plan ( ALL TARGET 03/08/2018)    Baseline   No long term HEP established and patient has medical condition requiring skilled PT instruction    Time  11    Period  Weeks  Status  On-going    Target Date  03/08/18      PT LONG TERM GOAL #2   Title  Patient reports 50% improvement in night pain to enable improved sleep.     Baseline  Patient reports night pain 10/10 waking her after 3-4 hours of sleep and stays awake for 1-3 hours.     Time  11    Period  Weeks    Status  On-going    Target Date  03/08/18      PT LONG TERM GOAL #3   Title  Pt ambulates 1000' outdoors on grass, ramps, curbs, and paved ground without device modified independent to enable community mobility.     Baseline  Patient ambulates 120' at 3.07 ft/sec with abnormal gait pattern & reports pain up to 8-10/10 when ambulating in community.     Time  11    Period  Weeks    Status  On-going    Target Date  03/08/18      PT LONG TERM GOAL #4   Title  Patient reports pain improvement by 50% with standing and positions during awake hours to enable ability to perform ADLs with less pain limiting her function.     Baseline  Patient reports pain 8-10/10 with standing ADLS limiting to 3-5 minutes.     Time  11    Period  Weeks    Status  On-going    Target Date  03/08/18      PT LONG TERM GOAL #5   Title  Patient verbalizes methods to reduce pain to improve her quality of life.     Baseline  Patient is unknowledgeable in pain reduction techniques and has high levels of pain limiting daily functions.     Time  11    Period  Weeks    Status  On-going    Target Date  03/08/18      PT LONG TERM GOAL #6   Title  Patient verbalizes increased 50% confindence in balance without fear of falling to improve activities.     Baseline  Patient reports high fear of falling with activities & reluctance to leave her home due to fear of falling.     Time  11    Period  Weeks    Status  On-going    Target Date  03/08/18      PT LONG TERM GOAL #7   Title  Pt will improve FGA  score to 27/30 to decr. falls risk.     Baseline  19/30    Status  On-going    Target Date  03/08/18            Plan - 02/21/18 1855    Clinical Impression Statement  Patient met all STGs set for last 3 weeks. Patient is on target to meet LTGs. Patient seems to understand new stretches added today.     Rehab Potential  Good    Clinical Impairments Affecting Rehab Potential  Positive additude, pain levels    PT Treatment/Interventions  Therapeutic activities;Therapeutic exercise;Balance training;Neuromuscular re-education;Patient/family education;Functional mobility training;ADLs/Self Care Home Management;Passive range of motion;Energy conservation;Gait training;DME Instruction;Stair training;Manual techniques;Moist Heat;Ultrasound    PT Next Visit Plan  reassess R ITB tightness and proximal hip tightness and tenderness.     Consulted and Agree with Plan of Care  Patient       Patient will benefit from skilled therapeutic intervention in order to improve the following deficits and impairments:  Abnormal gait,  Decreased range of motion, Difficulty walking, Dizziness, Decreased activity tolerance, Decreased balance, Decreased strength, Decreased mobility, Pain, Decreased endurance, Impaired flexibility, Postural dysfunction, Obesity  Visit Diagnosis: Other abnormalities of gait and mobility  Unsteadiness on feet  Muscle weakness (generalized)  Pain in right hip  Stiffness of right hip, not elsewhere classified     Problem List Patient Active Problem List   Diagnosis Date Noted  . Ganglion cyst of right foot 07/04/2017  . Chronic tension-type headache, not intractable 12/30/2015  . Post-operative state 12/15/2015  . Simple partial seizure disorder ([place]) 11/04/2015  . Left sided numbness 09/25/2015  . Acute kidney injury ([place]) 09/25/2015  . Leukopenia 09/25/2015  . Tension vascular headache 09/07/2015  . Meralgia paresthetica of left side 09/07/2015  . Fainting spell  09/07/2015  . Prediabetes 09/02/2015  . Stye 08/06/2014  . Essential hypertension, benign 04/16/2014  . CVA (cerebral vascular accident) ([place]) 04/16/2014  . Headache 04/16/2014  . Tension headache 03/06/2014  . Obesity, unspecified 08/13/2013  . 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 ICH (intracerebral hemorrhage) ([place]) 07/21/2013  . Hypokalemia 07/21/2013  . Intracerebral hemorrhage ([place]) 07/06/2013  . Essential hypertension 02/24/2009    Jamey Reas PT, DPT 02/22/2018, 5:59 AM  [facility] [address] [place], [place], [address] Phone: [phone]   Fax:  [phone]  Name: CRYSTALEE VENTRESS MRN: [id number] Date of Birth: 10-05-1961

## 2018-02-26 ENCOUNTER — Ambulatory Visit: Payer: Medicaid Other

## 2018-02-27 ENCOUNTER — Ambulatory Visit: Payer: Medicaid Other | Attending: Internal Medicine

## 2018-02-27 DIAGNOSIS — M6281 Muscle weakness (generalized): Secondary | ICD-10-CM

## 2018-02-27 DIAGNOSIS — M25551 Pain in right hip: Secondary | ICD-10-CM | POA: Diagnosis present

## 2018-02-27 DIAGNOSIS — M25651 Stiffness of right hip, not elsewhere classified: Secondary | ICD-10-CM | POA: Insufficient documentation

## 2018-02-27 DIAGNOSIS — R2689 Other abnormalities of gait and mobility: Secondary | ICD-10-CM | POA: Diagnosis present

## 2018-02-27 DIAGNOSIS — M79661 Pain in right lower leg: Secondary | ICD-10-CM | POA: Insufficient documentation

## 2018-02-27 DIAGNOSIS — R2681 Unsteadiness on feet: Secondary | ICD-10-CM

## 2018-02-27 NOTE — Therapy (Signed)
[facility] [address] [place], [place], [address] Phone: [phone]   Fax:  [phone]  Physical Therapy Treatment  Patient Details  Name: Alison Ward MRN: [id number] Date of Birth: April 23, 1961 Referring Provider: Angelica Chessman, MD    Encounter Date: 02/27/2018  PT End of Session - 02/27/18 0930    Visit Number  11    Number of Visits  16    Date for PT Re-Evaluation  03/08/18    Authorization Type  Medicaid     Authorization Time Period  01/30/2018 -03/12/2018 12 visits (in addition to eval plus 3 visits)    Authorization - Visit Number  10    Authorization - Number of Visits  15    PT Start Time  3710    PT Stop Time  0927    PT Time Calculation (min)  38 min    Equipment Utilized During Treatment  -- S prn    Activity Tolerance  Patient tolerated treatment well    Behavior During Therapy  [facility] for tasks assessed/performed       Past Medical History:  Diagnosis Date  . Chronic headaches    daily, bitemporal, associated with tingling in face  . Heart murmur   . Hypertension   . Seizures ([place])    partial seizures last one on 10/22/15  On Keppra currently   . Stroke [facility]) 07/05/2013    Past Surgical History:  Procedure Laterality Date  . ABDOMINAL HYSTERECTOMY N/A 12/15/2015   Procedure: HYSTERECTOMY ABDOMINAL;  Surgeon: Emily Filbert, MD;  Location: [facility];  Service: Gynecology;  Laterality: N/A;  . CESAREAN SECTION    . ceserean section    . SALPINGOOPHORECTOMY Bilateral 12/15/2015   Procedure: SALPINGO OOPHORECTOMY;  Surgeon: Emily Filbert, MD;  Location: [facility];  Service: Gynecology;  Laterality: Bilateral;  . VENTRICULOSTOMY Left 07/07/2013   Procedure: VENTRICULOSTOMY;  Surgeon: Ophelia Charter, MD;  Location: [facility];  Service: Neurosurgery;  Laterality: Left;  Left Ventriculostomy and Removal of Right Vetriculostomy    There were no vitals filed for this visit.  Subjective Assessment -  02/27/18 0851    Subjective  Pt reported she had a HA yesterday and that's why she missed her appt yesterday. Pt is getting more sleep and her leg pain is getting better, about 60% better. Pt is also going to join a gym. Pt reported she's been performing most of the new HEP, but she stopped the R gastroc/step stretch 2/2 bump on dorsum of R foot, pt had an x-ray and no fx per MD.    Pertinent History  L sided weakness, and RLE pain. 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 Falls in 2016.hysterectomy 2017    Patient Stated Goals  reduce pain and be more mobile witout pain.     Currently in Pain?  No/denies but 3/10 RLE pain while sitting in car this morning    Pain Score  0-No pain            Therex: Pt performed new HEP stretches with cues for technique and modified gastroc stretch. No pain reported. Please see pt instructions for HEP details.            [facility] Adult PT Treatment/Exercise - 02/27/18 0907      Ambulation/Gait   Ambulation/Gait  Yes    Ambulation/Gait Assistance  6: Modified independent (Device/Increase time);5: Supervision    Ambulation/Gait Assistance  Details  S during one incr. postural sway over grassy terrain. MOD I over level paved surfaces. Seated rest break 2/2 fatigue and 2/10 R hip pain after traversing grassy terrain. Pt then amb. indoors performing scanning tasks and walking at various speeds.     Ambulation Distance (Feet)  1200 Feet outdoors, 300' indoors scanning    Assistive device  None    Gait Pattern  Decreased arm swing - right;Step-through pattern;Decreased stride length;Decreased weight shift to right;Trendelenburg;Lateral hip instability;Lateral trunk lean to right;Narrow base of support;Poor foot clearance - right;Decreased stance time - right;Antalgic    Ambulation Surface  Level;Unlevel;Indoor;Outdoor;Paved;Grass    Curb  5: Supervision     Curb Details (indicate cue type and reason)  Min guard to ascend curb, cues to get close to the curb. and S to descend curb.              PT Education - 02/27/18 4098    Education provided  Yes    Education Details  PT reviewed new HEP and modified as necessary.     Person(s) Educated  Patient    Methods  Explanation;Demonstration;Verbal cues;Handout    Comprehension  Verbalized understanding;Returned demonstration       PT Short Term Goals - 02/21/18 1854      PT SHORT TERM GOAL #1   Title  Patient will increase the number of hours slept without being woke by pain by 1 hour.  In order to improve QOL.    Baseline  MET 02/21/2018    Time  30    Period  Days    Status  Achieved      PT SHORT TERM GOAL #2   Title  Pt will be Independent with updated HEP in order to continue to reduce pain thought the day and night.    Baseline  MET 02/21/2018    Time  30    Period  Days    Status  Achieved      PT SHORT TERM GOAL #3   Title  Pt will verbalize methods used to reduce pain during acute night pain flare ups to enable her to return to sleep sooner.     Baseline  MET 02/21/2018    Time  30    Period  Days    Status  Achieved        PT Long Term Goals - 01/22/18 1347      PT LONG TERM GOAL #1   Title  pt will be independent with HEP/ ongoing fitness plan ( ALL TARGET 03/08/2018)    Baseline  No long term HEP established and patient has medical condition requiring skilled PT instruction    Time  11    Period  Weeks    Status  On-going    Target Date  03/08/18      PT LONG TERM GOAL #2   Title  Patient reports 50% improvement in night pain to enable improved sleep.     Baseline  Patient reports night pain 10/10 waking her after 3-4 hours of sleep and stays awake for 1-3 hours.     Time  11    Period  Weeks    Status  On-going    Target Date  03/08/18      PT LONG TERM GOAL #3   Title  Pt ambulates 1000' outdoors on grass, ramps, curbs, and paved ground without device  modified independent to enable community mobility.     Baseline  Patient ambulates  120' at 3.07 ft/sec with abnormal gait pattern & reports pain up to 8-10/10 when ambulating in community.     Time  11    Period  Weeks    Status  On-going    Target Date  03/08/18      PT LONG TERM GOAL #4   Title  Patient reports pain improvement by 50% with standing and positions during awake hours to enable ability to perform ADLs with less pain limiting her function.     Baseline  Patient reports pain 8-10/10 with standing ADLS limiting to 3-5 minutes.     Time  11    Period  Weeks    Status  On-going    Target Date  03/08/18      PT LONG TERM GOAL #5   Title  Patient verbalizes methods to reduce pain to improve her quality of life.     Baseline  Patient is unknowledgeable in pain reduction techniques and has high levels of pain limiting daily functions.     Time  11    Period  Weeks    Status  On-going    Target Date  03/08/18      PT LONG TERM GOAL #6   Title  Patient verbalizes increased 50% confindence in balance without fear of falling to improve activities.     Baseline  Patient reports high fear of falling with activities & reluctance to leave her home due to fear of falling.     Time  11    Period  Weeks    Status  On-going    Target Date  03/08/18      PT LONG TERM GOAL #7   Title  Pt will improve FGA score to 27/30 to decr. falls risk.     Baseline  19/30    Status  On-going    Target Date  03/08/18            Plan - 02/27/18 0931    Clinical Impression Statement  Pt demonstrated progress, as she did not have pain at rest. Pt also able to amb. longer distances over uneven terrain and perform scanning tasks without overt LOB. Pt did require seated rest breaks after amb. 2/2 2/10 R hip pain. Continue with POC.     Rehab Potential  Good    Clinical Impairments Affecting Rehab Potential  Positive additude, pain levels    PT Treatment/Interventions  Therapeutic  activities;Therapeutic exercise;Balance training;Neuromuscular re-education;Patient/family education;Functional mobility training;ADLs/Self Care Home Management;Passive range of motion;Energy conservation;Gait training;DME Instruction;Stair training;Manual techniques;Moist Heat;Ultrasound    PT Next Visit Plan  Begin to assess goals.     Consulted and Agree with Plan of Care  Patient       Patient will benefit from skilled therapeutic intervention in order to improve the following deficits and impairments:  Abnormal gait, Decreased range of motion, Difficulty walking, Dizziness, Decreased activity tolerance, Decreased balance, Decreased strength, Decreased mobility, Pain, Decreased endurance, Impaired flexibility, Postural dysfunction, Obesity  Visit Diagnosis: Other abnormalities of gait and mobility  Unsteadiness on feet  Muscle weakness (generalized)  Pain in right hip     Problem List Patient Active Problem List   Diagnosis Date Noted  . Ganglion cyst of right foot 07/04/2017  . Chronic tension-type headache, not intractable 12/30/2015  . Post-operative state 12/15/2015  . Simple partial seizure disorder ([place]) 11/04/2015  . Left sided numbness 09/25/2015  . Acute kidney injury ([place]) 09/25/2015  . Leukopenia 09/25/2015  . Tension vascular headache  09/07/2015  . Meralgia paresthetica of left side 09/07/2015  . Fainting spell 09/07/2015  . Prediabetes 09/02/2015  . Stye 08/06/2014  . Essential hypertension, benign 04/16/2014  . CVA (cerebral vascular accident) ([place]) 04/16/2014  . Headache 04/16/2014  . Tension headache 03/06/2014  . Obesity, unspecified 08/13/2013  . 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[place]) 07/22/2013  . ICH (intracerebral hemorrhage) ([place]) 07/21/2013  . Hypokalemia 07/21/2013  . Intracerebral hemorrhage ([place]) 07/06/2013  . Essential hypertension 02/24/2009    Miller,Jennifer L 02/27/2018, 9:32 AM  [facility] [address] [place], [place], [address] Phone: [phone]   Fax:  [phone]  Name: Alison Ward MRN: [id number] Date of Birth: 02-14-1961  Geoffry Paradise, PT,DPT 02/27/18 9:33 AM Phone: [phone] Fax: [phone]

## 2018-02-27 NOTE — Patient Instructions (Addendum)
ANKLE: Dorsiflexion - Sitting    Sitting, place towel/sheet around ball of foot. Pull foot toward body, keeping heel on floor. Keep foot straight. Hold _30__ seconds. __3_ reps per set, _2-3__ sets per day, _7__ days per week  Copyright  VHI. All rights reserved.   Rotation: stretch (Sitting)    Place stool to side of leg. Place foot on stool. You can move foot out to side to increase stretch. Gently roll knee inward until feel a stretch. Hold 3 deep breaths. Do 3-5 reps. Repeat on other leg.   Copyright  VHI. All rights reserved.     Gastroc Stretch - Standing    Stand with uninvolved leg straight, heel on floor, toes pointed slightly inward. Lean into wall until stretch is felt in calf. Hold for _2-3 deep breaths. Repeat on other leg. Repeat _3-5__ times on each leg.   Copyright  VHI. All rights reserved.    ANKLE: Dorsiflexion, Step Unilateral    Stand on step, hang one heel off back of step. Hold __Hold for _2-3 deep breaths. Repeat on other leg. Repeat _3-5__ times on each leg.   Copyright  VHI. All rights reserved.   Iliotibial Band Stretch    Stand with right hip __6__ inches from wall. Cross other leg in front and use it and arm for support. Lean toward wall until a stretch is felt on outside of hip near wall. Keep that leg straight. Hold _Hold for _2-3 deep breaths. Repeat on other leg. Repeat _3-5__ times on each leg.  http://[email or web address].exer.us/355   Copyright  VHI. All rights reserved.

## 2018-02-28 ENCOUNTER — Ambulatory Visit: Payer: Medicaid Other

## 2018-02-28 ENCOUNTER — Other Ambulatory Visit: Payer: Self-pay | Admitting: Internal Medicine

## 2018-02-28 DIAGNOSIS — Z1231 Encounter for screening mammogram for malignant neoplasm of breast: Secondary | ICD-10-CM

## 2018-03-05 ENCOUNTER — Ambulatory Visit: Payer: Medicaid Other | Admitting: Physical Therapy

## 2018-03-05 ENCOUNTER — Encounter: Payer: Self-pay | Admitting: Physical Therapy

## 2018-03-05 DIAGNOSIS — M25551 Pain in right hip: Secondary | ICD-10-CM

## 2018-03-05 DIAGNOSIS — R2681 Unsteadiness on feet: Secondary | ICD-10-CM

## 2018-03-05 DIAGNOSIS — M6281 Muscle weakness (generalized): Secondary | ICD-10-CM

## 2018-03-05 DIAGNOSIS — R2689 Other abnormalities of gait and mobility: Secondary | ICD-10-CM

## 2018-03-05 NOTE — Therapy (Signed)
[facility] [address] [place], [place], [address] Phone: [phone]   Fax:  [phone]  Physical Therapy Treatment  Patient Details  Name: Alison Ward MRN: [id number] Date of Birth: 02-08-1961 Referring Provider: Angelica Chessman, MD    Encounter Date: 03/05/2018  PT End of Session - 03/05/18 1044    Visit Number  12    Number of Visits  16    Date for PT Re-Evaluation  03/08/18    Authorization Type  Medicaid     Authorization Time Period  01/30/2018 -03/12/2018 12 visits (in addition to eval plus 3 visits)    Authorization - Visit Number  11    Authorization - Number of Visits  15    PT Start Time  [phone]    PT Stop Time  1020    PT Time Calculation (min)  38 min    Equipment Utilized During Treatment  -- S prn    Activity Tolerance  Patient tolerated treatment well    Behavior During Therapy  [facility] for tasks assessed/performed       Past Medical History:  Diagnosis Date  . Chronic headaches    daily, bitemporal, associated with tingling in face  . Heart murmur   . Hypertension   . Seizures ([place])    partial seizures last one on 10/22/15  On Keppra currently   . Stroke [facility]) 07/05/2013    Past Surgical History:  Procedure Laterality Date  . ABDOMINAL HYSTERECTOMY N/A 12/15/2015   Procedure: HYSTERECTOMY ABDOMINAL;  Surgeon: Emily Filbert, MD;  Location: [facility];  Service: Gynecology;  Laterality: N/A;  . CESAREAN SECTION    . ceserean section    . SALPINGOOPHORECTOMY Bilateral 12/15/2015   Procedure: SALPINGO OOPHORECTOMY;  Surgeon: Emily Filbert, MD;  Location: [facility];  Service: Gynecology;  Laterality: Bilateral;  . VENTRICULOSTOMY Left 07/07/2013   Procedure: VENTRICULOSTOMY;  Surgeon: Ophelia Charter, MD;  Location: [facility];  Service: Neurosurgery;  Laterality: Left;  Left Ventriculostomy and Removal of Right Vetriculostomy    There were no vitals filed for this visit.  Subjective Assessment -  03/05/18 0945    Subjective  She is sleeping well for 6 hrs with getting up once for toileting not pain.  She also reports standing longer & less pain.     Pertinent History  L sided weakness, and RLE pain. 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 Falls in 2016.hysterectomy 2017    Limitations  Walking;Standing;Sitting;House hold activities    Patient Stated Goals  reduce pain and be more mobile witout pain.     Currently in Pain?  No/denies         [facility] PT Assessment - 03/05/18 1000      Functional Gait  Assessment   Gait assessed   Yes    Gait Level Surface  Walks 20 ft in less than 5.5 sec, no assistive devices, good speed, no evidence for imbalance, normal gait pattern, deviates no more than 6 in outside of the 12 in walkway width.    Change in Gait Speed  Able to smoothly change walking speed without loss of balance or gait deviation. Deviate no more than 6 in outside of the 12 in walkway width.    Gait with Horizontal Head Turns  Performs head turns smoothly with no change in gait. Deviates no more than 6 in outside 12 in walkway width    Gait  with Vertical Head Turns  Performs head turns with no change in gait. Deviates no more than 6 in outside 12 in walkway width.    Gait and Pivot Turn  Pivot turns safely within 3 sec and stops quickly with no loss of balance.    Step Over Obstacle  Is able to step over one shoe box (4.5 in total height) without changing gait speed. No evidence of imbalance.    Gait with Narrow Base of Support  Is able to ambulate for 10 steps heel to toe with no staggering.    Gait with Eyes Closed  Walks 20 ft, no assistive devices, good speed, no evidence of imbalance, normal gait pattern, deviates no more than 6 in outside 12 in walkway width. Ambulates 20 ft in less than 7 sec.    Ambulating Backwards  Walks 20 ft, uses assistive device, slower speed, mild gait  deviations, deviates 6-10 in outside 12 in walkway width.    Steps  Alternating feet, no rail.    Total Score  28                        Self-care: See LTGs and discussion on pain management, ADLs, etc Therapeutic Exercise: See pt education   PT Education - 03/05/18 1010    Education provided  Yes    Education Details  ongoing HEP 3-5 days/wk and community fitness 2-3 days/wk. Weight machines with low weight, posture, control, 4-6 UE & 4-6 LE machines balancing muscle groups.  Standing position changes to relief back.     Person(s) Educated  Patient    Methods  Explanation;Demonstration;Verbal cues    Comprehension  Verbalized understanding       PT Short Term Goals - 03/05/18 4098      PT SHORT TERM GOAL #1   Title  Patient will increase the number of hours slept without being woke by pain by 1 hour.  In order to improve QOL.    Baseline  MET 02/21/2018    Time  30    Period  Days    Status  Achieved      PT SHORT TERM GOAL #2   Title  Pt will be Independent with updated HEP in order to continue to reduce pain thought the day and night.    Baseline  MET 02/21/2018    Time  30    Period  Days    Status  Achieved      PT SHORT TERM GOAL #3   Title  Pt will verbalize methods used to reduce pain during acute night pain flare ups to enable her to return to sleep sooner.     Baseline  MET 02/21/2018    Time  30    Period  Days    Status  Achieved        PT Long Term Goals - 03/05/18 0953      PT LONG TERM GOAL #1   Title  pt will be independent with HEP/ ongoing fitness plan ( ALL TARGET 03/08/2018)    Baseline  MET 03/05/2018    Time  11    Period  Weeks    Status  Achieved      PT LONG TERM GOAL #2   Title  Patient reports 50% improvement in night pain to enable improved sleep.     Baseline  MET 03/05/2018 Patient reports 80% inmprovement and sleeping >6hrs with only getting up to toilet.  Time  11    Period  Weeks    Status  Achieved      PT LONG  TERM GOAL #3   Title  Pt ambulates 1000' outdoors on grass, ramps, curbs, and paved ground without device modified independent to enable community mobility.     Time  11    Period  Weeks    Status  On-going    Target Date  03/08/18      PT LONG TERM GOAL #4   Title  Patient reports pain improvement by 50% with standing and positions during awake hours to enable ability to perform ADLs with less pain limiting her function.     Baseline  MET 03/05/2018 Patient reports >70% improvement    Time  11    Period  Weeks    Status  Achieved      PT LONG TERM GOAL #5   Title  Patient verbalizes methods to reduce pain to improve her quality of life.     Baseline  MET 03/05/2018    Time  11    Period  Weeks    Status  Achieved      PT LONG TERM GOAL #6   Title  Patient verbalizes increased 50% confindence in balance without fear of falling to improve activities.     Baseline  MET 03/05/2018 Pt reports >70% improvement in confidence of balance. She has no issues going in community now.    Time  11    Period  Weeks    Status  Achieved      PT LONG TERM GOAL #7   Title  Pt will improve FGA score to 27/30 to decr. falls risk.     Baseline  MET 03/05/2018  FGA 28/30    Status  Achieved            Plan - 03/05/18 1042    Clinical Impression Statement  Patient met all 6 LTGs checked today. She has improved her function at community level with increased confidence in her movement and less pain. She is sleeping better with less pain also. Patient verbalizes PT recommendations for ongoing fitness.     Rehab Potential  Good    Clinical Impairments Affecting Rehab Potential  Positive additude, pain levels    PT Treatment/Interventions  Therapeutic activities;Therapeutic exercise;Balance training;Neuromuscular re-education;Patient/family education;Functional mobility training;ADLs/Self Care Home Management;Passive range of motion;Energy conservation;Gait training;DME Instruction;Stair training;Manual  techniques;Moist Heat;Ultrasound    PT Next Visit Plan  assess remaining LTG and discharge    Consulted and Agree with Plan of Care  Patient       Patient will benefit from skilled therapeutic intervention in order to improve the following deficits and impairments:  Abnormal gait, Decreased range of motion, Difficulty walking, Dizziness, Decreased activity tolerance, Decreased balance, Decreased strength, Decreased mobility, Pain, Decreased endurance, Impaired flexibility, Postural dysfunction, Obesity  Visit Diagnosis: Other abnormalities of gait and mobility  Unsteadiness on feet  Muscle weakness (generalized)  Pain in right hip     Problem List Patient Active Problem List   Diagnosis Date Noted  . Ganglion cyst of right foot 07/04/2017  . Chronic tension-type headache, not intractable 12/30/2015  . Post-operative state 12/15/2015  . Simple partial seizure disorder ([place]) 11/04/2015  . Left sided numbness 09/25/2015  . Acute kidney injury ([place]) 09/25/2015  . Leukopenia 09/25/2015  . Tension vascular headache 09/07/2015  . Meralgia paresthetica of left side 09/07/2015  . Fainting spell 09/07/2015  . Prediabetes 09/02/2015  . Stye  08/06/2014  . Essential hypertension, benign 04/16/2014  . CVA (cerebral vascular accident) ([place]) 04/16/2014  . Headache 04/16/2014  . Tension headache 03/06/2014  . Obesity, unspecified 08/13/2013  . 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 ICH (intracerebral hemorrhage) ([place]) 07/21/2013  . Hypokalemia 07/21/2013  . Intracerebral hemorrhage ([place]) 07/06/2013  . Essential hypertension 02/24/2009    Jamey Reas PT, DPT 03/05/2018, 10:48 AM  [facility] [address] [place], [place], [address] Phone: [phone]   Fax:  [phone]  Name: Alison Ward MRN: [id number] Date of Birth: 08/24/1961

## 2018-03-06 MED FILL — LISINOPRIL-HCTZ 20-25 MG TA: 20-25 | 30 days supply | Qty: 30 | Fill #3

## 2018-03-07 ENCOUNTER — Ambulatory Visit: Payer: Medicaid Other | Admitting: Physical Therapy

## 2018-03-07 ENCOUNTER — Encounter: Payer: Self-pay | Admitting: Physical Therapy

## 2018-03-07 DIAGNOSIS — M25651 Stiffness of right hip, not elsewhere classified: Secondary | ICD-10-CM

## 2018-03-07 DIAGNOSIS — M79661 Pain in right lower leg: Secondary | ICD-10-CM

## 2018-03-07 DIAGNOSIS — M25551 Pain in right hip: Secondary | ICD-10-CM

## 2018-03-07 DIAGNOSIS — R2689 Other abnormalities of gait and mobility: Secondary | ICD-10-CM | POA: Diagnosis not present

## 2018-03-07 DIAGNOSIS — M6281 Muscle weakness (generalized): Secondary | ICD-10-CM

## 2018-03-07 DIAGNOSIS — R2681 Unsteadiness on feet: Secondary | ICD-10-CM

## 2018-03-07 NOTE — Therapy (Signed)
[facility] [address] [place], [place], [address] Phone: [phone]   Fax:  [phone]  Physical Therapy Treatment  Patient Details  Name: Alison Ward MRN: [id number] Date of Birth: 1960-11-22 Referring Provider: Angelica Chessman, MD    Encounter Date: 03/07/2018  PT End of Session - 03/07/18 1020    Visit Number  13    Number of Visits  16    Date for PT Re-Evaluation  03/08/18    Authorization Type  Medicaid     Authorization Time Period  01/30/2018 -03/12/2018 12 visits (in addition to eval plus 3 visits)    Authorization - Visit Number  12    Authorization - Number of Visits  15    PT Start Time  0941    PT Stop Time  1007    PT Time Calculation (min)  26 min    Equipment Utilized During Treatment  -- S prn    Activity Tolerance  Patient tolerated treatment well    Behavior During Therapy  [facility] for tasks assessed/performed       Past Medical History:  Diagnosis Date  . Chronic headaches    daily, bitemporal, associated with tingling in face  . Heart murmur   . Hypertension   . Seizures ([place])    partial seizures last one on 10/22/15  On Keppra currently   . Stroke [facility]) 07/05/2013    Past Surgical History:  Procedure Laterality Date  . ABDOMINAL HYSTERECTOMY N/A 12/15/2015   Procedure: HYSTERECTOMY ABDOMINAL;  Surgeon: Emily Filbert, MD;  Location: [facility];  Service: Gynecology;  Laterality: N/A;  . CESAREAN SECTION    . ceserean section    . SALPINGOOPHORECTOMY Bilateral 12/15/2015   Procedure: SALPINGO OOPHORECTOMY;  Surgeon: Emily Filbert, MD;  Location: [facility];  Service: Gynecology;  Laterality: Bilateral;  . VENTRICULOSTOMY Left 07/07/2013   Procedure: VENTRICULOSTOMY;  Surgeon: Ophelia Charter, MD;  Location: [facility];  Service: Neurosurgery;  Laterality: Left;  Left Ventriculostomy and Removal of Right Vetriculostomy    There were no vitals filed for this visit.  Subjective Assessment -  03/07/18 0942    Subjective  She feels she made significant progress with PT. Her pain is under control now.     Pertinent History  L sided weakness, and RLE pain. 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 Falls in 2016.hysterectomy 2017    Limitations  Walking;Standing;Sitting;House hold activities    Patient Stated Goals  reduce pain and be more mobile witout pain.     Currently in Pain?  No/denies                       [facility] Adult PT Treatment/Exercise - 03/07/18 0945      Ambulation/Gait   Ambulation/Gait  Yes    Ambulation/Gait Assistance  7: Independent    Ambulation Distance (Feet)  1200 Feet    Assistive device  None    Gait Pattern  Within Functional Limits    Ambulation Surface  Indoor;Level;Outdoor;Paved;Unlevel;Gravel;Grass    Gait velocity  3.67f/s    Gait velocity - backwards  3.67 ft/sec    Stairs  Yes    Stairs Assistance  6: Modified independent (Device/Increase time)    Stair Management Technique  One rail Right;Alternating pattern;Forwards    Number of Stairs  4    Ramp  6: Modified independent (Device) no device  Curb  6: Modified independent (Device/increase time) no device             PT Education - 03/07/18 1000    Education provided  Yes    Education Details  ongoing HEP & community fitness. Increase at gradual rate. If get out of exercise routine, start back at lower level & build back up. Walking program walking out 5 min & back 5 min; increase to 30 min walks 3-5 x/wk.      Person(s) Educated  Patient    Methods  Explanation    Comprehension  Verbalized understanding       PT Short Term Goals - 03/05/18 0952      PT SHORT TERM GOAL #1   Title  Patient will increase the number of hours slept without being woke by pain by 1 hour.  In order to improve QOL.    Baseline  MET 02/21/2018    Time  30    Period  Days    Status   Achieved      PT SHORT TERM GOAL #2   Title  Pt will be Independent with updated HEP in order to continue to reduce pain thought the day and night.    Baseline  MET 02/21/2018    Time  30    Period  Days    Status  Achieved      PT SHORT TERM GOAL #3   Title  Pt will verbalize methods used to reduce pain during acute night pain flare ups to enable her to return to sleep sooner.     Baseline  MET 02/21/2018    Time  30    Period  Days    Status  Achieved        PT Long Term Goals - 03/07/18 1021      PT LONG TERM GOAL #1   Title  pt will be independent with HEP/ ongoing fitness plan ( ALL TARGET 03/08/2018)    Baseline  MET 03/05/2018    Time  11    Period  Weeks    Status  Achieved      PT LONG TERM GOAL #2   Title  Patient reports 50% improvement in night pain to enable improved sleep.     Baseline  MET 03/05/2018 Patient reports 80% inmprovement and sleeping >6hrs with only getting up to toilet.     Time  11    Period  Weeks    Status  Achieved      PT LONG TERM GOAL #3   Title  Pt ambulates 1000' outdoors on grass, ramps, curbs, and paved ground without device modified independent to enable community mobility.     Baseline  MET 03/07/2018    Time  11    Period  Weeks    Status  Achieved      PT LONG TERM GOAL #4   Title  Patient reports pain improvement by 50% with standing and positions during awake hours to enable ability to perform ADLs with less pain limiting her function.     Baseline  MET 03/05/2018 Patient reports >70% improvement    Time  11    Period  Weeks    Status  Achieved      PT LONG TERM GOAL #5   Title  Patient verbalizes methods to reduce pain to improve her quality of life.     Baseline  MET 03/05/2018    Time  11    Period  Weeks    Status  Achieved      PT LONG TERM GOAL #6   Title  Patient verbalizes increased 50% confindence in balance without fear of falling to improve activities.     Baseline  MET 03/05/2018 Pt reports >70% improvement in  confidence of balance. She has no issues going in community now.    Time  11    Period  Weeks    Status  Achieved      PT LONG TERM GOAL #7   Title  Pt will improve FGA score to 27/30 to decr. falls risk.     Baseline  MET 03/05/2018  FGA 28/30    Status  Achieved            Plan - 03/07/18 1015    Clinical Impression Statement  Patient met all 7 LTGs and appears ready for discharge today. She verbalizes understanding of benefits of ongoing HEP and reports plans to continue along with recommendation for gradual increase.     Rehab Potential  Good    Clinical Impairments Affecting Rehab Potential  Positive additude, pain levels    PT Treatment/Interventions  Therapeutic activities;Therapeutic exercise;Balance training;Neuromuscular re-education;Patient/family education;Functional mobility training;ADLs/Self Care Home Management;Passive range of motion;Energy conservation;Gait training;DME Instruction;Stair training;Manual techniques;Moist Heat;Ultrasound    PT Next Visit Plan  discharge    Consulted and Agree with Plan of Care  Patient       Patient will benefit from skilled therapeutic intervention in order to improve the following deficits and impairments:  Abnormal gait, Decreased range of motion, Difficulty walking, Dizziness, Decreased activity tolerance, Decreased balance, Decreased strength, Decreased mobility, Pain, Decreased endurance, Impaired flexibility, Postural dysfunction, Obesity  Visit Diagnosis: Other abnormalities of gait and mobility  Unsteadiness on feet  Muscle weakness (generalized)  Pain in right hip  Stiffness of right hip, not elsewhere classified  Pain in right lower leg     Problem List Patient Active Problem List   Diagnosis Date Noted  . Ganglion cyst of right foot 07/04/2017  . Chronic tension-type headache, not intractable 12/30/2015  . Post-operative state 12/15/2015  . Simple partial seizure disorder ([place]) 11/04/2015  . Left sided  numbness 09/25/2015  . Acute kidney injury ([place]) 09/25/2015  . Leukopenia 09/25/2015  . Tension vascular headache 09/07/2015  . Meralgia paresthetica of left side 09/07/2015  . Fainting spell 09/07/2015  . Prediabetes 09/02/2015  . Stye 08/06/2014  . Essential hypertension, benign 04/16/2014  . CVA (cerebral vascular accident) ([place]) 04/16/2014  . Headache 04/16/2014  . Tension headache 03/06/2014  . Obesity, unspecified 08/13/2013  . 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[place]) 07/22/2013  . ICH (intracerebral hemorrhage) ([place]) 07/21/2013  . Hypokalemia 07/21/2013  . Intracerebral hemorrhage ([place]) 07/06/2013  . Essential hypertension 02/24/2009    PHYSICAL THERAPY DISCHARGE SUMMARY  Visits from Start of Care: 13  Current functional level related to goals / functional outcomes: PT Long Term Goals - 03/07/18 1021      PT LONG TERM GOAL #1   Title  pt will be independent with HEP/ ongoing fitness plan ( ALL TARGET 03/08/2018)    Baseline  MET 03/05/2018    Time  11    Period  Weeks    Status  Achieved      PT LONG TERM GOAL #2   Title  Patient reports 50% improvement in night pain to enable improved sleep.     Baseline  MET 03/05/2018 Patient reports 80% inmprovement and sleeping >6hrs with only getting up  to toilet.     Time  11    Period  Weeks    Status  Achieved      PT LONG TERM GOAL #3   Title  Pt ambulates 1000' outdoors on grass, ramps, curbs, and paved ground without device modified independent to enable community mobility.     Baseline  MET 03/07/2018    Time  11    Period  Weeks    Status  Achieved      PT LONG TERM GOAL #4   Title  Patient reports pain improvement by 50% with standing and positions during awake hours to enable ability to perform ADLs with less pain limiting her function.     Baseline  MET 03/05/2018 Patient reports >70% improvement    Time  11    Period  Weeks    Status  Achieved      PT LONG TERM GOAL #5   Title  Patient verbalizes methods to  reduce pain to improve her quality of life.     Baseline  MET 03/05/2018    Time  11    Period  Weeks    Status  Achieved      PT LONG TERM GOAL #6   Title  Patient verbalizes increased 50% confindence in balance without fear of falling to improve activities.     Baseline  MET 03/05/2018 Pt reports >70% improvement in confidence of balance. She has no issues going in community now.    Time  11    Period  Weeks    Status  Achieved      PT LONG TERM GOAL #7   Title  Pt will improve FGA score to 27/30 to decr. falls risk.     Baseline  MET 03/05/2018  FGA 28/30    Status  Achieved         Remaining deficits: See above   Education / Equipment: HEP, body mechanics & self-care.   Plan: Patient agrees to discharge.  Patient goals were met. Patient is being discharged due to meeting the stated rehab goals.  ?????        Tauri Ethington PT, DPT 03/07/2018, 10:28 AM  [facility] [address] [place], [place], [address] Phone: [phone]   Fax:  [phone]  Name: Alison Ward MRN: [id number] Date of Birth: 1960/11/09

## 2018-03-11 ENCOUNTER — Ambulatory Visit: Payer: Medicaid Other | Attending: Family Medicine | Admitting: Family Medicine

## 2018-03-11 ENCOUNTER — Encounter: Payer: Self-pay | Admitting: Family Medicine

## 2018-03-11 VITALS — BP 107/72 | HR 60 | Temp 97.4°F | Ht 67.0 in | Wt 252.2 lb

## 2018-03-11 DIAGNOSIS — R251 Tremor, unspecified: Secondary | ICD-10-CM | POA: Diagnosis not present

## 2018-03-11 DIAGNOSIS — Z888 Allergy status to other drugs, medicaments and biological substances status: Secondary | ICD-10-CM | POA: Diagnosis not present

## 2018-03-11 DIAGNOSIS — Z7952 Long term (current) use of systemic steroids: Secondary | ICD-10-CM | POA: Diagnosis not present

## 2018-03-11 DIAGNOSIS — M5416 Radiculopathy, lumbar region: Secondary | ICD-10-CM

## 2018-03-11 DIAGNOSIS — R51 Headache: Secondary | ICD-10-CM

## 2018-03-11 DIAGNOSIS — Z9889 Other specified postprocedural states: Secondary | ICD-10-CM | POA: Insufficient documentation

## 2018-03-11 DIAGNOSIS — R5383 Other fatigue: Secondary | ICD-10-CM | POA: Diagnosis not present

## 2018-03-11 DIAGNOSIS — I10 Essential (primary) hypertension: Secondary | ICD-10-CM

## 2018-03-11 DIAGNOSIS — Z8669 Personal history of other diseases of the nervous system and sense organs: Secondary | ICD-10-CM | POA: Diagnosis not present

## 2018-03-11 DIAGNOSIS — Z79891 Long term (current) use of opiate analgesic: Secondary | ICD-10-CM | POA: Diagnosis not present

## 2018-03-11 DIAGNOSIS — R519 Headache, unspecified: Secondary | ICD-10-CM

## 2018-03-11 DIAGNOSIS — J3489 Other specified disorders of nose and nasal sinuses: Secondary | ICD-10-CM | POA: Diagnosis not present

## 2018-03-11 DIAGNOSIS — Z8673 Personal history of transient ischemic attack (TIA), and cerebral infarction without residual deficits: Secondary | ICD-10-CM | POA: Insufficient documentation

## 2018-03-11 DIAGNOSIS — Z982 Presence of cerebrospinal fluid drainage device: Secondary | ICD-10-CM | POA: Insufficient documentation

## 2018-03-11 DIAGNOSIS — Z79899 Other long term (current) drug therapy: Secondary | ICD-10-CM | POA: Insufficient documentation

## 2018-03-11 DIAGNOSIS — Z90710 Acquired absence of both cervix and uterus: Secondary | ICD-10-CM | POA: Insufficient documentation

## 2018-03-11 DIAGNOSIS — Z7982 Long term (current) use of aspirin: Secondary | ICD-10-CM | POA: Diagnosis not present

## 2018-03-11 DIAGNOSIS — Z09 Encounter for follow-up examination after completed treatment for conditions other than malignant neoplasm: Secondary | ICD-10-CM | POA: Diagnosis present

## 2018-03-11 DIAGNOSIS — R200 Anesthesia of skin: Secondary | ICD-10-CM | POA: Diagnosis not present

## 2018-03-11 MED ORDER — CETIRIZINE HCL 10 MG PO TABS: 10.0000 mg | ORAL_TABLET | Freq: Every day | ORAL | 1 refills | Status: DC

## 2018-03-11 MED ORDER — CYCLOBENZAPRINE HCL 10 MG PO TABS
10.0000 mg | ORAL_TABLET | Freq: Every evening | ORAL | 1 refills | Status: DC | PRN
Start: 2018-03-11 — End: 2018-08-01

## 2018-03-11 MED ORDER — FLUTICASONE PROPIONATE 50 MCG/ACT NA SUSP: 2.0000 | Freq: Every day | NASAL | 1 refills | Status: DC

## 2018-03-11 MED ORDER — GABAPENTIN 300 MG PO CAPS: 600.0000 mg | ORAL_CAPSULE | Freq: Three times a day (TID) | ORAL | 0 refills | Status: DC

## 2018-03-11 MED ORDER — BUTALBITAL-APAP-CAFF-COD 50-325-40-30 MG PO CAPS: 1.0000 | ORAL_CAPSULE | Freq: Two times a day (BID) | ORAL | 1 refills | Status: DC | PRN

## 2018-03-11 MED FILL — CYCLOBENZAPRINE 10 MG TAB: 10 | 30 days supply | Qty: 30 | Fill #0

## 2018-03-11 MED FILL — CETIRIZINE HCL 10 MG TABS: 10 | 30 days supply | Qty: 30 | Fill #0

## 2018-03-11 MED FILL — GABAPENTIN 300 MG CAPSULE: 300 | 30 days supply | Qty: 180 | Fill #0

## 2018-03-11 NOTE — Patient Instructions (Signed)
Tremor A tremor is trembling or shaking that you cannot control. Most tremors affect the hands or arms. Tremors can also affect the head, vocal cords, face, and other parts of the body. There are many types of tremors. Common types include:  Essential tremor. These usually occur in people over the age of 40. It may run in families and can happen in otherwise healthy people.  Resting tremor. These occur when the muscles are at rest, such as when your hands are resting in your lap. People with Parkinson disease often have resting tremors.  Postural tremor. These occur when you try to hold a pose, such as keeping your hands outstretched.  Kinetic tremor. These occur during purposeful movement, such as trying to touch a finger to your nose.  Task-specific tremor. These may occur when you perform tasks such as handwriting, speaking, or standing.  Psychogenic tremor. These dramatically lessen or disappear when you are distracted. They can happen in people of all ages.  Some types of tremors have no known cause. Tremors can also be a symptom of nervous system problems (neurological disorders) that may occur with aging. Some tremors go away with treatment while others do not. Follow these instructions at home: Watch your tremor for any changes. The following actions may help to lessen any discomfort you are feeling:  Take medicines only as directed by your health care provider.  Limit alcohol intake to no more than 1 drink per day for nonpregnant women and 2 drinks per day for men. One drink equals 12 oz of beer, 5 oz of wine, or 1 oz of hard liquor.  Do not use any tobacco products, including cigarettes, chewing tobacco, or electronic cigarettes. If you need help quitting, ask your health care provider.  Avoid extreme heat or cold.  Limit the amount of caffeine you consumeas directed by your health care provider.  Try to get 8 hours of sleep each night.  Find ways to manage your stress,  such as meditation or yoga.  Keep all follow-up visits as directed by your health care provider. This is important.  Contact a health care provider if:  You start having a tremor after starting a new medicine.  You have tremor with other symptoms such as: ? Numbness. ? Tingling. ? Pain. ? Weakness.  Your tremor gets worse.  Your tremor interferes with your day-to-day life. This information is not intended to replace advice given to you by your health care provider. Make sure you discuss any questions you have with your health care provider. Document Released: 10/06/2002 Document Revised: 06/18/2016 Document Reviewed: 04/13/2014 Elsevier Interactive Patient Education  2018 [employer]  

## 2018-03-11 NOTE — Progress Notes (Signed)
Patient is still having numbness on left side.

## 2018-03-11 NOTE — Progress Notes (Signed)
Subjective:  Patient ID: Alison Ward, female    DOB: 07-04-1961  Age: 57 y.o. MRN: [id number]  CC: Establish Care; Headache; and Hypertension   HPI Alison Ward is a 57 y.o. female right-handed female with a medical history of hypertension, chronic headaches, previous history of right  Hemorrhagic stroke status post ventriculostomy in 06/2013 who comes in here for follow-up visit. She complains of numbness on the left side of her body and has been on gabapentin but symptoms are more pronounced when she is fatigued at the end of the day and she endorses having to care for her sick mother who underwent cardiac bypass surgery and peripheral arterial surgery in her legs.  Gabapentin makes her sleepy and so she takes a 300 mg tablet in the morning but 600 mg in the afternoon and evening. She has also noticed over the last 1 month she has had frontal headaches with sensitivity of her forehead and she is becoming more aware of her incisions from her previous ventriculostomy.  States she has had episodes " like a flash" she felt she would lose consciousness but then felt normal shortly afterwards.  She denies syncope. She has not been to see her neurologist, Dr. Leonie Man in a while. Denies seizures and has been compliant with her Keppra.  She does have right lumbar radiculopathy and has completed her physical therapy sessions and reports significant improvement in her symptoms. Doing well on her antihypertensive and denies adverse effects of her medications.  Past Medical History:  Diagnosis Date  . Chronic headaches    daily, bitemporal, associated with tingling in face  . Heart murmur   . Hypertension   . 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 Stroke [facility]) 07/05/2013    Past Surgical History:  Procedure Laterality Date  . ABDOMINAL HYSTERECTOMY N/A 12/15/2015   Procedure: HYSTERECTOMY ABDOMINAL;  Surgeon: Emily Filbert, MD;  Location: [facility];   Service: Gynecology;  Laterality: N/A;  . CESAREAN SECTION    . ceserean section    . SALPINGOOPHORECTOMY Bilateral 12/15/2015   Procedure: SALPINGO OOPHORECTOMY;  Surgeon: Emily Filbert, MD;  Location: [facility];  Service: Gynecology;  Laterality: Bilateral;  . VENTRICULOSTOMY Left 07/07/2013   Procedure: VENTRICULOSTOMY;  Surgeon: Ophelia Charter, MD;  Location: [facility];  Service: Neurosurgery;  Laterality: Left;  Left Ventriculostomy and Removal of Right Vetriculostomy    Allergies  Allergen Reactions  . Elavil [Amitriptyline] Other (See Comments)    DIZZINESS  . Topiramate Er Other (See Comments)    Suicidal Thoughts- Trokendi     Outpatient Medications Prior to Visit  Medication Sig Dispense Refill  . aspirin EC 81 MG tablet Take 1 tablet (81 mg total) by mouth daily. 30 tablet 1  . atorvastatin (LIPITOR) 40 MG tablet Take 1 tablet (40 mg total) by mouth daily. 90 tablet 3  . levETIRAcetam (KEPPRA XR) 500 MG 24 hr tablet Take 1 tablet (500 mg total) by mouth daily. 90 tablet 3  . lisinopril-hydrochlorothiazide (PRINZIDE,ZESTORETIC) 20-25 MG tablet Take 1 tablet by mouth daily. 90 tablet 3  . fluticasone (FLONASE) 50 MCG/ACT nasal spray Place 2 sprays into both nostrils daily. 16 g 1  . gabapentin (NEURONTIN) 300 MG capsule Take 2 capsules (600 mg total) by mouth 3 (three) times daily. 540 capsule 0  . acetaminophen-codeine (TYLENOL #3) 300-30 MG tablet Take 1 tablet by mouth every 4 (four) hours as needed. (  Patient not taking: Reported on 03/11/2018) 60 tablet 0  . predniSONE (STERAPRED UNI-PAK 21 TAB) 10 MG (21) TBPK tablet Take as directed (Patient not taking: Reported on 03/11/2018) 21 tablet 0   No facility-administered medications prior to visit.     ROS Review of Systems  Constitutional: Negative for activity change, appetite change and fatigue.  HENT: Negative for congestion, sinus pressure and sore throat.   Eyes: Negative for visual disturbance.  Respiratory: Negative  for cough, chest tightness, shortness of breath and wheezing.   Cardiovascular: Negative for chest pain and palpitations.  Gastrointestinal: Negative for abdominal distention, abdominal pain and constipation.  Endocrine: Negative for polydipsia.  Genitourinary: Negative for dysuria and frequency.  Musculoskeletal: Negative for arthralgias and back pain.  Skin: Negative for rash.  Neurological: Positive for numbness and headaches. Negative for tremors and light-headedness.  Hematological: Does not bruise/bleed easily.  Psychiatric/Behavioral: Negative for agitation and behavioral problems.    Objective:  BP 107/72   Pulse 60   Temp (!) 97.4 F (36.3 C) (Oral)   Ht 5' 7"  (1.702 m)   Wt 252 lb 3.2 oz (114.4 kg)   LMP 11/24/2011   SpO2 99%   BMI 39.50 kg/m   BP/Weight 03/11/2018 02/12/2018 0/62/6948  Systolic BP 546 270 350  Diastolic BP 72 81 72  Wt. (Lbs) 252.2 - -  BMI 39.5 - -      Physical Exam  Constitutional: She is oriented to person, place, and time. She appears well-developed and well-nourished.  Cardiovascular: Normal rate, normal heart sounds and intact distal pulses.  No murmur heard. Pulmonary/Chest: Effort normal and breath sounds normal. She has no wheezes. She has no rales. She exhibits no tenderness.  Abdominal: Soft. Bowel sounds are normal. She exhibits no distension and no mass. There is no tenderness.  Musculoskeletal: Normal range of motion.  Neurological: She is alert and oriented to person, place, and time. She displays normal reflexes. A sensory deficit (dysesthesia of left upper extremity) is present. Coordination and gait normal.  Psychiatric: She has a normal mood and affect.     CMP Latest Ref Rng & Units 12/06/2016 12/08/2015 10/15/2015  Glucose 65 - 99 mg/dL 84 92 143(H)  BUN 7 - 25 mg/dL 13 19 14   Creatinine 0.50 - 1.05 mg/dL 1.02 1.03(H) 1.09(H)  Sodium 135 - 146 mmol/L 141 138 139  Potassium 3.5 - 5.3 mmol/L 4.1 3.3(L) 3.2(L)  Chloride 98 -  110 mmol/L 101 101 101  CO2 20 - 31 mmol/L 30 30 27   Calcium 8.6 - 10.4 mg/dL 9.8 9.5 9.9  Total Protein 6.1 - 8.1 g/dL 7.9 - 8.1  Total Bilirubin 0.2 - 1.2 mg/dL 0.9 - 1.2  Alkaline Phos 33 - 130 U/L 51 - 53  AST 10 - 35 U/L 18 - 23  ALT 6 - 29 U/L 9 - 15    Lipid Panel     Component Value Date/Time   CHOL 245 (H) 12/06/2016 1012   TRIG 177 (H) 12/06/2016 1012   HDL 38 (L) 12/06/2016 1012   CHOLHDL 6.4 (H) 12/06/2016 1012   VLDL 35 (H) 12/06/2016 1012   LDLCALC 172 (H) 12/06/2016 1012   .   Assessment & Plan:   1. Essential hypertension Controlled Continue lisinopril/HCTZ - Lipid panel; Future - CMP14+EGFR; Future - TSH; Future - T4, free; Future  2. Lumbar radiculopathy  completed physical therapy and reports improvement  3. Left sided numbness History of meralgia paresthetica Unable to increase dose of gabapentin  due to complains of sedation We will add muscle relaxant to regimen and she has been advised to incorporate rest time to her day has symptoms are more pronounced when she is fatigued - cyclobenzaprine (FLEXERIL) 10 MG tablet; Take 1 tablet (10 mg total) by mouth at bedtime as needed for muscle spasms.  Dispense: 30 tablet; Refill: 1 - gabapentin (NEURONTIN) 300 MG capsule; Take 2 capsules (600 mg total) by mouth 3 (three) times daily.  Dispense: 540 capsule; Refill: 0  4. Tremor Unknown etiology TSH ordered  5. Nonintractable headache, unspecified chronicity pattern, unspecified headache type Could be post stroke headache versus sinus headache Continue Zyrtec and Flonase We will place on Fioricet to use as needed - butalbital-acetaminophen-caffeine (FIORICET/CODEINE) 50-325-40-30 MG capsule; Take 1 capsule by mouth every 12 (twelve) hours as needed for headache.  Dispense: 30 capsule; Refill: 1  6. Sinus drainage - fluticasone (FLONASE) 50 MCG/ACT nasal spray; Place 2 sprays into both nostrils daily.  Dispense: 16 g; Refill: 1 - cetirizine (ZYRTEC)  10 MG tablet; Take 1 tablet (10 mg total) by mouth daily.  Dispense: 30 tablet; Refill: 1   Meds ordered this encounter  Medications  . fluticasone (FLONASE) 50 MCG/ACT nasal spray    Sig: Place 2 sprays into both nostrils daily.    Dispense:  16 g    Refill:  1  . cetirizine (ZYRTEC) 10 MG tablet    Sig: Take 1 tablet (10 mg total) by mouth daily.    Dispense:  30 tablet    Refill:  1  . cyclobenzaprine (FLEXERIL) 10 MG tablet    Sig: Take 1 tablet (10 mg total) by mouth at bedtime as needed for muscle spasms.    Dispense:  30 tablet    Refill:  1  . butalbital-acetaminophen-caffeine (FIORICET/CODEINE) 50-325-40-30 MG capsule    Sig: Take 1 capsule by mouth every 12 (twelve) hours as needed for headache.    Dispense:  30 capsule    Refill:  1  . gabapentin (NEURONTIN) 300 MG capsule    Sig: Take 2 capsules (600 mg total) by mouth 3 (three) times daily.    Dispense:  540 capsule    Refill:  0    Follow-up: Return in about 3 months (around 06/11/2018) for Follow-up of chronic medical conditions.   Charlott Rakes MD

## 2018-03-12 ENCOUNTER — Other Ambulatory Visit: Payer: Self-pay | Admitting: *Deleted

## 2018-03-12 ENCOUNTER — Ambulatory Visit: Payer: Medicaid Other | Attending: Family Medicine

## 2018-03-12 DIAGNOSIS — I10 Essential (primary) hypertension: Secondary | ICD-10-CM | POA: Diagnosis present

## 2018-03-12 DIAGNOSIS — G44229 Chronic tension-type headache, not intractable: Secondary | ICD-10-CM

## 2018-03-12 MED ORDER — LEVETIRACETAM ER 500 MG PO TB24: 500.0000 mg | ORAL_TABLET | Freq: Every day | ORAL | 2 refills | Status: DC

## 2018-03-12 NOTE — Telephone Encounter (Signed)
Pt came by the office.  PCP no longer filling pts sz meds (as she is not her pcp any longer).  We last saw pt in 11/27/17 by CM/NP and states to continue keppra.  [employer].  Filled for pt.

## 2018-03-12 NOTE — Progress Notes (Signed)
Patient here for lab visit only 

## 2018-03-13 ENCOUNTER — Other Ambulatory Visit: Payer: Self-pay | Admitting: Family Medicine

## 2018-03-13 LAB — CMP14+EGFR
A/G RATIO: 1.3 (ref 1.2–2.2)
ALK PHOS: 64 IU/L (ref 39–117)
ALT: 12 IU/L (ref 0–32)
AST: 17 IU/L (ref 0–40)
Albumin: 4 g/dL (ref 3.5–5.5)
BILIRUBIN TOTAL: 0.9 mg/dL (ref 0.0–1.2)
BUN/Creatinine Ratio: 12 (ref 9–23)
BUN: 11 mg/dL (ref 6–24)
CHLORIDE: 99 mmol/L (ref 96–106)
CO2: 29 mmol/L (ref 20–29)
Calcium: 9.9 mg/dL (ref 8.7–10.2)
Creatinine, Ser: 0.95 mg/dL (ref 0.57–1.00)
GFR calc Af Amer: 76 mL/min/{1.73_m2} (ref >59)
GFR calc non Af Amer: 66 mL/min/{1.73_m2} (ref >59)
GLUCOSE: 84 mg/dL (ref 65–99)
Globulin, Total: 3.1 g/dL (ref 1.5–4.5)
POTASSIUM: 4.1 mmol/L (ref 3.5–5.2)
Sodium: 141 mmol/L (ref 134–144)
Total Protein: 7.1 g/dL (ref 6.0–8.5)

## 2018-03-13 LAB — T4, FREE: Free T4: 1.19 ng/dL (ref 0.82–1.77)

## 2018-03-13 LAB — LIPID PANEL
CHOLESTEROL TOTAL: 129 mg/dL (ref 100–199)
Chol/HDL Ratio: 3.5 ratio (ref 0.0–4.4)
HDL: 37 mg/dL — AB (ref >39)
LDL Calculated: 62 mg/dL (ref 0–99)
TRIGLYCERIDES: 152 mg/dL — AB (ref 0–149)
VLDL CHOLESTEROL CAL: 30 mg/dL (ref 5–40)

## 2018-03-13 LAB — TSH: TSH: 1.02 u[IU]/mL (ref 0.450–4.500)

## 2018-03-22 MED FILL — ATORVASTATIN CALCIUM 40 MG: 40 | 30 days supply | Qty: 30 | Fill #8

## 2018-03-27 ENCOUNTER — Ambulatory Visit
Admission: RE | Admit: 2018-03-27 | Discharge: 2018-03-27 | Disposition: A | Discharge status: Home / Self Care | Payer: Medicaid Other | Source: Ambulatory Visit | Attending: Internal Medicine | Admitting: Internal Medicine

## 2018-03-27 DIAGNOSIS — Z1231 Encounter for screening mammogram for malignant neoplasm of breast: Secondary | ICD-10-CM

## 2018-03-29 ENCOUNTER — Ambulatory Visit: Payer: Medicaid Other | Admitting: Family Medicine

## 2018-04-03 MED FILL — LISINOPRIL-HCTZ 20-25 MG TA: 20-25 | 30 days supply | Qty: 30 | Fill #4

## 2018-04-25 MED FILL — ATORVASTATIN CALCIUM 40 MG: 40 | 30 days supply | Qty: 30 | Fill #9

## 2018-04-26 ENCOUNTER — Ambulatory Visit: Payer: Medicaid Other | Admitting: Family Medicine

## 2018-05-06 MED FILL — GABAPENTIN 300 MG CAPSULE: 300 | 30 days supply | Qty: 180 | Fill #1

## 2018-05-06 MED FILL — LISINOPRIL-HCTZ 20-25 MG TA: 20-25 | 30 days supply | Qty: 30 | Fill #5

## 2018-05-30 MED FILL — ATORVASTATIN CALCIUM 40 MG: 40 | 30 days supply | Qty: 30 | Fill #10

## 2018-06-06 MED FILL — LISINOPRIL-HCTZ 20-25 MG TA: 20-25 | 30 days supply | Qty: 30 | Fill #6

## 2018-06-06 MED FILL — GABAPENTIN 300 MG CAPSULE: 300 | 30 days supply | Qty: 180 | Fill #2

## 2018-06-12 ENCOUNTER — Ambulatory Visit: Payer: Medicaid Other | Admitting: Family Medicine

## 2018-06-27 MED FILL — ATORVASTATIN CALCIUM 40 MG: 40 | 30 days supply | Qty: 30 | Fill #11

## 2018-07-08 MED FILL — LISINOPRIL-HCTZ 20-25 MG TA: 20-25 | 30 days supply | Qty: 30 | Fill #7

## 2018-07-29 MED FILL — ATORVASTATIN 40 MG TABLET: 40 | 30 days supply | Qty: 30 | Fill #0

## 2018-08-01 ENCOUNTER — Encounter

## 2018-08-01 ENCOUNTER — Encounter: Payer: Self-pay | Admitting: Family Medicine

## 2018-08-01 ENCOUNTER — Ambulatory Visit: Payer: Medicaid Other | Attending: Family Medicine | Admitting: Family Medicine

## 2018-08-01 VITALS — BP 103/70 | HR 69 | Temp 97.7°F | Ht 67.0 in | Wt 257.4 lb

## 2018-08-01 DIAGNOSIS — Z79899 Other long term (current) drug therapy: Secondary | ICD-10-CM | POA: Insufficient documentation

## 2018-08-01 DIAGNOSIS — G44229 Chronic tension-type headache, not intractable: Secondary | ICD-10-CM | POA: Diagnosis not present

## 2018-08-01 DIAGNOSIS — Z7982 Long term (current) use of aspirin: Secondary | ICD-10-CM | POA: Insufficient documentation

## 2018-08-01 DIAGNOSIS — E785 Hyperlipidemia, unspecified: Secondary | ICD-10-CM | POA: Insufficient documentation

## 2018-08-01 DIAGNOSIS — R7303 Prediabetes: Secondary | ICD-10-CM | POA: Insufficient documentation

## 2018-08-01 DIAGNOSIS — I10 Essential (primary) hypertension: Secondary | ICD-10-CM | POA: Diagnosis not present

## 2018-08-01 DIAGNOSIS — G5712 Meralgia paresthetica, left lower limb: Secondary | ICD-10-CM

## 2018-08-01 LAB — POCT GLYCOSYLATED HEMOGLOBIN (HGB A1C): HbA1c, POC (prediabetic range): 5.8 % (ref 5.7–6.4)

## 2018-08-01 NOTE — Progress Notes (Signed)
Subjective:  Patient ID: Alison Ward, female    DOB: 30-Nov-1960  Age: 57 y.o. MRN: [id number]  CC: Hypertension   HPI Alison Ward is a 57 y.o. female right-handed female with a medical history of hypertension, chronic headaches, previous history of right  Hemorrhagic stroke status post ventriculostomy in 06/2013, prediabetes who comes in here for follow-up visit. She reports doing well and her chronic headaches have responded to gabapentin hence she no longer needs Fioricet.  She has had no recent seizures and is doing well on Keppra. Compliant with her antihypertensive and statin and denies adverse effects from her medications. She has noticed the right dorsum of her foot being more prominent over the last month and symptoms have been intermittent but without pain.  2 years ago she recalls hitting her foot against the bed and is unsure if this is related. She has no additional concerns today.  Past Medical History:  Diagnosis Date  . Chronic headaches    daily, bitemporal, associated with tingling in face  . Heart murmur   . Hypertension   . 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 Stroke [facility]) 07/05/2013    Past Surgical History:  Procedure Laterality Date  . ABDOMINAL HYSTERECTOMY N/A 12/15/2015   Procedure: HYSTERECTOMY ABDOMINAL;  Surgeon: Emily Filbert, MD;  Location: [facility];  Service: Gynecology;  Laterality: N/A;  . CESAREAN SECTION    . ceserean section    . SALPINGOOPHORECTOMY Bilateral 12/15/2015   Procedure: SALPINGO OOPHORECTOMY;  Surgeon: Emily Filbert, MD;  Location: [place] [facility];  Service: Gynecology;  Laterality: Bilateral;  . VENTRICULOSTOMY Left 07/07/2013   Procedure: VENTRICULOSTOMY;  Surgeon: Ophelia Charter, MD;  Location: [place] [facility];  Service: Neurosurgery;  Laterality: Left;  Left Ventriculostomy and Removal of Right Vetriculostomy    Allergies  Allergen Reactions  . Elavil [Amitriptyline] Other (See Comments)   DIZZINESS  . Topiramate Er Other (See Comments)    Suicidal Thoughts- Trokendi     Outpatient Medications Prior to Visit  Medication Sig Dispense Refill  . aspirin EC 81 MG tablet Take 1 tablet (81 mg total) by mouth daily. 30 tablet 1  . atorvastatin (LIPITOR) 40 MG tablet Take 1 tablet (40 mg total) by mouth daily. 90 tablet 3  . gabapentin (NEURONTIN) 300 MG capsule Take 2 capsules (600 mg total) by mouth 3 (three) times daily. 540 capsule 0  . levETIRAcetam (KEPPRA XR) 500 MG 24 hr tablet Take 1 tablet (500 mg total) by mouth daily. 90 tablet 2  . lisinopril-hydrochlorothiazide (PRINZIDE,ZESTORETIC) 20-25 MG tablet Take 1 tablet by mouth daily. 90 tablet 3  . acetaminophen-codeine (TYLENOL #3) 300-30 MG tablet Take 1 tablet by mouth every 4 (four) hours as needed. (Patient not taking: Reported on 03/11/2018) 60 tablet 0  . butalbital-acetaminophen-caffeine (FIORICET/CODEINE) 50-325-40-30 MG capsule Take 1 capsule by mouth every 12 (twelve) hours as needed for headache. (Patient not taking: Reported on 08/01/2018) 30 capsule 1  . cetirizine (ZYRTEC) 10 MG tablet Take 1 tablet (10 mg total) by mouth daily. (Patient not taking: Reported on 08/01/2018) 30 tablet 1  . cyclobenzaprine (FLEXERIL) 10 MG tablet Take 1 tablet (10 mg total) by mouth at bedtime as needed for muscle spasms. (Patient not taking: Reported on 08/01/2018) 30 tablet 1  . fluticasone (FLONASE) 50 MCG/ACT nasal spray Place 2 sprays into both nostrils daily. (Patient not taking: Reported on 08/01/2018) 16 g 1  No facility-administered medications prior to visit.     ROS Review of Systems  Constitutional: Negative for activity change, appetite change and fatigue.  HENT: Negative for congestion, sinus pressure and sore throat.   Eyes: Negative for visual disturbance.  Respiratory: Negative for cough, chest tightness, shortness of breath and wheezing.   Cardiovascular: Negative for chest pain and palpitations.    Gastrointestinal: Negative for abdominal distention, abdominal pain and constipation.  Endocrine: Negative for polydipsia.  Genitourinary: Negative for dysuria and frequency.  Musculoskeletal:       See hpi  Skin: Negative for rash.  Neurological: Negative for tremors, light-headedness and numbness.  Hematological: Does not bruise/bleed easily.  Psychiatric/Behavioral: Negative for agitation and behavioral problems.    Objective:  BP 103/70   Pulse 69   Temp 97.7 F (36.5 C) (Oral)   Ht 5\' 7"  (1.702 m)   Wt 257 lb 6.4 oz (116.8 kg)   LMP 11/24/2011   SpO2 98%   BMI 40.31 kg/m   BP/Weight 08/01/2018 03/11/2018 0/62/6948  Systolic BP 546 270 350  Diastolic BP 70 72 81  Wt. (Lbs) 257.4 252.2 -  BMI 40.31 39.5 -      Physical Exam  Constitutional: She is oriented to person, place, and time. She appears well-developed and well-nourished.  Cardiovascular: Normal rate, normal heart sounds and intact distal pulses.  No murmur heard. Pulmonary/Chest: Effort normal and breath sounds normal. She has no wheezes. She has no rales. She exhibits no tenderness.  Abdominal: Soft. Bowel sounds are normal. She exhibits no distension and no mass. There is no tenderness.  Musculoskeletal: Normal range of motion.  Prominent talus bones, right greater than left; no tenderness to palpation  Neurological: She is alert and oriented to person, place, and time.  Skin: Skin is warm and dry.  Psychiatric: She has a normal mood and affect.    CMP Latest Ref Rng & Units 03/12/2018 12/06/2016 12/08/2015  Glucose 65 - 99 mg/dL 84 84 92  BUN 6 - 24 mg/dL 11 13 19   Creatinine 0.57 - 1.00 mg/dL 0.95 1.02 1.03(H)  Sodium 134 - 144 mmol/L 141 141 138  Potassium 3.5 - 5.2 mmol/L 4.1 4.1 3.3(L)  Chloride 96 - 106 mmol/L 99 101 101  CO2 20 - 29 mmol/L 29 30 30   Calcium 8.7 - 10.2 mg/dL 9.9 9.8 9.5  Total Protein 6.0 - 8.5 g/dL 7.1 7.9 -  Total Bilirubin 0.0 - 1.2 mg/dL 0.9 0.9 -  Alkaline Phos 39 - 117 IU/L  64 51 -  AST 0 - 40 IU/L 17 18 -  ALT 0 - 32 IU/L 12 9 -    Lipid Panel     Component Value Date/Time   CHOL 129 03/12/2018 1056   TRIG 152 (H) 03/12/2018 1056   HDL 37 (L) 03/12/2018 1056   CHOLHDL 3.5 03/12/2018 1056   CHOLHDL 6.4 (H) 12/06/2016 1012   VLDL 35 (H) 12/06/2016 1012   LDLCALC 62 03/12/2018 1056    Lab Results  Component Value Date   HGBA1C 5.8 08/01/2018    Assessment & Plan:   1. Prediabetes Diet controlled with A1c of 5.8 - POCT glycosylated hemoglobin (Hb A1C)  2. Essential hypertension Controlled Continue lisinopril Counseled on blood pressure goal of less than 130/80, low-sodium, DASH diet, medication compliance, 150 minutes of moderate intensity exercise per week. Discussed medication compliance, adverse effects.  3. Meralgia paresthetica of left side Currently on gabapentin Doing well  4. Chronic tension-type headache, not intractable  Controlled on gabapentin She no longer needs Fioricet which have discontinued  5.  Hyperlipidemia Controlled Continue Lipitor Low-cholesterol diet  Bony protrusion of Talus is present in both feet and slightly prominent on the right foot.  This is physiologic and not pathologic; she has been reassured.  No orders of the defined types were placed in this encounter.   Follow-up: Return in about 6 months (around 01/31/2019) for Follow-up of chronic medical conditions.   Charlott Rakes MD

## 2018-08-01 NOTE — Progress Notes (Signed)
Patient has knot on top of right foot.

## 2018-08-01 NOTE — Patient Instructions (Signed)
Prediabetes Prediabetes is the condition of having a blood sugar (blood glucose) level that is higher than it should be, but not high enough for you to be diagnosed with type 2 diabetes. Having prediabetes puts you at risk for developing type 2 diabetes (type 2 diabetes mellitus). Prediabetes may be called impaired glucose tolerance or impaired fasting glucose. Prediabetes usually does not cause symptoms. Your health care provider can diagnose this condition with blood tests. You may be tested for prediabetes if you are overweight and if you have at least one other risk factor for prediabetes. Risk factors for prediabetes include:  Having a family member with type 2 diabetes.  Being overweight or obese.  Being older than age 57.  Being of American-Indian, African-American, Hispanic/Latino, or Asian/Pacific Islander descent.  Having an inactive (sedentary) lifestyle.  Having a history of gestational diabetes or polycystic ovarian syndrome (PCOS).  Having low levels of good cholesterol (HDL-C) or high levels of blood fats (triglycerides).  Having high blood pressure.  What is blood glucose and how is blood glucose measured?  Blood glucose refers to the amount of glucose in your bloodstream. Glucose comes from eating foods that contain sugars and starches (carbohydrates) that the body breaks down into glucose. Your blood glucose level may be measured in mg/dL (milligrams per deciliter) or mmol/L (millimoles per liter).Your blood glucose may be checked with one or more of the following blood tests:  A fasting blood glucose (FBG) test. You will not be allowed to eat (you will fast) for at least 8 hours before a blood sample is taken. ? A normal range for FBG is 70-100 mg/dl (3.9-5.6 mmol/L).  An A1c (hemoglobin A1c) blood test. This test provides information about blood glucose control over the previous 2?68month.  An oral glucose tolerance test (OGTT). This test measures your blood  glucose twice: ? After fasting. This is your baseline level. ? Two hours after you drink a beverage that contains glucose.  You may be diagnosed with prediabetes:  If your FBG is 100?125 mg/dL (5.6-6.9 mmol/L).  If your A1c level is 5.7?6.4%.  If your OGGT result is 140?199 mg/dL (7.8-11 mmol/L).  These blood tests may be repeated to confirm your diagnosis. What happens if blood glucose is too high? The pancreas produces a hormone (insulin) that helps move glucose from the bloodstream into cells. When cells in the body do not respond properly to insulin that the body makes (insulin resistance), excess glucose builds up in the blood instead of going into cells. As a result, high blood glucose (hyperglycemia) can develop, which can cause many complications. This is a symptom of prediabetes. What can happen if blood glucose stays higher than normal for a long time? Having high blood glucose for a long time is dangerous. Too much glucose in your blood can damage your nerves and blood vessels. Long-term damage can lead to complications from diabetes, which may include:  Heart disease.  Stroke.  Blindness.  Kidney disease.  Depression.  Poor circulation in the feet and legs, which could lead to surgical removal (amputation) in severe cases.  How can prediabetes be prevented from turning into type 2 diabetes?  To help prevent type 2 diabetes, take the following actions:  Be physically active. ? Do moderate-intensity physical activity for at least 30 minutes on at least 5 days of the week, or as much as told by your health care provider. This could be brisk walking, biking, or water aerobics. ? Ask your health care provider what  activities are safe for you. A mix of physical activities may be best, such as walking, swimming, cycling, and strength training.  Lose weight as told by your health care provider. ? Losing 5-7% of your body weight can reverse insulin resistance. ? Your health  care provider can determine how much weight loss is best for you and can help you lose weight safely.  Follow a healthy meal plan. This includes eating lean proteins, complex carbohydrates, fresh fruits and vegetables, low-fat dairy products, and healthy fats. ? Follow instructions from your health care provider about eating or drinking restrictions. ? Make an appointment to see a diet and nutrition specialist (registered dietitian) to help you create a healthy eating plan that is right for you.  Do not smoke or use any tobacco products, such as cigarettes, chewing tobacco, and e-cigarettes. If you need help quitting, ask your health care provider.  Take over-the-counter and prescription medicines as told by your health care provider. You may be prescribed medicines that help lower the risk of type 2 diabetes.  This information is not intended to replace advice given to you by your health care provider. Make sure you discuss any questions you have with your health care provider. Document Released: 02/07/2016 Document Revised: 03/23/2016 Document Reviewed: 12/07/2015 [employer]vier Interactive Patient Education  2018 [employer]  

## 2018-08-09 MED FILL — GABAPENTIN 300 MG CAPSULE: 300 | 30 days supply | Qty: 180 | Fill #2

## 2018-08-09 MED FILL — LISINOPRIL-HCTZ 20-25 MG TA: 20-25 | 90 days supply | Qty: 90 | Fill #8

## 2018-08-28 MED FILL — ATORVASTATIN 40 MG TABLET: 40 | 90 days supply | Qty: 90 | Fill #1

## 2018-09-19 ENCOUNTER — Other Ambulatory Visit: Payer: Self-pay | Admitting: Internal Medicine

## 2018-09-19 DIAGNOSIS — R200 Anesthesia of skin: Secondary | ICD-10-CM

## 2018-10-01 DIAGNOSIS — J189 Pneumonia, unspecified organism: Secondary | ICD-10-CM | POA: Diagnosis not present

## 2018-10-02 MED FILL — GABAPENTIN 300 MG CAPSULE: 300 | 30 days supply | Qty: 180 | Fill #0

## 2018-10-10 ENCOUNTER — Other Ambulatory Visit: Payer: Self-pay

## 2018-10-10 ENCOUNTER — Ambulatory Visit: Payer: Medicaid Other | Attending: Family Medicine | Admitting: Physician Assistant

## 2018-10-10 VITALS — BP 116/80 | HR 86 | Temp 98.1°F | Resp 20 | Wt 262.0 lb

## 2018-10-10 DIAGNOSIS — J157 Pneumonia due to Mycoplasma pneumoniae: Secondary | ICD-10-CM | POA: Diagnosis not present

## 2018-10-10 DIAGNOSIS — Z8673 Personal history of transient ischemic attack (TIA), and cerebral infarction without residual deficits: Secondary | ICD-10-CM | POA: Insufficient documentation

## 2018-10-10 DIAGNOSIS — Z7982 Long term (current) use of aspirin: Secondary | ICD-10-CM | POA: Insufficient documentation

## 2018-10-10 DIAGNOSIS — R05 Cough: Secondary | ICD-10-CM | POA: Diagnosis present

## 2018-10-10 DIAGNOSIS — I10 Essential (primary) hypertension: Secondary | ICD-10-CM | POA: Diagnosis not present

## 2018-10-10 DIAGNOSIS — Z79899 Other long term (current) drug therapy: Secondary | ICD-10-CM | POA: Insufficient documentation

## 2018-10-10 MED ORDER — FLUCONAZOLE 150 MG PO TABS: 150.0000 mg | ORAL_TABLET | Freq: Once | ORAL | 0 refills | Status: AC

## 2018-10-10 MED ORDER — AZITHROMYCIN 250 MG PO TABS: ORAL_TABLET | ORAL | 0 refills | Status: DC

## 2018-10-10 MED FILL — AZITHROMYCIN 250 MG TABLET: 250 | 5 days supply | Qty: 6 | Fill #0

## 2018-10-10 MED FILL — FLUCONAZOLE 150 MG TABS: 150 | 1 days supply | Qty: 1 | Fill #0

## 2018-10-10 NOTE — Progress Notes (Signed)
Patient ID: CARAL WHAN, female   DOB: [birth year], 59 y.o.   MRN: [id number]      Carrigan Delafuente, is a 57 y.o. female  BHA:[id number]  XBD:[id number]  DOB - 1961-09-06  Subjective:  Chief Complaint and HPI: Pretty Weltman is a 57 y.o. female here today for 2-3 week h/o cough and congestion.  Initially she had a fever but that resolved after the first few days.  She was seen at [facility] about 1 week ago and xray showed "mild pneumonia."  She was prescribed Doxy and it made her dizzy.  She only took 3 days of the doxy.  Stopped it several days ago and the dizziness resolved.  No further fever.  OTCs don't help.  Mucus is thick and green.    ROS:   Constitutional:  No f/c, No night sweats, No unexplained weight loss. EENT:  No vision changes, No blurry vision, No hearing changes. No mouth, throat, or ear problems.  Respiratory: + cough, No SOB Cardiac: No CP, no palpitations GI:  No abd pain, No N/V/D. GU: No Urinary s/sx Musculoskeletal: No joint pain Neuro: No headache, no dizziness, no motor weakness.  Skin: No rash Endocrine:  No polydipsia. No polyuria.  Psych: Denies SI/HI  No problems updated.  ALLERGIES: Allergies  Allergen Reactions  . Elavil [Amitriptyline] Other (See Comments)    DIZZINESS  . Topiramate Er Other (See Comments)    Suicidal Thoughts- Trokendi    PAST MEDICAL HISTORY: Past Medical History:  Diagnosis Date  . Chronic headaches    daily, bitemporal, associated with tingling in face  . Heart murmur   . Hypertension   . Seizures ([place])    partial seizures last one on 10/22/15  On Keppra currently   . Stroke [facility]) 07/05/2013    MEDICATIONS AT HOME: Prior to Admission medications   Medication Sig Start Date End Date Taking? Authorizing Provider  albuterol (PROVENTIL HFA;VENTOLIN HFA) 108 (90 Base) MCG/ACT inhaler Inhale into the lungs every 6 (six) hours as needed for wheezing or shortness of breath.   Yes [provider]  aspirin EC 81  MG tablet Take 1 tablet (81 mg total) by mouth daily. 01/15/17  Yes Tresa Garter, MD  atorvastatin (LIPITOR) 40 MG tablet Take 1 tablet (40 mg total) by mouth daily. 11/21/17  Yes Tresa Garter, MD  benzonatate (TESSALON) 100 MG capsule Take by mouth 2 (two) times daily as needed for cough.   Yes [provider]  gabapentin (NEURONTIN) 300 MG capsule TAKE 2 CAPSULES (600 MG TOTAL) BY MOUTH 3 (THREE) TIMES DAILY. 09/23/18  Yes Charlott Rakes, MD  levETIRAcetam (KEPPRA XR) 500 MG 24 hr tablet Take 1 tablet (500 mg total) by mouth daily. 03/12/18  Yes Dennie Bible, NP  lisinopril-hydrochlorothiazide (PRINZIDE,ZESTORETIC) 20-25 MG tablet Take 1 tablet by mouth daily. 11/21/17  Yes Tresa Garter, MD  acetaminophen-codeine (TYLENOL #3) 300-30 MG tablet Take 1 tablet by mouth every 4 (four) hours as needed. Patient not taking: Reported on 03/11/2018 01/23/18   Tresa Garter, MD  azithromycin [facility]) 250 MG tablet Take 2 today then 1 daily 10/10/18   Argentina Donovan, PA-C  fluconazole (DIFLUCAN) 150 MG tablet Take 1 tablet (150 mg total) by mouth once for 1 dose. After you complete your antibiotic 10/10/18 10/10/18  Argentina Donovan, PA-C  gabapentin (NEURONTIN) 300 MG capsule Take 2 capsules (600 mg total) by mouth 3 (three) times daily. 03/11/18   Charlott Rakes, MD  Objective:  EXAM:   Vitals:   10/10/18 0908  BP: 116/80  Pulse: 86  Resp: 20  Temp: 98.1 F (36.7 C)  TempSrc: Oral  SpO2: 95%  Weight: 262 lb (118.8 kg)    General appearance : A&OX3. NAD. Non-toxic-appearing HEENT: Atraumatic and Normocephalic.  PERRLA. EOM intact.  TM clear B. Mouth-MMM, post pharynx WNL w/o erythema, No PND. Neck: supple, no JVD. No cervical lymphadenopathy. No thyromegaly Chest/Lungs:  Breathing-non-labored, Good air entry bilaterally, breath sounds normal without rales or rhonchi.  There is minimal wheezing B upper lungs CVS: S1 S2 regular, no murmurs,  gallops, rubs  Extremities: Bilateral Lower Ext shows no edema, both legs are warm to touch with = pulse throughout Neurology:  CN II-XII grossly intact, Non focal.   Psych:  TP linear. J/I WNL. Normal speech. Appropriate eye contact and affect.  Skin:  No Rash  Data Review Lab Results  Component Value Date   HGBA1C 5.8 08/01/2018   HGBA1C 5.7 02/07/2017   HGBA1C 5.7 12/30/2015     Assessment & Plan   1. Pneumonia due to Mycoplasma pneumoniae, unspecified laterality, unspecified part of lung Covering for atypicals due to length of illness and "mild pneumonia" on xray 1 week ago by patient history.   - azithromycin (ZITHROMAX) 250 MG tablet; Take 2 today then 1 daily  Dispense: 6 tablet; Refill: 0 - fluconazole (DIFLUCAN) 150 MG tablet; Take 1 tablet (150 mg total) by mouth once for 1 dose. After you complete your antibiotic  Dispense: 1 tablet; Refill: 0 Can use inhaler and tessalon perles she was prescribed last week.    2. Essential hypertension Controlled-continue current regimen.     Patient have been counseled extensively about nutrition and exercise  Return for for appt with Dr Margarita Rana 10/28/2018.  The patient was given clear instructions to go to ER or return to medical center if symptoms don't improve, worsen or new problems develop. The patient verbalized understanding. The patient was told to call to get lab results if they haven't heard anything in the next week.     Freeman Caldron, PA-C [facility] and [facility], [place]   10/10/2018, 9:28 AM

## 2018-10-10 NOTE — Progress Notes (Signed)
Went to Urgent Care last Tuesday for cough.Trace of PNA was seen on CXR  Did not take medication for bacteria d/t side effects (dizziness). She states she has vertigo.   Was given Doxy- took 3 days ProAir- did not take [employer]- does not take

## 2018-10-28 ENCOUNTER — Encounter: Payer: Self-pay | Admitting: Family Medicine

## 2018-10-28 ENCOUNTER — Ambulatory Visit: Payer: Medicaid Other | Attending: Family Medicine | Admitting: Family Medicine

## 2018-10-28 VITALS — BP 116/79 | HR 72 | Temp 98.0°F | Ht 67.0 in | Wt 258.4 lb

## 2018-10-28 DIAGNOSIS — Z982 Presence of cerebrospinal fluid drainage device: Secondary | ICD-10-CM | POA: Insufficient documentation

## 2018-10-28 DIAGNOSIS — R059 Cough, unspecified: Secondary | ICD-10-CM

## 2018-10-28 DIAGNOSIS — Z7982 Long term (current) use of aspirin: Secondary | ICD-10-CM | POA: Diagnosis not present

## 2018-10-28 DIAGNOSIS — R05 Cough: Secondary | ICD-10-CM | POA: Diagnosis not present

## 2018-10-28 DIAGNOSIS — R200 Anesthesia of skin: Secondary | ICD-10-CM | POA: Diagnosis not present

## 2018-10-28 DIAGNOSIS — Z79899 Other long term (current) drug therapy: Secondary | ICD-10-CM | POA: Insufficient documentation

## 2018-10-28 DIAGNOSIS — R7303 Prediabetes: Secondary | ICD-10-CM | POA: Insufficient documentation

## 2018-10-28 DIAGNOSIS — I10 Essential (primary) hypertension: Secondary | ICD-10-CM | POA: Insufficient documentation

## 2018-10-28 DIAGNOSIS — G5712 Meralgia paresthetica, left lower limb: Secondary | ICD-10-CM | POA: Insufficient documentation

## 2018-10-28 DIAGNOSIS — G40909 Epilepsy, unspecified, not intractable, without status epilepticus: Secondary | ICD-10-CM | POA: Insufficient documentation

## 2018-10-28 DIAGNOSIS — G40109 Localization-related (focal) (partial) symptomatic epilepsy and epileptic syndromes with simple partial seizures, not intractable, without status epilepticus: Secondary | ICD-10-CM

## 2018-10-28 DIAGNOSIS — Z8673 Personal history of transient ischemic attack (TIA), and cerebral infarction without residual deficits: Secondary | ICD-10-CM | POA: Diagnosis not present

## 2018-10-28 MED ORDER — BENZONATATE 100 MG PO CAPS: 100.0000 mg | ORAL_CAPSULE | Freq: Three times a day (TID) | ORAL | 0 refills | Status: DC | PRN

## 2018-10-28 MED ORDER — ATORVASTATIN CALCIUM 40 MG PO TABS: 40.0000 mg | ORAL_TABLET | Freq: Every day | ORAL | 1 refills | Status: DC

## 2018-10-28 MED ORDER — LISINOPRIL-HYDROCHLOROTHIAZIDE 20-25 MG PO TABS: 1.0000 | ORAL_TABLET | Freq: Every day | ORAL | 1 refills | Status: DC

## 2018-10-28 MED ORDER — GABAPENTIN 300 MG PO CAPS: 600.0000 mg | ORAL_CAPSULE | Freq: Three times a day (TID) | ORAL | 1 refills | Status: DC

## 2018-10-28 NOTE — Progress Notes (Signed)
Subjective:  Patient ID: Alison Ward, female    DOB: [birth year]  Age: 57 y.o. MRN: [id number]  CC: Seizures and Cough   HPI Alison Ward  is a 56 y.o. female right-handed female with a medical history of hypertension, chronic headaches, previous history of right  Hemorrhagic stroke status post ventriculostomy in 06/2013, prediabetes who comes in here for follow-up visit. She reports doing well and her chronic headaches are controlled on gabapentin; She has had no recent seizures and is doing well on Keppra. She was treated for Pneumonia 2 weeks ago and has completed her course of Azithromycin with improvement in her symptoms however she still has a residual cough which is dry. She has no fever, dyspnea, chest pain or wheezing. Doing well on her antihypertensive and statin.  Past Medical History:  Diagnosis Date  . Chronic headaches    daily, bitemporal, associated with tingling in face  . Heart murmur   . Hypertension   . Seizures ([place])    partial seizures last one on 10/22/15  On Keppra currently   . Stroke [facility]) 07/05/2013    Past Surgical History:  Procedure Laterality Date  . ABDOMINAL HYSTERECTOMY N/A 12/15/2015   Procedure: HYSTERECTOMY ABDOMINAL;  Surgeon: Emily Filbert, MD;  Location: [facility];  Service: Gynecology;  Laterality: N/A;  . CESAREAN SECTION    . ceserean section    . SALPINGOOPHORECTOMY Bilateral 12/15/2015   Procedure: SALPINGO OOPHORECTOMY;  Surgeon: Emily Filbert, MD;  Location: [facility];  Service: Gynecology;  Laterality: Bilateral;  . VENTRICULOSTOMY Left 07/07/2013   Procedure: VENTRICULOSTOMY;  Surgeon: Ophelia Charter, MD;  Location: [facility];  Service: Neurosurgery;  Laterality: Left;  Left Ventriculostomy and Removal of Right Vetriculostomy    Allergies  Allergen Reactions  . Elavil [Amitriptyline] Other (See Comments)    DIZZINESS  . Topiramate Er Other (See Comments)    Suicidal Thoughts- Trokendi     Outpatient Medications Prior to  Visit  Medication Sig Dispense Refill  . aspirin EC 81 MG tablet Take 1 tablet (81 mg total) by mouth daily. 30 tablet 1  . levETIRAcetam (KEPPRA XR) 500 MG 24 hr tablet Take 1 tablet (500 mg total) by mouth daily. 90 tablet 2  . atorvastatin (LIPITOR) 40 MG tablet Take 1 tablet (40 mg total) by mouth daily. 90 tablet 3  . gabapentin (NEURONTIN) 300 MG capsule Take 2 capsules (600 mg total) by mouth 3 (three) times daily. 540 capsule 0  . gabapentin (NEURONTIN) 300 MG capsule TAKE 2 CAPSULES (600 MG TOTAL) BY MOUTH 3 (THREE) TIMES DAILY. 540 capsule 0  . lisinopril-hydrochlorothiazide (PRINZIDE,ZESTORETIC) 20-25 MG tablet Take 1 tablet by mouth daily. 90 tablet 3  . albuterol (PROVENTIL HFA;VENTOLIN HFA) 108 (90 Base) MCG/ACT inhaler Inhale into the lungs every 6 (six) hours as needed for wheezing or shortness of breath.    Marland Kitchen acetaminophen-codeine (TYLENOL #3) 300-30 MG tablet Take 1 tablet by mouth every 4 (four) hours as needed. (Patient not taking: Reported on 03/11/2018) 60 tablet 0  . azithromycin (ZITHROMAX) 250 MG tablet Take 2 today then 1 daily (Patient not taking: Reported on 10/28/2018) 6 tablet 0  . benzonatate (TESSALON) 100 MG capsule Take by mouth 2 (two) times daily as needed for cough.     No facility-administered medications prior to visit.     ROS Review of Systems  Constitutional: Negative for activity change, appetite change and fatigue.  HENT: Negative for congestion, sinus pressure and sore throat.  Eyes: Negative for visual disturbance.  Respiratory: Negative for cough, chest tightness, shortness of breath and wheezing.   Cardiovascular: Negative for chest pain and palpitations.  Gastrointestinal: Negative for abdominal distention, abdominal pain and constipation.  Endocrine: Negative for polydipsia.  Genitourinary: Negative for dysuria and frequency.  Musculoskeletal: Negative for arthralgias and back pain.  Skin: Negative for rash.  Neurological: Negative for  tremors, light-headedness and numbness.  Hematological: Does not bruise/bleed easily.  Psychiatric/Behavioral: Negative for agitation and behavioral problems.    Objective:  BP 116/79   Pulse 72   Temp 98 F (36.7 C) (Oral)   Ht 5' 7"  (1.702 m)   Wt 258 lb 6.4 oz (117.2 kg)   LMP 11/24/2011   SpO2 97%   BMI 40.47 kg/m   BP/Weight 10/28/2018 10/10/2018 83/03/6293  Systolic BP 765 465 035  Diastolic BP 79 80 70  Wt. (Lbs) 258.4 262 257.4  BMI 40.47 41.04 40.31      Physical Exam Constitutional:      Appearance: She is well-developed.  HENT:     Right Ear: Tympanic membrane normal.     Left Ear: Tympanic membrane normal.     Mouth/Throat:     Pharynx: No posterior oropharyngeal erythema.  Cardiovascular:     Rate and Rhythm: Normal rate.     Heart sounds: Normal heart sounds. No murmur.  Pulmonary:     Effort: Pulmonary effort is normal.     Breath sounds: Normal breath sounds. No wheezing or rales.  Chest:     Chest wall: No tenderness.  Abdominal:     General: Bowel sounds are normal. There is no distension.     Palpations: Abdomen is soft. There is no mass.     Tenderness: There is no abdominal tenderness.  Musculoskeletal: Normal range of motion.  Neurological:     Mental Status: She is alert and oriented to person, place, and time.  Psychiatric:        Mood and Affect: Mood normal.        Behavior: Behavior normal.      Assessment & Plan:   1. Essential hypertension, benign Controlled Counseled on blood pressure goal of less than 130/80, low-sodium, DASH diet, medication compliance, 150 minutes of moderate intensity exercise per week. Discussed medication compliance, adverse effects. - CMP14+EGFR - lisinopril-hydrochlorothiazide (PRINZIDE,ZESTORETIC) 20-25 MG tablet; Take 1 tablet by mouth daily.  Dispense: 90 tablet; Refill: 1  2. Left sided numbness Controlled - gabapentin (NEURONTIN) 300 MG capsule; Take 2 capsules (600 mg total) by mouth 3  (three) times daily.  Dispense: 540 capsule; Refill: 1  3. Meralgia paresthetica of left side Controlled on Gabapentin  4. Simple partial seizure disorder (HCC) No recent seizures Continue Keppra  5. Cough If cough is uncontrolled on Tessalon Perles, may have to discontinue lisinopril - benzonatate (TESSALON) 100 MG capsule; Take 1 capsule (100 mg total) by mouth 3 (three) times daily as needed for cough.  Dispense: 30 capsule; Refill: 0   Meds ordered this encounter  Medications  . lisinopril-hydrochlorothiazide (PRINZIDE,ZESTORETIC) 20-25 MG tablet    Sig: Take 1 tablet by mouth daily.    Dispense:  90 tablet    Refill:  1  . gabapentin (NEURONTIN) 300 MG capsule    Sig: Take 2 capsules (600 mg total) by mouth 3 (three) times daily.    Dispense:  540 capsule    Refill:  1  . benzonatate (TESSALON) 100 MG capsule    Sig: Take 1 capsule (100  mg total) by mouth 3 (three) times daily as needed for cough.    Dispense:  30 capsule    Refill:  0  . atorvastatin (LIPITOR) 40 MG tablet    Sig: Take 1 tablet (40 mg total) by mouth daily.    Dispense:  90 tablet    Refill:  1    Follow-up: Return in about 6 months (around 04/29/2019) for Follow-up of chronic medical conditions.   Charlott Rakes MD

## 2018-10-28 NOTE — Patient Instructions (Signed)
Cough, Adult  Coughing is a reflex that clears your throat and your airways. Coughing helps to heal and protect your lungs. It is normal to cough occasionally, but a cough that happens with other symptoms or lasts a long time may be a sign of a condition that needs treatment. A cough may last only 2-3 weeks (acute), or it may last longer than 8 weeks (chronic). What are the causes? Coughing is commonly caused by:  Breathing in substances that irritate your lungs.  A viral or bacterial respiratory infection.  Allergies.  Asthma.  Postnasal drip.  Smoking.  Acid backing up from the stomach into the esophagus (gastroesophageal reflux).  Certain medicines.  Chronic lung problems, including COPD (or rarely, lung cancer).  Other medical conditions such as heart failure. Follow these instructions at home: Pay attention to any changes in your symptoms. Take these actions to help with your discomfort:  Take medicines only as told by your health care provider. ? If you were prescribed an antibiotic medicine, take it as told by your health care provider. Do not stop taking the antibiotic even if you start to feel better. ? Talk with your health care provider before you take a cough suppressant medicine.  Drink enough fluid to keep your urine clear or pale yellow.  If the air is dry, use a cold steam vaporizer or humidifier in your bedroom or your home to help loosen secretions.  Avoid anything that causes you to cough at work or at home.  If your cough is worse at night, try sleeping in a semi-upright position.  Avoid cigarette smoke. If you smoke, quit smoking. If you need help quitting, ask your health care provider.  Avoid caffeine.  Avoid alcohol.  Rest as needed. Contact a health care provider if:  You have new symptoms.  You cough up pus.  Your cough does not get better after 2-3 weeks, or your cough gets worse.  You cannot control your cough with suppressant  medicines and you are losing sleep.  You develop pain that is getting worse or pain that is not controlled with pain medicines.  You have a fever.  You have unexplained weight loss.  You have night sweats. Get help right away if:  You cough up blood.  You have difficulty breathing.  Your heartbeat is very fast. This information is not intended to replace advice given to you by your health care provider. Make sure you discuss any questions you have with your health care provider. Document Released: 04/14/2011 Document Revised: 03/23/2016 Document Reviewed: 12/23/2014 Elsevier Interactive Patient Education  2019 Elsevier Inc.  

## 2018-10-29 ENCOUNTER — Telehealth: Payer: Self-pay

## 2018-10-29 LAB — CMP14+EGFR
ALBUMIN: 4.3 g/dL (ref 3.5–5.5)
ALT: 13 IU/L (ref 0–32)
AST: 21 IU/L (ref 0–40)
Albumin/Globulin Ratio: 1.5 (ref 1.2–2.2)
Alkaline Phosphatase: 59 IU/L (ref 39–117)
BUN/Creatinine Ratio: 11 (ref 9–23)
BUN: 12 mg/dL (ref 6–24)
Bilirubin Total: 1 mg/dL (ref 0.0–1.2)
CALCIUM: 9.9 mg/dL (ref 8.7–10.2)
CHLORIDE: 99 mmol/L (ref 96–106)
CO2: 27 mmol/L (ref 20–29)
CREATININE: 1.1 mg/dL — AB (ref 0.57–1.00)
GFR calc non Af Amer: 55 mL/min/{1.73_m2} — ABNORMAL LOW (ref >59)
GFR, EST AFRICAN AMERICAN: 64 mL/min/{1.73_m2} (ref >59)
GLUCOSE: 82 mg/dL (ref 65–99)
Globulin, Total: 2.9 g/dL (ref 1.5–4.5)
Potassium: 4 mmol/L (ref 3.5–5.2)
Sodium: 141 mmol/L (ref 134–144)
TOTAL PROTEIN: 7.2 g/dL (ref 6.0–8.5)

## 2018-10-29 NOTE — Telephone Encounter (Signed)
-----   Message from Charlott Rakes, MD sent at 10/29/2018  9:35 AM EST ----- Please inform the patient that labs are normal. Thank you.

## 2018-10-29 NOTE — Telephone Encounter (Signed)
Patient was called and informed of lab results. 

## 2018-11-05 DIAGNOSIS — H5213 Myopia, bilateral: Secondary | ICD-10-CM | POA: Diagnosis not present

## 2018-11-05 DIAGNOSIS — H5203 Hypermetropia, bilateral: Secondary | ICD-10-CM | POA: Diagnosis not present

## 2018-11-05 DIAGNOSIS — H524 Presbyopia: Secondary | ICD-10-CM | POA: Diagnosis not present

## 2018-11-05 DIAGNOSIS — H52223 Regular astigmatism, bilateral: Secondary | ICD-10-CM | POA: Diagnosis not present

## 2018-11-19 DIAGNOSIS — H524 Presbyopia: Secondary | ICD-10-CM | POA: Diagnosis not present

## 2018-11-19 DIAGNOSIS — H52223 Regular astigmatism, bilateral: Secondary | ICD-10-CM | POA: Diagnosis not present

## 2018-12-21 ENCOUNTER — Other Ambulatory Visit: Payer: Self-pay | Admitting: Nurse Practitioner

## 2018-12-21 DIAGNOSIS — G44229 Chronic tension-type headache, not intractable: Secondary | ICD-10-CM

## 2019-01-09 ENCOUNTER — Other Ambulatory Visit: Payer: Self-pay | Admitting: Nurse Practitioner

## 2019-01-09 DIAGNOSIS — G44229 Chronic tension-type headache, not intractable: Secondary | ICD-10-CM

## 2019-01-24 ENCOUNTER — Other Ambulatory Visit: Payer: Self-pay | Admitting: Nurse Practitioner

## 2019-01-24 DIAGNOSIS — G44229 Chronic tension-type headache, not intractable: Secondary | ICD-10-CM

## 2019-01-27 ENCOUNTER — Other Ambulatory Visit: Payer: Self-pay | Admitting: Family Medicine

## 2019-01-27 DIAGNOSIS — G44229 Chronic tension-type headache, not intractable: Secondary | ICD-10-CM

## 2019-01-27 NOTE — Telephone Encounter (Signed)
1) Medication(s) Requested (by name): levETIRAcetam (KEPPRA XR) 500 MG 24 hr tablet  2) Pharmacy of Choice: [facility] on [address]  3) Special Requests: Pt would like a 53month supply due to covid-19 Approved medications will be sent to the pharmacy, we will reach out if there is an issue.  Requests made after 3pm may not be addressed until the following business day!  If a patient is unsure of the name of the medication(s) please note and ask patient to call back when they are able to provide all info, do not send to responsible party until all information is available!

## 2019-01-28 NOTE — Telephone Encounter (Signed)
This is usually prescribed by her Neurologist

## 2019-01-30 ENCOUNTER — Other Ambulatory Visit: Payer: Self-pay | Admitting: Nurse Practitioner

## 2019-01-30 DIAGNOSIS — G44229 Chronic tension-type headache, not intractable: Secondary | ICD-10-CM

## 2019-02-03 ENCOUNTER — Telehealth: Payer: Self-pay | Admitting: *Deleted

## 2019-02-03 ENCOUNTER — Encounter: Payer: Self-pay | Admitting: *Deleted

## 2019-02-03 NOTE — Telephone Encounter (Addendum)
Spoke to pt and since needing refill on keppra, offered video visit for refills.   She is doing ok, other then having some balance issues. Due to current COVID 19 pandemic, our office is severely reducing in office visits for at least the next 2 weeks, in order to minimize the risk to our patients and healthcare providers.  Pt understands that although there may be some limitations with this type of visit, we will take all precautions to reduce any security or privacy concerns.  Pt understands that this will be treated like an in office visit and we will file with pt's insurance, Medicaid. Pt's email is [email or web address]. Pt understands that the [employer] webex software must be downloaded and operational on the device pt plans to use for the visit.  Went over allergies, medications, med/surg history (she said none). I made appt with Dorothea Glassman on 02-04-19 at 0945.

## 2019-02-04 ENCOUNTER — Ambulatory Visit (INDEPENDENT_AMBULATORY_CARE_PROVIDER_SITE_OTHER): Payer: Medicaid Other | Admitting: Adult Health

## 2019-02-04 ENCOUNTER — Other Ambulatory Visit: Payer: Self-pay

## 2019-02-04 ENCOUNTER — Telehealth: Payer: Self-pay | Admitting: Adult Health

## 2019-02-04 NOTE — Telephone Encounter (Signed)
I called and Brittney CMA tried to trouble shoot with pt. She did not have web ex download on her lap top. PT created an account with [employer]. Pt stated she was not told to download when she made an account. I gave her [email or web address] to download on her computer. Pt tried several times. We made the decision to do telephone visit.

## 2019-02-04 NOTE — Progress Notes (Deleted)
[facility] [address] [place]. [place] [address] [phone]       VIRTUAL VISIT FOLLOW UP NOTE  [name] Date of Birth:  10/06/1961 Medical Record Number:  [id number]   Reason for Referral:  hospital stroke follow up    Virtual Visit via Video Note  I connected with Alison Ward on 02/04/19 at  9:45 AM EDT by a video enabled telemedicine application located at [facility] in [place], [place] and verified that I am speaking with the correct person using two identifiers who was located at their own home.   I discussed the limitations of evaluation and management by telemedicine and the availability of in person appointments. The patient expressed understanding and agreed to proceed.   CHIEF COMPLAINT:  No chief complaint on file.   HPI: Alison Ward is a 58 y.o. African American female with remote history of right temple parenchymal intracerebral hemorrhage in 07/2015 who was last evaluated in this office on 11/27/2017 for follow-up regarding seizure disorder, headaches and continued left-sided numbness.  She was initially scheduled today for follow-up face-to-face visit but due to COVID-19 safety precautions, visit transitioned to telemedicine via WebEx.  She has continued on Keppra 500 mg daily without any recurrent seizure activity since 2016 and tolerated medication well.  She continues on gabapentin 600 mg 3 times daily which has been beneficial for her headaches.  She has trialed Trokendi in the past for complaints of paresthesias but this was discontinued due to side effects of suicidal thoughts.     ROS:   14 system review of systems performed and negative with exception of ***  PMH:  Past Medical History:  Diagnosis Date  . Chronic headaches    daily, bitemporal, associated with tingling in face  . Heart murmur   . Hypertension   . Seizures ([place])    partial seizures last one on 10/22/15  On Keppra currently    . Stroke ([place]) 07/05/2013    PSH:  Past Surgical History:  Procedure Laterality Date  . ABDOMINAL HYSTERECTOMY N/A 12/15/2015   Procedure: HYSTERECTOMY ABDOMINAL;  Surgeon: Emily Filbert, MD;  Location: [facility];  Service: Gynecology;  Laterality: N/A;  . CESAREAN SECTION    . ceserean section    . SALPINGOOPHORECTOMY Bilateral 12/15/2015   Procedure: SALPINGO OOPHORECTOMY;  Surgeon: Emily Filbert, MD;  Location: [facility];  Service: Gynecology;  Laterality: Bilateral;  . VENTRICULOSTOMY Left 07/07/2013   Procedure: VENTRICULOSTOMY;  Surgeon: Ophelia Charter, MD;  Location: [facility];  Service: Neurosurgery;  Laterality: Left;  Left Ventriculostomy and Removal of Right Vetriculostomy    Social History:  Social History   Socioeconomic History  . Marital status: Divorced    Spouse name: Not on file  . Number of children: 3  . Years of education: college  . Highest education level: Not on file  Occupational History  . Occupation: [employer]  Social Needs  . Financial resource strain: Not on file  . Food insecurity:    Worry: Not on file    Inability: Not on file  . Transportation needs:    Medical: Not on file    Non-medical: Not on file  Tobacco Use  . Smoking status: Never Smoker  . Smokeless tobacco: Never Used  Substance and Sexual Activity  . Alcohol use: No  . Drug use: No  . Sexual activity: Not on file  Lifestyle  . Physical activity:    Days per  week: Not on file    Minutes per session: Not on file  . Stress: Not on file  Relationships  . Social connections:    Talks on phone: Not on file    Gets together: Not on file    Attends religious service: Not on file    Active member of club or organization: Not on file    Attends meetings of clubs or organizations: Not on file    Relationship status: Not on file  . Intimate partner violence:    Fear of current or ex partner: Not on file    Emotionally abused: Not on file    Physically abused: Not on file     Forced sexual activity: Not on file  Other Topics Concern  . Not on file  Social History Narrative  . Not on file    Family History:  Family History  Problem Relation Age of Onset  . Hypertension Mother   . Diabetes Mother   . Stroke Mother   . Hypertension Father   . Colon cancer Neg Hx   . Breast cancer Neg Hx     Medications:   Current Outpatient Medications on File Prior to Visit  Medication Sig Dispense Refill  . aspirin EC 81 MG tablet Take 1 tablet (81 mg total) by mouth daily. 30 tablet 1  . atorvastatin (LIPITOR) 40 MG tablet Take 1 tablet (40 mg total) by mouth daily. 90 tablet 1  . gabapentin (NEURONTIN) 300 MG capsule Take 2 capsules (600 mg total) by mouth 3 (three) times daily. 540 capsule 1  . levETIRAcetam (KEPPRA XR) 500 MG 24 hr tablet TAKE 1 TABLET BY MOUTH EVERY DAY 30 tablet 0  . lisinopril-hydrochlorothiazide (PRINZIDE,ZESTORETIC) 20-25 MG tablet Take 1 tablet by mouth daily. 90 tablet 1   No current facility-administered medications on file prior to visit.     Allergies:   Allergies  Allergen Reactions  . Elavil [Amitriptyline] Other (See Comments)    DIZZINESS  . Topiramate Er Other (See Comments)    Suicidal Thoughts- Trokendi     Physical Exam  There were no vitals filed for this visit. There is no height or weight on file to calculate BMI. No exam data present  Depression screen [facility] 2/9 10/28/2018  Decreased Interest 0  Down, Depressed, Hopeless 0  PHQ - 2 Score 0  Altered sleeping 0  Tired, decreased energy 0  Change in appetite 0  Feeling bad or failure about yourself  0  Trouble concentrating 0  Moving slowly or fidgety/restless 0  Suicidal thoughts 0  PHQ-9 Score 0  Some recent data might be hidden     General: well developed, well nourished, seated, in no evident distress Head: head normocephalic and atraumatic.   Neck: supple with no carotid or supraclavicular bruits Cardiovascular: regular rate and rhythm, no murmurs  Musculoskeletal: no deformity Skin:  no rash/petichiae Vascular:  Normal pulses all extremities  Neurologic Exam Mental Status: Awake and fully alert. Oriented to place and time. Recent and remote memory intact. Attention span, concentration and fund of knowledge appropriate. Mood and affect appropriate.  Cranial Nerves: Fundoscopic exam reveals sharp disc margins. Pupils equal, briskly reactive to light. Extraocular movements full without nystagmus. Visual fields full to confrontation. Hearing intact. Facial sensation intact. Face, tongue, palate moves normally and symmetrically.  Motor: Normal bulk and tone. Normal strength in all tested extremity muscles. Sensory.: intact to touch , pinprick , position and vibratory sensation.  Coordination: Rapid alternating movements normal  in all extremities. Finger-to-nose and heel-to-shin performed accurately bilaterally. Gait and Station: Arises from chair without difficulty. Stance is normal. Gait demonstrates normal stride length and balance. Able to heel, toe and tandem walk without difficulty.  Reflexes: 1+ and symmetric. Toes downgoing.      ASSESSMENT: Alison Ward is a 58 y.o. 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  Remote history of hypertensive right temporal intracerebral hemorrhage in 2014.   PLAN:  Continue Keppra at current dosage as she has been stable and tolerating well Continue gabapentin 600 mg 3 times daily Continue aspirin 81 mg and atorvastatin 40 mg for secondary stroke prevention    Follow up in *** or call earlier if needed   Greater than 50% of time during this 25 minute visit was spent on counseling, explanation of diagnosis of ***, reviewing risk factor management of ***, planning of further management along with potential future management,  and discussion with patient and family answering all questions.    Venancio Poisson, AGNP-BC  [facility] [address] [place], [place] [address]  Phone [phone] Fax [phone] Note: This document was prepared with digital dictation and possible smart phrase technology. Any transcriptional errors that result from this process are unintentional.

## 2019-02-04 NOTE — Telephone Encounter (Signed)
Patient is wondering if her virtual visit is going to be rescheduled because her Janett Billow could not connect . Patient will need another e-mail sent to her.

## 2019-02-04 NOTE — Telephone Encounter (Signed)
Note    I called and Brittney CMA tried to trouble shoot with pt. She did not have web ex download on her lap top. PT created an account with [employer]. Pt stated she was not told to download when she made an account. I gave her [email or web address] to download on her computer. Pt tried several times. We made the decision to do telephone visit.

## 2019-02-04 NOTE — Telephone Encounter (Signed)
Provider got on video visit. Brittney CMA tried to trouble shoot with pt. PT stated she was unable to see provider. Will r/s for tomorrow. VM was left for pt to call back to verify email.

## 2019-02-05 ENCOUNTER — Encounter: Payer: Self-pay | Admitting: Adult Health

## 2019-02-05 ENCOUNTER — Other Ambulatory Visit: Payer: Self-pay

## 2019-02-05 ENCOUNTER — Ambulatory Visit (INDEPENDENT_AMBULATORY_CARE_PROVIDER_SITE_OTHER): Payer: Medicaid Other | Admitting: Adult Health

## 2019-02-05 DIAGNOSIS — G44229 Chronic tension-type headache, not intractable: Secondary | ICD-10-CM

## 2019-02-05 DIAGNOSIS — R2689 Other abnormalities of gait and mobility: Secondary | ICD-10-CM

## 2019-02-05 DIAGNOSIS — I6930 Unspecified sequelae of cerebral infarction: Secondary | ICD-10-CM | POA: Diagnosis not present

## 2019-02-05 DIAGNOSIS — G40109 Localization-related (focal) (partial) symptomatic epilepsy and epileptic syndromes with simple partial seizures, not intractable, without status epilepticus: Secondary | ICD-10-CM

## 2019-02-05 MED ORDER — LEVETIRACETAM ER 500 MG PO TB24: 500.0000 mg | ORAL_TABLET | Freq: Every day | ORAL | 3 refills | Status: DC

## 2019-02-05 NOTE — Progress Notes (Signed)
[facility] [address] [place]. [place] [address] [phone]       VIRTUAL VISIT FOLLOW UP NOTE  Ms. LATONJA BOBECK Date of Birth:  August 31, 1961 Medical Record Number:  [id number]   Virtual Visit via Telephone Note  I connected with [name] on 02/05/19 at  9:15 AM EDT by telephone located remotely within my own home and verified that I am speaking with the correct person using two identifiers who was located within their own home.   I discussed the limitations, risks, security and privacy concerns of performing an evaluation and management service by telephone and the availability of in person appointments. I also discussed with the patient that there may be a patient responsible charge related to this service. The patient expressed understanding and agreed to proceed.   CHIEF COMPLAINT:  No chief complaint on file.   HPI: GWYNNE KEMNITZ is a 58 y.o. African American female with remote history of right temple parenchymal intracerebral hemorrhage in 07/2015 who was last evaluated in this office on 11/27/2017 for follow-up regarding seizure disorder, headaches and continued left-sided numbness.  She was initially scheduled today for follow-up face-to-face visit but due to COVID-19 safety precautions, visit transitioned to telemedicine via WebEx.  She unfortunately had difficulty downloading software to do WebEx visit therefore transitioned to telephone visit.  She has continued on Keppra 500 mg daily XR without noted side effects. She does endorse having a couple episodes consisting of feeling as though she may lose consciousness but no consciousness lost. Events occurred on 08/2018 and 10/2018 with both events occurring in the middle of night while her granddaughter was having a difficult time sleeping. She endorses sensation of about to lose consciousness, hazy/dark vision but denies loss of consciousness. Episodes lasted for a couple minutes and then resolved.  EMS was calling for first episode but did not have to proceed to ED as she was stable. She does endorse having a lower BP during the time but unable to remember exact level. She also endorses increased stress and headache prior to event. She denies any other episodes occurring or seizure like activity. She hsa continued on gabapentin 600mg  TID which has been beneficial for her headaches with only headache occurences as mentioned above. She also endorses balance difficulties after driving long distances where she will have to sit in her car and wait for dizziness/balance concerns to resolve. She denies this sensation occurring when she is a passenger. Denies any other difficulties with balance. She has not been driving as of now due to [place] safety precautions. No further concerns at this time.    ROS:   14 system review of systems performed and negative with exception of dizziness, balance, headaches, seizures  PMH:  Past Medical History:  Diagnosis Date   Chronic headaches    daily, bitemporal, associated with tingling in face   Heart murmur    Hypertension    Seizures ([place])    partial seizures last one on 10/22/15  On Keppra currently    Stroke ([place]) 07/05/2013    PSH:  Past Surgical History:  Procedure Laterality Date   ABDOMINAL HYSTERECTOMY N/A 12/15/2015   Procedure: HYSTERECTOMY ABDOMINAL;  Surgeon: Emily Filbert, MD;  Location: [facility];  Service: Gynecology;  Laterality: N/A;   CESAREAN SECTION     ceserean section     SALPINGOOPHORECTOMY Bilateral 12/15/2015   Procedure: SALPINGO OOPHORECTOMY;  Surgeon: Emily Filbert, MD;  Location: [facility];  Service: Gynecology;  Laterality:  Bilateral;   VENTRICULOSTOMY Left 07/07/2013   Procedure: VENTRICULOSTOMY;  Surgeon: Ophelia Charter, MD;  Location: [facility];  Service: Neurosurgery;  Laterality: Left;  Left Ventriculostomy and Removal of Right Vetriculostomy    Social History:  Social History   Socioeconomic History    Marital status: Divorced    Spouse name: Not on file   Number of children: 3   Years of education: college   Highest education level: Not on file  Occupational History   Occupation: [employer]  Social Needs   Financial resource strain: Not on file   Food insecurity:    Worry: Not on file    Inability: Not on file   Transportation needs:    Medical: Not on file    Non-medical: Not on file  Tobacco Use   Smoking status: Never Smoker   Smokeless tobacco: Never Used  Substance and Sexual Activity   Alcohol use: No   Drug use: No   Sexual activity: Not on file  Lifestyle   Physical activity:    Days per week: Not on file    Minutes per session: Not on file   Stress: Not on file  Relationships   Social connections:    Talks on phone: Not on file    Gets together: Not on file    Attends religious service: Not on file    Active member of club or organization: Not on file    Attends meetings of clubs or organizations: Not on file    Relationship status: Not on file   Intimate partner violence:    Fear of current or ex partner: Not on file    Emotionally abused: Not on file    Physically abused: Not on file    Forced sexual activity: Not on file  Other Topics Concern   Not on file  Social History Narrative   Not on file    Family History:  Family History  Problem Relation Age of Onset   Hypertension Mother    Diabetes Mother    Stroke Mother    Hypertension Father    Colon cancer Neg Hx    Breast cancer Neg Hx     Medications:   Current Outpatient Medications on File Prior to Visit  Medication Sig Dispense Refill   aspirin EC 81 MG tablet Take 1 tablet (81 mg total) by mouth daily. 30 tablet 1   atorvastatin (LIPITOR) 40 MG tablet Take 1 tablet (40 mg total) by mouth daily. 90 tablet 1   gabapentin (NEURONTIN) 300 MG capsule Take 2 capsules (600 mg total) by mouth 3 (three) times daily. 540 capsule 1   levETIRAcetam (KEPPRA XR)  500 MG 24 hr tablet TAKE 1 TABLET BY MOUTH EVERY DAY 30 tablet 0   lisinopril-hydrochlorothiazide (PRINZIDE,ZESTORETIC) 20-25 MG tablet Take 1 tablet by mouth daily. 90 tablet 1   No current facility-administered medications on file prior to visit.     Allergies:   Allergies  Allergen Reactions   Elavil [Amitriptyline] Other (See Comments)    DIZZINESS   Topiramate Er Other (See Comments)    Suicidal Thoughts- Trokendi    OBJECTIVE:  Physical Exam  *limited exam due to visit type*  General: pleasant middle aged female answering and asking questions appropriately   Neurologic Exam Mental Status: Awake and fully alert. Oriented to place and time. Recent and remote memory intact. Attention span, concentration and fund of knowledge appropriate. Mood and affect appropriate.   No recent images  or labs to review   ASSESSMENT: TATIYANNA LASHLEY is a 58 y.o. 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  Remote history of hypertensive right temporal intracerebral hemorrhage in 2014.   PLAN:  Episodes likely related to vasal vagal episodes per symptoms and decrease BP. No indication at this time time to increase keppra but advised her if episodes become more frequent without increased stress, headaches or hypotension then we may consider increasing dosage in the future. Continue Keppra 500mg  XR daily Continue gabapentin 600 mg 3 times daily Continue aspirin 81 mg and atorvastatin 40 mg for secondary stroke prevention Can consider participation in physical therapy again to assist with concerns of balance/dizziness sensation after driving.  Discussion regarding symptoms likely related to increased concentration while driving, rapid eye movement and/or head movement.  Encouraged when she returns to driving after [id number]  safety precautions lifted, to drive short distances and avoid main roads or highways with excessive amount of traffic at this could worsen symptoms   Recommend a follow-up in 1 year but did advise to call office if she would like to participate in therapy sessions or if she has any recurrent frequent episodes    I discussed the assessment and treatment plan with the patient. The patient was provided an opportunity to ask questions and all were answered. The patient agreed with the plan and demonstrated an understanding of the instructions.   I provided 26 minutes of non-face-to-face time during this encounter.    Venancio Poisson, AGNP-BC  [facility] [address] [place], [place] [address]  Phone [phone] Fax [phone] Note: This document was prepared with digital dictation and possible smart phrase technology. Any transcriptional errors that result from this process are unintentional.

## 2019-02-09 NOTE — Progress Notes (Signed)
I agree with the above plan 

## 2019-03-21 ENCOUNTER — Encounter: Payer: Self-pay | Admitting: Gastroenterology

## 2019-04-04 ENCOUNTER — Other Ambulatory Visit: Payer: Self-pay

## 2019-04-04 ENCOUNTER — Ambulatory Visit (AMBULATORY_SURGERY_CENTER): Payer: Medicaid Other | Admitting: *Deleted

## 2019-04-04 VITALS — Ht 68.0 in | Wt 255.0 lb

## 2019-04-04 DIAGNOSIS — Z86010 Personal history of colonic polyps: Secondary | ICD-10-CM

## 2019-04-04 MED ORDER — NA SULFATE-K SULFATE-MG SULF 17.5-3.13-1.6 GM/177ML PO SOLN: 1.0000 | Freq: Once | ORAL | 0 refills | Status: AC

## 2019-04-04 NOTE — Progress Notes (Signed)

## 2019-04-18 ENCOUNTER — Encounter: Payer: Medicaid Other | Admitting: Gastroenterology

## 2019-04-27 ENCOUNTER — Other Ambulatory Visit: Payer: Self-pay | Admitting: Family Medicine

## 2019-04-27 DIAGNOSIS — I10 Essential (primary) hypertension: Secondary | ICD-10-CM

## 2019-04-28 ENCOUNTER — Telehealth: Payer: Self-pay | Admitting: Family Medicine

## 2019-04-28 NOTE — Telephone Encounter (Signed)
1) Medication(s) Requested (by name): lisinopril-hydrochlorothiazide ([place]) 20-25 MG tablet [[id number]]    2) Pharmacy of Choice: [id number] - [place], [place]     3) Special Requests:   Approved medications will be sent to the pharmacy, we will reach out if there is an issue.  Requests made after 3pm may not be addressed until the following business day!  If a patient is unsure of the name of the medication(s) please note and ask patient to call back when they are able to provide all info, do not send to responsible party until all information is available!

## 2019-04-28 NOTE — Telephone Encounter (Signed)
Please have patient schedule appt, then we can authorize a one month refill

## 2019-04-30 ENCOUNTER — Other Ambulatory Visit: Payer: Self-pay

## 2019-04-30 ENCOUNTER — Ambulatory Visit: Payer: Medicaid Other | Attending: Family Medicine | Admitting: Family Medicine

## 2019-04-30 DIAGNOSIS — I10 Essential (primary) hypertension: Secondary | ICD-10-CM | POA: Diagnosis not present

## 2019-04-30 DIAGNOSIS — Z1159 Encounter for screening for other viral diseases: Secondary | ICD-10-CM

## 2019-04-30 DIAGNOSIS — E7800 Pure hypercholesterolemia, unspecified: Secondary | ICD-10-CM

## 2019-04-30 MED ORDER — ATORVASTATIN CALCIUM 40 MG PO TABS
40.0000 mg | ORAL_TABLET | Freq: Every day | ORAL | 1 refills | Status: DC
Start: 2019-04-30 — End: 2019-12-02

## 2019-04-30 MED ORDER — LISINOPRIL-HYDROCHLOROTHIAZIDE 20-25 MG PO TABS: 1.0000 | ORAL_TABLET | Freq: Every day | ORAL | 1 refills | Status: DC

## 2019-04-30 NOTE — Progress Notes (Signed)
Virtual Visit via Telephone Note  I connected with Alison Ward, on 04/30/2019 at 1:53 PM by telephone due to the COVID-19 pandemic and verified that I am speaking with the correct person using two identifiers.   Consent: I discussed the limitations, risks, security and privacy concerns of performing an evaluation and management service by telephone and the availability of in person appointments. I also discussed with the patient that there may be a patient responsible charge related to this service. The patient expressed understanding and agreed to proceed.   Location of Patient: Home  Location of Provider: Clinic   Persons participating in Telemedicine visit: Sarh Kirschenbaum Farrington-CMA Dr. Felecia Shelling     History of Present Illness: Alison Ward  is a 58 y.o. female right-handed female with a medical history of hypertension, chronic headaches, previous history of right  Hemorrhagic stroke status post ventriculostomy in 06/2013, seen for follow-up visit. She has been compliant with her antihypertensive and is needing refills today.  Also taking her statin with no myalgias or other adverse effects. Currently on Keppra which she receives from neurology. She does have meralgia paresthetica and is doing well on gabapentin which she sometimes takes 2 times daily rather than 3 times daily as prescribed.. The ongoing COVID-19 pandemic has had her indoors most of the time and she endorses wearing a mask, maintaining social distancing and washing her hands frequently. Denies chest pain, dyspnea, headaches, pedal edema and feels good today.    Past Medical History:  Diagnosis Date  . Chronic headaches    daily, bitemporal, associated with tingling in face  . Heart murmur   . Hypertension   . Seizures ([place])    partial seizures last one on 10/22/15  On Keppra currently - last seizure 08-2018 x 2 small issues   . Stroke ([place]) 07/05/2013   Allergies  Allergen Reactions   . Elavil [Amitriptyline] Other (See Comments)    DIZZINESS  . Topiramate Er Other (See Comments)    Suicidal Thoughts- Trokendi    Current Outpatient Medications on File Prior to Visit  Medication Sig Dispense Refill  . aspirin EC 81 MG tablet Take 1 tablet (81 mg total) by mouth daily. 30 tablet 1  . atorvastatin (LIPITOR) 40 MG tablet Take 1 tablet (40 mg total) by mouth daily. 90 tablet 1  . gabapentin (NEURONTIN) 300 MG capsule Take 2 capsules (600 mg total) by mouth 3 (three) times daily. 540 capsule 1  . levETIRAcetam (KEPPRA XR) 500 MG 24 hr tablet Take 1 tablet (500 mg total) by mouth daily. 90 tablet 3  . lisinopril-hydrochlorothiazide (PRINZIDE,ZESTORETIC) 20-25 MG tablet Take 1 tablet by mouth daily. 90 tablet 1   No current facility-administered medications on file prior to visit.     Observations/Objective: Awake, alert, oriented x3 Not in acute distress  CMP Latest Ref Rng & Units 10/28/2018 03/12/2018 12/06/2016  Glucose 65 - 99 mg/dL 82 84 84  BUN 6 - 24 mg/dL _0 Creatinine 0.57 - 1.00 mg/dL 1.10(H) 0.95 1.02  Sodium 134 - 144 mmol/L 141 141 141  Potassium 3.5 - 5.2 mmol/L 4.0 4.1 4.1  Chloride 96 - 106 mmol/L 99 99 101  CO2 20 - 29 mmol/L _1 Calcium 8.7 - 10.2 mg/dL 9.9 9.9 9.8  Total Protein 6.0 - 8.5 g/dL 7.2 7.1 7.9  Total Bilirubin 0.0 - 1.2 mg/dL 1.0 0.9 0.9  Alkaline Phos 39 - 117 IU/L 59 64 51  AST 0 -  40 IU/L _0 ALT 0 - 32 IU/L _1 Lipid Panel     Component Value Date/Time   CHOL 129 03/12/2018 1056   TRIG 152 (H) 03/12/2018 1056   HDL 37 (L) 03/12/2018 1056   CHOLHDL 3.5 03/12/2018 1056   CHOLHDL 6.4 (H) 12/06/2016 1012   VLDL 35 (H) 12/06/2016 1012   LDLCALC 62 03/12/2018 1056     Assessment and Plan: 1. Essential hypertension, benign Controlled Counseled on blood pressure goal of less than 130/80, low-sodium, DASH diet, medication compliance, 150 minutes of moderate intensity exercise per week. Discussed  medication compliance, adverse effects. - lisinopril-hydrochlorothiazide (ZESTORETIC) 20-25 MG tablet; Take 1 tablet by mouth daily.  Dispense: 90 tablet; Refill: 1 - CMP14+EGFR; Future - Lipid panel; Future  2. Need for hepatitis C screening test - Hepatitis c antibody (reflex); Future  3. Screening for viral disease - HIV Antibody (routine testing w rflx); Future  4. Pure hypercholesterolemia Controlled on Lipitor   Follow Up Instructions: Return in about 6 months (around 10/31/2019) for medical conditions.    I discussed the assessment and treatment plan with the patient. The patient was provided an opportunity to ask questions and all were answered. The patient agreed with the plan and demonstrated an understanding of the instructions.   The patient was advised to call back or seek an in-person evaluation if the symptoms worsen or if the condition fails to improve as anticipated.     I provided 13 minutes total of non-face-to-face time during this encounter including median intraservice time, reviewing previous notes, labs, imaging, medications, management and patient verbalized understanding.     Charlott Rakes, MD, FAAFP. [facility] [place], [place]   04/30/2019, 1:53 PM

## 2019-04-30 NOTE — Progress Notes (Signed)
Patient has been called and DOB has been verified. Patient has been screened and transferred to PCP to start phone visit.    Patient bp needs refill on medication.

## 2019-05-06 ENCOUNTER — Other Ambulatory Visit: Payer: Self-pay

## 2019-05-06 ENCOUNTER — Ambulatory Visit: Payer: Medicaid Other | Attending: Family Medicine

## 2019-05-06 DIAGNOSIS — I10 Essential (primary) hypertension: Secondary | ICD-10-CM | POA: Diagnosis not present

## 2019-05-06 DIAGNOSIS — Z1159 Encounter for screening for other viral diseases: Secondary | ICD-10-CM | POA: Diagnosis not present

## 2019-05-07 LAB — LIPID PANEL
Chol/HDL Ratio: 4.2 ratio (ref 0.0–4.4)
Cholesterol, Total: 152 mg/dL (ref 100–199)
HDL: 36 mg/dL — ABNORMAL LOW (ref >39)
LDL Calculated: 69 mg/dL (ref 0–99)
Triglycerides: 233 mg/dL — ABNORMAL HIGH (ref 0–149)
VLDL Cholesterol Cal: 47 mg/dL — ABNORMAL HIGH (ref 5–40)

## 2019-05-07 LAB — CMP14+EGFR
ALT: 17 IU/L (ref 0–32)
AST: 24 IU/L (ref 0–40)
Albumin/Globulin Ratio: 1.4 (ref 1.2–2.2)
Albumin: 4.2 g/dL (ref 3.8–4.9)
Alkaline Phosphatase: 67 IU/L (ref 39–117)
BUN/Creatinine Ratio: 14 (ref 12–28)
BUN: 14 mg/dL (ref 8–27)
Bilirubin Total: 1.1 mg/dL (ref 0.0–1.2)
CO2: 27 mmol/L (ref 20–29)
Calcium: 9.8 mg/dL (ref 8.7–10.3)
Chloride: 99 mmol/L (ref 96–106)
Creatinine, Ser: 1.03 mg/dL — ABNORMAL HIGH (ref 0.57–1.00)
GFR calc Af Amer: 68 mL/min/{1.73_m2} (ref >59)
GFR calc non Af Amer: 59 mL/min/{1.73_m2} — ABNORMAL LOW (ref >59)
Globulin, Total: 3.1 g/dL (ref 1.5–4.5)
Glucose: 92 mg/dL (ref 65–99)
Potassium: 3.4 mmol/L — ABNORMAL LOW (ref 3.5–5.2)
Sodium: 142 mmol/L (ref 134–144)
Total Protein: 7.3 g/dL (ref 6.0–8.5)

## 2019-05-07 LAB — HIV ANTIBODY (ROUTINE TESTING W REFLEX): HIV Screen 4th Generation wRfx: NONREACTIVE

## 2019-05-07 LAB — HCV COMMENT:

## 2019-05-07 LAB — HEPATITIS C ANTIBODY (REFLEX): HCV Ab: <0.1 s/co ratio (ref 0.0–0.9)

## 2019-05-08 ENCOUNTER — Telehealth: Payer: Self-pay | Admitting: Family Medicine

## 2019-05-08 NOTE — Telephone Encounter (Signed)
Pt would like her results from her labs completed on 05/06/2019, please follow up when possible.

## 2019-05-08 NOTE — Telephone Encounter (Signed)
Patient was called and given lab results. 

## 2019-05-16 ENCOUNTER — Encounter: Payer: Medicaid Other | Admitting: Gastroenterology

## 2019-05-26 ENCOUNTER — Other Ambulatory Visit: Payer: Self-pay | Admitting: Oral Surgery

## 2019-05-26 DIAGNOSIS — M279 Disease of jaws, unspecified: Secondary | ICD-10-CM

## 2019-05-31 ENCOUNTER — Other Ambulatory Visit: Payer: Self-pay | Admitting: Family Medicine

## 2019-05-31 DIAGNOSIS — R200 Anesthesia of skin: Secondary | ICD-10-CM

## 2019-06-12 ENCOUNTER — Encounter: Payer: Self-pay | Admitting: Gastroenterology

## 2019-06-17 ENCOUNTER — Encounter: Payer: Medicaid Other | Admitting: Gastroenterology

## 2019-06-18 ENCOUNTER — Ambulatory Visit (HOSPITAL_COMMUNITY): Admission: RE | Admit: 2019-06-18 | Payer: Medicaid Other | Source: Ambulatory Visit

## 2019-06-27 ENCOUNTER — Ambulatory Visit (HOSPITAL_COMMUNITY): Payer: Medicaid Other

## 2019-07-04 ENCOUNTER — Other Ambulatory Visit: Payer: Self-pay

## 2019-07-04 ENCOUNTER — Ambulatory Visit (AMBULATORY_SURGERY_CENTER): Payer: Self-pay

## 2019-07-04 VITALS — Ht 68.0 in | Wt 260.0 lb

## 2019-07-04 DIAGNOSIS — Z86010 Personal history of colonic polyps: Secondary | ICD-10-CM

## 2019-07-04 MED ORDER — NA SULFATE-K SULFATE-MG SULF 17.5-3.13-1.6 GM/177ML PO SOLN: 1.0000 | Freq: Once | ORAL | 0 refills | Status: AC

## 2019-07-04 NOTE — Progress Notes (Signed)
Denies allergies to eggs or soy products. Denies complication of anesthesia or sedation. Denies use of weight loss medication. Denies use of O2.   Emmi instructions given for colonoscopy.  

## 2019-07-17 ENCOUNTER — Telehealth: Payer: Self-pay

## 2019-07-17 NOTE — Telephone Encounter (Signed)
Covid-19 screening questions   Do you now or have you had a fever in the last 14 days? NO   Do you have any respiratory symptoms of shortness of breath or cough now or in the last 14 days? NO  Do you have any family members or close contacts with diagnosed or suspected Covid-19 in the past 14 days? NO  Have you been tested for Covid-19 and found to be positive? NO        

## 2019-07-18 ENCOUNTER — Ambulatory Visit (AMBULATORY_SURGERY_CENTER): Payer: Medicaid Other | Admitting: Gastroenterology

## 2019-07-18 ENCOUNTER — Encounter: Payer: Self-pay | Admitting: Gastroenterology

## 2019-07-18 ENCOUNTER — Other Ambulatory Visit: Payer: Self-pay

## 2019-07-18 VITALS — BP 103/51 | HR 63 | Temp 98.3°F | Resp 16 | Ht 68.0 in | Wt 260.0 lb

## 2019-07-18 DIAGNOSIS — Z1211 Encounter for screening for malignant neoplasm of colon: Secondary | ICD-10-CM | POA: Diagnosis not present

## 2019-07-18 DIAGNOSIS — Z86010 Personal history of colonic polyps: Secondary | ICD-10-CM

## 2019-07-18 MED ORDER — SODIUM CHLORIDE 0.9 % IV SOLN: 500.0000 mL | Freq: Once | INTRAVENOUS | Status: DC

## 2019-07-18 NOTE — Op Note (Signed)
[place] Patient Name: Alison Ward Procedure Date: 07/18/2019 2:23 PM MRN: [id number] Endoscopist: Thornton Park MD, MD Age: 58 Referring MD:  Date of Birth: April 18, 1961 Gender: Female Account #: [id number] Procedure:                Colonoscopy Indications:              Surveillance: Personal history of adenomatous                            polyps on last colonoscopy 5 years ago                           Tubular adenoma removed on colonoscopy 02/2014.                            Surveillance recommended in 5 years.                           No known family history of colon cancer or polyps                           No baseline GI symptoms Medicines:                See the Anesthesia note for documentation of the                            administered medications Procedure:                Pre-Anesthesia Assessment:                           - Prior to the procedure, a History and Physical                            was performed, and patient medications and                            allergies were reviewed. The patient's tolerance of                            previous anesthesia was also reviewed. The risks                            and benefits of the procedure and the sedation                            options and risks were discussed with the patient.                            All questions were answered, and informed consent                            was obtained. Prior Anticoagulants: The patient has  taken no previous anticoagulant or antiplatelet                            agents. ASA Grade Assessment: III - A patient with                            severe systemic disease. After reviewing the risks                            and benefits, the patient was deemed in                            satisfactory condition to undergo the procedure.                           After obtaining informed consent, the colonoscope               was passed under direct vision. Throughout the                            procedure, the patient's blood pressure, pulse, and                            oxygen saturations were monitored continuously. The                            Colonoscope was introduced through the anus and                            advanced to the the terminal ileum, with                            identification of the appendiceal orifice and IC                            valve. A second forward view of the right colon was                            performed. The colonoscopy was performed with                            moderate difficulty due to a redundant colon,                            significant looping and a tortuous colon.                            Successful completion of the procedure was aided by                            changing the patient to a supine position and                            applying  abdominal pressure. The patient tolerated                            the procedure well. The quality of the bowel                            preparation was excellent. The ileocecal valve,                            appendiceal orifice, and rectum were photographed. Scope In: 2:26:31 PM Scope Out: 2:44:32 PM Scope Withdrawal Time: 0 hours 9 minutes 1 second  Total Procedure Duration: 0 hours 18 minutes 1 second  Findings:                 The perianal and digital rectal examinations were                            normal.                           Non-bleeding internal hemorrhoids were found.                           A few small-mouthed diverticula were found in the                            sigmoid colon.                           The exam was otherwise without abnormality on                            direct and retroflexion views. Complications:            No immediate complications. Estimated Blood Loss:     Estimated blood loss: none. Estimated blood loss:                             none. Impression:               - Non-bleeding internal hemorrhoids.                           - Diverticulosis in the sigmoid colon.                           - The examination was otherwise normal on direct                            and retroflexion views.                           - No specimens collected. Recommendation:           - Patient has a contact number available for                            emergencies. The signs and symptoms of potential  delayed complications were discussed with the                            patient. Return to normal activities tomorrow.                            Written discharge instructions were provided to the                            patient.                           - Resume previous diet today. High fiber diet                            recommended.                           - Continue present medications.                           - Repeat colonoscopy in 7 years for surveillance                            given the personal history of a tubular adenoma. Thornton Park MD, MD 07/18/2019 2:50:17 PM This report has been signed electronically.

## 2019-07-18 NOTE — Patient Instructions (Addendum)
HANDOUTS GIVEN : DIVERTICULOSIS, HIGH FIBER DIET,  HEMORRHOIDS  YOU HAD AN ENDOSCOPIC PROCEDURE TODAY AT [facility]:   Refer to the procedure report that was given to you for any specific questions about what was found during the examination.  If the procedure report does not answer your questions, please call your gastroenterologist to clarify.  If you requested that your care partner not be given the details of your procedure findings, then the procedure report has been included in a sealed envelope for you to review at your convenience later.  YOU SHOULD EXPECT: Some feelings of bloating in the abdomen. Passage of more gas than usual.  Walking can help get rid of the air that was put into your GI tract during the procedure and reduce the bloating. If you had a lower endoscopy (such as a colonoscopy or flexible sigmoidoscopy) you may notice spotting of blood in your stool or on the toilet paper. If you underwent a bowel prep for your procedure, you may not have a normal bowel movement for a few days.  Please Note:  You might notice some irritation and congestion in your nose or some drainage.  This is from the oxygen used during your procedure.  There is no need for concern and it should clear up in a day or so.  SYMPTOMS TO REPORT IMMEDIATELY:   Following lower endoscopy (colonoscopy or flexible sigmoidoscopy):  Excessive amounts of blood in the stool  Significant tenderness or worsening of abdominal pains  Swelling of the abdomen that is new, acute  Fever of 100F or higher   For urgent or emergent issues, a gastroenterologist can be reached at any hour by calling [phone].   DIET:  We do recommend a small meal at first, but then you may proceed to your regular diet.  Drink plenty of fluids but you should avoid alcoholic beverages for 24 hours.  ACTIVITY:  You should plan to take it easy for the rest of today and you should NOT DRIVE or use heavy machinery until  tomorrow (because of the sedation medicines used during the test).    FOLLOW UP: Our staff will call the number listed on your records 48-72 hours following your procedure to check on you and address any questions or concerns that you may have regarding the information given to you following your procedure. If we do not reach you, we will leave a message.  We will attempt to reach you two times.  During this call, we will ask if you have developed any symptoms of COVID 19. If you develop any symptoms (ie: fever, flu-like symptoms, shortness of breath, cough etc.) before then, please call [phone].  If you test positive for Covid 19 in the 2 weeks post procedure, please call and report this information to [place].    If any biopsies were taken you will be contacted by phone or by letter within the next 1-3 weeks.  Please call us at [phone] if you have not heard about the biopsies in 3 weeks.    SIGNATURES/CONFIDENTIALITY: You and/or your care partner have signed paperwork which will be entered into your electronic medical record.  These signatures attest to the fact that that the information above on your After Visit Summary has been reviewed and is understood.  Full responsibility of the confidentiality of this discharge information lies with you and/or your care-partner.

## 2019-07-18 NOTE — Progress Notes (Signed)
PT taken to PACU. Monitors in place. VSS. Report given to RN. 

## 2019-07-18 NOTE — Progress Notes (Signed)
Pt's states no medical or surgical changes since previsit or office visit.  Temp KA VS CW 

## 2019-07-22 ENCOUNTER — Telehealth: Payer: Self-pay

## 2019-07-22 NOTE — Telephone Encounter (Signed)
  Follow up Call-  Call back number 07/18/2019  Post procedure Call Back phone  # [phone]  Permission to leave phone message Yes  Some recent data might be hidden     Patient questions:  Do you have a fever, pain , or abdominal swelling? No. Pain Score  0 *  Have you tolerated food without any problems? yes Have you been able to return to your normal activities? Yes.    Do you have any questions about your discharge instructions: Diet   No. Medications  No. Follow up visit  No.  Do you have questions or concerns about your Care? No.  Actions: * If pain score is 4 or above: No action needed, pain <4.  1. Have you developed a fever since your procedure? no  2.   Have you had an respiratory symptoms (SOB or cough) since your procedure? no  3.   Have you tested positive for COVID 19 since your procedure no  4.   Have you had any family members/close contacts diagnosed with the COVID 19 since your procedure?  no   If yes to any of these questions please route to Joylene John, RN and Alphonsa Gin, Therapist, sports.

## 2019-08-19 ENCOUNTER — Other Ambulatory Visit: Payer: Self-pay

## 2019-08-19 ENCOUNTER — Ambulatory Visit: Payer: Medicaid Other | Attending: Family Medicine | Admitting: Pharmacist

## 2019-08-19 DIAGNOSIS — Z23 Encounter for immunization: Secondary | ICD-10-CM

## 2019-08-19 NOTE — Progress Notes (Signed)
Patient presents for vaccination against influenza per orders of Dr. Newlin. Consent given. Counseling provided. No contraindications exists. Vaccine administered without incident.   

## 2019-10-08 ENCOUNTER — Encounter: Payer: Self-pay | Admitting: Internal Medicine

## 2019-10-08 ENCOUNTER — Other Ambulatory Visit: Payer: Self-pay

## 2019-10-08 ENCOUNTER — Ambulatory Visit: Payer: Medicaid Other | Attending: Internal Medicine | Admitting: Internal Medicine

## 2019-10-08 VITALS — BP 128/82 | HR 78 | Temp 98.4°F | Resp 16 | Wt 267.0 lb

## 2019-10-08 DIAGNOSIS — R42 Dizziness and giddiness: Secondary | ICD-10-CM | POA: Diagnosis not present

## 2019-10-08 DIAGNOSIS — M25512 Pain in left shoulder: Secondary | ICD-10-CM | POA: Diagnosis not present

## 2019-10-08 NOTE — Patient Instructions (Signed)
Continue taking your blood pressure medications.  I am not concerned about your shoulder pain or the intermittent dizziness. If these symptoms worsen, please call back.

## 2019-10-08 NOTE — Progress Notes (Signed)
Pt states she pulled a muscle in her left shoulder while getting a bag of ice   [facility] notes- 3 complaints 1) occasionally when she is supine she will note a slight head spinning. These sxs resolve quickly. She has noted this intermittently for the past two months. She has a hx of stroke and is concerned.She has no other neurologic complaints. No change in hearing. No associated headache  2) when she was reaching for a bag of rice 2 weeks ago she had a sharp pain in left posterior shoulder. The bag weighed approx 20 pounds. Pain has gotten some better. She used some heat locally. No associated sxs and the pain is resolving  3- she describes a "red bump" above her right eye. 2 weeks ago. Has resolved.   Past Medical History:  Diagnosis Date  . Chronic headaches    daily, bitemporal, associated with tingling in face  . Heart murmur   . Hypertension   . 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 Stroke [facility]) 07/05/2013    Social History   Socioeconomic History  . Marital status: Divorced    Spouse name: Not on file  . Number of children: 3  . Years of education: college  . Highest education level: Not on file  Occupational History  . Occupation: [employer]  Social Needs  . Financial resource strain: Not on file  . Food insecurity    Worry: Not on file    Inability: Not on file  . Transportation needs    Medical: Not on file    Non-medical: Not on file  Tobacco Use  . Smoking status: Never Smoker  . Smokeless tobacco: Never Used  Substance and Sexual Activity  . Alcohol use: No  . Drug use: No  . Sexual activity: Not on file  Lifestyle  . Physical activity    Days per week: Not on file    Minutes per session: Not on file  . Stress: Not on file  Relationships  . Social Herbalist on phone: Not on file    Gets together: Not on file    Attends religious service: Not on file   Active member of club or organization: Not on file    Attends meetings of clubs or organizations: Not on file    Relationship status: Not on file  . Intimate partner violence    Fear of current or ex partner: Not on file    Emotionally abused: Not on file    Physically abused: Not on file    Forced sexual activity: Not on file  Other Topics Concern  . Not on file  Social History Narrative  . Not on file    Past Surgical History:  Procedure Laterality Date  . ABDOMINAL HYSTERECTOMY N/A 12/15/2015   Procedure: HYSTERECTOMY ABDOMINAL;  Surgeon: Emily Filbert, MD;  Location: [facility];  Service: Gynecology;  Laterality: N/A;  . CESAREAN SECTION    . ceserean section    . COLONOSCOPY    . POLYPECTOMY    . SALPINGOOPHORECTOMY Bilateral 12/15/2015   Procedure: SALPINGO OOPHORECTOMY;  Surgeon: Emily Filbert, MD;  Location: [facility];  Service: Gynecology;  Laterality: Bilateral;  . VENTRICULOSTOMY Left 07/07/2013   Procedure: VENTRICULOSTOMY;  Surgeon: Ophelia Charter, MD;  Location: [facility];  Service: Neurosurgery;  Laterality: Left;  Left Ventriculostomy and Removal of  Right Vetriculostomy    Family History  Problem Relation Age of Onset  . Hypertension Mother   . Diabetes Mother   . Stroke Mother   . Hypertension Father   . Colon cancer Neg Hx   . Breast cancer Neg Hx   . Colon polyps Neg Hx   . Esophageal cancer Neg Hx   . Rectal cancer Neg Hx   . Stomach cancer Neg Hx     Allergies  Allergen Reactions  . Elavil [Amitriptyline] Other (See Comments)    DIZZINESS  . Topiramate Er Other (See Comments)    Suicidal Thoughts- [place]    Current Outpatient Medications on File Prior to Visit  Medication Sig Dispense Refill  . aspirin EC 81 MG tablet Take 1 tablet (81 mg total) by mouth daily. 30 tablet 1  . atorvastatin (LIPITOR) 40 MG tablet Take 1 tablet (40 mg total) by mouth daily. 90 tablet 1  . gabapentin (NEURONTIN) 300 MG capsule TAKE 2 CAPSULES (600 MG TOTAL) BY MOUTH 3  (THREE) TIMES DAILY. 540 capsule 0  . levETIRAcetam (KEPPRA XR) 500 MG 24 hr tablet Take 1 tablet (500 mg total) by mouth daily. 90 tablet 3  . lisinopril-hydrochlorothiazide (ZESTORETIC) 20-25 MG tablet Take 1 tablet by mouth daily. 90 tablet 1   No current facility-administered medications on file prior to visit.      patient denies chest pain, shortness of breath, orthopnea. Denies lower extremity edema, abdominal pain, change in appetite, change in bowel movements. Patient denies rashes, musculoskeletal complaints. No other specific complaints in a complete review of systems.   BP (!) 143/94   Pulse 78   Temp 98.4 F (36.9 C) (Oral)   Resp 16   Wt 267 lb (121.1 kg)   LMP 11/24/2011   SpO2 95%   BMI 40.60 kg/m   Well-developed well-nourished female in no acute distress. HEENT exam atraumatic, normocephalic, extraocular muscles are intact No nystagmus. No conjuctival erythema or stye. She does have a preauricular palpable lymph node on right (4 mm).  Neck is supple. No jugular venous distention no thyromegaly. Chest clear to auscultation without increased work of breathing. Cardiac exam S1 and S2 are regular. 2/6HSM.  Abdominal exam overweightactive bowel sounds, soft, nontender. Extremities no edema. Neurologic exam she is alert without any motor sensory deficits. Gait is normal.  A/P- suspect she had a stye- resolved  Presumed vertigo- discussed treatment options- sxs are mild. No treatment  Shoulder pain resolved.

## 2019-10-30 ENCOUNTER — Other Ambulatory Visit: Payer: Self-pay | Admitting: Family Medicine

## 2019-10-30 ENCOUNTER — Other Ambulatory Visit: Payer: Self-pay | Admitting: Internal Medicine

## 2019-10-30 DIAGNOSIS — I10 Essential (primary) hypertension: Secondary | ICD-10-CM

## 2019-10-30 DIAGNOSIS — R200 Anesthesia of skin: Secondary | ICD-10-CM

## 2019-12-01 ENCOUNTER — Other Ambulatory Visit: Payer: Self-pay | Admitting: Family Medicine

## 2019-12-01 DIAGNOSIS — E7800 Pure hypercholesterolemia, unspecified: Secondary | ICD-10-CM

## 2019-12-04 ENCOUNTER — Other Ambulatory Visit: Payer: Self-pay | Admitting: Family Medicine

## 2019-12-04 DIAGNOSIS — I10 Essential (primary) hypertension: Secondary | ICD-10-CM

## 2019-12-04 DIAGNOSIS — R200 Anesthesia of skin: Secondary | ICD-10-CM

## 2020-01-14 ENCOUNTER — Other Ambulatory Visit: Payer: Self-pay

## 2020-01-14 ENCOUNTER — Encounter: Payer: Self-pay | Admitting: Family Medicine

## 2020-01-14 ENCOUNTER — Ambulatory Visit: Payer: Medicaid Other | Attending: Family Medicine | Admitting: Family Medicine

## 2020-01-14 VITALS — BP 127/80 | HR 76 | Ht 68.0 in | Wt 266.0 lb

## 2020-01-14 DIAGNOSIS — Z8249 Family history of ischemic heart disease and other diseases of the circulatory system: Secondary | ICD-10-CM | POA: Diagnosis not present

## 2020-01-14 DIAGNOSIS — R7303 Prediabetes: Secondary | ICD-10-CM | POA: Insufficient documentation

## 2020-01-14 DIAGNOSIS — Z823 Family history of stroke: Secondary | ICD-10-CM | POA: Diagnosis not present

## 2020-01-14 DIAGNOSIS — E7800 Pure hypercholesterolemia, unspecified: Secondary | ICD-10-CM | POA: Insufficient documentation

## 2020-01-14 DIAGNOSIS — Z1231 Encounter for screening mammogram for malignant neoplasm of breast: Secondary | ICD-10-CM

## 2020-01-14 DIAGNOSIS — Z0000 Encounter for general adult medical examination without abnormal findings: Secondary | ICD-10-CM | POA: Diagnosis not present

## 2020-01-14 DIAGNOSIS — Z23 Encounter for immunization: Secondary | ICD-10-CM | POA: Diagnosis not present

## 2020-01-14 DIAGNOSIS — Z8673 Personal history of transient ischemic attack (TIA), and cerebral infarction without residual deficits: Secondary | ICD-10-CM | POA: Insufficient documentation

## 2020-01-14 DIAGNOSIS — R569 Unspecified convulsions: Secondary | ICD-10-CM | POA: Insufficient documentation

## 2020-01-14 DIAGNOSIS — Z888 Allergy status to other drugs, medicaments and biological substances status: Secondary | ICD-10-CM | POA: Diagnosis not present

## 2020-01-14 DIAGNOSIS — Z79899 Other long term (current) drug therapy: Secondary | ICD-10-CM | POA: Insufficient documentation

## 2020-01-14 DIAGNOSIS — Z833 Family history of diabetes mellitus: Secondary | ICD-10-CM | POA: Diagnosis not present

## 2020-01-14 DIAGNOSIS — Z90710 Acquired absence of both cervix and uterus: Secondary | ICD-10-CM | POA: Insufficient documentation

## 2020-01-14 DIAGNOSIS — I10 Essential (primary) hypertension: Secondary | ICD-10-CM | POA: Insufficient documentation

## 2020-01-14 DIAGNOSIS — Z7982 Long term (current) use of aspirin: Secondary | ICD-10-CM | POA: Insufficient documentation

## 2020-01-14 LAB — POCT GLYCOSYLATED HEMOGLOBIN (HGB A1C): HbA1c, POC (controlled diabetic range): 5.9 % (ref 0.0–7.0)

## 2020-01-14 MED ORDER — LISINOPRIL-HYDROCHLOROTHIAZIDE 20-25 MG PO TABS: 1.0000 | ORAL_TABLET | Freq: Every day | ORAL | 1 refills | Status: DC

## 2020-01-14 MED ORDER — ATORVASTATIN CALCIUM 40 MG PO TABS: 40.0000 mg | ORAL_TABLET | Freq: Every day | ORAL | 1 refills | Status: DC

## 2020-01-14 NOTE — Patient Instructions (Addendum)
Health Maintenance After Age 61 After age 61, you are at a higher risk for certain long-term diseases and infections as well as injuries from falls. Falls are a major cause of broken bones and head injuries in people who are older than age 61. Getting regular preventive care can help to keep you healthy and well. Preventive care includes getting regular testing and making lifestyle changes as recommended by your health care provider. Talk with your health care provider about:  Which screenings and tests you should have. A screening is a test that checks for a disease when you have no symptoms.  A diet and exercise plan that is right for you. What should I know about screenings and tests to prevent falls? Screening and testing are the best ways to find a health problem early. Early diagnosis and treatment give you the best chance of managing medical conditions that are common after age 61. Certain conditions and lifestyle choices may make you more likely to have a fall. Your health care provider may recommend:  Regular vision checks. Poor vision and conditions such as cataracts can make you more likely to have a fall. If you wear glasses, make sure to get your prescription updated if your vision changes.  Medicine review. Work with your health care provider to regularly review all of the medicines you are taking, including over-the-counter medicines. Ask your health care provider about any side effects that may make you more likely to have a fall. Tell your health care provider if any medicines that you take make you feel dizzy or sleepy.  Osteoporosis screening. Osteoporosis is a condition that causes the bones to get weaker. This can make the bones weak and cause them to break more easily.  Blood pressure screening. Blood pressure changes and medicines to control blood pressure can make you feel dizzy.  Strength and balance checks. Your health care provider may recommend certain tests to check your  strength and balance while standing, walking, or changing positions.  Foot health exam. Foot pain and numbness, as well as not wearing proper footwear, can make you more likely to have a fall.  Depression screening. You may be more likely to have a fall if you have a fear of falling, feel emotionally low, or feel unable to do activities that you used to do.  Alcohol use screening. Using too much alcohol can affect your balance and may make you more likely to have a fall. What actions can I take to lower my risk of falls? General instructions  Talk with your health care provider about your risks for falling. Tell your health care provider if: ? You fall. Be sure to tell your health care provider about all falls, even ones that seem minor. ? You feel dizzy, sleepy, or off-balance.  Take over-the-counter and prescription medicines only as told by your health care provider. These include any supplements.  Eat a healthy diet and maintain a healthy weight. A healthy diet includes low-fat dairy products, low-fat (lean) meats, and fiber from whole grains, beans, and lots of fruits and vegetables. Home safety  Remove any tripping hazards, such as rugs, cords, and clutter.  Install safety equipment such as grab bars in bathrooms and safety rails on stairs.  Keep rooms and walkways well-lit. Activity   Follow a regular exercise program to stay fit. This will help you maintain your balance. Ask your health care provider what types of exercise are appropriate for you.  If you need a cane or   walker, use it as recommended by your health care provider.  Wear supportive shoes that have nonskid soles. Lifestyle  Do not drink alcohol if your health care provider tells you not to drink.  If you drink alcohol, limit how much you have: ? 0-1 drink a day for women. ? 0-2 drinks a day for men.  Be aware of how much alcohol is in your drink. In the [place] one drink equals one typical bottle of beer (12  oz), one-half glass of wine (5 oz), or one shot of hard liquor (1 oz).  Do not use any products that contain nicotine or tobacco, such as cigarettes and e-cigarettes. If you need help quitting, ask your health care provider. Summary  Having a healthy lifestyle and getting preventive care can help to protect your health and wellness after age 23.  Screening and testing are the best way to find a health problem early and help you avoid having a fall. Early diagnosis and treatment give you the best chance for managing medical conditions that are more common for people who are older than age 64.  Falls are a major cause of broken bones and head injuries in people who are older than age 78. Take precautions to prevent a fall at home.  Work with your health care provider to learn what changes you can make to improve your health and wellness and to prevent falls. This information is not intended to replace advice given to you by your health care provider. Make sure you discuss any questions you have with your health care provider. Document Revised: 02/06/2019 Document Reviewed: 08/29/2017 Elsevier Patient Education  [place]. Pneumococcal Polysaccharide Vaccine (PPSV23): What You Need to Know 1. Why get vaccinated? Pneumococcal polysaccharide vaccine (PPSV23) can prevent pneumococcal disease. Pneumococcal disease refers to any illness caused by pneumococcal bacteria. These bacteria can cause many types of illnesses, including pneumonia, which is an infection of the lungs. Pneumococcal bacteria are one of the most common causes of pneumonia. Besides pneumonia, pneumococcal bacteria can also cause:  Ear infections  Sinus infections  Meningitis (infection of the tissue covering the brain and spinal cord)  Bacteremia (bloodstream infection) Anyone can get pneumococcal disease, but children under 36 years of age, people with certain medical conditions, adults 12 years or older, and  cigarette smokers are at the highest risk. Most pneumococcal infections are mild. However, some can result in long-term problems, such as brain damage or hearing loss. Meningitis, bacteremia, and pneumonia caused by pneumococcal disease can be fatal. 2. PPSV23 PPSV23 protects against 23 types of bacteria that cause pneumococcal disease. PPSV23 is recommended for:  All adults 33 years or older,  Anyone 2 years or older with certain medical conditions that can lead to an increased risk for pneumococcal disease. Most people need only one dose of PPSV23. A second dose of PPSV23, and another type of pneumococcal vaccine called PCV13, are recommended for certain high-risk groups. Your health care provider can give you more information. People 65 years or older should get a dose of PPSV23 even if they have already gotten one or more doses of the vaccine before they turned 43. 3. Talk with your health care provider Tell your vaccine provider if the person getting the vaccine:  Has had an allergic reaction after a previous dose of PPSV23, or has any severe, life-threatening allergies. In some cases, your health care provider may decide to postpone PPSV23 vaccination to a future visit. People with minor illnesses, such as a  cold, may be vaccinated. People who are moderately or severely ill should usually wait until they recover before getting PPSV23. Your health care provider can give you more information. 4. Risks of a vaccine reaction  Redness or pain where the shot is given, feeling tired, fever, or muscle aches can happen after PPSV23. People sometimes faint after medical procedures, including vaccination. Tell your provider if you feel dizzy or have vision changes or ringing in the ears. As with any medicine, there is a very remote chance of a vaccine causing a severe allergic reaction, other serious injury, or death. 5. What if there is a serious problem? An allergic reaction could occur after  the vaccinated person leaves the clinic. If you see signs of a severe allergic reaction (hives, swelling of the face and throat, difficulty breathing, a fast heartbeat, dizziness, or weakness), call 9-1-1 and get the person to the nearest hospital. For other signs that concern you, call your health care provider. Adverse reactions should be reported to the Vaccine Adverse Event Reporting System (VAERS). Your health care provider will usually file this report, or you can do it yourself. Visit the VAERS website at www.vaers.SamedayNews.es or call 340-445-0095. VAERS is only for reporting reactions, and VAERS staff do not give medical advice. 6. How can I learn more?  Ask your health care provider.  Call your local or state health department.  Contact the Centers for Disease Control and Prevention (CDC): ? Call (731)601-6476 (1-800-CDC-INFO) or ? Visit CDC's website at http://hunter.com/ CDC Vaccine Information Statement PPSV23 Vaccine (08/28/2018) This information is not intended to replace advice given to you by your health care provider. Make sure you discuss any questions you have with your health care provider. Document Revised: 02/04/2019 Document Reviewed: 05/28/2018 Elsevier Patient Education  [place].

## 2020-01-14 NOTE — Progress Notes (Signed)
Subjective:  Patient ID: Alison Ward, female    DOB: 04-12-1961  Age: 59 y.o. MRN: [id number]  CC: Annual Exam   HPI Alison Ward is a 59 y.o. female right-handed female with a medical history of hypertension, chronic headaches, previous history of right  Hemorrhagic stroke status post ventriculostomy in 06/2013, seen for an annual visit. Colonoscopy is not due until 06/2026. PAP smear not indicated as she is s/p hysterectomy. [place] and is needing refills of chronic medications.  Past Medical History:  Diagnosis Date  . Chronic headaches    daily, bitemporal, associated with tingling in face  . Heart murmur   . Hypertension   . 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 Stroke [facility]) 07/05/2013    Past Surgical History:  Procedure Laterality Date  . ABDOMINAL HYSTERECTOMY N/A 12/15/2015   Procedure: HYSTERECTOMY ABDOMINAL;  Surgeon: Emily Filbert, MD;  Location: [place] [facility];  Service: Gynecology;  Laterality: N/A;  . CESAREAN SECTION    . ceserean section    . COLONOSCOPY    . POLYPECTOMY    . SALPINGOOPHORECTOMY Bilateral 12/15/2015   Procedure: SALPINGO OOPHORECTOMY;  Surgeon: Emily Filbert, MD;  Location: [facility];  Service: Gynecology;  Laterality: Bilateral;  . VENTRICULOSTOMY Left 07/07/2013   Procedure: VENTRICULOSTOMY;  Surgeon: Ophelia Charter, MD;  Location: [place] [facility];  Service: Neurosurgery;  Laterality: Left;  Left Ventriculostomy and Removal of Right Vetriculostomy    Family History  Problem Relation Age of Onset  . Hypertension Mother   . Diabetes Mother   . Stroke Mother   . Hypertension Father   . Colon cancer Neg Hx   . Breast cancer Neg Hx   . Colon polyps Neg Hx   . Esophageal cancer Neg Hx   . Rectal cancer Neg Hx   . Stomach cancer Neg Hx     Allergies  Allergen Reactions  . Elavil [Amitriptyline] Other (See Comments)   DIZZINESS  . Topiramate Er Other (See Comments)    Suicidal Thoughts- [place]    Outpatient Medications Prior to Visit  Medication Sig Dispense Refill  . aspirin EC 81 MG tablet Take 1 tablet (81 mg total) by mouth daily. 30 tablet 1  . gabapentin (NEURONTIN) 300 MG capsule TAKE 2 CAPSULES (600 MG TOTAL) BY MOUTH 3 (THREE) TIMES DAILY. 540 capsule 0  . levETIRAcetam (KEPPRA XR) 500 MG 24 hr tablet Take 1 tablet (500 mg total) by mouth daily. 90 tablet 3  . atorvastatin (LIPITOR) 40 MG tablet TAKE 1 TABLET BY MOUTH EVERY DAY 90 tablet 1  . lisinopril-hydrochlorothiazide (ZESTORETIC) 20-25 MG tablet TAKE 1 TABLET BY MOUTH EVERY DAY 90 tablet 0   No facility-administered medications prior to visit.     ROS Review of Systems  Constitutional: Negative for activity change, appetite change and fatigue.  HENT: Negative for congestion, sinus pressure and sore throat.   Eyes: Negative for visual disturbance.  Respiratory: Negative for cough, chest tightness, shortness of breath and wheezing.   Cardiovascular: Negative for chest pain and palpitations.  Gastrointestinal: Negative for abdominal distention, abdominal pain and constipation.  Endocrine: Negative for polydipsia.  Genitourinary: Negative for dysuria and frequency.  Musculoskeletal: Negative for arthralgias and back pain.  Skin: Negative for rash.  Neurological: Negative for tremors, light-headedness and numbness.  Hematological: Does not bruise/bleed  easily.  Psychiatric/Behavioral: Negative for agitation and behavioral problems.    Objective:  BP 127/80   Pulse 76   Ht 5' 8"  (1.727 m)   Wt 266 lb (120.7 kg)   LMP 11/24/2011   SpO2 99%   BMI 40.45 kg/m   BP/Weight 01/14/2020 10/08/2019 05/18/9469  Systolic BP 962 836 629  Diastolic BP 80 82 51  Wt. (Lbs) 266 267 260  BMI 40.45 40.6 39.53      Physical Exam Constitutional:      General: She is not in acute distress.    Appearance: She is well-developed. She is not  diaphoretic.  HENT:     Head: Normocephalic.     Right Ear: External ear normal.     Left Ear: External ear normal.     Nose: Nose normal.  Eyes:     Conjunctiva/sclera: Conjunctivae normal.     Pupils: Pupils are equal, round, and reactive to light.  Neck:     Vascular: No JVD.  Cardiovascular:     Rate and Rhythm: Normal rate and regular rhythm.     Heart sounds: Normal heart sounds. No murmur. No gallop.   Pulmonary:     Effort: Pulmonary effort is normal. No respiratory distress.     Breath sounds: Normal breath sounds. No wheezing or rales.  Chest:     Chest wall: No tenderness.     Breasts:        Right: No mass or tenderness.        Left: No mass or tenderness.  Abdominal:     General: Bowel sounds are normal. There is no distension.     Palpations: Abdomen is soft. There is no mass.     Tenderness: There is no abdominal tenderness.  Musculoskeletal:        General: No tenderness. Normal range of motion.     Cervical back: Normal range of motion.  Skin:    General: Skin is warm and dry.  Neurological:     Mental Status: She is alert and oriented to person, place, and time.     Deep Tendon Reflexes: Reflexes are normal and symmetric.     CMP Latest Ref Rng & Units 05/06/2019 10/28/2018 03/12/2018  Glucose 65 - 99 mg/dL 92 82 84  BUN 8 - 27 mg/dL 14 12 11   Creatinine 0.57 - 1.00 mg/dL 1.03(H) 1.10(H) 0.95  Sodium 134 - 144 mmol/L 142 141 141  Potassium 3.5 - 5.2 mmol/L 3.4(L) 4.0 4.1  Chloride 96 - 106 mmol/L 99 99 99  CO2 20 - 29 mmol/L 27 27 29   Calcium 8.7 - 10.3 mg/dL 9.8 9.9 9.9  Total Protein 6.0 - 8.5 g/dL 7.3 7.2 7.1  Total Bilirubin 0.0 - 1.2 mg/dL 1.1 1.0 0.9  Alkaline Phos 39 - 117 IU/L 67 59 64  AST 0 - 40 IU/L 24 21 17   ALT 0 - 32 IU/L 17 13 12     Lipid Panel     Component Value Date/Time   CHOL 152 05/06/2019 1136   TRIG 233 (H) 05/06/2019 1136   HDL 36 (L) 05/06/2019 1136   CHOLHDL 4.2 05/06/2019 1136   CHOLHDL 6.4 (H) 12/06/2016 1012    VLDL 35 (H) 12/06/2016 1012   LDLCALC 69 05/06/2019 1136    CBC    Component Value Date/Time   WBC 4.0 12/06/2016 1012   RBC 4.59 12/06/2016 1012   HGB 12.8 12/06/2016 1012   HCT 39.7 12/06/2016 1012   PLT 267  12/06/2016 1012   MCV 86.5 12/06/2016 1012   MCH 27.9 12/06/2016 1012   MCHC 32.2 12/06/2016 1012   RDW 16.1 (H) 12/06/2016 1012   LYMPHSABS 1,600 12/06/2016 1012   MONOABS 400 12/06/2016 1012   EOSABS 80 12/06/2016 1012   BASOSABS 40 12/06/2016 1012    Lab Results  Component Value Date   HGBA1C 5.9 01/14/2020    Assessment & Plan:  1. Annual physical exam Counseled on 150 minutes of exercise per week, healthy eating (including decreased daily intake of saturated fats, cholesterol, added sugars, sodium), STI prevention, routine healthcare maintenance.  2. Encounter for screening mammogram for malignant neoplasm of breast - MM 3D SCREEN BREAST BILATERAL; Future  3. Prediabetes - POCT glycosylated hemoglobin (Hb A1C)  4. Pure hypercholesterolemia - Lipid panel; Future - CMP14+EGFR; Future - atorvastatin (LIPITOR) 40 MG tablet; Take 1 tablet (40 mg total) by mouth daily.  Dispense: 90 tablet; Refill: 1  5. Essential hypertension, benign - lisinopril-hydrochlorothiazide (ZESTORETIC) 20-25 MG tablet; Take 1 tablet by mouth daily.  Dispense: 90 tablet; Refill: 1    Meds ordered this encounter  Medications  . atorvastatin (LIPITOR) 40 MG tablet    Sig: Take 1 tablet (40 mg total) by mouth daily.    Dispense:  90 tablet    Refill:  1  . lisinopril-hydrochlorothiazide (ZESTORETIC) 20-25 MG tablet    Sig: Take 1 tablet by mouth daily.    Dispense:  90 tablet    Refill:  1    Follow-up: Return in about 6 months (around 07/16/2020) for medical conditions.       Charlott Rakes, MD, FAAFP. [facility] and [facility] [place], [place]   01/14/2020, 3:39 PM

## 2020-01-19 ENCOUNTER — Ambulatory Visit: Payer: Medicaid Other

## 2020-01-19 ENCOUNTER — Other Ambulatory Visit: Payer: Self-pay

## 2020-01-20 ENCOUNTER — Ambulatory Visit: Payer: Medicaid Other | Attending: Family Medicine

## 2020-01-20 DIAGNOSIS — E7800 Pure hypercholesterolemia, unspecified: Secondary | ICD-10-CM | POA: Diagnosis not present

## 2020-01-21 LAB — CMP14+EGFR
ALT: 15 IU/L (ref 0–32)
AST: 23 IU/L (ref 0–40)
Albumin/Globulin Ratio: 1.6 (ref 1.2–2.2)
Albumin: 4.4 g/dL (ref 3.8–4.8)
Alkaline Phosphatase: 66 IU/L (ref 39–117)
BUN/Creatinine Ratio: 11 — ABNORMAL LOW (ref 12–28)
BUN: 11 mg/dL (ref 8–27)
Bilirubin Total: 1 mg/dL (ref 0.0–1.2)
CO2: 26 mmol/L (ref 20–29)
Calcium: 9.5 mg/dL (ref 8.7–10.3)
Chloride: 101 mmol/L (ref 96–106)
Creatinine, Ser: 0.96 mg/dL (ref 0.57–1.00)
GFR calc Af Amer: 74 mL/min/{1.73_m2} (ref >59)
GFR calc non Af Amer: 64 mL/min/{1.73_m2} (ref >59)
Globulin, Total: 2.7 g/dL (ref 1.5–4.5)
Glucose: 84 mg/dL (ref 65–99)
Potassium: 3.8 mmol/L (ref 3.5–5.2)
Sodium: 144 mmol/L (ref 134–144)
Total Protein: 7.1 g/dL (ref 6.0–8.5)

## 2020-01-21 LAB — LIPID PANEL
Chol/HDL Ratio: 3.7 ratio (ref 0.0–4.4)
Cholesterol, Total: 138 mg/dL (ref 100–199)
HDL: 37 mg/dL — ABNORMAL LOW (ref >39)
LDL Chol Calc (NIH): 71 mg/dL (ref 0–99)
Triglycerides: 174 mg/dL — ABNORMAL HIGH (ref 0–149)
VLDL Cholesterol Cal: 30 mg/dL (ref 5–40)

## 2020-02-03 ENCOUNTER — Other Ambulatory Visit: Payer: Self-pay

## 2020-02-03 ENCOUNTER — Ambulatory Visit
Admission: RE | Admit: 2020-02-03 | Discharge: 2020-02-03 | Disposition: A | Discharge status: Home / Self Care | Payer: Medicaid Other | Source: Ambulatory Visit | Attending: Family Medicine | Admitting: Family Medicine

## 2020-02-03 DIAGNOSIS — Z1231 Encounter for screening mammogram for malignant neoplasm of breast: Secondary | ICD-10-CM | POA: Diagnosis not present

## 2020-02-05 ENCOUNTER — Ambulatory Visit: Payer: Medicaid Other

## 2020-02-05 ENCOUNTER — Ambulatory Visit: Payer: Medicaid Other | Admitting: Adult Health

## 2020-02-05 ENCOUNTER — Other Ambulatory Visit: Payer: Self-pay

## 2020-02-05 ENCOUNTER — Encounter: Payer: Self-pay | Admitting: Adult Health

## 2020-02-05 VITALS — BP 134/68 | HR 74 | Temp 97.7°F | Ht 68.0 in | Wt 262.0 lb

## 2020-02-05 DIAGNOSIS — G40109 Localization-related (focal) (partial) symptomatic epilepsy and epileptic syndromes with simple partial seizures, not intractable, without status epilepticus: Secondary | ICD-10-CM | POA: Diagnosis not present

## 2020-02-05 DIAGNOSIS — I619 Nontraumatic intracerebral hemorrhage, unspecified: Secondary | ICD-10-CM | POA: Diagnosis not present

## 2020-02-05 DIAGNOSIS — G44229 Chronic tension-type headache, not intractable: Secondary | ICD-10-CM | POA: Diagnosis not present

## 2020-02-05 MED ORDER — GABAPENTIN 600 MG PO TABS: 600.0000 mg | ORAL_TABLET | Freq: Three times a day (TID) | ORAL | 3 refills | Status: DC

## 2020-02-05 MED ORDER — LEVETIRACETAM ER 500 MG PO TB24: 500.0000 mg | ORAL_TABLET | Freq: Every day | ORAL | 3 refills | Status: DC

## 2020-02-05 NOTE — Progress Notes (Signed)
[facility] [address] [place]. [place] [address] [phone]       OFFICE FOLLOW UP NOTE  [name] Date of Birth:  02-04-1961 Medical Record Number:  [id number]     CHIEF COMPLAINT:  Chief Complaint  Patient presents with  . Follow-up    rm 9, alone, tension headache, seizures, pt states she is doing well    HPI:  Today, 02/05/2020, Alison Ward is being seen for 1 year follow-up regarding history of stroke with seizure disorder and chronic headaches.  She has been stable since prior visit without reoccurring seizure episodes or new/worsening stroke/TIA symptoms.  Ongoing use of Keppra XR 500 mg daily tolerating without side effects.  Continues on aspirin 81 mg daily and atorvastatin 40 mg daily for secondary stroke prevention of side effects.  Blood pressure today 134/68.  She continues to follow with PCP for HTN and HLD management.  She remains on gabapentin 600 mg 3 times daily without any recent headaches and tolerating dosage well.  No concerns at this time.     ROS:   14 system review of systems performed and negative with exception of no complaints  PMH:  Past Medical History:  Diagnosis Date  . Chronic headaches    daily, bitemporal, associated with tingling in face  . Heart murmur   . Hypertension   . Seizures ([place])    partial seizures last one on 10/22/15  On Keppra currently - last seizure 08-2018 x 2 small issues   . Stroke ([place]) 07/05/2013    PSH:  Past Surgical History:  Procedure Laterality Date  . ABDOMINAL HYSTERECTOMY N/A 12/15/2015   Procedure: HYSTERECTOMY ABDOMINAL;  Surgeon: Emily Filbert, MD;  Location: [facility];  Service: Gynecology;  Laterality: N/A;  . CESAREAN SECTION    . ceserean section    . COLONOSCOPY    . POLYPECTOMY    . SALPINGOOPHORECTOMY Bilateral 12/15/2015   Procedure: SALPINGO OOPHORECTOMY;  Surgeon: Emily Filbert, MD;  Location: [facility];  Service: Gynecology;  Laterality: Bilateral;  .  VENTRICULOSTOMY Left 07/07/2013   Procedure: VENTRICULOSTOMY;  Surgeon: Ophelia Charter, MD;  Location: [facility];  Service: Neurosurgery;  Laterality: Left;  Left Ventriculostomy and Removal of Right Vetriculostomy    Social History:  Social History   Socioeconomic History  . Marital status: Divorced    Spouse name: Not on file  . Number of children: 3  . Years of education: college  . Highest education level: Not on file  Occupational History  . Occupation: [employer]  Tobacco Use  . Smoking status: Never Smoker  . Smokeless tobacco: Never Used  Substance and Sexual Activity  . Alcohol use: No  . Drug use: No  . Sexual activity: Not on file  Other Topics Concern  . Not on file  Social History Narrative  . Not on file   Social Determinants of Health   Financial Resource Strain:   . Difficulty of Paying Living Expenses:   Food Insecurity:   . Worried About Charity fundraiser in the Last Year:   . Arboriculturist in the Last Year:   Transportation Needs:   . Film/video editor (Medical):   Marland Kitchen Lack of Transportation (Non-Medical):   Physical Activity:   . Days of Exercise per Week:   . Minutes of Exercise per Session:   Stress:   . Feeling of Stress :   Social Connections:   . Frequency of  Communication with Friends and Family:   . Frequency of Social Gatherings with Friends and Family:   . Attends Religious Services:   . Active Member of Clubs or Organizations:   . Attends Archivist Meetings:   Marland Kitchen Marital Status:   Intimate Partner Violence:   . Fear of Current or Ex-Partner:   . Emotionally Abused:   Marland Kitchen Physically Abused:   . Sexually Abused:     Family History:  Family History  Problem Relation Age of Onset  . Hypertension Mother   . Diabetes Mother   . Stroke Mother   . Hypertension Father   . Colon cancer Neg Hx   . Breast cancer Neg Hx   . Colon polyps Neg Hx   . Esophageal cancer Neg Hx   . Rectal cancer Neg Hx   .  Stomach cancer Neg Hx     Medications:   Current Outpatient Medications on File Prior to Visit  Medication Sig Dispense Refill  . aspirin EC 81 MG tablet Take 1 tablet (81 mg total) by mouth daily. 30 tablet 1  . atorvastatin (LIPITOR) 40 MG tablet Take 1 tablet (40 mg total) by mouth daily. 90 tablet 1  . gabapentin (NEURONTIN) 300 MG capsule TAKE 2 CAPSULES (600 MG TOTAL) BY MOUTH 3 (THREE) TIMES DAILY. 540 capsule 0  . levETIRAcetam (KEPPRA XR) 500 MG 24 hr tablet Take 1 tablet (500 mg total) by mouth daily. 90 tablet 3  . lisinopril-hydrochlorothiazide (ZESTORETIC) 20-25 MG tablet Take 1 tablet by mouth daily. 90 tablet 1   No current facility-administered medications on file prior to visit.    Allergies:   Allergies  Allergen Reactions  . Elavil [Amitriptyline] Other (See Comments)    DIZZINESS  . Topiramate Er Other (See Comments)    Suicidal Thoughts- Trokendi    OBJECTIVE:  Today's Vitals   02/05/20 1029  BP: 134/68  Pulse: 74  Temp: 97.7 F (36.5 C)  Weight: 262 lb (118.8 kg)  Height: 5\' 8"  (1.727 m)   Body mass index is 39.84 kg/m.   Physical Exam  General: well developed, well nourished, pleasant middle aged [place] female, seated, in no evident distress Head: head normocephalic and atraumatic.   Neck: supple with no carotid or supraclavicular bruits Cardiovascular: regular rate and rhythm, no murmurs Musculoskeletal: no deformity Skin:  no rash/petichiae Vascular:  Normal pulses all extremities   Neurologic Exam Mental Status: Awake and fully alert. Normal speech and language. Oriented to place and time. Recent and remote memory intact. Attention span, concentration and fund of knowledge appropriate. Mood and affect appropriate.  Cranial Nerves: Pupils equal, briskly reactive to light. Extraocular movements full without nystagmus. Visual fields full to confrontation. Hearing intact. Facial sensation intact. Face, tongue, palate moves normally and  symmetrically.  Motor: Normal bulk and tone. Normal strength in all tested extremity muscles. Sensory.: intact to touch , pinprick , position and vibratory sensation.  Coordination: Rapid alternating movements normal in all extremities. Finger-to-nose and heel-to-shin performed accurately bilaterally. Gait and Station: Arises from chair without difficulty. Stance is normal. Gait demonstrates normal stride length and balance Reflexes: 1+ and symmetric. Toes downgoing.       ASSESSMENT: Alison Ward is a 59 y.o. 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  Remote history of hypertensive right temporal intracerebral hemorrhage in 2014.  PLAN:  Continue Keppra 500mg  XR daily for seizure prophylaxis -refill placed Continue gabapentin 600 mg 3 times daily for headache prophylaxis -refill placed Continue aspirin 81 mg and atorvastatin 40 mg for secondary stroke prevention Continue to follow with PCP for chronic disease management   Recommend a follow-up in 1 year or call earlier if needed    I spent 22 minutes of face-to-face and non-face-to-face time with patient.  This included previsit chart review, lab review, study review, order entry, electronic health record documentation, patient education regarding stroke history, seizure history, ongoing management of chronic tension type headaches, importance of managing stroke risk factors and answered all questions to patient satisfaction    Frann Rider, AGNP-BC  [facility] [address] [place],  [address]  Phone [phone] Fax [phone] Note: This document was prepared with digital dictation and possible smart phrase technology. Any transcriptional errors that result from this process are  unintentional.

## 2020-02-05 NOTE — Progress Notes (Signed)
I agree with the above plan 

## 2020-02-05 NOTE — Patient Instructions (Addendum)
Your Plan:  Continue current medications with keppra and gabapentin - refills will be placed  Continue ongoing management of stroke risk factors with PCP   Follow up in 1 year or call earlier if needed      Thank you for coming to see [place] at [facility]. I hope we have been able to provide you high quality care today.  You may receive a patient satisfaction survey over the next few weeks. We would appreciate your feedback and comments so that we may continue to improve ourselves and the health of our patients.

## 2020-02-07 ENCOUNTER — Other Ambulatory Visit: Payer: Self-pay

## 2020-02-07 ENCOUNTER — Ambulatory Visit: Payer: Medicaid Other | Attending: Internal Medicine

## 2020-02-07 DIAGNOSIS — Z23 Encounter for immunization: Secondary | ICD-10-CM

## 2020-02-07 NOTE — Progress Notes (Signed)
   Covid-19 Vaccination Clinic  Name:  Alison Ward    MRN: [id number] DOB: [birth year]  02/07/2020  Alison Ward was observed post Covid-19 immunization for 15 minutes without incident. She was provided with Vaccine Information Sheet and instruction to access the V-Safe system.   Alison Ward was instructed to call 911 with any severe reactions post vaccine: Marland Kitchen Difficulty breathing  . Swelling of face and throat  . A fast heartbeat  . A bad rash all over body  . Dizziness and weakness   Immunizations Administered    Name Date Dose VIS Date Route   Pfizer COVID-19 Vaccine 02/07/2020 10:39 AM 0.3 mL 10/10/2019 Intramuscular   Manufacturer: [place]   Lot: [id number]   [place]: [id number]

## 2020-02-28 ENCOUNTER — Ambulatory Visit: Payer: Medicaid Other | Attending: Internal Medicine

## 2020-02-28 DIAGNOSIS — Z23 Encounter for immunization: Secondary | ICD-10-CM

## 2020-02-28 NOTE — Progress Notes (Signed)
   Covid-19 Vaccination Clinic  Name:  Alison Ward    MRN: [id number] DOB: 1961/01/02  02/28/2020  [name] was observed post Covid-19 immunization for 15 minutes without incident. She was provided with Vaccine Information Sheet and instruction to access the V-Safe system.   [name] was instructed to call 911 with any severe reactions post vaccine: Marland Kitchen Difficulty breathing  . Swelling of face and throat  . A fast heartbeat  . A bad rash all over body  . Dizziness and weakness   Immunizations Administered    Name Date Dose VIS Date Route   Pfizer COVID-19 Vaccine 02/28/2020  8:10 AM 0.3 mL 12/24/2018 Intramuscular   Manufacturer: [place]   Lot: [id number]   [place]: [id number]

## 2020-03-01 ENCOUNTER — Ambulatory Visit: Payer: Medicaid Other

## 2020-03-09 DIAGNOSIS — Z20828 Contact with and (suspected) exposure to other viral communicable diseases: Secondary | ICD-10-CM | POA: Diagnosis not present

## 2020-03-09 DIAGNOSIS — Z03818 Encounter for observation for suspected exposure to other biological agents ruled out: Secondary | ICD-10-CM | POA: Diagnosis not present

## 2020-07-03 IMAGING — MG DIGITAL SCREENING BILAT W/ TOMO W/ CAD
6 of 10 series · 6 of 30 positions shown · non-contrast
Comparison: Previous exam(s).

CLINICAL DATA: Screening.

EXAM:
DIGITAL SCREENING BILATERAL MAMMOGRAM WITH TOMO AND CAD

[L MLO synth-2D (1 of 2)]
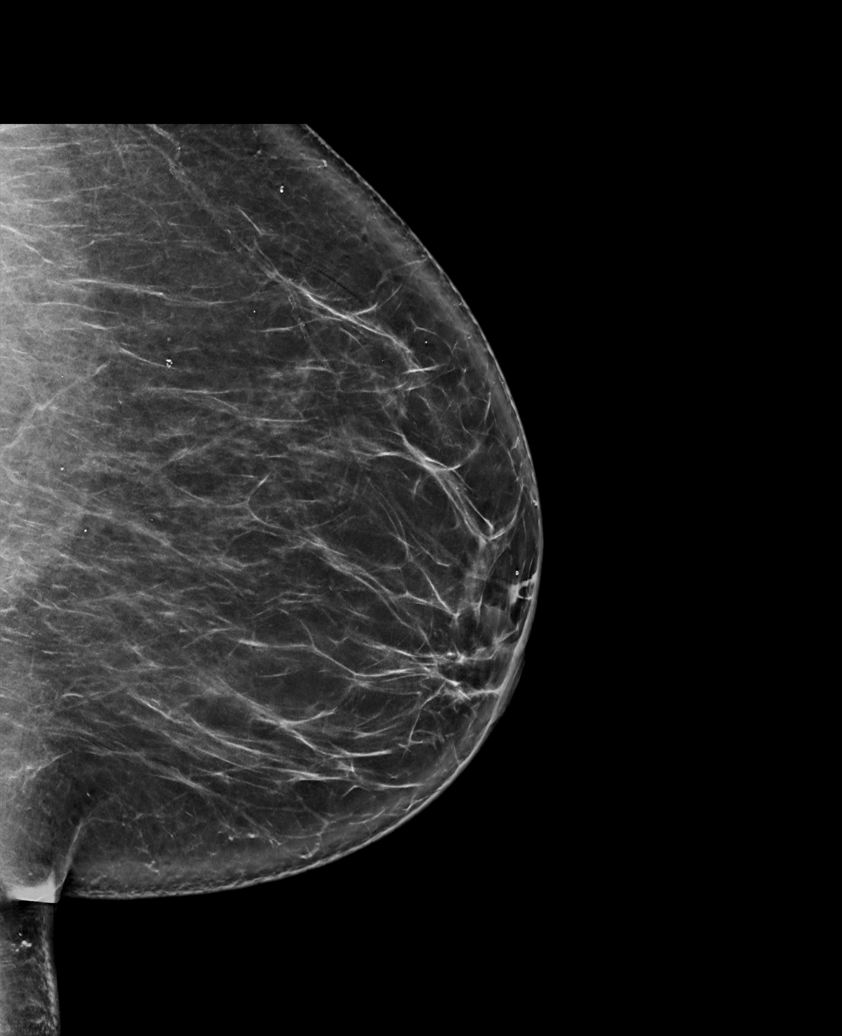

[L CC synth-2D]
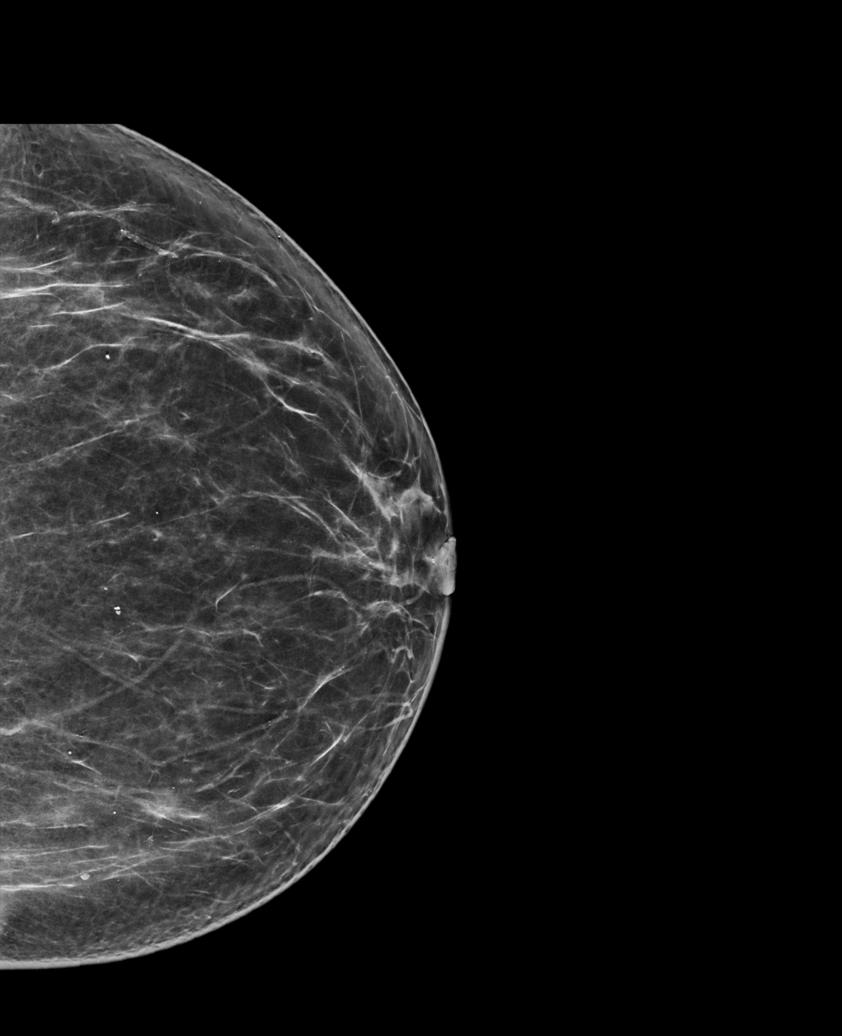

[R MLO synth-2D]
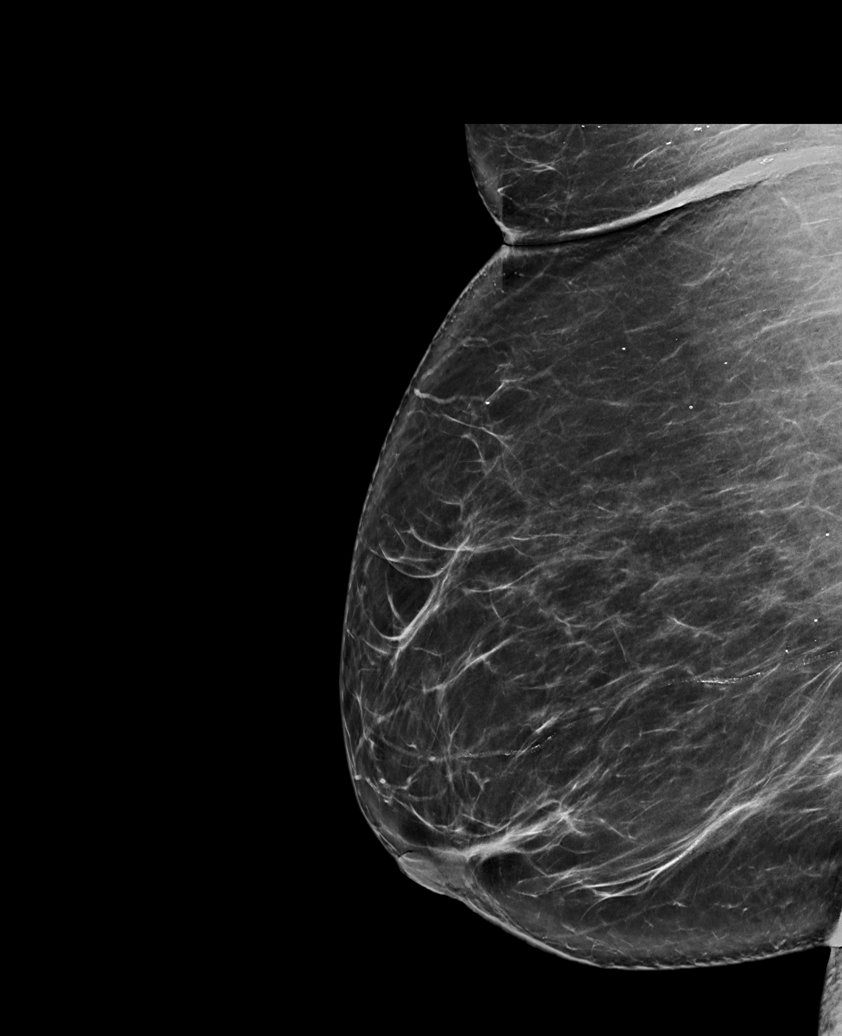

[L MLO synth-2D (2 of 2)]
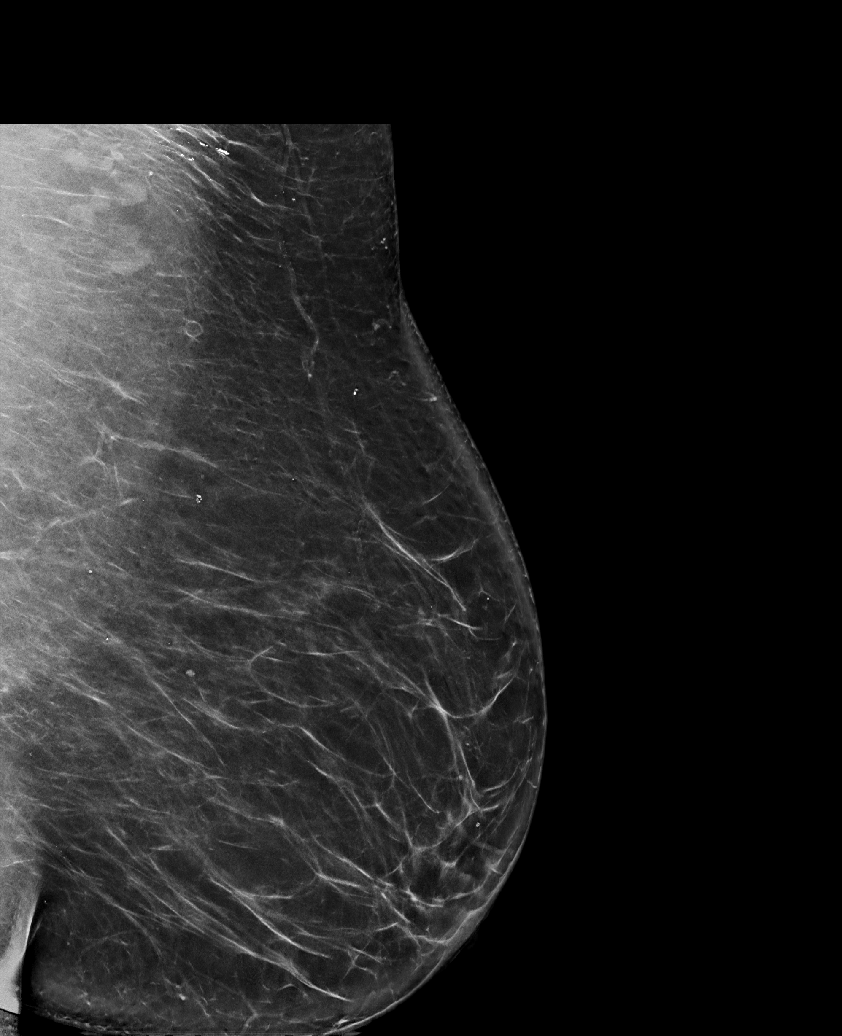

[R CC synth-2D]
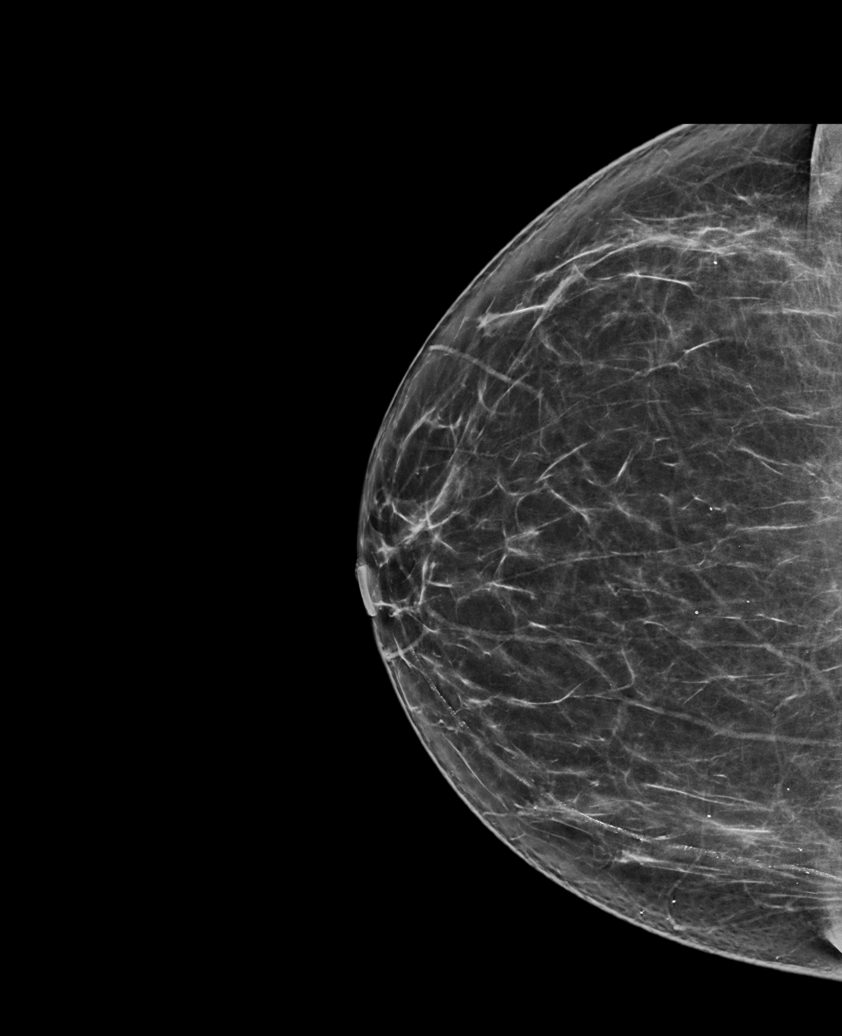

[R MLO tomo · tomo slice 45/90.0]
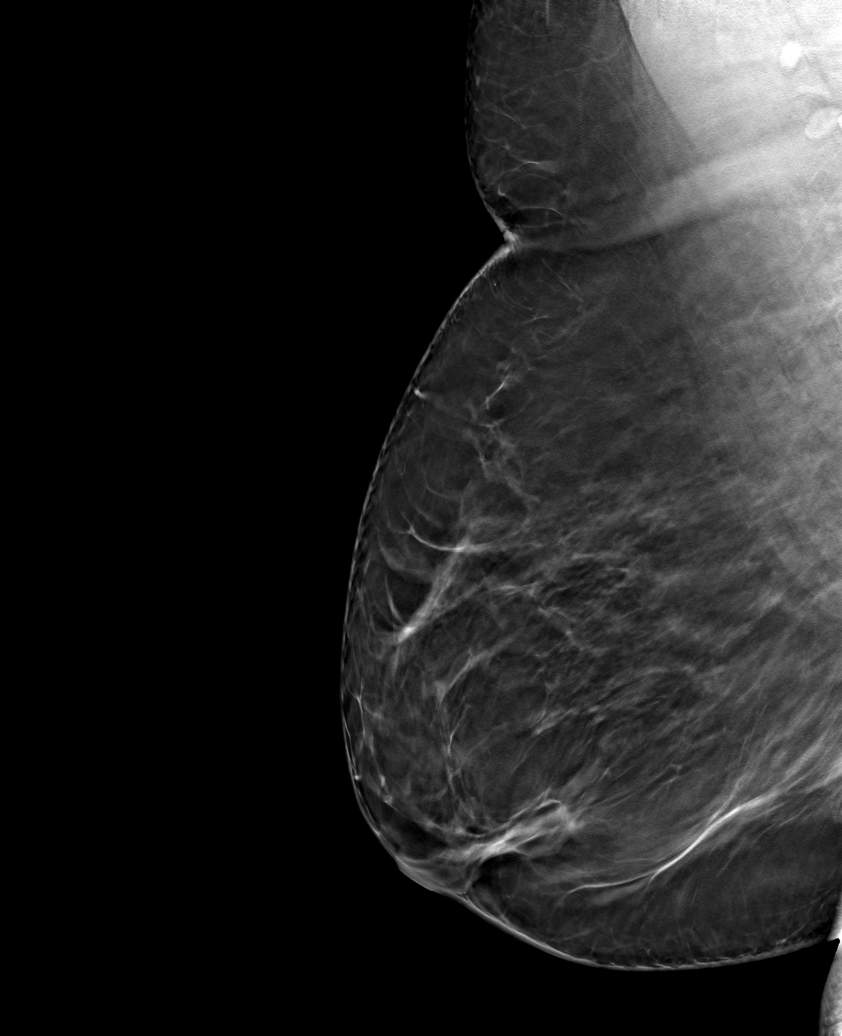

[6 of 30 positions shown; findings below may reference images not displayed]

ACR Breast Density Category b: There are scattered areas of
fibroglandular density.
FINDINGS: There are no findings suspicious for malignancy. Images were
processed with CAD.
IMPRESSION: No mammographic evidence of malignancy. A result letter of this
screening mammogram will be mailed directly to the patient.

RECOMMENDATION:
Screening mammogram in one year. (Code:CN-U-775)

BI-RADS CATEGORY  1: Negative.

## 2021-02-08 ENCOUNTER — Ambulatory Visit: Payer: Medicaid Other | Admitting: Adult Health

## 2021-02-08 ENCOUNTER — Encounter: Payer: Self-pay | Admitting: Adult Health

## 2021-02-08 NOTE — Progress Notes (Deleted)
[facility] [address] [place]. [place] [address] [phone]       OFFICE FOLLOW UP NOTE  [name] Date of Birth:  21-Feb-1961 Medical Record Number:  [id number]     CHIEF COMPLAINT:  No chief complaint on file.   HPI:  Today, 02/08/2021, [name] is being seen for 1 year follow-up regarding history of stroke with seizure disorder and chronic headaches.     Stable without reoccurring seizure episodes since prior visit on Keppra XR 500 mg daily without side effects  Compliant on aspirin and atorvastatin for stroke prevention tolerating without new stroke/TIA symptoms.  Blood pressure today ***  Headaches well managed on gabapentin 600 mg 3 times daily    or new/worsening stroke/TIA symptoms.  Ongoing use of Keppra XR 500 mg daily tolerating without side effects.  Continues on aspirin 81 mg daily and atorvastatin 40 mg daily for secondary stroke prevention of side effects.  Blood pressure today 134/68.  She continues to follow with PCP for HTN and HLD management.  She remains on gabapentin 600 mg 3 times daily without any recent headaches and tolerating dosage well.  No concerns at this time.     ROS:   14 system review of systems performed and negative with exception of no complaints  PMH:  Past Medical History:  Diagnosis Date  . Chronic headaches    daily, bitemporal, associated with tingling in face  . Heart murmur   . Hypertension   . Seizures ([place])    partial seizures last one on 10/22/15  On Keppra currently - last seizure 08-2018 x 2 small issues   . Stroke ([place]) 07/05/2013    PSH:  Past Surgical History:  Procedure Laterality Date  . ABDOMINAL HYSTERECTOMY N/A 12/15/2015   Procedure: HYSTERECTOMY ABDOMINAL;  Surgeon: Emily Filbert, MD;  Location: [facility];  Service: Gynecology;  Laterality: N/A;  . CESAREAN SECTION    . ceserean section    . COLONOSCOPY    . POLYPECTOMY    . SALPINGOOPHORECTOMY Bilateral 12/15/2015    Procedure: SALPINGO OOPHORECTOMY;  Surgeon: Emily Filbert, MD;  Location: [facility];  Service: Gynecology;  Laterality: Bilateral;  . VENTRICULOSTOMY Left 07/07/2013   Procedure: VENTRICULOSTOMY;  Surgeon: Ophelia Charter, MD;  Location: [facility];  Service: Neurosurgery;  Laterality: Left;  Left Ventriculostomy and Removal of Right Vetriculostomy    Social History:  Social History   Socioeconomic History  . Marital status: Divorced    Spouse name: Not on file  . Number of children: 3  . Years of education: college  . Highest education level: Not on file  Occupational History  . Occupation: [employer]  Tobacco Use  . Smoking status: Never Smoker  . Smokeless tobacco: Never Used  Vaping Use  . Vaping Use: Never used  Substance and Sexual Activity  . Alcohol use: No  . Drug use: No  . Sexual activity: Not on file  Other Topics Concern  . Not on file  Social History Narrative  . Not on file   Social Determinants of Health   Financial Resource Strain: Not on file  Food Insecurity: Not on file  Transportation Needs: Not on file  Physical Activity: Not on file  Stress: Not on file  Social Connections: Not on file  Intimate Partner Violence: Not on file    Family History:  Family History  Problem Relation Age of Onset  . Hypertension Mother   . Diabetes Mother   .  Stroke Mother   . Hypertension Father   . Colon cancer Neg Hx   . Breast cancer Neg Hx   . Colon polyps Neg Hx   . Esophageal cancer Neg Hx   . Rectal cancer Neg Hx   . Stomach cancer Neg Hx     Medications:   Current Outpatient Medications on File Prior to Visit  Medication Sig Dispense Refill  . aspirin EC 81 MG tablet Take 1 tablet (81 mg total) by mouth daily. 30 tablet 1  . atorvastatin (LIPITOR) 40 MG tablet Take 1 tablet (40 mg total) by mouth daily. 90 tablet 1  . gabapentin (NEURONTIN) 600 MG tablet Take 1 tablet (600 mg total) by mouth 3 (three) times daily. 270 tablet 3  .  levETIRAcetam (KEPPRA XR) 500 MG 24 hr tablet Take 1 tablet (500 mg total) by mouth daily. 90 tablet 3  . lisinopril-hydrochlorothiazide (ZESTORETIC) 20-25 MG tablet Take 1 tablet by mouth daily. 90 tablet 1   No current facility-administered medications on file prior to visit.    Allergies:   Allergies  Allergen Reactions  . Elavil [Amitriptyline] Other (See Comments)    DIZZINESS  . Topiramate Er Other (See Comments)    Suicidal Thoughts- Trokendi    OBJECTIVE:  There were no vitals filed for this visit. There is no height or weight on file to calculate BMI.   Physical Exam  General: well developed, well nourished, pleasant middle aged Alison Ward female, seated, in no evident distress Head: head normocephalic and atraumatic.   Neck: supple with no carotid or supraclavicular bruits Cardiovascular: regular rate and rhythm, no murmurs Musculoskeletal: no deformity Skin:  no rash/petichiae Vascular:  Normal pulses all extremities   Neurologic Exam Mental Status: Awake and fully alert. Normal speech and language. Oriented to place and time. Recent and remote memory intact. Attention span, concentration and fund of knowledge appropriate. Mood and affect appropriate.  Cranial Nerves: Pupils equal, briskly reactive to light. Extraocular movements full without nystagmus. Visual fields full to confrontation. Hearing intact. Facial sensation intact. Face, tongue, palate moves normally and symmetrically.  Motor: Normal bulk and tone. Normal strength in all tested extremity muscles. Sensory.: intact to touch , pinprick , position and vibratory sensation.  Coordination: Rapid alternating movements normal in all extremities. Finger-to-nose and heel-to-shin performed accurately bilaterally. Gait and Station: Arises from chair without difficulty. Stance is normal. Gait demonstrates normal stride length and balance Reflexes: 1+ and symmetric. Toes downgoing.       ASSESSMENT: Alison Ward is a 60 y.o. 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  Remote history of hypertensive right temporal intracerebral hemorrhage in 2014.     PLAN:  Continue Keppra 500mg  XR daily for seizure prophylaxis -refill placed Continue gabapentin 600 mg 3 times daily for headache prophylaxis -refill placed Continue aspirin 81 mg and atorvastatin 40 mg for secondary stroke prevention Continue to follow with PCP for chronic disease management   Recommend a follow-up in 1 year or call earlier if needed    CC:  [facility] provider: Dr. Valarie Merino, Charlane Ferretti, MD     I spent 22 minutes of face-to-face and non-face-to-face time with patient.  This included previsit chart review, lab review, study review, order entry, electronic health record documentation, patient education regarding stroke history, seizure history, ongoing management of chronic tension type headaches, importance  of managing stroke risk factors and answered all questions to patient satisfaction    Frann Rider, [facility]  [facility] [address] [place], [place] [address]  Phone [phone] Fax [phone] Note: This document was prepared with digital dictation and possible smart phrase technology. Any transcriptional errors that result from this process are unintentional.

## 2021-11-04 DIAGNOSIS — Z23 Encounter for immunization: Secondary | ICD-10-CM | POA: Diagnosis not present

## 2022-01-02 ENCOUNTER — Encounter: Payer: Self-pay | Admitting: Family Medicine

## 2022-01-02 ENCOUNTER — Other Ambulatory Visit: Payer: Self-pay

## 2022-01-02 ENCOUNTER — Ambulatory Visit: Payer: Medicaid Other | Attending: Family Medicine | Admitting: Family Medicine

## 2022-01-02 VITALS — BP 123/83 | HR 68 | Ht 68.0 in | Wt 247.0 lb

## 2022-01-02 DIAGNOSIS — R7303 Prediabetes: Secondary | ICD-10-CM | POA: Diagnosis not present

## 2022-01-02 DIAGNOSIS — G44229 Chronic tension-type headache, not intractable: Secondary | ICD-10-CM | POA: Diagnosis not present

## 2022-01-02 DIAGNOSIS — R42 Dizziness and giddiness: Secondary | ICD-10-CM | POA: Diagnosis not present

## 2022-01-02 DIAGNOSIS — I10 Essential (primary) hypertension: Secondary | ICD-10-CM | POA: Diagnosis not present

## 2022-01-02 DIAGNOSIS — G5712 Meralgia paresthetica, left lower limb: Secondary | ICD-10-CM

## 2022-01-02 DIAGNOSIS — E7800 Pure hypercholesterolemia, unspecified: Secondary | ICD-10-CM | POA: Diagnosis not present

## 2022-01-02 LAB — POCT GLYCOSYLATED HEMOGLOBIN (HGB A1C): HbA1c, POC (prediabetic range): 5.8 % (ref 5.7–6.4)

## 2022-01-02 MED ORDER — MECLIZINE HCL 25 MG PO TABS: 25.0000 mg | ORAL_TABLET | Freq: Three times a day (TID) | ORAL | 0 refills | Status: DC | PRN

## 2022-01-02 MED ORDER — LISINOPRIL-HYDROCHLOROTHIAZIDE 20-25 MG PO TABS: 1.0000 | ORAL_TABLET | Freq: Every day | ORAL | 1 refills | Status: DC

## 2022-01-02 MED ORDER — GABAPENTIN 600 MG PO TABS: 600.0000 mg | ORAL_TABLET | Freq: Three times a day (TID) | ORAL | 1 refills | Status: DC

## 2022-01-02 MED ORDER — ATORVASTATIN CALCIUM 40 MG PO TABS: 40.0000 mg | ORAL_TABLET | Freq: Every day | ORAL | 1 refills | Status: DC

## 2022-01-02 NOTE — Patient Instructions (Signed)
Vertigo Vertigo is the feeling that you or your surroundings are moving when they are not. This feeling can come and go at any time. Vertigo often goes away on its own. Vertigo can be dangerous if it occurs while you are doing something thatcould endanger yourself or others, such as driving or operating machinery. Your health care provider will do tests to try to determine the cause of your vertigo. Tests will also help your health care provider decide how best totreat your condition. Follow these instructions at home: Eating and drinking     Dehydration can make vertigo worse. Drink enough fluid to keep your urine pale yellow. Do not drink alcohol. Activity Return to your normal activities as told by your health care provider. Ask your health care provider what activities are safe for you. In the morning, first sit up on the side of the bed. When you feel okay, stand slowly while you hold onto something until you know that your balance is fine. Move slowly. Avoid sudden body or head movements or certain positions, as told by your health care provider. If you have trouble walking or keeping your balance, try using a cane for stability. If you feel dizzy or unstable, sit down right away. Avoid doing any tasks that would cause danger to you or others if vertigo occurs. Avoid bending down if you feel dizzy. Place items in your home so that they are easy for you to reach without bending or leaning over. Do not drive or use machinery if you feel dizzy. General instructions Take over-the-counter and prescription medicines only as told by your health care provider. Keep all follow-up visits. This is important. Contact a health care provider if: Your medicines do not relieve your vertigo or they make it worse. Your condition gets worse or you develop new symptoms. You have a fever. You develop nausea or vomiting, or if nausea gets worse. Your family or friends notice any behavioral changes. You  have numbness or a prickling and tingling sensation in part of your body. Get help right away if you: Are always dizzy or you faint. Develop severe headaches. Develop a stiff neck. Develop sensitivity to light. Have difficulty moving or speaking. Have weakness in your hands, arms, or legs. Have changes in your hearing or vision. These symptoms may represent a serious problem that is an emergency. Do not wait to see if the symptoms will go away. Get medical help right away. Call your local emergency services (911 in the [place] Do not drive yourself to the hospital. Summary Vertigo is the feeling that you or your surroundings are moving when they are not. Your health care provider will do tests to try to determine the cause of your vertigo. Follow instructions for home care. You may be told to avoid certain tasks, positions, or movements. Contact a health care provider if your medicines do not relieve your symptoms, or if you have a fever, nausea, vomiting, or changes in behavior. Get help right away if you have severe headaches or difficulty speaking, or you develop hearing or vision problems. This information is not intended to replace advice given to you by your health care provider. Make sure you discuss any questions you have with your healthcare provider. Document Revised: 09/15/2020 Document Reviewed: 09/15/2020 Elsevier Patient Education  2022 [employer]  

## 2022-01-02 NOTE — Progress Notes (Signed)
Subjective:  Patient ID: Alison Ward, female    DOB: [birth year]  Age: 61 y.o. MRN: [id number]  CC: Hypertension   HPI Alison Ward is a 61 y.o. year old female with a history of hypertension, chronic headaches, previous history of right  Hemorrhagic stroke status post ventriculostomy in 06/2013, Prediabetes (A1c 5.9). She was last seen in the clinic 2 years ago as she traveled out of the country and recently came back.  Interval History: Endorses being stressed due to caring for her sick mother in [place] and she just returned last week.  She was sleep deprived and severely stressed as she was providing total care for her mom along with her sisters. She noticed she has had some headaches again but not as intense and she can feel it radiate down her face.  She also noticed some dizziness when she turns quickly or changes positions quickly.  Ever since she returned to [place] last week her sleep has improved. She is now sleeping 7-8 hours compared to about 2 to 3 hours previously.. Her gabapentin has been effective  She has had no recent seizure. Endorses compliance with her medications. Past Medical History:  Diagnosis Date   Chronic headaches    daily, bitemporal, associated with tingling in face   Heart murmur    Hypertension    Seizures ([place])    partial seizures last one on 10/22/15  On Keppra currently - last seizure 08-2018 x 2 small issues    Stroke ([place]) 07/05/2013    Past Surgical History:  Procedure Laterality Date   ABDOMINAL HYSTERECTOMY N/A 12/15/2015   Procedure: HYSTERECTOMY ABDOMINAL;  Surgeon: Emily Filbert, MD;  Location: [facility];  Service: Gynecology;  Laterality: N/A;   CESAREAN SECTION     ceserean section     COLONOSCOPY     POLYPECTOMY     SALPINGOOPHORECTOMY Bilateral 12/15/2015   Procedure: SALPINGO OOPHORECTOMY;  Surgeon: Emily Filbert, MD;  Location: [facility];  Service: Gynecology;  Laterality: Bilateral;   VENTRICULOSTOMY Left  07/07/2013   Procedure: VENTRICULOSTOMY;  Surgeon: Ophelia Charter, MD;  Location: [facility];  Service: Neurosurgery;  Laterality: Left;  Left Ventriculostomy and Removal of Right Vetriculostomy    Family History  Problem Relation Age of Onset   Hypertension Mother    Diabetes Mother    Stroke Mother    Hypertension Father    Colon cancer Neg Hx    Breast cancer Neg Hx    Colon polyps Neg Hx    Esophageal cancer Neg Hx    Rectal cancer Neg Hx    Stomach cancer Neg Hx     Allergies  Allergen Reactions   Elavil [Amitriptyline] Other (See Comments)    DIZZINESS   Topiramate Er Other (See Comments)    Suicidal Thoughts- Trokendi    Outpatient Medications Prior to Visit  Medication Sig Dispense Refill   aspirin EC 81 MG tablet Take 1 tablet (81 mg total) by mouth daily. 30 tablet 1   levETIRAcetam (KEPPRA XR) 500 MG 24 hr tablet Take 1 tablet (500 mg total) by mouth daily. 90 tablet 3   atorvastatin (LIPITOR) 40 MG tablet Take 1 tablet (40 mg total) by mouth daily. 90 tablet 1   gabapentin (NEURONTIN) 600 MG tablet Take 1 tablet (600 mg total) by mouth 3 (three) times daily. 270 tablet 3   lisinopril-hydrochlorothiazide (ZESTORETIC) 20-25 MG tablet Take 1 tablet by mouth daily. 90 tablet 1   No facility-administered medications  prior to visit.     ROS Review of Systems  Constitutional:  Negative for activity change, appetite change and fatigue.  HENT:  Negative for congestion, sinus pressure and sore throat.   Eyes:  Negative for visual disturbance.  Respiratory:  Negative for cough, chest tightness, shortness of breath and wheezing.   Cardiovascular:  Negative for chest pain and palpitations.  Gastrointestinal:  Negative for abdominal distention, abdominal pain and constipation.  Endocrine: Negative for polydipsia.  Genitourinary:  Negative for dysuria and frequency.  Musculoskeletal:  Negative for arthralgias and back pain.  Skin:  Negative for rash.  Neurological:   Positive for dizziness and headaches. Negative for tremors, light-headedness and numbness.  Hematological:  Does not bruise/bleed easily.  Psychiatric/Behavioral:  Negative for agitation and behavioral problems.    Objective:  BP 123/83    Pulse 68    Ht 5' 8"  (1.727 m)    Wt 247 lb (112 kg)    LMP 11/24/2011    SpO2 98%    BMI 37.56 kg/m   BP/Weight 01/02/2022 02/05/2020 12/13/863  Systolic BP 784 696 295  Diastolic BP 83 68 80  Wt. (Lbs) 247 262 266  BMI 37.56 39.84 40.45      Physical Exam Constitutional:      Appearance: She is well-developed.  Cardiovascular:     Rate and Rhythm: Normal rate.     Heart sounds: Normal heart sounds. No murmur heard. Pulmonary:     Effort: Pulmonary effort is normal.     Breath sounds: Normal breath sounds. No wheezing or rales.  Chest:     Chest wall: No tenderness.  Abdominal:     General: Bowel sounds are normal. There is no distension.     Palpations: Abdomen is soft. There is no mass.     Tenderness: There is no abdominal tenderness.  Musculoskeletal:        General: Normal range of motion.     Right lower leg: No edema.     Left lower leg: No edema.  Neurological:     Mental Status: She is alert and oriented to person, place, and time.     Motor: No weakness.     Coordination: Coordination normal.     Gait: Gait normal.  Psychiatric:        Mood and Affect: Mood normal.    CMP Latest Ref Rng & Units 01/20/2020 05/06/2019 10/28/2018  Glucose 65 - 99 mg/dL 84 92 82  BUN 8 - 27 mg/dL 11 14 12   Creatinine 0.57 - 1.00 mg/dL 0.96 1.03(H) 1.10(H)  Sodium 134 - 144 mmol/L 144 142 141  Potassium 3.5 - 5.2 mmol/L 3.8 3.4(L) 4.0  Chloride 96 - 106 mmol/L 101 99 99  CO2 20 - 29 mmol/L 26 27 27   Calcium 8.7 - 10.3 mg/dL 9.5 9.8 9.9  Total Protein 6.0 - 8.5 g/dL 7.1 7.3 7.2  Total Bilirubin 0.0 - 1.2 mg/dL 1.0 1.1 1.0  Alkaline Phos 39 - 117 IU/L 66 67 59  AST 0 - 40 IU/L 23 24 21   ALT 0 - 32 IU/L 15 17 13     Lipid Panel      Component Value Date/Time   CHOL 138 01/20/2020 0841   TRIG 174 (H) 01/20/2020 0841   HDL 37 (L) 01/20/2020 0841   CHOLHDL 3.7 01/20/2020 0841   CHOLHDL 6.4 (H) 12/06/2016 1012   VLDL 35 (H) 12/06/2016 1012   LDLCALC 71 01/20/2020 0841    CBC  Component Value Date/Time   WBC 4.0 12/06/2016 1012   RBC 4.59 12/06/2016 1012   HGB 12.8 12/06/2016 1012   HCT 39.7 12/06/2016 1012   PLT 267 12/06/2016 1012   MCV 86.5 12/06/2016 1012   MCH 27.9 12/06/2016 1012   MCHC 32.2 12/06/2016 1012   RDW 16.1 (H) 12/06/2016 1012   LYMPHSABS 1,600 12/06/2016 1012   MONOABS 400 12/06/2016 1012   EOSABS 80 12/06/2016 1012   BASOSABS 40 12/06/2016 1012    Lab Results  Component Value Date   HGBA1C 5.8 01/02/2022    Assessment & Plan:  1. Prediabetes Controlled with A1c of 5.8 Continue lifestyle modifications to prevent progression to type 2 diabetes mellitus - POCT glycosylated hemoglobin (Hb A1C)  2. Pure hypercholesterolemia We will check lipid panel - CMP14+EGFR - atorvastatin (LIPITOR) 40 MG tablet; Take 1 tablet (40 mg total) by mouth daily.  Dispense: 90 tablet; Refill: 1 - LP+Non-HDL Cholesterol; Future  3. Essential hypertension, benign Controlled Counseled on blood pressure goal of less than 130/80, low-sodium, DASH diet, medication compliance, 150 minutes of moderate intensity exercise per week. Discussed medication compliance, adverse effects. - lisinopril-hydrochlorothiazide (ZESTORETIC) 20-25 MG tablet; Take 1 tablet by mouth daily.  Dispense: 90 tablet; Refill: 1  4. Vertigo Cerebellar signs are normal - meclizine (ANTIVERT) 25 MG tablet; Take 1 tablet (25 mg total) by mouth 3 (three) times daily as needed for dizziness.  Dispense: 30 tablet; Refill: 0  5. Chronic tension-type headache, not intractable Headaches have increased due to recent caregiver stress, sleep deprivation Advised to make Time to rest and time for self-care Since returning to [facility] her  symptoms have started to improve when she states she does not need additional medication at this time  6. Meralgia paresthetica of left side Stable - gabapentin (NEURONTIN) 600 MG tablet; Take 1 tablet (600 mg total) by mouth 3 (three) times daily.  Dispense: 270 tablet; Refill: 1    Meds ordered this encounter  Medications   atorvastatin (LIPITOR) 40 MG tablet    Sig: Take 1 tablet (40 mg total) by mouth daily.    Dispense:  90 tablet    Refill:  1   gabapentin (NEURONTIN) 600 MG tablet    Sig: Take 1 tablet (600 mg total) by mouth 3 (three) times daily.    Dispense:  270 tablet    Refill:  1   lisinopril-hydrochlorothiazide (ZESTORETIC) 20-25 MG tablet    Sig: Take 1 tablet by mouth daily.    Dispense:  90 tablet    Refill:  1   meclizine (ANTIVERT) 25 MG tablet    Sig: Take 1 tablet (25 mg total) by mouth 3 (three) times daily as needed for dizziness.    Dispense:  30 tablet    Refill:  0    Follow-up: Return in about 6 months (around 07/05/2022) for Chronic medical conditions.       Charlott Rakes, MD, FAAFP. [facility] and [facility], [place]   01/02/2022, 5:46 PM

## 2022-01-03 LAB — CMP14+EGFR
ALT: 12 IU/L (ref 0–32)
AST: 22 IU/L (ref 0–40)
Albumin/Globulin Ratio: 1.6 (ref 1.2–2.2)
Albumin: 4.5 g/dL (ref 3.8–4.8)
Alkaline Phosphatase: 67 IU/L (ref 44–121)
BUN/Creatinine Ratio: 18 (ref 12–28)
BUN: 16 mg/dL (ref 8–27)
Bilirubin Total: 0.9 mg/dL (ref 0.0–1.2)
CO2: 26 mmol/L (ref 20–29)
Calcium: 9.7 mg/dL (ref 8.7–10.3)
Chloride: 105 mmol/L (ref 96–106)
Creatinine, Ser: 0.87 mg/dL (ref 0.57–1.00)
Globulin, Total: 2.8 g/dL (ref 1.5–4.5)
Glucose: 85 mg/dL (ref 70–99)
Potassium: 4.3 mmol/L (ref 3.5–5.2)
Sodium: 148 mmol/L — ABNORMAL HIGH (ref 134–144)
Total Protein: 7.3 g/dL (ref 6.0–8.5)
eGFR: 75 mL/min/{1.73_m2} (ref >59)

## 2022-01-04 ENCOUNTER — Other Ambulatory Visit: Payer: Self-pay | Admitting: Adult Health

## 2022-01-04 DIAGNOSIS — G44229 Chronic tension-type headache, not intractable: Secondary | ICD-10-CM

## 2022-01-11 ENCOUNTER — Telehealth: Payer: Self-pay | Admitting: Adult Health

## 2022-01-11 ENCOUNTER — Other Ambulatory Visit: Payer: Self-pay

## 2022-01-11 DIAGNOSIS — G44229 Chronic tension-type headache, not intractable: Secondary | ICD-10-CM

## 2022-01-11 MED ORDER — LEVETIRACETAM ER 500 MG PO TB24: 500.0000 mg | ORAL_TABLET | Freq: Every day | ORAL | 0 refills | Status: DC

## 2022-01-11 NOTE — Telephone Encounter (Signed)
Pt request refill for levETIRAcetam (KEPPRA XR) 500 MG 24 hr tablet at [id number] ?

## 2022-01-11 NOTE — Telephone Encounter (Signed)
Approved until upcoming appt completed  ?

## 2022-01-16 NOTE — Progress Notes (Signed)
?[facility] ?[address] ?[place]. [place] [address] ?[phone] ? ?     OFFICE FOLLOW UP NOTE ? ?[name] ?Date of Birth:  Oct 21, 1961 ?Medical Record Number:  [id number]  ? ? ? ?CHIEF COMPLAINT:  ?Chief Complaint  ?Patient presents with  ? Follow-up  ?  RM 3 alone ?Pt is well, states she was in the [place] for a while and was feeling cold fluid feeling in her head when she was there. Currently having a lot of dizziness and sharp pain on L side of head.   ? ? ?HPI: ? ?Update 01/16/2022 JM: 61 year old female with history of stroke, seizure disorder and chronic headaches.  She returns today for follow-up visit and medication management.  Prior visit almost 2 years ago as she has spent a prolonged period of time in the [place] visiting family. Over the past couple months, she has been caring for her ill mother in [place] and has started feeling left sided headaches and left sided paresthesias . Was not feeling these while in [place] which she believes is because she was resting and relaxing.  She also reports dizziness and imbalance like she may pass out but thankfully has not had any loss of consciousness.  Reports compliance on Keppra XR 500 mg daily and at times will take an extra dose of keppra if feeling like she may pass out. She will check BP which has been normal at those times. She has remained on gabapentin 600 mg 3 times daily with benefit of typical headaches. Her current headaches feel different than her normal headaches.  Otherwise, denies new stroke/TIA symptoms such as visual changes, weakness, speech difficulty or altered mental status ? ? ? ?HISTORY:  ? ?Patient initially seen by Dr. Leonie Man in 2015 for right parietal temporal ICH with surrounding edema and intraventricular extension s/p right ventriculostomy catheter. Post ICH left-sided paresthesias which improved but reported intermittent left leg numbness and over time started to affect her left arm  and face. Also experiencing headaches ever since ICH. She reported recurrent episodes of near syncope since 2016 which resolved after starting Keppra. ? ? ? ? ? ? ?ROS:   ?14 system review of systems performed and negative with exception of those listed in HPI ? ?PMH:  ?Past Medical History:  ?Diagnosis Date  ? Chronic headaches   ? daily, bitemporal, associated with tingling in face  ? Heart murmur   ? Hypertension   ? Seizures ([place])   ? partial seizures last one on 10/22/15  On Keppra currently - last seizure 08-2018 x 2 small issues   ? Stroke [facility]) 07/05/2013  ? ? ?PSH:  ?Past Surgical History:  ?Procedure Laterality Date  ? ABDOMINAL HYSTERECTOMY N/A 12/15/2015  ? Procedure: HYSTERECTOMY ABDOMINAL;  Surgeon: Emily Filbert, MD;  Location: [facility];  Service: Gynecology;  Laterality: N/A;  ? CESAREAN SECTION    ? ceserean section    ? COLONOSCOPY    ? POLYPECTOMY    ? SALPINGOOPHORECTOMY Bilateral 12/15/2015  ? Procedure: SALPINGO OOPHORECTOMY;  Surgeon: Emily Filbert, MD;  Location: [facility];  Service: Gynecology;  Laterality: Bilateral;  ? VENTRICULOSTOMY Left 07/07/2013  ? Procedure: VENTRICULOSTOMY;  Surgeon: Ophelia Charter, MD;  Location: [facility];  Service: Neurosurgery;  Laterality: Left;  Left Ventriculostomy and Removal of Right Vetriculostomy  ? ? ?Social History:  ?Social History  ? ?Socioeconomic History  ? Marital status: Divorced  ?  Spouse name: Not on file  ? Number  of children: 3  ? Years of education: college  ? Highest education level: Not on file  ?Occupational History  ? Occupation: [employer]  ?Tobacco Use  ? Smoking status: Never  ? Smokeless tobacco: Never  ?Vaping Use  ? Vaping Use: Never used  ?Substance and Sexual Activity  ? Alcohol use: No  ? Drug use: No  ? Sexual activity: Not on file  ?Other Topics Concern  ? Not on file  ?Social History Narrative  ? Not on file  ? ?Social Determinants of Health  ? ?Financial Resource Strain: Not on file  ?Food Insecurity: Not on file   ?Transportation Needs: Not on file  ?Physical Activity: Not on file  ?Stress: Not on file  ?Social Connections: Not on file  ?Intimate Partner Violence: Not on file  ? ? ?Family History:  ?Family History  ?Problem Relation Age of Onset  ? Hypertension Mother   ? Diabetes Mother   ? Stroke Mother   ? Hypertension Father   ? Colon cancer Neg Hx   ? Breast cancer Neg Hx   ? Colon polyps Neg Hx   ? Esophageal cancer Neg Hx   ? Rectal cancer Neg Hx   ? Stomach cancer Neg Hx   ? ? ?Medications:   ?Current Outpatient Medications on File Prior to Visit  ?Medication Sig Dispense Refill  ? aspirin EC 81 MG tablet Take 1 tablet (81 mg total) by mouth daily. 30 tablet 1  ? atorvastatin (LIPITOR) 40 MG tablet Take 1 tablet (40 mg total) by mouth daily. 90 tablet 1  ? gabapentin (NEURONTIN) 600 MG tablet Take 1 tablet (600 mg total) by mouth 3 (three) times daily. 270 tablet 1  ? levETIRAcetam (KEPPRA XR) 500 MG 24 hr tablet Take 1 tablet (500 mg total) by mouth daily. 90 tablet 0  ? lisinopril-hydrochlorothiazide (ZESTORETIC) 20-25 MG tablet Take 1 tablet by mouth daily. 90 tablet 1  ? meclizine (ANTIVERT) 25 MG tablet Take 1 tablet (25 mg total) by mouth 3 (three) times daily as needed for dizziness. 30 tablet 0  ? ?No current facility-administered medications on file prior to visit.  ? ? ?Allergies:   ?Allergies  ?Allergen Reactions  ? Elavil [Amitriptyline] Other (See Comments)  ?  DIZZINESS  ? Topiramate Er Other (See Comments)  ?  Suicidal Thoughts- Trokendi  ? ? ?OBJECTIVE: ? ?Today's Vitals  ? 01/17/22 1105  ?BP: 123/80  ?Pulse: 70  ?Weight: 249 lb (112.9 kg)  ?Height: '5\' 8"'$  (1.727 m)  ? ? ?Body mass index is 37.86 kg/m?. ? ? ?Physical Exam ?Today's Vitals  ? 01/17/22 1105  ?BP: 123/80  ?Pulse: 70  ?Weight: 249 lb (112.9 kg)  ?Height: '5\' 8"'$  (1.727 m)  ? ?Body mass index is 37.86 kg/m?. ? ? ?General: well developed, well nourished, pleasant middle aged [place] female, seated, in no evident distress ?Head: head  normocephalic and atraumatic.   ?Neck: supple with no carotid or supraclavicular bruits ?Cardiovascular: regular rate and rhythm, no murmurs ?Musculoskeletal: no deformity ?Skin:  no rash/petichiae ?Vascular:  Normal pulses all extremities ?  ?Neurologic Exam ?Mental Status: Awake and fully alert. Normal speech and language. Oriented to place and time. Recent and remote memory intact. Attention span, concentration and fund of knowledge appropriate. Mood and affect appropriate.  ?Cranial Nerves: Pupils equal, briskly reactive to light. Extraocular movements full without nystagmus. Visual fields full to confrontation. Hearing intact. Facial sensation intact. Face, tongue, palate moves normally and symmetrically.  ?Motor: Normal  bulk and tone. Normal strength in all tested extremity muscles. ?Sensory.: intact to touch , pinprick , position and vibratory sensation.  ?Coordination: Rapid alternating movements normal in all extremities. Finger-to-nose and heel-to-shin performed accurately bilaterally. ?Gait and Station: Arises from chair without difficulty. Stance is normal. Gait demonstrates normal stride length and balance ?Reflexes: 1+ and symmetric. Toes downgoing.  ? ? ? ? ? ?ASSESSMENT: Alison Ward is a 61 y.o. 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  Remote history of hypertensive right temporal intracerebral hemorrhage in 2014. Recent worsening/change in headaches and reoccurrence of left sided intermittent paresthesias and pre-syncope symptoms - suspect in setting of increased stressors but will do imaging to rule out any other causes  ? ? ? ?PLAN: ? ?Obtain CTH to rule out any abnormality with prior ICH hx ?Increase Keppra to '1000mg'$  XR daily for seizure prophylaxis ?Will consider EEG if  symptoms persist ?Continue gabapentin 600 mg 3 times daily for headache prophylaxis managed by PCP - may need to consider increasing if headaches persist but hesitant to increase in setting of increasing keppra dosage today

## 2022-01-17 ENCOUNTER — Ambulatory Visit: Payer: Medicaid Other | Admitting: Adult Health

## 2022-01-17 ENCOUNTER — Encounter: Payer: Self-pay | Admitting: Adult Health

## 2022-01-17 VITALS — BP 123/80 | HR 70 | Ht 68.0 in | Wt 249.0 lb

## 2022-01-17 DIAGNOSIS — G40109 Localization-related (focal) (partial) symptomatic epilepsy and epileptic syndromes with simple partial seizures, not intractable, without status epilepticus: Secondary | ICD-10-CM | POA: Diagnosis not present

## 2022-01-17 DIAGNOSIS — G44229 Chronic tension-type headache, not intractable: Secondary | ICD-10-CM

## 2022-01-17 DIAGNOSIS — I619 Nontraumatic intracerebral hemorrhage, unspecified: Secondary | ICD-10-CM | POA: Diagnosis not present

## 2022-01-17 DIAGNOSIS — R519 Headache, unspecified: Secondary | ICD-10-CM | POA: Diagnosis not present

## 2022-01-17 DIAGNOSIS — R200 Anesthesia of skin: Secondary | ICD-10-CM | POA: Diagnosis not present

## 2022-01-17 MED ORDER — LEVETIRACETAM ER 500 MG PO TB24: 1000.0000 mg | ORAL_TABLET | Freq: Every day | ORAL | 3 refills | Status: DC

## 2022-01-17 NOTE — Patient Instructions (Signed)
Your Plan: ? ?Increase keppra from '500mg'$  to '1000mg'$  daily - you will take 2 '500mg'$  tablets ? ?You will be called to schedule a CT scan of your head  ? ? ? ?Follow up in 4 months or call earlier if needed  ? ? ? ? ?Thank you for coming to see [place] at [facility]. I hope we have been able to provide you high quality care today. ? ?You may receive a patient satisfaction survey over the next few weeks. We would appreciate your feedback and comments so that we may continue to improve ourselves and the health of our patients. ? ?

## 2022-01-18 ENCOUNTER — Telehealth: Payer: Self-pay | Admitting: Adult Health

## 2022-01-18 NOTE — Telephone Encounter (Signed)
[facility] community auth: [id number] (exp. 01/18/22 to 03/04/22) order sent to GI, they will reach out to the patient to schedule.  ?

## 2022-01-19 ENCOUNTER — Ambulatory Visit
Admission: RE | Admit: 2022-01-19 | Discharge: 2022-01-19 | Disposition: A | Discharge status: Home / Self Care | Payer: Medicaid Other | Source: Ambulatory Visit | Attending: Adult Health | Admitting: Adult Health

## 2022-01-19 DIAGNOSIS — R519 Headache, unspecified: Secondary | ICD-10-CM | POA: Diagnosis not present

## 2022-01-19 DIAGNOSIS — R200 Anesthesia of skin: Secondary | ICD-10-CM | POA: Diagnosis not present

## 2022-01-19 DIAGNOSIS — I619 Nontraumatic intracerebral hemorrhage, unspecified: Secondary | ICD-10-CM

## 2022-04-12 ENCOUNTER — Other Ambulatory Visit: Payer: Self-pay | Admitting: Adult Health

## 2022-04-12 DIAGNOSIS — G40109 Localization-related (focal) (partial) symptomatic epilepsy and epileptic syndromes with simple partial seizures, not intractable, without status epilepticus: Secondary | ICD-10-CM

## 2022-04-12 DIAGNOSIS — I619 Nontraumatic intracerebral hemorrhage, unspecified: Secondary | ICD-10-CM

## 2022-05-03 ENCOUNTER — Ambulatory Visit: Payer: Self-pay

## 2022-05-03 NOTE — Telephone Encounter (Signed)
Pt was called and given in person appointment with PCP for leg pain.

## 2022-05-03 NOTE — Telephone Encounter (Signed)
  Chief Complaint: leg pain Symptoms: leg pain and weakness affecting balance, RLE worse than L. Pt requiring more assistance  Frequency: 3 weeks Pertinent Negatives: NA Disposition: '[]'$ ED /'[]'$ Urgent Care (no appt availability in office) / '[]'$ Appointment(In office/virtual)/ '[]'$  [facility]/ '[]'$ Home Care/ '[]'$ Refused Recommended Disposition /'[x]'$ [facility]/ '[]'$  Follow-up with PCP Additional Notes: pt states her balance is getting weak d/t her legs and having to get assistance to get up or go walking and in/out of car. Pain 7/10 at times and worse at nights. Pt asked to see Dr. Margarita Rana but advised her no appts available. Recommended she can go to Mobile unit and provided location. Pt said she will try go there today. Pt also wanted to schedule appt for 05/31/22 and added to waitlist in case appt comes up sooner. Pt asked if going to Neuro provider would do any good and I explained that since it was her legs and not head, they would want her to see PCP or go to UC. Pt verbalized understanding.   Reason for Disposition  [1] MODERATE pain (e.g., interferes with normal activities, limping) AND [2] present > 3 days  Answer Assessment - Initial Assessment Questions 1. ONSET: "When did the pain start?"      Several days 2. LOCATION: "Where is the pain located?"      Bilat legs R leg worse  3. PAIN: "How bad is the pain?"    (Scale 1-10; or mild, moderate, severe)   -  MILD (1-3): doesn't interfere with normal activities    -  MODERATE (4-7): interferes with normal activities (e.g., work or school) or awakens from sleep, limping    -  SEVERE (8-10): excruciating pain, unable to do any normal activities, unable to walk     7  6. OTHER SYMPTOMS: "Do you have any other symptoms?" (e.g., chest pain, back pain, breathing difficulty, swelling, rash, fever, numbness, weakness)     Balance off and shakiness, hip pain, weakness, needing more assistance.  Protocols used: Leg Pain-A-AH

## 2022-05-15 ENCOUNTER — Ambulatory Visit
Admission: RE | Admit: 2022-05-15 | Discharge: 2022-05-15 | Disposition: A | Discharge status: Home / Self Care | Payer: Medicaid Other | Source: Ambulatory Visit | Attending: Family Medicine | Admitting: Family Medicine

## 2022-05-15 ENCOUNTER — Ambulatory Visit: Payer: Medicaid Other | Attending: Family Medicine | Admitting: Family Medicine

## 2022-05-15 ENCOUNTER — Encounter: Payer: Self-pay | Admitting: Family Medicine

## 2022-05-15 VITALS — BP 107/72 | HR 68 | Temp 98.1°F | Ht 68.0 in | Wt 251.2 lb

## 2022-05-15 DIAGNOSIS — M79652 Pain in left thigh: Secondary | ICD-10-CM | POA: Diagnosis not present

## 2022-05-15 DIAGNOSIS — G8929 Other chronic pain: Secondary | ICD-10-CM | POA: Diagnosis not present

## 2022-05-15 DIAGNOSIS — Z23 Encounter for immunization: Secondary | ICD-10-CM

## 2022-05-15 DIAGNOSIS — M25562 Pain in left knee: Secondary | ICD-10-CM

## 2022-05-15 DIAGNOSIS — M79651 Pain in right thigh: Secondary | ICD-10-CM | POA: Diagnosis not present

## 2022-05-15 DIAGNOSIS — Z1231 Encounter for screening mammogram for malignant neoplasm of breast: Secondary | ICD-10-CM

## 2022-05-15 NOTE — Progress Notes (Signed)
Leg pain. Swelling in left eyelid Trouble with balance. Meclizine is making her sleepy.

## 2022-05-15 NOTE — Progress Notes (Signed)
Subjective:  Patient ID: Alison Ward, female    DOB: [birth year]  Age: 61 y.o. MRN: [id number]  CC: Leg Pain   HPI Alison Ward is a 61 y.o. year old female with a history of hypertension, chronic headaches, previous history of right  Hemorrhagic stroke status post ventriculostomy in 06/2013, Prediabetes (A1c 5.9), meralgia paresthetica, hyperlipidemia  Interval History: Last visit with neurology was in 12/2021 at which time notes indicate Keppra dose was increased from 500 to 1000 mg but she states she is still taking the 500 mg  2 weeks ago she started to notice left knee pain and swelling, pain in right thigh, alternating hip pain, muscle cramps in her legs and they feel tight.  Denies leg swelling.  She uses Tylenol with some relief. To get up from the toilet she has hold onto something as she hears cracking in her joints.  Past Medical History:  Diagnosis Date   Chronic headaches    daily, bitemporal, associated with tingling in face   Heart murmur    Hypertension    Seizures ([place])    partial seizures last one on 10/22/15  On Keppra currently - last seizure 08-2018 x 2 small issues    Stroke ([place]) 07/05/2013    Past Surgical History:  Procedure Laterality Date   ABDOMINAL HYSTERECTOMY N/A 12/15/2015   Procedure: HYSTERECTOMY ABDOMINAL;  Surgeon: Emily Filbert, MD;  Location: [facility];  Service: Gynecology;  Laterality: N/A;   CESAREAN SECTION     ceserean section     COLONOSCOPY     POLYPECTOMY     SALPINGOOPHORECTOMY Bilateral 12/15/2015   Procedure: SALPINGO OOPHORECTOMY;  Surgeon: Emily Filbert, MD;  Location: [facility];  Service: Gynecology;  Laterality: Bilateral;   VENTRICULOSTOMY Left 07/07/2013   Procedure: VENTRICULOSTOMY;  Surgeon: Ophelia Charter, MD;  Location: [facility];  Service: Neurosurgery;  Laterality: Left;  Left Ventriculostomy and Removal of Right Vetriculostomy    Family History  Problem Relation Age of Onset   Hypertension Mother     Diabetes Mother    Stroke Mother    Hypertension Father    Colon cancer Neg Hx    Breast cancer Neg Hx    Colon polyps Neg Hx    Esophageal cancer Neg Hx    Rectal cancer Neg Hx    Stomach cancer Neg Hx     Social History   Socioeconomic History   Marital status: Divorced    Spouse name: Not on file   Number of children: 3   Years of education: college   Highest education level: Not on file  Occupational History   Occupation: [employer]  Tobacco Use   Smoking status: Never   Smokeless tobacco: Never  Vaping Use   Vaping Use: Never used  Substance and Sexual Activity   Alcohol use: No   Drug use: No   Sexual activity: Not on file  Other Topics Concern   Not on file  Social History Narrative   Not on file   Social Determinants of Health   Financial Resource Strain: Not on file  Food Insecurity: Not on file  Transportation Needs: Not on file  Physical Activity: Not on file  Stress: Not on file  Social Connections: Not on file    Allergies  Allergen Reactions   Elavil [Amitriptyline] Other (See Comments)    DIZZINESS   Topiramate Er Other (See Comments)    Suicidal Thoughts- [place]    Outpatient Medications  Prior to Visit  Medication Sig Dispense Refill   aspirin EC 81 MG tablet Take 1 tablet (81 mg total) by mouth daily. 30 tablet 1   atorvastatin (LIPITOR) 40 MG tablet Take 1 tablet (40 mg total) by mouth daily. 90 tablet 1   gabapentin (NEURONTIN) 600 MG tablet Take 1 tablet (600 mg total) by mouth 3 (three) times daily. 270 tablet 1   levETIRAcetam (KEPPRA XR) 500 MG 24 hr tablet Take 2 tablets (1,000 mg total) by mouth daily. 180 tablet 3   lisinopril-hydrochlorothiazide (ZESTORETIC) 20-25 MG tablet Take 1 tablet by mouth daily. 90 tablet 1   meclizine (ANTIVERT) 25 MG tablet Take 1 tablet (25 mg total) by mouth 3 (three) times daily as needed for dizziness. (Patient not taking: Reported on 05/15/2022) 30 tablet 0   No facility-administered  medications prior to visit.     ROS Review of Systems  Constitutional:  Negative for activity change, appetite change and fatigue.  HENT:  Negative for congestion, sinus pressure and sore throat.   Eyes:  Negative for visual disturbance.  Respiratory:  Negative for cough, chest tightness, shortness of breath and wheezing.   Cardiovascular:  Negative for chest pain and palpitations.  Gastrointestinal:  Negative for abdominal distention, abdominal pain and constipation.  Endocrine: Negative for polydipsia.  Genitourinary:  Negative for dysuria and frequency.  Musculoskeletal:        See HPI  Skin:  Negative for rash.  Neurological:  Negative for tremors, light-headedness and numbness.  Hematological:  Does not bruise/bleed easily.  Psychiatric/Behavioral:  Negative for agitation and behavioral problems.     Objective:  BP 107/72   Pulse 68   Temp 98.1 F (36.7 C) (Oral)   Ht '5\' 8"'$  (1.727 m)   Wt 251 lb 3.2 oz (113.9 kg)   LMP 11/24/2011   SpO2 97%   BMI 38.19 kg/m      63/17/2023   10:20 AM 01/17/2022   11:05 AM 01/02/2022    4:24 PM  BP/Weight  Systolic BP 161 096 045  Diastolic BP 72 80 83  Wt. (Lbs) 251.2 249 247  BMI 38.19 kg/m2 37.86 kg/m2 37.56 kg/m2      Physical Exam Constitutional:      Appearance: She is well-developed.  Cardiovascular:     Rate and Rhythm: Normal rate.     Heart sounds: Normal heart sounds. No murmur heard. Pulmonary:     Effort: Pulmonary effort is normal.     Breath sounds: Normal breath sounds. No wheezing or rales.  Chest:     Chest wall: No tenderness.  Abdominal:     General: Bowel sounds are normal. There is no distension.     Palpations: Abdomen is soft. There is no mass.     Tenderness: There is no abdominal tenderness.  Musculoskeletal:     Right lower leg: No edema.     Left lower leg: No edema.     Comments: Crepitus on range of motion of left knee; right knee is normal No tenderness to palpation of bilateral thigh  and bilateral legs  Neurological:     Mental Status: She is alert and oriented to person, place, and time.     Comments: Normal strength in bilateral lower extremities  Psychiatric:        Mood and Affect: Mood normal.        Latest Ref Rng & Units 01/02/2022    4:45 PM 01/20/2020    8:41 AM 05/06/2019  11:36 AM  CMP  Glucose 70 - 99 mg/dL 85  84  92   BUN 8 - 27 mg/dL '16  11  14   '$ Creatinine 0.57 - 1.00 mg/dL 0.87  0.96  1.03   Sodium 134 - 144 mmol/L 148  144  142   Potassium 3.5 - 5.2 mmol/L 4.3  3.8  3.4   Chloride 96 - 106 mmol/L 105  101  99   CO2 20 - 29 mmol/L '26  26  27   '$ Calcium 8.7 - 10.3 mg/dL 9.7  9.5  9.8   Total Protein 6.0 - 8.5 g/dL 7.3  7.1  7.3   Total Bilirubin 0.0 - 1.2 mg/dL 0.9  1.0  1.1   Alkaline Phos 44 - 121 IU/L 67  66  67   AST 0 - 40 IU/L '22  23  24   '$ ALT 0 - 32 IU/L '12  15  17     '$ Lipid Panel     Component Value Date/Time   CHOL 138 01/20/2020 0841   TRIG 174 (H) 01/20/2020 0841   HDL 37 (L) 01/20/2020 0841   CHOLHDL 3.7 01/20/2020 0841   CHOLHDL 6.4 (H) 12/06/2016 1012   VLDL 35 (H) 12/06/2016 1012   LDLCALC 71 01/20/2020 0841    CBC    Component Value Date/Time   WBC 4.0 12/06/2016 1012   RBC 4.59 12/06/2016 1012   HGB 12.8 12/06/2016 1012   HCT 39.7 12/06/2016 1012   PLT 267 12/06/2016 1012   MCV 86.5 12/06/2016 1012   MCH 27.9 12/06/2016 1012   MCHC 32.2 12/06/2016 1012   RDW 16.1 (H) 12/06/2016 1012   LYMPHSABS 1,600 12/06/2016 1012   MONOABS 400 12/06/2016 1012   EOSABS 80 12/06/2016 1012   BASOSABS 40 12/06/2016 1012    Lab Results  Component Value Date   HGBA1C 5.8 01/02/2022    Assessment & Plan:  1. Chronic pain of left knee Advised to use OTC Voltaren gel Weight loss will be beneficial Encouraged to continue exercising - Ambulatory referral to Physical Therapy - DG Knee Complete 4 Views Left; Future  2. Encounter for screening mammogram for malignant neoplasm of breast - MM DIGITAL SCREENING BILATERAL;  Future  3. Pain in both thighs - Ambulatory referral to Physical Therapy   Health Care Maintenance: Tdap administered today. No orders of the defined types were placed in this encounter.   Follow-up: Return for follow up, keep previously scheduled appointment.       Charlott Rakes, MD, FAAFP. [facility] and [facility] [place], [place]   05/15/2022, 12:21 PM

## 2022-05-15 NOTE — Patient Instructions (Signed)
Chronic Knee Pain, Adult Chronic knee pain is pain in one or both knees that lasts longer than 3 months. Symptoms of chronic knee pain may include swelling, stiffness, and discomfort. Age-related wear and tear (osteoarthritis) of the knee joint is the most common cause of chronic knee pain. Other possible causes include: A long-term immune-related disease that causes inflammation of the knee (rheumatoid arthritis). This usually affects both knees. Inflammatory arthritis, such as gout or pseudogout. An injury to the knee that causes arthritis. An injury to the knee that damages the ligaments. Ligaments are strong tissues that connect bones to each other. Runner's knee or pain behind the kneecap. Treatment for chronic knee pain depends on the cause. The main treatments for chronic knee pain are physical therapy and weight loss. This condition may also be treated with medicines, injections, a knee sleeve or brace, and by using crutches. Rest, ice, pressure (compression), and elevation, also known as RICE therapy, may also be recommended. Follow these instructions at home: If you have a knee sleeve or brace:  Wear the knee sleeve or brace as told by your health care provider. Remove it only as told by your health care provider. Loosen it if your toes tingle, become numb, or turn cold and blue. Keep it clean. If the sleeve or brace is not waterproof: Do not let it get wet. Remove it if allowed by your health care provider, or cover it with a watertight covering when you take a bath or a shower. Managing pain, stiffness, and swelling     If directed, apply heat to the affected area as often as told by your health care provider. Use the heat source that your health care provider recommends, such as a moist heat pack or a heating pad. If you have a removable knee sleeve or brace, remove it as told by your health care provider. Place a towel between your skin and the heat source. Leave the heat on for  20-30 minutes. Remove the heat if your skin turns bright red. This is especially important if you are unable to feel pain, heat, or cold. You may have a greater risk of getting burned. If directed, put ice on the affected area. To do this: If you have a removable knee sleeve or brace, remove it as told by your health care provider. Put ice in a plastic bag. Place a towel between your skin and the bag. Leave the ice on for 20 minutes, 2-3 times a day. Remove the ice if your skin turns bright red. This is very important. If you cannot feel pain, heat, or cold, you have a greater risk of damage to the area. Move your toes often to reduce stiffness and swelling. Raise (elevate) the injured area above the level of your heart while you are sitting or lying down. Activity Avoid high-impact activities or exercises, such as running, jumping rope, or doing jumping jacks. Follow the exercise plan that your health care provider designed for you. Your health care provider may suggest that you: Avoid activities that make knee pain worse. This may require you to change your exercise routines, sport participation, or job duties. Wear shoes with cushioned soles. Avoid sports that require running and sudden changes in direction. Do physical therapy. Physical therapy is planned to match your needs and abilities. It may include exercises for strength, flexibility, stability, and endurance. Do exercises that increase balance and strength, such as tai chi and yoga. Do not use the injured limb to support your   body weight until your health care provider says that you can. Use crutches as told by your health care provider. Return to your normal activities as told by your health care provider. Ask your health care provider what activities are safe for you. General instructions Take over-the-counter and prescription medicines only as told by your health care provider. Lose weight if you are overweight. Losing even a  little weight can reduce knee pain. Ask your health care provider what your ideal weight is, and how to safely lose extra weight. A dietitian may be able to help you plan your meals. Do not use any products that contain nicotine or tobacco, such as cigarettes, e-cigarettes, and chewing tobacco. These can delay healing. If you need help quitting, ask your health care provider. Keep all follow-up visits. This is important. Contact a health care provider if: You have knee pain that is not getting better or gets worse. You are unable to do your physical therapy exercises due to knee pain. Get help right away if: Your knee swells and the swelling becomes worse. You cannot move your knee. You have severe knee pain. Summary Knee pain that lasts more than 3 months is considered chronic knee pain. The main treatments for chronic knee pain are physical therapy and weight loss. You may also need to take medicines, wear a knee sleeve or brace, use crutches, and apply ice or heat. Losing even a little weight can reduce knee pain. Ask your health care provider what your ideal weight is, and how to safely lose extra weight. A dietitian may be able to help you plan your meals. Follow the exercise plan that your health care provider designed for you. This information is not intended to replace advice given to you by your health care provider. Make sure you discuss any questions you have with your health care provider. Document Revised: 03/31/2020 Document Reviewed: 03/31/2020 Elsevier Patient Education  [place].

## 2022-05-16 ENCOUNTER — Other Ambulatory Visit: Payer: Self-pay | Admitting: Family Medicine

## 2022-05-16 DIAGNOSIS — G8929 Other chronic pain: Secondary | ICD-10-CM

## 2022-05-19 ENCOUNTER — Ambulatory Visit (INDEPENDENT_AMBULATORY_CARE_PROVIDER_SITE_OTHER): Payer: Medicaid Other | Admitting: Orthopaedic Surgery

## 2022-05-19 DIAGNOSIS — M1712 Unilateral primary osteoarthritis, left knee: Secondary | ICD-10-CM | POA: Insufficient documentation

## 2022-05-19 NOTE — Progress Notes (Signed)
Office Visit Note   Patient: Alison Ward           Date of Birth: Nov 20, 1960           MRN: [id number] Visit Date: 05/19/2022              Requested by: Charlott Rakes, MD [address] [place],  [place] [id number] PCP: Charlott Rakes, MD   Assessment & Plan: Visit Diagnoses:  1. Primary osteoarthritis of left knee     Plan: Impression is advanced left knee DJD.  Pain is severe and constant and interfering with daily activities and quality of life.  Based on her options of continued Conservative management versus total knee replacement she has elected for a total knee replacement.  We will need necessary clearances from PCP and neurologist.  She continue taking the baby aspirin through the perioperative period.  Risk benefits prognosis total knee replacement reviewed with the patient in detail.  She has a strong support system with her daughters.  Questions encouraged and answered.  Follow-Up Instructions: No follow-ups on file.   Orders:  No orders of the defined types were placed in this encounter.  No orders of the defined types were placed in this encounter.     Procedures: No procedures performed   Clinical Data: No additional findings.   Subjective: Chief Complaint  Patient presents with   Left Knee - Pain    HPI Cecilia is a pleasant 61 year old female here for chronic severe left knee pain that is 8 out of 10.  Disabled from a stroke.  Lives with her 2 daughters.  Has constant severe debilitating pain interfering with quality life and daily activities.  Reports start up pain and getting out of car.  Has done physical therapy in the past for this that was prescribed by PCP.  Can only take Tylenol due to medical comorbidities.  Review of Systems  Constitutional: Negative.   HENT: Negative.    Eyes: Negative.   Respiratory: Negative.    Cardiovascular: Negative.   Endocrine: Negative.   Musculoskeletal: Negative.   Neurological:  Negative.   Hematological: Negative.   Psychiatric/Behavioral: Negative.    All other systems reviewed and are negative.    Objective: Vital Signs: LMP 11/24/2011   Physical Exam Vitals and nursing note reviewed.  Constitutional:      Appearance: She is well-developed.  HENT:     Head: Atraumatic.     Nose: Nose normal.  Eyes:     Extraocular Movements: Extraocular movements intact.  Cardiovascular:     Pulses: Normal pulses.  Pulmonary:     Effort: Pulmonary effort is normal.  Abdominal:     Palpations: Abdomen is soft.  Musculoskeletal:     Cervical back: Neck supple.  Skin:    General: Skin is warm.     Capillary Refill: Capillary refill takes less than 2 seconds.  Neurological:     Mental Status: She is alert. Mental status is at baseline.  Psychiatric:        Behavior: Behavior normal.        Thought Content: Thought content normal.        Judgment: Judgment normal.     Ortho Exam Examination of the left knee shows pain and crepitus throughout range of motion.  Joint line tenderness.  Collaterals and cruciates are stable.  No joint effusion. Specialty Comments:  No specialty comments available.  Imaging: No results found.   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   Meralgia paresthetica of left side 09/07/2015   Fainting spell 09/07/2015   Prediabetes 09/02/2015   Stye 08/06/2014   Essential hypertension, benign 04/16/2014   CVA (cerebral vascular accident) ([place]) 04/16/2014   Headache 04/16/2014   Tension headache 03/06/2014   Obesity, unspecified 08/13/2013    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[place]) 07/22/2013   ICH (intracerebral hemorrhage) ([place]) 07/21/2013   Hypokalemia 07/21/2013   Intracerebral hemorrhage ([place]) 07/06/2013   Essential hypertension 02/24/2009   Past Medical History:  Diagnosis Date   Chronic headaches    daily, bitemporal, associated with tingling in face   Heart murmur    Hypertension    Seizures ([place])    partial seizures last one on 10/22/15  On Keppra currently - last seizure 08-2018 x 2 small issues    Stroke ([place]) 07/05/2013    Family History  Problem Relation Age of Onset   Hypertension Mother    Diabetes Mother    Stroke Mother    Hypertension Father    Colon cancer Neg Hx    Breast cancer Neg Hx    Colon polyps Neg Hx    Esophageal cancer Neg Hx    Rectal cancer Neg Hx    Stomach cancer Neg Hx     Past Surgical History:  Procedure Laterality Date   ABDOMINAL HYSTERECTOMY N/A 12/15/2015   Procedure: HYSTERECTOMY ABDOMINAL;  Surgeon: Emily Filbert, MD;  Location: [facility];  Service: Gynecology;  Laterality: N/A;   CESAREAN SECTION     ceserean section     COLONOSCOPY     POLYPECTOMY     SALPINGOOPHORECTOMY Bilateral 12/15/2015   Procedure: SALPINGO OOPHORECTOMY;  Surgeon: Emily Filbert, MD;  Location: [facility];  Service: Gynecology;  Laterality: Bilateral;   VENTRICULOSTOMY Left 07/07/2013   Procedure: VENTRICULOSTOMY;  Surgeon: Ophelia Charter, MD;  Location: [facility];  Service: Neurosurgery;  Laterality: Left;  Left Ventriculostomy and Removal of Right Vetriculostomy   Social History   Occupational History   Occupation: [employer]  Tobacco Use   Smoking status: Never   Smokeless tobacco: Never  Vaping Use   Vaping Use: Never used  Substance and Sexual Activity   Alcohol use: No   Drug use: No   Sexual activity: Not on file

## 2022-05-23 NOTE — Progress Notes (Unsigned)
[facility] [address] [place]. [place] [address] [phone]       OFFICE FOLLOW UP NOTE  [name] Date of Birth:  October 19, 1961 Medical Record Number:  [id number]     CHIEF COMPLAINT:  No chief complaint on file.      HPI:  Patient initially seen by Dr. Leonie Man in 2015 for right parietal temporal ICH with surrounding edema and intraventricular extension s/p right ventriculostomy catheter. Post ICH left-sided paresthesias which improved but reported intermittent left leg numbness and over time started to affect her left arm and face. Also experiencing headaches ever since ICH. She reported recurrent episodes of near syncope since 2016 which resolved after starting Keppra.   Update 05/23/2022 JM: Patient returns for follow-up after prior visit 4 months ago.  She was reporting transient episodes of left-sided paresthesias as well as left-sided headache.  Completed CT head which did not show any acute findings or new findings compared to prior imaging.  Recommended increasing Keppra XR dosage to 1000 mg daily but currently has only been taking 500 mg.  She has not had any additional episodes.      History provided for reference purposes only Update 01/16/2022 JM: 61 year old female with history of stroke, seizure disorder and chronic headaches.  She returns today for follow-up visit and medication management.  Prior visit almost 2 years ago as she has spent a prolonged period of time in the [place] visiting family. Over the past couple months, she has been caring for her ill mother in [place] and has started feeling left sided headaches and left sided paresthesias . Was not feeling these while in [place] which she believes is because she was resting and relaxing.  She also reports dizziness and imbalance like she may pass out but thankfully has not had any loss of consciousness.  Reports compliance on Keppra XR 500 mg daily and at times will take  an extra dose of keppra if feeling like she may pass out. She will check BP which has been normal at those times. She has remained on gabapentin 600 mg 3 times daily with benefit of typical headaches. Her current headaches feel different than her normal headaches.  Otherwise, denies new stroke/TIA symptoms such as visual changes, weakness, speech difficulty or altered mental status    HISTORY:         ROS:   14 system review of systems performed and negative with exception of those listed in HPI  PMH:  Past Medical History:  Diagnosis Date   Chronic headaches    daily, bitemporal, associated with tingling in face   Heart murmur    Hypertension    Seizures ([place])    partial seizures last one on 10/22/15  On Keppra currently - last seizure 08-2018 x 2 small issues    Stroke ([place]) 07/05/2013    PSH:  Past Surgical History:  Procedure Laterality Date   ABDOMINAL HYSTERECTOMY N/A 12/15/2015   Procedure: HYSTERECTOMY ABDOMINAL;  Surgeon: Emily Filbert, MD;  Location: [facility];  Service: Gynecology;  Laterality: N/A;   CESAREAN SECTION     ceserean section     COLONOSCOPY     POLYPECTOMY     SALPINGOOPHORECTOMY Bilateral 12/15/2015   Procedure: SALPINGO OOPHORECTOMY;  Surgeon: Emily Filbert, MD;  Location: [facility];  Service: Gynecology;  Laterality: Bilateral;   VENTRICULOSTOMY Left 07/07/2013   Procedure: VENTRICULOSTOMY;  Surgeon: Ophelia Charter, MD;  Location: [facility];  Service: Neurosurgery;  Laterality: Left;  Left Ventriculostomy and Removal of Right Vetriculostomy    Social History:  Social History   Socioeconomic History   Marital status: Divorced    Spouse name: Not on file   Number of children: 3   Years of education: college   Highest education level: Not on file  Occupational History   Occupation: [employer]  Tobacco Use   Smoking status: Never   Smokeless tobacco: Never  Vaping Use   Vaping Use: Never used  Substance and Sexual Activity   Alcohol  use: No   Drug use: No   Sexual activity: Not on file  Other Topics Concern   Not on file  Social History Narrative   Not on file   Social Determinants of Health   Financial Resource Strain: Not on file  Food Insecurity: Not on file  Transportation Needs: Not on file  Physical Activity: Not on file  Stress: Not on file  Social Connections: Not on file  Intimate Partner Violence: Not on file    Family History:  Family History  Problem Relation Age of Onset   Hypertension Mother    Diabetes Mother    Stroke Mother    Hypertension Father    Colon cancer Neg Hx    Breast cancer Neg Hx    Colon polyps Neg Hx    Esophageal cancer Neg Hx    Rectal cancer Neg Hx    Stomach cancer Neg Hx     Medications:   Current Outpatient Medications on File Prior to Visit  Medication Sig Dispense Refill   aspirin EC 81 MG tablet Take 1 tablet (81 mg total) by mouth daily. 30 tablet 1   atorvastatin (LIPITOR) 40 MG tablet Take 1 tablet (40 mg total) by mouth daily. 90 tablet 1   gabapentin (NEURONTIN) 600 MG tablet Take 1 tablet (600 mg total) by mouth 3 (three) times daily. 270 tablet 1   levETIRAcetam (KEPPRA XR) 500 MG 24 hr tablet Take 2 tablets (1,000 mg total) by mouth daily. 180 tablet 3   lisinopril-hydrochlorothiazide (ZESTORETIC) 20-25 MG tablet Take 1 tablet by mouth daily. 90 tablet 1   meclizine (ANTIVERT) 25 MG tablet Take 1 tablet (25 mg total) by mouth 3 (three) times daily as needed for dizziness. (Patient not taking: Reported on 05/15/2022) 30 tablet 0   No current facility-administered medications on file prior to visit.    Allergies:   Allergies  Allergen Reactions   Elavil [Amitriptyline] Other (See Comments)    DIZZINESS   Topiramate Er Other (See Comments)    Suicidal Thoughts- [place]    OBJECTIVE:  There were no vitals filed for this visit.   There is no height or weight on file to calculate BMI.   Physical Exam There were no vitals filed for this  visit.  There is no height or weight on file to calculate BMI.   General: well developed, well nourished, pleasant middle aged [place] female, seated, in no evident distress Head: head normocephalic and atraumatic.   Neck: supple with no carotid or supraclavicular bruits Cardiovascular: regular rate and rhythm, no murmurs Musculoskeletal: no deformity Skin:  no rash/petichiae Vascular:  Normal pulses all extremities   Neurologic Exam Mental Status: Awake and fully alert. Normal speech and language. Oriented to place and time. Recent and remote memory intact. Attention span, concentration and fund of knowledge appropriate. Mood and affect appropriate.  Cranial Nerves: Pupils equal, briskly reactive to light. Extraocular movements full without nystagmus. Visual fields full  to confrontation. Hearing intact. Facial sensation intact. Face, tongue, palate moves normally and symmetrically.  Motor: Normal bulk and tone. Normal strength in all tested extremity muscles. Sensory.: intact to touch , pinprick , position and vibratory sensation.  Coordination: Rapid alternating movements normal in all extremities. Finger-to-nose and heel-to-shin performed accurately bilaterally. Gait and Station: Arises from chair without difficulty. Stance is normal. Gait demonstrates normal stride length and balance Reflexes: 1+ and symmetric. Toes downgoing.       ASSESSMENT: Alison Ward is a 61 y.o. 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  Remote history of hypertensive right temporal intracerebral hemorrhage in 2014. Recent worsening/change in headaches and reoccurrence of left sided intermittent paresthesias and pre-syncope symptoms - CTH negative for acute findings,     PLAN:  Continue  Keppra to '1000mg'$  XR daily for seizure prophylaxis Will consider EEG if symptoms persist Continue gabapentin 600 mg 3 times daily for headache prophylaxis managed by PCP - may need to consider increasing if headaches persist but hesitant to increase in setting of increasing keppra dosage today  Continue aspirin 81 mg and atorvastatin 40 mg for secondary stroke prevention Continue to follow with PCP for chronic disease management    Recommend a follow-up in 4 months or call earlier if needed     I spent 34 minutes of face-to-face and non-face-to-face time with patient.  This included previsit chart review, lab review, study review, order entry, electronic health record documentation, patient education regarding recent worsening of symptoms and possible causes, hx of stroke, seizure history,  importance of managing stroke risk factors and answered all questions to patient satisfaction  Frann Rider, AGNP-BC  [facility] [address] [place], [place] [address]  Phone [phone] Fax [phone] Note: This document was prepared with digital dictation and possible smart phrase technology. Any transcriptional errors that result from this process are unintentional.

## 2022-05-24 ENCOUNTER — Ambulatory Visit (INDEPENDENT_AMBULATORY_CARE_PROVIDER_SITE_OTHER): Payer: Medicaid Other | Admitting: Adult Health

## 2022-05-24 ENCOUNTER — Encounter: Payer: Self-pay | Admitting: Adult Health

## 2022-05-24 VITALS — BP 111/73 | HR 72 | Ht 68.0 in | Wt 252.0 lb

## 2022-05-24 DIAGNOSIS — I619 Nontraumatic intracerebral hemorrhage, unspecified: Secondary | ICD-10-CM | POA: Diagnosis not present

## 2022-05-24 DIAGNOSIS — G40109 Localization-related (focal) (partial) symptomatic epilepsy and epileptic syndromes with simple partial seizures, not intractable, without status epilepticus: Secondary | ICD-10-CM | POA: Diagnosis not present

## 2022-05-24 MED ORDER — LEVETIRACETAM ER 500 MG PO TB24: 500.0000 mg | ORAL_TABLET | Freq: Every evening | ORAL | 3 refills | Status: DC

## 2022-05-24 NOTE — Patient Instructions (Addendum)
Your Plan:  Continue current use of Keppra XR '500mg'$  daily for seizure prevention - would not recommend taking extra dosage of keppra as it likely will do limited to no benefit for you   Continue secondary stroke prevention measures with PCP and ongoing use of aspirin and atorvastatin     Follow up in 6 months or call earlier if needed     Thank you for coming to see [place] at [facility]. I hope we have been able to provide you high quality care today.  You may receive a patient satisfaction survey over the next few weeks. We would appreciate your feedback and comments so that we may continue to improve ourselves and the health of our patients.

## 2022-05-26 ENCOUNTER — Ambulatory Visit: Payer: Medicaid Other

## 2022-05-29 ENCOUNTER — Telehealth: Payer: Self-pay | Admitting: *Deleted

## 2022-05-29 NOTE — Telephone Encounter (Signed)
Surgical clearance letter faxed to [facility], received confirmation. Copy sent to medical records to be scanned.

## 2022-05-29 NOTE — Telephone Encounter (Signed)
Okay to proceed with left total knee arthroplasty.  Can hold aspirin 3 to 5 days prior to procedure with small but acceptable risk of recurrent stroke while off therapy and recommend starting immediately after or once hemodynamically stable. This was recently discussed with patient at recent visit who verbalized understanding. Completed clearance formed.  Placed in outbox. Thank you.

## 2022-05-29 NOTE — Telephone Encounter (Signed)
Received surgical clearance letter from [facility] re: left total knee arthroplasty, spinal anesthesia.  Placed on NP's desk for completion.

## 2022-05-30 ENCOUNTER — Ambulatory Visit: Payer: Medicaid Other | Attending: Family Medicine | Admitting: Rehabilitative and Restorative Service Providers"

## 2022-05-30 ENCOUNTER — Encounter: Payer: Self-pay | Admitting: Rehabilitative and Restorative Service Providers"

## 2022-05-30 ENCOUNTER — Other Ambulatory Visit: Payer: Self-pay

## 2022-05-30 DIAGNOSIS — G8929 Other chronic pain: Secondary | ICD-10-CM | POA: Diagnosis present

## 2022-05-30 DIAGNOSIS — M79652 Pain in left thigh: Secondary | ICD-10-CM | POA: Diagnosis not present

## 2022-05-30 DIAGNOSIS — M6281 Muscle weakness (generalized): Secondary | ICD-10-CM | POA: Diagnosis present

## 2022-05-30 DIAGNOSIS — M25562 Pain in left knee: Secondary | ICD-10-CM | POA: Insufficient documentation

## 2022-05-30 DIAGNOSIS — R2689 Other abnormalities of gait and mobility: Secondary | ICD-10-CM | POA: Insufficient documentation

## 2022-05-30 DIAGNOSIS — M79651 Pain in right thigh: Secondary | ICD-10-CM | POA: Diagnosis not present

## 2022-05-30 DIAGNOSIS — R42 Dizziness and giddiness: Secondary | ICD-10-CM | POA: Diagnosis present

## 2022-05-30 NOTE — Therapy (Signed)
OUTPATIENT PHYSICAL THERAPY LOWER EXTREMITY EVALUATION   Patient Name: Alison Ward MRN: [id number] DOB:[birth year], 61 y.o., female Today's Date: 05/30/2022   PT End of Session - 05/30/22 1240     Visit Number 1    Date for PT Re-Evaluation 07/21/22    Authorization Type [employer] Medicaid    PT Start Time 1230    PT Stop Time 1310    PT Time Calculation (min) 40 min    Activity Tolerance Patient tolerated treatment well    Behavior During Therapy WFL for tasks assessed/performed             Past Medical History:  Diagnosis Date   Chronic headaches    daily, bitemporal, associated with tingling in face   Heart murmur    Hypertension    Seizures ([place])    partial seizures last one on 10/22/15  On Keppra currently - last seizure 08-2018 x 2 small issues    Stroke ([place]) 07/05/2013   Past Surgical History:  Procedure Laterality Date   ABDOMINAL HYSTERECTOMY N/A 12/15/2015   Procedure: HYSTERECTOMY ABDOMINAL;  Surgeon: Alison Filbert, MD;  Location: [facility];  Service: Gynecology;  Laterality: N/A;   CESAREAN SECTION     ceserean section     COLONOSCOPY     POLYPECTOMY     SALPINGOOPHORECTOMY Bilateral 12/15/2015   Procedure: SALPINGO OOPHORECTOMY;  Surgeon: Alison Filbert, MD;  Location: [facility];  Service: Gynecology;  Laterality: Bilateral;   VENTRICULOSTOMY Left 07/07/2013   Procedure: VENTRICULOSTOMY;  Surgeon: Alison Charter, MD;  Location: [facility];  Service: Neurosurgery;  Laterality: Left;  Left Ventriculostomy and Removal of Right Vetriculostomy   Patient Active Problem List   Diagnosis Date Noted   Primary osteoarthritis of left knee 05/19/2022   Lumbar radiculopathy 03/11/2018   Ganglion cyst of right foot 07/04/2017   Chronic tension-type headache, not intractable 12/30/2015   Post-operative state 12/15/2015   Simple partial seizure disorder ([place]) 11/04/2015   Left sided numbness 09/25/2015   Leukopenia 09/25/2015   Tension vascular headache 09/07/2015    Meralgia paresthetica of left side 09/07/2015   Fainting spell 09/07/2015   Prediabetes 09/02/2015   Stye 08/06/2014   Essential hypertension, benign 04/16/2014   CVA (cerebral vascular accident) ([place]) 04/16/2014   Headache 04/16/2014   Tension headache 03/06/2014   Obesity, unspecified 08/13/2013   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[place]) 07/22/2013   ICH (intracerebral hemorrhage) ([place]) 07/21/2013   Hypokalemia 07/21/2013   Intracerebral hemorrhage ([place]) 07/06/2013   Essential hypertension 02/24/2009    PCP: Alison Rakes, MD   REFERRING PROVIDER: Charlott Rakes, MD   REFERRING DIAG: [phone] (ICD-10-CM) - Chronic pain of left knee M79.651,M79.652 (ICD-10-CM) - Pain in both thighs   THERAPY DIAG:  Chronic pain of left knee - Plan: PT plan of care cert/re-cert  Other abnormalities of gait and mobility - Plan: PT plan of care cert/re-cert  Muscle weakness (generalized) - Plan: PT plan of care cert/re-cert  Dizziness and giddiness - Plan: PT plan of care cert/re-cert  Rationale for Evaluation and Treatment Rehabilitation  ONSET DATE: 05/15/2022 order received.  Pt reports intermittent pain since March 2023.  SUBJECTIVE:   SUBJECTIVE STATEMENT: Pt reports that she has had knee pain since 2018, but the pain has gotten worse since March 2023.  Pt reports that she has been traveling by plane more recently and she feels that the increased time in the small airplane seats.  PERTINENT HISTORY: CVA with right sided weakness in  2017, Dizziness, HTN  PAIN:  Are you having pain? Yes: NPRS scale: 8/10 Pain location: left knee Pain description: aching and sharp Aggravating factors: sit to stand transfer Relieving factors: rest in certain positions  PRECAUTIONS: None  WEIGHT BEARING RESTRICTIONS No  FALLS:  Has patient fallen in last 6 months? No  LIVING ENVIRONMENT: Lives with: lives with their family Lives in: House/apartment Stairs: Yes: Internal: 17 steps;  on right going up and External: 1 steps; none Has following equipment at home: None  OCCUPATION: medically disabled following brain surgery  PLOF: Independent and Leisure: traveling, playing with grandchildren, ministering to women and children  PATIENT GOALS:  To be safer and more independent to not need to rely on others.   OBJECTIVE:   DIAGNOSTIC FINDINGS:  Left knee radiograph on 05/15/22:   IMPRESSION: Severe lateral and patellofemoral compartment osteoarthritis.  PATIENT SURVEYS:  05/30/2022:  LEFS 22 / 80 = 27.5 %  COGNITION:  Overall cognitive status: Within functional limits for tasks assessed     SENSATION: WFL  MUSCLE LENGTH: Tight hamstrings bilaterally with left more impaired than right  PALPATION: Tenderness to palpation over left knee  LOWER EXTREMITY ROM:  Active ROM Right eval Left eval  Hip flexion    Hip extension    Hip abduction    Hip adduction    Hip internal rotation    Hip external rotation    Knee flexion 115 110  Knee extension 0 5  Ankle dorsiflexion    Ankle plantarflexion    Ankle inversion    Ankle eversion     (Blank rows = not tested)  LOWER EXTREMITY MMT:  MMT Right eval Left eval  Hip flexion 4 4-  Hip extension 4 4-  Hip abduction 4 4-  Hip adduction 4 4-  Hip internal rotation    Hip external rotation    Knee flexion 5 4  Knee extension 5 4  Ankle dorsiflexion    Ankle plantarflexion    Ankle inversion    Ankle eversion     (Blank rows = not tested)  LOWER EXTREMITY SPECIAL TESTS:  Knee special tests: Anterior drawer test: negative and Posterior drawer test: negative  FUNCTIONAL TESTS:   05/30/2022 5 times sit to stand: 29.7 sec with use of BUE Timed up and go (TUG): 15.3 sec without assistive device, with some unsteadiness noted with 180 degree turn  GAIT: Distance walked: 100 ft Assistive device utilized: None Level of assistance: Modified independence Comments: Pt with antalgic gait pattern, pt  recommend that she may benefit from use of SPC    TODAY'S TREATMENT: 05/30/2022 :  reviewed HEP provided. If treatment provided at initial evaluation, no treatment charged due to lack of authorization.        PATIENT EDUCATION:  Education details: Issued HEP Person educated: Patient Education method: Explanation, Media planner, and Handouts Education comprehension: verbalized understanding and returned demonstration   HOME EXERCISE PROGRAM: Access Code: [email or web address] URL: [email or web address] Date: 05/30/2022 Prepared by: Juel Burrow  Exercises - Seated Heel Toe Raises  - 1 x daily - 7 x weekly - 2 sets - 10 reps - Seated March  - 1 x daily - 7 x weekly - 2 sets - 10 reps - Seated Long Arc Quad  - 1 x daily - 7 x weekly - 2 sets - 10 reps - Seated Hip Adduction Isometrics with Ball  - 1 x daily - 7 x weekly - 2 sets - 10 reps  ASSESSMENT:  CLINICAL IMPRESSION:  Patient is a 61 y.o. female who was seen today for physical therapy evaluation and treatment for left knee pain and bilateral thigh pain. Prior to CVA in 2017 and subsequent brain surgery secondary to seizures, pt was working and living independently.  After pt started having seizures, she moved in with her daughter who lives in a 2 level home with bedrooms upstairs.  Pt reports that she has a history of right leg pain and weakness, but since March, she has noticed increasing left knee pain and difficulty performing functional tasks, especially stairs in her home.  Pt with a history of dizziness, but denies any falls in the past 6 months.  Pt presents with knee pain, weakness, difficulty walking, tight hamstrings, and decreased balance.  Pt would benefit from skilled PT to address her functional impairments to allow her to be more independent.   OBJECTIVE IMPAIRMENTS decreased balance, difficulty walking, decreased strength, dizziness, impaired flexibility, and pain.   ACTIVITY LIMITATIONS squatting, stairs, and  transfers  PARTICIPATION LIMITATIONS: cleaning, laundry, and community activity  PERSONAL FACTORS Time since onset of injury/illness/exacerbation and 3+ comorbidities: CVA with right sided weakness, Seizures, HTN  are also affecting patient's functional outcome.   REHAB POTENTIAL: Good  CLINICAL DECISION MAKING: Evolving/moderate complexity  EVALUATION COMPLEXITY: Moderate   GOALS: Goals reviewed with patient? Yes  SHORT TERM GOALS: Target date: 06/20/2022  Pt will be independent with initial HEP. Baseline: Goal status: INITIAL  2.  Pt will improve TUG time to less than 14 seconds to place her at a low risk of falling. Baseline: 15.3 sec Goal status: INITIAL   LONG TERM GOALS: Target date: 07/25/2022   Pt will be independent with advanced HEP. Baseline:  Goal status: INITIAL  2.  Pt will increase LEFS to at least 50% to demonstrate improvements in functional tasks. Baseline: 27.5% Goal status: INITIAL  3.  Pt will increase BLE strength to at least 4+/5 to allow her to negotiate stairs with improved ease. Baseline:  Goal status: INITIAL  4.  Pt will be able to ambulate for at least 15 minutes with LRAD and without reports of increased pain. Baseline:  Goal status: INITIAL    PLAN: PT FREQUENCY: 2x/week  PT DURATION: 8 weeks  PLANNED INTERVENTIONS: Therapeutic exercises, Therapeutic activity, Neuromuscular re-education, Balance training, Gait training, Patient/Family education, Self Care, Joint mobilization, Joint manipulation, Stair training, Vestibular training, Canalith repositioning, Aquatic Therapy, Dry Needling, Electrical stimulation, Cryotherapy, Moist heat, Taping, Ultrasound, Ionotophoresis '4mg'$ /ml Dexamethasone, Manual therapy, and Re-evaluation  PLAN FOR NEXT SESSION: assess and progress HEP as indicated, strengthening, flexibility   Juel Burrow, PT 05/30/2022, 1:38 PM   [facility] [address], [place],  [place] [address] Phone # [phone] Fax [phone]

## 2022-05-31 ENCOUNTER — Ambulatory Visit: Payer: Medicaid Other | Admitting: Physician Assistant

## 2022-06-08 ENCOUNTER — Ambulatory Visit: Payer: Medicaid Other | Admitting: Physical Therapy

## 2022-06-09 ENCOUNTER — Ambulatory Visit: Payer: Medicaid Other

## 2022-06-14 ENCOUNTER — Ambulatory Visit: Payer: Medicaid Other | Admitting: Physical Therapy

## 2022-06-14 DIAGNOSIS — M6281 Muscle weakness (generalized): Secondary | ICD-10-CM

## 2022-06-14 DIAGNOSIS — G8929 Other chronic pain: Secondary | ICD-10-CM

## 2022-06-14 DIAGNOSIS — R2689 Other abnormalities of gait and mobility: Secondary | ICD-10-CM

## 2022-06-14 DIAGNOSIS — M25562 Pain in left knee: Secondary | ICD-10-CM | POA: Diagnosis not present

## 2022-06-14 NOTE — Therapy (Signed)
OUTPATIENT PHYSICAL THERAPY LOWER EXTREMITY PROGRESS NOTE   Patient Name: Alison Ward MRN: [id number] DOB:1961/07/26, 61 y.o., female Today's Date: 06/14/2022   PT End of Session - 06/14/22 1230     Visit Number 2    Date for PT Re-Evaluation 07/21/22    Authorization Type U[employer] Medicaid    PT Start Time 1231    PT Stop Time 1315    PT Time Calculation (min) 44 min    Activity Tolerance Patient tolerated treatment well             Past Medical History:  Diagnosis Date   Chronic headaches    daily, bitemporal, associated with tingling in face   Heart murmur    Hypertension    Seizures ([place])    partial seizures last one on 10/22/15  On Keppra currently - last seizure 08-2018 x 2 small issues    Stroke ([place]) 07/05/2013   Past Surgical History:  Procedure Laterality Date   ABDOMINAL HYSTERECTOMY N/A 12/15/2015   Procedure: HYSTERECTOMY ABDOMINAL;  Surgeon: Emily Filbert, MD;  Location: [facility];  Service: Gynecology;  Laterality: N/A;   CESAREAN SECTION     ceserean section     COLONOSCOPY     POLYPECTOMY     SALPINGOOPHORECTOMY Bilateral 12/15/2015   Procedure: SALPINGO OOPHORECTOMY;  Surgeon: Emily Filbert, MD;  Location: [facility];  Service: Gynecology;  Laterality: Bilateral;   VENTRICULOSTOMY Left 07/07/2013   Procedure: VENTRICULOSTOMY;  Surgeon: Ophelia Charter, MD;  Location: [facility];  Service: Neurosurgery;  Laterality: Left;  Left Ventriculostomy and Removal of Right Vetriculostomy   Patient Active Problem List   Diagnosis Date Noted   Primary osteoarthritis of left knee 05/19/2022   Lumbar radiculopathy 03/11/2018   Ganglion cyst of right foot 07/04/2017   Chronic tension-type headache, not intractable 12/30/2015   Post-operative state 12/15/2015   Simple partial seizure disorder ([place]) 11/04/2015   Left sided numbness 09/25/2015   Leukopenia 09/25/2015   Tension vascular headache 09/07/2015   Meralgia paresthetica of left side 09/07/2015   Fainting  spell 09/07/2015   Prediabetes 09/02/2015   Stye 08/06/2014   Essential hypertension, benign 04/16/2014   CVA (cerebral vascular accident) ([place]) 04/16/2014   Headache 04/16/2014   Tension headache 03/06/2014   Obesity, unspecified 08/13/2013   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  Pt reports intermittent pain since March 2023.  SUBJECTIVE:   SUBJECTIVE STATEMENT: Yesterday lot of right buttock pain but better today.  Undergoing work up for left TKR.  Trying to work out the timing b/c of her mother's health issues (in [place]).   PERTINENT HISTORY: CVA with right sided weakness in 2017, Dizziness, HTN  PAIN:  Are you having pain? Yes: NPRS scale: 7/10 Pain location: left knee Pain description: aching and sharp Aggravating factors: sit to stand transfer Relieving factors: rest in certain positions  PRECAUTIONS: None  WEIGHT BEARING RESTRICTIONS No  FALLS:  Has patient fallen in last 6  months? No  LIVING ENVIRONMENT: Lives with: lives with their family Lives in: House/apartment Stairs: Yes: Internal: 17 steps; on right going up and External: 1 steps; none Has following equipment at home: None  OCCUPATION: medically disabled following brain surgery  PLOF: Independent and Leisure: traveling, playing with grandchildren, ministering to women and children  PATIENT GOALS:  To be safer and more  independent to not need to rely on others.   OBJECTIVE:   DIAGNOSTIC FINDINGS:  Left knee radiograph on 05/15/22:   IMPRESSION: Severe lateral and patellofemoral compartment osteoarthritis.  PATIENT SURVEYS:  05/30/2022:  LEFS 22 / 80 = 27.5 %  COGNITION:  Overall cognitive status: Within functional limits for tasks assessed     SENSATION: WFL  MUSCLE LENGTH: Tight hamstrings bilaterally with left more impaired than right  PALPATION: Tenderness to palpation over left knee  LOWER EXTREMITY ROM:  Active ROM Right eval Left eval  Hip flexion    Hip extension    Hip abduction    Hip adduction    Hip internal rotation    Hip external rotation    Knee flexion 115 110  Knee extension 0 5  Ankle dorsiflexion    Ankle plantarflexion    Ankle inversion    Ankle eversion     (Blank rows = not tested)  LOWER EXTREMITY MMT:  MMT Right eval Left eval  Hip flexion 4 4-  Hip extension 4 4-  Hip abduction 4 4-  Hip adduction 4 4-  Hip internal rotation    Hip external rotation    Knee flexion 5 4  Knee extension 5 4  Ankle dorsiflexion    Ankle plantarflexion    Ankle inversion    Ankle eversion     (Blank rows = not tested)  LOWER EXTREMITY SPECIAL TESTS:  Knee special tests: Anterior drawer test: negative and Posterior drawer test: negative  FUNCTIONAL TESTS:   05/30/2022 5 times sit to stand: 29.7 sec with use of BUE Timed up and go (TUG): 15.3 sec without assistive device, with some unsteadiness noted with 180 degree turn  GAIT: Distance walked: 100 ft Assistive device utilized: None Level of assistance: Modified independence Comments: Pt with antalgic gait pattern, pt recommend that she may benefit from use of SPC    TODAY'S TREATMENT: 8/23: Patient reports a good understanding of initial HEP At patient's request discussed typical course of rehab following TKR Seated quad set: pushing heels into floor 10x Supine quad set on 1/2 foam roll 10x; heel  slides 10x; SLR 10x (difficult at first but improved with repetition) Sidelying left hip abduction 10x Standing church pew sway 45 sec Retro stepping 45 sec Nu-step 7:79mn L5         05/30/2022 :  reviewed HEP provided. If treatment provided at initial evaluation, no treatment charged due to lack of authorization.        PATIENT EDUCATION:  Education details: Issued HEP Person educated: Patient Education method: Explanation, DMedia planner and Handouts Education comprehension: verbalized understanding and returned demonstration   HOME EXERCISE PROGRAM: Access Code: [email or web address] URL: [email or web address] Date: 06/14/2022 Prepared by: SRuben Im Exercises - Seated Heel Toe Raises  - 1 x daily - 7 x weekly - 2 sets - 10 reps - Seated March  - 1 x daily - 7 x weekly - 2 sets - 10 reps - Seated Long Arc Quad  - 1 x daily - 7 x weekly - 2 sets - 10 reps - Seated Hip Adduction Isometrics with  Ball  - 1 x daily - 7 x weekly - 2 sets - 10 reps - Supine Quad Set  - 1 x daily - 7 x weekly - 1 sets - 10 reps - Supine Heel Slide  - 1 x daily - 7 x weekly - 1 sets - 10 reps - Active Straight Leg Raise with Quad Set  - 1 x daily - 7 x weekly - 1 sets - 10 reps - Sidelying Hip Abduction (Mirrored)  - 1 x daily - 7 x weekly - 1 sets - 10 reps - Church Pew  - 1 x daily - 7 x weekly - 3 sets - 10 reps - Retro Step  - 1 x daily - 7 x weekly - 3 sets - 10 reps   ASSESSMENT:  CLINICAL IMPRESSION: The patient initially had difficulty activating quads but with verbal and tactile cues improves with practice.  Quad lag with SLR (initially caused right buttock pain but this improved with repetition).  Pain with sit to stand from mat table.  Therapist monitoring response and modifying treatment accordingly.     OBJECTIVE IMPAIRMENTS decreased balance, difficulty walking, decreased strength, dizziness, impaired flexibility, and pain.   ACTIVITY LIMITATIONS squatting, stairs, and  transfers  PARTICIPATION LIMITATIONS: cleaning, laundry, and community activity  PERSONAL FACTORS Time since onset of injury/illness/exacerbation and 3+ comorbidities: CVA with right sided weakness, Seizures, HTN  are also affecting patient's functional outcome.   REHAB POTENTIAL: Good  CLINICAL DECISION MAKING: Evolving/moderate complexity  EVALUATION COMPLEXITY: Moderate   GOALS: Goals reviewed with patient? Yes  SHORT TERM GOALS: Target date: 06/20/2022  Pt will be independent with initial HEP. Baseline: Goal status: INITIAL  2.  Pt will improve TUG time to less than 14 seconds to place her at a low risk of falling. Baseline: 15.3 sec Goal status: INITIAL   LONG TERM GOALS: Target date: 07/25/2022   Pt will be independent with advanced HEP. Baseline:  Goal status: INITIAL  2.  Pt will increase LEFS to at least 50% to demonstrate improvements in functional tasks. Baseline: 27.5% Goal status: INITIAL  3.  Pt will increase BLE strength to at least 4+/5 to allow her to negotiate stairs with improved ease. Baseline:  Goal status: INITIAL  4.  Pt will be able to ambulate for at least 15 minutes with LRAD and without reports of increased pain. Baseline:  Goal status: INITIAL    PLAN: PT FREQUENCY: 2x/week  PT DURATION: 8 weeks  PLANNED INTERVENTIONS: Therapeutic exercises, Therapeutic activity, Neuromuscular re-education, Balance training, Gait training, Patient/Family education, Self Care, Joint mobilization, Joint manipulation, Stair training, Vestibular training, Canalith repositioning, Aquatic Therapy, Dry Needling, Electrical stimulation, Cryotherapy, Moist heat, Taping, Ultrasound, Ionotophoresis '4mg'$ /ml Dexamethasone, Manual therapy, and Re-evaluation  PLAN FOR NEXT SESSION: assess and progress HEP as indicated, strengthening, flexibility   Ruben Im, PT 06/14/22 4:52 PM Phone: [phone] Fax: [phone]  [facility] [address] [place] [address] [place], [place] [address] Phone # [phone] Fax [phone]

## 2022-06-16 ENCOUNTER — Ambulatory Visit: Payer: Medicaid Other | Admitting: Rehabilitative and Restorative Service Providers"

## 2022-06-19 ENCOUNTER — Telehealth: Payer: Self-pay | Admitting: Family Medicine

## 2022-06-19 NOTE — Telephone Encounter (Signed)
Copied from [place] [id number]. Topic: General - Other >> Jun 19, 2022  8:54 AM Ludger Nutting wrote: Patient called and said her ortho dr is waiting on surgical clearance from Dr. Margarita Rana. Please follow up with patient.

## 2022-06-19 NOTE — Telephone Encounter (Signed)
Fax was originally faxed on 06/05/2022 form has been re faxed to office.

## 2022-06-21 ENCOUNTER — Ambulatory Visit: Payer: Medicaid Other | Admitting: Rehabilitative and Restorative Service Providers"

## 2022-06-23 ENCOUNTER — Ambulatory Visit: Payer: Medicaid Other | Admitting: Physical Therapy

## 2022-06-23 DIAGNOSIS — M25562 Pain in left knee: Secondary | ICD-10-CM | POA: Diagnosis not present

## 2022-06-23 DIAGNOSIS — G8929 Other chronic pain: Secondary | ICD-10-CM

## 2022-06-23 DIAGNOSIS — R2689 Other abnormalities of gait and mobility: Secondary | ICD-10-CM

## 2022-06-23 DIAGNOSIS — M6281 Muscle weakness (generalized): Secondary | ICD-10-CM

## 2022-06-23 NOTE — Therapy (Signed)
OUTPATIENT PHYSICAL THERAPY LOWER EXTREMITY PROGRESS NOTE   Patient Name: Alison Ward MRN: [id number] DOB:04/26/1961, 61 y.o., female Today's Date: 06/23/2022   PT End of Session - 06/23/22 1020     Visit Number 3    Date for PT Re-Evaluation 07/21/22    Authorization Type UHC Medicaid    PT Start Time 2683    PT Stop Time 1053    PT Time Calculation (min) 38 min    Activity Tolerance Patient tolerated treatment well             Past Medical History:  Diagnosis Date   Chronic headaches    daily, bitemporal, associated with tingling in face   Heart murmur    Hypertension    Seizures ([place])    partial seizures last one on 10/22/15  On Keppra currently - last seizure 08-2018 x 2 small issues    Stroke ([place]) 07/05/2013   Past Surgical History:  Procedure Laterality Date   ABDOMINAL HYSTERECTOMY N/A 12/15/2015   Procedure: HYSTERECTOMY ABDOMINAL;  Surgeon: Emily Filbert, MD;  Location: [facility];  Service: Gynecology;  Laterality: N/A;   CESAREAN SECTION     ceserean section     COLONOSCOPY     POLYPECTOMY     SALPINGOOPHORECTOMY Bilateral 12/15/2015   Procedure: SALPINGO OOPHORECTOMY;  Surgeon: Emily Filbert, MD;  Location: [facility];  Service: Gynecology;  Laterality: Bilateral;   VENTRICULOSTOMY Left 07/07/2013   Procedure: VENTRICULOSTOMY;  Surgeon: Ophelia Charter, MD;  Location: [facility];  Service: Neurosurgery;  Laterality: Left;  Left Ventriculostomy and Removal of Right Vetriculostomy   Patient Active Problem List   Diagnosis Date Noted   Primary osteoarthritis of left knee 05/19/2022   Lumbar radiculopathy 03/11/2018   Ganglion cyst of right foot 07/04/2017   Chronic tension-type headache, not intractable 12/30/2015   Post-operative state 12/15/2015   Simple partial seizure disorder ([place]) 11/04/2015   Left sided numbness 09/25/2015   Leukopenia 09/25/2015   Tension vascular headache 09/07/2015   Meralgia paresthetica of left side 09/07/2015   Fainting  spell 09/07/2015   Prediabetes 09/02/2015   Stye 08/06/2014   Essential hypertension, benign 04/16/2014   CVA (cerebral vascular accident) ([place]) 04/16/2014   Headache 04/16/2014   Tension headache 03/06/2014   Obesity, unspecified 08/13/2013   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  Pt reports intermittent pain since March 2023.  SUBJECTIVE:   SUBJECTIVE STATEMENT: It's 50/50.  There is a pinch in my hip.  Going to [place] next week.  Her mother has an infection.    PERTINENT HISTORY: CVA with right sided weakness in 2017, Dizziness, HTN  PAIN:  Are you having pain? Yes: NPRS scale: 7/10 Pain location: right hip; left knee Pain description: aching and sharp Aggravating factors: sit to stand transfer Relieving factors: rest in certain positions  PRECAUTIONS: None  WEIGHT BEARING RESTRICTIONS No  FALLS:  Has patient fallen in last 6 months? No  LIVING ENVIRONMENT: Lives  with: lives with their family Lives in: House/apartment Stairs: Yes: Internal: 17 steps; on right going up and External: 1 steps; none Has following equipment at home: None  OCCUPATION: medically disabled following brain surgery  PLOF: Independent and Leisure: traveling, playing with grandchildren, ministering to women and children  PATIENT GOALS:  To be safer and more independent to not need to rely on others.   OBJECTIVE:    DIAGNOSTIC FINDINGS:  Left knee radiograph on 05/15/22:   IMPRESSION: Severe lateral and patellofemoral compartment osteoarthritis.  PATIENT SURVEYS:  05/30/2022:  LEFS 22 / 80 = 27.5 %  COGNITION:  Overall cognitive status: Within functional limits for tasks assessed     SENSATION: WFL  MUSCLE LENGTH: Tight hamstrings bilaterally with left more impaired than right  PALPATION: Tenderness to palpation over left knee  LOWER EXTREMITY ROM:  Active ROM Right eval Left eval  Hip flexion    Hip extension    Hip abduction    Hip adduction    Hip internal rotation    Hip external rotation    Knee flexion 115 110  Knee extension 0 5  Ankle dorsiflexion    Ankle plantarflexion    Ankle inversion    Ankle eversion     (Blank rows = not tested)  LOWER EXTREMITY MMT:  MMT Right eval Left eval  Hip flexion 4 4-  Hip extension 4 4-  Hip abduction 4 4-  Hip adduction 4 4-  Hip internal rotation    Hip external rotation    Knee flexion 5 4  Knee extension 5 4  Ankle dorsiflexion    Ankle plantarflexion    Ankle inversion    Ankle eversion     (Blank rows = not tested)  LOWER EXTREMITY SPECIAL TESTS:  Knee special tests: Anterior drawer test: negative and Posterior drawer test: negative  FUNCTIONAL TESTS:   05/30/2022 5 times sit to stand: 29.7 sec with use of BUE Timed up and go (TUG): 15.3 sec without assistive device, with some unsteadiness noted with 180 degree turn  GAIT: Distance walked: 100 ft Assistive device utilized: None Level of assistance: Modified independence Comments: Pt with antalgic gait pattern, pt recommend that she may benefit from use of SPC    TODAY'S TREATMENT: 8/25: Nu-Step L4 3 min (painful today) Manual therapy: Addaday soft tissue mobilization to bil quads ; right hip mobs grade  2/3 distraction, inferior 10x each Green strap : left knee assisted flexion 10x, HS stretch right/left Sidelying clam 10x right/left Supine clam with  green band 10x  Pendulum swings 5# off 4 inch step 20x right/left   8/23: Patient reports a good understanding of initial HEP At patient's request discussed typical course of rehab following TKR Seated quad set: pushing heels into floor 10x Supine quad set on 1/2 foam roll 10x; heel slides 10x; SLR 10x (difficult at first but improved with repetition) Sidelying left hip abduction 10x Standing church pew sway 45 sec Retro stepping 45 sec Nu-step 7:21mn L5         05/30/2022 :  reviewed HEP provided. If treatment provided at initial evaluation, no treatment charged due to lack of authorization.        PATIENT EDUCATION:  Education details: Issued HEP Person educated: Patient Education method: Explanation, DMedia planner and Handouts Education comprehension: verbalized understanding and returned demonstration   HOME EXERCISE PROGRAM: Access Code: [email or web address] URL: [email or web address] Date: 06/14/2022 Prepared by: SRuben Im Exercises - Seated Heel Toe Raises  - 1 x daily -  7 x weekly - 2 sets - 10 reps - Seated March  - 1 x daily - 7 x weekly - 2 sets - 10 reps - Seated Long Arc Quad  - 1 x daily - 7 x weekly - 2 sets - 10 reps - Seated Hip Adduction Isometrics with Ball  - 1 x daily - 7 x weekly - 2 sets - 10 reps - Supine Quad Set  - 1 x daily - 7 x weekly - 1 sets - 10 reps - Supine Heel Slide  - 1 x daily - 7 x weekly - 1 sets - 10 reps - Active Straight Leg Raise with Quad Set  - 1 x daily - 7 x weekly - 1 sets - 10 reps - Sidelying Hip Abduction (Mirrored)  - 1 x daily - 7 x weekly - 1 sets - 10 reps - Church Pew  - 1 x daily - 7 x weekly - 3 sets - 10 reps - Retro Step  - 1 x daily - 7 x weekly - 3 sets - 10 reps   ASSESSMENT:  CLINICAL IMPRESSION: Considerably more right hip and left knee pain compared to last visit.  She has had a lot of family health issues and the stress of that may be a contributing factor.  Treatment focus on pain  relieving interventions.  Therapist monitoring response and modifying treatment accordingly.    OBJECTIVE IMPAIRMENTS decreased balance, difficulty walking, decreased strength, dizziness, impaired flexibility, and pain.   ACTIVITY LIMITATIONS squatting, stairs, and transfers  PARTICIPATION LIMITATIONS: cleaning, laundry, and community activity  PERSONAL FACTORS Time since onset of injury/illness/exacerbation and 3+ comorbidities: CVA with right sided weakness, Seizures, HTN  are also affecting patient's functional outcome.   REHAB POTENTIAL: Good  CLINICAL DECISION MAKING: Evolving/moderate complexity  EVALUATION COMPLEXITY: Moderate   GOALS: Goals reviewed with patient? Yes  SHORT TERM GOALS: Target date: 06/20/2022  Pt will be independent with initial HEP. Baseline: Goal status: goal met 8/25  2.  Pt will improve TUG time to less than 14 seconds to place her at a low risk of falling. Baseline: 15.3 sec Goal status: ongoing   LONG TERM GOALS: Target date: 07/25/2022   Pt will be independent with advanced HEP. Baseline:  Goal status: INITIAL  2.  Pt will increase LEFS to at least 50% to demonstrate improvements in functional tasks. Baseline: 27.5% Goal status: INITIAL  3.  Pt will increase BLE strength to at least 4+/5 to allow her to negotiate stairs with improved ease. Baseline:  Goal status: INITIAL  4.  Pt will be able to ambulate for at least 15 minutes with LRAD and without reports of increased pain. Baseline:  Goal status: INITIAL    PLAN: PT FREQUENCY: 2x/week  PT DURATION: 8 weeks  PLANNED INTERVENTIONS: Therapeutic exercises, Therapeutic activity, Neuromuscular re-education, Balance training, Gait training, Patient/Family education, Self Care, Joint mobilization, Joint manipulation, Stair training, Vestibular training, Canalith repositioning, Aquatic Therapy, Dry Needling, Electrical stimulation, Cryotherapy, Moist heat, Taping, Ultrasound,  Ionotophoresis 7m/ml Dexamethasone, Manual therapy, and Re-evaluation  PLAN FOR NEXT SESSION: do TUG test next visit for STG; assess and progress HEP as indicated, strengthening, flexibility  SRuben Im PT 06/23/22 10:51 AM Phone: [phone]: [phone] [place] [place] [address]Phone # [phone] [phone]

## 2022-06-27 ENCOUNTER — Encounter: Payer: Medicaid Other | Admitting: Rehabilitative and Restorative Service Providers"

## 2022-06-29 ENCOUNTER — Ambulatory Visit: Payer: Medicaid Other

## 2022-06-29 ENCOUNTER — Other Ambulatory Visit: Payer: Self-pay | Admitting: Family Medicine

## 2022-06-29 ENCOUNTER — Ambulatory Visit: Payer: Medicaid Other | Admitting: Physical Therapy

## 2022-06-29 DIAGNOSIS — I10 Essential (primary) hypertension: Secondary | ICD-10-CM

## 2022-06-29 DIAGNOSIS — E7800 Pure hypercholesterolemia, unspecified: Secondary | ICD-10-CM

## 2022-07-04 ENCOUNTER — Ambulatory Visit: Payer: Medicaid Other | Admitting: Rehabilitative and Restorative Service Providers"

## 2022-07-05 ENCOUNTER — Ambulatory Visit: Payer: Medicaid Other | Admitting: Family Medicine

## 2022-07-06 ENCOUNTER — Encounter: Payer: Medicaid Other | Admitting: Rehabilitative and Restorative Service Providers"

## 2022-07-10 ENCOUNTER — Encounter: Payer: Medicaid Other | Admitting: Rehabilitative and Restorative Service Providers"

## 2022-07-10 ENCOUNTER — Ambulatory Visit: Payer: Medicaid Other | Admitting: Family Medicine

## 2022-07-13 ENCOUNTER — Encounter: Payer: Medicaid Other | Admitting: Physical Therapy

## 2022-07-18 ENCOUNTER — Encounter: Payer: Medicaid Other | Admitting: Rehabilitative and Restorative Service Providers"

## 2022-07-18 ENCOUNTER — Ambulatory Visit: Payer: Medicaid Other

## 2022-07-20 ENCOUNTER — Ambulatory Visit: Payer: Medicaid Other | Admitting: Rehabilitative and Restorative Service Providers"

## 2022-07-25 ENCOUNTER — Encounter: Payer: Self-pay | Admitting: Rehabilitative and Restorative Service Providers"

## 2022-07-25 ENCOUNTER — Ambulatory Visit: Payer: Medicaid Other | Attending: Family Medicine | Admitting: Rehabilitative and Restorative Service Providers"

## 2022-07-25 DIAGNOSIS — M6281 Muscle weakness (generalized): Secondary | ICD-10-CM | POA: Insufficient documentation

## 2022-07-25 DIAGNOSIS — M25562 Pain in left knee: Secondary | ICD-10-CM | POA: Diagnosis present

## 2022-07-25 DIAGNOSIS — R2689 Other abnormalities of gait and mobility: Secondary | ICD-10-CM | POA: Insufficient documentation

## 2022-07-25 DIAGNOSIS — R42 Dizziness and giddiness: Secondary | ICD-10-CM | POA: Diagnosis present

## 2022-07-25 DIAGNOSIS — G8929 Other chronic pain: Secondary | ICD-10-CM | POA: Insufficient documentation

## 2022-07-25 NOTE — Therapy (Signed)
OUTPATIENT PHYSICAL THERAPY LOWER EXTREMITY PROGRESS NOTE   Patient Name: Alison Ward MRN: [id number] DOB:28-Dec-1960, 61 y.o., female Today's Date: 07/25/2022   PT End of Session - 07/25/22 1401     Visit Number 4    Date for PT Re-Evaluation 09/15/22    Authorization Type [employer] Medicaid    PT Start Time 1400    PT Stop Time 1440    PT Time Calculation (min) 40 min    Activity Tolerance Patient tolerated treatment well    Behavior During Therapy WFL for tasks assessed/performed             Past Medical History:  Diagnosis Date   Chronic headaches    daily, bitemporal, associated with tingling in face   Heart murmur    Hypertension    Seizures ([place])    partial seizures last one on 10/22/15  On Keppra currently - last seizure 08-2018 x 2 small issues    Stroke ([place]) 07/05/2013   Past Surgical History:  Procedure Laterality Date   ABDOMINAL HYSTERECTOMY N/A 12/15/2015   Procedure: HYSTERECTOMY ABDOMINAL;  Surgeon: Emily Filbert, MD;  Location: [facility];  Service: Gynecology;  Laterality: N/A;   CESAREAN SECTION     ceserean section     COLONOSCOPY     POLYPECTOMY     SALPINGOOPHORECTOMY Bilateral 12/15/2015   Procedure: SALPINGO OOPHORECTOMY;  Surgeon: Emily Filbert, MD;  Location: [facility];  Service: Gynecology;  Laterality: Bilateral;   VENTRICULOSTOMY Left 07/07/2013   Procedure: VENTRICULOSTOMY;  Surgeon: Ophelia Charter, MD;  Location: [facility];  Service: Neurosurgery;  Laterality: Left;  Left Ventriculostomy and Removal of Right Vetriculostomy   Patient Active Problem List   Diagnosis Date Noted   Primary osteoarthritis of left knee 05/19/2022   Lumbar radiculopathy 03/11/2018   Ganglion cyst of right foot 07/04/2017   Chronic tension-type headache, not intractable 12/30/2015   Post-operative state 12/15/2015   Simple partial seizure disorder ([place]) 11/04/2015   Left sided numbness 09/25/2015   Leukopenia 09/25/2015   Tension vascular headache 09/07/2015    Meralgia paresthetica of left side 09/07/2015   Fainting spell 09/07/2015   Prediabetes 09/02/2015   Stye 08/06/2014   Essential hypertension, benign 04/16/2014   CVA (cerebral vascular accident) ([place]) 04/16/2014   Headache 04/16/2014   Tension headache 03/06/2014   Obesity, unspecified 08/13/2013   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   ICH (intracerebral hemorrhage) ([place]) 07/21/2013   Hypokalemia 07/21/2013   Intracerebral hemorrhage ([place]) 07/06/2013   Essential hypertension 02/24/2009    PCP: Charlott Rakes, MD   REFERRING PROVIDER: Charlott Rakes, MD   REFERRING DIAG: [phone] (ICD-10-CM) - Chronic pain of left knee M79.651,M79.652 (ICD-10-CM) - Pain in both thighs   THERAPY DIAG:  Chronic pain of left knee - Plan: PT plan of care cert/re-cert  Other abnormalities of gait and mobility - Plan: PT plan of care cert/re-cert  Muscle weakness (generalized) - Plan: PT plan of care cert/re-cert  Dizziness and giddiness - Plan: PT plan of care cert/re-cert  Rationale for Evaluation and Treatment Rehabilitation  ONSET DATE: 05/15/2022 order received.  Pt reports intermittent pain since March 2023.  SUBJECTIVE:   SUBJECTIVE STATEMENT:  Pt states that her right leg seems to be hurting her recently more than her left.  She report returning from [place] last week after caring for her mother.  PERTINENT HISTORY: CVA with right sided weakness in 2017, Dizziness, HTN  PAIN:  Are you having pain? Yes: NPRS  scale: 4/10 Pain location: right knee/hip; left knee Pain description: aching and sharp Aggravating factors: sit to stand transfer Relieving factors: rest in certain positions  PRECAUTIONS: None  WEIGHT BEARING RESTRICTIONS No  FALLS:  Has patient fallen in last 6 months? 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   OBJECTIVE:   DIAGNOSTIC FINDINGS:  Left knee radiograph on 05/15/22:   IMPRESSION: Severe lateral and patellofemoral compartment osteoarthritis.  PATIENT SURVEYS:  05/30/2022:  LEFS 22 / 80 = 27.5 % 07/25/2022:  Lower Extremity Functional Score: 26 / 80 = 32.5 %  COGNITION:  Overall cognitive status: Within functional limits for tasks assessed     SENSATION: WFL  MUSCLE LENGTH: Tight hamstrings bilaterally with left more impaired than right  PALPATION: Tenderness to palpation over left knee  LOWER EXTREMITY ROM:  05/30/2022: Right knee 0 to 115 degrees Left knee 5 to 110 degrees  07/25/2022: Right knee 0 to 117 degrees Left knee 3 to 112 degrees  LOWER EXTREMITY MMT:  05/30/2022: Right hip 4/5, Right knee 5/5 Left hip 4-/5, left knee 4/5  07/25/2022: Right hip 4 to 4+/5, Right knee 5/5 Left hip 4/5, left knee 4+/5   LOWER EXTREMITY SPECIAL TESTS:  05/30/2022:  Knee special tests: Anterior drawer test: negative and Posterior drawer test: negative  FUNCTIONAL TESTS:   05/30/2022 5 times sit to stand: 29.7 sec with use of BUE Timed up and go (TUG): 15.3 sec without assistive device, with some unsteadiness noted with 180 degree turn  07/25/2022: 5 times sit to stand: 19.6 sec without use of BUE Timed up and go (TUG): 12.9 sec without assistive device 3 min walk test:  523 ft  GAIT: Distance walked: 100 ft Assistive device utilized: None Level of assistance: Modified independence Comments: Pt with antalgic gait pattern, pt recommend that she may benefit from use of SPC    TODAY'S TREATMENT: 07/25/2022: Nustep level 5 x6 min with PT present to discuss status LEFS, TUG, 5 times sit to/from  stand, A/ROM Seated with 2.5# ankle weights:  heel/toe raises, marching, LAQ. 2x10 bilat Seated hip abduction and hamstring curls with red tband 2x10 each bilat Sit to/from stand x10 reps 3 min ambulation around PT clinic without assistive device   8/25: Nu-Step L4 3 min (painful today) Manual therapy: Addaday soft tissue mobilization to bil quads ; right hip mobs grade  2/3 distraction, inferior 10x each Green strap : left knee assisted flexion 10x, HS stretch right/left Sidelying clam 10x right/left Supine clam with green band 10x  Pendulum swings 5# off 4 inch step 20x right/left   8/23: Patient reports a good understanding of initial HEP At patient's request discussed typical course of rehab following TKR Seated quad set: pushing heels into floor 10x Supine quad set on 1/2 foam roll 10x; heel slides 10x; SLR 10x (difficult at first but improved with repetition) Sidelying left hip abduction 10x Standing church pew sway 45 sec Retro stepping 45 sec Nu-step 7:24mn L5        PATIENT EDUCATION:  Education details: Issued HEP Person educated: Patient Education method: Explanation, DMedia planner and Handouts Education comprehension: verbalized  understanding and returned demonstration   HOME EXERCISE PROGRAM: Access Code: [email or web address] URL: [email or web address] Date: 06/14/2022 Prepared by: Ruben Im  Exercises - Seated Heel Toe Raises  - 1 x daily - 7 x weekly - 2 sets - 10 reps - Seated March  - 1 x daily - 7 x weekly - 2 sets - 10 reps - Seated Long Arc Quad  - 1 x daily - 7 x weekly - 2 sets - 10 reps - Seated Hip Adduction Isometrics with Ball  - 1 x daily - 7 x weekly - 2 sets - 10 reps - Supine Quad Set  - 1 x daily - 7 x weekly - 1 sets - 10 reps - Supine Heel Slide  - 1 x daily - 7 x weekly - 1 sets - 10 reps - Active Straight Leg Raise with Quad Set  - 1 x daily - 7 x weekly - 1 sets - 10 reps - Sidelying Hip Abduction (Mirrored)  - 1 x daily -  7 x weekly - 1 sets - 10 reps - Church Pew  - 1 x daily - 7 x weekly - 3 sets - 10 reps - Retro Step  - 1 x daily - 7 x weekly - 3 sets - 10 reps   ASSESSMENT:  CLINICAL IMPRESSION: Ms Manus presents to skilled PT after a hiatus secondary to being out of town caring for her mother.  Pt has made progress towards all goals, but has not yet met long term goals.  Pt reports that she is able to walk for about 5 minutes before her legs start hurting her more and she gets uncomfortable.  Pt does continue to state some impairments with increased dizziness, such as when she is getting out of bed and may benefit from a vestibular evaluation in future visits if this progresses with MD signature on certification indicating agreement for vestibular treatment if dizziness continues.  Pt has not yet all goals secondary to a month delay in her therapy, but she did continue with her HEP.  Pt would benefit from an additional 1-2x/week for 8 weeks to continue to progress towards goal related activities.  OBJECTIVE IMPAIRMENTS decreased balance, difficulty walking, decreased strength, dizziness, impaired flexibility, and pain.   ACTIVITY LIMITATIONS squatting, stairs, and transfers  PARTICIPATION LIMITATIONS: cleaning, laundry, and community activity  PERSONAL FACTORS Time since onset of injury/illness/exacerbation and 3+ comorbidities: CVA with right sided weakness, Seizures, HTN  are also affecting patient's functional outcome.   REHAB POTENTIAL: Good  CLINICAL DECISION MAKING: Evolving/moderate complexity  EVALUATION COMPLEXITY: Moderate   GOALS: Goals reviewed with patient? Yes  SHORT TERM GOALS: Target date: 06/20/2022  Pt will be independent with initial HEP. Baseline: Goal status: goal met 8/25  2.  Pt will improve TUG time to less than 14 seconds to place her at a low risk of falling. Baseline: 15.3 sec Goal status: MET 07/25/2022   LONG TERM GOALS: Target date: 09/15/2022   Pt will be  independent with advanced HEP. Baseline:  Goal status: IN PROGRESS  2.  Pt will increase LEFS to at least 50% to demonstrate improvements in functional tasks. Baseline: 27.5% Goal status: IN PROGRESS (see above)  3.  Pt will increase BLE strength to at least 4+/5 to allow her to negotiate stairs with improved ease. Baseline:  Goal status: IN PROGRESS (see above)  4.  Pt will be able to ambulate for at least 15-20 minutes with LRAD and without reports of increased pain.  Baseline:  Goal status: IN PROGRESS  5.  Pt will report at least an 80% improvement in dizziness symptoms and deny dizziness when lying in bed and rolling over. Baseline:  on 07/25/22, pt reports some dizziness in bed Goal status:  INITIAL    PLAN: PT FREQUENCY: 1-2x/week  PT DURATION: 8 weeks  PLANNED INTERVENTIONS: Therapeutic exercises, Therapeutic activity, Neuromuscular re-education, Balance training, Gait training, Patient/Family education, Self Care, Joint mobilization, Joint manipulation, Stair training, Vestibular training, Canalith repositioning, Aquatic Therapy, Dry Needling, Electrical stimulation, Cryotherapy, Moist heat, Taping, Ultrasound, Ionotophoresis 27m/ml Dexamethasone, Manual therapy, and Re-evaluation  PLAN FOR NEXT SESSION:  Assess and progress HEP as indicated, strengthening, flexibility, balance   SJuel Burrow PT 07/25/22 3:56 PM  [facility] [address] [place] [address]Phone # [phone] [phone]

## 2022-07-27 ENCOUNTER — Ambulatory Visit
Admission: RE | Admit: 2022-07-27 | Discharge: 2022-07-27 | Disposition: A | Discharge status: Home / Self Care | Payer: Medicaid Other | Source: Ambulatory Visit | Attending: Family Medicine | Admitting: Family Medicine

## 2022-07-27 ENCOUNTER — Encounter: Payer: Self-pay | Admitting: Family Medicine

## 2022-07-27 ENCOUNTER — Ambulatory Visit: Payer: Medicaid Other | Attending: Family Medicine | Admitting: Family Medicine

## 2022-07-27 VITALS — BP 116/78 | HR 75 | Temp 97.8°F | Ht 68.0 in | Wt 250.8 lb

## 2022-07-27 DIAGNOSIS — E7800 Pure hypercholesterolemia, unspecified: Secondary | ICD-10-CM

## 2022-07-27 DIAGNOSIS — Z23 Encounter for immunization: Secondary | ICD-10-CM | POA: Diagnosis not present

## 2022-07-27 DIAGNOSIS — G5712 Meralgia paresthetica, left lower limb: Secondary | ICD-10-CM | POA: Diagnosis not present

## 2022-07-27 DIAGNOSIS — R42 Dizziness and giddiness: Secondary | ICD-10-CM

## 2022-07-27 DIAGNOSIS — I10 Essential (primary) hypertension: Secondary | ICD-10-CM

## 2022-07-27 DIAGNOSIS — G8929 Other chronic pain: Secondary | ICD-10-CM

## 2022-07-27 DIAGNOSIS — M25511 Pain in right shoulder: Secondary | ICD-10-CM

## 2022-07-27 MED ORDER — LISINOPRIL-HYDROCHLOROTHIAZIDE 20-25 MG PO TABS: 1.0000 | ORAL_TABLET | Freq: Every day | ORAL | 0 refills | Status: DC

## 2022-07-27 MED ORDER — ATORVASTATIN CALCIUM 40 MG PO TABS: 40.0000 mg | ORAL_TABLET | Freq: Every day | ORAL | 0 refills | Status: DC

## 2022-07-27 MED ORDER — MECLIZINE HCL 25 MG PO TABS: 25.0000 mg | ORAL_TABLET | Freq: Three times a day (TID) | ORAL | 0 refills | Status: DC | PRN

## 2022-07-27 MED ORDER — GABAPENTIN 600 MG PO TABS: 600.0000 mg | ORAL_TABLET | Freq: Three times a day (TID) | ORAL | 1 refills | Status: DC

## 2022-07-27 NOTE — Patient Instructions (Signed)

## 2022-07-27 NOTE — Progress Notes (Signed)
Subjective:  Patient ID: Alison Ward, female    DOB: 1960-12-01  Age: 61 y.o. MRN: [id number]  CC: Hypertension   HPI Alila Sotero is a 61 y.o. year old female with a history of hypertension, chronic headaches, previous history of right  Hemorrhagic stroke status post ventriculostomy in 06/2013, Prediabetes (A1c 5.9), meralgia paresthetica, hyperlipidemia  Interval History:  She complains of right shoulder pain which started a couple of months after she lifted her Mom and pain is rated as severe and worse with truning to her right and putting on her clothes. She is right hand dominant.  Denies presence of numbness in extremities. Currently undergoing physical therapy for her left knee pain and is being worked up by orthopedics for possible surgery possibly early next year.  Doing well on her antihypertensive and statin. She continues to remain on gabapentin for her meralgia paresthetica. She has been on Keppra which has been prescribed by neurology.  Past Medical History:  Diagnosis Date   Chronic headaches    daily, bitemporal, associated with tingling in face   Heart murmur    Hypertension    Seizures ([place])    partial seizures last one on 10/22/15  On Keppra currently - last seizure 08-2018 x 2 small issues    Stroke ([place]) 07/05/2013    Past Surgical History:  Procedure Laterality Date   ABDOMINAL HYSTERECTOMY N/A 12/15/2015   Procedure: HYSTERECTOMY ABDOMINAL;  Surgeon: Emily Filbert, MD;  Location: [facility];  Service: Gynecology;  Laterality: N/A;   CESAREAN SECTION     ceserean section     COLONOSCOPY     POLYPECTOMY     SALPINGOOPHORECTOMY Bilateral 12/15/2015   Procedure: SALPINGO OOPHORECTOMY;  Surgeon: Emily Filbert, MD;  Location: [facility];  Service: Gynecology;  Laterality: Bilateral;   VENTRICULOSTOMY Left 07/07/2013   Procedure: VENTRICULOSTOMY;  Surgeon: Ophelia Charter, MD;  Location: [facility];  Service: Neurosurgery;  Laterality: Left;  Left  Ventriculostomy and Removal of Right Vetriculostomy    Family History  Problem Relation Age of Onset   Hypertension Mother    Diabetes Mother    Stroke Mother    Hypertension Father    Colon cancer Neg Hx    Breast cancer Neg Hx    Colon polyps Neg Hx    Esophageal cancer Neg Hx    Rectal cancer Neg Hx    Stomach cancer Neg Hx     Social History   Socioeconomic History   Marital status: Divorced    Spouse name: Not on file   Number of children: 3   Years of education: college   Highest education level: Not on file  Occupational History   Occupation: [employer]  Tobacco Use   Smoking status: Never   Smokeless tobacco: Never  Vaping Use   Vaping Use: Never used  Substance and Sexual Activity   Alcohol use: No   Drug use: No   Sexual activity: Not on file  Other Topics Concern   Not on file  Social History Narrative   Not on file   Social Determinants of Health   Financial Resource Strain: Not on file  Food Insecurity: Not on file  Transportation Needs: Not on file  Physical Activity: Not on file  Stress: Not on file  Social Connections: Not on file    Allergies  Allergen Reactions   Elavil [Amitriptyline] Other (See Comments)    DIZZINESS   Topiramate Er Other (See Comments)  Suicidal Thoughts- [place]    Outpatient Medications Prior to Visit  Medication Sig Dispense Refill   aspirin EC 81 MG tablet Take 1 tablet (81 mg total) by mouth daily. 30 tablet 1   levETIRAcetam (KEPPRA XR) 500 MG 24 hr tablet Take 1 tablet (500 mg total) by mouth at bedtime. 90 tablet 3   atorvastatin (LIPITOR) 40 MG tablet TAKE 1 TABLET BY MOUTH EVERY DAY 90 tablet 0   gabapentin (NEURONTIN) 600 MG tablet Take 1 tablet (600 mg total) by mouth 3 (three) times daily. 270 tablet 1   lisinopril-hydrochlorothiazide (ZESTORETIC) 20-25 MG tablet TAKE 1 TABLET BY MOUTH EVERY DAY 90 tablet 0   meclizine (ANTIVERT) 25 MG tablet Take 1 tablet (25 mg total) by mouth 3 (three)  times daily as needed for dizziness. 30 tablet 0   No facility-administered medications prior to visit.     ROS Review of Systems  Constitutional:  Negative for activity change and appetite change.  HENT:  Negative for sinus pressure and sore throat.   Respiratory:  Negative for chest tightness, shortness of breath and wheezing.   Cardiovascular:  Negative for chest pain and palpitations.  Gastrointestinal:  Negative for abdominal distention, abdominal pain and constipation.  Genitourinary: Negative.   Musculoskeletal:        See HPI  Psychiatric/Behavioral:  Negative for behavioral problems and dysphoric mood.     Objective:  BP 116/78   Pulse 75   Temp 97.8 F (36.6 C) (Oral)   Ht 5' 8"  (1.727 m)   Wt 250 lb 12.8 oz (113.8 kg)   LMP 11/24/2011   SpO2 97%   BMI 38.13 kg/m      07/27/2022   10:28 AM 05/24/2022   11:19 AM 05/15/2022   10:20 AM  BP/Weight  Systolic BP 604 540 981  Diastolic BP 78 73 72  Wt. (Lbs) 250.8 252 251.2  BMI 38.13 kg/m2 38.32 kg/m2 38.19 kg/m2      Physical Exam Constitutional:      Appearance: She is well-developed.  Cardiovascular:     Rate and Rhythm: Normal rate.     Heart sounds: Normal heart sounds. No murmur heard. Pulmonary:     Effort: Pulmonary effort is normal.     Breath sounds: Normal breath sounds. No wheezing or rales.  Chest:     Chest wall: No tenderness.  Abdominal:     General: Bowel sounds are normal. There is no distension.     Palpations: Abdomen is soft. There is no mass.     Tenderness: There is no abdominal tenderness.  Musculoskeletal:        General: Normal range of motion.     Right lower leg: No edema.     Left lower leg: No edema.     Comments: Tenderness on palpation of posterior lateral aspect of right shoulder joint Positive Neer's sign Handgrip is slightly reduced on the right but normal on the left    Neurological:     Mental Status: She is alert and oriented to person, place, and time.   Psychiatric:        Mood and Affect: Mood normal.        Latest Ref Rng & Units 01/02/2022    4:45 PM 01/20/2020    8:41 AM 05/06/2019   11:36 AM  CMP  Glucose 70 - 99 mg/dL 85  84  92   BUN 8 - 27 mg/dL 16  11  14    Creatinine 0.57 -  1.00 mg/dL 0.87  0.96  1.03   Sodium 134 - 144 mmol/L 148  144  142   Potassium 3.5 - 5.2 mmol/L 4.3  3.8  3.4   Chloride 96 - 106 mmol/L 105  101  99   CO2 20 - 29 mmol/L 26  26  27    Calcium 8.7 - 10.3 mg/dL 9.7  9.5  9.8   Total Protein 6.0 - 8.5 g/dL 7.3  7.1  7.3   Total Bilirubin 0.0 - 1.2 mg/dL 0.9  1.0  1.1   Alkaline Phos 44 - 121 IU/L 67  66  67   AST 0 - 40 IU/L 22  23  24    ALT 0 - 32 IU/L 12  15  17      Lipid Panel     Component Value Date/Time   CHOL 138 01/20/2020 0841   TRIG 174 (H) 01/20/2020 0841   HDL 37 (L) 01/20/2020 0841   CHOLHDL 3.7 01/20/2020 0841   CHOLHDL 6.4 (H) 12/06/2016 1012   VLDL 35 (H) 12/06/2016 1012   LDLCALC 71 01/20/2020 0841    CBC    Component Value Date/Time   WBC 4.0 12/06/2016 1012   RBC 4.59 12/06/2016 1012   HGB 12.8 12/06/2016 1012   HCT 39.7 12/06/2016 1012   PLT 267 12/06/2016 1012   MCV 86.5 12/06/2016 1012   MCH 27.9 12/06/2016 1012   MCHC 32.2 12/06/2016 1012   RDW 16.1 (H) 12/06/2016 1012   LYMPHSABS 1,600 12/06/2016 1012   MONOABS 400 12/06/2016 1012   EOSABS 80 12/06/2016 1012   BASOSABS 40 12/06/2016 1012    Lab Results  Component Value Date   HGBA1C 5.8 01/02/2022    Assessment & Plan:  1. Chronic right shoulder pain Uncontrolled She does have Voltaren gel at home which she has been advised to use - DG Shoulder Right; Future - Ambulatory referral to Physical Therapy  2. Meralgia paresthetica of left side Stable - gabapentin (NEURONTIN) 600 MG tablet; Take 1 tablet (600 mg total) by mouth 3 (three) times daily.  Dispense: 270 tablet; Refill: 1  3. Essential hypertension, benign Controlled Continue current regimen Counseled on blood pressure goal of less than  130/80, low-sodium, DASH diet, medication compliance, 150 minutes of moderate intensity exercise per week. Discussed medication compliance, adverse effects. - LP+Non-HDL Cholesterol; Future - CMP14+EGFR; Future - lisinopril-hydrochlorothiazide (ZESTORETIC) 20-25 MG tablet; Take 1 tablet by mouth daily.  Dispense: 90 tablet; Refill: 0  4. Pure hypercholesterolemia Controlled She is due for lipid panel which has been ordered Continue statin, low-cholesterol diet - atorvastatin (LIPITOR) 40 MG tablet; Take 1 tablet (40 mg total) by mouth daily.  Dispense: 90 tablet; Refill: 0  5. Vertigo Stable - meclizine (ANTIVERT) 25 MG tablet; Take 1 tablet (25 mg total) by mouth 3 (three) times daily as needed for dizziness.  Dispense: 30 tablet; Refill: 0  6. Need for immunization against influenza - Flu Vaccine QUAD 23moIM (Fluarix, Fluzone & Alfiuria Quad PF)    Meds ordered this encounter  Medications   gabapentin (NEURONTIN) 600 MG tablet    Sig: Take 1 tablet (600 mg total) by mouth 3 (three) times daily.    Dispense:  270 tablet    Refill:  1   lisinopril-hydrochlorothiazide (ZESTORETIC) 20-25 MG tablet    Sig: Take 1 tablet by mouth daily.    Dispense:  90 tablet    Refill:  0   atorvastatin (LIPITOR) 40 MG tablet    Sig:  Take 1 tablet (40 mg total) by mouth daily.    Dispense:  90 tablet    Refill:  0   meclizine (ANTIVERT) 25 MG tablet    Sig: Take 1 tablet (25 mg total) by mouth 3 (three) times daily as needed for dizziness.    Dispense:  30 tablet    Refill:  0    Follow-up: Return in about 6 months (around 01/25/2023) for Chronic medical conditions.       Charlott Rakes, MD, FAAFP. [facility] [place], [place]   07/28/2022, 9:52 AM

## 2022-07-27 NOTE — Progress Notes (Signed)
Right shoulder pain

## 2022-07-28 ENCOUNTER — Encounter: Payer: Self-pay | Admitting: Family Medicine

## 2022-08-01 ENCOUNTER — Encounter: Payer: Self-pay | Admitting: Rehabilitative and Restorative Service Providers"

## 2022-08-01 ENCOUNTER — Ambulatory Visit: Payer: Medicaid Other | Attending: Family Medicine | Admitting: Rehabilitative and Restorative Service Providers"

## 2022-08-01 ENCOUNTER — Ambulatory Visit: Payer: Medicaid Other | Attending: Family Medicine

## 2022-08-01 DIAGNOSIS — M25511 Pain in right shoulder: Secondary | ICD-10-CM | POA: Insufficient documentation

## 2022-08-01 DIAGNOSIS — G8929 Other chronic pain: Secondary | ICD-10-CM | POA: Diagnosis present

## 2022-08-01 DIAGNOSIS — I10 Essential (primary) hypertension: Secondary | ICD-10-CM

## 2022-08-01 DIAGNOSIS — R42 Dizziness and giddiness: Secondary | ICD-10-CM | POA: Diagnosis present

## 2022-08-01 DIAGNOSIS — M25562 Pain in left knee: Secondary | ICD-10-CM | POA: Diagnosis present

## 2022-08-01 DIAGNOSIS — M6281 Muscle weakness (generalized): Secondary | ICD-10-CM

## 2022-08-01 DIAGNOSIS — R2689 Other abnormalities of gait and mobility: Secondary | ICD-10-CM | POA: Diagnosis present

## 2022-08-01 NOTE — Therapy (Signed)
OUTPATIENT PHYSICAL THERAPY SHOULDER EVALUATION AND VESTIBULAR EVALUATION   Patient Name: Alison Ward MRN: [id number] DOB:Oct 03, 1961, 61 y.o., female Today's Date: 08/01/2022   PT End of Session - 08/01/22 0804     Visit Number 5    Date for PT Re-Evaluation 09/15/22    Authorization Type [employer]    Authorization - Visit Number 5    Authorization - Number of Visits 27    PT Start Time 0800    PT Stop Time 0840    PT Time Calculation (min) 40 min    Activity Tolerance Patient tolerated treatment well    Behavior During Therapy WFL for tasks assessed/performed             Past Medical History:  Diagnosis Date   Chronic headaches    daily, bitemporal, associated with tingling in face   Heart murmur    Hypertension    Seizures ([place])    partial seizures last one on 10/22/15  On Keppra currently - last seizure 08-2018 x 2 small issues    Stroke ([place]) 07/05/2013   Past Surgical History:  Procedure Laterality Date   ABDOMINAL HYSTERECTOMY N/A 12/15/2015   Procedure: HYSTERECTOMY ABDOMINAL;  Surgeon: Emily Filbert, MD;  Location: [facility];  Service: Gynecology;  Laterality: N/A;   CESAREAN SECTION     ceserean section     COLONOSCOPY     POLYPECTOMY     SALPINGOOPHORECTOMY Bilateral 12/15/2015   Procedure: SALPINGO OOPHORECTOMY;  Surgeon: Emily Filbert, MD;  Location: [facility];  Service: Gynecology;  Laterality: Bilateral;   VENTRICULOSTOMY Left 07/07/2013   Procedure: VENTRICULOSTOMY;  Surgeon: Ophelia Charter, MD;  Location: [facility];  Service: Neurosurgery;  Laterality: Left;  Left Ventriculostomy and Removal of Right Vetriculostomy   Patient Active Problem List   Diagnosis Date Noted   Primary osteoarthritis of left knee 05/19/2022   Lumbar radiculopathy 03/11/2018   Ganglion cyst of right foot 07/04/2017   Chronic tension-type headache, not intractable 12/30/2015   Post-operative state 12/15/2015   Simple partial seizure disorder ([place]) 11/04/2015   Left  sided numbness 09/25/2015   Leukopenia 09/25/2015   Tension vascular headache 09/07/2015   Meralgia paresthetica of left side 09/07/2015   Fainting spell 09/07/2015   Prediabetes 09/02/2015   Stye 08/06/2014   Essential hypertension, benign 04/16/2014   CVA (cerebral vascular accident) ([place]) 04/16/2014   Headache 04/16/2014   Tension headache 03/06/2014   Obesity, unspecified 08/13/2013   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   ICH (intracerebral hemorrhage) ([place]) 07/21/2013   Hypokalemia 07/21/2013   Intracerebral hemorrhage ([place]) 07/06/2013   Essential hypertension 02/24/2009    PCP: Charlott Rakes, MD   REFERRING PROVIDER: Charlott Rakes, MD   REFERRING DIAG: 6306925578 (ICD-10-CM) - Chronic pain of left knee M79.651,M79.652 (ICD-10-CM) - Pain in both thighs  M25.511,G89.29 (ICD-10-CM) - Chronic right shoulder pain  THERAPY DIAG:  Chronic pain of left knee  Other abnormalities of gait and mobility  Muscle weakness (generalized)  Dizziness and giddiness  Chronic right shoulder pain  Rationale for Evaluation and Treatment Rehabilitation  ONSET DATE: 05/15/2022 order received.  Pt reports intermittent pain since March 2023.  SUBJECTIVE:   SUBJECTIVE STATEMENT:  Pt states that she went to see Dr Margarita Rana about her right shoulder and was recommended for PT of her shoulder due to OA.  PERTINENT HISTORY: CVA with right sided weakness in 2017, Dizziness, HTN  PAIN:  Are you having pain? Yes: NPRS scale: 2/10 Pain  location: right knee/hip; left knee Pain description: aching and sharp Aggravating factors: sit to stand transfer Relieving factors: rest in certain positions  Reports right shoulder pain of 3/10  PRECAUTIONS: None  WEIGHT BEARING RESTRICTIONS No  FALLS:  Has patient fallen in last 6 months? 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   OBJECTIVE:   DIAGNOSTIC FINDINGS:  Left knee radiograph on 05/15/22:   IMPRESSION: Severe lateral and patellofemoral compartment osteoarthritis.  Right shoulder radiograph on 07/27/2022: IMPRESSION: Advanced degenerative arthritis at the [facility] joint with subacromial spurring.  PATIENT SURVEYS:  05/30/2022:  LEFS 22 / 80 = 27.5 % 07/25/2022:  Lower Extremity Functional Score: 26 / 80 = 32.5 %  COGNITION:  Overall cognitive status: Within functional limits for tasks assessed     SENSATION: WFL  MUSCLE LENGTH: Tight hamstrings bilaterally with left more impaired than right  PALPATION: Tenderness to palpation over left knee  LOWER EXTREMITY ROM:  05/30/2022: Right knee 0 to 115 degrees Left knee 5 to 110 degrees  07/25/2022: Right knee 0 to 117 degrees Left knee 3 to 112 degrees  UPPER EXTREMITY STRENGTH/ROM:  08/01/2022: Right shoulder flexion 120 deg, Right shoulder abduction 115 degrees Left shoulder flexion 142 deg, Left shoulder abduction 135 deg Bilateral shoulder strength of 4/5 grossly throughout with pain Grip strength:  Right 39 lbs, Left 36 lbs  LOWER EXTREMITY MMT:  05/30/2022: Right hip 4/5, Right knee 5/5 Left hip 4-/5, left knee 4/5  07/25/2022: Right hip 4 to 4+/5, Right knee 5/5 Left hip 4/5, left knee 4+/5   LOWER EXTREMITY SPECIAL TESTS:  05/30/2022:  Knee special tests: Anterior drawer test: negative and Posterior drawer test: negative  FUNCTIONAL TESTS:   05/30/2022 5 times sit to stand: 29.7 sec with use of BUE Timed up and go (TUG): 15.3 sec without assistive device, with some unsteadiness noted with 180 degree turn  07/25/2022: 5  times sit to stand: 19.6 sec without use of BUE Timed up and go (TUG): 12.9 sec without assistive device 3 min walk test:  523 ft  GAIT: Distance walked: 100 ft Assistive device utilized: None Level of assistance: Modified independence Comments: Pt with antalgic gait pattern, pt recommend that she may benefit from use of SPC  VESTIBULAR EVALUATION: 08/01/2022: Pt reports that she sleeps on multiple pillows secondary to having vertigo when she lies down in bed, particularly on her right side Dix Hallpike positive on right side    TODAY'S TREATMENT: 08/01/2022: Nustep level 4 x7 min with PT present to discuss status Shoulder A/ROM and shoulder strength assessment Sit to/from stand holding 5# kettlebell:  x10 with chest press, x10 with overhead press Dix Hallpike positive on right side, proceeded to treatment using Printmaker x2.  Pt reported symptoms resolved during second trial of Epley maneuver.   07/25/2022: Nustep level 5 x6 min with PT present to discuss status LEFS, TUG, 5 times sit to/from stand, A/ROM Seated with 2.5# ankle weights:  heel/toe raises, marching, LAQ. 2x10 bilat Seated hip abduction and hamstring curls with red tband 2x10 each bilat Sit to/from stand x10 reps 3 min ambulation around PT  clinic without assistive device   8/25: Nu-Step L4 3 min (painful today) Manual therapy: Addaday soft tissue mobilization to bil quads ; right hip mobs grade  2/3 distraction, inferior 10x each Green strap : left knee assisted flexion 10x, HS stretch right/left Sidelying clam 10x right/left Supine clam with green band 10x  Pendulum swings 5# off 4 inch step 20x right/left     PATIENT EDUCATION:  Education details: Issued HEP Person educated: Patient Education method: Explanation, Media planner, and Handouts Education comprehension: verbalized understanding and returned demonstration   HOME EXERCISE PROGRAM: Access Code: [email or web address] URL:  [email or web address] Date: 06/14/2022 Prepared by: Ruben Im  Exercises - Seated Heel Toe Raises  - 1 x daily - 7 x weekly - 2 sets - 10 reps - Seated March  - 1 x daily - 7 x weekly - 2 sets - 10 reps - Seated Long Arc Quad  - 1 x daily - 7 x weekly - 2 sets - 10 reps - Seated Hip Adduction Isometrics with Ball  - 1 x daily - 7 x weekly - 2 sets - 10 reps - Supine Quad Set  - 1 x daily - 7 x weekly - 1 sets - 10 reps - Supine Heel Slide  - 1 x daily - 7 x weekly - 1 sets - 10 reps - Active Straight Leg Raise with Quad Set  - 1 x daily - 7 x weekly - 1 sets - 10 reps - Sidelying Hip Abduction (Mirrored)  - 1 x daily - 7 x weekly - 1 sets - 10 reps - [place]  - 1 x daily - 7 x weekly - 3 sets - 10 reps - Retro Step  - 1 x daily - 7 x weekly - 3 sets - 10 reps   ASSESSMENT:  CLINICAL IMPRESSION: Ms Presti presents to skilled PT with a new order for right shoulder pain.  Pt additionally reports that she has had some dizziness and sleeps on increased pillows.  Received signed certification back with agreement to perform vestibular rehab and canalith repositioning, as needed.  Additionally, received separate PT evaluation order for right shoulder.  Pt with positive Marye Round and proceeded with canalith repositioning using Printmaker.  Pt reports that dizziness symptoms resolved following 2nd Epley Maneuver and was able to look up without dizziness.  Pt with right shoulder weakness and decreased A/ROM noted.  Pt will maintain same frequency and end date with new goals written for right shoulder pain and weakness.  Pt continues to require skilled PT to progress towards goal related activities.  OBJECTIVE IMPAIRMENTS decreased balance, difficulty walking, decreased strength, dizziness, impaired flexibility, and pain.   ACTIVITY LIMITATIONS squatting, stairs, and transfers  PARTICIPATION LIMITATIONS: cleaning, laundry, and community activity  PERSONAL FACTORS Time  since onset of injury/illness/exacerbation and 3+ comorbidities: CVA with right sided weakness, Seizures, HTN  are also affecting patient's functional outcome.   REHAB POTENTIAL: Good  CLINICAL DECISION MAKING: Evolving/moderate complexity  EVALUATION COMPLEXITY: Moderate   GOALS: Goals reviewed with patient? Yes  SHORT TERM GOALS: Target date: 06/20/2022  Pt will be independent with initial HEP. Baseline: Goal status: goal met 8/25  2.  Pt will improve TUG time to less than 14 seconds to place her at a low risk of falling. Baseline: 15.3 sec Goal status: MET 07/25/2022   LONG TERM GOALS: Target date: 09/15/2022   Pt will be independent with advanced HEP. Baseline:  Goal status: IN PROGRESS  2.  Pt will  increase LEFS to at least 50% to demonstrate improvements in functional tasks. Baseline: 27.5% Goal status: IN PROGRESS (see above)  3.  Pt will increase BLE strength to at least 4+/5 to allow her to negotiate stairs with improved ease. Baseline:  Goal status: IN PROGRESS (see above)  4.  Pt will be able to ambulate for at least 15-20 minutes with LRAD and without reports of increased pain. Baseline:  Goal status: IN PROGRESS  5.  Pt will report at least an 80% improvement in dizziness symptoms and deny dizziness when lying in bed and rolling over. Baseline:  on 07/25/22, pt reports some dizziness in bed Goal status:  INITIAL  6.  Pt will increase bilateral shoulder strength to at least 4+/5 to allow her to perform functional tasks with improved ease.  Baseline:  on 08/01/22 measured at 4/5 bilat  Goal status:  INITIAL  7.  Pt will increase right shoulder A/ROM for flexion and abduction to at least 140 degrees to allow her to reach into cabinets.  Baseline:  On 08/01/2022 measured right flexion 120 deg, right abduction 115 deg  Goal status:  INITIAL    PLAN: PT FREQUENCY: 1-2x/week  PT DURATION: 8 weeks  PLANNED INTERVENTIONS: Therapeutic exercises, Therapeutic  activity, Neuromuscular re-education, Balance training, Gait training, Patient/Family education, Self Care, Joint mobilization, Joint manipulation, Stair training, Vestibular training, Canalith repositioning, Aquatic Therapy, Dry Needling, Electrical stimulation, Cryotherapy, Moist heat, Taping, Ultrasound, Ionotophoresis 99m/ml Dexamethasone, Manual therapy, and Re-evaluation  PLAN FOR NEXT SESSION:  Assess and progress HEP as indicated, strengthening, flexibility, balance, Dix Hallpike and canalith repositioning as indicated.   SJuel Burrow PT 08/01/22 8:53 AM  [facility] [address] [place] [address]Phone # [phone] [phone]

## 2022-08-02 LAB — CMP14+EGFR
ALT: 11 IU/L (ref 0–32)
AST: 16 IU/L (ref 0–40)
Albumin/Globulin Ratio: 1.5 (ref 1.2–2.2)
Albumin: 4.4 g/dL (ref 3.9–4.9)
Alkaline Phosphatase: 63 IU/L (ref 44–121)
BUN/Creatinine Ratio: 13 (ref 12–28)
BUN: 14 mg/dL (ref 8–27)
Bilirubin Total: 0.8 mg/dL (ref 0.0–1.2)
CO2: 29 mmol/L (ref 20–29)
Calcium: 9.8 mg/dL (ref 8.7–10.3)
Chloride: 99 mmol/L (ref 96–106)
Creatinine, Ser: 1.04 mg/dL — ABNORMAL HIGH (ref 0.57–1.00)
Globulin, Total: 2.9 g/dL (ref 1.5–4.5)
Glucose: 83 mg/dL (ref 70–99)
Potassium: 4.1 mmol/L (ref 3.5–5.2)
Sodium: 141 mmol/L (ref 134–144)
Total Protein: 7.3 g/dL (ref 6.0–8.5)
eGFR: 60 mL/min/{1.73_m2} (ref >59)

## 2022-08-02 LAB — LP+NON-HDL CHOLESTEROL
Cholesterol, Total: 130 mg/dL (ref 100–199)
HDL: 40 mg/dL (ref >39)
LDL Chol Calc (NIH): 68 mg/dL (ref 0–99)
Total Non-HDL-Chol (LDL+VLDL): 90 mg/dL (ref 0–129)
Triglycerides: 121 mg/dL (ref 0–149)
VLDL Cholesterol Cal: 22 mg/dL (ref 5–40)

## 2022-08-03 ENCOUNTER — Ambulatory Visit: Payer: Medicaid Other | Admitting: Rehabilitative and Restorative Service Providers"

## 2022-08-03 ENCOUNTER — Encounter: Payer: Self-pay | Admitting: Rehabilitative and Restorative Service Providers"

## 2022-08-03 DIAGNOSIS — R42 Dizziness and giddiness: Secondary | ICD-10-CM

## 2022-08-03 DIAGNOSIS — R2689 Other abnormalities of gait and mobility: Secondary | ICD-10-CM

## 2022-08-03 DIAGNOSIS — M6281 Muscle weakness (generalized): Secondary | ICD-10-CM

## 2022-08-03 DIAGNOSIS — M25562 Pain in left knee: Secondary | ICD-10-CM | POA: Diagnosis not present

## 2022-08-03 DIAGNOSIS — G8929 Other chronic pain: Secondary | ICD-10-CM

## 2022-08-03 NOTE — Therapy (Signed)
OUTPATIENT PHYSICAL THERAPY TREATMENT NOTE   Patient Name: Alison Ward MRN: [id number] DOB:1961/05/18, 61 y.o., female Today's Date: 08/03/2022   PT End of Session - 08/03/22 0808     Visit Number 6    Date for PT Re-Evaluation 09/15/22    Authorization Type [employer]    Authorization - Visit Number 6    Authorization - Number of Visits 27    PT Start Time 0800    PT Stop Time 0840    PT Time Calculation (min) 40 min    Activity Tolerance Patient tolerated treatment well    Behavior During Therapy WFL for tasks assessed/performed             Past Medical History:  Diagnosis Date   Chronic headaches    daily, bitemporal, associated with tingling in face   Heart murmur    Hypertension    Seizures ([place])    partial seizures last one on 10/22/15  On Keppra currently - last seizure 08-2018 x 2 small issues    Stroke ([place]) 07/05/2013   Past Surgical History:  Procedure Laterality Date   ABDOMINAL HYSTERECTOMY N/A 12/15/2015   Procedure: HYSTERECTOMY ABDOMINAL;  Surgeon: Emily Filbert, MD;  Location: [facility];  Service: Gynecology;  Laterality: N/A;   CESAREAN SECTION     ceserean section     COLONOSCOPY     POLYPECTOMY     SALPINGOOPHORECTOMY Bilateral 12/15/2015   Procedure: SALPINGO OOPHORECTOMY;  Surgeon: Emily Filbert, MD;  Location: [facility];  Service: Gynecology;  Laterality: Bilateral;   VENTRICULOSTOMY Left 07/07/2013   Procedure: VENTRICULOSTOMY;  Surgeon: Ophelia Charter, MD;  Location: [facility];  Service: Neurosurgery;  Laterality: Left;  Left Ventriculostomy and Removal of Right Vetriculostomy   Patient Active Problem List   Diagnosis Date Noted   Primary osteoarthritis of left knee 05/19/2022   Lumbar radiculopathy 03/11/2018   Ganglion cyst of right foot 07/04/2017   Chronic tension-type headache, not intractable 12/30/2015   Post-operative state 12/15/2015   Simple partial seizure disorder ([place]) 11/04/2015   Left sided numbness 09/25/2015    Leukopenia 09/25/2015   Tension vascular headache 09/07/2015   Meralgia paresthetica of left side 09/07/2015   Fainting spell 09/07/2015   Prediabetes 09/02/2015   Stye 08/06/2014   Essential hypertension, benign 04/16/2014   CVA (cerebral vascular accident) ([place]) 04/16/2014   Headache 04/16/2014   Tension headache 03/06/2014   Obesity, unspecified 08/13/2013   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   ICH (intracerebral hemorrhage) ([place]) 07/21/2013   Hypokalemia 07/21/2013   Intracerebral hemorrhage ([place]) 07/06/2013   Essential hypertension 02/24/2009    PCP: Charlott Rakes, MD   REFERRING PROVIDER: Charlott Rakes, MD   REFERRING DIAG: [id number] (ICD-10-CM) - Chronic pain of left knee M79.651,M79.652 (ICD-10-CM) - Pain in both thighs  M25.511,G89.29 (ICD-10-CM) - Chronic right shoulder pain  THERAPY DIAG:  Chronic pain of left knee  Other abnormalities of gait and mobility  Muscle weakness (generalized)  Chronic right shoulder pain  Dizziness and giddiness  Rationale for Evaluation and Treatment Rehabilitation  ONSET DATE: 05/15/2022 order received.  Pt reports intermittent pain since March 2023.  SUBJECTIVE:   SUBJECTIVE STATEMENT:  Pt reports that she was able to lie down on the bed without any dizziness since last visit.  States that her shoulder is feeling a little looser.  PERTINENT HISTORY: CVA with right sided weakness in 2017, Dizziness, HTN  PAIN:  Are you having pain? Yes: NPRS scale: 4/10  Pain location: right leg and right shoulder Pain description: aching and sharp Aggravating factors: sit to stand transfer Relieving factors: rest in certain positions   PRECAUTIONS: None  WEIGHT BEARING RESTRICTIONS No  FALLS:  Has patient fallen in last 6 months? 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   OBJECTIVE:   DIAGNOSTIC FINDINGS:  Left knee radiograph on 05/15/22:   IMPRESSION: Severe lateral and patellofemoral compartment osteoarthritis.  Right shoulder radiograph on 07/27/2022: IMPRESSION: Advanced degenerative arthritis at the [facility] joint with subacromial spurring.  PATIENT SURVEYS:  05/30/2022:  LEFS 22 / 80 = 27.5 % 07/25/2022:  Lower Extremity Functional Score: 26 / 80 = 32.5 %  COGNITION:  Overall cognitive status: Within functional limits for tasks assessed     SENSATION: WFL  MUSCLE LENGTH: Tight hamstrings bilaterally with left more impaired than right  PALPATION: Tenderness to palpation over left knee  LOWER EXTREMITY ROM:  05/30/2022: Right knee 0 to 115 degrees Left knee 5 to 110 degrees  07/25/2022: Right knee 0 to 117 degrees Left knee 3 to 112 degrees  UPPER EXTREMITY STRENGTH/ROM:  08/01/2022: Right shoulder flexion 120 deg, Right shoulder abduction 115 degrees Left shoulder flexion 142 deg, Left shoulder abduction 135 deg Bilateral shoulder strength of 4/5 grossly throughout with pain Grip strength:  Right 39 lbs, Left 36 lbs  LOWER EXTREMITY MMT:  05/30/2022: Right hip 4/5, Right knee 5/5 Left hip 4-/5, left knee 4/5  07/25/2022: Right hip 4 to 4+/5, Right knee 5/5 Left hip 4/5, left knee 4+/5   LOWER EXTREMITY SPECIAL TESTS:  05/30/2022:  Knee special tests: Anterior drawer test: negative and Posterior drawer test: negative  FUNCTIONAL TESTS:   05/30/2022 5 times sit to stand: 29.7 sec with use of BUE Timed up and go (TUG): 15.3 sec without assistive device, with some unsteadiness noted with 180 degree turn  07/25/2022: 5 times sit to stand: 19.6 sec without  use of BUE Timed up and go (TUG): 12.9 sec without assistive device 3 min walk test:  523 ft  GAIT: Distance walked: 100 ft Assistive device utilized: None Level of assistance: Modified independence Comments: Pt with antalgic gait pattern, pt recommend that she may benefit from use of SPC  VESTIBULAR EVALUATION: 08/01/2022: Pt reports that she sleeps on multiple pillows secondary to having vertigo when she lies down in bed, particularly on her right side Dix Hallpike positive on right side    TODAY'S TREATMENT:  08/03/2022: Nustep level 4 x7 min with PT present to discuss status Wall wash for shoulder flexion and abduction 2x10 RUE Sit to/from stand holding 5# kettlebell:  x10 with chest press, x10 with overhead press Seated with 3# ankle weights:  heel/toe raises, marching, LAQ. 2x10 bilat Seated hip adduction ball squeeze, clamshell with red tband, hamstring curl with red tband.  2x10 bilat each   08/01/2022: Nustep level 4 x7 min with PT present to discuss status Shoulder A/ROM and shoulder strength assessment Sit to/from stand holding 5# kettlebell:  x10 with chest press, x10 with overhead press Dix Hallpike positive on right side, proceeded to treatment using Printmaker x2.  Pt  reported symptoms resolved during second trial of Epley maneuver.   07/25/2022: Nustep level 5 x6 min with PT present to discuss status LEFS, TUG, 5 times sit to/from stand, A/ROM Seated with 2.5# ankle weights:  heel/toe raises, marching, LAQ. 2x10 bilat Seated hip abduction and hamstring curls with red tband 2x10 each bilat Sit to/from stand x10 reps 3 min ambulation around PT clinic without assistive device    PATIENT EDUCATION:  Education details: Issued HEP Person educated: Patient Education method: Consulting civil engineer, Media planner, and Handouts Education comprehension: verbalized understanding and returned demonstration   HOME EXERCISE PROGRAM: Access Code: [email or web address] URL:  [email or web address] Date: 08/03/2022 Prepared by: Juel Burrow  Exercises - Seated Heel Toe Raises  - 1 x daily - 7 x weekly - 2 sets - 10 reps - Seated March  - 1 x daily - 7 x weekly - 2 sets - 10 reps - Seated Long Arc Quad  - 1 x daily - 7 x weekly - 2 sets - 10 reps - Seated Hip Adduction Isometrics with Ball  - 1 x daily - 7 x weekly - 2 sets - 10 reps - Supine Quad Set  - 1 x daily - 7 x weekly - 1 sets - 10 reps - Supine Heel Slide  - 1 x daily - 7 x weekly - 1 sets - 10 reps - Active Straight Leg Raise with Quad Set  - 1 x daily - 7 x weekly - 1 sets - 10 reps - Sidelying Hip Abduction (Mirrored)  - 1 x daily - 7 x weekly - 1 sets - 10 reps - [place]  - 1 x daily - 7 x weekly - 3 sets - 10 reps - Retro Step  - 1 x daily - 7 x weekly - 3 sets - 10 reps - Shoulder External Rotation and Scapular Retraction with Resistance  - 1 x daily - 7 x weekly - 2 sets - 10 reps - Standing Shoulder Horizontal Abduction with Resistance  - 1 x daily - 7 x weekly - 2 sets - 10 reps - Shoulder Flexion Wall Slide with Towel  - 1 x daily - 7 x weekly - 2 sets - 10 reps - Standing Shoulder Abduction Slides at Wall  - 1 x daily - 7 x weekly - 2 sets - 10 reps   ASSESSMENT:  CLINICAL IMPRESSION: Ms Clonch presents to skilled PT with reports that she was able to sleep on only one pillow for the first time in a long time.  She states that she has been able to roll over without dizziness.  Pt provided with updated HEP and red theraband for home management.  Pt requires min cuing during exercises for pacing and technique, but able to correct with cuing.  Pt continues to require skilled PT to progress towards goal related activities.  OBJECTIVE IMPAIRMENTS decreased balance, difficulty walking, decreased strength, dizziness, impaired flexibility, and pain.   ACTIVITY LIMITATIONS squatting, stairs, and transfers  PARTICIPATION LIMITATIONS: cleaning, laundry, and community  activity  PERSONAL FACTORS Time since onset of injury/illness/exacerbation and 3+ comorbidities: CVA with right sided weakness, Seizures, HTN  are also affecting patient's functional outcome.   REHAB POTENTIAL: Good  CLINICAL DECISION MAKING: Evolving/moderate complexity  EVALUATION COMPLEXITY: Moderate   GOALS: Goals reviewed with patient? Yes  SHORT TERM GOALS: Target date: 06/20/2022  Pt will be independent with initial HEP. Baseline: Goal status: goal met 8/25  2.  Pt will improve TUG time to less than 14  seconds to place her at a low risk of falling. Baseline: 15.3 sec Goal status: MET 07/25/2022   LONG TERM GOALS: Target date: 09/15/2022   Pt will be independent with advanced HEP. Baseline:  Goal status: IN PROGRESS  2.  Pt will increase LEFS to at least 50% to demonstrate improvements in functional tasks. Baseline: 27.5% Goal status: IN PROGRESS (see above)  3.  Pt will increase BLE strength to at least 4+/5 to allow her to negotiate stairs with improved ease. Baseline:  Goal status: IN PROGRESS (see above)  4.  Pt will be able to ambulate for at least 15-20 minutes with LRAD and without reports of increased pain. Baseline:  Goal status: IN PROGRESS  5.  Pt will report at least an 80% improvement in dizziness symptoms and deny dizziness when lying in bed and rolling over. Baseline:  on 07/25/22, pt reports some dizziness in bed Goal status:  IN PROGRESS  6.  Pt will increase bilateral shoulder strength to at least 4+/5 to allow her to perform functional tasks with improved ease.  Baseline:  on 08/01/22 measured at 4/5 bilat  Goal status:  INITIAL  7.  Pt will increase right shoulder A/ROM for flexion and abduction to at least 140 degrees to allow her to reach into cabinets.  Baseline:  On 08/01/2022 measured right flexion 120 deg, right abduction 115 deg  Goal status:  INITIAL    PLAN: PT FREQUENCY: 1-2x/week  PT DURATION: 8 weeks  PLANNED INTERVENTIONS:  Therapeutic exercises, Therapeutic activity, Neuromuscular re-education, Balance training, Gait training, Patient/Family education, Self Care, Joint mobilization, Joint manipulation, Stair training, Vestibular training, Canalith repositioning, Aquatic Therapy, Dry Needling, Electrical stimulation, Cryotherapy, Moist heat, Taping, Ultrasound, Ionotophoresis 74m/ml Dexamethasone, Manual therapy, and Re-evaluation  PLAN FOR NEXT SESSION:  Assess and progress HEP as indicated, strengthening, flexibility, balance, Dix Hallpike and canalith repositioning as indicated.   SJuel Burrow PT 08/03/22 8:46 AM  [facility] [address] [place]ect[place]Union [place] [address]Phone # [phone] [phone]

## 2022-08-07 ENCOUNTER — Ambulatory Visit
Admission: RE | Admit: 2022-08-07 | Discharge: 2022-08-07 | Disposition: A | Discharge status: Home / Self Care | Payer: Medicaid Other | Source: Ambulatory Visit | Attending: Family Medicine | Admitting: Family Medicine

## 2022-08-07 DIAGNOSIS — Z1231 Encounter for screening mammogram for malignant neoplasm of breast: Secondary | ICD-10-CM | POA: Diagnosis not present

## 2022-08-10 ENCOUNTER — Ambulatory Visit: Payer: Medicaid Other | Admitting: Rehabilitative and Restorative Service Providers"

## 2022-08-15 ENCOUNTER — Ambulatory Visit: Payer: Medicaid Other | Admitting: Physical Therapy

## 2022-08-15 DIAGNOSIS — M6281 Muscle weakness (generalized): Secondary | ICD-10-CM

## 2022-08-15 DIAGNOSIS — M25562 Pain in left knee: Secondary | ICD-10-CM | POA: Diagnosis not present

## 2022-08-15 DIAGNOSIS — G8929 Other chronic pain: Secondary | ICD-10-CM

## 2022-08-15 DIAGNOSIS — R2689 Other abnormalities of gait and mobility: Secondary | ICD-10-CM

## 2022-08-15 NOTE — Therapy (Signed)
OUTPATIENT PHYSICAL THERAPY TREATMENT NOTE   Patient Name: Alison Ward MRN: [id number] DOB:Nov 26, 1960, 61 y.o., female Today's Date: 08/15/2022   PT End of Session - 08/15/22 0921     Visit Number 7    Date for PT Re-Evaluation 09/15/22    Authorization Type [employer] Medicaid    Authorization - Visit Number 6    Authorization - Number of Visits 27    PT Start Time [phone]    PT Stop Time 1008    PT Time Calculation (min) 43 min    Activity Tolerance Patient tolerated treatment well             Past Medical History:  Diagnosis Date   Chronic headaches    daily, bitemporal, associated with tingling in face   Heart murmur    Hypertension    Seizures ([place])    partial seizures last one on 10/22/15  On Keppra currently - last seizure 08-2018 x 2 small issues    Stroke ([place]) 07/05/2013   Past Surgical History:  Procedure Laterality Date   ABDOMINAL HYSTERECTOMY N/A 12/15/2015   Procedure: HYSTERECTOMY ABDOMINAL;  Surgeon: Emily Filbert, MD;  Location: [facility];  Service: Gynecology;  Laterality: N/A;   CESAREAN SECTION     ceserean section     COLONOSCOPY     POLYPECTOMY     SALPINGOOPHORECTOMY Bilateral 12/15/2015   Procedure: SALPINGO OOPHORECTOMY;  Surgeon: Emily Filbert, MD;  Location: [facility];  Service: Gynecology;  Laterality: Bilateral;   VENTRICULOSTOMY Left 07/07/2013   Procedure: VENTRICULOSTOMY;  Surgeon: Ophelia Charter, MD;  Location: [facility];  Service: Neurosurgery;  Laterality: Left;  Left Ventriculostomy and Removal of Right Vetriculostomy   Patient Active Problem List   Diagnosis Date Noted   Primary osteoarthritis of left knee 05/19/2022   Lumbar radiculopathy 03/11/2018   Ganglion cyst of right foot 07/04/2017   Chronic tension-type headache, not intractable 12/30/2015   Post-operative state 12/15/2015   Simple partial seizure disorder ([place]) 11/04/2015   Left sided numbness 09/25/2015   Leukopenia 09/25/2015   Tension vascular headache 09/07/2015    Meralgia paresthetica of left side 09/07/2015   Fainting spell 09/07/2015   Prediabetes 09/02/2015   Stye 08/06/2014   Essential hypertension, benign 04/16/2014   CVA (cerebral vascular accident) ([place]) 04/16/2014   Headache 04/16/2014   Tension headache 03/06/2014   Obesity, unspecified 08/13/2013   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   ICH (intracerebral hemorrhage) ([place]) 07/21/2013   Hypokalemia 07/21/2013   Intracerebral hemorrhage ([place]) 07/06/2013   Essential hypertension 02/24/2009    PCP: Charlott Rakes, MD   REFERRING PROVIDER: Charlott Rakes, MD   REFERRING DIAG: [id number] (ICD-10-CM) - Chronic pain of left knee M79.651,M79.652 (ICD-10-CM) - Pain in both thighs  M25.511,G89.29 (ICD-10-CM) - Chronic right shoulder pain  THERAPY DIAG:  Chronic pain of left knee  Other abnormalities of gait and mobility  Muscle weakness (generalized)  Chronic right shoulder pain  Rationale for Evaluation and Treatment Rehabilitation  ONSET DATE: 05/15/2022 order received.  Pt reports intermittent pain since March 2023.  SUBJECTIVE:   SUBJECTIVE STATEMENT:  My legs are getting stronger, I can get in/out of the car easier.  I notice the left knee pain is a lot less intense than it used to be.  I can stand up longer at church.  I need more work on the legs.  She gave me some shoulder ex's to work on.     PERTINENT HISTORY: CVA with right  sided weakness in 2017, Dizziness, HTN  PAIN:  Are you having pain? Yes: NPRS scale: 3/10 Pain location: right leg and right shoulder Pain description: aching and sharp Aggravating factors: sit to stand transfer Relieving factors: rest in certain positions   PRECAUTIONS: None  WEIGHT BEARING RESTRICTIONS No  FALLS:  Has patient fallen in last 6 months? 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   OBJECTIVE:   DIAGNOSTIC FINDINGS:  Left knee radiograph on 05/15/22:   IMPRESSION: Severe lateral and patellofemoral compartment osteoarthritis.  Right shoulder radiograph on 07/27/2022: IMPRESSION: Advanced degenerative arthritis at the [facility] joint with subacromial spurring.  PATIENT SURVEYS:  05/30/2022:  LEFS 22 / 80 = 27.5 % 07/25/2022:  Lower Extremity Functional Score: 26 / 80 = 32.5 %  COGNITION:  Overall cognitive status: Within functional limits for tasks assessed     SENSATION: WFL  MUSCLE LENGTH: Tight hamstrings bilaterally with left more impaired than right  PALPATION: Tenderness to palpation over left knee  LOWER EXTREMITY ROM:  05/30/2022: Right knee 0 to 115 degrees Left knee 5 to 110 degrees  07/25/2022: Right knee 0 to 117 degrees Left knee 3 to 112 degrees  UPPER EXTREMITY STRENGTH/ROM:  08/01/2022: Right shoulder flexion 120 deg, Right shoulder abduction 115 degrees Left shoulder flexion 142 deg, Left shoulder abduction 135 deg Bilateral shoulder strength of 4/5 grossly throughout with pain Grip strength:  Right 39 lbs, Left 36 lbs  LOWER EXTREMITY MMT:  05/30/2022: Right hip 4/5, Right knee 5/5 Left hip 4-/5, left knee 4/5  07/25/2022: Right hip 4 to 4+/5, Right knee 5/5 Left hip 4/5, left knee 4+/5   LOWER EXTREMITY SPECIAL TESTS:  05/30/2022:  Knee special tests: Anterior drawer test: negative and Posterior drawer test: negative  FUNCTIONAL TESTS:   05/30/2022 5 times sit to stand: 29.7 sec with use of BUE Timed up and go (TUG): 15.3 sec without assistive device, with some unsteadiness noted with 180 degree turn  07/25/2022: 5 times sit to stand:  19.6 sec without use of BUE Timed up and go (TUG): 12.9 sec without assistive device 3 min walk test:  523 ft 10/17: 3 min walk test: 530 feet  GAIT: Distance walked: 100 ft Assistive device utilized: None Level of assistance: Modified independence Comments: Pt with antalgic gait pattern, pt recommend that she may benefit from use of SPC  VESTIBULAR EVALUATION: 08/01/2022: Pt reports that she sleeps on multiple pillows secondary to having vertigo when she lies down in bed, particularly on her right side Dix Hallpike positive on right side    TODAY'S TREATMENT: 10/17: Nustep level 2 x7 min with PT present to discuss status 3 min walk test   only 1/10 afterwards Sit to/from stand holding 5# kettlebell: x10 with overhead press Seated with 3# ankle weights:  heel/toe raises, marching, LAQ 2x10 bilat Seated hip adduction ball squeeze, clamshell with red tband, hamstring curl with red tband.  2x10 bilat each   08/03/2022: Nustep level 4 x7 min with PT present to discuss status Wall wash for shoulder flexion and abduction 2x10 RUE Sit to/from stand holding 5# kettlebell:  x10 with  chest press, x10 with overhead press Seated with 3# ankle weights:  heel/toe raises, marching, LAQ. 2x10 bilat Seated hip adduction ball squeeze, clamshell with red tband, hamstring curl with red tband.  2x10 bilat each   08/01/2022: Nustep level 4 x7 min with PT present to discuss status Shoulder A/ROM and shoulder strength assessment Sit to/from stand holding 5# kettlebell:  x10 with chest press, x10 with overhead press Dix Hallpike positive on right side, proceeded to treatment using Printmaker x2.  Pt reported symptoms resolved during second trial of Epley maneuver.    PATIENT EDUCATION:  Education details: Issued HEP Person educated: Patient Education method: Explanation, Media planner, and Handouts Education comprehension: verbalized understanding and returned demonstration   HOME EXERCISE  PROGRAM: Access Code: [email or web address] URL: [email or web address] Date: 08/03/2022 Prepared by: Juel Burrow  Exercises - Seated Heel Toe Raises  - 1 x daily - 7 x weekly - 2 sets - 10 reps - Seated March  - 1 x daily - 7 x weekly - 2 sets - 10 reps - Seated Long Arc Quad  - 1 x daily - 7 x weekly - 2 sets - 10 reps - Seated Hip Adduction Isometrics with Ball  - 1 x daily - 7 x weekly - 2 sets - 10 reps - Supine Quad Set  - 1 x daily - 7 x weekly - 1 sets - 10 reps - Supine Heel Slide  - 1 x daily - 7 x weekly - 1 sets - 10 reps - Active Straight Leg Raise with Quad Set  - 1 x daily - 7 x weekly - 1 sets - 10 reps - Sidelying Hip Abduction (Mirrored)  - 1 x daily - 7 x weekly - 1 sets - 10 reps - Church Pew  - 1 x daily - 7 x weekly - 3 sets - 10 reps - Retro Step  - 1 x daily - 7 x weekly - 3 sets - 10 reps - Shoulder External Rotation and Scapular Retraction with Resistance  - 1 x daily - 7 x weekly - 2 sets - 10 reps - Standing Shoulder Horizontal Abduction with Resistance  - 1 x daily - 7 x weekly - 2 sets - 10 reps - Shoulder Flexion Wall Slide with Towel  - 1 x daily - 7 x weekly - 2 sets - 10 reps - Standing Shoulder Abduction Slides at Wall  - 1 x daily - 7 x weekly - 2 sets - 10 reps   ASSESSMENT:  CLINICAL IMPRESSION: Noted improvement in steadiness without loss of balance with turns during 3 min walk test.  Less pain intensity overall even with higher challenge level ex's.  Remains highly motivated.  Therapist monitoring response to all interventions and modifying treatment accordingly.    OBJECTIVE IMPAIRMENTS decreased balance, difficulty walking, decreased strength, dizziness, impaired flexibility, and pain.   ACTIVITY LIMITATIONS squatting, stairs, and transfers  PARTICIPATION LIMITATIONS: cleaning, laundry, and community activity  PERSONAL FACTORS Time since onset of injury/illness/exacerbation and 3+ comorbidities: CVA with right sided weakness, Seizures, HTN   are also affecting patient's functional outcome.   REHAB POTENTIAL: Good  CLINICAL DECISION MAKING: Evolving/moderate complexity  EVALUATION COMPLEXITY: Moderate   GOALS: Goals reviewed with patient? Yes  SHORT TERM GOALS: Target date: 06/20/2022  Pt will be independent with initial HEP. Baseline: Goal status: goal met 8/25  2.  Pt will improve TUG time to less than 14 seconds to place her at a low risk of falling. Baseline: 15.3  sec Goal status: MET 07/25/2022   LONG TERM GOALS: Target date: 09/15/2022   Pt will be independent with advanced HEP. Baseline:  Goal status: IN PROGRESS  2.  Pt will increase LEFS to at least 50% to demonstrate improvements in functional tasks. Baseline: 27.5% Goal status: IN PROGRESS (see above)  3.  Pt will increase BLE strength to at least 4+/5 to allow her to negotiate stairs with improved ease. Baseline:  Goal status: IN PROGRESS (see above)  4.  Pt will be able to ambulate for at least 15-20 minutes with LRAD and without reports of increased pain. Baseline:  Goal status: IN PROGRESS  5.  Pt will report at least an 80% improvement in dizziness symptoms and deny dizziness when lying in bed and rolling over. Baseline:  on 07/25/22, pt reports some dizziness in bed Goal status:  IN PROGRESS  6.  Pt will increase bilateral shoulder strength to at least 4+/5 to allow her to perform functional tasks with improved ease.  Baseline:  on 08/01/22 measured at 4/5 bilat  Goal status:  INITIAL  7.  Pt will increase right shoulder A/ROM for flexion and abduction to at least 140 degrees to allow her to reach into cabinets.  Baseline:  On 08/01/2022 measured right flexion 120 deg, right abduction 115 deg  Goal status:  INITIAL    PLAN: PT FREQUENCY: 1-2x/week  PT DURATION: 8 weeks  PLANNED INTERVENTIONS: Therapeutic exercises, Therapeutic activity, Neuromuscular re-education, Balance training, Gait training, Patient/Family education, Self Care,  Joint mobilization, Joint manipulation, Stair training, Vestibular training, Canalith repositioning, Aquatic Therapy, Dry Needling, Electrical stimulation, Cryotherapy, Moist heat, Taping, Ultrasound, Ionotophoresis 53m/ml Dexamethasone, Manual therapy, and Re-evaluation  PLAN FOR NEXT SESSION:  Assess and progress HEP as indicated, strengthening, flexibility, balance, Dix Hallpike and canalith repositioning as indicated.   SRuben Im PT 08/15/22 11:16 AM Phone: [phone]: [phone] [address] [address] [place] [address]Phone # [phone] [phone]

## 2022-08-17 ENCOUNTER — Ambulatory Visit: Payer: Medicaid Other | Admitting: Physical Therapy

## 2022-08-17 DIAGNOSIS — M25562 Pain in left knee: Secondary | ICD-10-CM | POA: Diagnosis not present

## 2022-08-17 DIAGNOSIS — R2689 Other abnormalities of gait and mobility: Secondary | ICD-10-CM

## 2022-08-17 DIAGNOSIS — G8929 Other chronic pain: Secondary | ICD-10-CM

## 2022-08-17 DIAGNOSIS — M6281 Muscle weakness (generalized): Secondary | ICD-10-CM

## 2022-08-17 NOTE — Therapy (Signed)
OUTPATIENT PHYSICAL THERAPY TREATMENT NOTE   Patient Name: Alison Ward MRN: [id number] DOB:19-Apr-1961, 61 y.o., female Today's Date: 08/17/2022   PT End of Session - 08/17/22 1448     Visit Number 8    Date for PT Re-Evaluation 09/15/22    Authorization Type UHC Medicaid    Authorization - Visit Number 8    Authorization - Number of Visits 27    PT Start Time 0626    PT Stop Time 1527    PT Time Calculation (min) 41 min    Activity Tolerance Patient tolerated treatment well             Past Medical History:  Diagnosis Date   Chronic headaches    daily, bitemporal, associated with tingling in face   Heart murmur    Hypertension    Seizures ([place])    partial seizures last one on 10/22/15  On Keppra currently - last seizure 08-2018 x 2 small issues    Stroke ([place]) 07/05/2013   Past Surgical History:  Procedure Laterality Date   ABDOMINAL HYSTERECTOMY N/A 12/15/2015   Procedure: HYSTERECTOMY ABDOMINAL;  Surgeon: Emily Filbert, MD;  Location: [facility];  Service: Gynecology;  Laterality: N/A;   CESAREAN SECTION     ceserean section     COLONOSCOPY     POLYPECTOMY     SALPINGOOPHORECTOMY Bilateral 12/15/2015   Procedure: SALPINGO OOPHORECTOMY;  Surgeon: Emily Filbert, MD;  Location: [facility];  Service: Gynecology;  Laterality: Bilateral;   VENTRICULOSTOMY Left 07/07/2013   Procedure: VENTRICULOSTOMY;  Surgeon: Ophelia Charter, MD;  Location: [facility];  Service: Neurosurgery;  Laterality: Left;  Left Ventriculostomy and Removal of Right Vetriculostomy   Patient Active Problem List   Diagnosis Date Noted   Primary osteoarthritis of left knee 05/19/2022   Lumbar radiculopathy 03/11/2018   Ganglion cyst of right foot 07/04/2017   Chronic tension-type headache, not intractable 12/30/2015   Post-operative state 12/15/2015   Simple partial seizure disorder ([place]) 11/04/2015   Left sided numbness 09/25/2015   Leukopenia 09/25/2015   Tension vascular headache 09/07/2015    Meralgia paresthetica of left side 09/07/2015   Fainting spell 09/07/2015   Prediabetes 09/02/2015   Stye 08/06/2014   Essential hypertension, benign 04/16/2014   CVA (cerebral vascular accident) ([place]) 04/16/2014   Headache 04/16/2014   Tension headache 03/06/2014   Obesity, unspecified 08/13/2013   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[place]) 07/22/2013   ICH (intracerebral hemorrhage) ([place]) 07/21/2013   Hypokalemia 07/21/2013   Intracerebral hemorrhage ([place]) 07/06/2013   Essential hypertension 02/24/2009    PCP: Charlott Rakes, MD   REFERRING PROVIDER: Charlott Rakes, MD   REFERRING DIAG: [phone] (ICD-10-CM) - Chronic pain of left knee M79.651,M79.652 (ICD-10-CM) - Pain in both thighs  M25.511,G89.29 (ICD-10-CM) - Chronic right shoulder pain  THERAPY DIAG:  Chronic pain of left knee  Other abnormalities of gait and mobility  Muscle weakness (generalized)  Rationale for Evaluation and Treatment Rehabilitation  ONSET DATE: 05/15/2022 order received.  Pt reports intermittent pain since March 2023.  SUBJECTIVE:   SUBJECTIVE STATEMENT:  I did really well after last time.  I've been bragging about it.  Able to walk 45 min at the store.  That was really good.     PERTINENT HISTORY: CVA with right sided weakness in 2017, Dizziness, HTN  PAIN:  Are you having pain? Yes: NPRS scale: 3/10 Pain location: right hip pain Pain description: aching and sharp Aggravating factors: sit to stand transfer Relieving  factors: rest in certain positions   PRECAUTIONS: None  WEIGHT BEARING RESTRICTIONS No  FALLS:  Has patient fallen in last 6 months? 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   OBJECTIVE:   DIAGNOSTIC FINDINGS:  Left knee radiograph on 05/15/22:   IMPRESSION: Severe lateral and patellofemoral compartment osteoarthritis.  Right shoulder radiograph on 07/27/2022: IMPRESSION: Advanced degenerative arthritis at the [facility] joint with subacromial spurring.  PATIENT SURVEYS:  05/30/2022:  LEFS 22 / 80 = 27.5 % 07/25/2022:  Lower Extremity Functional Score: 26 / 80 = 32.5 %  COGNITION:  Overall cognitive status: Within functional limits for tasks assessed     SENSATION: WFL  MUSCLE LENGTH: Tight hamstrings bilaterally with left more impaired than right  PALPATION: Tenderness to palpation over left knee  LOWER EXTREMITY ROM:  05/30/2022: Right knee 0 to 115 degrees Left knee 5 to 110 degrees  07/25/2022: Right knee 0 to 117 degrees Left knee 3 to 112 degrees  10/19: Right knee 0 to 118 Left knee 0 to 115   UPPER EXTREMITY STRENGTH/ROM:  08/01/2022: Right shoulder flexion 120 deg, Right shoulder abduction 115 degrees Left shoulder flexion 142 deg, Left shoulder abduction 135 deg Bilateral shoulder strength of 4/5 grossly throughout with pain Grip strength:  Right 39 lbs, Left 36 lbs  LOWER EXTREMITY MMT:  05/30/2022: Right hip 4/5, Right knee 5/5 Left hip 4-/5, left knee 4/5  07/25/2022: Right hip 4 to 4+/5, Right knee 5/5 Left hip 4/5, left knee 4+/5   LOWER EXTREMITY SPECIAL TESTS:  05/30/2022:  Knee special tests: Anterior drawer test: negative and Posterior drawer test: negative  FUNCTIONAL TESTS:   05/30/2022 5 times sit to stand: 29.7 sec with use of BUE Timed up and go (TUG): 15.3 sec without assistive device, with some unsteadiness noted with 180 degree turn  07/25/2022: 5 times sit to stand: 19.6 sec without use of BUE Timed up and go (TUG): 12.9 sec without assistive device 3 min walk test:  523  ft 10/17: 3 min walk test: 530 feet  10/19:  5x sit to stand 15.13 sec without use of BUE   GAIT: Distance walked: 100 ft Assistive device utilized: None Level of assistance: Modified independence Comments: Pt with antalgic gait pattern, pt recommend that she may benefit from use of SPC  VESTIBULAR EVALUATION: 08/01/2022: Pt reports that she sleeps on multiple pillows secondary to having vertigo when she lies down in bed, particularly on her right side Dix Hallpike positive on right side    TODAY'S TREATMENT: 10/19: Nustep level 2 x10 min with PT present to discuss status Seated hip external rotation place/holds right 4x 5x sit to stand Sit to/from stand holding 5# kettlebell: x10 with overhead press Seated with 3# ankle weights:  heel/toe raises, marching, LAQ 1x10 bilat Seated hip adduction ball squeeze, clamshell with red tband, hamstring curl with red tband.  1x10 bilat each   10/17: Nustep level 2 x7 min with PT present to discuss status 3 min walk test   only 1/10 afterwards Sit to/from stand holding 5# kettlebell: x10 with overhead press Seated with 3# ankle weights:  heel/toe raises, marching, LAQ 2x10 bilat Seated hip adduction ball squeeze, clamshell with red tband, hamstring curl with red tband.  2x10 bilat each   PATIENT EDUCATION:  Education details: Issued HEP Person educated: Patient Education method: Explanation, Demonstration, and Handouts Education comprehension: verbalized understanding and returned demonstration   HOME EXERCISE PROGRAM: Access Code: [email or web address] URL: [email or web address] Date: 08/03/2022 Prepared by: Juel Burrow  Exercises - Seated Heel Toe Raises  - 1 x daily - 7 x weekly - 2 sets - 10 reps - Seated March  - 1 x daily - 7 x weekly - 2 sets - 10 reps - Seated Long Arc Quad  - 1 x daily - 7 x weekly - 2 sets - 10 reps - Seated Hip Adduction Isometrics with Ball  - 1 x daily - 7 x weekly - 2 sets - 10 reps - Supine  Quad Set  - 1 x daily - 7 x weekly - 1 sets - 10 reps - Supine Heel Slide  - 1 x daily - 7 x weekly - 1 sets - 10 reps - Active Straight Leg Raise with Quad Set  - 1 x daily - 7 x weekly - 1 sets - 10 reps - Sidelying Hip Abduction (Mirrored)  - 1 x daily - 7 x weekly - 1 sets - 10 reps - Church Pew  - 1 x daily - 7 x weekly - 3 sets - 10 reps - Retro Step  - 1 x daily - 7 x weekly - 3 sets - 10 reps - Shoulder External Rotation and Scapular Retraction with Resistance  - 1 x daily - 7 x weekly - 2 sets - 10 reps - Standing Shoulder Horizontal Abduction with Resistance  - 1 x daily - 7 x weekly - 2 sets - 10 reps - Shoulder Flexion Wall Slide with Towel  - 1 x daily - 7 x weekly - 2 sets - 10 reps - Standing Shoulder Abduction Slides at Wall  - 1 x daily - 7 x weekly - 2 sets - 10 reps   ASSESSMENT:  CLINICAL IMPRESSION: Excellent improvement in 5x sit to stand time without UE assist indicating improved strength and decreased pain. Modest improvements in knee ROM as well especially left knee extension.  "I think I had a good workout today."  Should be able to progress strengthening intensity or load next visit.     OBJECTIVE IMPAIRMENTS decreased balance, difficulty walking, decreased strength, dizziness, impaired flexibility, and pain.   ACTIVITY LIMITATIONS squatting, stairs, and transfers  PARTICIPATION LIMITATIONS: cleaning, laundry, and community activity  PERSONAL FACTORS Time since onset of injury/illness/exacerbation and 3+ comorbidities: CVA with right sided weakness, Seizures, HTN  are also affecting patient's functional outcome.   REHAB POTENTIAL: Good  CLINICAL DECISION MAKING: Evolving/moderate complexity  EVALUATION COMPLEXITY: Moderate   GOALS: Goals reviewed with patient? Yes  SHORT TERM GOALS: Target date: 06/20/2022  Pt will be independent with initial HEP. Baseline: Goal status: goal met 8/25  2.  Pt will improve TUG time to less than 14 seconds to place her  at a low risk of falling. Baseline: 15.3 sec Goal status: MET 07/25/2022   LONG TERM GOALS: Target date: 09/15/2022   Pt will be independent with advanced HEP. Baseline:  Goal status: IN PROGRESS  2.  Pt will increase LEFS to at least 50% to demonstrate improvements in functional tasks. Baseline: 27.5% Goal status: IN PROGRESS (see above)  3.  Pt will increase BLE strength to at  least 4+/5 to allow her to negotiate stairs with improved ease. Baseline:  Goal status: IN PROGRESS (see above)  4.  Pt will be able to ambulate for at least 15-20 minutes with LRAD and without reports of increased pain. Baseline:  Goal status: IN PROGRESS  5.  Pt will report at least an 80% improvement in dizziness symptoms and deny dizziness when lying in bed and rolling over. Baseline:  on 07/25/22, pt reports some dizziness in bed Goal status:  IN PROGRESS  6.  Pt will increase bilateral shoulder strength to at least 4+/5 to allow her to perform functional tasks with improved ease.  Baseline:  on 08/01/22 measured at 4/5 bilat  Goal status:  INITIAL  7.  Pt will increase right shoulder A/ROM for flexion and abduction to at least 140 degrees to allow her to reach into cabinets.  Baseline:  On 08/01/2022 measured right flexion 120 deg, right abduction 115 deg  Goal status:  INITIAL    PLAN: PT FREQUENCY: 1-2x/week  PT DURATION: 8 weeks  PLANNED INTERVENTIONS: Therapeutic exercises, Therapeutic activity, Neuromuscular re-education, Balance training, Gait training, Patient/Family education, Self Care, Joint mobilization, Joint manipulation, Stair training, Vestibular training, Canalith repositioning, Aquatic Therapy, Dry Needling, Electrical stimulation, Cryotherapy, Moist heat, Taping, Ultrasound, Ionotophoresis 57m/ml Dexamethasone, Manual therapy, and Re-evaluation  PLAN FOR NEXT SESSION:  progress LE strengthening, flexibility, balance, Dix Hallpike and canalith repositioning as indicated.  SRuben Im PT 08/17/22 3:25 PM Phone: [phone]: [phone] [facility] [place] [address]Phone # [phone] [phone]

## 2022-08-22 ENCOUNTER — Ambulatory Visit: Payer: Medicaid Other | Admitting: Physical Therapy

## 2022-08-22 DIAGNOSIS — M6281 Muscle weakness (generalized): Secondary | ICD-10-CM

## 2022-08-22 DIAGNOSIS — R2689 Other abnormalities of gait and mobility: Secondary | ICD-10-CM

## 2022-08-22 DIAGNOSIS — G8929 Other chronic pain: Secondary | ICD-10-CM

## 2022-08-22 DIAGNOSIS — M25562 Pain in left knee: Secondary | ICD-10-CM | POA: Diagnosis not present

## 2022-08-22 NOTE — Therapy (Signed)
OUTPATIENT PHYSICAL THERAPY TREATMENT NOTE   Patient Name: Alison Ward MRN: [id number] DOB:[birth year], 61 y.o., female Today's Date: 08/22/2022   PT End of Session - 08/22/22 1356     Visit Number 9    Date for PT Re-Evaluation 09/15/22    Authorization Type [employer]    Authorization - Visit Number 9    Authorization - Number of Visits 27    PT Start Time 1400    PT Stop Time 1440    PT Time Calculation (min) 40 min    Activity Tolerance Patient tolerated treatment well             Past Medical History:  Diagnosis Date   Chronic headaches    daily, bitemporal, associated with tingling in face   Heart murmur    Hypertension    Seizures ([place])    partial seizures last one on 10/22/15  On Keppra currently - last seizure 08-2018 x 2 small issues    Stroke ([place]) 07/05/2013   Past Surgical History:  Procedure Laterality Date   ABDOMINAL HYSTERECTOMY N/A 12/15/2015   Procedure: HYSTERECTOMY ABDOMINAL;  Surgeon: Emily Filbert, MD;  Location: [facility];  Service: Gynecology;  Laterality: N/A;   CESAREAN SECTION     ceserean section     COLONOSCOPY     POLYPECTOMY     SALPINGOOPHORECTOMY Bilateral 12/15/2015   Procedure: SALPINGO OOPHORECTOMY;  Surgeon: Emily Filbert, MD;  Location: [facility];  Service: Gynecology;  Laterality: Bilateral;   VENTRICULOSTOMY Left 07/07/2013   Procedure: VENTRICULOSTOMY;  Surgeon: Ophelia Charter, MD;  Location: [facility];  Service: Neurosurgery;  Laterality: Left;  Left Ventriculostomy and Removal of Right Vetriculostomy   Patient Active Problem List   Diagnosis Date Noted   Primary osteoarthritis of left knee 05/19/2022   Lumbar radiculopathy 03/11/2018   Ganglion cyst of right foot 07/04/2017   Chronic tension-type headache, not intractable 12/30/2015   Post-operative state 12/15/2015   Simple partial seizure disorder ([place]) 11/04/2015   Left sided numbness 09/25/2015   Leukopenia 09/25/2015   Tension vascular headache 09/07/2015    Meralgia paresthetica of left side 09/07/2015   Fainting spell 09/07/2015   Prediabetes 09/02/2015   Stye 08/06/2014   Essential hypertension, benign 04/16/2014   CVA (cerebral vascular accident) ([place]) 04/16/2014   Headache 04/16/2014   Tension headache 03/06/2014   Obesity, unspecified 08/13/2013   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   ICH (intracerebral hemorrhage) ([place]) 07/21/2013   Hypokalemia 07/21/2013   Intracerebral hemorrhage ([place]) 07/06/2013   Essential hypertension 02/24/2009    PCP: Charlott Rakes, MD   REFERRING PROVIDER: Charlott Rakes, MD   REFERRING DIAG: [phone] (ICD-10-CM) - Chronic pain of left knee M79.651,M79.652 (ICD-10-CM) - Pain in both thighs  M25.511,G89.29 (ICD-10-CM) - Chronic right shoulder pain  THERAPY DIAG:  Chronic pain of left knee  Other abnormalities of gait and mobility  Muscle weakness (generalized)  Rationale for Evaluation and Treatment Rehabilitation  ONSET DATE: 05/15/2022 order received.  Pt reports intermittent pain since March 2023.  SUBJECTIVE:   SUBJECTIVE STATEMENT:  Everything is looking up.  Therapy is really working. I can lift that leg higher.  My shoulder is doing fine.  No pain anywhere right now.     PERTINENT HISTORY: CVA with right sided weakness in 2017, Dizziness, HTN  PAIN:  Are you having pain? Yes: NPRS scale: 0/10 Pain location: right hip pain Pain description: aching and sharp Aggravating factors: sit to stand transfer Relieving  factors: rest in certain positions   PRECAUTIONS: None  WEIGHT BEARING RESTRICTIONS No  FALLS:  Has patient fallen in last 6 months? 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   OBJECTIVE:   DIAGNOSTIC FINDINGS:  Left knee radiograph on 05/15/22:   IMPRESSION: Severe lateral and patellofemoral compartment osteoarthritis.  Right shoulder radiograph on 07/27/2022: IMPRESSION: Advanced degenerative arthritis at the [facility] joint with subacromial spurring.  PATIENT SURVEYS:  05/30/2022:  LEFS 22 / 80 = 27.5 % 07/25/2022:  Lower Extremity Functional Score: 26 / 80 = 32.5 %  COGNITION:  Overall cognitive status: Within functional limits for tasks assessed     SENSATION: WFL  MUSCLE LENGTH: Tight hamstrings bilaterally with left more impaired than right  PALPATION: Tenderness to palpation over left knee  LOWER EXTREMITY ROM:  05/30/2022: Right knee 0 to 115 degrees Left knee 5 to 110 degrees  07/25/2022: Right knee 0 to 117 degrees Left knee 3 to 112 degrees  10/19: Right knee 0 to 118 Left knee 0 to 115   UPPER EXTREMITY STRENGTH/ROM:  08/01/2022: Right shoulder flexion 120 deg, Right shoulder abduction 115 degrees Left shoulder flexion 142 deg, Left shoulder abduction 135 deg Bilateral shoulder strength of 4/5 grossly throughout with pain Grip strength:  Right 39 lbs, Left 36 lbs  LOWER EXTREMITY MMT:  05/30/2022: Right hip 4/5, Right knee 5/5 Left hip 4-/5, left knee 4/5  07/25/2022: Right hip 4 to 4+/5, Right knee 5/5 Left hip 4/5, left knee 4+/5   LOWER EXTREMITY SPECIAL TESTS:  05/30/2022:  Knee special tests: Anterior drawer test: negative and Posterior drawer test: negative  FUNCTIONAL TESTS:   05/30/2022 5 times sit to stand: 29.7 sec with use of BUE Timed up and go (TUG): 15.3 sec without assistive device, with some unsteadiness noted with 180 degree turn  07/25/2022: 5 times sit to stand: 19.6 sec without use of BUE Timed up and go (TUG): 12.9 sec without assistive device 3 min walk test:   523 ft 10/17: 3 min walk test: 530 feet  10/19:  5x sit to stand 15.13 sec without use of BUE   10/24:   GAIT: Distance walked: 100 ft Assistive device utilized: None Level of assistance: Modified independence Comments: Pt with antalgic gait pattern, pt recommend that she may benefit from use of SPC  VESTIBULAR EVALUATION: 08/01/2022: Pt reports that she sleeps on multiple pillows secondary to having vertigo when she lies down in bed, particularly on her right side Dix Hallpike positive on right side    TODAY'S TREATMENT: 10/24: Nustep level 2 x10 min with PT present to discuss status Sit to stand with 5# overhead press 10x Sit to/from stand holding 5# kettlebell: x10 with overhead press Seated with 5# kettlebell on knee: heel/toe raises 10x right/left  Seated with 3# ankle weights:   lift up and over low cone 10x right/left , LAQ 1x10 bilat Seated hip adduction ball squeeze 20x, clamshell with green  tband, hamstring curl with green  tband.  1x10 bilat each Walk 350 feet (limited by LE fatigue  today not pain) Standing at the bar:  bil heel  raise 10x, 3 way hip 10x right/left       10/19: Nustep level 2 x10 min with PT present to discuss status Seated hip external rotation place/holds right 4x 5x sit to stand Sit to/from stand holding 5# kettlebell: x10 with overhead press Seated with 3# ankle weights:  heel/toe raises, marching, LAQ 1x10 bilat Seated hip adduction ball squeeze, clamshell with red tband, hamstring curl with red tband.  1x10 bilat each   PATIENT EDUCATION:  Education details: Issued HEP Person educated: Patient Education method: Explanation, Demonstration, and Handouts Education comprehension: verbalized understanding and returned demonstration   HOME EXERCISE PROGRAM: Access Code: [email or web address] URL: [email or web address] Date: 08/03/2022 Prepared by: Juel Burrow  Exercises - Seated Heel Toe Raises  - 1 x daily - 7 x weekly - 2  sets - 10 reps - Seated March  - 1 x daily - 7 x weekly - 2 sets - 10 reps - Seated Long Arc Quad  - 1 x daily - 7 x weekly - 2 sets - 10 reps - Seated Hip Adduction Isometrics with Ball  - 1 x daily - 7 x weekly - 2 sets - 10 reps - Supine Quad Set  - 1 x daily - 7 x weekly - 1 sets - 10 reps - Supine Heel Slide  - 1 x daily - 7 x weekly - 1 sets - 10 reps - Active Straight Leg Raise with Quad Set  - 1 x daily - 7 x weekly - 1 sets - 10 reps - Sidelying Hip Abduction (Mirrored)  - 1 x daily - 7 x weekly - 1 sets - 10 reps - [place]  - 1 x daily - 7 x weekly - 3 sets - 10 reps - Retro Step  - 1 x daily - 7 x weekly - 3 sets - 10 reps - Shoulder External Rotation and Scapular Retraction with Resistance  - 1 x daily - 7 x weekly - 2 sets - 10 reps - Standing Shoulder Horizontal Abduction with Resistance  - 1 x daily - 7 x weekly - 2 sets - 10 reps - Shoulder Flexion Wall Slide with Towel  - 1 x daily - 7 x weekly - 2 sets - 10 reps - Standing Shoulder Abduction Slides at Wall  - 1 x daily - 7 x weekly - 2 sets - 10 reps   ASSESSMENT:  CLINICAL IMPRESSION: Patient requests to focus on LEs since her shoulder has been feeling "fine." Able to perform a progress of intensity with several ex's (harder band or more reps) and a few standing weight bearing strengthening ex's without pain exacerbation.  Therapist monitoring response and modifying as needed.      OBJECTIVE IMPAIRMENTS decreased balance, difficulty walking, decreased strength, dizziness, impaired flexibility, and pain.   ACTIVITY LIMITATIONS squatting, stairs, and transfers  PARTICIPATION LIMITATIONS: cleaning, laundry, and community activity  PERSONAL FACTORS Time since onset of injury/illness/exacerbation and 3+ comorbidities: CVA with right sided weakness, Seizures, HTN  are also affecting patient's functional outcome.   REHAB POTENTIAL: Good  CLINICAL DECISION MAKING: Evolving/moderate complexity  EVALUATION COMPLEXITY:  Moderate   GOALS: Goals reviewed with patient? Yes  SHORT TERM GOALS: Target date: 06/20/2022  Pt will be independent with initial HEP. Baseline: Goal status: goal met 8/25  2.  Pt will improve TUG time to less than 14 seconds to place her at a low risk of falling. Baseline: 15.3 sec Goal status: MET 07/25/2022   LONG TERM GOALS: Target date:  09/15/2022   Pt will be independent with advanced HEP. Baseline:  Goal status: IN PROGRESS  2.  Pt will increase LEFS to at least 50% to demonstrate improvements in functional tasks. Baseline: 27.5% Goal status: IN PROGRESS (see above)  3.  Pt will increase BLE strength to at least 4+/5 to allow her to negotiate stairs with improved ease. Baseline:  Goal status: IN PROGRESS (see above)  4.  Pt will be able to ambulate for at least 15-20 minutes with LRAD and without reports of increased pain. Baseline:  Goal status: IN PROGRESS  5.  Pt will report at least an 80% improvement in dizziness symptoms and deny dizziness when lying in bed and rolling over. Baseline:  on 07/25/22, pt reports some dizziness in bed Goal status:  IN PROGRESS  6.  Pt will increase bilateral shoulder strength to at least 4+/5 to allow her to perform functional tasks with improved ease.  Baseline:  on 08/01/22 measured at 4/5 bilat  Goal status:  INITIAL  7.  Pt will increase right shoulder A/ROM for flexion and abduction to at least 140 degrees to allow her to reach into cabinets.  Baseline:  On 08/01/2022 measured right flexion 120 deg, right abduction 115 deg  Goal status:  INITIAL    PLAN: PT FREQUENCY: 1-2x/week  PT DURATION: 8 weeks  PLANNED INTERVENTIONS: Therapeutic exercises, Therapeutic activity, Neuromuscular re-education, Balance training, Gait training, Patient/Family education, Self Care, Joint mobilization, Joint manipulation, Stair training, Vestibular training, Canalith repositioning, Aquatic Therapy, Dry Needling, Electrical stimulation,  Cryotherapy, Moist heat, Taping, Ultrasound, Ionotophoresis 45m/ml Dexamethasone, Manual therapy, and Re-evaluation  PLAN FOR NEXT SESSION:  4# with LAQ;   progress LE strengthening, flexibility, balance, Dix Hallpike and canalith repositioning as indicated.   SRuben Im PT 08/22/22 2:39 PM Phone: [phone]: [phone] [facility] [place] [place] [address]Phone # [phone] [phone]

## 2022-08-24 ENCOUNTER — Ambulatory Visit: Payer: Medicaid Other | Admitting: Physical Therapy

## 2022-08-29 ENCOUNTER — Ambulatory Visit: Payer: Medicaid Other | Admitting: Physical Therapy

## 2022-08-29 DIAGNOSIS — M6281 Muscle weakness (generalized): Secondary | ICD-10-CM

## 2022-08-29 DIAGNOSIS — R2689 Other abnormalities of gait and mobility: Secondary | ICD-10-CM

## 2022-08-29 DIAGNOSIS — G8929 Other chronic pain: Secondary | ICD-10-CM

## 2022-08-29 DIAGNOSIS — M25562 Pain in left knee: Secondary | ICD-10-CM | POA: Diagnosis not present

## 2022-08-29 NOTE — Therapy (Signed)
OUTPATIENT PHYSICAL THERAPY TREATMENT NOTE   Patient Name: Alison Ward MRN: [id number] DOB:04-28-1961, 61 y.o., female Today's Date: 08/29/2022   Progress Note Reporting Period 05/30/22 to 08/29/22  See note below for Objective Data and Assessment of Progress/Goals.        PT End of Session - 08/29/22 1353     Visit Number 10    Date for PT Re-Evaluation 09/15/22    Authorization Type [employer]    Authorization - Visit Number 10    Authorization - Number of Visits 27    PT Start Time 1400    PT Stop Time 1440    PT Time Calculation (min) 40 min    Activity Tolerance Patient tolerated treatment well             Past Medical History:  Diagnosis Date   Chronic headaches    daily, bitemporal, associated with tingling in face   Heart murmur    Hypertension    Seizures ([place])    partial seizures last one on 10/22/15  On Keppra currently - last seizure 08-2018 x 2 small issues    Stroke ([place]) 07/05/2013   Past Surgical History:  Procedure Laterality Date   ABDOMINAL HYSTERECTOMY N/A 12/15/2015   Procedure: HYSTERECTOMY ABDOMINAL;  Surgeon: Emily Filbert, MD;  Location: [facility];  Service: Gynecology;  Laterality: N/A;   CESAREAN SECTION     ceserean section     COLONOSCOPY     POLYPECTOMY     SALPINGOOPHORECTOMY Bilateral 12/15/2015   Procedure: SALPINGO OOPHORECTOMY;  Surgeon: Emily Filbert, MD;  Location: [facility];  Service: Gynecology;  Laterality: Bilateral;   VENTRICULOSTOMY Left 07/07/2013   Procedure: VENTRICULOSTOMY;  Surgeon: Ophelia Charter, MD;  Location: [facility];  Service: Neurosurgery;  Laterality: Left;  Left Ventriculostomy and Removal of Right Vetriculostomy   Patient Active Problem List   Diagnosis Date Noted   Primary osteoarthritis of left knee 05/19/2022   Lumbar radiculopathy 03/11/2018   Ganglion cyst of right foot 07/04/2017   Chronic tension-type headache, not intractable 12/30/2015   Post-operative state 12/15/2015   Simple  partial seizure disorder ([place]) 11/04/2015   Left sided numbness 09/25/2015   Leukopenia 09/25/2015   Tension vascular headache 09/07/2015   Meralgia paresthetica of left side 09/07/2015   Fainting spell 09/07/2015   Prediabetes 09/02/2015   Stye 08/06/2014   Essential hypertension, benign 04/16/2014   CVA (cerebral vascular accident) ([place]) 04/16/2014   Headache 04/16/2014   Tension headache 03/06/2014   Obesity, unspecified 08/13/2013   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   ICH (intracerebral hemorrhage) ([place]) 07/21/2013   Hypokalemia 07/21/2013   Intracerebral hemorrhage ([place]) 07/06/2013   Essential hypertension 02/24/2009    PCP: Charlott Rakes, MD   REFERRING PROVIDER: Charlott Rakes, MD   REFERRING DIAG: [phone] (ICD-10-CM) - Chronic pain of left knee M79.651,M79.652 (ICD-10-CM) - Pain in both thighs  M25.511,G89.29 (ICD-10-CM) - Chronic right shoulder pain  THERAPY DIAG:  Chronic pain of left knee  Other abnormalities of gait and mobility  Muscle weakness (generalized)  Chronic right shoulder pain  Rationale for Evaluation and Treatment Rehabilitation  ONSET DATE: 05/15/2022 order received.  Pt reports intermittent pain since March 2023.  SUBJECTIVE:   SUBJECTIVE STATEMENT:  I might have slept on my shoulder wrong.  Going up and down stairs to get to my doctor's office has gotten better.  Standing has improved at church for [place].  Getting out of the car is difficult but  gotten better.    PERTINENT HISTORY: CVA with right sided weakness in 2017, Dizziness, HTN  PAIN:  Are you having pain? Yes: NPRS scale: 0/10 Pain location: right hip pain Pain description: aching and sharp Aggravating factors: sit to stand transfer Relieving factors: rest in certain positions   PRECAUTIONS: None  WEIGHT BEARING RESTRICTIONS No  FALLS:  Has patient fallen in last 6 months? 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   OBJECTIVE:   DIAGNOSTIC FINDINGS:  Left knee radiograph on 05/15/22:   IMPRESSION: Severe lateral and patellofemoral compartment osteoarthritis.  Right shoulder radiograph on 07/27/2022: IMPRESSION: Advanced degenerative arthritis at the [facility] joint with subacromial spurring.  PATIENT SURVEYS:  05/30/2022:  LEFS 22 / 80 = 27.5 % 07/25/2022:  Lower Extremity Functional Score: 26 / 80 = 32.5 % 10/31:  LEFS  32/80 40%   COGNITION:  Overall cognitive status: Within functional limits for tasks assessed     SENSATION: WFL  MUSCLE LENGTH: Tight hamstrings bilaterally with left more impaired than right  PALPATION: Tenderness to palpation over left knee  LOWER EXTREMITY ROM:  05/30/2022: Right knee 0 to 115 degrees Left knee 5 to 110 degrees  07/25/2022: Right knee 0 to 117 degrees Left knee 3 to 112 degrees  10/19: Right knee 0 to 118 Left knee 0 to 115  10/31:  Right knee 0 to 118 Left knee 0 to 123    10/31:   UPPER EXTREMITY STRENGTH/ROM:  08/01/2022: Right shoulder flexion 120 deg, Right shoulder abduction 115 degrees Left shoulder flexion 142 deg, Left shoulder abduction 135 deg Bilateral shoulder strength of 4/5 grossly throughout with pain Grip strength:  Right 39 lbs, Left 36 lbs  10/31: right shoulder flexion 145  deg, right shoulder abduction   146 degrees Left shoulder flexion 148 deg, Left shoulder abduction 161 deg Bilateral shoulder strength of 4/5 grossly throughout with pain  LOWER EXTREMITY MMT:  05/30/2022: Right hip 4/5, Right knee 5/5 Left hip 4-/5, left  knee 4/5  07/25/2022: Right hip 4 to 4+/5, Right knee 5/5 Left hip 4/5, left knee 4+/5  10/31: Right hip 4 to 4+/5, Right knee 5/5 Left hip 4/5, left knee 4+/5  LOWER EXTREMITY SPECIAL TESTS:  05/30/2022:  Knee special tests: Anterior drawer test: negative and Posterior drawer test: negative  FUNCTIONAL TESTS:   05/30/2022 5 times sit to stand: 29.7 sec with use of BUE Timed up and go (TUG): 15.3 sec without assistive device, with some unsteadiness noted with 180 degree turn  07/25/2022: 5 times sit to stand: 19.6 sec without use of BUE Timed up and go (TUG): 12.9 sec without assistive device 3 min walk test:  523 ft 10/17: 3 min walk test: 530 feet  10/19:  5x sit to stand 15.13 sec without use of BUE   10/31:  TUG 11.15 no loss of balance  5x sit to stand  16.8 no UE assist    GAIT: Distance walked: 100 ft Assistive device utilized: None Level of assistance: Modified independence Comments: Pt with antalgic gait pattern, pt recommend that she may benefit from use of SPC  VESTIBULAR  EVALUATION: 08/01/2022: Pt reports that she sleeps on multiple pillows secondary to having vertigo when she lies down in bed, particularly on her right side Dix Hallpike positive on right side    TODAY'S TREATMENT: 10/31: Nustep level 2 x10 min with PT present to discuss status LEFS ROM shoulder MMT TUG 5x sit to stand test   Seated with 4# ankle weights:   lift up and over low cone 10x right/left , LAQ 1x10 bilat Seated hip adduction ball squeeze 20x, clamshell with green  tband, hamstring curl with green  tband.  1x10 bilat each    10/24: Nustep level 2 x10 min with PT present to discuss status Sit to stand with 5# overhead press 10x Sit to/from stand holding 5# kettlebell: x10 with overhead press Seated with 5# kettlebell on knee: heel/toe raises 10x right/left  Seated with 3# ankle weights:   lift up and over low cone 10x right/left , LAQ 1x10 bilat Seated hip adduction ball  squeeze 20x, clamshell with green  tband, hamstring curl with green  tband.  1x10 bilat each Walk 350 feet (limited by LE fatigue  today not pain) Standing at the bar:  bil heel raise 10x, 3 way hip 10x right/left       10/19: Nustep level 2 x10 min with PT present to discuss status Seated hip external rotation place/holds right 4x 5x sit to stand Sit to/from stand holding 5# kettlebell: x10 with overhead press Seated with 3# ankle weights:  heel/toe raises, marching, LAQ 1x10 bilat Seated hip adduction ball squeeze, clamshell with red tband, hamstring curl with red tband.  1x10 bilat each   PATIENT EDUCATION:  Education details: Issued HEP Person educated: Patient Education method: Explanation, Demonstration, and Handouts Education comprehension: verbalized understanding and returned demonstration   HOME EXERCISE PROGRAM: Access Code: [email or web address] URL: [email or web address] Date: 08/03/2022 Prepared by: Juel Burrow  Exercises - Seated Heel Toe Raises  - 1 x daily - 7 x weekly - 2 sets - 10 reps - Seated March  - 1 x daily - 7 x weekly - 2 sets - 10 reps - Seated Long Arc Quad  - 1 x daily - 7 x weekly - 2 sets - 10 reps - Seated Hip Adduction Isometrics with Ball  - 1 x daily - 7 x weekly - 2 sets - 10 reps - Supine Quad Set  - 1 x daily - 7 x weekly - 1 sets - 10 reps - Supine Heel Slide  - 1 x daily - 7 x weekly - 1 sets - 10 reps - Active Straight Leg Raise with Quad Set  - 1 x daily - 7 x weekly - 1 sets - 10 reps - Sidelying Hip Abduction (Mirrored)  - 1 x daily - 7 x weekly - 1 sets - 10 reps - [place]  - 1 x daily - 7 x weekly - 3 sets - 10 reps - Retro Step  - 1 x daily - 7 x weekly - 3 sets - 10 reps - Shoulder External Rotation and Scapular Retraction with Resistance  - 1 x daily - 7 x weekly - 2 sets - 10 reps - Standing Shoulder Horizontal Abduction with Resistance  - 1 x daily - 7 x weekly - 2 sets - 10 reps - Shoulder Flexion Wall Slide with  Towel  - 1 x daily - 7 x weekly - 2 sets - 10 reps - Standing Shoulder Abduction Slides at Wall  - 1 x daily -  7 x weekly - 2 sets - 10 reps   ASSESSMENT:  CLINICAL IMPRESSION: Noted improvements in knee and shoulder ROM and strength objective measurements.  Improved LEFS outcome score and functional retests.  The patient would benefit from a continuation of skilled PT for a further progression of strengthening and functional mobility.  Will continue to update and promote independence in a HEP needed for a return to the highest functional level possible with ADLs.      OBJECTIVE IMPAIRMENTS decreased balance, difficulty walking, decreased strength, dizziness, impaired flexibility, and pain.   ACTIVITY LIMITATIONS squatting, stairs, and transfers  PARTICIPATION LIMITATIONS: cleaning, laundry, and community activity  PERSONAL FACTORS Time since onset of injury/illness/exacerbation and 3+ comorbidities: CVA with right sided weakness, Seizures, HTN  are also affecting patient's functional outcome.   REHAB POTENTIAL: Good  CLINICAL DECISION MAKING: Evolving/moderate complexity  EVALUATION COMPLEXITY: Moderate   GOALS: Goals reviewed with patient? Yes  SHORT TERM GOALS: Target date: 06/20/2022  Pt will be independent with initial HEP. Baseline: Goal status: goal met 8/25  2.  Pt will improve TUG time to less than 14 seconds to place her at a low risk of falling. Baseline: 15.3 sec Goal status: MET 07/25/2022   LONG TERM GOALS: Target date: 09/15/2022   Pt will be independent with advanced HEP. Baseline:  Goal status: IN PROGRESS  2.  Pt will increase LEFS to at least 50% to demonstrate improvements in functional tasks. Baseline:  Goal status: IN PROGRESS (see above)  3.  Pt will increase BLE strength to at least 4+/5 to allow her to negotiate stairs with improved ease. Baseline:  Goal status: IN PROGRESS (see above)  4.  Pt will be able to ambulate for at least 15-20  minutes with LRAD and without reports of increased pain. Baseline:  Goal status: IN PROGRESS  5.  Pt will report at least an 80% improvement in dizziness symptoms and deny dizziness when lying in bed and rolling over. Baseline:  on 07/25/22, pt reports some dizziness in bed Goal status:  IN PROGRESS  6.  Pt will increase bilateral shoulder strength to at least 4+/5 to allow her to perform functional tasks with improved ease.  Baseline:  on 08/01/22 measured at 4/5 bilat  Goal status:  in progress  7.  Pt will increase right shoulder A/ROM for flexion and abduction to at least 140 degrees to allow her to reach into cabinets.  Baseline:  On 08/01/2022 measured right flexion 120 deg, right abduction 115 deg  Goal status:  goal met 10/31    PLAN: PT FREQUENCY: 1-2x/week  PT DURATION: 8 weeks  PLANNED INTERVENTIONS: Therapeutic exercises, Therapeutic activity, Neuromuscular re-education, Balance training, Gait training, Patient/Family education, Self Care, Joint mobilization, Joint manipulation, Stair training, Vestibular training, Canalith repositioning, Aquatic Therapy, Dry Needling, Electrical stimulation, Cryotherapy, Moist heat, Taping, Ultrasound, Ionotophoresis 68m/ml Dexamethasone, Manual therapy, and Re-evaluation  PLAN FOR NEXT SESSION:  4# with LAQ;  green band;  progress LE strengthening, flexibility, balance, Dix Hallpike and canalith repositioning as indicated.   SRuben Im PT 08/29/22 2:37 PM Phone: [phone]: [phone] [facility] [place] [id number] # [phone] [phone]

## 2022-08-31 ENCOUNTER — Encounter: Payer: Self-pay | Admitting: Rehabilitative and Restorative Service Providers"

## 2022-08-31 ENCOUNTER — Ambulatory Visit: Payer: Medicaid Other | Attending: Family Medicine | Admitting: Rehabilitative and Restorative Service Providers"

## 2022-08-31 DIAGNOSIS — M25511 Pain in right shoulder: Secondary | ICD-10-CM | POA: Insufficient documentation

## 2022-08-31 DIAGNOSIS — G8929 Other chronic pain: Secondary | ICD-10-CM | POA: Insufficient documentation

## 2022-08-31 DIAGNOSIS — M6281 Muscle weakness (generalized): Secondary | ICD-10-CM | POA: Insufficient documentation

## 2022-08-31 DIAGNOSIS — R42 Dizziness and giddiness: Secondary | ICD-10-CM | POA: Diagnosis present

## 2022-08-31 DIAGNOSIS — M25562 Pain in left knee: Secondary | ICD-10-CM | POA: Insufficient documentation

## 2022-08-31 DIAGNOSIS — R2689 Other abnormalities of gait and mobility: Secondary | ICD-10-CM | POA: Insufficient documentation

## 2022-08-31 NOTE — Therapy (Signed)
OUTPATIENT PHYSICAL THERAPY TREATMENT NOTE   Patient Name: Alison Ward MRN: [id number] DOB:[birth year], 61 y.o., female Today's Date: 08/31/2022   Progress Note Reporting Period 05/30/22 to 08/29/22  See note below for Objective Data and Assessment of Progress/Goals.        PT End of Session - 08/31/22 1231     Visit Number 11    Date for PT Re-Evaluation 09/15/22    Authorization Type [employer]    Authorization - Visit Number 11    Authorization - Number of Visits 27    PT Start Time 1230    PT Stop Time 1310    PT Time Calculation (min) 40 min    Activity Tolerance Patient tolerated treatment well    Behavior During Therapy WFL for tasks assessed/performed             Past Medical History:  Diagnosis Date   Chronic headaches    daily, bitemporal, associated with tingling in face   Heart murmur    Hypertension    Seizures ([place])    partial seizures last one on 10/22/15  On Keppra currently - last seizure 08-2018 x 2 small issues    Stroke ([place]) 07/05/2013   Past Surgical History:  Procedure Laterality Date   ABDOMINAL HYSTERECTOMY N/A 12/15/2015   Procedure: HYSTERECTOMY ABDOMINAL;  Surgeon: Emily Filbert, MD;  Location: [facility];  Service: Gynecology;  Laterality: N/A;   CESAREAN SECTION     ceserean section     COLONOSCOPY     POLYPECTOMY     SALPINGOOPHORECTOMY Bilateral 12/15/2015   Procedure: SALPINGO OOPHORECTOMY;  Surgeon: Emily Filbert, MD;  Location: [facility];  Service: Gynecology;  Laterality: Bilateral;   VENTRICULOSTOMY Left 07/07/2013   Procedure: VENTRICULOSTOMY;  Surgeon: Ophelia Charter, MD;  Location: [facility];  Service: Neurosurgery;  Laterality: Left;  Left Ventriculostomy and Removal of Right Vetriculostomy   Patient Active Problem List   Diagnosis Date Noted   Primary osteoarthritis of left knee 05/19/2022   Lumbar radiculopathy 03/11/2018   Ganglion cyst of right foot 07/04/2017   Chronic tension-type headache, not intractable  12/30/2015   Post-operative state 12/15/2015   Simple partial seizure disorder ([place]) 11/04/2015   Left sided numbness 09/25/2015   Leukopenia 09/25/2015   Tension vascular headache 09/07/2015   Meralgia paresthetica of left side 09/07/2015   Fainting spell 09/07/2015   Prediabetes 09/02/2015   Stye 08/06/2014   Essential hypertension, benign 04/16/2014   CVA (cerebral vascular accident) ([place]) 04/16/2014   Headache 04/16/2014   Tension headache 03/06/2014   Obesity, unspecified 08/13/2013   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   ICH (intracerebral hemorrhage) ([place]) 07/21/2013   Hypokalemia 07/21/2013   Intracerebral hemorrhage ([place]) 07/06/2013   Essential hypertension 02/24/2009    PCP: Charlott Rakes, MD   REFERRING PROVIDER: Charlott Rakes, MD   REFERRING DIAG: [phone] (ICD-10-CM) - Chronic pain of left knee M79.651,M79.652 (ICD-10-CM) - Pain in both thighs  M25.511,G89.29 (ICD-10-CM) - Chronic right shoulder pain  THERAPY DIAG:  Chronic pain of left knee  Other abnormalities of gait and mobility  Muscle weakness (generalized)  Chronic right shoulder pain  Dizziness and giddiness  Rationale for Evaluation and Treatment Rehabilitation  ONSET DATE: 05/15/2022 order received.  Pt reports intermittent pain since March 2023.  SUBJECTIVE:   SUBJECTIVE STATEMENT:  Pt reports feeling better overall.   PERTINENT HISTORY: CVA with right sided weakness in 2017, Dizziness, HTN  PAIN:  Are you having pain? Yes:  NPRS scale: 0/10 Pain location: right hip pain Pain description: aching and sharp Aggravating factors: sit to stand transfer Relieving factors: rest in certain positions   PRECAUTIONS: None  WEIGHT BEARING RESTRICTIONS No  FALLS:  Has patient fallen in last 6 months? 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   OBJECTIVE:   DIAGNOSTIC FINDINGS:  Left knee radiograph on 05/15/22:   IMPRESSION: Severe lateral and patellofemoral compartment osteoarthritis.  Right shoulder radiograph on 07/27/2022: IMPRESSION: Advanced degenerative arthritis at the [facility] joint with subacromial spurring.  PATIENT SURVEYS:  05/30/2022:  LEFS 22 / 80 = 27.5 % 07/25/2022:  Lower Extremity Functional Score: 26 / 80 = 32.5 % 10/31:  LEFS  32/80 40%   COGNITION:  Overall cognitive status: Within functional limits for tasks assessed     SENSATION: WFL  MUSCLE LENGTH: Tight hamstrings bilaterally with left more impaired than right  PALPATION: Tenderness to palpation over left knee  LOWER EXTREMITY ROM:  05/30/2022: Right knee 0 to 115 degrees Left knee 5 to 110 degrees  07/25/2022: Right knee 0 to 117 degrees Left knee 3 to 112 degrees  10/19: Right knee 0 to 118 Left knee 0 to 115  10/31:  Right knee 0 to 118 Left knee 0 to 123    10/31:   UPPER EXTREMITY STRENGTH/ROM:  08/01/2022: Right shoulder flexion 120 deg, Right shoulder abduction 115 degrees Left shoulder flexion 142 deg, Left shoulder abduction 135 deg Bilateral shoulder strength of 4/5 grossly throughout with pain Grip strength:  Right 39 lbs, Left 36 lbs  10/31:  right shoulder flexion 145  deg, right shoulder abduction   146 degrees Left shoulder flexion 148 deg, Left shoulder abduction 161 deg Bilateral shoulder strength of 4/5 grossly throughout with pain  LOWER EXTREMITY MMT:  05/30/2022: Right hip 4/5, Right knee 5/5 Left hip 4-/5, left knee 4/5  07/25/2022: Right hip 4 to 4+/5, Right knee 5/5 Left hip 4/5, left knee 4+/5  10/31:  Right  hip 4 to 4+/5, Right knee 5/5 Left hip 4/5, left knee 4+/5  LOWER EXTREMITY SPECIAL TESTS:  05/30/2022:  Knee special tests: Anterior drawer test: negative and Posterior drawer test: negative  FUNCTIONAL TESTS:   05/30/2022 5 times sit to stand: 29.7 sec with use of BUE Timed up and go (TUG): 15.3 sec without assistive device, with some unsteadiness noted with 180 degree turn  07/25/2022: 5 times sit to stand: 19.6 sec without use of BUE Timed up and go (TUG): 12.9 sec without assistive device 3 min walk test:  523 ft  10/17: 3 min walk test: 530 feet  10/19:  5x sit to stand 15.13 sec without use of BUE   10/31:   TUG 11.15 no loss of balance  5x sit to stand  16.8 no UE assist    GAIT: Distance walked: 100 ft Assistive device utilized: None Level of assistance: Modified independence Comments: Pt with antalgic gait pattern, pt recommend that she may benefit from use of SPC  VESTIBULAR EVALUATION: 08/01/2022: Pt reports that she sleeps on multiple pillows secondary to having vertigo when she lies down in bed,  particularly on her right side Dix Hallpike positive on right side    TODAY'S TREATMENT: 08/31/2022: Nustep level 5 x8 min with PT present to discuss status Sit to/from stand holding 5# kettlebell:  x10 with chest press, x10 with overhead press FWD and lateral 6" step ups with UE support x10 bilat each Standing lat pull 30# 2x10 Seated on foam cushion with 7# on thigh with LE over small hurdle and back 2x10 bilat (cuing for improved hip flexion instead of LAQ) Standing 3 way scapular stabilization on wall with green loop 2x5 bilat   10/31: Nustep level 2 x10 min with PT present to discuss status LEFS ROM shoulder MMT TUG 5x sit to stand test   Seated with 4# ankle weights:   lift up and over low cone 10x right/left , LAQ 1x10 bilat Seated hip adduction ball squeeze 20x, clamshell with green  tband, hamstring curl with green  tband.  1x10 bilat  each    10/24: Nustep level 2 x10 min with PT present to discuss status Sit to stand with 5# overhead press 10x Sit to/from stand holding 5# kettlebell: x10 with overhead press Seated with 5# kettlebell on knee: heel/toe raises 10x right/left  Seated with 3# ankle weights:   lift up and over low cone 10x right/left , LAQ 1x10 bilat Seated hip adduction ball squeeze 20x, clamshell with green  tband, hamstring curl with green  tband.  1x10 bilat each Walk 350 feet (limited by LE fatigue  today not pain) Standing at the bar:  bil heel raise 10x, 3 way hip 10x right/left     PATIENT EDUCATION:  Education details: Issued HEP Person educated: Patient Education method: Explanation, Demonstration, and Handouts Education comprehension: verbalized understanding and returned demonstration   HOME EXERCISE PROGRAM: Access Code: [email or web address] URL: [email or web address] Date: 08/03/2022 Prepared by: Juel Burrow  Exercises - Seated Heel Toe Raises  - 1 x daily - 7 x weekly - 2 sets - 10 reps - Seated March  - 1 x daily - 7 x weekly - 2 sets - 10 reps - Seated Long Arc Quad  - 1 x daily - 7 x weekly - 2 sets - 10 reps - Seated Hip Adduction Isometrics with Ball  - 1 x daily - 7 x weekly - 2 sets - 10 reps - Supine Quad Set  - 1 x daily - 7 x weekly - 1 sets - 10 reps - Supine Heel Slide  - 1 x daily - 7 x weekly - 1 sets - 10 reps - Active Straight Leg Raise with Quad Set  - 1 x daily - 7 x weekly - 1 sets - 10 reps - Sidelying Hip Abduction (Mirrored)  - 1 x daily - 7 x weekly - 1 sets - 10 reps - [place]  - 1 x daily - 7 x weekly - 3 sets - 10 reps - Retro Step  - 1 x daily - 7 x weekly - 3 sets - 10 reps - Shoulder External Rotation and Scapular Retraction with Resistance  - 1 x daily - 7 x weekly - 2 sets - 10 reps - Standing Shoulder Horizontal Abduction with Resistance  - 1 x daily - 7 x weekly - 2 sets - 10 reps - Shoulder Flexion Wall Slide with Towel  - 1 x daily - 7 x  weekly - 2 sets - 10 reps - Standing Shoulder Abduction Slides at Wall  - 1 x daily - 7 x weekly -  2 sets - 10 reps   ASSESSMENT:  CLINICAL IMPRESSION: Ms Firkus presents to skilled PT with reports of 100% improvements in dizziness since initial evaluation.  Pt states that she is able to ambulate for at least 20 minutes before she starts noting increased pain.  Pt with difficulty with seated hip flex/abduction over small hurdle and needed to sit on a cushion to elevate her to allow her to flex her hip enough for clearance of hurdle.  Pt able to progress with exercises today with minimal cuing for technique and positioning.  Pt continues to require skilled PT to progress towards goal related activities.   OBJECTIVE IMPAIRMENTS decreased balance, difficulty walking, decreased strength, dizziness, impaired flexibility, and pain.   ACTIVITY LIMITATIONS squatting, stairs, and transfers  PARTICIPATION LIMITATIONS: cleaning, laundry, and community activity  PERSONAL FACTORS Time since onset of injury/illness/exacerbation and 3+ comorbidities: CVA with right sided weakness, Seizures, HTN  are also affecting patient's functional outcome.   REHAB POTENTIAL: Good  CLINICAL DECISION MAKING: Evolving/moderate complexity  EVALUATION COMPLEXITY: Moderate   GOALS: Goals reviewed with patient? Yes  SHORT TERM GOALS: Target date: 06/20/2022  Pt will be independent with initial HEP. Baseline: Goal status: goal met 8/25  2.  Pt will improve TUG time to less than 14 seconds to place her at a low risk of falling. Baseline: 15.3 sec Goal status: MET 07/25/2022   LONG TERM GOALS: Target date: 09/15/2022   Pt will be independent with advanced HEP. Baseline:  Goal status: IN PROGRESS  2.  Pt will increase LEFS to at least 50% to demonstrate improvements in functional tasks. Baseline:  Goal status: IN PROGRESS (see above)  3.  Pt will increase BLE strength to at least 4+/5 to allow her to  negotiate stairs with improved ease. Baseline:  Goal status: IN PROGRESS (see above)  4.  Pt will be able to ambulate for at least 15-20 minutes with LRAD and without reports of increased pain. Baseline:  Goal status: MET on 08/31/2022  5.  Pt will report at least an 80% improvement in dizziness symptoms and deny dizziness when lying in bed and rolling over. Baseline:  on 07/25/22, pt reports some dizziness in bed Goal status:  MET on 08/31/2022  6.  Pt will increase bilateral shoulder strength to at least 4+/5 to allow her to perform functional tasks with improved ease.  Baseline:  on 08/01/22 measured at 4/5 bilat  Goal status:  in progress  7.  Pt will increase right shoulder A/ROM for flexion and abduction to at least 140 degrees to allow her to reach into cabinets.  Baseline:  On 08/01/2022 measured right flexion 120 deg, right abduction 115 deg  Goal status:  goal met 10/31    PLAN: PT FREQUENCY: 1-2x/week  PT DURATION: 8 weeks  PLANNED INTERVENTIONS: Therapeutic exercises, Therapeutic activity, Neuromuscular re-education, Balance training, Gait training, Patient/Family education, Self Care, Joint mobilization, Joint manipulation, Stair training, Vestibular training, Canalith repositioning, Aquatic Therapy, Dry Needling, Electrical stimulation, Cryotherapy, Moist heat, Taping, Ultrasound, Ionotophoresis 83m/ml Dexamethasone, Manual therapy, and Re-evaluation  PLAN FOR NEXT SESSION:  4# with LAQ;  green band;  progress LE strengthening, flexibility, balance, Dix Hallpike and canalith repositioning as indicated.   SJuel Burrow PT 08/31/22 1:19 PM  [facility]Al[facility] SystemSpecial[facility] Rehab Services [address] [place] [address]Phone # [phone] [phone]

## 2022-09-05 ENCOUNTER — Ambulatory Visit: Payer: Medicaid Other | Admitting: Physical Therapy

## 2022-09-05 DIAGNOSIS — R2689 Other abnormalities of gait and mobility: Secondary | ICD-10-CM

## 2022-09-05 DIAGNOSIS — G8929 Other chronic pain: Secondary | ICD-10-CM

## 2022-09-05 DIAGNOSIS — M6281 Muscle weakness (generalized): Secondary | ICD-10-CM

## 2022-09-05 DIAGNOSIS — M25562 Pain in left knee: Secondary | ICD-10-CM | POA: Diagnosis not present

## 2022-09-05 NOTE — Therapy (Signed)
OUTPATIENT PHYSICAL THERAPY TREATMENT NOTE   Patient Name: Alison Ward MRN: [id number] DOB:09-Nov-1960, 61 y.o., female Today's Date: 09/05/2022      PT End of Session - 09/05/22 1357     Visit Number 12    Date for PT Re-Evaluation 09/15/22    Authorization Type [employer] Medicaid    Authorization - Visit Number 12    Authorization - Number of Visits 27    PT Start Time 1400    PT Stop Time 1440    PT Time Calculation (min) 40 min    Activity Tolerance Patient tolerated treatment well             Past Medical History:  Diagnosis Date   Chronic headaches    daily, bitemporal, associated with tingling in face   Heart murmur    Hypertension    Seizures ([place])    partial seizures last one on 10/22/15  On Keppra currently - last seizure 08-2018 x 2 small issues    Stroke ([place]) 07/05/2013   Past Surgical History:  Procedure Laterality Date   ABDOMINAL HYSTERECTOMY N/A 12/15/2015   Procedure: HYSTERECTOMY ABDOMINAL;  Surgeon: Emily Filbert, MD;  Location: [facility];  Service: Gynecology;  Laterality: N/A;   CESAREAN SECTION     ceserean section     COLONOSCOPY     POLYPECTOMY     SALPINGOOPHORECTOMY Bilateral 12/15/2015   Procedure: SALPINGO OOPHORECTOMY;  Surgeon: Emily Filbert, MD;  Location: [facility];  Service: Gynecology;  Laterality: Bilateral;   VENTRICULOSTOMY Left 07/07/2013   Procedure: VENTRICULOSTOMY;  Surgeon: Ophelia Charter, MD;  Location: [facility];  Service: Neurosurgery;  Laterality: Left;  Left Ventriculostomy and Removal of Right Vetriculostomy   Patient Active Problem List   Diagnosis Date Noted   Primary osteoarthritis of left knee 05/19/2022   Lumbar radiculopathy 03/11/2018   Ganglion cyst of right foot 07/04/2017   Chronic tension-type headache, not intractable 12/30/2015   Post-operative state 12/15/2015   Simple partial seizure disorder ([place]) 11/04/2015   Left sided numbness 09/25/2015   Leukopenia 09/25/2015   Tension vascular headache  09/07/2015   Meralgia paresthetica of left side 09/07/2015   Fainting spell 09/07/2015   Prediabetes 09/02/2015   Stye 08/06/2014   Essential hypertension, benign 04/16/2014   CVA (cerebral vascular accident) ([place]) 04/16/2014   Headache 04/16/2014   Tension headache 03/06/2014   Obesity, unspecified 08/13/2013   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   ICH (intracerebral hemorrhage) ([place]) 07/21/2013   Hypokalemia 07/21/2013   Intracerebral hemorrhage ([place]) 07/06/2013   Essential hypertension 02/24/2009    PCP: Charlott Rakes, MD   REFERRING PROVIDER: Charlott Rakes, MD   REFERRING DIAG: [phone] (ICD-10-CM) - Chronic pain of left knee M79.651,M79.652 (ICD-10-CM) - Pain in both thighs  M25.511,G89.29 (ICD-10-CM) - Chronic right shoulder pain  THERAPY DIAG:  Chronic pain of left knee  Other abnormalities of gait and mobility  Muscle weakness (generalized)  Chronic right shoulder pain  Rationale for Evaluation and Treatment Rehabilitation  ONSET DATE: 05/15/2022 order received.  Pt reports intermittent pain since March 2023.  SUBJECTIVE:   SUBJECTIVE STATEMENT:  Was able to shop at [facility] for 2 hours! I haven't been able to do that for months.  Was able to stand at church.  It was a good weekend.  I'm doing better putting on my pants.   PERTINENT HISTORY: CVA with right sided weakness in 2017, Dizziness, HTN  PAIN:  Are you having pain? Yes: NPRS scale:  0/10 Pain location: right hip pain Pain description: aching and sharp Aggravating factors: sit to stand transfer Relieving factors: rest in certain positions   PRECAUTIONS: None  WEIGHT BEARING RESTRICTIONS No  FALLS:  Has patient fallen in last 6 months? 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   OBJECTIVE:   DIAGNOSTIC FINDINGS:  Left knee radiograph on 05/15/22:   IMPRESSION: Severe lateral and patellofemoral compartment osteoarthritis.  Right shoulder radiograph on 07/27/2022: IMPRESSION: Advanced degenerative arthritis at the [facility] joint with subacromial spurring.  PATIENT SURVEYS:  05/30/2022:  LEFS 22 / 80 = 27.5 % 07/25/2022:  Lower Extremity Functional Score: 26 / 80 = 32.5 % 10/31:  LEFS  32/80 40%   COGNITION:  Overall cognitive status: Within functional limits for tasks assessed     SENSATION: WFL  MUSCLE LENGTH: Tight hamstrings bilaterally with left more impaired than right  PALPATION: Tenderness to palpation over left knee  LOWER EXTREMITY ROM:  05/30/2022: Right knee 0 to 115 degrees Left knee 5 to 110 degrees  07/25/2022: Right knee 0 to 117 degrees Left knee 3 to 112 degrees  10/19: Right knee 0 to 118 Left knee 0 to 115  10/31:  Right knee 0 to 118 Left knee 0 to 123    10/31:   UPPER EXTREMITY STRENGTH/ROM:  08/01/2022: Right shoulder flexion 120 deg, Right shoulder abduction 115 degrees Left shoulder flexion 142 deg, Left shoulder abduction 135 deg Bilateral shoulder strength of 4/5 grossly throughout with pain Grip strength:  Right 39 lbs, Left 36 lbs  10/31:  right shoulder flexion 145  deg, right shoulder abduction   146 degrees Left shoulder flexion 148 deg, Left shoulder abduction 161 deg Bilateral shoulder strength of 4/5 grossly throughout with pain  LOWER EXTREMITY MMT:  05/30/2022: Right hip 4/5, Right knee 5/5 Left hip 4-/5, left knee 4/5  07/25/2022: Right hip 4 to 4+/5, Right knee 5/5 Left hip 4/5, left knee 4+/5  10/31:  Right hip 4 to 4+/5, Right knee 5/5 Left hip 4/5, left knee 4+/5  LOWER  EXTREMITY SPECIAL TESTS:  05/30/2022:  Knee special tests: Anterior drawer test: negative and Posterior drawer test: negative  FUNCTIONAL TESTS:   05/30/2022 5 times sit to stand: 29.7 sec with use of BUE Timed up and go (TUG): 15.3 sec without assistive device, with some unsteadiness noted with 180 degree turn  07/25/2022: 5 times sit to stand: 19.6 sec without use of BUE Timed up and go (TUG): 12.9 sec without assistive device 3 min walk test:  523 ft  10/17: 3 min walk test: 530 feet  10/19:  5x sit to stand 15.13 sec without use of BUE   10/31:   TUG 11.15 no loss of balance  5x sit to stand  16.8 no UE assist    GAIT: Distance walked: 100 ft Assistive device utilized: None Level of assistance: Modified independence Comments: Pt with antalgic gait pattern, pt recommend that she may benefit from use of SPC  VESTIBULAR EVALUATION: 08/01/2022: Pt reports that she sleeps on multiple pillows secondary to having vertigo when she lies down in bed, particularly on  her right side Dix Hallpike positive on right side    TODAY'S TREATMENT:   09/05/22: Nustep level 5 x10 min with PT present to discuss status Sit to/from stand holding 5# kettlebell:  x10 with chest press, x10 with overhead press FWD and lateral 6" step ups with UE support x10 bilat each Standing lat pull 30# 2x10 Seated on foam cushion with 7# on thigh with LE over small hurdle and back 2x10 bilat (cuing for improved hip flexion instead of LAQ) Standing 3 way scapular stabilization on wall with green loop 2x5 bilat Forward cable walk 10# 10x with belt Backward cable walk 15# 8x with belt      08/31/2022: Nustep level 5 x8 min with PT present to discuss status Sit to/from stand holding 5# kettlebell:  x10 with chest press, x10 with overhead press FWD and lateral 6" step ups with UE support x10 bilat each Standing lat pull 30# 2x10 Seated on foam cushion with 7# on thigh with LE over small hurdle and back 2x10  bilat (cuing for improved hip flexion instead of LAQ) Standing 3 way scapular stabilization on wall with green loop 2x5 bilat   PATIENT EDUCATION:  Education details: Issued HEP Person educated: Patient Education method: Explanation, Demonstration, and Handouts Education comprehension: verbalized understanding and returned demonstration   HOME EXERCISE PROGRAM: Access Code: [email or web address] URL: [email or web address] Date: 08/03/2022 Prepared by: Juel Burrow  Exercises - Seated Heel Toe Raises  - 1 x daily - 7 x weekly - 2 sets - 10 reps - Seated March  - 1 x daily - 7 x weekly - 2 sets - 10 reps - Seated Long Arc Quad  - 1 x daily - 7 x weekly - 2 sets - 10 reps - Seated Hip Adduction Isometrics with Ball  - 1 x daily - 7 x weekly - 2 sets - 10 reps - Supine Quad Set  - 1 x daily - 7 x weekly - 1 sets - 10 reps - Supine Heel Slide  - 1 x daily - 7 x weekly - 1 sets - 10 reps - Active Straight Leg Raise with Quad Set  - 1 x daily - 7 x weekly - 1 sets - 10 reps - Sidelying Hip Abduction (Mirrored)  - 1 x daily - 7 x weekly - 1 sets - 10 reps - [place]  - 1 x daily - 7 x weekly - 3 sets - 10 reps - Retro Step  - 1 x daily - 7 x weekly - 3 sets - 10 reps - Shoulder External Rotation and Scapular Retraction with Resistance  - 1 x daily - 7 x weekly - 2 sets - 10 reps - Standing Shoulder Horizontal Abduction with Resistance  - 1 x daily - 7 x weekly - 2 sets - 10 reps - Shoulder Flexion Wall Slide with Towel  - 1 x daily - 7 x weekly - 2 sets - 10 reps - Standing Shoulder Abduction Slides at Wall  - 1 x daily - 7 x weekly - 2 sets - 10 reps   ASSESSMENT:  CLINICAL IMPRESSION: Verbal cues to optimize activation of periscapular muscles and to limit compensatory substitutions.  Steady progression of exercise intensity.  Therapist monitoring response to all interventions and modifying treatment accordingly.  Patient notes numerous functional improvements like improved speed  ascending/descending stairs.     OBJECTIVE IMPAIRMENTS decreased balance, difficulty walking, decreased strength, dizziness, impaired flexibility, and pain.   ACTIVITY LIMITATIONS squatting, stairs, and  transfers  PARTICIPATION LIMITATIONS: cleaning, laundry, and community activity  PERSONAL FACTORS Time since onset of injury/illness/exacerbation and 3+ comorbidities: CVA with right sided weakness, Seizures, HTN  are also affecting patient's functional outcome.   REHAB POTENTIAL: Good  CLINICAL DECISION MAKING: Evolving/moderate complexity  EVALUATION COMPLEXITY: Moderate   GOALS: Goals reviewed with patient? Yes  SHORT TERM GOALS: Target date: 06/20/2022  Pt will be independent with initial HEP. Baseline: Goal status: goal met 8/25  2.  Pt will improve TUG time to less than 14 seconds to place her at a low risk of falling. Baseline: 15.3 sec Goal status: MET 07/25/2022   LONG TERM GOALS: Target date: 09/15/2022   Pt will be independent with advanced HEP. Baseline:  Goal status: IN PROGRESS  2.  Pt will increase LEFS to at least 50% to demonstrate improvements in functional tasks. Baseline:  Goal status: IN PROGRESS (see above)  3.  Pt will increase BLE strength to at least 4+/5 to allow her to negotiate stairs with improved ease. Baseline:  Goal status: IN PROGRESS (see above)  4.  Pt will be able to ambulate for at least 15-20 minutes with LRAD and without reports of increased pain. Baseline:  Goal status: MET on 08/31/2022  5.  Pt will report at least an 80% improvement in dizziness symptoms and deny dizziness when lying in bed and rolling over. Baseline:  on 07/25/22, pt reports some dizziness in bed Goal status:  MET on 08/31/2022  6.  Pt will increase bilateral shoulder strength to at least 4+/5 to allow her to perform functional tasks with improved ease.  Baseline:  on 08/01/22 measured at 4/5 bilat  Goal status:  in progress  7.  Pt will increase right  shoulder A/ROM for flexion and abduction to at least 140 degrees to allow her to reach into cabinets.  Baseline:  On 08/01/2022 measured right flexion 120 deg, right abduction 115 deg  Goal status:  goal met 10/31    PLAN: PT FREQUENCY: 1-2x/week  PT DURATION: 8 weeks  PLANNED INTERVENTIONS: Therapeutic exercises, Therapeutic activity, Neuromuscular re-education, Balance training, Gait training, Patient/Family education, Self Care, Joint mobilization, Joint manipulation, Stair training, Vestibular training, Canalith repositioning, Aquatic Therapy, Dry Needling, Electrical stimulation, Cryotherapy, Moist heat, Taping, Ultrasound, Ionotophoresis 48m/ml Dexamethasone, Manual therapy, and Re-evaluation  PLAN FOR NEXT SESSION:  lat bar; 4# with LAQ;  green band;  progress LE strengthening, flexibility, balance, Dix Hallpike and canalith repositioning as indicated.  SRuben Im PT 09/05/22 2:41 PM Phone: [phone]: [phone]  [id number] [place] [address]Phone # [phone] [phone]

## 2022-09-07 ENCOUNTER — Ambulatory Visit: Payer: Medicaid Other | Admitting: Rehabilitative and Restorative Service Providers"

## 2022-09-12 ENCOUNTER — Ambulatory Visit: Payer: Medicaid Other | Admitting: Physical Therapy

## 2022-09-12 DIAGNOSIS — G8929 Other chronic pain: Secondary | ICD-10-CM

## 2022-09-12 DIAGNOSIS — R2689 Other abnormalities of gait and mobility: Secondary | ICD-10-CM

## 2022-09-12 DIAGNOSIS — M25562 Pain in left knee: Secondary | ICD-10-CM | POA: Diagnosis not present

## 2022-09-12 DIAGNOSIS — M6281 Muscle weakness (generalized): Secondary | ICD-10-CM

## 2022-09-12 NOTE — Therapy (Signed)
OUTPATIENT PHYSICAL THERAPY TREATMENT NOTE   Patient Name: Alison Ward MRN: [id number] DOB:Nov 29, 1960, 61 y.o., female Today's Date: 09/05/2022      PT End of Session - 09/05/22 1357     Visit Number 12    Date for PT Re-Evaluation 09/15/22    Authorization Type [employer]    Authorization - Visit Number 12    Authorization - Number of Visits 27    PT Start Time 1400    PT Stop Time 1440    PT Time Calculation (min) 40 min    Activity Tolerance Patient tolerated treatment well             Past Medical History:  Diagnosis Date   Chronic headaches    daily, bitemporal, associated with tingling in face   Heart murmur    Hypertension    Seizures ([place])    partial seizures last one on 10/22/15  On Keppra currently - last seizure 08-2018 x 2 small issues    Stroke ([place]) 07/05/2013   Past Surgical History:  Procedure Laterality Date   ABDOMINAL HYSTERECTOMY N/A 12/15/2015   Procedure: HYSTERECTOMY ABDOMINAL;  Surgeon: Emily Filbert, MD;  Location: [facility];  Service: Gynecology;  Laterality: N/A;   CESAREAN SECTION     ceserean section     COLONOSCOPY     POLYPECTOMY     SALPINGOOPHORECTOMY Bilateral 12/15/2015   Procedure: SALPINGO OOPHORECTOMY;  Surgeon: Emily Filbert, MD;  Location: [facility];  Service: Gynecology;  Laterality: Bilateral;   VENTRICULOSTOMY Left 07/07/2013   Procedure: VENTRICULOSTOMY;  Surgeon: Ophelia Charter, MD;  Location: [facility];  Service: Neurosurgery;  Laterality: Left;  Left Ventriculostomy and Removal of Right Vetriculostomy   Patient Active Problem List   Diagnosis Date Noted   Primary osteoarthritis of left knee 05/19/2022   Lumbar radiculopathy 03/11/2018   Ganglion cyst of right foot 07/04/2017   Chronic tension-type headache, not intractable 12/30/2015   Post-operative state 12/15/2015   Simple partial seizure disorder ([place]) 11/04/2015   Left sided numbness 09/25/2015   Leukopenia 09/25/2015   Tension vascular headache  09/07/2015   Meralgia paresthetica of left side 09/07/2015   Fainting spell 09/07/2015   Prediabetes 09/02/2015   Stye 08/06/2014   Essential hypertension, benign 04/16/2014   CVA (cerebral vascular accident) ([place]) 04/16/2014   Headache 04/16/2014   Tension headache 03/06/2014   Obesity, unspecified 08/13/2013   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   ICH (intracerebral hemorrhage) ([place]) 07/21/2013   Hypokalemia 07/21/2013   Intracerebral hemorrhage ([place]) 07/06/2013   Essential hypertension 02/24/2009    PCP: Charlott Rakes, MD   REFERRING PROVIDER: Charlott Rakes, MD   REFERRING DIAG: [phone] (ICD-10-CM) - Chronic pain of left knee M79.651,M79.652 (ICD-10-CM) - Pain in both thighs  M25.511,G89.29 (ICD-10-CM) - Chronic right shoulder pain  THERAPY DIAG:  Chronic pain of left knee  Other abnormalities of gait and mobility  Muscle weakness (generalized)  Chronic right shoulder pain  Rationale for Evaluation and Treatment Rehabilitation  ONSET DATE: 05/15/2022 order received.  Pt reports intermittent pain since March 2023.  SUBJECTIVE:   SUBJECTIVE STATEMENT:  My shoulder has been hurting me b/c of the cold weather.   PERTINENT HISTORY: CVA with right sided weakness in 2017, Dizziness, HTN  PAIN:  right shoulder pain  Are you having pain? Yes: NPRS scale: 0/10 Pain location: right hip pain Pain description: aching and sharp Aggravating factors: sit to stand transfer Relieving factors: rest in certain positions  PRECAUTIONS: None  WEIGHT BEARING RESTRICTIONS No  FALLS:  Has patient fallen in last 6 months? 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   OBJECTIVE:   DIAGNOSTIC FINDINGS:  Left knee radiograph on 05/15/22:   IMPRESSION: Severe lateral and patellofemoral compartment osteoarthritis.  Right shoulder radiograph on 07/27/2022: IMPRESSION: Advanced degenerative arthritis at the [facility] joint with subacromial spurring.  PATIENT SURVEYS:  05/30/2022:  LEFS 22 / 80 = 27.5 % 07/25/2022:  Lower Extremity Functional Score: 26 / 80 = 32.5 % 10/31:  LEFS  32/80 40%   COGNITION:  Overall cognitive status: Within functional limits for tasks assessed     SENSATION: WFL  MUSCLE LENGTH: Tight hamstrings bilaterally with left more impaired than right  PALPATION: Tenderness to palpation over left knee  LOWER EXTREMITY ROM:  05/30/2022: Right knee 0 to 115 degrees Left knee 5 to 110 degrees  07/25/2022: Right knee 0 to 117 degrees Left knee 3 to 112 degrees  10/19: Right knee 0 to 118 Left knee 0 to 115  10/31:  Right knee 0 to 118 Left knee 0 to 123    10/31:   UPPER EXTREMITY STRENGTH/ROM:  08/01/2022: Right shoulder flexion 120 deg, Right shoulder abduction 115 degrees Left shoulder flexion 142 deg, Left shoulder abduction 135 deg Bilateral shoulder strength of 4/5 grossly throughout with pain Grip strength:  Right 39 lbs, Left 36 lbs  10/31:  right shoulder flexion 145  deg, right shoulder abduction   146 degrees Left shoulder flexion 148 deg, Left shoulder abduction 161 deg Bilateral shoulder strength of 4/5 grossly throughout with pain  LOWER EXTREMITY MMT:  05/30/2022: Right hip 4/5, Right knee 5/5 Left hip 4-/5, left knee 4/5  07/25/2022: Right hip 4 to 4+/5, Right knee 5/5 Left hip 4/5, left knee 4+/5  10/31:  Right hip 4 to 4+/5, Right knee 5/5 Left hip 4/5, left knee 4+/5  LOWER EXTREMITY SPECIAL TESTS:  05/30/2022:  Knee special tests: Anterior drawer test: negative  and Posterior drawer test: negative  FUNCTIONAL TESTS:   05/30/2022 5 times sit to stand: 29.7 sec with use of BUE Timed up and go (TUG): 15.3 sec without assistive device, with some unsteadiness noted with 180 degree turn  07/25/2022: 5 times sit to stand: 19.6 sec without use of BUE Timed up and go (TUG): 12.9 sec without assistive device 3 min walk test:  523 ft  10/17: 3 min walk test: 530 feet  10/19:  5x sit to stand 15.13 sec without use of BUE   10/31:   TUG 11.15 no loss of balance  5x sit to stand  16.8 no UE assist    GAIT: Distance walked: 100 ft Assistive device utilized: None Level of assistance: Modified independence Comments: Pt with antalgic gait pattern, pt recommend that she may benefit from use of SPC  VESTIBULAR EVALUATION: 08/01/2022: Pt reports that she sleeps on multiple pillows secondary to having vertigo when she lies down in bed, particularly on her right side Dix Hallpike positive on right side    TODAY'S TREATMENT: 09/12/22: Nustep level 3 x10 min with PT present to discuss  status Light green loop steering wheel ex 10x Light green loop bil external rotation 10x 2 (mild discomfort) Sit to/from stand holding 5# kettlebell:  x10 with overhead press FWD and lateral 6" step ups with UE support x10 bilat each Standing lat pull 30# 2x10 Seated on foam cushion with 7# on thigh with LE over small hurdle and back 2x10 bilat (no cuing for improved hip flexion instead of LAQ) Standing low row blue band 10x2 Forward cable walk 15# 6x with belt Backward cable walk 15# 8x with belt  Discussion of using whole body to shut car door       09/05/22: Nustep level 5 x10 min with PT present to discuss status Sit to/from stand holding 5# kettlebell:  x10 with chest press, x10 with overhead press FWD and lateral 6" step ups with UE support x10 bilat each Standing lat pull 30# 2x10 Seated on foam cushion with 7# on thigh with LE over small hurdle and back  2x10 bilat (cuing for improved hip flexion instead of LAQ) Standing 3 way scapular stabilization on wall with green loop 2x5 bilat Forward cable walk 10# 10x with belt Backward cable walk 15# 8x with belt     PATIENT EDUCATION:  Education details: Issued HEP Person educated: Patient Education method: Consulting civil engineer, Media planner, and Handouts Education comprehension: verbalized understanding and returned demonstration   HOME EXERCISE PROGRAM: Access Code: [email or web address] URL: [email or web address] Date: 08/03/2022 Prepared by: Juel Burrow  Exercises - Seated Heel Toe Raises  - 1 x daily - 7 x weekly - 2 sets - 10 reps - Seated March  - 1 x daily - 7 x weekly - 2 sets - 10 reps - Seated Long Arc Quad  - 1 x daily - 7 x weekly - 2 sets - 10 reps - Seated Hip Adduction Isometrics with Ball  - 1 x daily - 7 x weekly - 2 sets - 10 reps - Supine Quad Set  - 1 x daily - 7 x weekly - 1 sets - 10 reps - Supine Heel Slide  - 1 x daily - 7 x weekly - 1 sets - 10 reps - Active Straight Leg Raise with Quad Set  - 1 x daily - 7 x weekly - 1 sets - 10 reps - Sidelying Hip Abduction (Mirrored)  - 1 x daily - 7 x weekly - 1 sets - 10 reps - [place]  - 1 x daily - 7 x weekly - 3 sets - 10 reps - Retro Step  - 1 x daily - 7 x weekly - 3 sets - 10 reps - Shoulder External Rotation and Scapular Retraction with Resistance  - 1 x daily - 7 x weekly - 2 sets - 10 reps - Standing Shoulder Horizontal Abduction with Resistance  - 1 x daily - 7 x weekly - 2 sets - 10 reps - Shoulder Flexion Wall Slide with Towel  - 1 x daily - 7 x weekly - 2 sets - 10 reps - Standing Shoulder Abduction Slides at Wall  - 1 x daily - 7 x weekly - 2 sets - 10 reps   ASSESSMENT:  CLINICAL IMPRESSION: Despite increased shoulder pain which she attributes to the cold weather, she is able to perform progressive strengthening ex's for the shoulder.  She is able to continue with LE strengthening as well emphasizing quad  and glute strengthening.  Therapist monitoring response and modifying treatment based on pain.  Anticipate discharge next visit.  Recheck objective measures  and will finalize HEP.     OBJECTIVE IMPAIRMENTS decreased balance, difficulty walking, decreased strength, dizziness, impaired flexibility, and pain.   ACTIVITY LIMITATIONS squatting, stairs, and transfers  PARTICIPATION LIMITATIONS: cleaning, laundry, and community activity  PERSONAL FACTORS Time since onset of injury/illness/exacerbation and 3+ comorbidities: CVA with right sided weakness, Seizures, HTN  are also affecting patient's functional outcome.   REHAB POTENTIAL: Good  CLINICAL DECISION MAKING: Evolving/moderate complexity  EVALUATION COMPLEXITY: Moderate   GOALS: Goals reviewed with patient? Yes  SHORT TERM GOALS: Target date: 06/20/2022  Pt will be independent with initial HEP. Baseline: Goal status: goal met 8/25  2.  Pt will improve TUG time to less than 14 seconds to place her at a low risk of falling. Baseline: 15.3 sec Goal status: MET 07/25/2022   LONG TERM GOALS: Target date: 09/15/2022   Pt will be independent with advanced HEP. Baseline:  Goal status: IN PROGRESS  2.  Pt will increase LEFS to at least 50% to demonstrate improvements in functional tasks. Baseline:  Goal status: IN PROGRESS (see above)  3.  Pt will increase BLE strength to at least 4+/5 to allow her to negotiate stairs with improved ease. Baseline:  Goal status: IN PROGRESS (see above)  4.  Pt will be able to ambulate for at least 15-20 minutes with LRAD and without reports of increased pain. Baseline:  Goal status: MET on 08/31/2022  5.  Pt will report at least an 80% improvement in dizziness symptoms and deny dizziness when lying in bed and rolling over. Baseline:  on 07/25/22, pt reports some dizziness in bed Goal status:  MET on 08/31/2022  6.  Pt will increase bilateral shoulder strength to at least 4+/5 to allow her to  perform functional tasks with improved ease.  Baseline:  on 08/01/22 measured at 4/5 bilat  Goal status:  in progress  7.  Pt will increase right shoulder A/ROM for flexion and abduction to at least 140 degrees to allow her to reach into cabinets.  Baseline:  On 08/01/2022 measured right flexion 120 deg, right abduction 115 deg  Goal status:  goal met 10/31    PLAN: PT FREQUENCY: 1-2x/week  PT DURATION: 8 weeks  PLANNED INTERVENTIONS: Therapeutic exercises, Therapeutic activity, Neuromuscular re-education, Balance training, Gait training, Patient/Family education, Self Care, Joint mobilization, Joint manipulation, Stair training, Vestibular training, Canalith repositioning, Aquatic Therapy, Dry Needling, Electrical stimulation, Cryotherapy, Moist heat, Taping, Ultrasound, Ionotophoresis 34m/ml Dexamethasone, Manual therapy, and Re-evaluation  PLAN FOR NEXT SESSION:  probable discharge next visit;  finalize HEP;  check remaining goals;  LEFS;  lat bar; 4# with LAQ;   progress LE strengthening, flexibility, balance, Dix Hallpike and canalith repositioning as indicated.  SRuben Im PT 09/12/22 2:41 PM Phone: [phone]: [phone] BTrustpoint [facility] Of Lubbock[address] [place] [id number] # [phone] [phone]

## 2022-09-14 ENCOUNTER — Ambulatory Visit: Payer: Medicaid Other | Admitting: Physical Therapy

## 2022-09-14 DIAGNOSIS — G8929 Other chronic pain: Secondary | ICD-10-CM

## 2022-09-14 DIAGNOSIS — M25562 Pain in left knee: Secondary | ICD-10-CM | POA: Diagnosis not present

## 2022-09-14 DIAGNOSIS — R2689 Other abnormalities of gait and mobility: Secondary | ICD-10-CM

## 2022-09-14 DIAGNOSIS — M6281 Muscle weakness (generalized): Secondary | ICD-10-CM

## 2022-09-14 NOTE — Therapy (Signed)
OUTPATIENT PHYSICAL THERAPY TREATMENT NOTE   Patient Name: Alison Ward MRN: [id number] DOB:[birth year], 61 y.o., female Today's Date: 09/14/2022      PT End of Session - 09/14/22 1403     Visit Number 14    Date for PT Re-Evaluation 09/15/22    Authorization - Number of Visits 27    PT Start Time 1400    PT Stop Time 9678    PT Time Calculation (min) 45 min    Activity Tolerance Patient tolerated treatment well             Past Medical History:  Diagnosis Date   Chronic headaches    daily, bitemporal, associated with tingling in face   Heart murmur    Hypertension    Seizures ([place])    partial seizures last one on 10/22/15  On Keppra currently - last seizure 08-2018 x 2 small issues    Stroke ([place]) 07/05/2013   Past Surgical History:  Procedure Laterality Date   ABDOMINAL HYSTERECTOMY N/A 12/15/2015   Procedure: HYSTERECTOMY ABDOMINAL;  Surgeon: Emily Filbert, MD;  Location: [facility];  Service: Gynecology;  Laterality: N/A;   CESAREAN SECTION     ceserean section     COLONOSCOPY     POLYPECTOMY     SALPINGOOPHORECTOMY Bilateral 12/15/2015   Procedure: SALPINGO OOPHORECTOMY;  Surgeon: Emily Filbert, MD;  Location: [facility];  Service: Gynecology;  Laterality: Bilateral;   VENTRICULOSTOMY Left 07/07/2013   Procedure: VENTRICULOSTOMY;  Surgeon: Ophelia Charter, MD;  Location: [facility];  Service: Neurosurgery;  Laterality: Left;  Left Ventriculostomy and Removal of Right Vetriculostomy   Patient Active Problem List   Diagnosis Date Noted   Primary osteoarthritis of left knee 05/19/2022   Lumbar radiculopathy 03/11/2018   Ganglion cyst of right foot 07/04/2017   Chronic tension-type headache, not intractable 12/30/2015   Post-operative state 12/15/2015   Simple partial seizure disorder ([place]) 11/04/2015   Left sided numbness 09/25/2015   Leukopenia 09/25/2015   Tension vascular headache 09/07/2015   Meralgia paresthetica of left side 09/07/2015   Fainting  spell 09/07/2015   Prediabetes 09/02/2015   Stye 08/06/2014   Essential hypertension, benign 04/16/2014   CVA (cerebral vascular accident) ([place]) 04/16/2014   Headache 04/16/2014   Tension headache 03/06/2014   Obesity, unspecified 08/13/2013   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[place]) 07/22/2013   ICH (intracerebral hemorrhage) ([place]) 07/21/2013   Hypokalemia 07/21/2013   Intracerebral hemorrhage ([place]) 07/06/2013   Essential hypertension 02/24/2009    PCP: Charlott Rakes, MD   REFERRING PROVIDER: Charlott Rakes, MD   REFERRING DIAG: [phone] (ICD-10-CM) - Chronic pain of left knee M79.651,M79.652 (ICD-10-CM) - Pain in both thighs  M25.511,G89.29 (ICD-10-CM) - Chronic right shoulder pain  THERAPY DIAG:  Chronic pain of left knee  Other abnormalities of gait and mobility  Chronic right shoulder pain  Muscle weakness (generalized)  Rationale for Evaluation and Treatment Rehabilitation  ONSET DATE: 05/15/2022 order received.  Pt reports intermittent pain since March 2023.  SUBJECTIVE:   SUBJECTIVE STATEMENT:  I got here early so I walked a bit.  Shoulder is up and down.  I think the Lucianne Lei door opening/closing bothers me.   I can lift grocery bags.  I can go up and down the stairs.     PERTINENT HISTORY: CVA with right sided weakness in 2017, Dizziness, HTN  PAIN:  right shoulder pain  Are you having pain? Yes: NPRS scale: 0/10 Pain location: right hip pain Pain description:  aching and sharp Aggravating factors: sit to stand transfer Relieving factors: rest in certain positions   PRECAUTIONS: None  WEIGHT BEARING RESTRICTIONS No  FALLS:  Has patient fallen in last 6 months? 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   OBJECTIVE:   DIAGNOSTIC FINDINGS:  Left knee radiograph on 05/15/22:   IMPRESSION: Severe lateral and patellofemoral compartment osteoarthritis.  Right shoulder radiograph on 07/27/2022: IMPRESSION: Advanced degenerative arthritis at the [facility] joint with subacromial spurring.  PATIENT SURVEYS:  05/30/2022:  LEFS 22 / 80 = 27.5 % 07/25/2022:  Lower Extremity Functional Score: 26 / 80 = 32.5 % 10/31:  LEFS  32/80 40%  11/16:  LEFS 72.5%   COGNITION:  Overall cognitive status: Within functional limits for tasks assessed     SENSATION: WFL  MUSCLE LENGTH: Tight hamstrings bilaterally with left more impaired than right  PALPATION: Tenderness to palpation over left knee  LOWER EXTREMITY ROM:  05/30/2022: Right knee 0 to 115 degrees Left knee 5 to 110 degrees  07/25/2022: Right knee 0 to 117 degrees Left knee 3 to 112 degrees  10/19: Right knee 0 to 118 Left knee 0 to 115  10/31:  Right knee 0 to 118 Left knee 0 to 123  11/16: Right knee 0 to 125 Left knee 0 to 123     10/31:   UPPER EXTREMITY STRENGTH/ROM:  08/01/2022: Right shoulder flexion 120 deg, Right shoulder abduction 115 degrees Left shoulder flexion 142 deg, Left shoulder abduction 135 deg Bilateral shoulder strength of 4/5 grossly throughout with pain Grip strength:  Right 39 lbs, Left 36 lbs  10/31:  right shoulder flexion 145  deg, right shoulder abduction   146 degrees Left shoulder flexion 148 deg, Left shoulder abduction 161 deg Bilateral shoulder strength of 4/5 grossly throughout with pain   11/16; right shoulder flexion 158  deg, right shoulder abduction   178 degrees Left shoulder flexion 150 deg, Left shoulder abduction 165 deg Bilateral shoulder strength of 4+/5 grossly no pain  LOWER EXTREMITY MMT:  05/30/2022: Right  hip 4/5, Right knee 5/5 Left hip 4-/5, left knee 4/5  07/25/2022: Right hip 4 to 4+/5, Right knee 5/5 Left hip 4/5, left knee 4+/5  10/31:  Right hip 4 to 4+/5, Right knee 5/5 Left hip 4/5, left knee 4+/5  11/16: Right hip 5-/5, Right knee 5/5 Left hip 5-, left knee 4+/5  LOWER EXTREMITY SPECIAL TESTS:  05/30/2022:  Knee special tests: Anterior drawer test: negative and Posterior drawer test: negative  FUNCTIONAL TESTS:   05/30/2022 5 times sit to stand: 29.7 sec with use of BUE Timed up and go (TUG): 15.3 sec without assistive device, with some unsteadiness noted with 180 degree turn  07/25/2022: 5 times sit to stand: 19.6 sec without use of BUE Timed up and go (TUG): 12.9 sec without assistive device 3 min walk test:  523 ft  10/17: 3 min walk test: 530 feet  10/19:  5x sit to stand 15.13 sec without use of BUE   10/31:   TUG 11.15 no loss of balance  5x sit to stand  16.8 no UE  assist   11/16: TUG   10.54 no loss of balance  5x sit to stand 14.83 no UE assist 3 min walk test 611    GAIT: Distance walked: 100 ft Assistive device utilized: None Level of assistance: Modified independence Comments: Pt with antalgic gait pattern, pt recommend that she may benefit from use of SPC  VESTIBULAR EVALUATION: 08/01/2022: Pt reports that she sleeps on multiple pillows secondary to having vertigo when she lies down in bed, particularly on her right side Dix Hallpike positive on right side    TODAY'S TREATMENT: 11/16: Nustep level 5 x5 min with PT present to discuss status FWD and lateral 6" step ups with UE support x10 bilat each LEFS 3 min walk TUG 5x sit to stand Standing lat pull 30# 2x10 Green band rows 2x10     09/12/22: Nustep level 3 x10 min with PT present to discuss status Light green loop steering wheel ex 10x Light green loop bil external rotation 10x 2 (mild discomfort) Sit to/from stand holding 5# kettlebell:  x10 with overhead press FWD and  lateral 6" step ups with UE support x10 bilat each Standing lat pull 30# 2x10 Seated on foam cushion with 7# on thigh with LE over small hurdle and back 2x10 bilat (no cuing for improved hip flexion instead of LAQ) Standing low row blue band 10x2 Forward cable walk 15# 6x with belt Backward cable walk 15# 8x with belt  Discussion of using whole body to shut car door       09/05/22: Nustep level 5 x10 min with PT present to discuss status Sit to/from stand holding 5# kettlebell:  x10 with chest press, x10 with overhead press FWD and lateral 6" step ups with UE support x10 bilat each Standing lat pull 30# 2x10 Seated on foam cushion with 7# on thigh with LE over small hurdle and back 2x10 bilat (cuing for improved hip flexion instead of LAQ) Standing 3 way scapular stabilization on wall with green loop 2x5 bilat Forward cable walk 10# 10x with belt Backward cable walk 15# 8x with belt     PATIENT EDUCATION:  Education details: Issued HEP Person educated: Patient Education method: Consulting civil engineer, Media planner, and Handouts Education comprehension: verbalized understanding and returned demonstration   HOME EXERCISE PROGRAM: Access Code: [email or web address] URL: [email or web address] Date: 08/03/2022 Prepared by: Juel Burrow  Exercises - Seated Heel Toe Raises  - 1 x daily - 7 x weekly - 2 sets - 10 reps - Seated March  - 1 x daily - 7 x weekly - 2 sets - 10 reps - Seated Long Arc Quad  - 1 x daily - 7 x weekly - 2 sets - 10 reps - Seated Hip Adduction Isometrics with Ball  - 1 x daily - 7 x weekly - 2 sets - 10 reps - Supine Quad Set  - 1 x daily - 7 x weekly - 1 sets - 10 reps - Supine Heel Slide  - 1 x daily - 7 x weekly - 1 sets - 10 reps - Active Straight Leg Raise with Quad Set  - 1 x daily - 7 x weekly - 1 sets - 10 reps - Sidelying Hip Abduction (Mirrored)  - 1 x daily - 7 x weekly - 1 sets - 10 reps - [place]  - 1 x daily - 7 x weekly - 3 sets - 10 reps -  Retro Step  - 1 x daily - 7 x weekly - 3 sets - 10  reps - Shoulder External Rotation and Scapular Retraction with Resistance  - 1 x daily - 7 x weekly - 2 sets - 10 reps - Standing Shoulder Horizontal Abduction with Resistance  - 1 x daily - 7 x weekly - 2 sets - 10 reps - Shoulder Flexion Wall Slide with Towel  - 1 x daily - 7 x weekly - 2 sets - 10 reps - Standing Shoulder Abduction Slides at Wall  - 1 x daily - 7 x weekly - 2 sets - 10 reps   ASSESSMENT:  CLINICAL IMPRESSION: The patient has met all rehab goals with excellent improvements in pain reduction, outcome score, ROM, strength and functional mobility.  Her 5x sit to stand, TUG and 3 min walk test times have dramatically improved since start of care (see above).  Her LEFS functional outcome score improved from 27% to 72%. Shoulder and knee ROM is WFLs bilaterally.    A comprehensive HEP has been established and anticipate further improvements over time with regular performance of the program.  Recommend discharge from PT at this time.    OBJECTIVE IMPAIRMENTS decreased balance, difficulty walking, decreased strength, dizziness, impaired flexibility, and pain.   ACTIVITY LIMITATIONS squatting, stairs, and transfers  PARTICIPATION LIMITATIONS: cleaning, laundry, and community activity  PERSONAL FACTORS Time since onset of injury/illness/exacerbation and 3+ comorbidities: CVA with right sided weakness, Seizures, HTN  are also affecting patient's functional outcome.   REHAB POTENTIAL: Good  CLINICAL DECISION MAKING: Evolving/moderate complexity  EVALUATION COMPLEXITY: Moderate   GOALS: Goals reviewed with patient? Yes  SHORT TERM GOALS: Target date: 06/20/2022  Pt will be independent with initial HEP. Baseline: Goal status: goal met 8/25  2.  Pt will improve TUG time to less than 14 seconds to place her at a low risk of falling. Baseline: 15.3 sec Goal status: MET 07/25/2022   LONG TERM GOALS: Target date: 09/15/2022    Pt will be independent with advanced HEP. Baseline:  Goal status: met 11/16  2.  Pt will increase LEFS to at least 50% to demonstrate improvements in functional tasks. Baseline:  Goal status: met 11/16 3.  Pt will increase BLE strength to at least 4+/5 to allow her to negotiate stairs with improved ease. Baseline:  Goal status: met 11/16  4.  Pt will be able to ambulate for at least 15-20 minutes with LRAD and without reports of increased pain. Baseline:  Goal status: MET on 08/31/2022  5.  Pt will report at least an 80% improvement in dizziness symptoms and deny dizziness when lying in bed and rolling over. Baseline:  on 07/25/22, pt reports some dizziness in bed Goal status:  MET on 08/31/2022  6.  Pt will increase bilateral shoulder strength to at least 4+/5 to allow her to perform functional tasks with improved ease.  Baseline:  on 08/01/22 measured at 4/5 bilat  Goal status:  met 11/2  7.  Pt will increase right shoulder A/ROM for flexion and abduction to at least 140 degrees to allow her to reach into cabinets.  Baseline:  On 08/01/2022 measured right flexion 120 deg, right abduction 115 deg  Goal status:  goal met 10/31    PLAN: PHYSICAL THERAPY DISCHARGE SUMMARY  Visits from Start of Care: 14  Current functional level related to goals / functional outcomes: See clinical impressions above   Remaining deficits: As above   Education / Equipment: HEP   Patient agrees to discharge. Patient goals were met. Patient is being discharged due to meeting  the stated rehab goals.    Ruben Im, PT 09/14/22 3:46 PM Phone: [phone] Fax: [phone]  [facility] [address] [place], [place] [address] Phone # [phone] Fax [phone]

## 2022-10-10 ENCOUNTER — Ambulatory Visit: Payer: Medicaid Other | Admitting: Family Medicine

## 2022-11-28 NOTE — Progress Notes (Signed)
[facility] [address] [place]. [place] [address] [phone]       OFFICE FOLLOW UP NOTE  [name] Date of Birth:  [birth year] Medical Record Number:  [id number]     CHIEF COMPLAINT:  Chief Complaint  Patient presents with   Follow-up    RM 3 alone Pt is well and stable, no new stroke or sz concerns        HPI:  Patient initially seen by Dr. Pearlean Brownie in 2015 for right parietal temporal ICH with surrounding edema and intraventricular extension s/p right ventriculostomy catheter. Post ICH left-sided paresthesias which improved but reported intermittent left leg numbness and over time started to affect her left arm and face. Also experiencing headaches ever since ICH. She reported recurrent episodes of near syncope since 2016 which resolved after starting Keppra.   Update 11/29/2022 JM: Patient returns for 39-month follow-up unaccompanied.  She has been doing well since prior visit. Denies any seizure activity, compliant on Keppra XR 500 mg daily. Denies any issues with headaches. Denies any stroke/TIA symptoms - continues on aspirin and atorvastatin. Blood pressure well controlled.  No questions or concerns today.    History provided for reference purposes only Update 05/23/2022 JM: Patient returns for follow-up after prior visit 4 months ago unaccompanied.  She was reporting transient episodes of left-sided paresthesias as well as left-sided headache as well as sensation of dizziness/imbalance.  Completed CT head which did not show any acute findings or new findings compared to prior imaging.  Recommended increasing Keppra XR dosage to 1000 mg daily but currently has only been taking 500 mg, will take additional 500mg  tablet if not feeling well but has not had to use recently.  She has not had any additional episodes left-sided paresthesias or dizziness/lightheadedness symptoms.  She will have occasional headaches with weather changes but  otherwise stable.  She continues to travel back and forth from here to [place] caring for her mother and plans on returning in August.  Recently seen by orthopedics Dr. Roda Shutters for left knee pain and considering pursuing left knee replacement as it significantly interferes with daily activity and functioning.  No further concerns at this time.   Update 01/16/2022 JM: 62 year old female with history of stroke, seizure disorder and chronic headaches.  She returns today for follow-up visit and medication management.  Prior visit almost 2 years ago as she has spent a prolonged period of time in the [place] visiting family. Over the past couple months, she has been caring for her ill mother in [place] and has started feeling left sided headaches and left sided paresthesias . Was not feeling these while in [place] which she believes is because she was resting and relaxing.  She also reports dizziness and imbalance like she may pass out but thankfully has not had any loss of consciousness.  Reports compliance on Keppra XR 500 mg daily and at times will take an extra dose of keppra if feeling like she may pass out. She will check BP which has been normal at those times. She has remained on gabapentin 600 mg 3 times daily with benefit of typical headaches. Her current headaches feel different than her normal headaches.  Otherwise, denies new stroke/TIA symptoms such as visual changes, weakness, speech difficulty or altered mental status        ROS:   14 system review of systems performed and negative with exception of those listed in HPI  PMH:  Past Medical History:  Diagnosis Date  Chronic headaches    daily, bitemporal, associated with tingling in face   Heart murmur    Hypertension    Seizures ([facility])    partial seizures last one on 10/22/15  On Keppra currently - last seizure 08-2018 x 2 small issues    Stroke ([facility]) 07/05/2013    PSH:  Past Surgical History:  Procedure Laterality Date    ABDOMINAL HYSTERECTOMY N/A 12/15/2015   Procedure: HYSTERECTOMY ABDOMINAL;  Surgeon: Allie Bossier, MD;  Location: [facility];  Service: Gynecology;  Laterality: N/A;   CESAREAN SECTION     ceserean section     COLONOSCOPY     POLYPECTOMY     SALPINGOOPHORECTOMY Bilateral 12/15/2015   Procedure: SALPINGO OOPHORECTOMY;  Surgeon: Allie Bossier, MD;  Location: [facility];  Service: Gynecology;  Laterality: Bilateral;   VENTRICULOSTOMY Left 07/07/2013   Procedure: VENTRICULOSTOMY;  Surgeon: Cristi Loron, MD;  Location: [facility];  Service: Neurosurgery;  Laterality: Left;  Left Ventriculostomy and Removal of Right Vetriculostomy    Social History:  Social History   Socioeconomic History   Marital status: Divorced    Spouse name: Not on file   Number of children: 3   Years of education: college   Highest education level: Not on file  Occupational History   Occupation: [employer]  Tobacco Use   Smoking status: Never   Smokeless tobacco: Never  Vaping Use   Vaping Use: Never used  Substance and Sexual Activity   Alcohol use: No   Drug use: No   Sexual activity: Not on file  Other Topics Concern   Not on file  Social History Narrative   Not on file   Social Determinants of Health   Financial Resource Strain: Not on file  Food Insecurity: Not on file  Transportation Needs: Not on file  Physical Activity: Not on file  Stress: Not on file  Social Connections: Not on file  Intimate Partner Violence: Not on file    Family History:  Family History  Problem Relation Age of Onset   Hypertension Mother    Diabetes Mother    Stroke Mother    Hypertension Father    Colon cancer Neg Hx    Breast cancer Neg Hx    Colon polyps Neg Hx    Esophageal cancer Neg Hx    Rectal cancer Neg Hx    Stomach cancer Neg Hx     Medications:   Current Outpatient Medications on File Prior to Visit  Medication Sig Dispense Refill   aspirin EC 81 MG tablet Take 1 tablet (81 mg total) by  mouth daily. 30 tablet 1   atorvastatin (LIPITOR) 40 MG tablet Take 1 tablet (40 mg total) by mouth daily. 90 tablet 0   gabapentin (NEURONTIN) 600 MG tablet Take 1 tablet (600 mg total) by mouth 3 (three) times daily. 270 tablet 1   levETIRAcetam (KEPPRA XR) 500 MG 24 hr tablet Take 1 tablet (500 mg total) by mouth at bedtime. 90 tablet 3   lisinopril-hydrochlorothiazide (ZESTORETIC) 20-25 MG tablet Take 1 tablet by mouth daily. 90 tablet 0   meclizine (ANTIVERT) 25 MG tablet Take 1 tablet (25 mg total) by mouth 3 (three) times daily as needed for dizziness. 30 tablet 0   No current facility-administered medications on file prior to visit.    Allergies:   Allergies  Allergen Reactions   Elavil [Amitriptyline] Other (See Comments)    DIZZINESS   Topiramate Er Other (See Comments)  Suicidal Thoughts- [place]    OBJECTIVE:  Today's Vitals   11/29/22 1036  BP: 119/71  Pulse: 75  Weight: 250 lb (113.4 kg)  Height: 5\' 8"  (1.727 m)      Body mass index is 38.01 kg/m.   Physical Exam Today's Vitals   11/29/22 1036  BP: 119/71  Pulse: 75  Weight: 250 lb (113.4 kg)  Height: 5\' 8"  (1.727 m)   Body mass index is 38.01 kg/m.  General: well developed, well nourished, very pleasant middle aged [place] female, seated, in no evident distress Head: head normocephalic and atraumatic.   Neck: supple with no carotid or supraclavicular bruits Cardiovascular: regular rate and rhythm, no murmurs Skin:  no rash/petichiae Vascular:  Normal pulses all extremities   Neurologic Exam Mental Status: Awake and fully alert. Normal speech and language. Oriented to place and time. Recent and remote memory intact. Attention span, concentration and fund of knowledge appropriate. Mood and affect appropriate.  Cranial Nerves: Pupils equal, briskly reactive to light. Extraocular movements full without nystagmus. Visual fields full to confrontation. Hearing intact. Facial sensation intact. Face,  tongue, palate moves normally and symmetrically.  Motor: Normal bulk and tone. Normal strength in all tested extremity muscles. Sensory.: intact to touch , pinprick , position and vibratory sensation.  Coordination: Rapid alternating movements normal in all extremities. Finger-to-nose and heel-to-shin performed accurately bilaterally. Gait and Station: Arises from chair without difficulty. Stance is normal. Gait demonstrates normal stride length and balance Reflexes: 1+ and symmetric. Toes downgoing.       ASSESSMENT: Tytana Singleterry is a 62 y.o. 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  Remote history of hypertensive right temporal intracerebral hemorrhage in 2014. Worsening/change in headaches and reoccurrence of left sided intermittent paresthesias and pre-syncope symptoms in the beginning of 2023 - CTH negative for acute findings, resolved without intervention (did not increase Keppra as advised). No additional issues since then.     PLAN:  -Continue Keppra XR 500 mg daily for seizure prophylaxis, refill provided -Continue aspirin 81 mg and atorvastatin 40 mg for secondary stroke prevention -Continue to follow with PCP for chronic disease management    Follow-up in 1 year or call earlier if needed     I spent 21 minutes of face-to-face and non-face-to-face time with patient.  This included previsit chart review, lab review, study review, order entry, electronic health record documentation, patient education and discussion regarding the above and answered all the questions to patient's satisfaction   Ihor Austin, [facility]  [facility] [address] [place], [place] [address]  Phone [phone] Fax [phone] Note: This  document was prepared with digital dictation and possible smart phrase technology. Any transcriptional errors that result from this process are unintentional.

## 2022-11-29 ENCOUNTER — Encounter: Payer: Self-pay | Admitting: Adult Health

## 2022-11-29 ENCOUNTER — Ambulatory Visit: Payer: Medicaid Other | Admitting: Adult Health

## 2022-11-29 VITALS — BP 119/71 | HR 75 | Ht 68.0 in | Wt 250.0 lb

## 2022-11-29 DIAGNOSIS — I619 Nontraumatic intracerebral hemorrhage, unspecified: Secondary | ICD-10-CM

## 2022-11-29 DIAGNOSIS — G40109 Localization-related (focal) (partial) symptomatic epilepsy and epileptic syndromes with simple partial seizures, not intractable, without status epilepticus: Secondary | ICD-10-CM | POA: Diagnosis not present

## 2022-11-29 MED ORDER — LEVETIRACETAM ER 500 MG PO TB24: 500.0000 mg | ORAL_TABLET | Freq: Every evening | ORAL | 3 refills | Status: DC

## 2022-11-29 NOTE — Patient Instructions (Addendum)
Continue Keppra XR 500 mg daily for seizure prevention  Please call with any seizure activity or any reoccurring headaches     Followup in the future with me in 1 year or call earlier if needed      Thank you for coming to see [place] at [facility]. I hope we have been able to provide you high quality care today.  You may receive a patient satisfaction survey over the next few weeks. We would appreciate your feedback and comments so that we may continue to improve ourselves and the health of our patients.

## 2022-12-23 ENCOUNTER — Other Ambulatory Visit: Payer: Self-pay | Admitting: Family Medicine

## 2022-12-23 DIAGNOSIS — I10 Essential (primary) hypertension: Secondary | ICD-10-CM

## 2022-12-23 DIAGNOSIS — E7800 Pure hypercholesterolemia, unspecified: Secondary | ICD-10-CM

## 2022-12-25 NOTE — Telephone Encounter (Signed)
Requested Prescriptions  Pending Prescriptions Disp Refills   lisinopril-hydrochlorothiazide (ZESTORETIC) 20-25 MG tablet [Pharmacy Med Name: LISINOPRIL-HCTZ 20-25 MG TAB] 90 tablet 0    Sig: TAKE 1 TABLET BY MOUTH EVERY DAY     Cardiovascular:  ACEI + Diuretic Combos Failed - 12/23/2022  1:15 AM      Failed - Cr in normal range and within 180 days    Creat  Date Value Ref Range Status  12/06/2016 1.02 0.50 - 1.05 mg/dL Final    Comment:      For patients > or = 64 years of age: The upper reference limit for Creatinine is approximately 13% higher for people identified as African-American.      Creatinine, Ser  Date Value Ref Range Status  08/01/2022 1.04 (H) 0.57 - 1.00 mg/dL Final         Passed - Na in normal range and within 180 days    Sodium  Date Value Ref Range Status  08/01/2022 141 134 - 144 mmol/L Final         Passed - K in normal range and within 180 days    Potassium  Date Value Ref Range Status  08/01/2022 4.1 3.5 - 5.2 mmol/L Final         Passed - eGFR is 30 or above and within 180 days    GFR, Est African American  Date Value Ref Range Status  12/06/2016 70 >=60 mL/min Final   GFR calc Af Amer  Date Value Ref Range Status  01/20/2020 74 >59 mL/min/1.73 Final   GFR, Est Non African American  Date Value Ref Range Status  12/06/2016 61 >=60 mL/min Final   GFR calc non Af Amer  Date Value Ref Range Status  01/20/2020 64 >59 mL/min/1.73 Final   eGFR  Date Value Ref Range Status  08/01/2022 60 >59 mL/min/1.73 Final         Passed - Patient is not pregnant      Passed - Last BP in normal range    BP Readings from Last 1 Encounters:  11/29/22 119/71         Passed - Valid encounter within last 6 months    Recent Outpatient Visits           5 months ago Chronic right shoulder pain   [place], Enobong, MD   7 months ago Encounter for screening mammogram for malignant neoplasm of breast   [place], Enobong, MD   11 months ago Prediabetes   [place] Charlott Rakes, MD   2 years ago Prediabetes   [place], Enobong, MD   3 years ago [place], Darrick Penna, MD       Future Appointments             In 1 month [place], Charlane Ferretti, MD [place]             atorvastatin (LIPITOR) 40 MG tablet [Pharmacy Med Name: ATORVASTATIN 40 MG TABLET] 90 tablet 2    Sig: TAKE 1 TABLET BY MOUTH EVERY DAY     Cardiovascular:  Antilipid - Statins Failed - 12/23/2022  1:15 AM      Failed - Lipid Panel in normal range within the last 12 months    Cholesterol, Total  Date Value Ref Range Status  08/01/2022 130 100 - 199 mg/dL Final   LDL Chol Calc (NIH)  Date Value Ref Range Status  08/01/2022 68 0 - 99 mg/dL Final   HDL  Date Value Ref Range Status  08/01/2022 40 >39 mg/dL Final   Triglycerides  Date Value Ref Range Status  08/01/2022 121 0 - 149 mg/dL Final         Passed - Patient is not pregnant      Passed - Valid encounter within last 12 months    Recent Outpatient Visits           5 months ago Chronic right shoulder pain   [place], Charlane Ferretti, MD   7 months ago Encounter for screening mammogram for malignant neoplasm of breast   [place], Enobong, MD   11 months ago Prediabetes   [place] Charlott Rakes, MD   2 years ago Prediabetes   [place], MD   3 years ago [place], Darrick Penna, MD       Future Appointments             In 1 month Charlott Rakes, MD [place]

## 2023-01-24 ENCOUNTER — Ambulatory Visit: Payer: Medicaid Other | Attending: Family Medicine | Admitting: Family Medicine

## 2023-01-24 ENCOUNTER — Telehealth: Payer: Self-pay | Admitting: Family Medicine

## 2023-01-24 ENCOUNTER — Encounter: Payer: Self-pay | Admitting: Family Medicine

## 2023-01-24 VITALS — BP 134/84 | HR 67 | Ht 68.0 in | Wt 251.8 lb

## 2023-01-24 DIAGNOSIS — R011 Cardiac murmur, unspecified: Secondary | ICD-10-CM | POA: Diagnosis not present

## 2023-01-24 DIAGNOSIS — G5712 Meralgia paresthetica, left lower limb: Secondary | ICD-10-CM

## 2023-01-24 DIAGNOSIS — E7800 Pure hypercholesterolemia, unspecified: Secondary | ICD-10-CM

## 2023-01-24 DIAGNOSIS — R7303 Prediabetes: Secondary | ICD-10-CM

## 2023-01-24 DIAGNOSIS — I10 Essential (primary) hypertension: Secondary | ICD-10-CM | POA: Diagnosis not present

## 2023-01-24 MED ORDER — GABAPENTIN 600 MG PO TABS: 600.0000 mg | ORAL_TABLET | Freq: Three times a day (TID) | ORAL | 1 refills | Status: DC

## 2023-01-24 NOTE — Telephone Encounter (Signed)
Can you please schedule echocardiogram for this patient?  Thank you.

## 2023-01-24 NOTE — Addendum Note (Signed)
Addended byCharlott Rakes on: 01/24/2023 01:03 PM   Modules accepted: Orders

## 2023-01-24 NOTE — Progress Notes (Addendum)
Subjective:  Patient ID: Alison Ward, female    DOB: [birth year]  Age: 62 y.o. MRN: [id number]  CC: Hypertension   HPI Daniel Desjardin is a 62 y.o. year old female with a history of hypertension, chronic headaches, previous history of right  Hemorrhagic stroke status post ventriculostomy in 06/2013, Prediabetes (A1c 5.9), meralgia paresthetica, hyperlipidemia.  Interval History:  She underwent vestibular rehab and her vertigo is controlled, she no longer needs meclizine. Her Physical Therapy did help with pain in her legs, knees, back and shoulders are better.  She has been continuing to do the exercises at home. Endorses adherence with her antihypertensive and is doing well on her statin.  She is also on gabapentin for meralgia paresthetica and is tolerating it okay. Denies presence of additional concerns today. 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  On Keppra currently - last seizure 08-2018 x 2 small issues    Stroke ([place]) 07/05/2013    Past Surgical History:  Procedure Laterality Date   ABDOMINAL HYSTERECTOMY N/A 12/15/2015   Procedure: HYSTERECTOMY ABDOMINAL;  Surgeon: Emily Filbert, MD;  Location: [facility];  Service: Gynecology;  Laterality: N/A;   CESAREAN SECTION     ceserean section     COLONOSCOPY     POLYPECTOMY     SALPINGOOPHORECTOMY Bilateral 12/15/2015   Procedure: SALPINGO OOPHORECTOMY;  Surgeon: Emily Filbert, MD;  Location: [facility];  Service: Gynecology;  Laterality: Bilateral;   VENTRICULOSTOMY Left 07/07/2013   Procedure: VENTRICULOSTOMY;  Surgeon: Ophelia Charter, MD;  Location: [facility];  Service: Neurosurgery;  Laterality: Left;  Left Ventriculostomy and Removal of Right Vetriculostomy    Family History  Problem Relation Age of Onset   Hypertension Mother    Diabetes Mother     Stroke Mother    Hypertension Father    Colon cancer Neg Hx    Breast cancer Neg Hx    Colon polyps Neg Hx    Esophageal cancer Neg Hx    Rectal cancer Neg Hx    Stomach cancer Neg Hx     Social History   Socioeconomic History   Marital status: Divorced    Spouse name: Not on file   Number of children: 3   Years of education: college   Highest education level: Not on file  Occupational History   Occupation: [employer]  Tobacco Use   Smoking status: Never   Smokeless tobacco: Never  Vaping Use   Vaping Use: Never used  Substance and Sexual Activity   Alcohol use: No   Drug use: No   Sexual activity: Not on file  Other Topics Concern   Not on file  Social History Narrative   Not on file   Social Determinants of Health   Financial Resource Strain: Not on file  Food Insecurity: Not on file  Transportation Needs: Not on file  Physical Activity: Not on file  Stress: Not on file  Social Connections: Not on file    Allergies  Allergen Reactions   Elavil [Amitriptyline] Other (See Comments)    DIZZINESS   Topiramate Er Other (See Comments)    Suicidal Thoughts- [place]    Outpatient Medications Prior to Visit  Medication Sig Dispense Refill   aspirin EC 81 MG tablet Take 1  tablet (81 mg total) by mouth daily. 30 tablet 1   atorvastatin (LIPITOR) 40 MG tablet TAKE 1 TABLET BY MOUTH EVERY DAY 90 tablet 2   levETIRAcetam (KEPPRA XR) 500 MG 24 hr tablet Take 1 tablet (500 mg total) by mouth at bedtime. 90 tablet 3   lisinopril-hydrochlorothiazide (ZESTORETIC) 20-25 MG tablet TAKE 1 TABLET BY MOUTH EVERY DAY 90 tablet 0   gabapentin (NEURONTIN) 600 MG tablet Take 1 tablet (600 mg total) by mouth 3 (three) times daily. 270 tablet 1   meclizine (ANTIVERT) 25 MG tablet Take 1 tablet (25 mg total) by mouth 3 (three) times daily as needed for dizziness. (Patient not taking: Reported on 01/24/2023) 30 tablet 0   No facility-administered medications prior to  visit.     ROS Review of Systems  Constitutional:  Negative for activity change and appetite change.  HENT:  Negative for sinus pressure and sore throat.   Respiratory:  Negative for chest tightness, shortness of breath and wheezing.   Cardiovascular:  Negative for chest pain and palpitations.  Gastrointestinal:  Negative for abdominal distention, abdominal pain and constipation.  Genitourinary: Negative.   Musculoskeletal: Negative.   Psychiatric/Behavioral:  Negative for behavioral problems and dysphoric mood.     Objective:  BP 134/84   Pulse 67   Ht 5\' 8"  (1.727 m)   Wt 251 lb 12.8 oz (114.2 kg)   LMP 11/24/2011   SpO2 99%   BMI 38.29 kg/m      01/24/2023   12:33 PM 01/24/2023   11:38 AM 11/29/2022   10:36 AM  BP/Weight  Systolic BP [id number]  Diastolic BP 84 90 71  Wt. (Lbs)  251.8 250  BMI  38.29 kg/m2 38.01 kg/m2      Physical Exam Constitutional:      Appearance: She is well-developed.  Cardiovascular:     Rate and Rhythm: Normal rate.     Heart sounds: Murmur heard.  Pulmonary:     Effort: Pulmonary effort is normal.     Breath sounds: Normal breath sounds. No wheezing or rales.  Chest:     Chest wall: No tenderness.  Abdominal:     General: Bowel sounds are normal. There is no distension.     Palpations: Abdomen is soft. There is no mass.     Tenderness: There is no abdominal tenderness.  Musculoskeletal:        General: Normal range of motion.     Right lower leg: No edema.     Left lower leg: No edema.  Neurological:     Mental Status: She is alert and oriented to person, place, and time.  Psychiatric:        Mood and Affect: Mood normal.        Latest Ref Rng & Units 08/01/2022   10:16 AM 01/02/2022    4:45 PM 01/20/2020    8:41 AM  CMP  Glucose 70 - 99 mg/dL 83  85  84   BUN 8 - 27 mg/dL 14  16  11    Creatinine 0.57 - 1.00 mg/dL 1.04  0.87  0.96   Sodium 134 - 144 mmol/L 141  148  144   Potassium 3.5 - 5.2 mmol/L 4.1  4.3  3.8    Chloride 96 - 106 mmol/L 99  105  101   CO2 20 - 29 mmol/L 29  26  26    Calcium 8.7 - 10.3 mg/dL 9.8  9.7  9.5   Total Protein  6.0 - 8.5 g/dL 7.3  7.3  7.1   Total Bilirubin 0.0 - 1.2 mg/dL 0.8  0.9  1.0   Alkaline Phos 44 - 121 IU/L 63  67  66   AST 0 - 40 IU/L 16  22  23    ALT 0 - 32 IU/L 11  12  15      Lipid Panel     Component Value Date/Time   CHOL 130 08/01/2022 1016   TRIG 121 08/01/2022 1016   HDL 40 08/01/2022 1016   CHOLHDL 3.7 01/20/2020 0841   CHOLHDL 6.4 (H) 12/06/2016 1012   VLDL 35 (H) 12/06/2016 1012   LDLCALC 68 08/01/2022 1016    CBC    Component Value Date/Time   WBC 4.0 12/06/2016 1012   RBC 4.59 12/06/2016 1012   HGB 12.8 12/06/2016 1012   HCT 39.7 12/06/2016 1012   PLT 267 12/06/2016 1012   MCV 86.5 12/06/2016 1012   MCH 27.9 12/06/2016 1012   MCHC 32.2 12/06/2016 1012   RDW 16.1 (H) 12/06/2016 1012   LYMPHSABS 1,600 12/06/2016 1012   MONOABS 400 12/06/2016 1012   EOSABS 80 12/06/2016 1012   BASOSABS 40 12/06/2016 1012    Lab Results  Component Value Date   HGBA1C 5.8 01/02/2022    Assessment & Plan:  1. Meralgia paresthetica of left side Controlled - gabapentin (NEURONTIN) 600 MG tablet; Take 1 tablet (600 mg total) by mouth 3 (three) times daily.  Dispense: 270 tablet; Refill: 1  2. Essential hypertension, benign Controlled Continue lisinopril/HCTZ Counseled on blood pressure goal of less than 130/80, low-sodium, DASH diet, medication compliance, 150 minutes of moderate intensity exercise per week. Discussed medication compliance, adverse effects. - CMP14+EGFR; Future - CBC with Differential/Platelet; Future  3. Pure hypercholesterolemia Controlled Low-cholesterol diet Continue atorvastatin - LP+Non-HDL Cholesterol; Future  4. Prediabetes Labs reveal prediabetes with an A1c of 5.8.  Working on a low carbohydrate diet, exercise, weight loss is recommended in order to prevent progression to type 2 diabetes mellitus.  -  Hemoglobin A1c; Future  5.  Murmur Patient states this is longstanding Will order echocardiogram to follow-up on this as her last echo was in 2014 in which EF revealed LVEF 65 to 70% grade 1 DD, mildly calcified aortic valve leaflets but no aortic stenosis or regurg, trivial MVR   Meds ordered this encounter  Medications   gabapentin (NEURONTIN) 600 MG tablet    Sig: Take 1 tablet (600 mg total) by mouth 3 (three) times daily.    Dispense:  270 tablet    Refill:  1    Follow-up: Return in about 1 week (around 01/31/2023) for nurse visit shingrix; 6 months PCP HTN, 2nd dose shingrix.       Charlott Rakes, MD, FAAFP. [facility], [place]   01/24/2023, 12:33 PM

## 2023-01-25 NOTE — Telephone Encounter (Signed)
Pt has been informed of appointment details.

## 2023-02-01 ENCOUNTER — Ambulatory Visit: Payer: Medicaid Other | Attending: Family Medicine

## 2023-02-01 DIAGNOSIS — Z23 Encounter for immunization: Secondary | ICD-10-CM | POA: Diagnosis not present

## 2023-02-01 NOTE — Progress Notes (Signed)
Shingrix vaccine administered per protocols.  In the left deltoid Information sheet given. Patient denies and pain or discomfort at injection site. Tolerated injection well no reaction.

## 2023-02-14 ENCOUNTER — Ambulatory Visit (HOSPITAL_COMMUNITY): Payer: Medicaid Other

## 2023-03-08 ENCOUNTER — Other Ambulatory Visit: Payer: Self-pay | Admitting: Family Medicine

## 2023-03-08 DIAGNOSIS — I10 Essential (primary) hypertension: Secondary | ICD-10-CM

## 2023-03-08 NOTE — Telephone Encounter (Signed)
Requested medication (s) are due for refill today:   Yes  Requested medication (s) are on the active medication list:   Yes  Future visit scheduled:   Yes 07/30/2023 with Dr. Alvis Lemmings   Last ordered: 12/25/2022 #90, 0 refills  Returned because unable to refill per protocol because labs are due.   Requested Prescriptions  Pending Prescriptions Disp Refills   lisinopril-hydrochlorothiazide (ZESTORETIC) 20-25 MG tablet [Pharmacy Med Name: LISINOPRIL-HCTZ 20-25 MG TAB] 90 tablet 0    Sig: TAKE 1 TABLET BY MOUTH EVERY DAY     Cardiovascular:  ACEI + Diuretic Combos Failed - 03/08/2023 10:28 AM      Failed - Na in normal range and within 180 days    Sodium  Date Value Ref Range Status  08/01/2022 141 134 - 144 mmol/L Final         Failed - K in normal range and within 180 days    Potassium  Date Value Ref Range Status  08/01/2022 4.1 3.5 - 5.2 mmol/L Final         Failed - Cr in normal range and within 180 days    Creat  Date Value Ref Range Status  12/06/2016 1.02 0.50 - 1.05 mg/dL Final    Comment:      For patients > or = 64 years of age: The upper reference limit for Creatinine is approximately 13% higher for people identified as African-American.      Creatinine, Ser  Date Value Ref Range Status  08/01/2022 1.04 (H) 0.57 - 1.00 mg/dL Final         Failed - eGFR is 30 or above and within 180 days    GFR, Est African American  Date Value Ref Range Status  12/06/2016 70 >=60 mL/min Final   GFR calc Af Amer  Date Value Ref Range Status  01/20/2020 74 >59 mL/min/1.73 Final   GFR, Est Non African American  Date Value Ref Range Status  12/06/2016 61 >=60 mL/min Final   GFR calc non Af Amer  Date Value Ref Range Status  01/20/2020 64 >59 mL/min/1.73 Final   eGFR  Date Value Ref Range Status  08/01/2022 60 >59 mL/min/1.73 Final         Passed - Patient is not pregnant      Passed - Last BP in normal range    BP Readings from Last 1 Encounters:  01/24/23 134/84          Passed - Valid encounter within last 6 months    Recent Outpatient Visits           1 month ago Essential hypertension, benign   [place] [facility] [place], Odette Horns, MD   7 months ago Chronic right shoulder pain   [facility] Hoy Register, MD   9 months ago Encounter for screening mammogram for malignant neoplasm of breast   [facility] Hoy Register, MD   1 year ago Prediabetes   [place] [facility] Hoy Register, MD   3 years ago Prediabetes   [place] [facility] Hoy Register, MD       Future Appointments             In 4 months Hoy Register, MD [facility]

## 2023-03-13 ENCOUNTER — Ambulatory Visit: Payer: Medicaid Other | Attending: Family Medicine

## 2023-03-13 DIAGNOSIS — R011 Cardiac murmur, unspecified: Secondary | ICD-10-CM

## 2023-03-13 DIAGNOSIS — I10 Essential (primary) hypertension: Secondary | ICD-10-CM

## 2023-03-13 DIAGNOSIS — E7800 Pure hypercholesterolemia, unspecified: Secondary | ICD-10-CM

## 2023-03-13 DIAGNOSIS — R7303 Prediabetes: Secondary | ICD-10-CM

## 2023-03-14 ENCOUNTER — Other Ambulatory Visit: Payer: Self-pay | Admitting: Family Medicine

## 2023-03-14 LAB — CMP14+EGFR
ALT: 12 IU/L (ref 0–32)
AST: 17 IU/L (ref 0–40)
Albumin/Globulin Ratio: 1.3 (ref 1.2–2.2)
Albumin: 4 g/dL (ref 3.9–4.9)
Alkaline Phosphatase: 59 IU/L (ref 44–121)
BUN/Creatinine Ratio: 11 — ABNORMAL LOW (ref 12–28)
BUN: 12 mg/dL (ref 8–27)
Bilirubin Total: 1.4 mg/dL — ABNORMAL HIGH (ref 0.0–1.2)
CO2: 27 mmol/L (ref 20–29)
Calcium: 9.8 mg/dL (ref 8.7–10.3)
Chloride: 103 mmol/L (ref 96–106)
Creatinine, Ser: 1.1 mg/dL — ABNORMAL HIGH (ref 0.57–1.00)
Globulin, Total: 3 g/dL (ref 1.5–4.5)
Glucose: 79 mg/dL (ref 70–99)
Potassium: 4.1 mmol/L (ref 3.5–5.2)
Sodium: 143 mmol/L (ref 134–144)
Total Protein: 7 g/dL (ref 6.0–8.5)
eGFR: 56 mL/min/{1.73_m2} — ABNORMAL LOW (ref >59)

## 2023-03-14 LAB — LP+NON-HDL CHOLESTEROL
Cholesterol, Total: 147 mg/dL (ref 100–199)
HDL: 43 mg/dL (ref >39)
LDL Chol Calc (NIH): 81 mg/dL (ref 0–99)
Total Non-HDL-Chol (LDL+VLDL): 104 mg/dL (ref 0–129)
Triglycerides: 131 mg/dL (ref 0–149)
VLDL Cholesterol Cal: 23 mg/dL (ref 5–40)

## 2023-03-14 LAB — CBC WITH DIFFERENTIAL/PLATELET
Basophils Absolute: 0 10*3/uL (ref 0.0–0.2)
Basos: 1 %
EOS (ABSOLUTE): 0.1 10*3/uL (ref 0.0–0.4)
Eos: 3 %
Hematocrit: 38.9 % (ref 34.0–46.6)
Hemoglobin: 12.4 g/dL (ref 11.1–15.9)
Immature Grans (Abs): 0 10*3/uL (ref 0.0–0.1)
Immature Granulocytes: 0 %
Lymphocytes Absolute: 1.8 10*3/uL (ref 0.7–3.1)
Lymphs: 52 %
MCH: 28.4 pg (ref 26.6–33.0)
MCHC: 31.9 g/dL (ref 31.5–35.7)
MCV: 89 fL (ref 79–97)
Monocytes Absolute: 0.3 10*3/uL (ref 0.1–0.9)
Monocytes: 7 %
Neutrophils Absolute: 1.3 10*3/uL — ABNORMAL LOW (ref 1.4–7.0)
Neutrophils: 37 %
Platelets: 216 10*3/uL (ref 150–450)
RBC: 4.36 x10E6/uL (ref 3.77–5.28)
RDW: 14.5 % (ref 11.7–15.4)
WBC: 3.4 10*3/uL (ref 3.4–10.8)

## 2023-03-14 LAB — HEMOGLOBIN A1C
Est. average glucose Bld gHb Est-mCnc: 123 mg/dL
Hgb A1c MFr Bld: 5.9 % — ABNORMAL HIGH (ref 4.8–5.6)

## 2023-03-19 ENCOUNTER — Other Ambulatory Visit: Payer: Self-pay | Admitting: Family Medicine

## 2023-03-19 ENCOUNTER — Ambulatory Visit (HOSPITAL_COMMUNITY): Payer: Medicaid Other | Attending: Cardiovascular Disease

## 2023-03-19 DIAGNOSIS — R011 Cardiac murmur, unspecified: Secondary | ICD-10-CM | POA: Diagnosis not present

## 2023-03-19 DIAGNOSIS — I340 Nonrheumatic mitral (valve) insufficiency: Secondary | ICD-10-CM

## 2023-03-19 LAB — ECHOCARDIOGRAM COMPLETE
Area-P 1/2: 3.77 cm2
P 1/2 time: 605 msec
S' Lateral: 3 cm

## 2023-04-27 ENCOUNTER — Ambulatory Visit: Payer: Medicaid Other

## 2023-05-08 ENCOUNTER — Ambulatory Visit: Payer: Medicaid Other | Attending: Family Medicine

## 2023-05-08 DIAGNOSIS — Z23 Encounter for immunization: Secondary | ICD-10-CM | POA: Diagnosis not present

## 2023-05-08 NOTE — Progress Notes (Signed)
Shingrix vaccine administered Left deltoid per protocols.  Information sheet given. Patient denies and pain or discomfort at injection site. Tolerated injection well no reaction.

## 2023-05-13 ENCOUNTER — Encounter: Payer: Self-pay | Admitting: Cardiovascular Disease
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
## 2023-05-15 ENCOUNTER — Encounter: Payer: Self-pay | Admitting: Cardiovascular Disease

## 2023-05-15 ENCOUNTER — Ambulatory Visit: Payer: Medicaid Other | Attending: Cardiovascular Disease | Admitting: Cardiovascular Disease

## 2023-05-15 VITALS — BP 126/78 | HR 65 | Ht 68.0 in | Wt 244.0 lb

## 2023-05-15 DIAGNOSIS — R011 Cardiac murmur, unspecified: Secondary | ICD-10-CM

## 2023-05-15 DIAGNOSIS — I340 Nonrheumatic mitral (valve) insufficiency: Secondary | ICD-10-CM

## 2023-05-15 NOTE — Patient Instructions (Signed)
Medication Instructions:  Your physician recommends that you continue on your current medications as directed. Please refer to the Current Medication list given to you today.  *If you need a refill on your cardiac medications before your next appointment, please call your pharmacy*  Lab Work: NONE If you have labs (blood work) drawn today and your tests are completely normal, you will receive your results only by: MyChart Message (if you have MyChart) OR A paper copy in the mail If you have any lab test that is abnormal or we need to change your treatment, we will call you to review the results.  Testing/Procedures: ECHO (in 1 year, about a week prior to OV) Your physician has requested that you have an echocardiogram. Echocardiography is a painless test that uses sound waves to create images of your heart. It provides your doctor with information about the size and shape of your heart and how well your heart's chambers and valves are working. This procedure takes approximately one hour. There are no restrictions for this procedure. Please do NOT wear cologne, perfume, aftershave, or lotions (deodorant is allowed). Please arrive 15 minutes prior to your appointment time.  Follow-Up: At [facility], you and your health needs are our priority.  As part of our continuing mission to provide you with exceptional heart care, we have created designated Provider Care Teams.  These Care Teams include your primary Cardiologist (physician) and Advanced Practice Providers (APPs -  Physician Assistants and Nurse Practitioners) who all work together to provide you with the care you need, when you need it.  Your next appointment:   1 year(s)  Provider:   Kristeen Miss, MD

## 2023-05-19 ENCOUNTER — Other Ambulatory Visit: Payer: Self-pay | Admitting: Family Medicine

## 2023-05-19 DIAGNOSIS — I10 Essential (primary) hypertension: Secondary | ICD-10-CM

## 2023-05-22 ENCOUNTER — Ambulatory Visit: Payer: Self-pay | Admitting: *Deleted

## 2023-05-22 ENCOUNTER — Telehealth: Payer: Self-pay

## 2023-05-22 NOTE — Telephone Encounter (Signed)
Reason for Disposition  [1] SEVERE pain (e.g., excruciating, unable to walk) AND [2] not improved after 2 hours of pain medicine  Answer Assessment - Initial Assessment Questions 1. LOCATION and RADIATION: "Where is the pain located?"      Both knees- left knee due surgery- had to delay due to death of her mother 2. QUALITY: "What does the pain feel like?"  (e.g., sharp, dull, aching, burning)     Side of R knee- severe- radiated down 3. SEVERITY: "How bad is the pain?" "What does it keep you from doing?"   (Scale 1-10; or mild, moderate, severe)   -  MILD (1-3): doesn't interfere with normal activities    -  MODERATE (4-7): interferes with normal activities (e.g., work or school) or awakens from sleep, limping    -  SEVERE (8-10): excruciating pain, unable to do any normal activities, unable to walk     severe 4. ONSET: "When did the pain start?" "Does it come and go, or is it there all the time?"     Left knee chronic, R knee- Friday- got worse 5. RECURRENT: "Have you had this pain before?" If Yes, ask: "When, and what happened then?"     Patient needs surgery 6. SETTING: "Has there been any recent work, exercise or other activity that involved that part of the body?"      Patient has been out of town- her mother passed 7. AGGRAVATING FACTORS: "What makes the knee pain worse?" (e.g., walking, climbing stairs, running)     walking 8. ASSOCIATED SYMPTOMS: "Is there any swelling or redness of the knee?"     Both swollen 9. OTHER SYMPTOMS: "Do you have any other symptoms?" (e.g., chest pain, difficulty breathing, fever, calf pain)     no  Protocols used: Knee Pain-A-AH

## 2023-05-22 NOTE — Telephone Encounter (Signed)
  Chief Complaint: bilateral knee pain Symptoms: bilateral knee pain- patient is due surgery on L knee- but had to cancel due to death of mother. Patient is now having pain on R knee- swelling in both knees Frequency: L knee-chronic, R knee- worse on Friday Pertinent Negatives: Patient denies chest pain, difficulty breathing, fever, calf pain  Disposition: [] ED /[] Urgent Care (no appt availability in office) / [x] Appointment(In office/virtual)/ []  [facility]/ [] Home Care/ [] Refused Recommended Disposition /[] [facility]/ []  Follow-up with PCP Additional Notes: Patient has to have transportation- so she requested appointment tomorrow- outside disposition- patient advised to also follow up with surgeon and ortho provider.

## 2023-05-22 NOTE — Telephone Encounter (Signed)
Copied from CRM [id number]. Topic: Referral - Request for Referral >> May 22, 2023  9:05 AM Franchot Heidelberg wrote: Has patient seen PCP for this complaint? Yes.   *If NO, is insurance requiring patient see PCP for this issue before PCP can refer them? Referral for which specialty: Ortho Preferred provider/office: Highest recommended Reason for referral: Severe pain in both knees, struggling to walk

## 2023-05-23 ENCOUNTER — Encounter: Payer: Self-pay | Admitting: Critical Care Medicine

## 2023-05-23 ENCOUNTER — Ambulatory Visit: Payer: Medicaid Other | Attending: Critical Care Medicine | Admitting: Critical Care Medicine

## 2023-05-23 VITALS — BP 113/76 | Temp 92.0°F | Wt 243.6 lb

## 2023-05-23 DIAGNOSIS — M25561 Pain in right knee: Secondary | ICD-10-CM | POA: Diagnosis not present

## 2023-05-23 DIAGNOSIS — M1712 Unilateral primary osteoarthritis, left knee: Secondary | ICD-10-CM

## 2023-05-23 MED ORDER — LIDOCAINE 5 % EX PTCH: 1.0000 | MEDICATED_PATCH | CUTANEOUS | 0 refills | Status: DC

## 2023-05-23 NOTE — Patient Instructions (Signed)
Apply lidocaine patch to both knees prescription sent 12 hours a day  Take Tylenol 500 mg to 4 times daily for pain Ice both knees once daily place the ice in a bag and apply to the knees  See exercises for the right knee attached  I will notify Dr. Roda Shutters that you need a follow-up appointment you likely need a knee replacement on the left he may be able to do an injection on the right to relieve pain  Keep next appt with Dr Alvis Lemmings as scheduled

## 2023-05-23 NOTE — Progress Notes (Signed)
Acute Office Visit  Subjective:     Patient ID: Alison Ward, female    DOB: 1961/03/17, 62 y.o.   MRN: [id number]  Chief Complaint  Patient presents with   Knee Pain    HPI Patient is in today for bilateral knee pain PCP [facility] Patient has severe degenerative joint disease of the left knee and is contemplating total knee replacement.  She recently had acute onset of pain in the right knee which is new.  Most of the pain is lateral at this time.  She does have physical therapy ongoing.  She is here for assessment for the right knee.  She will need follow-up with orthopedics in general.  Review of Systems  Constitutional:  Negative for chills, diaphoresis, fever, malaise/fatigue and weight loss.  HENT:  Negative for congestion, hearing loss, nosebleeds, sore throat and tinnitus.   Eyes:  Negative for blurred vision, photophobia and redness.  Respiratory:  Negative for cough, hemoptysis, sputum production, shortness of breath, wheezing and stridor.   Cardiovascular:  Negative for chest pain, palpitations, orthopnea, claudication, leg swelling and PND.  Gastrointestinal:  Negative for abdominal pain, blood in stool, constipation, diarrhea, heartburn, nausea and vomiting.  Genitourinary:  Negative for dysuria, flank pain, frequency, hematuria and urgency.  Musculoskeletal:  Positive for joint pain. Negative for back pain, falls, myalgias and neck pain.  Skin:  Negative for itching and rash.  Neurological:  Negative for dizziness, tingling, tremors, sensory change, speech change, focal weakness, seizures, loss of consciousness, weakness and headaches.  Endo/Heme/Allergies:  Negative for environmental allergies and polydipsia. Does not bruise/bleed easily.  Psychiatric/Behavioral:  Negative for depression, memory loss, substance abuse and suicidal ideas. The patient is not nervous/anxious and does not have insomnia.         Objective:    BP 113/76 (BP Location: Left Arm,  Patient Position: Sitting, Cuff Size: Normal)   Temp (!) 92 F (33.3 C)   Wt 243 lb 9.6 oz (110.5 kg)   LMP 11/24/2011   SpO2 98%   BMI 37.04 kg/m    Physical Exam Vitals reviewed.  Constitutional:      Appearance: Normal appearance. She is well-developed. She is obese. She is not diaphoretic.  HENT:     Head: Normocephalic and atraumatic.     Nose: No nasal deformity, septal deviation, mucosal edema or rhinorrhea.     Right Sinus: No maxillary sinus tenderness or frontal sinus tenderness.     Left Sinus: No maxillary sinus tenderness or frontal sinus tenderness.     Mouth/Throat:     Pharynx: No oropharyngeal exudate.  Eyes:     General: No scleral icterus.    Conjunctiva/sclera: Conjunctivae normal.     Pupils: Pupils are equal, round, and reactive to light.  Neck:     Thyroid: No thyromegaly.     Vascular: No carotid bruit or JVD.     Trachea: Trachea normal. No tracheal tenderness or tracheal deviation.  Cardiovascular:     Rate and Rhythm: Normal rate and regular rhythm.     Chest Wall: PMI is not displaced.     Pulses: Normal pulses. No decreased pulses.     Heart sounds: Normal heart sounds, S1 normal and S2 normal. Heart sounds not distant. No murmur heard.    No systolic murmur is present.     No diastolic murmur is present.     No friction rub. No gallop. No S3 or S4 sounds.  Pulmonary:     Effort: No  tachypnea, accessory muscle usage or respiratory distress.     Breath sounds: No stridor. No decreased breath sounds, wheezing, rhonchi or rales.  Chest:     Chest wall: No tenderness.  Abdominal:     General: Bowel sounds are normal. There is no distension.     Palpations: Abdomen is soft. Abdomen is not rigid.     Tenderness: There is no abdominal tenderness. There is no guarding or rebound.  Musculoskeletal:        General: Swelling, tenderness and deformity present. Normal range of motion.     Cervical back: Normal range of motion and neck supple. No edema,  erythema or rigidity. No muscular tenderness. Normal range of motion.     Comments: Severe deformities of the left knee as documented below and before  Acute onset of pain laterally right knee with mild effusion  Lymphadenopathy:     Head:     Right side of head: No submental or submandibular adenopathy.     Left side of head: No submental or submandibular adenopathy.     Cervical: No cervical adenopathy.  Skin:    General: Skin is warm and dry.     Coloration: Skin is not pale.     Findings: No rash.     Nails: There is no clubbing.  Neurological:     Mental Status: She is alert and oriented to person, place, and time.     Sensory: No sensory deficit.  Psychiatric:        Speech: Speech normal.        Behavior: Behavior normal.     No results found for any visits on 05/23/23.      Assessment & Plan:   Problem List Items Addressed This Visit       Musculoskeletal and Integument   Primary osteoarthritis of left knee - Primary    Needs follow-up for consideration of left total joint replacement      Relevant Orders   Ambulatory referral to Orthopedics     Other   Acute pain of right knee    Plan for this is for the patient received lidocaine patch to the right knee ice the knee and take Tylenol extra strength to 4 times daily  Patient is referred back to orthopedics      Relevant Orders   Ambulatory referral to Orthopedics    Meds ordered this encounter  Medications   lidocaine (LIDODERM) 5 %    Sig: Place 1 patch onto the skin daily. Remove & Discard patch within 12 hours or as directed by MD    Dispense:  30 patch    Refill:  0    Return for chronic pain.  Shan Levans, MD

## 2023-05-23 NOTE — Assessment & Plan Note (Signed)
Plan for this is for the patient received lidocaine patch to the right knee ice the knee and take Tylenol extra strength to 4 times daily  Patient is referred back to orthopedics

## 2023-05-23 NOTE — Assessment & Plan Note (Signed)
Needs follow-up for consideration of left total joint replacement

## 2023-06-05 ENCOUNTER — Ambulatory Visit: Payer: Medicaid Other | Admitting: Orthopaedic Surgery

## 2023-06-05 ENCOUNTER — Other Ambulatory Visit: Payer: Self-pay

## 2023-06-05 ENCOUNTER — Other Ambulatory Visit (INDEPENDENT_AMBULATORY_CARE_PROVIDER_SITE_OTHER): Payer: Medicaid Other

## 2023-06-05 DIAGNOSIS — M1711 Unilateral primary osteoarthritis, right knee: Secondary | ICD-10-CM | POA: Diagnosis not present

## 2023-06-05 DIAGNOSIS — M25561 Pain in right knee: Secondary | ICD-10-CM | POA: Diagnosis not present

## 2023-06-05 DIAGNOSIS — M1712 Unilateral primary osteoarthritis, left knee: Secondary | ICD-10-CM

## 2023-06-05 NOTE — Progress Notes (Signed)
Office Visit Note   Patient: Alison Ward           Date of Birth: 04-03-1961           MRN: [id number] Visit Date: 06/05/2023              Requested by: Storm Frisk, MD [address] [place],  [place] [address] PCP: Hoy Register, MD   Assessment & Plan: Visit Diagnoses:  1. Primary osteoarthritis of left knee   2. Acute pain of right knee     Plan: Impression is severe left knee degenerative joint disease secondary to Osteoarthritis.  Bone on bone joint space narrowing is seen on radiographs with neutral alignment.  At this point, conservative treatments fail to provide any significant relief and the pain is severely affecting ADLs and quality of life.  Based on treatment options, the patient has elected to move forward with a knee replacement.  We have discussed the surgical risks that include but are not limited to infection, DVT, leg length discrepancy, stiffness, numbness, tingling, incomplete relief of pain.  Recovery and prognosis were also reviewed.    Anticoagulants: aspirin 81 mg daily Postop anticoagulation: Aspirin 81 mg Diabetic: No  Nickel allergy: No Prior DVT/PE: No Tobacco use: No Clearances needed for surgery: [facility] PCP, [facility] Anticipated discharge dispo: Home   In regards to the right knee, could be degenerative lateral meniscus tear or aggravation of underlying OA.  She would like to continue symptomatic treatment and activity modification for now.  Follow-Up Instructions: No follow-ups on file.   Orders:  Orders Placed This Encounter  Procedures   XR KNEE 3 VIEW RIGHT   XR KNEE 3 VIEW LEFT   No orders of the defined types were placed in this encounter.     Procedures: No procedures performed   Clinical Data: No additional findings.   Subjective: Chief Complaint  Patient presents with   Right Knee - Pain   Left Knee - Pain    HPI Alison Ward returns today for follow-up for bilateral knee pain.   We saw her last year for knee pain due to end-stage DJD.  At the time she was not able to do surgery because she was taking care of her mother but her mother has unfortunately passed away.  Her daughters live about 15 minutes away.  At this point she feels that she is ready to undergo left knee replacement.  She has been having some pain in the right knee for the last couple weeks.  She may have aggravated this when getting out of her car. Review of Systems  Constitutional: Negative.   HENT: Negative.    Eyes: Negative.   Respiratory: Negative.    Cardiovascular: Negative.   Endocrine: Negative.   Musculoskeletal: Negative.   Neurological: Negative.   Hematological: Negative.   Psychiatric/Behavioral: Negative.    All other systems reviewed and are negative.    Objective: Vital Signs: LMP 11/24/2011   Physical Exam Vitals and nursing note reviewed.  Constitutional:      Appearance: She is well-developed.  HENT:     Head: Normocephalic and atraumatic.  Pulmonary:     Effort: Pulmonary effort is normal.  Abdominal:     Palpations: Abdomen is soft.  Musculoskeletal:     Cervical back: Neck supple.  Skin:    General: Skin is warm.     Capillary Refill: Capillary refill takes less than 2 seconds.  Neurological:  Mental Status: She is alert and oriented to person, place, and time.  Psychiatric:        Behavior: Behavior normal.        Thought Content: Thought content normal.        Judgment: Judgment normal.     Ortho Exam Examination left knee shows medial and lateral joint line tenderness with crepitus and pain.  Collaterals and cruciates are stable.  Trace effusion.  Range of motion is 0 to 110 degrees.  Examination of right knee shows lateral joint line tenderness.  Trace effusion.  Pain and crepitus with range of motion.  Range of motion is 0 to 110 degrees. Specialty Comments:  No specialty comments available.  Imaging: XR KNEE 3 VIEW RIGHT  Result Date:  06/05/2023 X-rays demonstrate severe osteoarthritis.  Bone-on-bone joint space narrowing.  XR KNEE 3 VIEW LEFT  Result Date: 06/05/2023 Advanced tricompartmental degenerative joint disease.  Bone-on-bone joint space narrowing.    PMFS History: Patient Active Problem List   Diagnosis Date Noted   Acute pain of right knee 05/23/2023   Murmur, cardiac 01/24/2023   Primary osteoarthritis of left knee 05/19/2022   Lumbar radiculopathy 03/11/2018   Ganglion cyst of right foot 07/04/2017   Chronic tension-type headache, not intractable 12/30/2015   Post-operative state 12/15/2015   Simple partial seizure disorder (HCC) 11/04/2015   Left sided numbness 09/25/2015   Leukopenia 09/25/2015   Tension vascular headache 09/07/2015   Meralgia paresthetica of left side 09/07/2015   Fainting spell 09/07/2015   Prediabetes 09/02/2015   Stye 08/06/2014   Essential hypertension, benign 04/16/2014   CVA (cerebral vascular accident) (HCC) 04/16/2014   Headache 04/16/2014   Tension headache 03/06/2014   Obesity, unspecified 08/13/2013   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   ICH (intracerebral hemorrhage) (HCC) 07/21/2013   Hypokalemia 07/21/2013   Intracerebral hemorrhage (HCC) 07/06/2013   Essential hypertension 02/24/2009   Past Medical History:  Diagnosis Date   Chronic headaches    daily, bitemporal, associated with tingling in face   Heart murmur    Hypertension    Seizures (HCC)    partial seizures last one on 10/22/15  On Keppra currently - last seizure 08-2018 x 2 small issues    Stroke (HCC) 07/05/2013    Family History  Problem Relation Age of Onset   Hypertension Mother    Diabetes Mother    Stroke Mother    Hypertension Father    Colon cancer Neg Hx    Breast cancer Neg Hx    Colon polyps Neg Hx    Esophageal cancer Neg Hx    Rectal cancer Neg Hx    Stomach cancer Neg Hx     Past Surgical History:  Procedure Laterality Date   ABDOMINAL HYSTERECTOMY  N/A 12/15/2015   Procedure: HYSTERECTOMY ABDOMINAL;  Surgeon: Allie Bossier, MD;  Location: [facility];  Service: Gynecology;  Laterality: N/A;   CESAREAN SECTION     ceserean section     COLONOSCOPY     POLYPECTOMY     SALPINGOOPHORECTOMY Bilateral 12/15/2015   Procedure: SALPINGO OOPHORECTOMY;  Surgeon: Allie Bossier, MD;  Location: [facility];  Service: Gynecology;  Laterality: Bilateral;   VENTRICULOSTOMY Left 07/07/2013   Procedure: VENTRICULOSTOMY;  Surgeon: Cristi Loron, MD;  Location: [facility];  Service: Neurosurgery;  Laterality: Left;  Left Ventriculostomy and Removal of Right Vetriculostomy   Social History   Occupational History   Occupation: [employer]  Tobacco Use  Smoking status: Never   Smokeless tobacco: Never  Vaping Use   Vaping status: Never Used  Substance and Sexual Activity   Alcohol use: No   Drug use: No   Sexual activity: Not Currently    Birth control/protection: Surgical

## 2023-07-30 ENCOUNTER — Ambulatory Visit: Payer: Self-pay | Admitting: Family Medicine

## 2023-07-31 ENCOUNTER — Encounter: Payer: Self-pay | Admitting: Family Medicine

## 2023-07-31 ENCOUNTER — Telehealth: Payer: Self-pay | Admitting: Family Medicine

## 2023-07-31 ENCOUNTER — Telehealth: Payer: Self-pay

## 2023-07-31 ENCOUNTER — Ambulatory Visit: Payer: Medicaid Other | Attending: Family Medicine | Admitting: Family Medicine

## 2023-07-31 VITALS — BP 100/67 | HR 67 | Ht 68.0 in | Wt 245.4 lb

## 2023-07-31 DIAGNOSIS — Z6837 Body mass index (BMI) 37.0-37.9, adult: Secondary | ICD-10-CM | POA: Diagnosis not present

## 2023-07-31 DIAGNOSIS — I10 Essential (primary) hypertension: Secondary | ICD-10-CM | POA: Diagnosis not present

## 2023-07-31 DIAGNOSIS — E66812 Obesity, class 2: Secondary | ICD-10-CM

## 2023-07-31 DIAGNOSIS — E7800 Pure hypercholesterolemia, unspecified: Secondary | ICD-10-CM

## 2023-07-31 DIAGNOSIS — I340 Nonrheumatic mitral (valve) insufficiency: Secondary | ICD-10-CM | POA: Diagnosis not present

## 2023-07-31 DIAGNOSIS — E6609 Other obesity due to excess calories: Secondary | ICD-10-CM | POA: Diagnosis not present

## 2023-07-31 DIAGNOSIS — R7303 Prediabetes: Secondary | ICD-10-CM | POA: Diagnosis not present

## 2023-07-31 MED ORDER — LISINOPRIL-HYDROCHLOROTHIAZIDE 20-25 MG PO TABS
1.0000 | ORAL_TABLET | Freq: Every day | ORAL | 1 refills | Status: DC
Start: 2023-07-31 — End: 2024-01-29

## 2023-07-31 MED ORDER — ATORVASTATIN CALCIUM 40 MG PO TABS
40.0000 mg | ORAL_TABLET | Freq: Every day | ORAL | 1 refills | Status: DC
Start: 2023-07-31 — End: 2024-01-29

## 2023-07-31 NOTE — Progress Notes (Signed)
Subjective:  Patient ID: Alison Ward, female    DOB: Jun 27, 1961  Age: 62 y.o. MRN: [id number]  CC: Medical Management of Chronic Issues   HPI Deannie Resetar is a 62 y.o. year old female with a history of hypertension, chronic headaches, previous history of right  Hemorrhagic stroke status post ventriculostomy in 06/2013, Prediabetes (A1c 5.9), meralgia paresthetica, hyperlipidemia.   Interval History: Discussed the use of AI scribe software for clinical note transcription with the patient, who gave verbal consent to proceed.  She reports a recent weight loss and has been working with a nutritionist friend to improve her diet. She has been adhering to her medication regimen, including lisinopril/hydrochlorothiazide, and atorvastatin.  The patient had a recent cardiology consult due to a findings of a heart murmur on follow-up echo revealing mitral valve regurg, left atrial dilatation. The cardiologist recommended monitoring and a follow-up in one year. She felt satisfied with the information provided by the cardiologist and is motivated to continue working on her weight and diet.  The patient also reports a history of vertigo, which has improved with vestibular rehab and ongoing exercises. She has noticed an improvement in her mobility, including less knee pain, which she attributes to the exercises and weight loss. She had a recent issue with her knee due to difficulty getting into a small vehicle, but the pain has since improved. Endorses adherence with her antihypertensive and statin.  The patient also reports a history of a total hysterectomy and has had a mammogram earlier in the year. She has been active, including walking at church and doing chair exercises at home.        Past Medical History:  Diagnosis Date   Chronic headaches    daily, bitemporal, associated with tingling in face   Heart murmur    Hypertension    Seizures (HCC)    partial seizures last  one on 10/22/15  On Keppra currently - last seizure 08-2018 x 2 small issues    Stroke (HCC) 07/05/2013    Past Surgical History:  Procedure Laterality Date   ABDOMINAL HYSTERECTOMY N/A 12/15/2015   Procedure: HYSTERECTOMY ABDOMINAL;  Surgeon: Allie Bossier, MD;  Location: [facility];  Service: Gynecology;  Laterality: N/A;   CESAREAN SECTION     ceserean section     COLONOSCOPY     POLYPECTOMY     SALPINGOOPHORECTOMY Bilateral 12/15/2015   Procedure: SALPINGO OOPHORECTOMY;  Surgeon: Allie Bossier, MD;  Location: [facility];  Service: Gynecology;  Laterality: Bilateral;   VENTRICULOSTOMY Left 07/07/2013   Procedure: VENTRICULOSTOMY;  Surgeon: Cristi Loron, MD;  Location: [facility];  Service: Neurosurgery;  Laterality: Left;  Left Ventriculostomy and Removal of Right Vetriculostomy    Family History  Problem Relation Age of Onset   Hypertension Mother    Diabetes Mother    Stroke Mother    Hypertension Father    Colon cancer Neg Hx    Breast cancer Neg Hx    Colon polyps Neg Hx    Esophageal cancer Neg Hx    Rectal cancer Neg Hx    Stomach cancer Neg Hx     Social History   Socioeconomic History   Marital status: Divorced    Spouse name: Not on file   Number of children: 3   Years of education: college   Highest education level: Not on file  Occupational History   Occupation: [employer]  Tobacco Use   Smoking status: Never  Smokeless tobacco: Never  Vaping Use   Vaping status: Never Used  Substance and Sexual Activity   Alcohol use: No   Drug use: No   Sexual activity: Not Currently    Birth control/protection: Surgical  Other Topics Concern   Not on file  Social History Narrative   Not on file   Social Determinants of Health   Financial Resource Strain: Not on file  Food Insecurity: Not on file  Transportation Needs: Not on file  Physical Activity: Not on file  Stress: Not on file  Social Connections: Not on file    Allergies  Allergen Reactions    Elavil [Amitriptyline] Other (See Comments)    DIZZINESS   Misc. Sulfonamide Containing Compounds Other (See Comments)    Suicidal Thoughts- Trokendi   Topiramate Er Other (See Comments)    Suicidal Thoughts- Trokendi    Outpatient Medications Prior to Visit  Medication Sig Dispense Refill   aspirin EC 81 MG tablet Take 1 tablet (81 mg total) by mouth daily. 30 tablet 1   gabapentin (NEURONTIN) 600 MG tablet Take 1 tablet (600 mg total) by mouth 3 (three) times daily. 270 tablet 1   levETIRAcetam (KEPPRA XR) 500 MG 24 hr tablet Take 1 tablet (500 mg total) by mouth at bedtime. 90 tablet 3   lidocaine (LIDODERM) 5 % Place 1 patch onto the skin daily. Remove & Discard patch within 12 hours or as directed by MD 30 patch 0   atorvastatin (LIPITOR) 40 MG tablet TAKE 1 TABLET BY MOUTH EVERY DAY 90 tablet 2   lisinopril-hydrochlorothiazide (ZESTORETIC) 20-25 MG tablet TAKE 1 TABLET BY MOUTH EVERY DAY 90 tablet 0   No facility-administered medications prior to visit.     ROS Review of Systems  Constitutional:  Negative for activity change and appetite change.  HENT:  Negative for sinus pressure and sore throat.   Respiratory:  Negative for chest tightness, shortness of breath and wheezing.   Cardiovascular:  Negative for chest pain and palpitations.  Gastrointestinal:  Negative for abdominal distention, abdominal pain and constipation.  Genitourinary: Negative.   Musculoskeletal: Negative.   Psychiatric/Behavioral:  Negative for behavioral problems and dysphoric mood.     Objective:  BP 100/67   Pulse 67   Ht 5\' 8"  (1.727 m)   Wt 245 lb 6.4 oz (111.3 kg)   LMP 11/24/2011   SpO2 98%   BMI 37.31 kg/m      07/31/2023   10:25 AM 05/23/2023   10:40 AM 05/15/2023    8:50 AM  BP/Weight  Systolic BP 100 113 126  Diastolic BP 67 76 78  Wt. (Lbs) 245.4 243.6 244  BMI 37.31 kg/m2 37.04 kg/m2 37.1 kg/m2      Physical Exam Constitutional:      Appearance: She is well-developed.   Cardiovascular:     Rate and Rhythm: Normal rate.     Heart sounds: Murmur heard.  Pulmonary:     Effort: Pulmonary effort is normal.     Breath sounds: Normal breath sounds. No wheezing or rales.  Chest:     Chest wall: No tenderness.  Abdominal:     General: Bowel sounds are normal. There is no distension.     Palpations: Abdomen is soft. There is no mass.     Tenderness: There is no abdominal tenderness.  Musculoskeletal:        General: Normal range of motion.     Right lower leg: No edema.     Left  lower leg: No edema.  Neurological:     Mental Status: She is alert and oriented to person, place, and time.  Psychiatric:        Mood and Affect: Mood normal.        Latest Ref Rng & Units 03/13/2023    9:55 AM 08/01/2022   10:16 AM 01/02/2022    4:45 PM  CMP  Glucose 70 - 99 mg/dL 79  83  85   BUN 8 - 27 mg/dL 12  14  16    Creatinine 0.57 - 1.00 mg/dL 3.24  4.01  0.27   Sodium 134 - 144 mmol/L 143  141  148   Potassium 3.5 - 5.2 mmol/L 4.1  4.1  4.3   Chloride 96 - 106 mmol/L 103  99  105   CO2 20 - 29 mmol/L 27  29  26    Calcium 8.7 - 10.3 mg/dL 9.8  9.8  9.7   Total Protein 6.0 - 8.5 g/dL 7.0  7.3  7.3   Total Bilirubin 0.0 - 1.2 mg/dL 1.4  0.8  0.9   Alkaline Phos 44 - 121 IU/L 59  63  67   AST 0 - 40 IU/L 17  16  22    ALT 0 - 32 IU/L 12  11  12      Lipid Panel     Component Value Date/Time   CHOL 147 03/13/2023 0955   TRIG 131 03/13/2023 0955   HDL 43 03/13/2023 0955   CHOLHDL 3.7 01/20/2020 0841   CHOLHDL 6.4 (H) 12/06/2016 1012   VLDL 35 (H) 12/06/2016 1012   LDLCALC 81 03/13/2023 0955    CBC    Component Value Date/Time   WBC 3.4 03/13/2023 0955   WBC 4.0 12/06/2016 1012   RBC 4.36 03/13/2023 0955   RBC 4.59 12/06/2016 1012   HGB 12.4 03/13/2023 0955   HCT 38.9 03/13/2023 0955   PLT 216 03/13/2023 0955   MCV 89 03/13/2023 0955   MCH 28.4 03/13/2023 0955   MCH 27.9 12/06/2016 1012   MCHC 31.9 03/13/2023 0955   MCHC 32.2 12/06/2016 1012   RDW  14.5 03/13/2023 0955   LYMPHSABS 1.8 03/13/2023 0955   MONOABS 400 12/06/2016 1012   EOSABS 0.1 03/13/2023 0955   BASOSABS 0.0 03/13/2023 0955    Lab Results  Component Value Date   HGBA1C 5.9 (H) 03/13/2023    Assessment & Plan:      Valvular Heart Disease/mitral valve insufficiency Recent echocardiogram showed mitral valve insufficiency, left atrial dilatation. Cardiologist recommended monitoring with a follow-up in one year. -Continue current management and follow-up with cardiologist in one year.  Hypertension Well controlled on Lisinopril-Hydrochlorothiazide. -Continue Lisinopril-Hydrochlorothiazide. -Counseled on blood pressure goal of less than 130/80, low-sodium, DASH diet, medication compliance, 150 minutes of moderate intensity exercise per week. Discussed medication compliance, adverse effects.   Hyperlipidemia Controlled on Atorvastatin. -Continue Atorvastatin.  Vertigo Resolved with vestibular rehab and ongoing exercises. -Continue exercises as taught in vestibular rehab.  Knee Pain Improved with exercises. -Continue exercises.  Moderate obesity Patient has lost weight and is working with a nutritionist friend. -Continue current dietary modifications and exercise regimen.  General Health Maintenance -Order blood tests to check kidney function and electrolytes due to Lisinopril-Hydrochlorothiazide use. -Refill current medications. -Plan for a follow-up visit in six months.          Meds ordered this encounter  Medications   atorvastatin (LIPITOR) 40 MG tablet    Sig: Take 1 tablet (40  mg total) by mouth daily.    Dispense:  90 tablet    Refill:  1   lisinopril-hydrochlorothiazide (ZESTORETIC) 20-25 MG tablet    Sig: Take 1 tablet by mouth daily.    Dispense:  90 tablet    Refill:  1    Follow-up: Return in about 6 months (around 01/29/2024) for Chronic medical conditions.       Hoy Register, MD, FAAFP. [facility] [place], [place] [phone]   07/31/2023, 11:59 AM

## 2023-07-31 NOTE — Telephone Encounter (Signed)
Patient called to get an update on transportation for her visit today. Please advise.

## 2023-07-31 NOTE — Telephone Encounter (Signed)
Patient is at appointment.

## 2023-07-31 NOTE — Telephone Encounter (Signed)
Pt wants to know if you can get her a ride to her appt Today.  She doesn't want to use her insurance company b/c they have low cards and it messes up her knees.   She hopes someone will pick her up.

## 2023-07-31 NOTE — Patient Instructions (Signed)
Calorie Counting for Weight Loss Calories are units of energy. Your body needs a certain number of calories from food to keep going throughout the day. When you eat or drink more calories than your body needs, your body stores the extra calories mostly as fat. When you eat or drink fewer calories than your body needs, your body burns fat to get the energy it needs. Calorie counting means keeping track of how many calories you eat and drink each day. Calorie counting can be helpful if you need to lose weight. If you eat fewer calories than your body needs, you should lose weight. Ask your health care provider what a healthy weight is for you. For calorie counting to work, you will need to eat the right number of calories each day to lose a healthy amount of weight per week. A dietitian can help you figure out how many calories you need in a day and will suggest ways to reach your calorie goal. A healthy amount of weight to lose each week is usually 1-2 lb (0.5-0.9 kg). This usually means that your daily calorie intake should be reduced by 500-750 calories. Eating 1,200-1,500 calories a day can help most women lose weight. Eating 1,500-1,800 calories a day can help most men lose weight. What do I need to know about calorie counting? Work with your health care provider or dietitian to determine how many calories you should get each day. To meet your daily calorie goal, you will need to: Find out how many calories are in each food that you would like to eat. Try to do this before you eat. Decide how much of the food you plan to eat. Keep a food log. Do this by writing down what you ate and how many calories it had. To successfully lose weight, it is important to balance calorie counting with a healthy lifestyle that includes regular activity. Where do I find calorie information?  The number of calories in a food can be found on a Nutrition Facts label. If a food does not have a Nutrition Facts label, try  to look up the calories online or ask your dietitian for help. Remember that calories are listed per serving. If you choose to have more than one serving of a food, you will have to multiply the calories per serving by the number of servings you plan to eat. For example, the label on a package of bread might say that a serving size is 1 slice and that there are 90 calories in a serving. If you eat 1 slice, you will have eaten 90 calories. If you eat 2 slices, you will have eaten 180 calories. How do I keep a food log? After each time that you eat, record the following in your food log as soon as possible: What you ate. Be sure to include toppings, sauces, and other extras on the food. How much you ate. This can be measured in cups, ounces, or number of items. How many calories were in each food and drink. The total number of calories in the food you ate. Keep your food log near you, such as in a pocket-sized notebook or on an app or website on your mobile phone. Some programs will calculate calories for you and show you how many calories you have left to meet your daily goal. What are some portion-control tips? Know how many calories are in a serving. This will help you know how many servings you can have of a certain   food. Use a measuring cup to measure serving sizes. You could also try weighing out portions on a kitchen scale. With time, you will be able to estimate serving sizes for some foods. Take time to put servings of different foods on your favorite plates or in your favorite bowls and cups so you know what a serving looks like. Try not to eat straight from a food's packaging, such as from a bag or box. Eating straight from the package makes it hard to see how much you are eating and can lead to overeating. Put the amount you would like to eat in a cup or on a plate to make sure you are eating the right portion. Use smaller plates, glasses, and bowls for smaller portions and to prevent  overeating. Try not to multitask. For example, avoid watching TV or using your computer while eating. If it is time to eat, sit down at a table and enjoy your food. This will help you recognize when you are full. It will also help you be more mindful of what and how much you are eating. What are tips for following this plan? Reading food labels Check the calorie count compared with the serving size. The serving size may be smaller than what you are used to eating. Check the source of the calories. Try to choose foods that are high in protein, fiber, and vitamins, and low in saturated fat, trans fat, and sodium. Shopping Read nutrition labels while you shop. This will help you make healthy decisions about which foods to buy. Pay attention to nutrition labels for low-fat or fat-free foods. These foods sometimes have the same number of calories or more calories than the full-fat versions. They also often have added sugar, starch, or salt to make up for flavor that was removed with the fat. Make a grocery list of lower-calorie foods and stick to it. Cooking Try to cook your favorite foods in a healthier way. For example, try baking instead of frying. Use low-fat dairy products. Meal planning Use more fruits and vegetables. One-half of your plate should be fruits and vegetables. Include lean proteins, such as chicken, turkey, and fish. Lifestyle Each week, aim to do one of the following: 150 minutes of moderate exercise, such as walking. 75 minutes of vigorous exercise, such as running. General information Know how many calories are in the foods you eat most often. This will help you calculate calorie counts faster. Find a way of tracking calories that works for you. Get creative. Try different apps or programs if writing down calories does not work for you. What foods should I eat?  Eat nutritious foods. It is better to have a nutritious, high-calorie food, such as an avocado, than a food with  few nutrients, such as a bag of potato chips. Use your calories on foods and drinks that will fill you up and will not leave you hungry soon after eating. Examples of foods that fill you up are nuts and nut butters, vegetables, lean proteins, and high-fiber foods such as whole grains. High-fiber foods are foods with more than 5 g of fiber per serving. Pay attention to calories in drinks. Low-calorie drinks include water and unsweetened drinks. The items listed above may not be a complete list of foods and beverages you can eat. Contact a dietitian for more information. What foods should I limit? Limit foods or drinks that are not good sources of vitamins, minerals, or protein or that are high in unhealthy fats. These   include: Candy. Other sweets. Sodas, specialty coffee drinks, alcohol, and juice. The items listed above may not be a complete list of foods and beverages you should avoid. Contact a dietitian for more information. How do I count calories when eating out? Pay attention to portions. Often, portions are much larger when eating out. Try these tips to keep portions smaller: Consider sharing a meal instead of getting your own. If you get your own meal, eat only half of it. Before you start eating, ask for a container and put half of your meal into it. When available, consider ordering smaller portions from the menu instead of full portions. Pay attention to your food and drink choices. Knowing the way food is cooked and what is included with the meal can help you eat fewer calories. If calories are listed on the menu, choose the lower-calorie options. Choose dishes that include vegetables, fruits, whole grains, low-fat dairy products, and lean proteins. Choose items that are boiled, broiled, grilled, or steamed. Avoid items that are buttered, battered, fried, or served with cream sauce. Items labeled as crispy are usually fried, unless stated otherwise. Choose water, low-fat milk,  unsweetened iced tea, or other drinks without added sugar. If you want an alcoholic beverage, choose a lower-calorie option, such as a glass of wine or light beer. Ask for dressings, sauces, and syrups on the side. These are usually high in calories, so you should limit the amount you eat. If you want a salad, choose a garden salad and ask for grilled meats. Avoid extra toppings such as bacon, cheese, or fried items. Ask for the dressing on the side, or ask for olive oil and vinegar or lemon to use as dressing. Estimate how many servings of a food you are given. Knowing serving sizes will help you be aware of how much food you are eating at restaurants. Where to find more information Centers for Disease Control and Prevention: www.cdc.gov U.S. Department of Agriculture: myplate.gov Summary Calorie counting means keeping track of how many calories you eat and drink each day. If you eat fewer calories than your body needs, you should lose weight. A healthy amount of weight to lose per week is usually 1-2 lb (0.5-0.9 kg). This usually means reducing your daily calorie intake by 500-750 calories. The number of calories in a food can be found on a Nutrition Facts label. If a food does not have a Nutrition Facts label, try to look up the calories online or ask your dietitian for help. Use smaller plates, glasses, and bowls for smaller portions and to prevent overeating. Use your calories on foods and drinks that will fill you up and not leave you hungry shortly after a meal. This information is not intended to replace advice given to you by your health care provider. Make sure you discuss any questions you have with your health care provider. Document Revised: 11/27/2019 Document Reviewed: 11/27/2019 Elsevier Patient Education  2023 Elsevier Inc.  

## 2023-07-31 NOTE — Telephone Encounter (Signed)
   Pre-operative Risk Assessment    Patient Name: Alison Ward  DOB: 11/06/1960 MRN: [id number]      Request for Surgical Clearance    Procedure:   Left Total Knee  Date of Surgery:  Clearance TBD                                 Surgeon:  Cheral Almas, MD Surgeon's Group or Practice Name:  [facility] number:  [phone] Fax number:  [phone]   Type of Clearance Requested:   - Medical  - Pharmacy:  Hold Aspirin     Type of Anesthesia:  Spinal   Additional requests/questions:   n/a  SignedMelina Schools   07/31/2023, 3:30 PM

## 2023-07-31 NOTE — Telephone Encounter (Signed)
   Name: Berdine Rasmusson  DOB: Sep 19, 1961  MRN: [id number]  Primary Cardiologist: Kristeen Miss, MD   Preoperative team, please contact this patient and set up a phone call appointment for further preoperative risk assessment. Please obtain consent and complete medication review. Thank you for your help.  I confirm that guidance regarding antiplatelet and oral anticoagulation therapy has been completed and, if necessary, noted below.  Regarding ASA therapy, it may be stopped 5-7 days prior to surgery with a plan to resume it as soon as felt to be feasible from a surgical standpoint in the post-operative period.   I also confirmed the patient resides in the state of [place]. As per [facility] Medical Board telemedicine laws, the patient must reside in the state in which the provider is licensed.   Napoleon Form, Leodis Rains, NP 07/31/2023, 3:40 PM [facility]

## 2023-08-01 ENCOUNTER — Telehealth: Payer: Self-pay

## 2023-08-01 LAB — CMP14+EGFR
ALT: 10 [IU]/L (ref 0–32)
AST: 16 [IU]/L (ref 0–40)
Albumin: 4.2 g/dL (ref 3.9–4.9)
Alkaline Phosphatase: 62 [IU]/L (ref 44–121)
BUN/Creatinine Ratio: 13 (ref 12–28)
BUN: 14 mg/dL (ref 8–27)
Bilirubin Total: 1.6 mg/dL — ABNORMAL HIGH (ref 0.0–1.2)
CO2: 28 mmol/L (ref 20–29)
Calcium: 9.9 mg/dL (ref 8.7–10.3)
Chloride: 100 mmol/L (ref 96–106)
Creatinine, Ser: 1.05 mg/dL — ABNORMAL HIGH (ref 0.57–1.00)
Globulin, Total: 3 g/dL (ref 1.5–4.5)
Glucose: 88 mg/dL (ref 70–99)
Potassium: 4.1 mmol/L (ref 3.5–5.2)
Sodium: 142 mmol/L (ref 134–144)
Total Protein: 7.2 g/dL (ref 6.0–8.5)
eGFR: 59 mL/min/{1.73_m2} — ABNORMAL LOW (ref >59)

## 2023-08-01 NOTE — Telephone Encounter (Signed)
Pt is scheduled 10/18 at 9:20am. Med rec and consent done

## 2023-08-01 NOTE — Telephone Encounter (Signed)
Pt is scheduled 10/18 at 9:20am. Med rec and consent done    Patient Consent for Virtual Visit        Alison Ward has provided verbal consent on 08/01/2023 for a virtual visit (video or telephone).   CONSENT FOR VIRTUAL VISIT FOR:  Alison Ward  By participating in this virtual visit I agree to the following:  I hereby voluntarily request, consent and authorize [facility] and its employed or contracted physicians, physician assistants, nurse practitioners or other licensed health care professionals (the Practitioner), to provide me with telemedicine health care services (the "Services") as deemed necessary by the treating Practitioner. I acknowledge and consent to receive the Services by the Practitioner via telemedicine. I understand that the telemedicine visit will involve communicating with the Practitioner through live audiovisual communication technology and the disclosure of certain medical information by electronic transmission. I acknowledge that I have been given the opportunity to request an in-person assessment or other available alternative prior to the telemedicine visit and am voluntarily participating in the telemedicine visit.  I understand that I have the right to withhold or withdraw my consent to the use of telemedicine in the course of my care at any time, without affecting my right to future care or treatment, and that the Practitioner or I may terminate the telemedicine visit at any time. I understand that I have the right to inspect all information obtained and/or recorded in the course of the telemedicine visit and may receive copies of available information for a reasonable fee.  I understand that some of the potential risks of receiving the Services via telemedicine include:  Delay or interruption in medical evaluation due to technological equipment failure or disruption; Information transmitted may not be sufficient (e.g. poor resolution  of images) to allow for appropriate medical decision making by the Practitioner; and/or  In rare instances, security protocols could fail, causing a breach of personal health information.  Furthermore, I acknowledge that it is my responsibility to provide information about my medical history, conditions and care that is complete and accurate to the best of my ability. I acknowledge that Practitioner's advice, recommendations, and/or decision may be based on factors not within their control, such as incomplete or inaccurate data provided by me or distortions of diagnostic images or specimens that may result from electronic transmissions. I understand that the practice of medicine is not an exact science and that Practitioner makes no warranties or guarantees regarding treatment outcomes. I acknowledge that a copy of this consent can be made available to me via my patient portal [facility] MyChart), or I can request a printed copy by calling the office of [facility].    I understand that my insurance will be billed for this visit.   I have read or had this consent read to me. I understand the contents of this consent, which adequately explains the benefits and risks of the Services being provided via telemedicine.  I have been provided ample opportunity to ask questions regarding this consent and the Services and have had my questions answered to my satisfaction. I give my informed consent for the services to be provided through the use of telemedicine in my medical care

## 2023-08-03 ENCOUNTER — Encounter: Payer: Self-pay | Admitting: Family Medicine

## 2023-08-17 ENCOUNTER — Ambulatory Visit: Payer: Medicaid Other | Attending: Physician Assistant

## 2023-08-17 DIAGNOSIS — Z01810 Encounter for preprocedural cardiovascular examination: Secondary | ICD-10-CM | POA: Diagnosis not present

## 2023-08-17 NOTE — Progress Notes (Signed)
Virtual Visit via Telephone Note   Because of Alison Ward's co-morbid illnesses, she is at least at moderate risk for complications without adequate follow up.  This format is felt to be most appropriate for this patient at this time.  The patient did not have access to video technology/had technical difficulties with video requiring transitioning to audio format only (telephone).  All issues noted in this document were discussed and addressed.  No physical exam could be performed with this format.  Please refer to the patient's chart for her consent to telehealth for [facility].  Evaluation Performed:  Preoperative cardiovascular risk assessment _____________   Date:  08/17/2023   Patient ID:  Alison Ward, DOB 06-23-1961, MRN [id number] Patient Location:  Home Provider location:   Office  Primary Care Provider:  Hoy Register, MD Primary Cardiologist:  Kristeen Miss, MD  Chief Complaint / Patient Profile   62 y.o. y/o female with a h/o mitral regurgitation changed, HTN, hemorrhagic stroke, HLD who is pending left total knee and presents today for telephonic preoperative cardiovascular risk assessment.  History of Present Illness    Alison Ward is a 62 y.o. female who presents via audio/video conferencing for a telehealth visit today.  Pt was last seen in cardiology clinic on 05/15/23 by Dr. Elease Hashimoto.  At that time Berlynn Padget was doing well .  The patient is now pending procedure as outlined above. Since her last visit, she tells me she has been doing pretty good from a heart standpoint.  No chest pain or shortness of breath.  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  She remains very active taking care of her 7 grandkids and 1 great grandchild.  I congratulated her on her beautiful family.  She is able to obtain greater than 4 METS on the DASI.   Regarding ASA therapy, it may be stopped 5 days prior to surgery with a plan to  resume it as soon as felt to be feasible from a surgical standpoint in the post-operative period.   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/22/15  On Keppra currently - last seizure 08-2018 x 2 small issues    Stroke (HCC) 07/05/2013   Past Surgical History:  Procedure Laterality Date   ABDOMINAL HYSTERECTOMY N/A 12/15/2015   Procedure: HYSTERECTOMY ABDOMINAL;  Surgeon: Allie Bossier, MD;  Location: [facility];  Service: Gynecology;  Laterality: N/A;   CESAREAN SECTION     ceserean section     COLONOSCOPY     POLYPECTOMY     SALPINGOOPHORECTOMY Bilateral 12/15/2015   Procedure: SALPINGO OOPHORECTOMY;  Surgeon: Allie Bossier, MD;  Location: [facility];  Service: Gynecology;  Laterality: Bilateral;   VENTRICULOSTOMY Left 07/07/2013   Procedure: VENTRICULOSTOMY;  Surgeon: Cristi Loron, MD;  Location: [facility];  Service: Neurosurgery;  Laterality: Left;  Left Ventriculostomy and Removal of Right Vetriculostomy    Allergies  Allergies  Allergen Reactions   Elavil [Amitriptyline] Other (See Comments)    DIZZINESS   Misc. Sulfonamide Containing Compounds Other (See Comments)    Suicidal Thoughts- Trokendi   Topiramate Er Other (See Comments)    Suicidal Thoughts- Trokendi    Home Medications    Prior to Admission medications   Medication  Sig Start Date End Date Taking? Authorizing Provider  aspirin EC 81 MG tablet Take 1 tablet (81 mg total) by mouth daily. 01/15/17   Quentin Angst, MD  atorvastatin (LIPITOR) 40 MG tablet Take 1 tablet (40 mg total) by mouth daily. 07/31/23   Hoy Register, MD  gabapentin (NEURONTIN) 600 MG tablet Take 1 tablet (600 mg total) by mouth 3 (three) times daily. 01/24/23   Hoy Register, MD  levETIRAcetam (KEPPRA XR) 500 MG 24 hr tablet Take 1 tablet (500 mg total) by  mouth at bedtime. 11/29/22   Ihor Austin, NP  lidocaine (LIDODERM) 5 % Place 1 patch onto the skin daily. Remove & Discard patch within 12 hours or as directed by MD 05/23/23   Storm Frisk, MD  lisinopril-hydrochlorothiazide (ZESTORETIC) 20-25 MG tablet Take 1 tablet by mouth daily. 07/31/23   Hoy Register, MD    Physical Exam    Vital Signs:  Kyanah Felan does not have vital signs available for review today.  Given telephonic nature of communication, physical exam is limited. AAOx3. NAD. Normal affect.  Speech and respirations are unlabored.  Accessory Clinical Findings    None  Assessment & Plan    1.  Preoperative Cardiovascular Risk Assessment:  Ms. Arceo perioperative risk of a major cardiac event is 0.9% according to the Revised Cardiac Risk Index (RCRI).  Therefore, she is at low risk for perioperative complications.   Her functional capacity is good at 5.07 METs according to the [facility] Activity Status Index (DASI). Recommendations: According to ACC/AHA guidelines, no further cardiovascular testing needed.  The patient may proceed to surgery at acceptable risk.   Antiplatelet and/or Anticoagulation Recommendations: Aspirin can be held for 5 days prior to her surgery.  Please resume Aspirin post operatively when it is felt to be safe from a bleeding standpoint.    The patient was advised that if she develops new symptoms prior to surgery to contact our office to arrange for a follow-up visit, and she verbalized understanding.   A copy of this note will be routed to requesting surgeon.  Time:   Today, I have spent 5 minutes with the patient with telehealth technology discussing medical history, symptoms, and management plan.     Sharlene Dory, PA-C  08/17/2023, 7:41 AM

## 2023-08-20 ENCOUNTER — Telehealth: Payer: Self-pay | Admitting: Orthopaedic Surgery

## 2023-08-20 NOTE — Telephone Encounter (Signed)
Called patient to let her know we received the cardiac clearance from Dr. Elease Hashimoto.  Provided my name and direct number to call and scheduled left total knee arthroplasty with Dr. Roda Shutters.

## 2023-09-10 ENCOUNTER — Other Ambulatory Visit: Payer: Self-pay | Admitting: Family Medicine

## 2023-09-10 ENCOUNTER — Other Ambulatory Visit: Payer: Self-pay | Admitting: Adult Health

## 2023-09-10 DIAGNOSIS — G5712 Meralgia paresthetica, left lower limb: Secondary | ICD-10-CM

## 2023-09-10 DIAGNOSIS — G40109 Localization-related (focal) (partial) symptomatic epilepsy and epileptic syndromes with simple partial seizures, not intractable, without status epilepticus: Secondary | ICD-10-CM

## 2023-09-10 DIAGNOSIS — I619 Nontraumatic intracerebral hemorrhage, unspecified: Secondary | ICD-10-CM

## 2023-09-11 NOTE — Telephone Encounter (Signed)
Requested Prescriptions  Pending Prescriptions Disp Refills   gabapentin (NEURONTIN) 600 MG tablet [Pharmacy Med Name: GABAPENTIN 600 MG TABLET] 270 tablet 0    Sig: TAKE 1 TABLET BY MOUTH THREE TIMES A DAY     Neurology: Anticonvulsants - gabapentin Failed - 09/10/2023  9:58 AM      Failed - Cr in normal range and within 360 days    Creat  Date Value Ref Range Status  12/06/2016 1.02 0.50 - 1.05 mg/dL Final    Comment:      For patients > or = 64 years of age: The upper reference limit for Creatinine is approximately 13% higher for people identified as African-American.      Creatinine, Ser  Date Value Ref Range Status  07/31/2023 1.05 (H) 0.57 - 1.00 mg/dL Final         Passed - Completed PHQ-2 or PHQ-9 in the last 360 days      Passed - Valid encounter within last 12 months    Recent Outpatient Visits           1 month ago Prediabetes   [facility] [place]. [facility] Hoy Register, MD   3 months ago Primary osteoarthritis of left knee   [facility] [place]. [facility] Storm Frisk, MD   7 months ago Essential hypertension, benign   [facility] Dept Of [place]. [facility] Hoy Register, MD   1 year ago Chronic right shoulder pain   [facility] [place]. [facility] Hoy Register, MD   1 year ago Encounter for screening mammogram for malignant neoplasm of breast    [facility] [place]. [facility] Hoy Register, MD       Future Appointments             In 4 months Hoy Register, MD [facility] [place] - A Dept Of . [facility]

## 2023-10-04 ENCOUNTER — Ambulatory Visit: Payer: Medicaid Other

## 2023-10-15 ENCOUNTER — Other Ambulatory Visit: Payer: Self-pay | Admitting: Family Medicine

## 2023-10-15 DIAGNOSIS — Z1231 Encounter for screening mammogram for malignant neoplasm of breast: Secondary | ICD-10-CM

## 2023-10-18 ENCOUNTER — Ambulatory Visit: Payer: Medicaid Other

## 2023-11-08 ENCOUNTER — Ambulatory Visit: Payer: Medicare Other

## 2023-11-23 ENCOUNTER — Ambulatory Visit
Admission: RE | Admit: 2023-11-23 | Discharge: 2023-11-23 | Disposition: A | Discharge status: Home / Self Care | Payer: Medicare Other | Source: Ambulatory Visit | Attending: Family Medicine | Admitting: Family Medicine

## 2023-11-23 DIAGNOSIS — Z1231 Encounter for screening mammogram for malignant neoplasm of breast: Secondary | ICD-10-CM

## 2023-11-27 ENCOUNTER — Encounter: Payer: Self-pay | Admitting: Family Medicine

## 2023-12-03 ENCOUNTER — Ambulatory Visit: Payer: Medicaid Other | Admitting: Adult Health

## 2024-01-01 ENCOUNTER — Other Ambulatory Visit: Payer: Self-pay | Admitting: Adult Health

## 2024-01-01 DIAGNOSIS — G40109 Localization-related (focal) (partial) symptomatic epilepsy and epileptic syndromes with simple partial seizures, not intractable, without status epilepticus: Secondary | ICD-10-CM

## 2024-01-01 DIAGNOSIS — I619 Nontraumatic intracerebral hemorrhage, unspecified: Secondary | ICD-10-CM

## 2024-01-01 NOTE — Telephone Encounter (Signed)
 Refilled per last appointment note, "Denies any seizure activity, compliant on Keppra XR 500 mg daily."  Last appointment: 11/29/2022 Upcoming appointment: 01/29/2024

## 2024-01-29 ENCOUNTER — Encounter: Payer: Self-pay | Admitting: Family Medicine

## 2024-01-29 ENCOUNTER — Ambulatory Visit: Payer: Medicaid Other | Attending: Family Medicine | Admitting: Family Medicine

## 2024-01-29 ENCOUNTER — Encounter: Payer: Self-pay | Admitting: Adult Health

## 2024-01-29 ENCOUNTER — Ambulatory Visit: Payer: Medicaid Other | Admitting: Adult Health

## 2024-01-29 VITALS — BP 136/85 | HR 73 | Temp 97.9°F | Ht 68.0 in | Wt 243.0 lb

## 2024-01-29 DIAGNOSIS — E2839 Other primary ovarian failure: Secondary | ICD-10-CM | POA: Insufficient documentation

## 2024-01-29 DIAGNOSIS — R7303 Prediabetes: Secondary | ICD-10-CM | POA: Insufficient documentation

## 2024-01-29 DIAGNOSIS — M170 Bilateral primary osteoarthritis of knee: Secondary | ICD-10-CM | POA: Diagnosis not present

## 2024-01-29 DIAGNOSIS — Z6836 Body mass index (BMI) 36.0-36.9, adult: Secondary | ICD-10-CM | POA: Insufficient documentation

## 2024-01-29 DIAGNOSIS — E785 Hyperlipidemia, unspecified: Secondary | ICD-10-CM | POA: Diagnosis not present

## 2024-01-29 DIAGNOSIS — R251 Tremor, unspecified: Secondary | ICD-10-CM | POA: Insufficient documentation

## 2024-01-29 DIAGNOSIS — G5712 Meralgia paresthetica, left lower limb: Secondary | ICD-10-CM | POA: Insufficient documentation

## 2024-01-29 DIAGNOSIS — G40109 Localization-related (focal) (partial) symptomatic epilepsy and epileptic syndromes with simple partial seizures, not intractable, without status epilepticus: Secondary | ICD-10-CM | POA: Diagnosis not present

## 2024-01-29 DIAGNOSIS — I10 Essential (primary) hypertension: Secondary | ICD-10-CM | POA: Diagnosis present

## 2024-01-29 DIAGNOSIS — Z79899 Other long term (current) drug therapy: Secondary | ICD-10-CM | POA: Diagnosis not present

## 2024-01-29 DIAGNOSIS — E7800 Pure hypercholesterolemia, unspecified: Secondary | ICD-10-CM

## 2024-01-29 DIAGNOSIS — Z760 Encounter for issue of repeat prescription: Secondary | ICD-10-CM | POA: Insufficient documentation

## 2024-01-29 DIAGNOSIS — Z8673 Personal history of transient ischemic attack (TIA), and cerebral infarction without residual deficits: Secondary | ICD-10-CM | POA: Diagnosis not present

## 2024-01-29 DIAGNOSIS — J00 Acute nasopharyngitis [common cold]: Secondary | ICD-10-CM

## 2024-01-29 MED ORDER — LISINOPRIL-HYDROCHLOROTHIAZIDE 20-25 MG PO TABS: 1.0000 | ORAL_TABLET | Freq: Every day | ORAL | 1 refills | Status: DC

## 2024-01-29 MED ORDER — GABAPENTIN 600 MG PO TABS: 600.0000 mg | ORAL_TABLET | Freq: Three times a day (TID) | ORAL | 0 refills | Status: DC

## 2024-01-29 MED ORDER — ATORVASTATIN CALCIUM 40 MG PO TABS: 40.0000 mg | ORAL_TABLET | Freq: Every day | ORAL | 1 refills | Status: DC

## 2024-01-29 NOTE — Progress Notes (Signed)
 Subjective:  Patient ID: Alison Ward, female    DOB: September 02, 1961  Age: 63 y.o. MRN: [id number]  CC: Hypertension (HTN f/u. Med refill./Blanching spots increasing on limbs /No to pneumonia vax)     Discussed the use of AI scribe software for clinical note transcription with the patient, who gave verbal consent to proceed.  History of Present Illness The patient, with a history of hypertension, chronic headaches, previous history of right  Hemorrhagic stroke status post ventriculostomy in 06/2013, Prediabetes (A1c 5.9), meralgia paresthetica, hyperlipidemia presents with a cold and a dry cough, which started after exposure to her great-granddaughter. The patient has been using over-the-counter cold medicine, specifically Chloricidin, which is safe for individuals with high blood pressure. The patient also noticed white spots on her skin, which are scattered on her arms and legs.  The patient's tremors, which were previously disabling, have improved significantly. The patient reports being able to journal again and handle cups without spilling. The tremors are still present but are less severe and are more noticeable when the patient is tired.  The patient also mentions a history of knee pain, which has improved with regular stair climbing. The patient reports no issues with walking or standing for extended periods. The patient also has a history of high cholesterol, which is being managed with atorvastatin.  Doses adherence with her antihypertensive. She also continues to take gabapentin due to meralgia paresthetica of her left thigh which is intermittent.    Past Medical History:  Diagnosis Date   Chronic headaches    daily, bitemporal, associated with tingling in face   Heart murmur    Hypertension    Seizures (HCC)    partial seizures last one on 10/22/15  On Keppra currently - last seizure 08-2018 x 2 small issues    Stroke (HCC) 07/05/2013    Past Surgical History:   Procedure Laterality Date   ABDOMINAL HYSTERECTOMY N/A 12/15/2015   Procedure: HYSTERECTOMY ABDOMINAL;  Surgeon: Allie Bossier, MD;  Location: [facility];  Service: Gynecology;  Laterality: N/A;   CESAREAN SECTION     ceserean section     COLONOSCOPY     POLYPECTOMY     SALPINGOOPHORECTOMY Bilateral 12/15/2015   Procedure: SALPINGO OOPHORECTOMY;  Surgeon: Allie Bossier, MD;  Location: [facility];  Service: Gynecology;  Laterality: Bilateral;   VENTRICULOSTOMY Left 07/07/2013   Procedure: VENTRICULOSTOMY;  Surgeon: Cristi Loron, MD;  Location: [facility];  Service: Neurosurgery;  Laterality: Left;  Left Ventriculostomy and Removal of Right Vetriculostomy    Family History  Problem Relation Age of Onset   Hypertension Mother    Diabetes Mother    Stroke Mother    Hypertension Father    Colon cancer Neg Hx    Breast cancer Neg Hx    Colon polyps Neg Hx    Esophageal cancer Neg Hx    Rectal cancer Neg Hx    Stomach cancer Neg Hx     Social History   Socioeconomic History   Marital status: Divorced    Spouse name: Not on file   Number of children: 3   Years of education: college   Highest education level: Not on file  Occupational History   Occupation: [employer]  Tobacco Use   Smoking status: Never   Smokeless tobacco: Never  Vaping Use   Vaping status: Never Used  Substance and Sexual Activity   Alcohol use: No   Drug use: No   Sexual activity: Not  Currently    Birth control/protection: Surgical  Other Topics Concern   Not on file  Social History Narrative   Not on file   Social Drivers of Health   Financial Resource Strain: Low Risk  (01/29/2024)   Overall Financial Resource Strain (CARDIA)    Difficulty of Paying Living Expenses: Not very hard  Food Insecurity: No Food Insecurity (01/29/2024)   Hunger Vital Sign    Worried About Running Out of Food in the Last Year: Never true    Ran Out of Food in the Last Year: Never true  Transportation Needs: No  Transportation Needs (01/29/2024)   PRAPARE - Administrator, Civil Service (Medical): No    Lack of Transportation (Non-Medical): No  Physical Activity: Insufficiently Active (01/29/2024)   Exercise Vital Sign    Days of Exercise per Week: 3 days    Minutes of Exercise per Session: 20 min  Stress: No Stress Concern Present (01/29/2024)   [employer] of Occupational Health - Occupational Stress Questionnaire    Feeling of Stress : Not at all  Social Connections: Moderately Integrated (01/29/2024)   Social Connection and Isolation Panel [NHANES]    Frequency of Communication with Friends and Family: Twice a week    Frequency of Social Gatherings with Friends and Family: Twice a week    Attends Religious Services: More than 4 times per year    Active Member of [employer] or Organizations: Yes    Attends Engineer, structural: More than 4 times per year    Marital Status: Never married    Allergies  Allergen Reactions   Elavil [Amitriptyline] Other (See Comments)    DIZZINESS   Misc. Sulfonamide Containing Compounds Other (See Comments)    Suicidal Thoughts- Trokendi   Topiramate Er Other (See Comments)    Suicidal Thoughts- Trokendi    Outpatient Medications Prior to Visit  Medication Sig Dispense Refill   aspirin EC 81 MG tablet Take 1 tablet (81 mg total) by mouth daily. 30 tablet 1   atorvastatin (LIPITOR) 40 MG tablet Take 1 tablet (40 mg total) by mouth daily. 90 tablet 1   gabapentin (NEURONTIN) 600 MG tablet TAKE 1 TABLET BY MOUTH THREE TIMES A DAY 270 tablet 0   levETIRAcetam (KEPPRA XR) 500 MG 24 hr tablet TAKE 1 TABLET BY MOUTH AT BEDTIME. 90 tablet 0   lidocaine (LIDODERM) 5 % Place 1 patch onto the skin daily. Remove & Discard patch within 12 hours or as directed by MD 30 patch 0   lisinopril-hydrochlorothiazide (ZESTORETIC) 20-25 MG tablet Take 1 tablet by mouth daily. 90 tablet 1   No facility-administered medications prior to visit.     ROS Review  of Systems  Constitutional:  Negative for activity change and appetite change.  HENT:  Negative for sinus pressure and sore throat.   Respiratory:  Negative for chest tightness, shortness of breath and wheezing.   Cardiovascular:  Negative for chest pain and palpitations.  Gastrointestinal:  Negative for abdominal distention, abdominal pain and constipation.  Genitourinary: Negative.   Musculoskeletal: Negative.   Neurological:  Positive for tremors.  Psychiatric/Behavioral:  Negative for behavioral problems and dysphoric mood.     Objective:  BP 136/85 (BP Location: Left Arm, Patient Position: Sitting, Cuff Size: Normal)   Pulse 73   Temp 97.9 F (36.6 C) (Oral)   Ht 5\' 8"  (1.727 m)   Wt 243 lb (110.2 kg)   LMP 11/24/2011   SpO2 98%   BMI  36.95 kg/m      01/29/2024   10:36 AM 07/31/2023   10:25 AM 05/23/2023   10:40 AM  BP/Weight  Systolic BP 136 100 113  Diastolic BP 85 67 76  Wt. (Lbs) 243 245.4 243.6  BMI 36.95 kg/m2 37.31 kg/m2 37.04 kg/m2      Physical Exam Constitutional:      Appearance: She is well-developed. She is obese.  Cardiovascular:     Rate and Rhythm: Normal rate.     Heart sounds: Murmur heard.  Pulmonary:     Effort: Pulmonary effort is normal.     Breath sounds: Normal breath sounds. No wheezing or rales.  Chest:     Chest wall: No tenderness.  Abdominal:     General: Bowel sounds are normal. There is no distension.     Palpations: Abdomen is soft. There is no mass.     Tenderness: There is no abdominal tenderness.  Musculoskeletal:        General: Normal range of motion.     Right lower leg: No edema.     Left lower leg: No edema.  Neurological:     Mental Status: She is alert and oriented to person, place, and time.     Coordination: Coordination abnormal (tremors in hands).  Psychiatric:        Mood and Affect: Mood normal.        Latest Ref Rng & Units 07/31/2023   11:06 AM 03/13/2023    9:55 AM 08/01/2022   10:16 AM  CMP   Glucose 70 - 99 mg/dL 88  79  83   BUN 8 - 27 mg/dL 14  12  14    Creatinine 0.57 - 1.00 mg/dL 5.62  1.30  8.65   Sodium 134 - 144 mmol/L 142  143  141   Potassium 3.5 - 5.2 mmol/L 4.1  4.1  4.1   Chloride 96 - 106 mmol/L 100  103  99   CO2 20 - 29 mmol/L 28  27  29    Calcium 8.7 - 10.3 mg/dL 9.9  9.8  9.8   Total Protein 6.0 - 8.5 g/dL 7.2  7.0  7.3   Total Bilirubin 0.0 - 1.2 mg/dL 1.6  1.4  0.8   Alkaline Phos 44 - 121 IU/L 62  59  63   AST 0 - 40 IU/L 16  17  16    ALT 0 - 32 IU/L 10  12  11      Lipid Panel     Component Value Date/Time   CHOL 147 03/13/2023 0955   TRIG 131 03/13/2023 0955   HDL 43 03/13/2023 0955   CHOLHDL 3.7 01/20/2020 0841   CHOLHDL 6.4 (H) 12/06/2016 1012   VLDL 35 (H) 12/06/2016 1012   LDLCALC 81 03/13/2023 0955    CBC    Component Value Date/Time   WBC 3.4 03/13/2023 0955   WBC 4.0 12/06/2016 1012   RBC 4.36 03/13/2023 0955   RBC 4.59 12/06/2016 1012   HGB 12.4 03/13/2023 0955   HCT 38.9 03/13/2023 0955   PLT 216 03/13/2023 0955   MCV 89 03/13/2023 0955   MCH 28.4 03/13/2023 0955   MCH 27.9 12/06/2016 1012   MCHC 31.9 03/13/2023 0955   MCHC 32.2 12/06/2016 1012   RDW 14.5 03/13/2023 0955   LYMPHSABS 1.8 03/13/2023 0955   MONOABS 400 12/06/2016 1012   EOSABS 0.1 03/13/2023 0955   BASOSABS 0.0 03/13/2023 0955    Lab Results  Component Value  Date   HGBA1C 5.9 (H) 03/13/2023       Assessment & Plan Common Cold   Symptoms began on Saturday, including a dry cough. She is concerned about over-the-counter medications due to hypertension. Chloricidin is confirmed as safe as it does not contain Sudafedrin, which can elevate blood pressure.   - Advise Chloricidin for cold symptoms   Hypertension   She is cautious about medications that could elevate blood pressure  - Continue lisinopril-hydrochlorothiazide for blood pressure management.  -Counseled on blood pressure goal of less than 130/80, low-sodium, DASH diet, medication compliance,  150 minutes of moderate intensity exercise per week. Discussed medication compliance, adverse effects.    Tremors   Tremors have improved, allowing her to journal and perform daily activities with less difficulty. They are more noticeable when tired.    Hyperlipidemia   She is on atorvastatin with normal cholesterol levels as of May. Kidney and liver function tests will be conducted.   - Continue atorvastatin for cholesterol management.   - Conduct kidney and liver function tests.    Knee Pain/ OA of the knees Knee pain worsens in cold weather. She manages it with lifestyle adjustments, such as avoiding low chairs and using stairs regularly.   - Encourage continued lifestyle modifications.   -Seen by orthopedics  Simple partial seizures/history of hemorrhagic CVA -Remains on Depakote Managed by neurology  Morbid obesity -She is continuing to work on weight loss by means of exercising and taking the stairs  Prediabetes Labs reveal prediabetes with an A1c of 5.9.  An A1c of 6.5 is supportive of a diagnosis of type 2 diabetes mellitus.  Working on a low carbohydrate diet, exercise, weight loss is recommended in order to prevent progression to type 2 diabetes mellitus.   Meralgia paresthetica -Doing well on gabapentin   General Health Maintenance   She is due for a bone density test to check for osteoporosis as part of routine health maintenance for women aged 51 and above.   - Order bone density test at the breast center.    Follow-up   She will be contacted via MyChart with test results and advised to schedule an appointment if issues arise before the next routine visit.   - Send medication ref[facility] [address].   - Contact via MyChart with test results.   - Advise to schedule an appointment if any issues arise before the next routine visit.      No orders of the defined types were placed in this encounter.   Follow-up: No follow-ups on file.        Hoy Register, MD, FAAFP. [facility] [place], [place] [phone]   01/29/2024, 11:06 AM

## 2024-01-29 NOTE — Progress Notes (Deleted)
 [facility] [address] [place]. [place] [address] [phone]       OFFICE FOLLOW UP NOTE  [name] Date of Birth:  Jan 31, 1961 Medical Record Number:  [id number]     Primary neurologist: Dr. Pearlean Brownie Reason for visit: hx of ICH and seizures   CHIEF COMPLAINT:  No chief complaint on file.   Follow-up visit:  Prior visit: 11/29/2022  Brief HPI:  Alison Ward is a 63 y.o. female with hx of right parietal temporal ICH with intraventricular extension s/p right ventriculostomy catheter in 2015 with residual headaches well-controlled on gabapentin.  Episode of near syncope and intermittent left-sided paresthesias in 2016 and started on Keppra for suspected partial seizures.  Reported recurrent left-sided paresthesias and presyncopal episodes in 2023 while in [place] caring for her mother, CTH no acute abnormalities, recommended increasing Keppra XR from 500mg  to 1000mg  daily but never increased dose and no additional events since that time.    Interval history:       Update 01/29/2024 JM:  Update 05/23/2022 JM: Patient returns for follow-up after prior visit 4 months ago unaccompanied.  She was reporting transient episodes of left-sided paresthesias as well as left-sided headache as well as sensation of dizziness/imbalance.  Completed CT head which did not show any acute findings or new findings compared to prior imaging.  Recommended increasing Keppra XR dosage to 1000 mg daily but currently has only been taking 500 mg, will take additional 500mg  tablet if not feeling well but has not had to use recently.  She has not had any additional episodes left-sided paresthesias or dizziness/lightheadedness symptoms.  She will have occasional headaches with weather changes but otherwise stable.  She continues to travel back and forth from here to [place] caring for her mother and plans on returning in August.  Update 01/16/2022 JM: 63 year old  female with history of stroke, seizure disorder and chronic headaches.  She returns today for follow-up visit and medication management.  Prior visit almost 2 years ago as she has spent a prolonged period of time in the [place] visiting family. Over the past couple months, she has been caring for her ill mother in [place] and has started feeling left sided headaches and left sided paresthesias . Was not feeling these while in [place] which she believes is because she was resting and relaxing.  She also reports dizziness and imbalance like she may pass out but thankfully has not had any loss of consciousness.  Reports compliance on Keppra XR 500 mg daily and at times will take an extra dose of keppra if feeling like she may pass out. She will check BP which has been normal at those times. She has remained on gabapentin 600 mg 3 times daily with benefit of typical headaches. Her current headaches feel different than her normal headaches.  Otherwise, denies new stroke/TIA symptoms such as visual changes, weakness, speech difficulty or altered mental status        ROS:   14 system review of systems performed and negative with exception of those listed in HPI  PMH:  Past Medical History:  Diagnosis Date   Chronic headaches    daily, bitemporal, associated with tingling in face   Heart murmur    Hypertension    Seizures (HCC)    partial seizures last one on 10/22/15  On Keppra currently - last seizure 08-2018 x 2 small issues    Stroke (HCC) 07/05/2013    PSH:  Past Surgical History:  Procedure  Laterality Date   ABDOMINAL HYSTERECTOMY N/A 12/15/2015   Procedure: HYSTERECTOMY ABDOMINAL;  Surgeon: Allie Bossier, MD;  Location: [facility];  Service: Gynecology;  Laterality: N/A;   CESAREAN SECTION     ceserean section     COLONOSCOPY     POLYPECTOMY     SALPINGOOPHORECTOMY Bilateral 12/15/2015   Procedure: SALPINGO OOPHORECTOMY;  Surgeon: Allie Bossier, MD;  Location: [facility];  Service: Gynecology;   Laterality: Bilateral;   VENTRICULOSTOMY Left 07/07/2013   Procedure: VENTRICULOSTOMY;  Surgeon: Cristi Loron, MD;  Location: [facility];  Service: Neurosurgery;  Laterality: Left;  Left Ventriculostomy and Removal of Right Vetriculostomy    Social History:  Social History   Socioeconomic History   Marital status: Divorced    Spouse name: Not on file   Number of children: 3   Years of education: college   Highest education level: Not on file  Occupational History   Occupation: [employer]  Tobacco Use   Smoking status: Never   Smokeless tobacco: Never  Vaping Use   Vaping status: Never Used  Substance and Sexual Activity   Alcohol use: No   Drug use: No   Sexual activity: Not Currently    Birth control/protection: Surgical  Other Topics Concern   Not on file  Social History Narrative   Not on file   Social Drivers of Health   Financial Resource Strain: Not on file  Food Insecurity: Not on file  Transportation Needs: Not on file  Physical Activity: Not on file  Stress: Not on file  Social Connections: Not on file  Intimate Partner Violence: Not on file    Family History:  Family History  Problem Relation Age of Onset   Hypertension Mother    Diabetes Mother    Stroke Mother    Hypertension Father    Colon cancer Neg Hx    Breast cancer Neg Hx    Colon polyps Neg Hx    Esophageal cancer Neg Hx    Rectal cancer Neg Hx    Stomach cancer Neg Hx     Medications:   Current Outpatient Medications on File Prior to Visit  Medication Sig Dispense Refill   aspirin EC 81 MG tablet Take 1 tablet (81 mg total) by mouth daily. 30 tablet 1   atorvastatin (LIPITOR) 40 MG tablet Take 1 tablet (40 mg total) by mouth daily. 90 tablet 1   gabapentin (NEURONTIN) 600 MG tablet TAKE 1 TABLET BY MOUTH THREE TIMES A DAY 270 tablet 0   levETIRAcetam (KEPPRA XR) 500 MG 24 hr tablet TAKE 1 TABLET BY MOUTH AT BEDTIME. 90 tablet 0   lidocaine (LIDODERM) 5 % Place 1 patch  onto the skin daily. Remove & Discard patch within 12 hours or as directed by MD 30 patch 0   lisinopril-hydrochlorothiazide (ZESTORETIC) 20-25 MG tablet Take 1 tablet by mouth daily. 90 tablet 1   No current facility-administered medications on file prior to visit.    Allergies:   Allergies  Allergen Reactions   Elavil [Amitriptyline] Other (See Comments)    DIZZINESS   Misc. Sulfonamide Containing Compounds Other (See Comments)    Suicidal Thoughts- Trokendi   Topiramate Er Other (See Comments)    Suicidal Thoughts- Trokendi    OBJECTIVE:  There were no vitals filed for this visit.     There is no height or weight on file to calculate BMI.   Physical Exam There were no vitals filed for this visit.  There is no height or weight on file to calculate BMI.  General: well developed, well nourished, very pleasant middle aged [place] female, seated, in no evident distress Head: head normocephalic and atraumatic.   Neck: supple with no carotid or supraclavicular bruits Cardiovascular: regular rate and rhythm, no murmurs Skin:  no rash/petichiae Vascular:  Normal pulses all extremities   Neurologic Exam Mental Status: Awake and fully alert. Normal speech and language. Oriented to place and time. Recent and remote memory intact. Attention span, concentration and fund of knowledge appropriate. Mood and affect appropriate.  Cranial Nerves: Pupils equal, briskly reactive to light. Extraocular movements full without nystagmus. Visual fields full to confrontation. Hearing intact. Facial sensation intact. Face, tongue, palate moves normally and symmetrically.  Motor: Normal bulk and tone. Normal strength in all tested extremity muscles. Sensory.: intact to touch , pinprick , position and vibratory sensation.  Coordination: Rapid alternating movements normal in all extremities. Finger-to-nose and heel-to-shin performed accurately bilaterally. Gait and Station: Arises from chair  without difficulty. Stance is normal. Gait demonstrates normal stride length and balance Reflexes: 1+ and symmetric. Toes downgoing.         ASSESSMENT: Alison Ward is a 63 y.o. 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  Remote history of hypertensive right temporal intracerebral hemorrhage in 2014. Worsening/change in headaches and reoccurrence of left sided intermittent paresthesias and pre-syncope symptoms in the beginning of 2023 - CTH negative for acute findings, resolved without intervention (did not increase Keppra as advised). No additional issues since then.     PLAN:  -Continue Keppra XR 500 mg daily for seizure prophylaxis, refill provided -Continue aspirin 81 mg and atorvastatin 40 mg for secondary stroke prevention -Continue to follow with PCP for chronic disease management    Follow-up in 1 year or call earlier if needed     I spent 21 minutes of face-to-face and non-face-to-face time with patient.  This included previsit chart review, lab review, study review, order entry, electronic health record documentation, patient education and discussion regarding the above and answered all the questions to patient's satisfaction   Ihor Austin, [facility]  [facility] [address] [place], [place] [address]  Phone [phone] Fax [phone] Note: This document was prepared with digital dictation and possible smart phrase technology. Any transcriptional errors that result from this process are unintentional.

## 2024-01-29 NOTE — Patient Instructions (Signed)
 VISIT SUMMARY:  During today's visit, we discussed your recent cold symptoms, including a dry cough, and confirmed that Chloricidin is safe for you to use. We also reviewed your ongoing health conditions, including high blood pressure, tremors, high cholesterol, and knee pain. Additionally, we planned for routine health maintenance tests.  YOUR PLAN:  -COMMON COLD: A common cold is a viral infection of your nose and throat. You can continue using Chloricidin for your cold symptoms as it is safe for your high blood pressure.  -HYPERTENSION: Hypertension is high blood pressure. Continue taking your lisinopril-hydrochlorothiazide as prescribed to manage your blood pressure.  -TREMORS: Tremors are involuntary shaking movements. Your tremors have improved, allowing you to perform daily activities more easily. They are more noticeable when you are tired.  -HYPERLIPIDEMIA: Hyperlipidemia is high cholesterol. Continue taking atorvastatin to manage your cholesterol levels. We will conduct kidney and liver function tests to monitor your health.  -KNEE PAIN: Knee pain can be caused by various factors, including arthritis or injury. Continue with your lifestyle adjustments, such as avoiding low chairs and using stairs regularly, to manage your knee pain.  -GENERAL HEALTH MAINTENANCE: Routine health maintenance includes regular check-ups and tests to monitor your overall health. You are due for a bone density test to check for osteoporosis.  INSTRUCTIONS:  We will [facility] [address]. You will be contacted via MyChart with your test results. Please schedule an appointment if any issues arise before your next routine visit.

## 2024-01-30 ENCOUNTER — Encounter: Payer: Self-pay | Admitting: Family Medicine

## 2024-01-30 LAB — CMP14+EGFR
ALT: 15 IU/L (ref 0–32)
AST: 24 IU/L (ref 0–40)
Albumin: 4.2 g/dL (ref 3.9–4.9)
Alkaline Phosphatase: 61 IU/L (ref 44–121)
BUN/Creatinine Ratio: 16 (ref 12–28)
BUN: 14 mg/dL (ref 8–27)
Bilirubin Total: 0.6 mg/dL (ref 0.0–1.2)
CO2: 29 mmol/L (ref 20–29)
Calcium: 9.4 mg/dL (ref 8.7–10.3)
Chloride: 100 mmol/L (ref 96–106)
Creatinine, Ser: 0.86 mg/dL (ref 0.57–1.00)
Globulin, Total: 3 g/dL (ref 1.5–4.5)
Glucose: 78 mg/dL (ref 70–99)
Potassium: 4 mmol/L (ref 3.5–5.2)
Sodium: 142 mmol/L (ref 134–144)
Total Protein: 7.2 g/dL (ref 6.0–8.5)
eGFR: 75 mL/min/{1.73_m2} (ref >59)

## 2024-01-30 LAB — HEMOGLOBIN A1C
Est. average glucose Bld gHb Est-mCnc: 123 mg/dL
Hgb A1c MFr Bld: 5.9 % — ABNORMAL HIGH (ref 4.8–5.6)

## 2024-03-06 ENCOUNTER — Ambulatory Visit: Attending: Family Medicine | Admitting: Family Medicine

## 2024-03-06 ENCOUNTER — Encounter: Payer: Self-pay | Admitting: Family Medicine

## 2024-03-06 VITALS — BP 119/79 | HR 65 | Ht 68.0 in | Wt 242.6 lb

## 2024-03-06 DIAGNOSIS — R590 Localized enlarged lymph nodes: Secondary | ICD-10-CM

## 2024-03-06 DIAGNOSIS — E785 Hyperlipidemia, unspecified: Secondary | ICD-10-CM | POA: Insufficient documentation

## 2024-03-06 DIAGNOSIS — I10 Essential (primary) hypertension: Secondary | ICD-10-CM | POA: Diagnosis not present

## 2024-03-06 DIAGNOSIS — M1712 Unilateral primary osteoarthritis, left knee: Secondary | ICD-10-CM | POA: Diagnosis not present

## 2024-03-06 DIAGNOSIS — Z982 Presence of cerebrospinal fluid drainage device: Secondary | ICD-10-CM | POA: Diagnosis not present

## 2024-03-06 DIAGNOSIS — M170 Bilateral primary osteoarthritis of knee: Secondary | ICD-10-CM | POA: Insufficient documentation

## 2024-03-06 DIAGNOSIS — M25512 Pain in left shoulder: Secondary | ICD-10-CM | POA: Diagnosis not present

## 2024-03-06 DIAGNOSIS — M79602 Pain in left arm: Secondary | ICD-10-CM | POA: Insufficient documentation

## 2024-03-06 DIAGNOSIS — R591 Generalized enlarged lymph nodes: Secondary | ICD-10-CM | POA: Insufficient documentation

## 2024-03-06 DIAGNOSIS — R7303 Prediabetes: Secondary | ICD-10-CM | POA: Diagnosis not present

## 2024-03-06 DIAGNOSIS — M25469 Effusion, unspecified knee: Secondary | ICD-10-CM | POA: Diagnosis not present

## 2024-03-06 DIAGNOSIS — Z8673 Personal history of transient ischemic attack (TIA), and cerebral infarction without residual deficits: Secondary | ICD-10-CM | POA: Insufficient documentation

## 2024-03-06 MED ORDER — DICLOFENAC SODIUM 1 % EX GEL: 4.0000 g | Freq: Four times a day (QID) | CUTANEOUS | 1 refills | Status: DC

## 2024-03-06 MED ORDER — LIDOCAINE 5 % EX PTCH: 1.0000 | MEDICATED_PATCH | CUTANEOUS | 1 refills | Status: DC

## 2024-03-06 MED ORDER — MISC. DEVICES MISC: 0 refills | Status: DC

## 2024-03-06 NOTE — Progress Notes (Signed)
 Subjective:  Patient ID: Alison Ward, female    DOB: 1961-06-14  Age: 64 y.o. MRN: [id number]  CC: Cyst (Knot on left side of face/Left shoulder pain)     Discussed the use of AI scribe software for clinical note transcription with the patient, who gave verbal consent to proceed.  History of Present Illness Alison Ward is a 63 year old female with a history of hypertension, chronic headaches, previous history of right  Hemorrhagic stroke status post ventriculostomy in 06/2013, Prediabetes, meralgia paresthetica, hyperlipidemia who presents with a swollen lymph node and left arm pain.  She experienced a tickling sensation in her throat on Friday evening, which progressed to a knot near her left ear by Saturday. The swelling, initially larger and painful to touch, has reduced in size and now feels like a marble. She has been taking medication since Saturday, which has alleviated symptoms. No runny nose, coughing, pain, or fever after starting medication, though she had a dry cough on Saturday morning.  Left arm pain is attributed to using her arm on the wall for support when getting up from a low toilet. The pain occurs with certain movements, particularly when trying to support herself. She uses Voltaren gel and Lidoderm  patches for pain relief, applying the gel to her shoulder or knee and the patches to her hip. She has received pain pads from friends at church.  She has significant difficulty with her left knee, describing it as bone on bone, impacting her mobility and making it difficult to climb stairs or get up from low seating. She missed a family event due to her knee swelling.  She has hypertension and is cautious about taking her blood pressure medication when going out to avoid frequent bathroom use. She relies on her daughter or grandson for transportation to pick up prescriptions, as she does not drive.    Past Medical History:  Diagnosis Date   Chronic  headaches    daily, bitemporal, associated with tingling in face   Heart murmur    Hypertension    Seizures (HCC)    partial seizures last one on 10/22/15  On Keppra  currently - last seizure 08-2018 x 2 small issues    Stroke (HCC) 07/05/2013    Past Surgical History:  Procedure Laterality Date   ABDOMINAL HYSTERECTOMY N/A 12/15/2015   Procedure: HYSTERECTOMY ABDOMINAL;  Surgeon: Ana Balling, MD;  Location: [facility];  Service: Gynecology;  Laterality: N/A;   CESAREAN SECTION     ceserean section     COLONOSCOPY     POLYPECTOMY     SALPINGOOPHORECTOMY Bilateral 12/15/2015   Procedure: SALPINGO OOPHORECTOMY;  Surgeon: Ana Balling, MD;  Location: [facility];  Service: Gynecology;  Laterality: Bilateral;   VENTRICULOSTOMY Left 07/07/2013   Procedure: VENTRICULOSTOMY;  Surgeon: Elder Greening, MD;  Location: [facility];  Service: Neurosurgery;  Laterality: Left;  Left Ventriculostomy and Removal of Right Vetriculostomy    Family History  Problem Relation Age of Onset   Hypertension Mother    Diabetes Mother    Stroke Mother    Hypertension Father    Colon cancer Neg Hx    Breast cancer Neg Hx    Colon polyps Neg Hx    Esophageal cancer Neg Hx    Rectal cancer Neg Hx    Stomach cancer Neg Hx     Social History   Socioeconomic History   Marital status: Divorced    Spouse name: Not on file  Number of children: 3   Years of education: college   Highest education level: Not on file  Occupational History   Occupation: [employer]  Tobacco Use   Smoking status: Never   Smokeless tobacco: Never  Vaping Use   Vaping status: Never Used  Substance and Sexual Activity   Alcohol use: No   Drug use: No   Sexual activity: Not Currently    Birth control/protection: Surgical  Other Topics Concern   Not on file  Social History Narrative   Not on file   Social Drivers of Health   Financial Resource Strain: Low Risk  (01/29/2024)   Overall Financial Resource Strain (CARDIA)     Difficulty of Paying Living Expenses: Not very hard  Food Insecurity: No Food Insecurity (01/29/2024)   Hunger Vital Sign    Worried About Running Out of Food in the Last Year: Never true    Ran Out of Food in the Last Year: Never true  Transportation Needs: No Transportation Needs (01/29/2024)   PRAPARE - Administrator, Civil Service (Medical): No    Lack of Transportation (Non-Medical): No  Physical Activity: Insufficiently Active (01/29/2024)   Exercise Vital Sign    Days of Exercise per Week: 3 days    Minutes of Exercise per Session: 20 min  Stress: No Stress Concern Present (01/29/2024)   [employer] of Occupational Health - Occupational Stress Questionnaire    Feeling of Stress : Not at all  Social Connections: Moderately Integrated (01/29/2024)   Social Connection and Isolation Panel [NHANES]    Frequency of Communication with Friends and Family: Twice a week    Frequency of Social Gatherings with Friends and Family: Twice a week    Attends Religious Services: More than 4 times per year    Active Member of [employer] or Organizations: Yes    Attends Engineer, structural: More than 4 times per year    Marital Status: Never married    Allergies  Allergen Reactions   Elavil  [Amitriptyline ] Other (See Comments)    DIZZINESS   Misc. Sulfonamide Containing Compounds Other (See Comments)    Suicidal Thoughts- Trokendi    Topiramate  Er Other (See Comments)    Suicidal Thoughts- Trokendi     Outpatient Medications Prior to Visit  Medication Sig Dispense Refill   aspirin  EC 81 MG tablet Take 1 tablet (81 mg total) by mouth daily. 30 tablet 1   atorvastatin  (LIPITOR) 40 MG tablet Take 1 tablet (40 mg total) by mouth daily. 90 tablet 1   gabapentin  (NEURONTIN ) 600 MG tablet Take 1 tablet (600 mg total) by mouth 3 (three) times daily. 270 tablet 0   levETIRAcetam  (KEPPRA  XR) 500 MG 24 hr tablet TAKE 1 TABLET BY MOUTH AT BEDTIME. 90 tablet 0    lisinopril -hydrochlorothiazide  (ZESTORETIC ) 20-25 MG tablet Take 1 tablet by mouth daily. 90 tablet 1   lidocaine  (LIDODERM ) 5 % Place 1 patch onto the skin daily. Remove & Discard patch within 12 hours or as directed by MD 30 patch 0   No facility-administered medications prior to visit.     ROS Review of Systems  Constitutional:  Negative for activity change and appetite change.  HENT:  Negative for sinus pressure and sore throat.   Respiratory:  Negative for chest tightness, shortness of breath and wheezing.   Cardiovascular:  Negative for chest pain and palpitations.  Gastrointestinal:  Negative for abdominal distention, abdominal pain and constipation.  Genitourinary: Negative.   Musculoskeletal:  See HPI  Psychiatric/Behavioral:  Negative for behavioral problems and dysphoric mood.     Objective:  BP 119/79   Pulse 65   Ht 5\' 8"  (1.727 m)   Wt 242 lb 9.6 oz (110 kg)   LMP 11/24/2011   SpO2 99%   BMI 36.89 kg/m      03/06/2024    8:41 AM 01/29/2024   10:36 AM 07/31/2023   10:25 AM  BP/Weight  Systolic BP 119 136 100  Diastolic BP 79 85 67  Wt. (Lbs) 242.6 243 245.4  BMI 36.89 kg/m2 36.95 kg/m2 37.31 kg/m2      Physical Exam Constitutional:      Appearance: She is well-developed.  HENT:     Right Ear: Tympanic membrane normal.     Left Ear: Tympanic membrane normal.     Mouth/Throat:     Mouth: Mucous membranes are moist.  Cardiovascular:     Rate and Rhythm: Normal rate.     Heart sounds: Normal heart sounds. No murmur heard. Pulmonary:     Effort: Pulmonary effort is normal.     Breath sounds: Normal breath sounds. No wheezing or rales.  Chest:     Chest wall: No tenderness.  Abdominal:     General: Bowel sounds are normal. There is no distension.     Palpations: Abdomen is soft. There is no mass.     Tenderness: There is no abdominal tenderness.  Musculoskeletal:     Right lower leg: No edema.     Left lower leg: No edema.     Comments:  Slight tenderness on range of motion of left knee No tenderness on palpation of both arms but tenderness present in left biceps on abduction Normal handgrip bilaterally  Lymphadenopathy:     Cervical: Cervical adenopathy (L anterior) present.  Neurological:     Mental Status: She is alert and oriented to person, place, and time.  Psychiatric:        Mood and Affect: Mood normal.        Latest Ref Rng & Units 01/29/2024   11:34 AM 07/31/2023   11:06 AM 03/13/2023    9:55 AM  CMP  Glucose 70 - 99 mg/dL 78  88  79   BUN 8 - 27 mg/dL 14  14  12    Creatinine 0.57 - 1.00 mg/dL 4.09  8.11  9.14   Sodium 134 - 144 mmol/L 142  142  143   Potassium 3.5 - 5.2 mmol/L 4.0  4.1  4.1   Chloride 96 - 106 mmol/L 100  100  103   CO2 20 - 29 mmol/L 29  28  27    Calcium  8.7 - 10.3 mg/dL 9.4  9.9  9.8   Total Protein 6.0 - 8.5 g/dL 7.2  7.2  7.0   Total Bilirubin 0.0 - 1.2 mg/dL 0.6  1.6  1.4   Alkaline Phos 44 - 121 IU/L 61  62  59   AST 0 - 40 IU/L 24  16  17    ALT 0 - 32 IU/L 15  10  12      Lipid Panel     Component Value Date/Time   CHOL 147 03/13/2023 0955   TRIG 131 03/13/2023 0955   HDL 43 03/13/2023 0955   CHOLHDL 3.7 01/20/2020 0841   CHOLHDL 6.4 (H) 12/06/2016 1012   VLDL 35 (H) 12/06/2016 1012   LDLCALC 81 03/13/2023 0955    CBC    Component Value Date/Time  WBC 3.4 03/13/2023 0955   WBC 4.0 12/06/2016 1012   RBC 4.36 03/13/2023 0955   RBC 4.59 12/06/2016 1012   HGB 12.4 03/13/2023 0955   HCT 38.9 03/13/2023 0955   PLT 216 03/13/2023 0955   MCV 89 03/13/2023 0955   MCH 28.4 03/13/2023 0955   MCH 27.9 12/06/2016 1012   MCHC 31.9 03/13/2023 0955   MCHC 32.2 12/06/2016 1012   RDW 14.5 03/13/2023 0955   LYMPHSABS 1.8 03/13/2023 0955   MONOABS 400 12/06/2016 1012   EOSABS 0.1 03/13/2023 0955   BASOSABS 0.0 03/13/2023 0955    Lab Results  Component Value Date   HGBA1C 5.9 (H) 01/29/2024       Assessment & Plan Osteoarthritis of the knee Severe left knee  osteoarthritis causing significant pain and mobility issues. Surgery consideration discussed. - Provided Dr. Mollie Anger contact at [facility] for surgical consultation. - Recommended over-the-counter knee brace for support.  Left arm pain Intermittent left arm pain likely from supporting herself while rising from a low toilet. No numbness or weakness. - Prescribed Voltaren gel. - Advised heat and massage. - Prescribed Lidoderm  patches.   Lymphadenopathy Left neck swelling likely due to recent cold. No infection or inflammation. Expected to resolve spontaneously. - Patient reassured      Meds ordered this encounter  Medications   Misc. Devices MISC    Sig: Raised toilet seat. Diagnosis- Osteoarthritis of the knees    Dispense:  1 each    Refill:  0   lidocaine  (LIDODERM ) 5 %    Sig: Place 1 patch onto the skin daily. Remove & Discard patch within 12 hours or as directed by MD    Dispense:  90 patch    Refill:  1   diclofenac Sodium (VOLTAREN) 1 % GEL    Sig: Apply 4 g topically 4 (four) times daily.    Dispense:  100 g    Refill:  1    Follow-up: Return for previously scheduled appointment.       Joaquin Mulberry, MD, FAAFP. [facility] [place], [place] [phone]   03/06/2024, 8:58 AM

## 2024-03-06 NOTE — Patient Instructions (Signed)
 [address] [place],  [place]  [address]  Get Driving Directions Main: [phone]

## 2024-03-14 ENCOUNTER — Other Ambulatory Visit (INDEPENDENT_AMBULATORY_CARE_PROVIDER_SITE_OTHER): Payer: Self-pay

## 2024-03-14 ENCOUNTER — Telehealth: Payer: Self-pay

## 2024-03-14 ENCOUNTER — Ambulatory Visit (INDEPENDENT_AMBULATORY_CARE_PROVIDER_SITE_OTHER): Admitting: Orthopaedic Surgery

## 2024-03-14 VITALS — Ht 66.73 in | Wt 237.6 lb

## 2024-03-14 DIAGNOSIS — M1712 Unilateral primary osteoarthritis, left knee: Secondary | ICD-10-CM

## 2024-03-14 NOTE — Telephone Encounter (Signed)
 Faxed Surgical clearance sheet to PCP ([facility])

## 2024-03-14 NOTE — Progress Notes (Signed)
 Office Visit Note   Patient: Alison Ward           Date of Birth: December 23, 1960           MRN: [id number] Visit Date: 03/14/2024              Requested by: Joaquin Mulberry, MD [address] [place] [address] [place],  [place] [address] PCP: Joaquin Mulberry, MD   Assessment & Plan: Visit Diagnoses:  1. Primary osteoarthritis of left knee     Plan: History of Present Illness Alison Ward is a 63 year old female who presents with persistent left knee pain.  She experiences persistent pain in her left knee, primarily on the lateral side, with occasional night pain. The pain is associated with swelling that can become significant. X-rays from a year ago showed arthritis, particularly on the lateral side. There is mild swelling and effusion in the knee. No history of blood clots or nickel allergy.  Physical Exam MUSCULOSKELETAL: Left knee with normal extension, slight valgus alignment, flexion 95-100 degrees with pain, mild swelling, and mild effusion.  Assessment and Plan Severe osteoarthritis of left knee Chronic osteoarthritis with lateral pain and mild swelling.  - Order new x-rays of left knee to assess current status. - Schedule total knee arthroplasty for end of June, pending surgical clearance. - Ensure preoperative clearance from primary care physician.  Heart murmur Evaluated by cardiologist and cleared for surgery.  Impression is severe left knee degenerative joint disease secondary to Osteoarthritis.  Bone on bone joint space narrowing is seen on radiographs with mild valgus alignment.  At this point, conservative treatments including cortisone shots, PT, activity modification fail to provide any significant relief and the pain is severely affecting ADLs and quality of life.  Based on treatment options, the patient has elected to move forward with a knee replacement.  We have discussed the surgical risks that include but are not limited to infection, DVT,  leg length discrepancy, stiffness, numbness, tingling, incomplete relief of pain.  Recovery and prognosis were also reviewed.    Anticoagulants: No antithrombotic Postop anticoagulation: Eliquis Diabetic: No  Nickel allergy: No Prior DVT/PE: No Tobacco use: No Clearances needed for surgery: [facility] Anticipated discharge dispo: Home  Follow-Up Instructions: No follow-ups on file.   Orders:  Orders Placed This Encounter  Procedures   XR KNEE 3 VIEW LEFT   No orders of the defined types were placed in this encounter.   Subjective: Chief Complaint  Patient presents with   Left Knee - Consult    HPI  Review of Systems  Constitutional: Negative.   HENT: Negative.    Eyes: Negative.   Respiratory: Negative.    Cardiovascular: Negative.   Endocrine: Negative.   Musculoskeletal: Negative.   Neurological: Negative.   Hematological: Negative.   Psychiatric/Behavioral: Negative.    All other systems reviewed and are negative.    Objective: Vital Signs: Ht 5' 6.73" (1.695 m)   Wt 237 lb 9.6 oz (107.8 kg)   LMP 11/24/2011   BMI 37.51 kg/m   Physical Exam Vitals and nursing note reviewed.  Constitutional:      Appearance: She is well-developed.  HENT:     Head: Normocephalic and atraumatic.  Pulmonary:     Effort: Pulmonary effort is normal.  Abdominal:     Palpations: Abdomen is soft.  Musculoskeletal:     Cervical back: Neck supple.  Skin:    General: Skin is warm.     Capillary Refill:  Capillary refill takes less than 2 seconds.  Neurological:     Mental Status: She is alert and oriented to person, place, and time.  Psychiatric:        Behavior: Behavior normal.        Thought Content: Thought content normal.        Judgment: Judgment normal.     Imaging: XR KNEE 3 VIEW LEFT Result Date: 03/14/2024 X-rays of the left knee show advanced tricompartmental degenerative joint disease.  Bone-on-bone joint space narrowing of the lateral compartment    PMFS  History: Patient Active Problem List   Diagnosis Date Noted   Acute pain of right knee 05/23/2023   Murmur, cardiac 01/24/2023   Primary osteoarthritis of left knee 05/19/2022   Lumbar radiculopathy 03/11/2018   Ganglion cyst of right foot 07/04/2017   Chronic tension-type headache, not intractable 12/30/2015   Post-operative state 12/15/2015   Simple partial seizure disorder (HCC) 11/04/2015   Left sided numbness 09/25/2015   Leukopenia 09/25/2015   Tension vascular headache 09/07/2015   Meralgia paresthetica of left side 09/07/2015   Fainting spell 09/07/2015   Prediabetes 09/02/2015   Stye 08/06/2014   Essential hypertension, benign 04/16/2014   CVA (cerebral vascular accident) (HCC) 04/16/2014   Headache 04/16/2014   Tension headache 03/06/2014   Obesity, morbid (HCC) 08/13/2013   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   ICH (intracerebral hemorrhage) (HCC) 07/21/2013   Hypokalemia 07/21/2013   Intracerebral hemorrhage (HCC) 07/06/2013   Essential hypertension 02/24/2009   Past Medical History:  Diagnosis Date   Chronic headaches    daily, bitemporal, associated with tingling in face   Heart murmur    Hypertension    Seizures (HCC)    partial seizures last one on 10/22/15  On Keppra  currently - last seizure 08-2018 x 2 small issues    Stroke (HCC) 07/05/2013    Family History  Problem Relation Age of Onset   Hypertension Mother    Diabetes Mother    Stroke Mother    Hypertension Father    Colon cancer Neg Hx    Breast cancer Neg Hx    Colon polyps Neg Hx    Esophageal cancer Neg Hx    Rectal cancer Neg Hx    Stomach cancer Neg Hx     Past Surgical History:  Procedure Laterality Date   ABDOMINAL HYSTERECTOMY N/A 12/15/2015   Procedure: HYSTERECTOMY ABDOMINAL;  Surgeon: Ana Balling, MD;  Location: [facility];  Service: Gynecology;  Laterality: N/A;   CESAREAN SECTION     ceserean section     COLONOSCOPY     POLYPECTOMY     SALPINGOOPHORECTOMY  Bilateral 12/15/2015   Procedure: SALPINGO OOPHORECTOMY;  Surgeon: Ana Balling, MD;  Location: [facility];  Service: Gynecology;  Laterality: Bilateral;   VENTRICULOSTOMY Left 07/07/2013   Procedure: VENTRICULOSTOMY;  Surgeon: Elder Greening, MD;  Location: [facility];  Service: Neurosurgery;  Laterality: Left;  Left Ventriculostomy and Removal of Right Vetriculostomy   Social History   Occupational History   Occupation: [employer]  Tobacco Use   Smoking status: Never   Smokeless tobacco: Never  Vaping Use   Vaping status: Never Used  Substance and Sexual Activity   Alcohol use: No   Drug use: No   Sexual activity: Not Currently    Birth control/protection: Surgical

## 2024-03-18 ENCOUNTER — Telehealth: Payer: Self-pay | Admitting: Family Medicine

## 2024-03-18 NOTE — Telephone Encounter (Signed)
 Copied from CRM [id number]. Topic: General - Other >> Mar 18, 2024 11:22 AM Hobson Luna F wrote:  Reason for CRM: Patient is calling in because she wanted to leave a message for Dr. Jennet Mode nurse. Patient has a surgery coming up and the orthopedic doctor is going to be faxing over a surgical clearance form that needs to be completed.

## 2024-03-18 NOTE — Telephone Encounter (Signed)
Noted, will be on the look out for the paperwork.

## 2024-04-04 ENCOUNTER — Encounter: Payer: Self-pay | Admitting: Family Medicine

## 2024-05-05 ENCOUNTER — Other Ambulatory Visit: Payer: Self-pay | Admitting: Family Medicine

## 2024-05-05 DIAGNOSIS — G5712 Meralgia paresthetica, left lower limb: Secondary | ICD-10-CM

## 2024-05-14 ENCOUNTER — Ambulatory Visit (HOSPITAL_COMMUNITY)
Admission: RE | Admit: 2024-05-14 | Discharge: 2024-05-14 | Disposition: A | Discharge status: Home / Self Care | Source: Ambulatory Visit | Attending: Cardiovascular Disease | Admitting: Cardiovascular Disease

## 2024-05-14 DIAGNOSIS — I340 Nonrheumatic mitral (valve) insufficiency: Secondary | ICD-10-CM | POA: Diagnosis present

## 2024-05-14 DIAGNOSIS — R011 Cardiac murmur, unspecified: Secondary | ICD-10-CM | POA: Diagnosis present

## 2024-05-14 LAB — ECHOCARDIOGRAM COMPLETE
AR max vel: 1.48 cm2
AV Area VTI: 1.06 cm2
AV Area mean vel: 1.33 cm2
AV Mean grad: 34 mmHg
AV Peak grad: 58.8 mmHg
Ao pk vel: 3.83 m/s
Area-P 1/2: 3.39 cm2
P 1/2 time: 613 ms
S' Lateral: 3.1 cm

## 2024-05-15 ENCOUNTER — Other Ambulatory Visit: Payer: Self-pay | Admitting: Adult Health

## 2024-05-15 ENCOUNTER — Other Ambulatory Visit: Payer: Self-pay | Admitting: Physician Assistant

## 2024-05-15 DIAGNOSIS — G40109 Localization-related (focal) (partial) symptomatic epilepsy and epileptic syndromes with simple partial seizures, not intractable, without status epilepticus: Secondary | ICD-10-CM

## 2024-05-15 DIAGNOSIS — I619 Nontraumatic intracerebral hemorrhage, unspecified: Secondary | ICD-10-CM

## 2024-05-15 MED ORDER — ONDANSETRON HCL 4 MG PO TABS: 4.0000 mg | ORAL_TABLET | Freq: Three times a day (TID) | ORAL | 0 refills | Status: DC | PRN

## 2024-05-15 MED ORDER — OXYCODONE-ACETAMINOPHEN 5-325 MG PO TABS: 1.0000 | ORAL_TABLET | Freq: Four times a day (QID) | ORAL | 0 refills | Status: DC | PRN

## 2024-05-15 MED ORDER — DOCUSATE SODIUM 100 MG PO CAPS: 100.0000 mg | ORAL_CAPSULE | Freq: Every day | ORAL | 2 refills | Status: DC | PRN

## 2024-05-15 MED ORDER — METHOCARBAMOL 750 MG PO TABS: 750.0000 mg | ORAL_TABLET | Freq: Three times a day (TID) | ORAL | 2 refills | Status: DC | PRN

## 2024-05-16 NOTE — Progress Notes (Signed)
 Surgical Instructions   Your procedure is scheduled on Monday May 26, 2024. Report to [facility] Main Entrance A at 8:45 A.M., then check in with the Admitting office. Any questions or running late day of surgery: call [phone]  Questions prior to your surgery date: call [phone], Monday-Friday, 8am-4pm. If you experience any cold or flu symptoms such as cough, fever, chills, shortness of breath, etc. between now and your scheduled surgery, please notify us  at the above number.     Remember:  Do not eat after midnight the night before your surgery  You may drink clear liquids until 8:15 the morning of your surgery.   Clear liquids allowed are: Water, Non-Citrus Juices (without pulp), Carbonated Beverages, Clear Tea (no milk, honey, etc.), Black Coffee Only (NO MILK, CREAM OR POWDERED CREAMER of any kind), and Gatorade.  Patient Instructions  The night before surgery:  No food after midnight. ONLY clear liquids after midnight  The day of surgery (if you do NOT have diabetes):  Drink ONE (1) Pre-Surgery Clear Ensure by 8:15 the morning of surgery. Drink in one sitting. Do not sip.  This drink was given to you during your hospital  pre-op appointment visit.  Nothing else to drink after completing the  Pre-Surgery Clear Ensure.         If you have questions, please contact your surgeon's office.    Take these medicines the morning of surgery with A SIP OF WATER  atorvastatin  (LIPITOR)  gabapentin  (NEURONTIN )    May take these medicines IF NEEDED: methocarbamol  (ROBAXIN )  ondansetron  (ZOFRAN )    Follow your surgeon's instructions on when to stop Asprin.  If no instructions were given by your surgeon then you will need to call the office to get those instructions.     One week prior to surgery, STOP taking any Aleve, Naproxen, Ibuprofen , Motrin , Advil , Goody's, BC's, all herbal medications, fish oil, and non-prescription vitamins.  This includes your diclofenac   Sodium (VOLTAREN ) 1 % GEL.                       Do NOT Smoke (Tobacco/Vaping) for 24 hours prior to your procedure.  If you use a CPAP at night, you may bring your mask/headgear for your overnight stay.   You will be asked to remove any contacts, glasses, piercing's, hearing aid's, dentures/partials prior to surgery. Please bring cases for these items if needed.    Patients discharged the day of surgery will not be allowed to drive home, and someone needs to stay with them for 24 hours.  SURGICAL WAITING ROOM VISITATION Patients may have no more than 2 support people in the waiting area - these visitors may rotate.   Pre-op nurse will coordinate an appropriate time for 1 ADULT support person, who may not rotate, to accompany patient in pre-op.  Children under the age of 22 must have an adult with them who is not the patient and must remain in the main waiting area with an adult.  If the patient needs to stay at the hospital during part of their recovery, the visitor guidelines for inpatient rooms apply.  Please refer to the [facility] website for the visitor guidelines for any additional information.   If you received a COVID test during your pre-op visit  it is requested that you wear a mask when out in public, stay away from anyone that may not be feeling well and notify your surgeon if you develop symptoms. If  you have been in contact with anyone that has tested positive in the last 10 days please notify you surgeon.      Pre-operative 5 CHG Bathing Instructions   You can play a key role in reducing the risk of infection after surgery. Your skin needs to be as free of germs as possible. You can reduce the number of germs on your skin by washing with CHG (chlorhexidine  gluconate) soap before surgery. CHG is an antiseptic soap that kills germs and continues to kill germs even after washing.   DO NOT use if you have an allergy to chlorhexidine /CHG or antibacterial soaps. If your skin  becomes reddened or irritated, stop using the CHG and notify one of our RNs at [phone].   Please shower with the CHG soap starting 4 days before surgery using the following schedule:     Please keep in mind the following:  DO NOT shave, including legs and underarms, starting the day of your first shower.   Place clean sheets on your bed the day you start using CHG soap. Use a clean washcloth (not used since being washed) for each shower. DO NOT sleep with pets once you start using the CHG.   CHG Shower Instructions:  Wash your face and private area with normal soap. If you choose to wash your hair, wash first with your normal shampoo.  After you use shampoo/soap, rinse your hair and body thoroughly to remove shampoo/soap residue.  Turn the water OFF and apply about 3 tablespoons (45 ml) of CHG soap to a CLEAN washcloth.  Apply CHG soap ONLY FROM YOUR NECK DOWN TO YOUR TOES (washing for 3-5 minutes)  DO NOT use CHG soap on face, private areas, open wounds, or sores.  Pay special attention to the area where your surgery is being performed.  If you are having back surgery, having someone wash your back for you may be helpful. Wait 2 minutes after CHG soap is applied, then you may rinse off the CHG soap.  Pat dry with a clean towel  Put on clean clothes/pajamas   If you choose to wear lotion, please use ONLY the CHG-compatible lotions that are listed below.  Additional instructions for the day of surgery: DO NOT APPLY any lotions, deodorants or perfumes.   Do not bring valuables to the hospital. [facility] is not responsible for any belongings/valuables. Do not wear nail polish, gel polish, artificial nails, or any other type of covering on natural nails (fingers and toes) Do not wear jewelry or makeup Put on clean/comfortable clothes.  Please brush your teeth.  Ask your nurse before applying any prescription medications to the skin.     CHG Compatible Lotions   Aveeno  Moisturizing lotion  Cetaphil Moisturizing Cream  Cetaphil Moisturizing Lotion  Clairol Herbal Essence Moisturizing Lotion, Dry Skin  Clairol Herbal Essence Moisturizing Lotion, Extra Dry Skin  Clairol Herbal Essence Moisturizing Lotion, Normal Skin  Curel Age Defying Therapeutic Moisturizing Lotion with Alpha Hydroxy  Curel Extreme Care Body Lotion  Curel Soothing Hands Moisturizing Hand Lotion  Curel Therapeutic Moisturizing Cream, Fragrance-Free  Curel Therapeutic Moisturizing Lotion, Fragrance-Free  Curel Therapeutic Moisturizing Lotion, Original Formula  Eucerin Daily Replenishing Lotion  Eucerin Dry Skin Therapy Plus Alpha Hydroxy Crme  Eucerin Dry Skin Therapy Plus Alpha Hydroxy Lotion  Eucerin Original Crme  Eucerin Original Lotion  Eucerin Plus Crme Eucerin Plus Lotion  Eucerin TriLipid Replenishing Lotion  Keri Anti-Bacterial Hand Lotion  Keri Deep Conditioning Original Lotion Dry Skin  Formula Softly Scented  Keri Deep Conditioning Original Lotion, Fragrance Free Sensitive Skin Formula  Keri Lotion Fast Absorbing Fragrance Free Sensitive Skin Formula  Keri Lotion Fast Absorbing Softly Scented Dry Skin Formula  Keri Original Lotion  Keri Skin Renewal Lotion Keri Silky Smooth Lotion  Keri Silky Smooth Sensitive Skin Lotion  Nivea Body Creamy Conditioning Oil  Nivea Body Extra Enriched Lotion  Nivea Body Original Lotion  Nivea Body Sheer Moisturizing Lotion Nivea Crme  Nivea Skin Firming Lotion  NutraDerm 30 Skin Lotion  NutraDerm Skin Lotion  NutraDerm Therapeutic Skin Cream  NutraDerm Therapeutic Skin Lotion  ProShield Protective Hand Cream  Provon moisturizing lotion  Please read over the following fact sheets that you were given.

## 2024-05-19 ENCOUNTER — Encounter (HOSPITAL_COMMUNITY): Payer: Self-pay

## 2024-05-19 ENCOUNTER — Other Ambulatory Visit: Payer: Self-pay

## 2024-05-19 ENCOUNTER — Encounter (HOSPITAL_COMMUNITY)
Admission: RE | Admit: 2024-05-19 | Discharge: 2024-05-19 | Disposition: A | Discharge status: Home / Self Care | Source: Ambulatory Visit | Attending: Orthopaedic Surgery | Admitting: Orthopaedic Surgery

## 2024-05-19 VITALS — BP 130/95 | HR 66 | Temp 97.8°F | Resp 18 | Ht 68.0 in | Wt 238.7 lb

## 2024-05-19 DIAGNOSIS — Z01810 Encounter for preprocedural cardiovascular examination: Secondary | ICD-10-CM | POA: Diagnosis present

## 2024-05-19 DIAGNOSIS — Z01818 Encounter for other preprocedural examination: Secondary | ICD-10-CM | POA: Insufficient documentation

## 2024-05-19 DIAGNOSIS — Z01812 Encounter for preprocedural laboratory examination: Secondary | ICD-10-CM | POA: Diagnosis present

## 2024-05-19 DIAGNOSIS — M1712 Unilateral primary osteoarthritis, left knee: Secondary | ICD-10-CM | POA: Insufficient documentation

## 2024-05-19 HISTORY — DX: Unspecified osteoarthritis, unspecified site: M19.90

## 2024-05-19 HISTORY — DX: Tremor, unspecified: R25.1

## 2024-05-19 HISTORY — DX: Prediabetes: R73.03

## 2024-05-19 LAB — CBC
HCT: 40 % (ref 36.0–46.0)
Hemoglobin: 12.5 g/dL (ref 12.0–15.0)
MCH: 28.2 pg (ref 26.0–34.0)
MCHC: 31.3 g/dL (ref 30.0–36.0)
MCV: 90.3 fL (ref 80.0–100.0)
Platelets: 204 K/uL (ref 150–400)
RBC: 4.43 MIL/uL (ref 3.87–5.11)
RDW: 15.2 % (ref 11.5–15.5)
WBC: 4.7 K/uL (ref 4.0–10.5)
nRBC: 0 % (ref 0.0–0.2)

## 2024-05-19 LAB — SURGICAL PCR SCREEN
MRSA, PCR: NEGATIVE
Staphylococcus aureus: NEGATIVE

## 2024-05-19 LAB — BASIC METABOLIC PANEL WITH GFR
Anion gap: 8 (ref 5–15)
BUN: 13 mg/dL (ref 8–23)
CO2: 30 mmol/L (ref 22–32)
Calcium: 9.6 mg/dL (ref 8.9–10.3)
Chloride: 102 mmol/L (ref 98–111)
Creatinine, Ser: 0.88 mg/dL (ref 0.44–1.00)
GFR, Estimated: >60 mL/min (ref >60)
Glucose, Bld: 85 mg/dL (ref 70–99)
Potassium: 4.1 mmol/L (ref 3.5–5.1)
Sodium: 140 mmol/L (ref 135–145)

## 2024-05-19 NOTE — Progress Notes (Signed)
 PCP - Dr. Marijo Sabin Cardiologist - Dr. Aleene Finely    -patient has a follow-up appointment on 7/23 to review recent Echo  PPM/ICD - denies   Chest x-ray - denies EKG - 05/19/24 Stress Test - 2009 ECHO - 05/14/24 Cardiac Cath - denies  Sleep Study - denies  Pre-diabetes  Last dose of GLP1 agonist-  n/a GLP1 instructions:  n/a  Blood Thinner Instructions: n/a Aspirin  Instructions: previous clearance note from 08/17/23 states that Aspirin  can be stopped for 5 days prior to procedure but patient is going to follow-up with cardiologist for instructions   ERAS Protcol - clears until 0815 PRE-SURGERY Ensure or G2- Ensure as ordered  COVID TEST- n/a   Anesthesia review: yes - cardiology follow-up appointment  Patient denies shortness of breath, fever, cough and chest pain at PAT appointment   All instructions explained to the patient, with a verbal understanding of the material. Patient agrees to go over the instructions while at home for a better understanding. Patient also instructed to self quarantine after being tested for COVID-19. The opportunity to ask questions was provided.

## 2024-05-21 NOTE — Progress Notes (Deleted)
 Cardiology Office Note    Date:  05/21/2024  ID:  Alison Ward, DOB 1961/08/06, MRN [id number] PCP:  Delbert Clam, MD  Cardiologist:  Aleene Passe, MD (Inactive)  Electrophysiologist:  None   Chief Complaint: ***  History of Present Illness: Alison Ward    Alison Ward is a 63 y.o. female with visit-pertinent history of mitral regurgitation, hypertension, hemorrhagic stroke, hyperlipidemia.  Echocardiogram on 03/19/2023 indicated LVEF of 60 to 5%, no RWMA, mild concentric LVH, diastolic parameters were normal, RV systolic function and size was normal, mildly elevated pulmonary artery systolic pressures, LA was severely dilated, mitral valve was normal in structure, moderate mitral valve regurgitation with no evidence of stenosis, aortic valve was normal in structure, regurgitation was mild with no evidence of stenosis.   Patient was previously seen by Dr. Passe on 05/14/24 for evaluation of mitral valve regurgitation.  Patient denied any chest pain or shortness of breath, noted that she was walking some however was limited by knee pain.  Repeat echo in 1 year was recommended.  Today she presents for follow-up.  She reports that she  Mitral valve regurgitation:   Labwork independently reviewed:   ROS: .   *** denies chest pain, shortness of breath, lower extremity edema, fatigue, palpitations, melena, hematuria, hemoptysis, diaphoresis, weakness, presyncope, syncope, orthopnea, and PND.  All other systems are reviewed and otherwise negative.  Studies Reviewed: Alison Ward    EKG:  EKG is ordered today, personally reviewed, demonstrating ***     CV Studies: Cardiac studies reviewed are outlined and summarized above. Otherwise please see EMR for full report. Cardiac Studies & Procedures   ______________________________________________________________________________________________     ECHOCARDIOGRAM  ECHOCARDIOGRAM COMPLETE 05/14/2024  Narrative ECHOCARDIOGRAM  REPORT    Patient Name:   Alison Ward Date of Exam: 05/14/2024 Medical Rec #:  [id number]                Height:       66.7 in Accession #:    [id number]               Weight:       237.6 lb Date of Birth:  01/18/1961                BSA:          2.169 m Patient Age:    63 years                 BP:           119/79 mmHg Patient Gender: F                        HR:           61 bpm. Exam Location:  [address]  Procedure: 2D Echo, 3D Echo, Cardiac Doppler and Color Doppler (Both Spectral and Color Flow Doppler were utilized during procedure).  Indications:     R01.1 Murmur  History:         Patient has prior history of Echocardiogram examinations, most recent 03/19/2023. Stroke, Mitral Valve Disease, Signs/Symptoms:Murmur; Risk Factors:Family History of Coronary Artery Disease and Hypertension. Headaches.  Sonographer:     Heather Hawks RDCS Referring Phys:  ALEENE JINNY PASSE Diagnosing Phys: Morene Brownie  IMPRESSIONS   1. Left ventricular ejection fraction, by estimation, is 60 to 65%. Left ventricular ejection fraction by 3D volume is 67 %. The left ventricle has normal function. The left ventricle has no regional  wall motion abnormalities. Left ventricular diastolic parameters were normal. 2. Right ventricular systolic function is normal. The right ventricular size is normal. There is mildly elevated pulmonary artery systolic pressure. The estimated right ventricular systolic pressure is 43.7 mmHg. 3. Left atrial size was severely dilated. 4. Large, eccentric, anteriorally directed jet that wraps the left atrium with significant splay. Suspect likely severe, TEE may be helpful for further characterization. The mitral valve is normal in structure. Severe mitral valve regurgitation. No evidence of mitral stenosis. 5. Increased aortic valve gradients measured, but the valve appears thin, no calcifications, and opens well. Suspect measured gradient from eccentric MR  along CW interrogation line. The aortic valve is tricuspid. Aortic valve regurgitation is mild to moderate. No aortic stenosis is present. 6. 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 7. The inferior vena cava is normal in size with greater than 50% respiratory variability, suggesting right atrial pressure of 3 mmHg.  FINDINGS Left Ventricle: Left ventricular ejection fraction, by estimation, is 60 to 65%. Left ventricular ejection fraction by 3D volume is 67 %. The left ventricle has normal function. The left ventricle has no regional wall motion abnormalities. The left ventricular internal cavity size was normal in size. There is no left ventricular hypertrophy. Left ventricular diastolic parameters were normal.  Right Ventricle: The right ventricular size is normal. No increase in right ventricular wall thickness. Right ventricular systolic function is normal. There is mildly elevated pulmonary artery systolic pressure. The tricuspid regurgitant velocity is 3.19 m/s, and with an assumed right atrial pressure of 3 mmHg, the estimated right ventricular systolic pressure is 43.7 mmHg.  Left Atrium: Left atrial size was severely dilated.  Right Atrium: Right atrial size was normal in size.  Pericardium: There is no evidence of pericardial effusion.  Mitral Valve: Large, eccentric, anteriorally directed jet that wraps the left atrium with significant splay. Suspect likely severe, TEE may be helpful for further characterization. The mitral valve is normal in structure. Severe mitral valve regurgitation. No evidence of mitral valve stenosis.  Tricuspid Valve: The tricuspid valve is normal in structure. Tricuspid valve regurgitation is mild . No evidence of tricuspid stenosis.  Aortic Valve: Increased aortic valve gradients measured, but the valve appears thin, no calcifications, and opens well. Suspect measured gradient from eccentric MR along CW interrogation line. The aortic valve is tricuspid. Aortic  valve regurgitation is mild to moderate. Aortic regurgitation PHT measures 613 msec. No aortic stenosis is present. Aortic valve mean gradient measures 34.0 mmHg. Aortic valve peak gradient measures 58.8 mmHg. Aortic valve area, by VTI measures 1.06 cm.  Pulmonic Valve: The pulmonic valve was normal in structure. Pulmonic valve regurgitation is mild. 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  Aorta: The aortic root is normal in size and structure.  Venous: The inferior vena cava is normal in size with greater than 50% respiratory variability, suggesting right atrial pressure of 3 mmHg.  IAS/Shunts: No atrial level shunt detected by color flow Doppler.  Additional Comments: 3D was performed not requiring image post processing on an independent workstation and was normal.   LEFT VENTRICLE PLAX 2D LVIDd:         4.40 cm         Diastology LVIDs:         3.10 cm         LV e' medial:    11.70 cm/s LV PW:         1.40 cm         LV E/e' medial:  11.5 LV IVS:        1.10 cm         LV e' lateral:   13.20 cm/s LVOT diam:     2.60 cm         LV E/e' lateral: 10.2 LV SV:         111 LV SV Index:   51 LVOT Area:     5.31 cm        3D Volume EF LV 3D EF:    Left ventricul ar ejection fraction by 3D volume is 67 %.  3D Volume EF: 3D EF:        67 % LV EDV:       150 ml LV ESV:       49 ml LV SV:        101 ml  RIGHT VENTRICLE RV Basal diam:  3.50 cm RV S prime:     14.80 cm/s TAPSE (M-mode): 2.5 cm RVSP:           43.7 mmHg  LEFT ATRIUM              Index        RIGHT ATRIUM           Index LA diam:        3.60 cm  1.66 cm/m   RA Pressure: 3.00 mmHg LA Vol (A2C):   109.0 ml 50.24 ml/m  RA Area:     21.00 cm LA Vol (A4C):   133.0 ml 61.31 ml/m  RA Volume:   68.90 ml  31.76 ml/m LA Biplane Vol: 129.0 ml 59.46 ml/m AORTIC VALVE                     PULMONIC VALVE AV Area (Vmax):    1.48 cm      PV Vmax:       2.34 m/s AV Area (Vmean):   1.33 cm      PV Vmean:      157.600  cm/s AV Area (VTI):     1.06 cm      PV VTI:        0.580 m AV Vmax:           383.25 cm/s   PV Peak grad:  22.0 mmHg AV Vmean:          269.250 cm/s  PV Mean grad:  11.8 mmHg AV VTI:            1.045 m AV Peak Grad:      58.8 mmHg AV Mean Grad:      34.0 mmHg LVOT Vmax:         107.00 cm/s LVOT Vmean:        67.400 cm/s LVOT VTI:          0.209 m LVOT/AV VTI ratio: 0.20 AI PHT:            613 msec  AORTA Ao Root diam: 3.20 cm Ao Asc diam:  3.80 cm  MITRAL VALVE                TRICUSPID VALVE MV Area (PHT)  cm          TR Peak grad:   40.7 mmHg MV Decel Time: 224 msec     TR Vmax:        319.00 cm/s MV E velocity: 134.50 cm/s  Estimated RAP:  3.00 mmHg MV A velocity: 91.80 cm/s   RVSP:  43.7 mmHg MV E/A ratio:  1.47 SHUNTS Systemic VTI:  0.21 m Systemic Diam: 2.60 cm  Morene Brownie Electronically signed by Morene Brownie Signature Date/Time: 05/14/2024/5:58:12 PM    Final (Updated)          ______________________________________________________________________________________________       Current Reported Medications:.    No outpatient medications have been marked as taking for the 05/22/24 encounter (Appointment) with Rooney Swails D, NP.    Physical Exam:    VS:  LMP 11/24/2011    Wt Readings from Last 3 Encounters:  05/19/24 238 lb 11.2 oz (108.3 kg)  03/14/24 237 lb 9.6 oz (107.8 kg)  03/06/24 242 lb 9.6 oz (110 kg)    GEN: Well nourished, well developed in no acute distress NECK: No JVD; No carotid bruits CARDIAC: ***RRR, no murmurs, rubs, gallops RESPIRATORY:  Clear to auscultation without rales, wheezing or rhonchi  ABDOMEN: Soft, non-tender, non-distended EXTREMITIES:  No edema; No acute deformity     Asessement and Plan:.     ***     Disposition: F/u with ***  Signed, Kenlie Seki D Sic[place] Zaragoza, NP

## 2024-05-22 ENCOUNTER — Ambulatory Visit: Admitting: Cardiology

## 2024-05-22 ENCOUNTER — Telehealth: Payer: Self-pay | Admitting: Cardiology

## 2024-05-22 ENCOUNTER — Telehealth: Payer: Self-pay | Admitting: Physician Assistant

## 2024-05-22 DIAGNOSIS — I340 Nonrheumatic mitral (valve) insufficiency: Secondary | ICD-10-CM

## 2024-05-22 NOTE — Telephone Encounter (Signed)
 ADDENDUM TO CLEARANCE REQUEST: NEED RECOMMENDATIONS FOR ASA HOLD.

## 2024-05-22 NOTE — Telephone Encounter (Signed)
     Pre-operative Risk Assessment    Patient Name: Alison Ward  DOB: Jan 05, 1961 MRN: [id number]   Date of last office visit: 05/14/24 Date of next office visit: 07/09/24   Request for Surgical Clearance    Procedure:  Left total knee replacement   Date of Surgery:  Clearance 05/26/24                                Surgeon:  Dr. Jerri Surgeon's Group or Practice Name:  [facility]  Phone number:  [phone] Fax number:  [phone]  Type of Clearance Requested:   - Medical    Type of Anesthesia:  Spinal   Additional requests/questions:  Per [facility], they initially received clearance from the patient's PCP, and the surgeon did not request cardiac clearance. However, the anesthesiologist noted that the patient's appointment was canceled today and rescheduled for September. Given that the patient was last seen by cardiology last year, the anesthesiologist is now requesting cardiac clearance prior to the procedure. She has recent echo and recent EKG on file     Signed, Deeanna GORMAN Frees   05/22/2024, 1:27 PM

## 2024-05-22 NOTE — Telephone Encounter (Signed)
 Additional info:   The patient arrived late for her appointment today because she got lost trying to find the new building. She also relies on transportation from her relatives, which contributed to the delay.

## 2024-05-22 NOTE — Telephone Encounter (Signed)
 Pt called to notify Debbie Clearance is coming from Caridiologist Please keep a llok out for it so pt ca move forward with surgery. Pt phone number is [phone].

## 2024-05-23 ENCOUNTER — Encounter (HOSPITAL_BASED_OUTPATIENT_CLINIC_OR_DEPARTMENT_OTHER): Payer: Self-pay | Admitting: Family

## 2024-05-23 ENCOUNTER — Ambulatory Visit (INDEPENDENT_AMBULATORY_CARE_PROVIDER_SITE_OTHER): Admitting: Family

## 2024-05-23 ENCOUNTER — Ambulatory Visit: Admitting: Medical

## 2024-05-23 VITALS — BP 124/78 | HR 69 | Ht 68.0 in | Wt 234.1 lb

## 2024-05-23 DIAGNOSIS — Z01810 Encounter for preprocedural cardiovascular examination: Secondary | ICD-10-CM

## 2024-05-23 DIAGNOSIS — I340 Nonrheumatic mitral (valve) insufficiency: Secondary | ICD-10-CM

## 2024-05-23 MED ORDER — TRANEXAMIC ACID 1000 MG/10ML IV SOLN
2000.0000 mg | INTRAVENOUS | Status: DC
  Filled 2024-05-23: qty 20

## 2024-05-23 NOTE — Anesthesia Preprocedure Evaluation (Addendum)
 Anesthesia Evaluation  Patient identified by MRN, date of birth, ID band Patient awake    Reviewed: Allergy & Precautions, H&P , NPO status , Patient's Chart, lab work & pertinent test results  Airway Mallampati: I  TM Distance: >3 FB Neck ROM: Full    Dental no notable dental hx. (+) Teeth Intact, Dental Advisory Given, Missing   Pulmonary neg pulmonary ROS   Pulmonary exam normal breath sounds clear to auscultation       Cardiovascular Exercise Tolerance: Good hypertension, negative cardio ROS Normal cardiovascular exam+ Valvular Problems/Murmurs  Rhythm:Regular Rate:Normal     Neuro/Psych  Headaches, Seizures -,   Neuromuscular disease CVA negative neurological ROS  negative psych ROS   GI/Hepatic negative GI ROS, Neg liver ROS,,,  Endo/Other  negative endocrine ROS    Renal/GU negative Renal ROS  negative genitourinary   Musculoskeletal negative musculoskeletal ROS (+) Arthritis ,    Abdominal   Peds negative pediatric ROS (+)  Hematology negative hematology ROS (+)   Anesthesia Other Findings   Reproductive/Obstetrics negative OB ROS                              Anesthesia Physical Anesthesia Plan  ASA: 4  Anesthesia Plan: Spinal, Regional and MAC   Post-op Pain Management: Minimal or no pain anticipated   Induction: Intravenous  PONV Risk Score and Plan: 2 and Propofol  infusion and Ondansetron   Airway Management Planned: Natural Airway and Simple Face Mask  Additional Equipment: None  Intra-op Plan:   Post-operative Plan:   Informed Consent: I have reviewed the patients History and Physical, chart, labs and discussed the procedure including the risks, benefits and alternatives for the proposed anesthesia with the patient or authorized representative who has indicated his/her understanding and acceptance.       Plan Discussed with: Anesthesiologist and  CRNA  Anesthesia Plan Comments: (PAT note by Lynwood Hope, PA-C:  63 year old female follows with cardiology for history of mitral regurgitation, aortic insufficiency, pulmonic stenosis, HTN, hemorrhagic stroke 2014, HLD.  Recent echo 05/14/2024 showed EF 60 to 65%, normal RV, mildly elevated PASP 43.7 mmHg, severely dilated LA, severe mitral regurgitation, mild aortic regurgitation, mild to moderate pulmonic stenosis.  Seen by Reche Finder, NP on 05/23/2024 for preop evaluation.  Per note, Discussed with Dr. Raford as DOD in clinic. As asymptomatic, may proceed with knee surgery as above. We will plan for TEE and consult to valve team after knee surgery. Will call her 2 weeks after knee surgery to see how she is recovering and schedule TEE.SABRASABRA According to the Revised Cardiac Risk Index (RCRI), her Perioperative Risk of Major Cardiac Event is (%): 0.4. Her Functional Capacity in METs is: 5.07 according to the Duke Activity Status Index (DASI).  Per AHA/ACC guidelines, she is deemed acceptable risk for the planned procedure without additional cardiovascular testing. Will route to surgical team so they are aware.   She is already holding Aspirin  5 days prior to procedure   Other pertinent history includes seizures well-controlled on Keppra , chronic headaches, tremor.  Preop labs reviewed, WNL.  EKG 05/19/2024: NSR. Rate 62.  TTE 05/14/2024: 1. Left ventricular ejection fraction, by estimation, is 60 to 65%. Left  ventricular ejection fraction by 3D volume is 67 %. The left ventricle has  normal function. The left ventricle has no regional wall motion  abnormalities. Left ventricular diastolic  parameters were normal.  2. Right ventricular systolic function is normal. The  right ventricular  size is normal. There is mildly elevated pulmonary artery systolic  pressure. The estimated right ventricular systolic pressure is 43.7 mmHg.  3. Left atrial size was severely dilated.  4. Large,  eccentric, anteriorally directed jet that wraps the left atrium  with significant splay. Suspect likely severe, TEE may be helpful for  further characterization. The mitral valve is normal in structure. Severe  mitral valve regurgitation. No  evidence of mitral stenosis.  5. Increased aortic valve gradients measured, but the valve appears thin,  no calcifications, and opens well. Suspect measured gradient from  eccentric MR along CW interrogation line. The aortic valve is tricuspid.  Aortic valve regurgitation is mild to  moderate. No aortic stenosis is present.  6. 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  7. The inferior vena cava is normal in size with greater than 50%  respiratory variability, suggesting right atrial pressure of 3 mmHg.    )         Anesthesia Quick Evaluation

## 2024-05-23 NOTE — Progress Notes (Signed)
 Cardiology Office Note   Date:  05/23/2024  ID:  Alfhild, Partch 1961-03-28, MRN [id number] PCP: Delbert Clam, MD  [facility] Providers Cardiologist:  Aleene Passe, MD (Inactive)     History of Present Illness Valbona Slabach is a 63 y.o. female with history of mitral regurgitation, HTN, hemorrhagic stroke ~2014, HLD, seizures.  Echo 2014 trivia MR. She recalls having TEE many years ago, but no results available.   Last seen 05/15/2023 by Dr. Passe with echo revealing moderate mitral regurgitation with suspected degree of mitral valve prolapse.  She had nonspecific T wave abnormalities which were suspected to be related due to cardiac enlargement and strain.  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  05/19/2024 EKG independently reviewed with NSR 62 bpm and no acute ST/T wave changes.  Presents today for follow-up independently.  Pleasant lady who is originally from [place]. [facility]).  She is pending left total knee arthroplasty. Enjoys cooking for her grandchildren and participating in activities through her church. Exercise tolerance >4 METS. Reports no shortness of breath nor dyspnea on exertion. Reports no chest pain, pressure, or tightness. No edema, orthopnea, PND. Reports no palpitations.  We reviewed most recent echocardiogram.  ROS: Please see the history of present illness.    All other systems reviewed and are negative.   Studies Reviewed      Cardiac Studies & Procedures   ______________________________________________________________________________________________     ECHOCARDIOGRAM  ECHOCARDIOGRAM COMPLETE  05/14/2024  Narrative ECHOCARDIOGRAM REPORT    Patient Name:   Iana Buzan Date of Exam: 05/14/2024 Medical Rec #:  [id number]                Height:       66.7 in Accession #:    [id number]               Weight:       237.6 lb Date of Birth:  18-Mar-1961                BSA:          2.169 m Patient Age:    63 years                 BP:           119/79 mmHg Patient Gender: F                        HR:           61 bpm. Exam Location:  [address]  Procedure: 2D Echo, 3D Echo, Cardiac Doppler and Color Doppler (Both Spectral and Color Flow Doppler were utilized during procedure).  Indications:     R01.1 Murmur  History:         Patient has prior history of Echocardiogram examinations, most recent 03/19/2023. Stroke, Mitral Valve Disease, Signs/Symptoms:Murmur; Risk Factors:Family History of Coronary Artery Disease and Hypertension. Headaches.  Sonographer:     Heather Hawks RDCS Referring Phys:  ALEENE JINNY PASSE Diagnosing Phys: Morene Brownie  IMPRESSIONS   1. Left ventricular ejection fraction, by estimation, is 60 to 65%. Left ventricular ejection fraction by 3D volume is 67 %. The left ventricle has normal function.  The left ventricle has no regional wall motion abnormalities. Left ventricular diastolic parameters were normal. 2. Right ventricular systolic function is normal. The right ventricular size is normal. There is mildly elevated pulmonary artery systolic pressure. The estimated right ventricular systolic pressure is 43.7 mmHg. 3. Left atrial size was severely dilated. 4. Large, eccentric, anteriorally directed jet that wraps the left atrium with significant splay. Suspect likely severe, TEE may be helpful for further characterization. The mitral valve is normal in structure. Severe mitral valve regurgitation. No evidence of mitral stenosis. 5. Increased aortic valve gradients measured, but the valve appears thin, no calcifications, and opens well.  Suspect measured gradient from eccentric MR along CW interrogation line. The aortic valve is tricuspid. Aortic valve regurgitation is mild to moderate. No aortic stenosis is present. 6. 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 7. The inferior vena cava is normal in size with greater than 50% respiratory variability, suggesting right atrial pressure of 3 mmHg.  FINDINGS Left Ventricle: Left ventricular ejection fraction, by estimation, is 60 to 65%. Left ventricular ejection fraction by 3D volume is 67 %. The left ventricle has normal function. The left ventricle has no regional wall motion abnormalities. The left ventricular internal cavity size was normal in size. There is no left ventricular hypertrophy. Left ventricular diastolic parameters were normal.  Right Ventricle: The right ventricular size is normal. No increase in right ventricular wall thickness. Right ventricular systolic function is normal. There is mildly elevated pulmonary artery systolic pressure. The tricuspid regurgitant velocity is 3.19 m/s, and with an assumed right atrial pressure of 3 mmHg, the estimated right ventricular systolic pressure is 43.7 mmHg.  Left Atrium: Left atrial size was severely dilated.  Right Atrium: Right atrial size was normal in size.  Pericardium: There is no evidence of pericardial effusion.  Mitral Valve: Large, eccentric, anteriorally directed jet that wraps the left atrium with significant splay. Suspect likely severe, TEE may be helpful for further characterization. The mitral valve is normal in structure. Severe mitral valve regurgitation. No evidence of mitral valve stenosis.  Tricuspid Valve: The tricuspid valve is normal in structure. Tricuspid valve regurgitation is mild . No evidence of tricuspid stenosis.  Aortic Valve: Increased aortic valve gradients measured, but the valve appears thin, no calcifications, and opens well. Suspect measured gradient from eccentric MR along CW interrogation  line. The aortic valve is tricuspid. Aortic valve regurgitation is mild to moderate. Aortic regurgitation PHT measures 613 msec. No aortic stenosis is present. Aortic valve mean gradient measures 34.0 mmHg. Aortic valve peak gradient measures 58.8 mmHg. Aortic valve area, by VTI measures 1.06 cm.  Pulmonic Valve: The pulmonic valve was normal in structure. Pulmonic valve regurgitation is mild. 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  Aorta: The aortic root is normal in size and structure.  Venous: The inferior vena cava is normal in size with greater than 50% respiratory variability, suggesting right atrial pressure of 3 mmHg.  IAS/Shunts: No atrial level shunt detected by color flow Doppler.  Additional Comments: 3D was performed not requiring image post processing on an independent workstation and was normal.   LEFT VENTRICLE PLAX 2D LVIDd:         4.40 cm         Diastology LVIDs:         3.10 cm         LV e' medial:    11.70 cm/s LV PW:         1.40 cm  LV E/e' medial:  11.5 LV IVS:        1.10 cm         LV e' lateral:   13.20 cm/s LVOT diam:     2.60 cm         LV E/e' lateral: 10.2 LV SV:         111 LV SV Index:   51 LVOT Area:     5.31 cm        3D Volume EF LV 3D EF:    Left ventricul ar ejection fraction by 3D volume is 67 %.  3D Volume EF: 3D EF:        67 % LV EDV:       150 ml LV ESV:       49 ml LV SV:        101 ml  RIGHT VENTRICLE RV Basal diam:  3.50 cm RV S prime:     14.80 cm/s TAPSE (M-mode): 2.5 cm RVSP:           43.7 mmHg  LEFT ATRIUM              Index        RIGHT ATRIUM           Index LA diam:        3.60 cm  1.66 cm/m   RA Pressure: 3.00 mmHg LA Vol (A2C):   109.0 ml 50.24 ml/m  RA Area:     21.00 cm LA Vol (A4C):   133.0 ml 61.31 ml/m  RA Volume:   68.90 ml  31.76 ml/m LA Biplane Vol: 129.0 ml 59.46 ml/m AORTIC VALVE                     PULMONIC VALVE AV Area (Vmax):    1.48 cm      PV Vmax:       2.34 m/s AV Area (Vmean):    1.33 cm      PV Vmean:      157.600 cm/s AV Area (VTI):     1.06 cm      PV VTI:        0.580 m AV Vmax:           383.25 cm/s   PV Peak grad:  22.0 mmHg AV Vmean:          269.250 cm/s  PV Mean grad:  11.8 mmHg AV VTI:            1.045 m AV Peak Grad:      58.8 mmHg AV Mean Grad:      34.0 mmHg LVOT Vmax:         107.00 cm/s LVOT Vmean:        67.400 cm/s LVOT VTI:          0.209 m LVOT/AV VTI ratio: 0.20 AI PHT:            613 msec  AORTA Ao Root diam: 3.20 cm Ao Asc diam:  3.80 cm  MITRAL VALVE                TRICUSPID VALVE MV Area (PHT)  cm          TR Peak grad:   40.7 mmHg MV Decel Time: 224 msec     TR Vmax:        319.00 cm/s MV E velocity: 134.50 cm/s  Estimated RAP:  3.00 mmHg MV A velocity: 91.80  cm/s   RVSP:           43.7 mmHg MV E/A ratio:  1.47 SHUNTS Systemic VTI:  0.21 m Systemic Diam: 2.60 cm  Morene Brownie Electronically signed by Morene Brownie Signature Date/Time: 05/14/2024/5:58:12 PM    Final (Updated)          ______________________________________________________________________________________________      Risk Assessment/Calculations           Physical Exam VS:  BP 124/78 (BP Location: Left Arm, Patient Position: Sitting, Cuff Size: Large)   Pulse 69   Ht 5' 8 (1.727 m)   Wt 234 lb 1.6 oz (106.2 kg)   LMP 11/24/2011   SpO2 96%   BMI 35.59 kg/m        Wt Readings from Last 3 Encounters:  05/23/24 234 lb 1.6 oz (106.2 kg)  05/19/24 238 lb 11.2 oz (108.3 kg)  03/14/24 237 lb 9.6 oz (107.8 kg)    GEN: Well nourished, well developed in no acute distress NECK: No JVD; No carotid bruits CARDIAC: RRR, no murmurs, rubs, gallops RESPIRATORY:  Clear to auscultation without rales, wheezing or rhonchi  ABDOMEN: Soft, non-tender, non-distended EXTREMITIES:  No edema; No deformity   ASSESSMENT AND PLAN  Preop clearance - Pending knee surgery. According to the Revised Cardiac Risk Index (RCRI), her Perioperative Risk of Major  Cardiac Event is (%): 0.4. Her Functional Capacity in METs is: 5.07 according to the Duke Activity Status Index (DASI).  Per AHA/ACC guidelines, she is deemed acceptable risk for the planned procedure without additional cardiovascular testing. Will route to surgical team so they are aware.   She is already holding Aspirin  5 days prior to procedure.   Severe MR / Moderate AI - Echo 05/14/24 normal LVEF 60 to 65%, no RWMA, normal diastolic parameters, RV normal, mildly elevated PASP, severe LAE, severe mitral regurgitation with no mitral stenosis (large eccentric anteriorly directed jet that wraps the left atrium with significant splaying), mild to moderate AI, tricuspid aortic valve, mild to moderate pulmonic stenosis. She is asymptomatic in regards to her MR/AI. No evidence of heart failure. Discussed with Dr. Raford as DOD in clinic. As asymptomatic, may proceed with knee surgery as above. We will plan for TEE and consult to valve team after knee surgery. Will call her 2 weeks after knee surgery to see how she is recovering and schedule TEE.    Informed Consent   Shared Decision Making/Informed Consent   The risks [esophageal damage, perforation (1:10,000 risk), bleeding, pharyngeal hematoma as well as other potential complications associated with conscious sedation including aspiration, arrhythmia, respiratory failure and death], benefits (treatment guidance and diagnostic support) and alternatives of a transesophageal echocardiogram were discussed in detail with Ms. Casserly and she is willing to proceed.      Dispo: follow up with valve team post TEE  Signed, Reche GORMAN Finder, NP

## 2024-05-23 NOTE — Progress Notes (Addendum)
 Anesthesia Chart Review:  63 year old female follows with cardiology for history of mitral regurgitation, aortic insufficiency, pulmonic stenosis, HTN, hemorrhagic stroke 2014, HLD.  Recent echo 05/14/2024 showed EF 60 to 65%, normal RV, mildly elevated PASP 43.7 mmHg, severely dilated LA, severe mitral regurgitation, mild aortic regurgitation, mild to moderate pulmonic stenosis.  Seen by Reche Finder, NP on 05/23/2024 for preop evaluation.  Per note, Discussed with Dr. Raford as DOD in clinic. As asymptomatic, may proceed with knee surgery as above. We will plan for TEE and consult to valve team after knee surgery. Will call her 2 weeks after knee surgery to see how she is recovering and schedule TEE.SABRASABRA According to the Revised Cardiac Risk Index (RCRI), her Perioperative Risk of Major Cardiac Event is (%): 0.4. Her Functional Capacity in METs is: 5.07 according to the Duke Activity Status Index (DASI).  Per AHA/ACC guidelines, she is deemed acceptable risk for the planned procedure without additional cardiovascular testing. Will route to surgical team so they are aware.   She is already holding Aspirin  5 days prior to procedure   Other pertinent history includes seizures well-controlled on Keppra , chronic headaches, tremor.  Preop labs reviewed, WNL.  EKG 05/19/2024: NSR. Rate 62.  TTE 05/14/2024:  1. Left ventricular ejection fraction, by estimation, is 60 to 65%. Left  ventricular ejection fraction by 3D volume is 67 %. The left ventricle has  normal function. The left ventricle has no regional wall motion  abnormalities. Left ventricular diastolic   parameters were normal.   2. Right ventricular systolic function is normal. The right ventricular  size is normal. There is mildly elevated pulmonary artery systolic  pressure. The estimated right ventricular systolic pressure is 43.7 mmHg.   3. Left atrial size was severely dilated.   4. Large, eccentric, anteriorally directed jet that wraps  the left atrium  with significant splay. Suspect likely severe, TEE may be helpful for  further characterization. The mitral valve is normal in structure. Severe  mitral valve regurgitation. No  evidence of mitral stenosis.   5. Increased aortic valve gradients measured, but the valve appears thin,  no calcifications, and opens well. Suspect measured gradient from  eccentric MR along CW interrogation line. The aortic valve is tricuspid.  Aortic valve regurgitation is mild to  moderate. No aortic stenosis is present.   6. 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   7. The inferior vena cava is normal in size with greater than 50%  respiratory variability, suggesting right atrial pressure of 3 mmHg.      Lynwood Geofm RIGGERS [facility] Short Stay Center/Anesthesiology Phone [phone] 05/23/2024 1:03 PM

## 2024-05-23 NOTE — Patient Instructions (Signed)
 Medication Instructions:   Your physician recommends that you continue on your current medications as directed. Please refer to the Current Medication list given to you today.  You can stay off your Asprin till surgery.   *If you need a refill on your cardiac medications before your next appointment, please call your pharmacy*  Lab Work:  None ordered.  If you have labs (blood work) drawn today and your tests are completely normal, you will receive your results only by: MyChart Message (if you have MyChart) OR A paper copy in the mail If you have any lab test that is abnormal or we need to change your treatment, we will call you to review the results.  Testing/Procedures:  None ordered.   Follow-Up: At [facility], you and your health needs are our priority.  As part of our continuing mission to provide you with exceptional heart care, our providers are all part of one team.  This team includes your primary Cardiologist (physician) and Advanced Practice Providers or APPs (Physician Assistants and Nurse Practitioners) who all work together to provide you with the care you need, when you need it.  Your next appointment:   TBD    Provider:   Reche Finder, NP    We recommend signing up for the patient portal called MyChart.  Sign up information is provided on this After Visit Summary.  MyChart is used to connect with patients for Virtual Visits (Telemedicine).  Patients are able to view lab/test results, encounter notes, upcoming appointments, etc.  Non-urgent messages can be sent to your provider as well.   To learn more about what you can do with MyChart, go to [email or web address].   Other Instructions  The office will be in touch with you in 2 weeks to determined your next steps.

## 2024-05-23 NOTE — Progress Notes (Signed)
 Pt aware of new arrival time 0600, ERAS 0530 for surgery Monday.

## 2024-05-26 ENCOUNTER — Other Ambulatory Visit: Payer: Self-pay

## 2024-05-26 ENCOUNTER — Encounter (HOSPITAL_COMMUNITY)
Admission: RE | Disposition: A | Discharge status: Home / Self Care | Payer: Self-pay | Source: Home / Self Care | Attending: Orthopaedic Surgery

## 2024-05-26 ENCOUNTER — Ambulatory Visit (HOSPITAL_COMMUNITY): Payer: Self-pay | Admitting: Physician Assistant

## 2024-05-26 ENCOUNTER — Encounter (HOSPITAL_COMMUNITY): Payer: Self-pay | Admitting: Orthopaedic Surgery

## 2024-05-26 ENCOUNTER — Observation Stay (HOSPITAL_COMMUNITY)
Admission: RE | Admit: 2024-05-26 | Discharge: 2024-05-27 | Disposition: A | Discharge status: Home / Self Care | Attending: Orthopaedic Surgery | Admitting: Orthopaedic Surgery

## 2024-05-26 ENCOUNTER — Other Ambulatory Visit: Payer: Self-pay | Admitting: Physician Assistant

## 2024-05-26 ENCOUNTER — Observation Stay (HOSPITAL_COMMUNITY)

## 2024-05-26 DIAGNOSIS — I340 Nonrheumatic mitral (valve) insufficiency: Secondary | ICD-10-CM | POA: Diagnosis not present

## 2024-05-26 DIAGNOSIS — M1712 Unilateral primary osteoarthritis, left knee: Secondary | ICD-10-CM

## 2024-05-26 DIAGNOSIS — I10 Essential (primary) hypertension: Secondary | ICD-10-CM

## 2024-05-26 DIAGNOSIS — Z7982 Long term (current) use of aspirin: Secondary | ICD-10-CM | POA: Insufficient documentation

## 2024-05-26 DIAGNOSIS — I679 Cerebrovascular disease, unspecified: Secondary | ICD-10-CM

## 2024-05-26 DIAGNOSIS — Z96652 Presence of left artificial knee joint: Secondary | ICD-10-CM | POA: Insufficient documentation

## 2024-05-26 HISTORY — PX: TOTAL KNEE ARTHROPLASTY: SHX125

## 2024-05-26 SURGERY — ARTHROPLASTY, KNEE, TOTAL
Anesthesia: Monitor Anesthesia Care | Site: Knee | Laterality: Left

## 2024-05-26 MED ORDER — METHOCARBAMOL 500 MG PO TABS
500.0000 mg | ORAL_TABLET | Freq: Four times a day (QID) | ORAL | Status: DC | PRN
  Administered 2024-05-26 (×2): 500 mg via ORAL
  Filled 2024-05-26 (×2): qty 1

## 2024-05-26 MED ORDER — HYDROMORPHONE HCL 1 MG/ML IJ SOLN: 1.0000 mg | Freq: Three times a day (TID) | INTRAMUSCULAR | Status: DC | PRN

## 2024-05-26 MED ORDER — PROPOFOL 500 MG/50ML IV EMUL
INTRAVENOUS | Status: DC | PRN
  Administered 2024-05-26: 100 ug/kg/min via INTRAVENOUS

## 2024-05-26 MED ORDER — TRANEXAMIC ACID-NACL 1000-0.7 MG/100ML-% IV SOLN
1000.0000 mg | INTRAVENOUS | Status: AC
  Administered 2024-05-26: 1000 mg via INTRAVENOUS
  Filled 2024-05-26: qty 100

## 2024-05-26 MED ORDER — ONDANSETRON HCL 4 MG/2ML IJ SOLN
4.0000 mg | Freq: Four times a day (QID) | INTRAMUSCULAR | Status: DC | PRN
  Administered 2024-05-26: 4 mg via INTRAVENOUS
  Filled 2024-05-26: qty 2

## 2024-05-26 MED ORDER — FENTANYL CITRATE (PF) 100 MCG/2ML IJ SOLN
INTRAMUSCULAR | Status: AC
  Filled 2024-05-26: qty 2

## 2024-05-26 MED ORDER — PROPOFOL 1000 MG/100ML IV EMUL
INTRAVENOUS | Status: AC
  Filled 2024-05-26: qty 100

## 2024-05-26 MED ORDER — BUPIVACAINE-MELOXICAM ER 400-12 MG/14ML IJ SOLN
INTRAMUSCULAR | Status: DC | PRN
  Administered 2024-05-26: 400 mg

## 2024-05-26 MED ORDER — ONDANSETRON HCL 4 MG PO TABS
4.0000 mg | ORAL_TABLET | Freq: Four times a day (QID) | ORAL | Status: DC | PRN
  Administered 2024-05-27: 4 mg via ORAL
  Filled 2024-05-26: qty 1

## 2024-05-26 MED ORDER — SODIUM CHLORIDE 0.9 % IR SOLN
Status: DC | PRN
  Administered 2024-05-26: 3000 mL

## 2024-05-26 MED ORDER — DEXAMETHASONE SODIUM PHOSPHATE 10 MG/ML IJ SOLN
10.0000 mg | Freq: Once | INTRAMUSCULAR | Status: AC
  Administered 2024-05-27: 10 mg via INTRAVENOUS
  Filled 2024-05-26: qty 1

## 2024-05-26 MED ORDER — BUPIVACAINE IN DEXTROSE 0.75-8.25 % IT SOLN
INTRATHECAL | Status: DC | PRN
  Administered 2024-05-26: 2 mL via INTRATHECAL

## 2024-05-26 MED ORDER — PHENOL 1.4 % MT LIQD: 1.0000 | OROMUCOSAL | Status: DC | PRN

## 2024-05-26 MED ORDER — PROPOFOL 10 MG/ML IV BOLUS
INTRAVENOUS | Status: AC
  Filled 2024-05-26: qty 20

## 2024-05-26 MED ORDER — CEFAZOLIN SODIUM-DEXTROSE 2-4 GM/100ML-% IV SOLN
2.0000 g | Freq: Four times a day (QID) | INTRAVENOUS | Status: AC
  Administered 2024-05-26 (×2): 2 g via INTRAVENOUS
  Filled 2024-05-26 (×2): qty 100

## 2024-05-26 MED ORDER — METOCLOPRAMIDE HCL 5 MG PO TABS: 5.0000 mg | ORAL_TABLET | Freq: Three times a day (TID) | ORAL | Status: DC | PRN

## 2024-05-26 MED ORDER — ONDANSETRON HCL 4 MG/2ML IJ SOLN
INTRAMUSCULAR | Status: AC
  Filled 2024-05-26: qty 2

## 2024-05-26 MED ORDER — POVIDONE-IODINE 10 % EX SWAB
2.0000 | Freq: Once | CUTANEOUS | Status: AC
  Administered 2024-05-26: 2 via TOPICAL

## 2024-05-26 MED ORDER — FENTANYL CITRATE (PF) 100 MCG/2ML IJ SOLN
25.0000 ug | INTRAMUSCULAR | Status: DC | PRN
  Administered 2024-05-26: 50 ug via INTRAVENOUS

## 2024-05-26 MED ORDER — FENTANYL CITRATE (PF) 250 MCG/5ML IJ SOLN
INTRAMUSCULAR | Status: DC | PRN
  Administered 2024-05-26 (×2): 50 ug via INTRAVENOUS

## 2024-05-26 MED ORDER — OXYCODONE HCL 5 MG/5ML PO SOLN: 5.0000 mg | Freq: Once | ORAL | Status: DC | PRN

## 2024-05-26 MED ORDER — ORAL CARE MOUTH RINSE: 15.0000 mL | Freq: Once | OROMUCOSAL | Status: AC

## 2024-05-26 MED ORDER — PHENYLEPHRINE HCL-NACL 20-0.9 MG/250ML-% IV SOLN
INTRAVENOUS | Status: DC | PRN
  Administered 2024-05-26: 20 ug/min via INTRAVENOUS

## 2024-05-26 MED ORDER — VANCOMYCIN HCL 1000 MG IV SOLR
INTRAVENOUS | Status: AC
  Filled 2024-05-26: qty 20

## 2024-05-26 MED ORDER — OXYCODONE HCL 5 MG PO TABS: 5.0000 mg | ORAL_TABLET | Freq: Once | ORAL | Status: DC | PRN

## 2024-05-26 MED ORDER — OXYCODONE HCL 5 MG PO TABS
10.0000 mg | ORAL_TABLET | Freq: Four times a day (QID) | ORAL | Status: DC | PRN
  Administered 2024-05-26 – 2024-05-27 (×3): 10 mg via ORAL
  Filled 2024-05-26 (×3): qty 2

## 2024-05-26 MED ORDER — LEVETIRACETAM ER 500 MG PO TB24
500.0000 mg | ORAL_TABLET | Freq: Every day | ORAL | Status: DC
  Administered 2024-05-26: 500 mg via ORAL
  Filled 2024-05-26 (×2): qty 1

## 2024-05-26 MED ORDER — DOCUSATE SODIUM 100 MG PO CAPS
100.0000 mg | ORAL_CAPSULE | Freq: Two times a day (BID) | ORAL | Status: DC
  Administered 2024-05-26 (×2): 100 mg via ORAL
  Filled 2024-05-26 (×2): qty 1

## 2024-05-26 MED ORDER — APIXABAN 2.5 MG PO TABS
ORAL_TABLET | ORAL | 0 refills | Status: DC
Start: 2024-05-26 — End: 2024-05-27

## 2024-05-26 MED ORDER — KETOROLAC TROMETHAMINE 15 MG/ML IJ SOLN
7.5000 mg | Freq: Four times a day (QID) | INTRAMUSCULAR | Status: AC
  Administered 2024-05-26 – 2024-05-27 (×4): 7.5 mg via INTRAVENOUS
  Filled 2024-05-26 (×4): qty 1

## 2024-05-26 MED ORDER — MEPERIDINE HCL 25 MG/ML IJ SOLN: 6.2500 mg | INTRAMUSCULAR | Status: DC | PRN

## 2024-05-26 MED ORDER — CEFAZOLIN SODIUM-DEXTROSE 2-4 GM/100ML-% IV SOLN
2.0000 g | INTRAVENOUS | Status: DC
  Filled 2024-05-26: qty 100

## 2024-05-26 MED ORDER — METHOCARBAMOL 1000 MG/10ML IJ SOLN: 500.0000 mg | Freq: Four times a day (QID) | INTRAMUSCULAR | Status: DC | PRN

## 2024-05-26 MED ORDER — VANCOMYCIN HCL 1000 MG IV SOLR
INTRAVENOUS | Status: DC | PRN
  Administered 2024-05-26: 1000 mg

## 2024-05-26 MED ORDER — LACTATED RINGERS IV SOLN: INTRAVENOUS | Status: DC

## 2024-05-26 MED ORDER — ACETAMINOPHEN 325 MG PO TABS: 325.0000 mg | ORAL_TABLET | Freq: Four times a day (QID) | ORAL | Status: DC | PRN

## 2024-05-26 MED ORDER — ALBUMIN HUMAN 5 % IV SOLN: INTRAVENOUS | Status: DC | PRN

## 2024-05-26 MED ORDER — BUPIVACAINE-EPINEPHRINE (PF) 0.5% -1:200000 IJ SOLN
INTRAMUSCULAR | Status: DC | PRN
  Administered 2024-05-26: 25 mL via PERINEURAL

## 2024-05-26 MED ORDER — METOCLOPRAMIDE HCL 5 MG/ML IJ SOLN: 5.0000 mg | Freq: Three times a day (TID) | INTRAMUSCULAR | Status: DC | PRN

## 2024-05-26 MED ORDER — 0.9 % SODIUM CHLORIDE (POUR BTL) OPTIME
TOPICAL | Status: DC | PRN
  Administered 2024-05-26: 1000 mL

## 2024-05-26 MED ORDER — TRANEXAMIC ACID-NACL 1000-0.7 MG/100ML-% IV SOLN
1000.0000 mg | Freq: Once | INTRAVENOUS | Status: AC
  Administered 2024-05-26: 1000 mg via INTRAVENOUS
  Filled 2024-05-26: qty 100

## 2024-05-26 MED ORDER — PRONTOSAN WOUND IRRIGATION OPTIME
TOPICAL | Status: DC | PRN
Start: 2024-05-26 — End: 2024-05-26
  Administered 2024-05-26: 1

## 2024-05-26 MED ORDER — MIDAZOLAM HCL 2 MG/2ML IJ SOLN
INTRAMUSCULAR | Status: DC | PRN
  Administered 2024-05-26: 2 mg via INTRAVENOUS

## 2024-05-26 MED ORDER — MENTHOL 3 MG MT LOZG: 1.0000 | LOZENGE | OROMUCOSAL | Status: DC | PRN

## 2024-05-26 MED ORDER — PHENYLEPHRINE 80 MCG/ML (10ML) SYRINGE FOR IV PUSH (FOR BLOOD PRESSURE SUPPORT)
PREFILLED_SYRINGE | INTRAVENOUS | Status: DC | PRN
  Administered 2024-05-26: 40 ug via INTRAVENOUS
  Administered 2024-05-26: 80 ug via INTRAVENOUS

## 2024-05-26 MED ORDER — ONDANSETRON HCL 4 MG/2ML IJ SOLN: 4.0000 mg | Freq: Once | INTRAMUSCULAR | Status: DC | PRN

## 2024-05-26 MED ORDER — OXYCODONE HCL 5 MG PO TABS: 5.0000 mg | ORAL_TABLET | Freq: Four times a day (QID) | ORAL | Status: DC | PRN

## 2024-05-26 MED ORDER — BUPIVACAINE-MELOXICAM ER 400-12 MG/14ML IJ SOLN
INTRAMUSCULAR | Status: AC
  Filled 2024-05-26: qty 1

## 2024-05-26 MED ORDER — SODIUM CHLORIDE 0.9 % IV SOLN: INTRAVENOUS | Status: DC

## 2024-05-26 MED ORDER — APIXABAN 2.5 MG PO TABS
2.5000 mg | ORAL_TABLET | Freq: Two times a day (BID) | ORAL | Status: DC
  Administered 2024-05-27: 2.5 mg via ORAL
  Filled 2024-05-26 (×2): qty 1

## 2024-05-26 MED ORDER — VASOPRESSIN 20 UNIT/ML IV SOLN
INTRAVENOUS | Status: DC | PRN
  Administered 2024-05-26 (×5): 1 [IU] via INTRAVENOUS

## 2024-05-26 MED ORDER — DEXAMETHASONE SODIUM PHOSPHATE 10 MG/ML IJ SOLN
INTRAMUSCULAR | Status: AC
Start: 2024-05-26 — End: 2024-05-26
  Filled 2024-05-26: qty 1

## 2024-05-26 MED ORDER — CHLORHEXIDINE GLUCONATE 0.12 % MT SOLN
15.0000 mL | Freq: Once | OROMUCOSAL | Status: AC
  Administered 2024-05-26: 15 mL via OROMUCOSAL
  Filled 2024-05-26: qty 15

## 2024-05-26 MED ORDER — LISINOPRIL-HYDROCHLOROTHIAZIDE 20-25 MG PO TABS: 1.0000 | ORAL_TABLET | Freq: Every day | ORAL | Status: DC

## 2024-05-26 MED ORDER — TRANEXAMIC ACID 1000 MG/10ML IV SOLN
INTRAVENOUS | Status: DC | PRN
  Administered 2024-05-26: 2000 mg via TOPICAL

## 2024-05-26 MED ORDER — LISINOPRIL 20 MG PO TABS
20.0000 mg | ORAL_TABLET | Freq: Every day | ORAL | Status: DC
  Administered 2024-05-26: 20 mg via ORAL
  Filled 2024-05-26: qty 1

## 2024-05-26 MED ORDER — CEFAZOLIN SODIUM-DEXTROSE 2-3 GM-%(50ML) IV SOLR
INTRAVENOUS | Status: DC | PRN
  Administered 2024-05-26: 2 g via INTRAVENOUS

## 2024-05-26 MED ORDER — ACETAMINOPHEN 500 MG PO TABS
1000.0000 mg | ORAL_TABLET | Freq: Four times a day (QID) | ORAL | Status: AC
  Administered 2024-05-26 – 2024-05-27 (×4): 1000 mg via ORAL
  Filled 2024-05-26 (×4): qty 2

## 2024-05-26 MED ORDER — MIDAZOLAM HCL 2 MG/2ML IJ SOLN
INTRAMUSCULAR | Status: AC
Start: 2024-05-26 — End: 2024-05-26
  Filled 2024-05-26: qty 2

## 2024-05-26 MED ORDER — HYDROCHLOROTHIAZIDE 25 MG PO TABS
25.0000 mg | ORAL_TABLET | Freq: Every day | ORAL | Status: DC
  Administered 2024-05-26: 25 mg via ORAL
  Filled 2024-05-26: qty 1

## 2024-05-26 SURGICAL SUPPLY — 70 items
ALCOHOL 70% 16 OZ (MISCELLANEOUS) ×1 IMPLANT
BAG COUNTER SPONGE SURGICOUNT (BAG) IMPLANT
BAG DECANTER FOR FLEXI CONT (MISCELLANEOUS) ×1 IMPLANT
BANDAGE ESMARK 6X9 LF (GAUZE/BANDAGES/DRESSINGS) IMPLANT
BLADE SAG 18X100X1.27 (BLADE) ×1 IMPLANT
BLADE SAW SGTL 73X25 THK (BLADE) ×1 IMPLANT
BOWL SMART MIX CTS (DISPOSABLE) ×1 IMPLANT
CEMENT BONE REFOBACIN R1X40 US (Cement) IMPLANT
CLSR STERI-STRIP ANTIMIC 1/2X4 (GAUZE/BANDAGES/DRESSINGS) IMPLANT
COMPONET TIB PS KNEE E 0D LT (Joint) IMPLANT
COOLER ICEMAN CLASSIC (MISCELLANEOUS) ×1 IMPLANT
COVER SURGICAL LIGHT HANDLE (MISCELLANEOUS) ×1 IMPLANT
CUFF TOURN SGL QUICK 42 (TOURNIQUET CUFF) IMPLANT
CUFF TRNQT CYL 34X4.125X (TOURNIQUET CUFF) ×1 IMPLANT
DERMABOND ADVANCED .7 DNX12 (GAUZE/BANDAGES/DRESSINGS) ×1 IMPLANT
DRAPE EXTREMITY T 121X128X90 (DISPOSABLE) ×1 IMPLANT
DRAPE HALF SHEET 40X57 (DRAPES) ×1 IMPLANT
DRAPE INCISE IOBAN 66X45 STRL (DRAPES) ×1 IMPLANT
DRAPE POUCH INSTRU U-SHP 10X18 (DRAPES) ×1 IMPLANT
DRAPE SURG ORHT 6 SPLT 77X108 (DRAPES) IMPLANT
DRAPE U-SHAPE 47X51 STRL (DRAPES) ×2 IMPLANT
DRSG AQUACEL AG ADV 3.5X10 (GAUZE/BANDAGES/DRESSINGS) ×1 IMPLANT
DURAPREP 26ML APPLICATOR (WOUND CARE) ×3 IMPLANT
ELECT CAUTERY BLADE 6.4 (BLADE) ×1 IMPLANT
ELECT PENCIL ROCKER SW 15FT (MISCELLANEOUS) ×1 IMPLANT
ELECTRODE REM PT RTRN 9FT ADLT (ELECTROSURGICAL) ×1 IMPLANT
FEMORAL KNEE COMP SZ 8STD LT (Knees) IMPLANT
GLOVE BIOGEL PI IND STRL 7.0 (GLOVE) ×2 IMPLANT
GLOVE BIOGEL PI IND STRL 7.5 (GLOVE) ×5 IMPLANT
GLOVE ECLIPSE 7.0 STRL STRAW (GLOVE) ×3 IMPLANT
GLOVE INDICATOR 7.0 STRL GRN (GLOVE) ×1 IMPLANT
GLOVE INDICATOR 7.5 STRL GRN (GLOVE) ×1 IMPLANT
GLOVE SURG SYN 7.5 PF PI (GLOVE) ×2 IMPLANT
GLOVE SURG UNDER LTX SZ7.5 (GLOVE) ×2 IMPLANT
GLOVE SURG UNDER POLY LF SZ7 (GLOVE) ×2 IMPLANT
GOWN STRL REUS W/ TWL LRG LVL3 (GOWN DISPOSABLE) ×1 IMPLANT
GOWN STRL SURGICAL XL XLNG (GOWN DISPOSABLE) IMPLANT
GOWN TOGA ZIPPER T7+ PEEL AWAY (MISCELLANEOUS) ×1 IMPLANT
HOOD PEEL AWAY T7 (MISCELLANEOUS) ×1 IMPLANT
KIT BASIN OR (CUSTOM PROCEDURE TRAY) ×1 IMPLANT
KIT TURNOVER KIT B (KITS) ×1 IMPLANT
MANIFOLD NEPTUNE II (INSTRUMENTS) ×1 IMPLANT
MARKER SKIN DUAL TIP RULER LAB (MISCELLANEOUS) ×2 IMPLANT
NDL SPNL 18GX3.5 QUINCKE PK (NEEDLE) ×1 IMPLANT
NEEDLE SPNL 18GX3.5 QUINCKE PK (NEEDLE) ×1 IMPLANT
NS IRRIG 1000ML POUR BTL (IV SOLUTION) ×1 IMPLANT
PACK TOTAL JOINT (CUSTOM PROCEDURE TRAY) ×1 IMPLANT
PAD ARMBOARD POSITIONER FOAM (MISCELLANEOUS) ×2 IMPLANT
PAD COLD SHLDR WRAP-ON (PAD) ×1 IMPLANT
PIN DRILL HDLS TROCAR 75 4PK (PIN) IMPLANT
SCREW FEMALE HEX FIX 25X2.5 (ORTHOPEDIC DISPOSABLE SUPPLIES) IMPLANT
SET HNDPC FAN SPRY TIP SCT (DISPOSABLE) ×1 IMPLANT
SOLUTION PRONTOSAN WOUND 350ML (IRRIGATION / IRRIGATOR) ×1 IMPLANT
STAPLER SKIN PROX 35W (STAPLE) IMPLANT
STEM ARTISURF EF 12 SZ8-11 (Stem) IMPLANT
STEM POLY PAT PLY 35M KNEE (Knees) IMPLANT
SUCTION TUBE FRAZIER 10FR DISP (SUCTIONS) IMPLANT
SUT ETHILON 2 0 FS 18 (SUTURE) IMPLANT
SUT MNCRL AB 3-0 PS2 27 (SUTURE) IMPLANT
SUT STRATAFIX PDS+ 0 24IN (SUTURE) IMPLANT
SUT VIC AB 0 CT1 27XBRD ANBCTR (SUTURE) ×2 IMPLANT
SUT VIC AB 1 CTX 27 (SUTURE) ×3 IMPLANT
SUT VIC AB 2-0 CT1 TAPERPNT 27 (SUTURE) ×4 IMPLANT
SYR 50ML LL SCALE MARK (SYRINGE) ×2 IMPLANT
TOWEL GREEN STERILE (TOWEL DISPOSABLE) ×1 IMPLANT
TOWEL GREEN STERILE FF (TOWEL DISPOSABLE) ×1 IMPLANT
TRAY CATH INTERMITTENT SS 16FR (CATHETERS) IMPLANT
TUBE SUCT ARGYLE STRL (TUBING) ×1 IMPLANT
UNDERPAD 30X36 HEAVY ABSORB (UNDERPADS AND DIAPERS) ×1 IMPLANT
YANKAUER SUCT BULB TIP NO VENT (SUCTIONS) ×1 IMPLANT

## 2024-05-26 NOTE — Anesthesia Postprocedure Evaluation (Signed)
 Anesthesia Post Note  Patient: Yeva Bissette  Procedure(s) Performed: LEFT TOTAL KNEE ARTHROPLASTY (Left: Knee)     Patient location during evaluation: PACU Anesthesia Type: Regional, MAC and Spinal Level of consciousness: oriented and awake and alert Pain management: pain level controlled Vital Signs Assessment: post-procedure vital signs reviewed and stable Respiratory status: spontaneous breathing, respiratory function stable and patient connected to nasal cannula oxygen Cardiovascular status: blood pressure returned to baseline and stable Postop Assessment: no headache, no backache and no apparent nausea or vomiting Anesthetic complications: no   There were no known notable events for this encounter.  Last Vitals:  Vitals:   05/26/24 1115 05/26/24 1130  BP: 105/75 106/66  Pulse: (!) 54 (!) 52  Resp: 15 16  Temp:    SpO2: 100% 99%    Last Pain:  Vitals:   05/26/24 1145  TempSrc:   PainSc: 2                  Madylyn Insco

## 2024-05-26 NOTE — Anesthesia Procedure Notes (Signed)
 Anesthesia Regional Block: Adductor canal block   Pre-Anesthetic Checklist: , timeout performed,  Correct Patient, Correct Site, Correct Laterality,  Correct Procedure, Correct Position, site marked,  Risks and benefits discussed,  Surgical consent,  Pre-op evaluation,  At surgeon's request and post-op pain management  Laterality: Left  Prep: chloraprep       Needles:  Injection technique: Single-shot  Needle Type: Echogenic Stimulator Needle     Needle Length: 5cm  Needle Gauge: 22     Additional Needles:   Procedures:,,,, ultrasound used (permanent image in chart),,    Narrative:  Start time: 05/26/2024 7:40 AM End time: 05/26/2024 7:45 AM Injection made incrementally with aspirations every 5 mL.  Performed by: Personally  Anesthesiologist: Mallory Manus, MD  Additional Notes: Functioning IV was confirmed and monitors were applied.  A 50mm 22ga Arrow echogenic stimulator needle was used. Sterile prep and drape,hand hygiene and sterile gloves were used. Ultrasound guidance: relevant anatomy identified, needle position confirmed, local anesthetic spread visualized around nerve(s)., vascular puncture avoided.  Image printed for medical record. Negative aspiration and negative test dose prior to incremental administration of local anesthetic. The patient tolerated the procedure well.

## 2024-05-26 NOTE — Plan of Care (Signed)

## 2024-05-26 NOTE — Anesthesia Procedure Notes (Signed)
 Spinal  Patient location during procedure: OR Start time: 05/26/2024 8:55 AM Reason for block: surgical anesthesia Staffing Anesthesiologist: Mallory Manus, MD Performed by: Mallory Manus, MD Authorized by: Mallory Manus, MD   Preanesthetic Checklist Completed: patient identified, IV checked, site marked, risks and benefits discussed, surgical consent, monitors and equipment checked, pre-op evaluation and timeout performed Spinal Block Patient position: sitting Prep: DuraPrep Patient monitoring: heart rate, cardiac monitor, continuous pulse ox and blood pressure Approach: midline Location: L3-4 Injection technique: single-shot Needle Needle type: Sprotte  Needle gauge: 24 G Needle length: 9 cm Assessment Sensory level: T4 Events: CSF return

## 2024-05-26 NOTE — Op Note (Signed)
 Total Knee Arthroplasty Procedure Note  Preoperative diagnosis: Left knee osteoarthritis  Postoperative diagnosis:same  Operative findings: Complete loss articular cartilage from all 3 compartments Mild valgus deformity  Operative procedure: Left total knee arthroplasty. CPT [phone]  Surgeon: N. Ozell Cummins, MD  Assist: Ronal Morna Grave, PA-C; necessary for the timely completion of procedure and due to complexity of procedure.  Anesthesia: Spinal, regional, local  Tourniquet time: see anesthesia record  Implants used: Zimmer persona  Femur: CR 8 Tibia: E Patella: 35 mm Polyethylene: 12 mm medial congruent  Indication: Alison Ward is a 63 y.o. year old female with a history of knee pain. Having failed conservative management, the patient elected to proceed with a total knee arthroplasty.  We have reviewed the risk and benefits of the surgery and they elected to proceed after voicing understanding.  Procedure:  After informed consent was obtained and understanding of the risk were voiced including but not limited to bleeding, infection, damage to surrounding structures including nerves and vessels, blood clots, leg length inequality and the failure to achieve desired results, the operative extremity was marked with verbal confirmation of the patient in the holding area.   The patient was then brought to the operating room and transported to the operating room table in the supine position.  A tourniquet was applied to the operative extremity around the upper thigh. The operative limb was then prepped and draped in the usual sterile fashion and preoperative antibiotics were administered.  A time out was performed prior to the start of surgery confirming the correct extremity, preoperative antibiotic administration, as well as team members, implants and instruments available for the case. Correct surgical site was also confirmed with preoperative radiographs. The limb  was then elevated for exsanguination and the tourniquet was inflated. A midline incision was made and a standard medial parapatellar approach was performed.  The infrapatellar fat pad was removed.  Suprapatellar synovium was removed to reveal the anterior distal femoral cortex.  A medial peel was performed to release the capsule and the deep MCL off of the medial tibial plateau back to the semimembranosus.  The patella was then everted which showed complete loss of articular cartilage and was resected down to 15 mm and sized to a 35 mm.  A cover was placed on the patella for protection from retractors.  The knee was then brought into flexion and we then turned our attention to the femur.  The ACL was sacrificed.  Start site was drilled in the femur and the intramedullary distal femoral cutting guide was placed, set at 5 degrees valgus, taking 10 mm of distal resection. The distal cut was made. Osteophytes were then removed.  Next, the proximal tibial cutting guide was placed with appropriate slope, varus/valgus alignment and depth of resection.  The drop rod was attached to confirm that it was aimed at the second metatarsal.  The proximal tibial cut was made taking 2 mm off the lower lateral side. Gap blocks were then used to assess the extension gap and alignment, and appropriate soft tissue releases were performed. Attention was turned back to the femur, which was sized using the sizing guide to a size 8. Appropriate rotation of the femoral component was determined using epicondylar axis, Whiteside's line, and assessing the flexion gap under ligament tension. The appropriate size 4-in-1 cutting block was placed and checked with an angel wing and cuts were made. Posterior femoral osteophytes and uncapped bone were then removed with the curved osteotome.  The  menisci were removed.  Trial components were placed, and stability was checked in full extension, mid-flexion, and deep flexion.  PCL was resected to balance  the flexion space. Proper tibial rotation was determined and marked.  The patella tracked well without a lateral release.  The femoral lugs were then drilled. Trial components were then removed and tibial preparation performed.  The trial tibia was pointed to the medial third of the tibial tubercle.  The tibia was sized for a size E component and prepared.  Trial components were removed.  Sclerotic bone was drilled. The bony surfaces were irrigated with a pulse lavage and then dried. Bone cement was vacuum mixed on the back table, and the final components sized above were cemented into place.  Antibiotic irrigation was placed in the knee joint and soft tissues while the cement cured.  After cement had finished curing, excess cement was removed. The stability of the construct was re-evaluated throughout a range of motion and found to be acceptable. The trial liner was removed, the knee was copiously irrigated, and the knee was re-evaluated for any excess bone debris. The real polyethylene liner, 12 mm thick, was inserted and checked to ensure the locking mechanism had engaged appropriately. The tourniquet was deflated and hemostasis was achieved. The wound was irrigated with normal saline.  One gram of vancomycin  powder was placed in the surgical bed.  Topical mixture of 0.25% bupivacaine  and meloxicam  was placed in the joint for postoperative pain.  Capsular closure was performed with a #1 stratafix in flexion, subcutaneous fat closed with a 0 vicryl suture, then subcutaneous tissue closed with interrupted 2.0 vicryl suture. The skin was then closed with a 2.0 nylon and dermabond. A sterile dressing was applied.  The patient was awakened in the operating room and taken to recovery in stable condition. All sponge, needle, and instrument counts were correct at the end of the case.  Morna Grave was necessary for opening, closing, retracting, limb positioning and overall facilitation and completion of the  surgery.  Position: supine  Complications: none.  Time Out: performed   Drains/Packing: none Estimated blood loss: minimal Returned to Recovery Room: in good condition.   Mechanical VTE (DVT) Prophylaxis: sequential compression devices, TED thigh-high  Chemical VTE (DVT) Prophylaxis: eliquis  POD 1  Fluid Replacement  Crystalloid: see anesthesia record Blood: none  FFP: none   Specimens Removed: 1 to pathology  Sponge and Instrument Count Correct? yes  PACU: portable radiograph - knee AP and Lateral  Plan/RTC: Return in 2 weeks for suture removal.  Weight Bearing/Load Lower Extremity: full   Implant Name Type Inv. Item Serial No. Manufacturer Lot No. LRB No. Used Action  CEMENT BONE REFOBACIN [id number] US  - [id number] Cement CEMENT BONE REFOBACIN [id number] US   ZIMMER RECON(ORTH,TRAU,BIO,SG) [id number] Left 2 Implanted  STEM POLY PAT PLY 28M KNEE - [id number] Knees STEM POLY PAT PLY 28M KNEE  ZIMMER RECON(ORTH,TRAU,BIO,SG) [id number] Left 1 Implanted  FEMORAL KNEE COMP SZ 8STD LT - [id number] Knees FEMORAL KNEE COMP SZ 8STD LT  ZIMMER RECON(ORTH,TRAU,BIO,SG) [id number] Left 1 Implanted  STEM ARTISURF EF 12 SZ8-11 - [id number] Stem STEM ARTISURF EF 12 SZ8-11  ZIMMER RECON(ORTH,TRAU,BIO,SG) [id number] Left 1 Implanted  COMPONET TIB PS KNEE E 0D LT - [id number] Joint COMPONET TIB PS KNEE E 0D LT  ZIMMER [employer]ON(ORTH,TRAU,BIO,SG) [id number] Left 1 Implanted    N. Ozell Cummins, MD [facility] 10:19 AM

## 2024-05-26 NOTE — Discharge Instructions (Signed)
 INSTRUCTIONS AFTER JOINT REPLACEMENT   Remove items at home which could result in a fall. This includes throw rugs or furniture in walking pathways ICE to the affected joint every three hours while awake for 30 minutes at a time, for at least the first 3-5 days, and then as needed for pain and swelling.  Continue to use ice for pain and swelling. You may notice swelling that will progress down to the foot and ankle.  This is normal after surgery.  Elevate your leg when you are not up walking on it.   Continue to use the breathing machine you got in the hospital (incentive spirometer) which will help keep your temperature down.  It is common for your temperature to cycle up and down following surgery, especially at night when you are not up moving around and exerting yourself.  The breathing machine keeps your lungs expanded and your temperature down.   DIET:  As you were doing prior to hospitalization, we recommend a well-balanced diet.  DRESSING / WOUND CARE / SHOWERING  Keep the surgical dressing until follow up.  The dressing is water proof, so you can shower without any extra covering.  IF THE DRESSING FALLS OFF or the wound gets wet inside, change the dressing with sterile gauze.  Please use good hand washing techniques before changing the dressing.  Do not use any lotions or creams on the incision until instructed by your surgeon.    ACTIVITY  Increase activity slowly as tolerated, but follow the weight bearing instructions below.   No driving for 6 weeks or until further direction given by your physician.  You cannot drive while taking narcotics.  No lifting or carrying greater than 10 lbs. until further directed by your surgeon. Avoid periods of inactivity such as sitting longer than an hour when not asleep. This helps prevent blood clots.  You may return to work once you are authorized by your doctor.     WEIGHT BEARING   Weight bearing as tolerated with assist device (walker, cane,  etc) as directed, use it as long as suggested by your surgeon or therapist, typically at least 4-6 weeks.   EXERCISES  Results after joint replacement surgery are often greatly improved when you follow the exercise, range of motion and muscle strengthening exercises prescribed by your doctor. Safety measures are also important to protect the joint from further injury. Any time any of these exercises cause you to have increased pain or swelling, decrease what you are doing until you are comfortable again and then slowly increase them. If you have problems or questions, call your caregiver or physical therapist for advice.   Rehabilitation is important following a joint replacement. After just a few days of immobilization, the muscles of the leg can become weakened and shrink (atrophy).  These exercises are designed to build up the tone and strength of the thigh and leg muscles and to improve motion. Often times heat used for twenty to thirty minutes before working out will loosen up your tissues and help with improving the range of motion but do not use heat for the first two weeks following surgery (sometimes heat can increase post-operative swelling).   These exercises can be done on a training (exercise) mat, on the floor, on a table or on a bed. Use whatever works the best and is most comfortable for you.    Use music or television while you are exercising so that the exercises are a pleasant break in your  day. This will make your life better with the exercises acting as a break in your routine that you can look forward to.   Perform all exercises about fifteen times, three times per day or as directed.  You should exercise both the operative leg and the other leg as well.  Exercises include:   Quad Sets - Tighten up the muscle on the front of the thigh (Quad) and hold for 5-10 seconds.   Straight Leg Raises - With your knee straight (if you were given a brace, keep it on), lift the leg to 60  degrees, hold for 3 seconds, and slowly lower the leg.  Perform this exercise against resistance later as your leg gets stronger.  Leg Slides: Lying on your back, slowly slide your foot toward your buttocks, bending your knee up off the floor (only go as far as is comfortable). Then slowly slide your foot back down until your leg is flat on the floor again.  Angel Wings: Lying on your back spread your legs to the side as far apart as you can without causing discomfort.  Hamstring Strength:  Lying on your back, push your heel against the floor with your leg straight by tightening up the muscles of your buttocks.  Repeat, but this time bend your knee to a comfortable angle, and push your heel against the floor.  You may put a pillow under the heel to make it more comfortable if necessary.   A rehabilitation program following joint replacement surgery can speed recovery and prevent re-injury in the future due to weakened muscles. Contact your doctor or a physical therapist for more information on knee rehabilitation.    CONSTIPATION  Constipation is defined medically as fewer than three stools per week and severe constipation as less than one stool per week.  Even if you have a regular bowel pattern at home, your normal regimen is likely to be disrupted due to multiple reasons following surgery.  Combination of anesthesia, postoperative narcotics, change in appetite and fluid intake all can affect your bowels.   YOU MUST use at least one of the following options; they are listed in order of increasing strength to get the job done.  They are all available over the counter, and you may need to use some, POSSIBLY even all of these options:    Drink plenty of fluids (prune juice may be helpful) and high fiber foods Colace 100 mg by mouth twice a day  Senokot for constipation as directed and as needed Dulcolax (bisacodyl), take with full glass of water  Miralax (polyethylene glycol) once or twice a day as  needed.  If you have tried all these things and are unable to have a bowel movement in the first 3-4 days after surgery call either your surgeon or your primary doctor.    If you experience loose stools or diarrhea, hold the medications until you stool forms back up.  If your symptoms do not get better within 1 week or if they get worse, check with your doctor.  If you experience "the worst abdominal pain ever" or develop nausea or vomiting, please contact the office immediately for further recommendations for treatment.   ITCHING:  If you experience itching with your medications, try taking only a single pain pill, or even half a pain pill at a time.  You can also use Benadryl over the counter for itching or also to help with sleep.   TED HOSE STOCKINGS:  Use stockings on both  legs until for at least 2 weeks or as directed by physician office. They may be removed at night for sleeping.  MEDICATIONS:  See your medication summary on the "After Visit Summary" that nursing will review with you.  You may have some home medications which will be placed on hold until you complete the course of blood thinner medication.  It is important for you to complete the blood thinner medication as prescribed.  PRECAUTIONS:  If you experience chest pain or shortness of breath - call 911 immediately for transfer to the hospital emergency department.   If you develop a fever greater that 101 F, purulent drainage from wound, increased redness or drainage from wound, foul odor from the wound/dressing, or calf pain - CONTACT YOUR SURGEON.                                                   FOLLOW-UP APPOINTMENTS:  If you do not already have a post-op appointment, please call the office for an appointment to be seen by your surgeon.  Guidelines for how soon to be seen are listed in your "After Visit Summary", but are typically between 1-4 weeks after surgery.  OTHER INSTRUCTIONS:   Knee Replacement:  Do not place pillow  under knee, focus on keeping the knee straight while resting. CPM instructions: 0-90 degrees, 2 hours in the morning, 2 hours in the afternoon, and 2 hours in the evening. Place foam block, curve side up under heel at all times except when in CPM or when walking.  DO NOT modify, tear, cut, or change the foam block in any way.  POST-OPERATIVE OPIOID TAPER INSTRUCTIONS: It is important to wean off of your opioid medication as soon as possible. If you do not need pain medication after your surgery it is ok to stop day one. Opioids include: Codeine, Hydrocodone(Norco, Vicodin), Oxycodone(Percocet, oxycontin) and hydromorphone amongst others.  Long term and even short term use of opiods can cause: Increased pain response Dependence Constipation Depression Respiratory depression And more.  Withdrawal symptoms can include Flu like symptoms Nausea, vomiting And more Techniques to manage these symptoms Hydrate well Eat regular healthy meals Stay active Use relaxation techniques(deep breathing, meditating, yoga) Do Not substitute Alcohol to help with tapering If you have been on opioids for less than two weeks and do not have pain than it is ok to stop all together.  Plan to wean off of opioids This plan should start within one week post op of your joint replacement. Maintain the same interval or time between taking each dose and first decrease the dose.  Cut the total daily intake of opioids by one tablet each day Next start to increase the time between doses. The last dose that should be eliminated is the evening dose.   Per [facility] clinic policy, our goal is ensure optimal postoperative pain control with a multimodal pain management strategy. For all [facility] patients, our goal is to wean post-operative narcotic medications by 6 weeks post-operatively. If this is not possible due to utilization of pain medication prior to surgery, your [facility] doctor will support your acute  post-operative pain control for the first 6 weeks postoperatively, with a plan to transition you back to your primary pain team following that. Cyndia Skeeters will work to ensure a Therapist, occupational.   MAKE SURE YOU:  Understand these  instructions.  Get help right away if you are not doing well or get worse.    Thank you for letting us be a part of your medical care team.  It is a privilege we respect greatly.  We hope these instructions will help you stay on track for a fast and full recovery!     ==================================================================  Information on my medicine - ELIQUIS (apixaban)  This medication education was reviewed with me or my healthcare representative as part of my discharge preparation.    Why was Eliquis prescribed for you? Eliquis was prescribed for you to reduce the risk of blood clots forming after orthopedic surgery.    What do You need to know about Eliquis? Take your Eliquis TWICE DAILY - one tablet in the morning and one tablet in the evening with or without food.  It would be best to take the dose about the same time each day.  If you have difficulty swallowing the tablet whole please discuss with your pharmacist how to take the medication safely.  Take Eliquis exactly as prescribed by your doctor and DO NOT stop taking Eliquis without talking to the doctor who prescribed the medication.  Stopping without other medication to take the place of Eliquis may increase your risk of developing a clot.  After discharge, you should have regular check-up appointments with your healthcare provider that is prescribing your Eliquis.  What do you do if you miss a dose? If a dose of ELIQUIS is not taken at the scheduled time, take it as soon as possible on the same day and twice-daily administration should be resumed.  The dose should not be doubled to make up for a missed dose.  Do not take more than one tablet of ELIQUIS at the same  time.  Important Safety Information A possible side effect of Eliquis is bleeding. You should call your healthcare provider right away if you experience any of the following: Bleeding from an injury or your nose that does not stop. Unusual colored urine (red or dark brown) or unusual colored stools (red or black). Unusual bruising for unknown reasons. A serious fall or if you hit your head (even if there is no bleeding).  Some medicines may interact with Eliquis and might increase your risk of bleeding or clotting while on Eliquis. To help avoid this, consult your healthcare provider or pharmacist prior to using any new prescription or non-prescription medications, including herbals, vitamins, non-steroidal anti-inflammatory drugs (NSAIDs) and supplements.  This website has more information on Eliquis (apixaban): [email or web address]

## 2024-05-26 NOTE — Evaluation (Signed)
 Physical Therapy Evaluation Patient Details Name: Alison Ward MRN: [id number] DOB: 07-20-1961 Today's Date: 05/26/2024  History of Present Illness  Pt is 63 year old presented to [facility] on  05/26/24 for Lt TKR. PMH - HTN, ICH, CVA, seizure, arthritis.  Clinical Impression  Pt doing well on POD #0 with lt TKR. Expect she will make excellent progress. Pt lives in 2nd story apartment so she will need to be able to negotiate stairs.         If plan is discharge home, recommend the following: Assist for transportation;Help with stairs or ramp for entrance;Assistance with cooking/housework   Can travel by private vehicle        Equipment Recommendations None recommended by PT  Recommendations for Other Services       Functional Status Assessment Patient has had a recent decline in their functional status and demonstrates the ability to make significant improvements in function in a reasonable and predictable amount of time.     Precautions / Restrictions Precautions Precautions: Knee Restrictions Weight Bearing Restrictions Per Provider Order: Yes LLE Weight Bearing Per Provider Order: Weight bearing as tolerated      Mobility  Bed Mobility Overal bed mobility: Needs Assistance Bed Mobility: Supine to Sit     Supine to sit: Min assist     General bed mobility comments: Assist for LLE    Transfers Overall transfer level: Needs assistance Equipment used: Rolling walker (2 wheels) Transfers: Sit to/from Stand Sit to Stand: Min assist, Supervision           General transfer comment: Min assist on initial stand to power up. Onsubsequent stand was supervision.    Ambulation/Gait Ambulation/Gait assistance: Contact guard assist Gait Distance (Feet): 100 Feet Assistive device: Rolling walker (2 wheels) Gait Pattern/deviations: Step-through pattern, Step-to pattern, Decreased stride length Gait velocity: decr Gait velocity interpretation: <1.31 ft/sec,  indicative of household ambulator   General Gait Details: Assist for safety. Verbal cues not to step past the walker. Pt progressed from step to gait to step through gait.  Stairs            Wheelchair Mobility     Tilt Bed    Modified Rankin (Stroke Patients Only)       Balance Overall balance assessment: Mild deficits observed, not formally tested                                           Pertinent Vitals/Pain Pain Assessment Pain Assessment: 0-10 Pain Score: 4  Pain Location: lt knee Pain Descriptors / Indicators: Aching Pain Intervention(s): Limited activity within patient's tolerance, Monitored during session, Repositioned    Home Living Family/patient expects to be discharged to:: Private residence Living Arrangements: Children Available Help at Discharge: Family;Available 24 hours/day Type of Home: Apartment Home Access: Stairs to enter Entrance Stairs-Rails: Right;Left;Can reach both Entrance Stairs-Number of Steps: flight   Home Layout: One level Home Equipment: Agricultural consultant (2 wheels);Toilet riser      Prior Function Prior Level of Function : Independent/Modified Independent             Mobility Comments: No assistive device.       Extremity/Trunk Assessment   Upper Extremity Assessment Upper Extremity Assessment: Defer to OT evaluation    Lower Extremity Assessment Lower Extremity Assessment: LLE deficits/detail;RLE deficits/detail RLE Deficits / Details: Very mild chronic weakness from stroke LLE Deficits /  Details: Lt knee 5-90 degrees AROM       Communication   Communication Communication: No apparent difficulties    Cognition Arousal: Alert Behavior During Therapy: WFL for tasks assessed/performed   PT - Cognitive impairments: No apparent impairments                         Following commands: Intact       Cueing Cueing Techniques: Verbal cues, Tactile cues     General Comments       Exercises Total Joint Exercises Quad Sets: AROM, Left, 5 reps, Supine Long Arc Quad: AROM, Left, 10 reps, Seated Knee Flexion: AROM, Left, 10 reps, Seated Goniometric ROM: 5-50   Assessment/Plan    PT Assessment Patient needs continued PT services  PT Problem List Decreased strength;Decreased range of motion;Decreased mobility;Pain       PT Treatment Interventions DME instruction;Gait training;Stair training;Functional mobility training;Therapeutic activities;Therapeutic exercise;Patient/family education    PT Goals (Current goals can be found in the Care Plan section)  Acute Rehab PT Goals Patient Stated Goal: return home PT Goal Formulation: With patient Time For Goal Achievement: 05/30/24 Potential to Achieve Goals: Good    Frequency 7X/week     Co-evaluation               AM-PAC PT 6 Clicks Mobility  Outcome Measure Help needed turning from your back to your side while in a flat bed without using bedrails?: A Little Help needed moving from lying on your back to sitting on the side of a flat bed without using bedrails?: A Little Help needed moving to and from a bed to a chair (including a wheelchair)?: A Little Help needed standing up from a chair using your arms (e.g., wheelchair or bedside chair)?: A Little Help needed to walk in hospital room?: A Little Help needed climbing 3-5 steps with a railing? : A Little 6 Click Score: 18    End of Session Equipment Utilized During Treatment: Gait belt Activity Tolerance: Patient tolerated treatment well Patient left: in chair;with call bell/phone within reach;with family/visitor present Nurse Communication: Mobility status PT Visit Diagnosis: Other abnormalities of gait and mobility (R26.89);Difficulty in walking, not elsewhere classified (R26.2);Pain Pain - Right/Left: Left Pain - part of body: Knee    Time: 1510-1540 PT Time Calculation (min) (ACUTE ONLY): 30 min   Charges:   PT Evaluation $PT Eval Low  Complexity: 1 Low PT Treatments $Gait Training: 8-22 mins PT General Charges $$ ACUTE PT VISIT: 1 Visit         [facility] PT Acute Rehabilitation Services Office [phone]   [facility] ORN [facility] 05/26/2024, 4:03 PM

## 2024-05-26 NOTE — Transfer of Care (Signed)
 Immediate Anesthesia Transfer of Care Note  Patient: Lillias Difrancesco  Procedure(s) Performed: LEFT TOTAL KNEE ARTHROPLASTY (Left: Knee)  Patient Location: PACU  Anesthesia Type:MAC  Level of Consciousness: awake, alert   Airway & Oxygen Therapy: Patient Spontanous Breathing  Post-op Assessment: Report given to RN  Post vital signs: Reviewed and stable  Last Vitals:  Vitals Value Taken Time  BP 101/61 05/26/24 10:55  Temp    Pulse 60 05/26/24 10:58  Resp 19 05/26/24 10:58  SpO2 99 % 05/26/24 10:58  Vitals shown include unfiled device data.  Last Pain:  Vitals:   05/26/24 0642  TempSrc:   PainSc: 0-No pain         Complications: No notable events documented.

## 2024-05-26 NOTE — Anesthesia Postprocedure Evaluation (Signed)
 Anesthesia Post Note  Patient: Alison Ward  Procedure(s) Performed: LEFT TOTAL KNEE ARTHROPLASTY (Left: Knee)     Patient location during evaluation: Mother Baby Anesthesia Type: Regional, MAC and Spinal Level of consciousness: oriented and awake and alert Pain management: pain level controlled Vital Signs Assessment: post-procedure vital signs reviewed and stable Respiratory status: spontaneous breathing and respiratory function stable Cardiovascular status: blood pressure returned to baseline and stable Postop Assessment: no headache, no backache, no apparent nausea or vomiting and able to ambulate Anesthetic complications: no   There were no known notable events for this encounter.  Last Vitals:  Vitals:   05/26/24 1115 05/26/24 1130  BP: 105/75 106/66  Pulse: (!) 54 (!) 52  Resp: 15 16  Temp:    SpO2: 100% 99%    Last Pain:  Vitals:   05/26/24 1130  TempSrc:   PainSc: Asleep                 Hanaan Gancarz

## 2024-05-26 NOTE — H&P (Signed)
 PREOPERATIVE H&P  Chief Complaint: left knee osteoarthritis  HPI: Alison Ward is a 63 y.o. female who presents for surgical treatment of left knee osteoarthritis.  She denies any changes in medical history.  Past Surgical History:  Procedure Laterality Date  . ABDOMINAL HYSTERECTOMY N/A 12/15/2015   Procedure: HYSTERECTOMY ABDOMINAL;  Surgeon: Harland JAYSON Birkenhead, MD;  Location: [facility];  Service: Gynecology;  Laterality: N/A;  . CESAREAN SECTION    . COLONOSCOPY    . POLYPECTOMY    . SALPINGOOPHORECTOMY Bilateral 12/15/2015   Procedure: SALPINGO OOPHORECTOMY;  Surgeon: Harland JAYSON Birkenhead, MD;  Location: [facility];  Service: Gynecology;  Laterality: Bilateral;  . VENTRICULOSTOMY Left 07/07/2013   Procedure: VENTRICULOSTOMY;  Surgeon: Reyes JONETTA Budge, MD;  Location: [facility];  Service: Neurosurgery;  Laterality: Left;  Left Ventriculostomy and Removal of Right Vetriculostomy   Social History   Socioeconomic History  . Marital status: Divorced    Spouse name: Not on file  . Number of children: 3  . Years of education: college  . Highest education level: Not on file  Occupational History  . Occupation: [employer]  Tobacco Use  . Smoking status: Never  . Smokeless tobacco: Never  Vaping Use  . Vaping status: Never Used  Substance and Sexual Activity  . Alcohol use: No  . Drug use: No  . Sexual activity: Not Currently    Birth control/protection: Surgical  Other Topics Concern  . Not on file  Social History Narrative  . Not on file   Social Drivers of Health   Financial Resource Strain: Low Risk  (01/29/2024)   Overall Financial Resource Strain (CARDIA)   . Difficulty of Paying Living Expenses: Not very hard  Food Insecurity: No Food Insecurity (01/29/2024)   Hunger Vital Sign   . Worried About Programme researcher, broadcasting/film/video in the Last Year: Never true   . Ran Out of Food in the Last Year: Never true  Transportation Needs: No Transportation Needs (01/29/2024)   PRAPARE  - Transportation   . Lack of Transportation (Medical): No   . Lack of Transportation (Non-Medical): No  Physical Activity: Insufficiently Active (01/29/2024)   Exercise Vital Sign   . Days of Exercise per Week: 3 days   . Minutes of Exercise per Session: 20 min  Stress: No Stress Concern Present (01/29/2024)   [employer] of Occupational Health - Occupational Stress Questionnaire   . Feeling of Stress : Not at all  Social Connections: Moderately Integrated (01/29/2024)   Social Connection and Isolation Panel   . Frequency of Communication with Friends and Family: Twice a week   . Frequency of Social Gatherings with Friends and Family: Twice a week   . Attends Religious Services: More than 4 times per year   . Active Member of Clubs or Organizations: Yes   . Attends Banker Meetings: More than 4 times per year   . Marital Status: Never married   Family History  Problem Relation Age of Onset  . Hypertension Mother   . Diabetes Mother   . Stroke Mother   . Hypertension Father   . Colon cancer Neg Hx   . Breast cancer Neg Hx   . Colon polyps Neg Hx   . Esophageal cancer Neg Hx   . Rectal cancer Neg Hx   . Stomach cancer Neg Hx    Allergies  Allergen Reactions  . Elavil  [Amitriptyline ] Other (See Comments)    DIZZINESS  .  Topiramate  Er Other (See Comments)    Suicidal Thoughts- Trokendi    Prior to Admission medications   Medication Sig Start Date End Date Taking? Authorizing Provider  aspirin  EC 81 MG tablet Take 1 tablet (81 mg total) by mouth daily. 01/15/17  Yes Jegede, Olugbemiga E, MD  atorvastatin  (LIPITOR) 40 MG tablet Take 1 tablet (40 mg total) by mouth daily. 01/29/24  Yes Newlin, Enobong, MD  diclofenac  Sodium (VOLTAREN ) 1 % GEL Apply 4 g topically 4 (four) times daily. 03/06/24  Yes Newlin, Corrina, MD  gabapentin  (NEURONTIN ) 600 MG tablet TAKE 1 TABLET BY MOUTH THREE TIMES A DAY 05/06/24  Yes Newlin, Enobong, MD  lidocaine  (LIDODERM ) 5 % Place 1 patch onto  the skin daily. Remove & Discard patch within 12 hours or as directed by MD 03/06/24  Yes Delbert Corrina, MD  lisinopril -hydrochlorothiazide  (ZESTORETIC ) 20-25 MG tablet Take 1 tablet by mouth daily. 01/29/24  Yes Newlin, Enobong, MD  docusate sodium  (COLACE) 100 MG capsule Take 1 capsule (100 mg total) by mouth daily as needed. 05/15/24 05/15/25  Jule Ronal CROME, PA-C  levETIRAcetam  (KEPPRA  XR) 500 MG 24 hr tablet TAKE 1 TABLET BY MOUTH AT BEDTIME. 05/15/24   Whitfield Raisin, NP  methocarbamol  (ROBAXIN ) 750 MG tablet Take 1 tablet (750 mg total) by mouth 3 (three) times daily as needed. 05/15/24   Jule Ronal CROME, PA-C  Misc. Devices MISC Raised toilet seat. Diagnosis- Osteoarthritis of the knees 03/06/24   Delbert Corrina, MD  ondansetron  (ZOFRAN ) 4 MG tablet Take 1 tablet (4 mg total) by mouth every 8 (eight) hours as needed for nausea or vomiting. 05/15/24   Jule Ronal CROME, PA-C  oxyCODONE -acetaminophen  (PERCOCET) 5-325 MG tablet Take 1-2 tablets by mouth every 6 (six) hours as needed. To be taken after surgery 05/15/24   Jule Ronal CROME, PA-C     Positive ROS: All other systems have been reviewed and were otherwise negative with the exception of those mentioned in the HPI and as above.  Physical Exam: General: Alert, no acute distress Cardiovascular: No pedal edema Respiratory: No cyanosis, no use of accessory musculature GI: abdomen soft Skin: No lesions in the area of chief complaint Neurologic: Sensation intact distally Psychiatric: Patient is competent for consent with normal mood and affect Lymphatic: no lymphedema  MUSCULOSKELETAL: exam stable  Assessment: left knee osteoarthritis  Plan: Plan for Procedure(s): ARTHROPLASTY, KNEE, TOTAL  The risks benefits and alternatives were discussed with the patient including but not limited to the risks of nonoperative treatment, versus surgical intervention including infection, bleeding, nerve injury,  blood clots, cardiopulmonary  complications, morbidity, mortality, among others, and they were willing to proceed.   Ozell Cummins, MD 05/26/2024 6:11 AM

## 2024-05-26 NOTE — Progress Notes (Signed)
 Orthopedic Tech Progress Note Patient Details:  Alison Ward 06-13-1961 [id number]  Ortho Devices Type of Ortho Device: Bone foam zero knee Ortho Device/Splint Location: LLE Ortho Device/Splint Interventions: Ordered, Other (comment)   Post Interventions Patient Tolerated: Well Instructions Provided: Care of device  [name] Pac 05/26/2024, 11:47 AM

## 2024-05-27 ENCOUNTER — Other Ambulatory Visit (HOSPITAL_COMMUNITY): Payer: Self-pay

## 2024-05-27 ENCOUNTER — Encounter (HOSPITAL_COMMUNITY): Payer: Self-pay | Admitting: Orthopaedic Surgery

## 2024-05-27 ENCOUNTER — Other Ambulatory Visit: Payer: Self-pay | Admitting: Physician Assistant

## 2024-05-27 DIAGNOSIS — M1712 Unilateral primary osteoarthritis, left knee: Secondary | ICD-10-CM | POA: Diagnosis not present

## 2024-05-27 MED ORDER — APIXABAN 2.5 MG PO TABS
ORAL_TABLET | ORAL | 0 refills | Status: DC
  Filled 2024-05-27: qty 60, 30d supply, fill #0

## 2024-05-27 NOTE — Progress Notes (Signed)
 Patient alert and oriented, mae's well, voiding adequate amount of urine, swallowing without difficulty, no c/o pain at time of discharge. Patient discharged home with family. Script and discharged instructions given to patient. Patient and family stated understanding of instructions given. Room was checked and accounted for all patient's belongings; discharge instructions concerning her medications, incision care, follow up appointment and when to call the doctor as needed were all discussed with patient by RN and she expressed understanding on the instructions given.

## 2024-05-27 NOTE — Progress Notes (Signed)
 Physical Therapy Treatment Patient Details Name: Alison Ward MRN: [id number] DOB: [birth year] Today's Date: 05/27/2024   History of Present Illness Pt is 63 year old presented to [facility] on  05/26/24 for Lt TKR. PMH - HTN, ICH, CVA, seizure, arthritis.    PT Comments  Continuing work on functional mobility and activity tolerance;  Session focused on progressive amb and stair negotiation in prep for DC home today; Overall managing well, including going up and down a flight of steps with cues for sequence; a little nausea present, but pt was able to manage steps; Hypotensive earlier this am; took sitting and standing BPs which checked out ok (See vitals flow sheet.); nice, stable knee in stance; We discussed knee precautions and car transfers; OK for dc home from PT standpoint    If plan is discharge home, recommend the following: Assist for transportation;Help with stairs or ramp for entrance;Assistance with cooking/housework   Can travel by private vehicle        Equipment Recommendations  None recommended by PT    Recommendations for Other Services       Precautions / Restrictions Precautions Precautions: Knee Precaution Booklet Issued: Yes (comment) Restrictions LLE Weight Bearing Per Provider Order: Weight bearing as tolerated     Mobility  Bed Mobility Overal bed mobility: Needs Assistance Bed Mobility: Supine to Sit     Supine to sit: Supervision     General bed mobility comments: Managing well    Transfers Overall transfer level: Needs assistance Equipment used: Rolling walker (2 wheels) Transfers: Sit to/from Stand Sit to Stand: Contact guard assist           General transfer comment: Cues for hand plecement and safety    Ambulation/Gait Ambulation/Gait assistance: Contact guard assist Gait Distance (Feet): 150 Feet Assistive device: Rolling walker (2 wheels) Gait Pattern/deviations: Step-through pattern, Step-to pattern, Decreased stride  length       General Gait Details: Cues for L knee activation in stance; no buckling noted   Stairs Stairs: Yes Stairs assistance: Contact guard assist Stair Management: Two rails, Step to pattern, Forwards Number of Stairs: 12 General stair comments: Cues for sequence and to self-monitor for activity tolerance; Nauseated, but able to come back down safely   Wheelchair Mobility     Tilt Bed    Modified Rankin (Stroke Patients Only)       Balance Overall balance assessment: Mild deficits observed, not formally tested                                          Communication Communication Communication: No apparent difficulties  Cognition Arousal: Alert Behavior During Therapy: WFL for tasks assessed/performed   PT - Cognitive impairments: No apparent impairments                         Following commands: Intact      Cueing Cueing Techniques: Verbal cues, Tactile cues  Exercises      General Comments General comments (skin integrity, edema, etc.): Nauseated while working in stairwell      Pertinent Vitals/Pain Pain Assessment Pain Assessment: 0-10 Pain Score: 5  Pain Location: lt knee Pain Descriptors / Indicators: Aching Pain Intervention(s): Monitored during session    Home Living  Prior Function            PT Goals (current goals can now be found in the care plan section) Acute Rehab PT Goals Patient Stated Goal: return home PT Goal Formulation: With patient Time For Goal Achievement: 05/30/24 Potential to Achieve Goals: Good Progress towards PT goals: Progressing toward goals    Frequency    7X/week      PT Plan      Co-evaluation              AM-PAC PT 6 Clicks Mobility   Outcome Measure  Help needed turning from your back to your side while in a flat bed without using bedrails?: None Help needed moving from lying on your back to sitting on the side of a flat  bed without using bedrails?: None Help needed moving to and from a bed to a chair (including a wheelchair)?: A Little Help needed standing up from a chair using your arms (e.g., wheelchair or bedside chair)?: A Little Help needed to walk in hospital room?: A Little Help needed climbing 3-5 steps with a railing? : A Little 6 Click Score: 20    End of Session Equipment Utilized During Treatment: Gait belt Activity Tolerance: Patient tolerated treatment well Patient left: in chair;with call bell/phone within reach Nurse Communication: Mobility status PT Visit Diagnosis: Other abnormalities of gait and mobility (R26.89);Difficulty in walking, not elsewhere classified (R26.2);Pain Pain - Right/Left: Left Pain - part of body: Knee     Time: 1049-1130 PT Time Calculation (min) (ACUTE ONLY): 41 min  Charges:    $Gait Training: 23-37 mins $Therapeutic Activity: 8-22 mins PT General Charges $$ ACUTE PT VISIT: 1 Visit                     Silvano Currier, PT  Acute Rehabilitation Services Office [phone] Secure Chat welcomed    Silvano VEAR Currier 05/27/2024, 12:14 PM

## 2024-05-27 NOTE — Discharge Summary (Signed)
 Patient ID: Alison Ward MRN: [id number] DOB/AGE: April 06, 1961 63 y.o.  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 female who was admitted 05/26/2024 for operative treatment ofPrimary osteoarthritis of left knee. Patient has severe unremitting pain that affects sleep, daily activities, and work/hobbies. After pre-op clearance the patient was taken to the operating room on 05/26/2024 and underwent  Procedure(s): LEFT TOTAL KNEE ARTHROPLASTY.    Patient was given perioperative antibiotics:  Anti-infectives (From admission, onward)    Start     Dose/Rate Route Frequency Ordered Stop   05/26/24 1500  ceFAZolin  (ANCEF ) IVPB 2g/100 mL premix        2 g 200 mL/hr over 30 Minutes Intravenous Every 6 hours 05/26/24 1229 05/26/24 2145   05/26/24 0813  vancomycin  (VANCOCIN ) powder  Status:  Discontinued          As needed 05/26/24 0813 05/26/24 1051   05/26/24 0630  ceFAZolin  (ANCEF ) IVPB 2g/100 mL premix  Status:  Discontinued        2 g 200 mL/hr over 30 Minutes Intravenous On call to O.R. 05/26/24 [id number] 05/26/24 1230        Patient was given sequential compression devices, early ambulation, and chemoprophylaxis to prevent DVT.  Inpatient Morphine  Milligram  Equivalents Per Day 7/28 - 7/29   Values displayed are in units of MME/Day    Order Start / End Date Yesterday Today    oxyCODONE  (Oxy IR/ROXICODONE ) immediate release tablet 5 mg 7/28 - 7/28 0 of Unknown --    oxyCODONE  (ROXICODONE ) 5 MG/5ML solution 5 mg 7/28 - 7/28 0 of Unknown --      Group total: 0 of Unknown     fentaNYL  (SUBLIMAZE ) injection 25-50 mcg 7/28 - 7/28 15 of 45-90 --    meperidine  (DEMEROL ) injection 6.25-12.5 mg 7/28 - 7/28 0 of 7.5-15 --    oxyCODONE  (Oxy IR/ROXICODONE ) immediate release tablet 5 mg 7/28 - No end date 0 of 15 0 of 30    oxyCODONE  (Oxy IR/ROXICODONE ) immediate release tablet 10 mg 7/28 - No end date 30 of 30 15 of 60    HYDROmorphone  (DILAUDID ) injection 1 mg 7/28 - No end date 0 of 40 0 of 60    fentaNYL  (SUBLIMAZE ) 100 MCG/2ML injection 7/28 - 7/28 0 of Unknown --    fentaNYL  citrate (PF) (SUBLIMAZE ) injection 7/28 - 7/28 *30 of 30 --    Daily Totals  * 75 of Unknown (at least 167.5-220) 15 of 150  *One-Step medication  Calculation Errors     Order Type Date Details   oxyCODONE  (Oxy IR/ROXICODONE ) immediate release tablet 5 mg Ordered Dose -- Insufficient frequency information   oxyCODONE  (ROXICODONE ) 5 MG/5ML solution 5 mg  Ordered Dose -- Insufficient frequency information   fentaNYL  (SUBLIMAZE ) 100 MCG/2ML injection Ordered Dose -- Frequency type could not be determined            Patient benefited maximally from hospital stay and there were no complications.    Recent vital signs: Patient Vitals for the past 24 hrs:  BP Temp Temp src Pulse Resp SpO2  05/27/24 0413 113/79 98.1 F (36.7 C) Oral (!) 51 18 100 %  05/26/24 2257 104/64 98.7 F (37.1 C) Oral (!) 49 18 99 %  05/26/24 2001 109/60 98.5 F (36.9 C) Oral 78 18 97 %  05/26/24 1600 (!) 132/101 97.8 F (36.6 C) Oral 60 20 100 %  05/26/24 1235 137/84 98 F (36.7 C) -- (!) 58 20 97 %  05/26/24 1215 114/79 (!) 97.5 F (36.4 C) -- (!) 52 19 100 %  05/26/24 1200 105/74 -- -- (!) 50 (!)  8 100 %  05/26/24 1145 103/67 -- -- (!) 52 13 98 %  05/26/24 1130 106/66 -- -- (!) 52 16 99 %  05/26/24 1115 105/75 -- -- (!) 54 15 100 %  05/26/24 1100 105/67 -- -- 61 17 95 %  05/26/24 1055 101/61 (!) 97.5 F (36.4 C) -- 61 19 99 %     Recent laboratory studies: No results for input(s): WBC, HGB, HCT, PLT, NA, K, CL, CO2, BUN, CREATININE, GLUCOSE, INR, CALCIUM  in the last 72 hours.  Invalid input(s): PT, 2   Discharge Medications:   Allergies as of 05/27/2024       Reactions   Elavil  [amitriptyline ] Other (See Comments)   DIZZINESS   Topiramate  Er Other (See Comments)   Suicidal Thoughts- Trokendi         Medication List     STOP taking these medications    aspirin  EC 81 MG tablet       TAKE these medications    apixaban  2.5 MG Tabs tablet Commonly known as: Eliquis  Take one tab po bid x 30 days after surgery to prevent blood clots   atorvastatin  40 MG tablet Commonly known as: LIPITOR Take 1 tablet (40 mg total) by mouth daily.   diclofenac  Sodium 1 % Gel Commonly known as: Voltaren  Apply 4 g topically 4 (four) times daily.   docusate sodium  100 MG capsule Commonly known as: Colace Take 1 capsule (100 mg total) by mouth daily as needed.   gabapentin  600 MG tablet Commonly known as: NEURONTIN  TAKE 1 TABLET BY MOUTH THREE TIMES A DAY   levETIRAcetam  500 MG 24 hr tablet Commonly known as: KEPPRA  XR TAKE 1 TABLET BY MOUTH AT BEDTIME.   lidocaine  5 % Commonly known as: Lidoderm  Place 1 patch onto the skin daily. Remove & Discard patch within 12 hours or as directed by MD   lisinopril -hydrochlorothiazide  20-25 MG tablet Commonly known as: ZESTORETIC  Take 1 tablet by mouth daily.   methocarbamol  750 MG tablet Commonly known as: ROBAXIN  Take 1 tablet (750 mg total) by mouth 3 (three) times daily as needed.   Misc. Devices Misc Raised toilet seat. Diagnosis- Osteoarthritis of the knees   ondansetron  4 MG  tablet Commonly known as: Zofran  Take 1 tablet (4 mg total) by mouth every 8 (eight) hours as needed for nausea or vomiting.   oxyCODONE -acetaminophen  5-325 MG tablet Commonly known as: Percocet Take 1-2 tablets by mouth every 6 (six) hours as needed. To be taken after surgery  Durable Medical Equipment  (From admission, onward)           Start     Ordered   05/26/24 1230  DME Walker rolling  Once       Question Answer Comment  Walker: With 5 Inch Wheels   Patient needs a walker to treat with the following condition Status post left partial knee replacement      05/26/24 1229   05/26/24 1230  DME 3 n 1  Once        05/26/24 1229   05/26/24 1230  DME Bedside commode  Once       Question:  Patient needs a bedside commode to treat with the following condition  Answer:  Status post left partial knee replacement   05/26/24 1229            Diagnostic Studies: DG Knee Left Port Result Date: 05/26/2024 CLINICAL DATA:  Knee pain EXAM: PORTABLE LEFT KNEE - 2 VIEW COMPARISON:  Left knee radiographs 03/14/2024 FINDINGS: Postsurgical changes of knee arthroplasty. Hardware appears intact and well seated. No acute fracture or dislocation. Overlying soft tissue edema and subcutaneous emphysema. Gas-containing effusion. IMPRESSION: Postsurgical changes of knee arthroplasty. No acute fracture or dislocation. Electronically Signed   By: Limin  Xu M.D.   On: 05/26/2024 15:59   ECHOCARDIOGRAM COMPLETE Result Date: 05/14/2024    ECHOCARDIOGRAM REPORT   Patient Name:   Alison Ward Date of Exam: 05/14/2024 Medical Rec #:  [id number]                Height:       66.7 in Accession #:    [id number]               Weight:       237.6 lb Date of Birth:  1960-12-03                BSA:          2.169 m Patient Age:    63 years                 BP:           119/79 mmHg Patient Gender: F                        HR:           61 bpm. Exam Location:  [address] Procedure: 2D Echo,  3D Echo, Cardiac Doppler and Color Doppler (Both Spectral            and Color Flow Doppler were utilized during procedure). Indications:     R01.1 Murmur  History:         Patient has prior history of Echocardiogram examinations, most                  recent 03/19/2023. Stroke, Mitral Valve Disease,                  Signs/Symptoms:Murmur; Risk Factors:Family History of Coronary                  Artery Disease and Hypertension. Headaches.  Sonographer:     Heather Hawks RDCS Referring Phys:  ALEENE JINNY PASSE Diagnosing Phys: Morene Brownie IMPRESSIONS  1. Left ventricular ejection fraction, by estimation, is 60 to 65%. Left ventricular ejection fraction by 3D volume is 67 %. The left ventricle has normal function. The left ventricle has no regional wall motion  abnormalities. Left ventricular diastolic  parameters were normal.  2. Right ventricular systolic function is normal. The right ventricular size is normal. There is mildly elevated pulmonary artery systolic pressure. The estimated right ventricular systolic pressure is 43.7 mmHg.  3. Left atrial size was severely dilated.  4. Large, eccentric, anteriorally directed jet that wraps the left atrium with significant splay. Suspect likely severe, TEE may be helpful for further characterization. The mitral valve is normal in structure. Severe mitral valve regurgitation. No evidence of mitral stenosis.  5. Increased aortic valve gradients measured, but the valve appears thin, no calcifications, and opens well. Suspect measured gradient from eccentric MR along CW interrogation line. The aortic valve is tricuspid. Aortic valve regurgitation is mild to moderate. No aortic stenosis is present.  6. 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  7. The inferior vena cava is normal in size with greater than 50% respiratory variability, suggesting right atrial pressure of 3 mmHg. FINDINGS  Left Ventricle: Left ventricular ejection fraction, by estimation, is 60 to 65%. Left  ventricular ejection fraction by 3D volume is 67 %. The left ventricle has normal function. The left ventricle has no regional wall motion abnormalities. The left ventricular internal cavity size was normal in size. There is no left ventricular hypertrophy. Left ventricular diastolic parameters were normal. Right Ventricle: The right ventricular size is normal. No increase in right ventricular wall thickness. Right ventricular systolic function is normal. There is mildly elevated pulmonary artery systolic pressure. The tricuspid regurgitant velocity is 3.19  m/s, and with an assumed right atrial pressure of 3 mmHg, the estimated right ventricular systolic pressure is 43.7 mmHg. Left Atrium: Left atrial size was severely dilated. Right Atrium: Right atrial size was normal in size. Pericardium: There is no evidence of pericardial effusion. Mitral Valve: Large, eccentric, anteriorally directed jet that wraps the left atrium with significant splay. Suspect likely severe, TEE may be helpful for further characterization. The mitral valve is normal in structure. Severe mitral valve regurgitation. No evidence of mitral valve stenosis. Tricuspid Valve: The tricuspid valve is normal in structure. Tricuspid valve regurgitation is mild . No evidence of tricuspid stenosis. Aortic Valve: Increased aortic valve gradients measured, but the valve appears thin, no calcifications, and opens well. Suspect measured gradient from eccentric MR along CW interrogation line. The aortic valve is tricuspid. Aortic valve regurgitation is mild to moderate. Aortic regurgitation PHT measures 613 msec. No aortic stenosis is present. Aortic valve mean gradient measures 34.0 mmHg. Aortic valve peak gradient measures 58.8 mmHg. Aortic valve area, by VTI measures 1.06 cm. Pulmonic Valve: The pulmonic valve was normal in structure. Pulmonic valve regurgitation is mild. 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 Aorta: The aortic root is normal in size and  structure. Venous: The inferior vena cava is normal in size with greater than 50% respiratory variability, suggesting right atrial pressure of 3 mmHg. IAS/Shunts: No atrial level shunt detected by color flow Doppler. Additional Comments: 3D was performed not requiring image post processing on an independent workstation and was normal.  LEFT VENTRICLE PLAX 2D LVIDd:         4.40 cm         Diastology LVIDs:         3.10 cm         LV e' medial:    11.70 cm/s LV PW:         1.40 cm         LV E/e' medial:  11.5 LV IVS:  1.10 cm         LV e' lateral:   13.20 cm/s LVOT diam:     2.60 cm         LV E/e' lateral: 10.2 LV SV:         111 LV SV Index:   51 LVOT Area:     5.31 cm        3D Volume EF                                LV 3D EF:    Left                                             ventricul                                             ar                                             ejection                                             fraction                                             by 3D                                             volume is                                             67 %.                                 3D Volume EF:                                3D EF:        67 %                                LV EDV:       150 ml                                LV ESV:       49 ml  LV SV:        101 ml RIGHT VENTRICLE RV Basal diam:  3.50 cm RV S prime:     14.80 cm/s TAPSE (M-mode): 2.5 cm RVSP:           43.7 mmHg LEFT ATRIUM              Index        RIGHT ATRIUM           Index LA diam:        3.60 cm  1.66 cm/m   RA Pressure: 3.00 mmHg LA Vol (A2C):   109.0 ml 50.24 ml/m  RA Area:     21.00 cm LA Vol (A4C):   133.0 ml 61.31 ml/m  RA Volume:   68.90 ml  31.76 ml/m LA Biplane Vol: 129.0 ml 59.46 ml/m  AORTIC VALVE                     PULMONIC VALVE AV Area (Vmax):    1.48 cm      PV Vmax:       2.34 m/s AV Area (Vmean):   1.33 cm      PV Vmean:      157.600  cm/s AV Area (VTI):     1.06 cm      PV VTI:        0.580 m AV Vmax:           383.25 cm/s   PV Peak grad:  22.0 mmHg AV Vmean:          269.250 cm/s  PV Mean grad:  11.8 mmHg AV VTI:            1.045 m AV Peak Grad:      58.8 mmHg AV Mean Grad:      34.0 mmHg LVOT Vmax:         107.00 cm/s LVOT Vmean:        67.400 cm/s LVOT VTI:          0.209 m LVOT/AV VTI ratio: 0.20 AI PHT:            613 msec  AORTA Ao Root diam: 3.20 cm Ao Asc diam:  3.80 cm MITRAL VALVE                TRICUSPID VALVE MV Area (PHT)  cm          TR Peak grad:   40.7 mmHg MV Decel Time: 224 msec     TR Vmax:        319.00 cm/s MV E velocity: 134.50 cm/s  Estimated RAP:  3.00 mmHg MV A velocity: 91.80 cm/s   RVSP:           43.7 mmHg MV E/A ratio:  1.47                             SHUNTS                             Systemic VTI:  0.21 m                             Systemic Diam: 2.60 cm Morene Brownie Electronically signed by Morene Brownie Signature Date/Time: 05/14/2024/5:58:12 PM    Final (Updated)     Disposition: Discharge disposition: 01-Home or  Self Care          Follow-up Information     Jule Ronal CROME, PA-C. Schedule an appointment as soon as possible for a visit in 2 week(s).   Specialty: Orthopedic Surgery Contact information: [address] [place]  [place] [address] [phone]         [facility] Follow up.   Why: [facility] will contact you for the first home visit Contact information: [phone]                 Signed: Ronal CROME Jule 05/27/2024, 7:59 AM

## 2024-05-27 NOTE — Progress Notes (Signed)
 Subjective: 1 Day Post-Op Procedure(s) (LRB): LEFT TOTAL KNEE ARTHROPLASTY (Left) Patient reports pain as mild.    Objective: Vital signs in last 24 hours: Temp:  [97.5 F (36.4 C)-98.7 F (37.1 C)] 98.1 F (36.7 C) (07/29 0413) Pulse Rate:  [49-78] 51 (07/29 0413) Resp:  [8-20] 18 (07/29 0413) BP: (101-137)/(60-101) 113/79 (07/29 0413) SpO2:  [95 %-100 %] 100 % (07/29 0413)  Intake/Output from previous day: 07/28 0701 - 07/29 0700 In: 1820 [P.O.:720; I.V.:850; IV Piggyback:250] Out: 300 [Urine:200; Blood:100] Intake/Output this shift: No intake/output data recorded.  No results for input(s): HGB in the last 72 hours. No results for input(s): WBC, RBC, HCT, PLT in the last 72 hours. No results for input(s): NA, K, CL, CO2, BUN, CREATININE, GLUCOSE, CALCIUM  in the last 72 hours. No results for input(s): LABPT, INR in the last 72 hours.  Neurologically intact Neurovascular intact Sensation intact distally Intact pulses distally Dorsiflexion/Plantar flexion intact Incision: dressing C/D/I No cellulitis present Compartment soft   Assessment/Plan: 1 Day Post-Op Procedure(s) (LRB): LEFT TOTAL KNEE ARTHROPLASTY (Left) Advance diet Up with therapy D/C IV fluids Discharge home with home health once cleared by PT WBAT LLE Eliquis  sent to [facility] 05/27/2024, 7:58 AM

## 2024-05-27 NOTE — Evaluation (Signed)
 Occupational Therapy Evaluation/Discharge Patient Details Name: Alison Ward MRN: [id number] DOB: [birth year] Today's Date: 05/27/2024   History of Present Illness   Pt is 63 year old presented to [facility] on  05/26/24 for Lt TKR. PMH - HTN, ICH, CVA, seizure, arthritis.     Clinical Impressions Patient is s/p L partial knee replacement surgery resulting in functional limitations due to the deficits listed below (see OT Problem List). All education has been completed during evaluation. Education also provided for compensatory strategies during ADLs/mobility. Pt verbalized and/or demonstrated understanding. Recommended use of AE such as reacher, sock aid, long handled shoe horn and long handled sponge at home initially to increase independence with ADL tasks. PT reports that she has a toilet riser and tub bench available for her to use at home. No follow up OT services recommended. All education has been completed. Thank you for the referral. OT will sign off and follow up with surgeon when recommended.       If plan is discharge home, recommend the following:   Assist for transportation;Help with stairs or ramp for entrance;A little help with bathing/dressing/bathroom;Assistance with cooking/housework     Functional Status Assessment   Patient has had a recent decline in their functional status and demonstrates the ability to make significant improvements in function in a reasonable and predictable amount of time.     Equipment Recommendations   None recommended by OT      Precautions/Restrictions   Precautions Precautions: Knee Precaution Booklet Issued: Yes (comment) (By PT at previous session) Recall of Precautions/Restrictions: Intact Restrictions Weight Bearing Restrictions Per Provider Order: Yes LLE Weight Bearing Per Provider Order: Weight bearing as tolerated     Mobility Bed Mobility Overal bed mobility: Modified Independent   Transfers Overall  transfer level:  (See PT note for transfer level)           Balance Overall balance assessment:  (See PT note for balance assessment)       ADL either performed or assessed with clinical judgement   ADL        General ADL Comments: Due to recent L TKA, pt will require Min A to complete LB ADL tasks. Pt has hx of CVA with some R side residual weakness. Daughter is able to assist. Pt education provided on compensatory techniques and use of AE such as reacher, sock aid, and long handled shoe horn/sponge to assist with functional performance.     Vision Baseline Vision/History: 0 No visual deficits Ability to See in Adequate Light: 0 Adequate Patient Visual Report: No change from baseline Vision Assessment?: No apparent visual deficits     Perception Perception: Not tested       Praxis Praxis: Not tested       Pertinent Vitals/Pain Pain Assessment Pain Assessment: Faces Faces Pain Scale: No hurt     Extremity/Trunk Assessment Upper Extremity Assessment Upper Extremity Assessment: Overall WFL for tasks assessed (History of CVA with R side residual weakness and decreased coordination although functional.)   Lower Extremity Assessment Lower Extremity Assessment: Defer to PT evaluation   Cervical / Trunk Assessment Cervical / Trunk Assessment: Normal   Communication Communication Communication: No apparent difficulties   Cognition Arousal: Alert Behavior During Therapy: WFL for tasks assessed/performed Cognition: No apparent impairments     Following commands: Intact       Cueing  General Comments   Cueing Techniques: Verbal cues  Pt education provided on use of RW over toilet to assist with sit<>stand transition  when toilet is low and/or no grab bar/arm rests are available. Eduction provided on use of chair cushion on low surface seats to elevate sitting height. Pt education provided on use of AE when completing meal prep/kitchen tasks.           Home  Living Family/patient expects to be discharged to:: Private residence Living Arrangements: Children Available Help at Discharge: Family;Available 24 hours/day Type of Home: Apartment Home Access: Stairs to enter [employer] of Steps: flight Entrance Stairs-Rails: Right;Left;Can reach both Home Layout: One level     Bathroom Shower/Tub: Chief Strategy Officer: Standard     Home Equipment: Agricultural consultant (2 wheels);Toilet riser;Tub bench   Additional Comments: Pt lives with daughter and young granddaughter. Daughter works from home.      Prior Functioning/Environment Prior Level of Function : Independent/Modified Independent       OT Problem List: Impaired balance (sitting and/or standing);Decreased knowledge of use of DME or AE        OT Goals(Current goals can be found in the care plan section)   Acute Rehab OT Goals OT Goal Formulation: All assessment and education complete, DC therapy   OT Frequency:   1X visit       AM-PAC OT 6 Clicks Daily Activity     Outcome Measure Help from another person eating meals?: None Help from another person taking care of personal grooming?: None Help from another person toileting, which includes using toliet, bedpan, or urinal?: None Help from another person bathing (including washing, rinsing, drying)?: A Little Help from another person to put on and taking off regular upper body clothing?: None Help from another person to put on and taking off regular lower body clothing?: A Little 6 Click Score: 22   End of Session Nurse Communication: Mobility status  Activity Tolerance: Patient tolerated treatment well Patient left: in bed;with call bell/phone within reach  OT Visit Diagnosis: Muscle weakness (generalized) (M62.81)                Time: 1027-1050 OT Time Calculation (min): 23 min Charges:  OT General Charges $OT Visit: 1 Visit OT Evaluation $OT Eval Moderate Complexity: 1 Mod OT  Treatments $Self Care/Home Management : 8-22 mins  Leita Howell, OTR/L,CBIS  Supplemental OT - [facility] and [facility] Secure Chat Preferred    Giannis Corpuz, Leita BIRCH 05/27/2024, 12:37 PM

## 2024-05-28 ENCOUNTER — Telehealth: Payer: Self-pay

## 2024-05-28 NOTE — Transitions of Care (Post Inpatient/ED Visit) (Signed)
 05/28/2024  Name: Alison Ward MRN: [id number] DOB: [birth year]  Today's TOC FU Call Status: Today's TOC FU Call Status:: Successful TOC FU Call Completed TOC FU Call Complete Date: 05/28/24 Patient's Name and Date of Birth confirmed.  Transition Care Management Follow-up Telephone Call Date of Discharge: 05/27/24 Discharge Facility: [facility]) Type of Discharge: Inpatient Admission Primary Inpatient Discharge Diagnosis:: s/p left TKR How have you been since you were released from the hospital?: Better (She said today she has been moving around and is doing better) Any questions or concerns?: No  Items Reviewed: Did you receive and understand the discharge instructions provided?: Yes Medications obtained,verified, and reconciled?: Yes (Medications Reviewed) (She did not have any questions about the med regime.  She said her daughter sets them up in a pill box for her and sets an alarm to remind her to take her pain medication) Any new allergies since your discharge?: No Dietary orders reviewed?: Yes Type of Diet Ordered:: heart healthy, low sodium Do you have support at home?: Yes People in Home [RPT]: child(ren), adult Name of Support/Comfort Primary Source: her 2 daughters  Medications Reviewed Today: Medications Reviewed Today     Reviewed by Marvis Bradley, RN (Case Manager) on 05/28/24 at 1411  Med List Status: <None>   Medication Order Taking? Sig Documenting Provider Last Dose Status Informant  atorvastatin  (LIPITOR) 40 MG tablet [id number]  Take 1 tablet (40 mg total) by mouth daily. Newlin, Enobong, MD  Active Self  diclofenac  Sodium (VOLTAREN ) 1 % GEL [id number]  Apply 4 g topically 4 (four) times daily. Newlin, Enobong, MD  Active Self  docusate sodium  (COLACE) 100 MG capsule [id number]  Take 1 capsule (100 mg total) by mouth daily as needed. Jule Ronal CROME, PA-C  Active   gabapentin  (NEURONTIN ) 600 MG tablet [id number]  TAKE 1 TABLET BY MOUTH THREE TIMES  A DAY Newlin, Enobong, MD  Active Self  levETIRAcetam  (KEPPRA  XR) 500 MG 24 hr tablet [id number]  TAKE 1 TABLET BY MOUTH AT BEDTIME. Whitfield Raisin, NP  Active   lidocaine  (LIDODERM ) 5 % [id number]  Place 1 patch onto the skin daily. Remove & Discard patch within 12 hours or as directed by MD Delbert Clam, MD  Active Self  lisinopril -hydrochlorothiazide  (ZESTORETIC ) 20-25 MG tablet [id number]  Take 1 tablet by mouth daily. Newlin, Enobong, MD  Active Self  methocarbamol  (ROBAXIN ) 750 MG tablet [id number]  Take 1 tablet (750 mg total) by mouth 3 (three) times daily as needed. Jule Ronal CROME, PA-C  Active   Misc. Devices MISC [id number]  Raised toilet seat. Diagnosis- Osteoarthritis of the knees Delbert Clam, MD  Active Self  ondansetron  (ZOFRAN ) 4 MG tablet [id number]  Take 1 tablet (4 mg total) by mouth every 8 (eight) hours as needed for nausea or vomiting. Jule Ronal CROME, PA-C  Active   oxyCODONE -acetaminophen  (PERCOCET) 5-325 MG tablet [id number]  Take 1-2 tablets by mouth every 6 (six) hours as needed. To be taken after surgery Jule Ronal CROME, PA-C  Active             Home Care and Equipment/Supplies: Were Home Health Services Ordered?: Yes Name of Home Health Agency:: [facility] Has Agency set up a time to come to your home?: Yes First Home Health Visit Date: 05/28/24 Any new equipment or medical supplies ordered?: Yes Name of Medical supply agency?: She said a CPM and RW were delivered to her house prior to the surgery Were you able to get the equipment/medical supplies?: Yes  Do you have any questions related to the use of the equipment/supplies?: No  Functional Questionnaire: Do you need assistance with bathing/showering or dressing?: Yes (her daughters help as needed) Do you need assistance with meal preparation?: Yes (her daughters help as needed) Do you need assistance with eating?: No Do you have difficulty maintaining continence: No Do you need assistance with getting  out of bed/getting out of a chair/moving?: Yes (ambulates with RW, WBAT LLE) Do you have difficulty managing or taking your medications?: Yes (her oldest daughter manages her meds)  Follow up appointments reviewed: PCP Follow-up appointment confirmed?: Yes Date of PCP follow-up appointment?: 07/31/24 Follow-up Provider: [facility] Follow-up appointment confirmed?: Yes Date of Specialist follow-up appointment?: 06/10/24 Follow-Up Specialty Provider:: orthopedic surgeon. Do you need transportation to your follow-up appointment?: No (She said she uses Medicaid transportation or her grandson takes her to her appointments) Do you understand care options if your condition(s) worsen?: Yes-patient verbalized understanding  She said she is waiting for a call from cardiology to schedule an outpatient procedure.    SIGNATURE Slater Diesel, RN

## 2024-05-29 ENCOUNTER — Telehealth: Payer: Self-pay

## 2024-05-29 ENCOUNTER — Ambulatory Visit: Payer: Self-pay | Admitting: Cardiology

## 2024-05-29 NOTE — Telephone Encounter (Signed)
 Pt is 3 days s/p total knee. Daughter called and was concerned of bleeding under her bandage. Bandage was not saturated, no fever or chills. She also stated she was having swelling down in her ankle. No calf pain. I advised this was normal after a knee replacement. Advised to elevate and ice.Advised to call back if bandage becomes saturated or starts having calf pain and swelling.

## 2024-05-29 NOTE — Progress Notes (Signed)
 Left heart function is normal.  He has severe mitral valve regurgitation and mild to moderate aortic stenosis and pulmonary valve stenosis.  Moderate pulm hypertension.  He will need further cardiac workup and follow-up.  Further discussion on his office visit.  MR= Mitral regurgitation. (leakage on the left heart valve)  Similarly TR means tricuspid regurgitation (leakage on the right heart valve).

## 2024-06-10 ENCOUNTER — Encounter (HOSPITAL_BASED_OUTPATIENT_CLINIC_OR_DEPARTMENT_OTHER): Payer: Self-pay

## 2024-06-10 ENCOUNTER — Ambulatory Visit: Admitting: Physician Assistant

## 2024-06-10 ENCOUNTER — Encounter: Payer: Self-pay | Admitting: Physician Assistant

## 2024-06-10 DIAGNOSIS — Z96652 Presence of left artificial knee joint: Secondary | ICD-10-CM

## 2024-06-10 NOTE — Progress Notes (Signed)
 Post-Op Visit Note   Patient: Alison Ward           Date of Birth: 08/21/1961           MRN: [id number] Visit Date: 06/10/2024 PCP: Delbert Clam, MD   Assessment & Plan:  Chief Complaint:  Chief Complaint  Patient presents with   Left Knee - Follow-up    Left total knee arthroplasty 05/26/2024   Visit Diagnoses:  1. Status post total left knee replacement     Plan: Patient is a pleasant 63 year old female who comes in today 2 weeks status post left total knee replacement 05/26/2024.  She has been doing well.  She has been taking Tylenol  and gabapentin  with occasional oxycodone  for pain.  She has been getting home health physical therapy and is ambulating with a walker.  She has been compliant taking her Eliquis  twice daily for DVT prophylaxis.  Examination of the left knee reveals a well-healed surgical incision with nylon sutures in place.  No evidence of infection or cellulitis.  Calves are soft nontender.  She is neurovascularly intact distally.  Today, sutures were removed and Steri-Strips applied.  She will continue taking her Eliquis  twice a day for another 2 weeks and then transition to a baby aspirin  twice a day for the following 2 weeks.  She tells me she does not need a refill on oxycodone .  I sent in a referral for outpatient physical therapy.  She will follow-up with us  in 4 weeks for repeat evaluation and 2 view x-rays of the left knee.  Call with concerns or questions.  Follow-Up Instructions: Return in about 4 weeks (around 07/08/2024).   Orders:  No orders of the defined types were placed in this encounter.  No orders of the defined types were placed in this encounter.   Imaging: No new imaging  PMFS History: Patient Active Problem List   Diagnosis Date Noted   Status post total left knee replacement 05/26/2024   Acute pain of right knee 05/23/2023   Murmur, cardiac 01/24/2023   Primary osteoarthritis of left knee 05/19/2022   Lumbar  radiculopathy 03/11/2018   Ganglion cyst of right foot 07/04/2017   Chronic tension-type headache, not intractable 12/30/2015   Post-operative state 12/15/2015   Simple partial seizure disorder (HCC) 11/04/2015   Left sided numbness 09/25/2015   Leukopenia 09/25/2015   Tension vascular headache 09/07/2015   Meralgia paresthetica of left side 09/07/2015   Fainting spell 09/07/2015   Prediabetes 09/02/2015   Stye 08/06/2014   Essential hypertension, benign 04/16/2014   CVA (cerebral vascular accident) (HCC) 04/16/2014   Headache 04/16/2014   Tension headache 03/06/2014   Obesity, morbid (HCC) 08/13/2013   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   ICH (intracerebral hemorrhage) (HCC) 07/21/2013   Hypokalemia 07/21/2013   Intracerebral hemorrhage (HCC) 07/06/2013   Essential hypertension 02/24/2009   Past Medical History:  Diagnosis Date   Arthritis    Chronic headaches    daily, bitemporal, associated with tingling in face   Heart murmur    Hypertension    Mitral regurgitation    Pre-diabetes    Seizures (HCC)    partial seizures last one on 10/22/15  On Keppra  currently - last seizure 2021   Stroke (HCC) 07/05/2013   Tremor    in hands, more in right    Family History  Problem Relation Age of Onset   Hypertension Mother    Diabetes Mother    Stroke Mother  Hypertension Father    Colon cancer Neg Hx    Breast cancer Neg Hx    Colon polyps Neg Hx    Esophageal cancer Neg Hx    Rectal cancer Neg Hx    Stomach cancer Neg Hx     Past Surgical History:  Procedure Laterality Date   ABDOMINAL HYSTERECTOMY N/A 12/15/2015   Procedure: HYSTERECTOMY ABDOMINAL;  Surgeon: Harland JAYSON Birkenhead, MD;  Location: [facility];  Service: Gynecology;  Laterality: N/A;   CESAREAN SECTION     COLONOSCOPY     POLYPECTOMY     SALPINGOOPHORECTOMY Bilateral 12/15/2015   Procedure: SALPINGO OOPHORECTOMY;  Surgeon: Harland JAYSON Birkenhead, MD;  Location: [facility];  Service: Gynecology;  Laterality:  Bilateral;   TOTAL KNEE ARTHROPLASTY Left 05/26/2024   Procedure: LEFT TOTAL KNEE ARTHROPLASTY;  Surgeon: Jerri Kay HERO, MD;  Location: [facility];  Service: Orthopedics;  Laterality: Left;   VENTRICULOSTOMY Left 07/07/2013   Procedure: VENTRICULOSTOMY;  Surgeon: Reyes JONETTA Budge, MD;  Location: [facility];  Service: Neurosurgery;  Laterality: Left;  Left Ventriculostomy and Removal of Right Vetriculostomy   Social History   Occupational History   Occupation: [employer]  Tobacco Use   Smoking status: Never   Smokeless tobacco: Never  Vaping Use   Vaping status: Never Used  Substance and Sexual Activity   Alcohol use: No   Drug use: No   Sexual activity: Not Currently    Birth control/protection: Surgical

## 2024-06-25 ENCOUNTER — Ambulatory Visit: Payer: Self-pay | Admitting: *Deleted

## 2024-06-25 NOTE — Telephone Encounter (Signed)
 LVM for patient to return call. Offering patient an earlier appointment with PCP.

## 2024-06-25 NOTE — Telephone Encounter (Signed)
 FYI Only or Action Required?: FYI only for provider.  Patient was last seen in primary care on 03/06/2024 by Newlin, Enobong, MD.  Called Nurse Triage reporting Fatigue.  Symptoms began several months ago. Becoming very tired easily and hands are shaking badly.   Making it hard to feed herself.  Interventions attempted: Nothing.  Symptoms are: gradually worsening.  Triage Disposition: See PCP Within 2 Weeks  Patient/caregiver understands and will follow disposition?: Yes Made an appt for her next available was with Haze Servant, NP.      Reason for Disposition  Fatigue is a chronic symptom (recurrent or ongoing AND present > 4 weeks)  Answer Assessment - Initial Assessment Questions 1. DESCRIPTION: Describe how you are feeling.     I had knee surgery in July. I just don't have any energy.  Is there anything I can take to boost my energy?   If I do any walking I get exhausted easily.   My hand become shaky so it's hard to feed myself.   2. SEVERITY: How bad is it?  Can you stand and walk?     It's hard to feed myself due to my hands shaking. 3. ONSET: When did these symptoms begin? (e.g., hours, days, weeks, months)     Since my knee surgery in July.   4. CAUSE: What do you think is causing the weakness or fatigue? (e.g., not drinking enough fluids, medical problem, trouble sleeping)     I don't know The past 3 weeks I've been so tired.   5. NEW MEDICINES:  Have you started on any new medicines recently? (e.g., opioid pain medicines, benzodiazepines, muscle relaxants, antidepressants, antihistamines, neuroleptics, beta blockers)     They gave me a blood clot medication and a muscle relaxer and a stool softener.    I'm not on the blood clot medicine now.    I put a lot of weight on my right leg since I had the surgery on left leg. 6. OTHER SYMPTOMS: Do you have any other symptoms? (e.g., chest pain, fever, cough, SOB, vomiting, diarrhea, bleeding, other areas of  pain)     I'm just so tired easily.   7. PREGNANCY: Is there any chance you are pregnant? When was your last menstrual period?     N/A  Protocols used: Weakness (Generalized) and Fatigue-A-AH

## 2024-06-25 NOTE — Telephone Encounter (Signed)
 Called patient to review sx and patient reports she was not able to talk at this time. Recommended patient to call back when she can review sx.    Patient had knee surgery in July no pain or anything. Just has really low energy to do things and move around. Would like to know what she can drink or take to gain energy.

## 2024-06-26 ENCOUNTER — Encounter: Payer: Self-pay | Admitting: Family Medicine

## 2024-06-26 ENCOUNTER — Ambulatory Visit: Attending: Family Medicine | Admitting: Family Medicine

## 2024-06-26 VITALS — BP 109/74 | HR 71 | Ht 68.0 in | Wt 232.0 lb

## 2024-06-26 DIAGNOSIS — R7303 Prediabetes: Secondary | ICD-10-CM | POA: Insufficient documentation

## 2024-06-26 DIAGNOSIS — Z79899 Other long term (current) drug therapy: Secondary | ICD-10-CM | POA: Diagnosis not present

## 2024-06-26 DIAGNOSIS — R519 Headache, unspecified: Secondary | ICD-10-CM | POA: Insufficient documentation

## 2024-06-26 DIAGNOSIS — R251 Tremor, unspecified: Secondary | ICD-10-CM

## 2024-06-26 DIAGNOSIS — G5710 Meralgia paresthetica, unspecified lower limb: Secondary | ICD-10-CM | POA: Diagnosis not present

## 2024-06-26 DIAGNOSIS — Z7982 Long term (current) use of aspirin: Secondary | ICD-10-CM | POA: Diagnosis not present

## 2024-06-26 DIAGNOSIS — R5383 Other fatigue: Secondary | ICD-10-CM | POA: Diagnosis not present

## 2024-06-26 DIAGNOSIS — G252 Other specified forms of tremor: Secondary | ICD-10-CM | POA: Diagnosis not present

## 2024-06-26 DIAGNOSIS — E785 Hyperlipidemia, unspecified: Secondary | ICD-10-CM | POA: Insufficient documentation

## 2024-06-26 DIAGNOSIS — Z6835 Body mass index (BMI) 35.0-35.9, adult: Secondary | ICD-10-CM

## 2024-06-26 DIAGNOSIS — Z96652 Presence of left artificial knee joint: Secondary | ICD-10-CM

## 2024-06-26 DIAGNOSIS — M79651 Pain in right thigh: Secondary | ICD-10-CM

## 2024-06-26 DIAGNOSIS — I10 Essential (primary) hypertension: Secondary | ICD-10-CM | POA: Diagnosis not present

## 2024-06-26 DIAGNOSIS — Z23 Encounter for immunization: Secondary | ICD-10-CM

## 2024-06-26 DIAGNOSIS — Z8673 Personal history of transient ischemic attack (TIA), and cerebral infarction without residual deficits: Secondary | ICD-10-CM | POA: Diagnosis not present

## 2024-06-26 DIAGNOSIS — Z982 Presence of cerebrospinal fluid drainage device: Secondary | ICD-10-CM | POA: Insufficient documentation

## 2024-06-26 DIAGNOSIS — M1712 Unilateral primary osteoarthritis, left knee: Secondary | ICD-10-CM | POA: Diagnosis not present

## 2024-06-26 DIAGNOSIS — M1711 Unilateral primary osteoarthritis, right knee: Secondary | ICD-10-CM | POA: Insufficient documentation

## 2024-06-26 NOTE — Patient Instructions (Signed)
 VISIT SUMMARY:  Today, we discussed your post-surgical tremors and fatigue following your knee replacement surgery. We also reviewed your recovery progress, right leg pain, and hypertension management.  YOUR PLAN:  -INTENTION TREMOR OF HANDS: You have developed tremors in your hands since your knee surgery, which worsen with fatigue and fine motor tasks. This could be due to medication side effects or thyroid issues. We will check your thyroid function and blood count. If these tests are normal, we will refer you to occupational therapy to help with fine motor skills. Please contact your neurologist for an earlier appointment if the tests are normal.  -FATIGUE: You are experiencing fatigue, cold intolerance, and tremors after your surgery. This could be due to thyroid issues or anemia. We will check your thyroid function and blood count to investigate further.  -OSTEOARTHRITIS OF THE RIGHT KNEE, STATUS POST KNEE REPLACEMENT: Your knee is recovering well after the replacement surgery, though you have some difficulty with daily activities and stairs. We will refer you to physical therapy to help with your knee rehabilitation.  -RIGHT LEG PAIN: You have pain in your right leg due to compensating for your knee surgery. Continue using muscle relaxants as needed for this pain.  -HYPERTENSION: Your high blood pressure is being managed with your current medications. No changes are needed at this time.  INSTRUCTIONS:  Please complete the thyroid function tests and complete blood count as soon as possible. Follow up with physical therapy for your knee rehabilitation. If your lab results are normal, contact your neurologist for an earlier appointment. Continue taking your current medications as prescribed.

## 2024-06-26 NOTE — Progress Notes (Signed)
 Subjective:  Patient ID: Alison Ward, female    DOB: 04/20/1961  Age: 63 y.o. MRN: [id number]  CC: Fatigue (Hands shaking/Headaches in the afternoon)     Discussed the use of AI scribe software for clinical note transcription with the patient, who gave verbal consent to proceed.  History of Present Illness Alison Ward is a 63 year old female  with a history of hypertension, chronic headaches, previous history of right  Hemorrhagic stroke status post ventriculostomy in 06/2013, Prediabetes, meralgia paresthetica, hyperlipidemia who presents with post-surgical tremors and fatigue following knee replacement surgery.  She experiences significant tremors and fatigue since her knee replacement surgery. The tremors interfere with daily activities, including eating, drinking, and dressing, and require assistance for tasks like using the microwave. Tremors worsen with fatigue and improve with rest.  She feels cold in the evenings despite a warm room temperature, necessitating additional clothing. Slight headaches occur in the late afternoons or evenings. Her current medications include atorvastatin , diclofenac , gabapentin , lisinopril , and low-dose aspirin . She recently discontinued oxycodone  and Eliquis . She has not had a neurology appointment since before the surgery.  Her knee recovery is progressing, with some difficulty in daily activities and stair navigation. She uses a muscle relaxer for right leg pain due to compensating for her knee surgery. She reports no significant pain currently, and the knee pain has resolved.    Past Medical History:  Diagnosis Date   Arthritis    Chronic headaches    daily, bitemporal, associated with tingling in face   Heart murmur    Hypertension    Mitral regurgitation    Pre-diabetes    Seizures (HCC)    partial seizures last one on 10/22/15  On Keppra  currently - last seizure 2021   Stroke (HCC) 07/05/2013   Tremor    in hands,  more in right    Past Surgical History:  Procedure Laterality Date   ABDOMINAL HYSTERECTOMY N/A 12/15/2015   Procedure: HYSTERECTOMY ABDOMINAL;  Surgeon: Harland JAYSON Birkenhead, MD;  Location: [facility];  Service: Gynecology;  Laterality: N/A;   CESAREAN SECTION     COLONOSCOPY     POLYPECTOMY     SALPINGOOPHORECTOMY Bilateral 12/15/2015   Procedure: SALPINGO OOPHORECTOMY;  Surgeon: Harland JAYSON Birkenhead, MD;  Location: [facility];  Service: Gynecology;  Laterality: Bilateral;   TOTAL KNEE ARTHROPLASTY Left 05/26/2024   Procedure: LEFT TOTAL KNEE ARTHROPLASTY;  Surgeon: Jerri Kay HERO, MD;  Location: [facility];  Service: Orthopedics;  Laterality: Left;   VENTRICULOSTOMY Left 07/07/2013   Procedure: VENTRICULOSTOMY;  Surgeon: Reyes JONETTA Budge, MD;  Location: [facility];  Service: Neurosurgery;  Laterality: Left;  Left Ventriculostomy and Removal of Right Vetriculostomy    Family History  Problem Relation Age of Onset   Hypertension Mother    Diabetes Mother    Stroke Mother    Hypertension Father    Colon cancer Neg Hx    Breast cancer Neg Hx    Colon polyps Neg Hx    Esophageal cancer Neg Hx    Rectal cancer Neg Hx    Stomach cancer Neg Hx     Social History   Socioeconomic History   Marital status: Divorced    Spouse name: Not on file   Number of children: 3   Years of education: college   Highest education level: Not on file  Occupational History   Occupation: [employer]  Tobacco Use   Smoking status: Never   Smokeless tobacco: Never  Vaping Use   Vaping status: Never Used  Substance and Sexual Activity   Alcohol use: No   Drug use: No   Sexual activity: Not Currently    Birth control/protection: Surgical  Other Topics Concern   Not on file  Social History Narrative   Not on file   Social Drivers of Health   Financial Resource Strain: Low Risk  (01/29/2024)   Overall Financial Resource Strain (CARDIA)    Difficulty of Paying Living Expenses: Not very hard  Food Insecurity: No  Food Insecurity (01/29/2024)   Hunger Vital Sign    Worried About Running Out of Food in the Last Year: Never true    Ran Out of Food in the Last Year: Never true  Transportation Needs: No Transportation Needs (01/29/2024)   PRAPARE - Administrator, Civil Service (Medical): No    Lack of Transportation (Non-Medical): No  Physical Activity: Insufficiently Active (01/29/2024)   Exercise Vital Sign    Days of Exercise per Week: 3 days    Minutes of Exercise per Session: 20 min  Stress: No Stress Concern Present (01/29/2024)   [employer] of Occupational Health - Occupational Stress Questionnaire    Feeling of Stress : Not at all  Social Connections: Moderately Integrated (01/29/2024)   Social Connection and Isolation Panel    Frequency of Communication with Friends and Family: Twice a week    Frequency of Social Gatherings with Friends and Family: Twice a week    Attends Religious Services: More than 4 times per year    Active Member of [employer] or Organizations: Yes    Attends Engineer, structural: More than 4 times per year    Marital Status: Never married    Allergies  Allergen Reactions   Elavil  [Amitriptyline ] Other (See Comments)    DIZZINESS   Topiramate  Er Other (See Comments)    Suicidal Thoughts- Trokendi     Outpatient Medications Prior to Visit  Medication Sig Dispense Refill   atorvastatin  (LIPITOR) 40 MG tablet Take 1 tablet (40 mg total) by mouth daily. 90 tablet 1   gabapentin  (NEURONTIN ) 600 MG tablet TAKE 1 TABLET BY MOUTH THREE TIMES A DAY 270 tablet 0   levETIRAcetam  (KEPPRA  XR) 500 MG 24 hr tablet TAKE 1 TABLET BY MOUTH AT BEDTIME. 90 tablet 0   lisinopril -hydrochlorothiazide  (ZESTORETIC ) 20-25 MG tablet Take 1 tablet by mouth daily. 90 tablet 1   Misc. Devices MISC Raised toilet seat. Diagnosis- Osteoarthritis of the knees 1 each 0   diclofenac  Sodium (VOLTAREN ) 1 % GEL Apply 4 g topically 4 (four) times daily. (Patient not taking: Reported on  06/26/2024) 100 g 1   docusate sodium  (COLACE) 100 MG capsule Take 1 capsule (100 mg total) by mouth daily as needed. (Patient not taking: Reported on 06/26/2024) 30 capsule 2   lidocaine  (LIDODERM ) 5 % Place 1 patch onto the skin daily. Remove & Discard patch within 12 hours or as directed by MD (Patient not taking: Reported on 06/26/2024) 90 patch 1   methocarbamol  (ROBAXIN ) 750 MG tablet Take 1 tablet (750 mg total) by mouth 3 (three) times daily as needed. (Patient not taking: Reported on 06/26/2024) 30 tablet 2   ondansetron  (ZOFRAN ) 4 MG tablet Take 1 tablet (4 mg total) by mouth every 8 (eight) hours as needed for nausea or vomiting. (Patient not taking: Reported on 06/26/2024) 40 tablet 0   oxyCODONE -acetaminophen  (PERCOCET) 5-325 MG tablet Take 1-2 tablets by mouth every 6 (six) hours as  needed. To be taken after surgery (Patient not taking: Reported on 06/26/2024) 40 tablet 0   No facility-administered medications prior to visit.     ROS Review of Systems  Constitutional:  Positive for fatigue. Negative for activity change and appetite change.  HENT:  Negative for sinus pressure and sore throat.   Respiratory:  Negative for chest tightness, shortness of breath and wheezing.   Cardiovascular:  Negative for chest pain and palpitations.  Gastrointestinal:  Negative for abdominal distention, abdominal pain and constipation.  Genitourinary: Negative.   Musculoskeletal: Negative.   Neurological:  Positive for tremors and headaches.  Psychiatric/Behavioral:  Negative for behavioral problems and dysphoric mood.     Objective:  BP 109/74   Pulse 71   Ht 5' 8 (1.727 m)   Wt 232 lb (105.2 kg)   LMP 11/24/2011   SpO2 98%   BMI 35.28 kg/m      06/26/2024   10:46 AM 05/27/2024   11:00 AM 05/27/2024   10:56 AM  BP/Weight  Systolic BP 109 128 119  Diastolic BP 74 73 73  Wt. (Lbs) 232    BMI 35.28 kg/m2      Wt Readings from Last 3 Encounters:  06/26/24 232 lb (105.2 kg)  05/26/24  234 lb (106.1 kg)  05/23/24 234 lb 1.6 oz (106.2 kg)     Physical Exam Constitutional:      Appearance: She is well-developed.  Cardiovascular:     Rate and Rhythm: Normal rate.     Heart sounds: Murmur heard.  Pulmonary:     Effort: Pulmonary effort is normal.     Breath sounds: Normal breath sounds. No wheezing or rales.  Chest:     Chest wall: No tenderness.  Abdominal:     General: Bowel sounds are normal. There is no distension.     Palpations: Abdomen is soft. There is no mass.     Tenderness: There is no abdominal tenderness.  Musculoskeletal:        General: Normal range of motion.     Right lower leg: No edema.     Left lower leg: No edema.  Neurological:     Mental Status: She is alert and oriented to person, place, and time.     Coordination: Coordination abnormal (tremors).  Psychiatric:        Mood and Affect: Mood normal.        Latest Ref Rng & Units 05/19/2024   12:00 PM 01/29/2024   11:34 AM 07/31/2023   11:06 AM  CMP  Glucose 70 - 99 mg/dL 85  78  88   BUN 8 - 23 mg/dL 13  14  14    Creatinine 0.44 - 1.00 mg/dL 9.11  9.13  8.94   Sodium 135 - 145 mmol/L 140  142  142   Potassium 3.5 - 5.1 mmol/L 4.1  4.0  4.1   Chloride 98 - 111 mmol/L 102  100  100   CO2 22 - 32 mmol/L 30  29  28    Calcium  8.9 - 10.3 mg/dL 9.6  9.4  9.9   Total Protein 6.0 - 8.5 g/dL  7.2  7.2   Total Bilirubin 0.0 - 1.2 mg/dL  0.6  1.6   Alkaline Phos 44 - 121 IU/L  61  62   AST 0 - 40 IU/L  24  16   ALT 0 - 32 IU/L  15  10     Lipid Panel     Component  Value Date/Time   CHOL 147 03/13/2023 0955   TRIG 131 03/13/2023 0955   HDL 43 03/13/2023 0955   CHOLHDL 3.7 01/20/2020 0841   CHOLHDL 6.4 (H) 12/06/2016 1012   VLDL 35 (H) 12/06/2016 1012   LDLCALC 81 03/13/2023 0955    CBC    Component Value Date/Time   WBC 4.7 05/19/2024 1200   RBC 4.43 05/19/2024 1200   HGB 12.5 05/19/2024 1200   HGB 12.4 03/13/2023 0955   HCT 40.0 05/19/2024 1200   HCT 38.9 03/13/2023 0955    PLT 204 05/19/2024 1200   PLT 216 03/13/2023 0955   MCV 90.3 05/19/2024 1200   MCV 89 03/13/2023 0955   MCH 28.2 05/19/2024 1200   MCHC 31.3 05/19/2024 1200   RDW 15.2 05/19/2024 1200   RDW 14.5 03/13/2023 0955   LYMPHSABS 1.8 03/13/2023 0955   MONOABS 400 12/06/2016 1012   EOSABS 0.1 03/13/2023 0955   BASOSABS 0.0 03/13/2023 0955    Lab Results  Component Value Date   HGBA1C 5.9 (H) 01/29/2024    Lab Results  Component Value Date   TSH 1.020 03/12/2018       Assessment & Plan Intention tremor of hands Post-surgical onset of intention tremor, exacerbated by fatigue and fine motor tasks. Differential includes medication side effects and thyroid dysfunction.  She no longer takes oxycodone  - Order thyroid function tests and complete blood count. - Refer to occupational therapy for fine motor skills if labs are normal. - Advise to contact neurologist for an earlier appointment if labs are normal.  Fatigue Post-surgical fatigue with cold intolerance and tremors. Differential includes thyroid dysfunction and anemia. - Order thyroid function tests and complete blood count.  Osteoarthritis of the right knee, status post knee replacement Good recovery post knee replacement. Right leg pain due to compensatory pressure. Home therapy beneficial. - Follow up with physical therapy referral for knee rehabilitation.  Right thigh pain Right leg pain secondary to compensatory pressure post knee replacement. - Continue muscle relaxants as needed for right leg pain.        No orders of the defined types were placed in this encounter.   Follow-up: Return for previously scheduled appointment.       Corrina Sabin, MD, FAAFP. [facility] [place], [place] [phone]   06/26/2024, 1:23 PM

## 2024-06-27 ENCOUNTER — Ambulatory Visit: Payer: Self-pay | Admitting: Family Medicine

## 2024-06-27 DIAGNOSIS — R251 Tremor, unspecified: Secondary | ICD-10-CM

## 2024-06-27 LAB — CBC WITH DIFFERENTIAL/PLATELET
Basophils Absolute: 0 x10E3/uL (ref 0.0–0.2)
Basos: 1 %
EOS (ABSOLUTE): 0.1 x10E3/uL (ref 0.0–0.4)
Eos: 3 %
Hematocrit: 37.3 % (ref 34.0–46.6)
Hemoglobin: 11.6 g/dL (ref 11.1–15.9)
Immature Grans (Abs): 0 x10E3/uL (ref 0.0–0.1)
Immature Granulocytes: 0 %
Lymphocytes Absolute: 1.8 x10E3/uL (ref 0.7–3.1)
Lymphs: 43 %
MCH: 28.4 pg (ref 26.6–33.0)
MCHC: 31.1 g/dL — ABNORMAL LOW (ref 31.5–35.7)
MCV: 91 fL (ref 79–97)
Monocytes Absolute: 0.4 x10E3/uL (ref 0.1–0.9)
Monocytes: 11 %
Neutrophils Absolute: 1.7 x10E3/uL (ref 1.4–7.0)
Neutrophils: 42 %
Platelets: 316 x10E3/uL (ref 150–450)
RBC: 4.09 x10E6/uL (ref 3.77–5.28)
RDW: 15.1 % (ref 11.7–15.4)
WBC: 4 x10E3/uL (ref 3.4–10.8)

## 2024-06-27 LAB — VITAMIN D 25 HYDROXY (VIT D DEFICIENCY, FRACTURES): Vit D, 25-Hydroxy: 31.3 ng/mL (ref 30.0–100.0)

## 2024-06-27 LAB — TSH: TSH: 0.893 u[IU]/mL (ref 0.450–4.500)

## 2024-06-27 LAB — T4, FREE: Free T4: 1.31 ng/dL (ref 0.82–1.77)

## 2024-07-03 ENCOUNTER — Other Ambulatory Visit: Payer: Self-pay

## 2024-07-03 ENCOUNTER — Ambulatory Visit: Attending: Family Medicine | Admitting: Occupational Therapy

## 2024-07-03 DIAGNOSIS — R278 Other lack of coordination: Secondary | ICD-10-CM | POA: Insufficient documentation

## 2024-07-03 DIAGNOSIS — M6281 Muscle weakness (generalized): Secondary | ICD-10-CM | POA: Insufficient documentation

## 2024-07-03 DIAGNOSIS — R251 Tremor, unspecified: Secondary | ICD-10-CM | POA: Diagnosis present

## 2024-07-03 NOTE — Therapy (Signed)
 OUTPATIENT OCCUPATIONAL THERAPY NEURO EVALUATION  Patient Name: Alison Ward MRN: [id number] DOB:27-Jun-1961, 63 y.o., female Today's Date: 07/03/2024  PCP: Delbert Clam, MD REFERRING PROVIDER: Delbert Clam, MD  END OF SESSION:  OT End of Session - 07/03/24 1605     Visit Number 1    Number of Visits 10    Date for OT Re-Evaluation 08/14/24    Authorization Type Medicare Part A&B / [place] Medicaid 2025    OT Start Time 1404    OT Stop Time 1446    OT Time Calculation (min) 42 min          Past Medical History:  Diagnosis Date   Arthritis    Chronic headaches    daily, bitemporal, associated with tingling in face   Heart murmur    Hypertension    Mitral regurgitation    Pre-diabetes    Seizures (HCC)    partial seizures last one on 10/22/15  On Keppra  currently - last seizure 2021   Stroke (HCC) 07/05/2013   Tremor    in hands, more in right   Past Surgical History:  Procedure Laterality Date   ABDOMINAL HYSTERECTOMY N/A 12/15/2015   Procedure: HYSTERECTOMY ABDOMINAL;  Surgeon: Harland JAYSON Birkenhead, MD;  Location: [facility];  Service: Gynecology;  Laterality: N/A;   CESAREAN SECTION     COLONOSCOPY     POLYPECTOMY     SALPINGOOPHORECTOMY Bilateral 12/15/2015   Procedure: SALPINGO OOPHORECTOMY;  Surgeon: Harland JAYSON Birkenhead, MD;  Location: [facility];  Service: Gynecology;  Laterality: Bilateral;   TOTAL KNEE ARTHROPLASTY Left 05/26/2024   Procedure: LEFT TOTAL KNEE ARTHROPLASTY;  Surgeon: Jerri Kay HERO, MD;  Location: [facility];  Service: Orthopedics;  Laterality: Left;   VENTRICULOSTOMY Left 07/07/2013   Procedure: VENTRICULOSTOMY;  Surgeon: Reyes JONETTA Budge, MD;  Location: [facility];  Service: Neurosurgery;  Laterality: Left;  Left Ventriculostomy and Removal of Right Vetriculostomy   Patient Active Problem List   Diagnosis Date Noted   Status post total left knee replacement 05/26/2024   Acute pain of right knee 05/23/2023   Murmur, cardiac 01/24/2023   Primary  osteoarthritis of left knee 05/19/2022   Lumbar radiculopathy 03/11/2018   Ganglion cyst of right foot 07/04/2017   Chronic tension-type headache, not intractable 12/30/2015   Post-operative state 12/15/2015   Simple partial seizure disorder (HCC) 11/04/2015   Left sided numbness 09/25/2015   Leukopenia 09/25/2015   Tension vascular headache 09/07/2015   Meralgia paresthetica of left side 09/07/2015   Fainting spell 09/07/2015   Prediabetes 09/02/2015   Stye 08/06/2014   Essential hypertension, benign 04/16/2014   CVA (cerebral vascular accident) (HCC) 04/16/2014   Headache 04/16/2014   Tension headache 03/06/2014   Obesity, morbid (HCC) 08/13/2013   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   ICH (intracerebral hemorrhage) (HCC) 07/21/2013   Hypokalemia 07/21/2013   Intracerebral hemorrhage (HCC) 07/06/2013   Essential hypertension 02/24/2009    ONSET DATE: referral date 06/27/24  REFERRING DIAG: R25.1 (ICD-10-CM) - Tremor  THERAPY DIAG:  Tremor  Muscle weakness (generalized)  Other lack of coordination  Rationale for Evaluation and Treatment: Rehabilitation  SUBJECTIVE:   SUBJECTIVE STATEMENT: Pt reports tremors in B hand that started after knee surgery.  Pt reports difficulty with donning earrings, handwriting is hit or miss based on fatigue.  Pt reports difficulty with bringing cup to mouth, now using straws.  Pt reports having to modify what she eats as she is eating more hand held foods due  to tremors.  Of note, pt reports that she did have tremors prior to knee surgery about 1.5-2 years ago but that things had improved, however increase in tremors since knee surgery.  Per chart review She experiences significant tremors and fatigue since her knee replacement surgery. The tremors interfere with daily activities, including eating, drinking, and dressing, and require assistance for tasks like using the microwave. Tremors worsen with fatigue and improve with  rest. Pt accompanied by: self (friend brought pt to appt)  PERTINENT HISTORY: history of hypertension, chronic headaches, previous history of right  Hemorrhagic stroke status post ventriculostomy in 06/2013, Prediabetes, meralgia paresthetica, hyperlipidemia; post- surgical tremors s/p knee surgery  PRECAUTIONS: None  WEIGHT BEARING RESTRICTIONS: No  PAIN:  Are you having pain? No  FALLS: Has patient fallen in last 6 months? No  LIVING ENVIRONMENT: Lives with: lives with their family (adult dtr and 2 granddaughters) Lives in: House/apartment Stairs: 2 floor apt, full flight of steps with rails on B sides Has following equipment at home: Walker - 2 wheeled - not using it anymore  PLOF: Independent and Independent with basic ADLs  PATIENT GOALS: to learn strategies to increase ease and control of hand movements  OBJECTIVE:  Note: Objective measures were completed at Evaluation unless otherwise noted.  HAND DOMINANCE: Right  ADLs: Transfers/ambulation related to ADLs: initially using RW after knee surgery, no longer using Eating: difficulty with bringing cup to mouth (spilling), therefore using straw in cups and using a lot more hand held foods to decrease spillage UB Dressing: reports needing assistance to fasten buttons LB Dressing: Independent Toileting:  Independent Bathing:  Independent Tub Shower transfers:  Independent Equipment: none  IADLs: Light housekeeping: folding laundry, makes the bed and changes her sheets but it takes more effort than prior to surgery Meal Prep: daughter has been doing more of the cooking since pt had surgery, pt reports that she plans to try cooking today to see how it goes Community mobility: pt no longer drives as she reports having h/o brain surgery and seizures therefore no longer drives, does use Medicaid transportation and/or family/friends Medication management: reports that she recently started using pill box for recall of  meds Handwriting: 30.53 seconds [place];  Moderate changes in quality of letter formation due to tremors  MOBILITY STATUS: slower due to recent knee surgery  POSTURE COMMENTS:  No Significant postural limitations  ACTIVITY TOLERANCE: Activity tolerance: diminished, reports fatiguing more quickly than prior to surgery  FUNCTIONAL OUTCOME MEASURES: Physical performance test: Self-feeding: 44.47 sec, tremors increased with activity, dropping 1 bean* 3 button/unbutton: 46.90 sec  UPPER EXTREMITY ROM:    Active ROM Right eval Left eval  Shoulder flexion [facility]  Shoulder abduction [facility]  Shoulder adduction    Shoulder extension    Shoulder internal rotation [facility]  Shoulder external rotation [facility] WFL  Elbow flexion WFL WFL  Elbow extension [facility] WFL  Wrist flexion    Wrist extension    Wrist ulnar deviation    Wrist radial deviation    Wrist pronation    Wrist supination    (Blank rows = not tested)  UPPER EXTREMITY MMT:     MMT Right eval Left eval  Shoulder flexion 4 4  Shoulder abduction    Shoulder adduction    Shoulder extension    Shoulder internal rotation    Shoulder external rotation    Middle trapezius    Lower trapezius    Elbow flexion  4- (increased tremor) 4- (increased tremor)  Elbow extension 4- (increased tremor) 4- (increased tremor)  Wrist flexion    Wrist extension    Wrist ulnar deviation    Wrist radial deviation    Wrist pronation    Wrist supination    (Blank rows = not tested)  HAND FUNCTION: TBD  COORDINATION: 9 Hole Peg test: Right: 46.19 sec; Left: 63.56 sec Box and Blocks:  TBD Tremors: action, Right, Left, and increased tremors L > R with peg test  SENSATION: WFL  COGNITION: Overall cognitive status: Within functional limits for tasks assessed  VISION: Subjective report: no changes Baseline vision: Wears glasses all the time   OBSERVATIONS: Pt demonstrating tremors at rest and with activity,  noting increased tremors L > R in hand and forearm with fine motor tasks.                                                                                                                             TREATMENT DATE:  07/03/24 Educated on strategies to reduce action tremor, provided handout, and educated on recommendation to reflect on fatigue and stress and how they impact her tremors, provided with handout.        PATIENT EDUCATION: Education details: Educated on role and purpose of OT as well as potential interventions and goals for therapy based on initial evaluation findings. Person educated: Patient Education method: Explanation and Handouts Education comprehension: verbalized understanding and needs further education  HOME EXERCISE PROGRAM: TBD   GOALS: Goals reviewed with patient? Yes  SHORT TERM GOALS: Target date: 07/24/24  Pt will be independent in coordination and strengthening HEP. Baseline: new to OPOT Goal status: INITIAL  2.  Pt will verbalize understanding of task modifications and/or adaptive strategies to compensate for tremors during ADLs and fine motor control tasks.  Baseline: increased tremors with putting in earrings, handwriting, eating, pouring items, retrieving items from microwave, and buttoning Goal status: INITIAL  3.  Pt will verbalize understanding of energy conservation strategies.  Baseline: increased fatigue s/p surgery  Baseline: INITIAL   LONG TERM GOALS: Target date: 08/14/24  Pt will demonstrate improved ease with fastening buttons as evidenced by decreasing 3 button/ unbutton time to 40 seconds or less with use of AE and/or adaptive strategies. Baseline: 46.90 sec Goal status: INITIAL  2.  Pt will demonstrate improved fine motor coordination for ADLs as evidenced by decreasing 9 hole peg test score for LUE by 12 seconds and RUE by 8 seconds. Baseline: 46.19 sec; Left: 63.56 sec Goal status: INITIAL  3.  Pt will demonstrate improved  ease with feeding as evidenced by decreasing PPT#2 (self feeding) by 8 secs Baseline: 44.47 Goal status: INITIAL  4.  Pt will demonstrate improved legibility and ease with handwriting as evidenced by decreasing PPT #1 (handwriting) by 5 seconds. Baseline: 30.53 seconds Goal status: INITIAL  ASSESSMENT:  CLINICAL IMPRESSION: Patient is a 63 y.o. female who was seen today for occupational  therapy evaluation for onset of tremors in BUE s/p knee surgery.  Of note, pt does report that she has dealt with tremors prior to knee surgery but that they are significantly pronounced s/p surgery.  Pt reporting increased difficulty with self-feeding, retrieving items from microwave, handwriting, putting in earrings, managing buttons due to tremors.  Pt lives with adult daughter and granddaughters, daughter will occasionally assist with her hair and buttons on shirt as needed.  Pt will benefit from skilled occupational therapy services to address strength and coordination,GM/FM control, introduction of compensatory strategies/AE prn, and implementation of an HEP to improve participation and safety during ADLs and IADLs.    PERFORMANCE DEFICITS: in functional skills including ADLs, IADLs, coordination, strength, Fine motor control, Gross motor control, endurance, decreased knowledge of use of DME, and UE functional use and psychosocial skills including environmental adaptation and routines and behaviors.   IMPAIRMENTS: are limiting patient from ADLs and IADLs.   CO-MORBIDITIES: may have co-morbidities  that affects occupational performance. Patient will benefit from skilled OT to address above impairments and improve overall function.  MODIFICATION OR ASSISTANCE TO COMPLETE EVALUATION: No modification of tasks or assist necessary to complete an evaluation.  OT OCCUPATIONAL PROFILE AND HISTORY: Problem focused assessment: Including review of records relating to presenting problem.  CLINICAL DECISION MAKING:  LOW - limited treatment options, no task modification necessary  REHAB POTENTIAL: Good  EVALUATION COMPLEXITY: Low    PLAN:  OT FREQUENCY: 1-2x/week  OT DURATION: 6 weeks  PLANNED INTERVENTIONS: 97168 OT Re-evaluation, 97535 self care/ADL training, 02889 therapeutic exercise, 97530 therapeutic activity, 97112 neuromuscular re-education, functional mobility training, psychosocial skills training, energy conservation, coping strategies training, patient/family education, and DME and/or AE instructions  RECOMMENDED OTHER SERVICES: NA  CONSULTED AND AGREED WITH PLAN OF CARE: Patient  PLAN FOR NEXT SESSION: pour cup of water, carry items, educate on proximity of arms to body, WB, initiate coordination and/or theraband HEP   Arvis Miguez, OTR/L 07/03/2024, 4:25 PM  [facility] at [facility] [address], [address] [place], [place] [address] Phone # [phone] Fax # [phone]

## 2024-07-07 ENCOUNTER — Telehealth (HOSPITAL_BASED_OUTPATIENT_CLINIC_OR_DEPARTMENT_OTHER): Payer: Self-pay | Admitting: Family

## 2024-07-07 DIAGNOSIS — I340 Nonrheumatic mitral (valve) insufficiency: Secondary | ICD-10-CM

## 2024-07-07 NOTE — Telephone Encounter (Signed)
 Spoke with patient and she wanted to wait until October on a Tuesday or Thursday for TEE  Scheduled patient for 10/7 arrive at 6:30 for 7:30 am procedure with Dr Lonni  Patient will need follow up visit within a month of this appointment   Left message to call back

## 2024-07-07 NOTE — Telephone Encounter (Signed)
 Pt called to schedule an outpatient procedure that was discussed at the 07/25 appt with Caitlin. Patient has completed surgery and wants to know what her next steps are. Please call when you can!

## 2024-07-08 ENCOUNTER — Ambulatory Visit (INDEPENDENT_AMBULATORY_CARE_PROVIDER_SITE_OTHER): Admitting: Physician Assistant

## 2024-07-08 ENCOUNTER — Other Ambulatory Visit (INDEPENDENT_AMBULATORY_CARE_PROVIDER_SITE_OTHER): Payer: Self-pay

## 2024-07-08 ENCOUNTER — Encounter: Payer: Self-pay | Admitting: Physician Assistant

## 2024-07-08 DIAGNOSIS — Z96652 Presence of left artificial knee joint: Secondary | ICD-10-CM

## 2024-07-08 NOTE — Progress Notes (Addendum)
 Post-Op Visit Note   Patient: Alison Ward           Date of Birth: 08-31-1961           MRN: [id number] Visit Date: 07/08/2024 PCP: Delbert Clam, MD   Assessment & Plan:  Chief Complaint:  Chief Complaint  Patient presents with   Left Knee - Follow-up    Left total knee arthroplasty 05/26/2024   Visit Diagnoses:  1. Status post total left knee replacement     Plan: Patient is a pleasant 63 year old female who comes in today 6 weeks status post left total knee replacement 05/26/2024.  She has been doing well.  She is not in any pain but she does note some discomfort getting in and out of a car.  She has been compliant taking her aspirin  twice daily for DVT prophylaxis.  She has not been to physical therapy yet.  Examination of the left knee reveals range of motion from 0 to 90 degrees.  She is stable to valgus and varus stress.  She is neurovascularly intact distally.  At this point, I put in a new referral for outpatient PT.  If she does not hear anything within the next week she will call and let us  know.  She will continue work on her home exercise program in the meantime.  She may discontinue taking the baby aspirin  for DVT prophylaxis.  Follow-up with us  in 4 weeks for recheck and to possibly discuss manipulation under anesthesia.  Call with concerns or questions.  Follow-Up Instructions: Return in about 4 weeks (around 08/05/2024).   Orders:  Orders Placed This Encounter  Procedures   XR Knee 1-2 Views Left   Ambulatory referral to Physical Therapy   No orders of the defined types were placed in this encounter.   Imaging: XR Knee 1-2 Views Left Result Date: 07/08/2024 Well-seated prosthesis without complication   PMFS History: Patient Active Problem List   Diagnosis Date Noted   Status post total left knee replacement 05/26/2024   Acute pain of right knee 05/23/2023   Murmur, cardiac 01/24/2023   Primary osteoarthritis of left knee 05/19/2022   Lumbar  radiculopathy 03/11/2018   Ganglion cyst of right foot 07/04/2017   Chronic tension-type headache, not intractable 12/30/2015   Post-operative state 12/15/2015   Simple partial seizure disorder (HCC) 11/04/2015   Left sided numbness 09/25/2015   Leukopenia 09/25/2015   Tension vascular headache 09/07/2015   Meralgia paresthetica of left side 09/07/2015   Fainting spell 09/07/2015   Prediabetes 09/02/2015   Stye 08/06/2014   Essential hypertension, benign 04/16/2014   CVA (cerebral vascular accident) (HCC) 04/16/2014   Headache 04/16/2014   Tension headache 03/06/2014   Obesity, morbid (HCC) 08/13/2013   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   ICH (intracerebral hemorrhage) (HCC) 07/21/2013   Hypokalemia 07/21/2013   Intracerebral hemorrhage (HCC) 07/06/2013   Essential hypertension 02/24/2009   Past Medical History:  Diagnosis Date   Arthritis    Chronic headaches    daily, bitemporal, associated with tingling in face   Heart murmur    Hypertension    Mitral regurgitation    Pre-diabetes    Seizures (HCC)    partial seizures last one on 10/22/15  On Keppra  currently - last seizure 2021   Stroke (HCC) 07/05/2013   Tremor    in hands, more in right    Family History  Problem Relation Age of Onset   Hypertension Mother    Diabetes  Mother    Stroke Mother    Hypertension Father    Colon cancer Neg Hx    Breast cancer Neg Hx    Colon polyps Neg Hx    Esophageal cancer Neg Hx    Rectal cancer Neg Hx    Stomach cancer Neg Hx     Past Surgical History:  Procedure Laterality Date   ABDOMINAL HYSTERECTOMY N/A 12/15/2015   Procedure: HYSTERECTOMY ABDOMINAL;  Surgeon: Harland JAYSON Birkenhead, MD;  Location: [facility];  Service: Gynecology;  Laterality: N/A;   CESAREAN SECTION     COLONOSCOPY     POLYPECTOMY     SALPINGOOPHORECTOMY Bilateral 12/15/2015   Procedure: SALPINGO OOPHORECTOMY;  Surgeon: Harland JAYSON Birkenhead, MD;  Location: [facility];  Service: Gynecology;  Laterality:  Bilateral;   TOTAL KNEE ARTHROPLASTY Left 05/26/2024   Procedure: LEFT TOTAL KNEE ARTHROPLASTY;  Surgeon: Jerri Kay HERO, MD;  Location: [facility];  Service: Orthopedics;  Laterality: Left;   VENTRICULOSTOMY Left 07/07/2013   Procedure: VENTRICULOSTOMY;  Surgeon: Reyes JONETTA Budge, MD;  Location: [facility];  Service: Neurosurgery;  Laterality: Left;  Left Ventriculostomy and Removal of Right Vetriculostomy   Social History   Occupational History   Occupation: [employer]  Tobacco Use   Smoking status: Never   Smokeless tobacco: Never  Vaping Use   Vaping status: Never Used  Substance and Sexual Activity   Alcohol use: No   Drug use: No   Sexual activity: Not Currently    Birth control/protection: Surgical

## 2024-07-09 ENCOUNTER — Ambulatory Visit: Admitting: Cardiology

## 2024-07-10 NOTE — Telephone Encounter (Signed)
 Pt returning nurse's call. Please advise

## 2024-07-10 NOTE — Telephone Encounter (Signed)
 Spoke with patient and advised of appointment with Reche ORN NP 9/16

## 2024-07-10 NOTE — Telephone Encounter (Signed)
 Left message to call back and sent mychart message Patient scheduled for visit 9/16 at 1:55 pm

## 2024-07-15 ENCOUNTER — Ambulatory Visit (HOSPITAL_BASED_OUTPATIENT_CLINIC_OR_DEPARTMENT_OTHER): Admitting: Family

## 2024-07-15 ENCOUNTER — Encounter (HOSPITAL_BASED_OUTPATIENT_CLINIC_OR_DEPARTMENT_OTHER): Payer: Self-pay | Admitting: Family

## 2024-07-15 ENCOUNTER — Ambulatory Visit: Admitting: Occupational Therapy

## 2024-07-15 VITALS — BP 118/78 | HR 66 | Resp 17 | Ht 68.0 in | Wt 236.0 lb

## 2024-07-15 DIAGNOSIS — I10 Essential (primary) hypertension: Secondary | ICD-10-CM

## 2024-07-15 DIAGNOSIS — I351 Nonrheumatic aortic (valve) insufficiency: Secondary | ICD-10-CM | POA: Diagnosis not present

## 2024-07-15 DIAGNOSIS — I340 Nonrheumatic mitral (valve) insufficiency: Secondary | ICD-10-CM | POA: Diagnosis not present

## 2024-07-15 NOTE — H&P (View-Only) (Signed)
 Cardiology Office Note   Date:  07/20/2024  ID:  Alison Ward, DOB Mar 04, 1961, MRN [id number] PCP: Delbert Clam, MD  [facility] Providers Cardiologist:  Aleene Passe, MD (Inactive) Cardiology APP:  Alison Ward RAMAN, NP     History of Present Illness Alison Ward is a 63 y.o. female with history of mitral regurgitation, HTN, hemorrhagic stroke ~2014, HLD, seizures.  Echo 2014 trivia MR. She recalls having TEE many years ago, but no results available.   Last seen 05/15/2023 by Dr. Passe with echo revealing moderate mitral regurgitation with suspected degree of mitral valve prolapse.  She had nonspecific T wave abnormalities which were suspected to be related due to cardiac enlargement and strain.  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  She was seen 05/23/24 and asymptomatic in regards to her severe MR. She was given clearance for knee surgery with plans to workup MR after knee surgery.   Presents today for follow-up independently.  Pleasant lady who is originally from [place]. [facility]  She is s/p left total knee arthroplasty and recovering well. . Enjoys spending time with her grandchildren. Reports no shortness of breath nor dyspnea on exertion. Reports no chest pain, pressure, or tightness. No edema, orthopnea, PND. Reports no palpitations.  We reviewed most recent echocardiogram and plan for TEE.   ROS: Please see the history of present illness.    All other systems reviewed and are negative.   Studies Reviewed EKG Interpretation Date/Time:  Tuesday July 15 2024 13:47:51 EDT Ventricular Rate:  69 PR Interval:  156 QRS  Duration:  92 QT Interval:  400 QTC Calculation: 428 R Axis:   7  Text Interpretation: Normal sinus rhythm Normal ECG Confirmed by Alison Ward ([id number]) on 07/15/2024 1:57:38 PM    Cardiac Studies & Procedures   ______________________________________________________________________________________________     ECHOCARDIOGRAM  ECHOCARDIOGRAM COMPLETE 05/14/2024  Narrative ECHOCARDIOGRAM REPORT    Patient Name:   Alison Ward Date of Exam: 05/14/2024 Medical Rec #:  [id number]                Height:       66.7 in Accession #:    [id number]               Weight:       237.6 lb Date of Birth:  [birth year]                BSA:          2.169 m Patient Age:    63 years                 BP:           119/79 mmHg Patient Gender: F                        HR:           61 bpm. Exam Location:  [address]  Procedure: 2D Echo, 3D Echo, Cardiac Doppler and Color Doppler (Both Spectral and Color Flow Doppler were utilized during procedure).  Indications:     R01.1 Murmur  History:         Patient has prior history of Echocardiogram  examinations, most recent 03/19/2023. Stroke, Mitral Valve Disease, Signs/Symptoms:Murmur; Risk Factors:Family History of Coronary Artery Disease and Hypertension. Headaches.  Sonographer:     Heather Hawks RDCS Referring Phys:  ALEENE JINNY PASSE Diagnosing Phys: Morene Brownie  IMPRESSIONS   1. Left ventricular ejection fraction, by estimation, is 60 to 65%. Left ventricular ejection fraction by 3D volume is 67 %. The left ventricle has normal function. The left ventricle has no regional wall motion abnormalities. Left ventricular diastolic parameters were normal. 2. Right ventricular systolic function is normal. The right ventricular size is normal. There is mildly elevated pulmonary artery systolic pressure. The estimated right ventricular systolic pressure is 43.7 mmHg. 3. Left atrial size was severely dilated. 4. Large, eccentric,  anteriorally directed jet that wraps the left atrium with significant splay. Suspect likely severe, TEE may be helpful for further characterization. The mitral valve is normal in structure. Severe mitral valve regurgitation. No evidence of mitral stenosis. 5. Increased aortic valve gradients measured, but the valve appears thin, no calcifications, and opens well. Suspect measured gradient from eccentric MR along CW interrogation line. The aortic valve is tricuspid. Aortic valve regurgitation is mild to moderate. No aortic stenosis is present. 6. 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 7. The inferior vena cava is normal in size with greater than 50% respiratory variability, suggesting right atrial pressure of 3 mmHg.  FINDINGS Left Ventricle: Left ventricular ejection fraction, by estimation, is 60 to 65%. Left ventricular ejection fraction by 3D volume is 67 %. The left ventricle has normal function. The left ventricle has no regional wall motion abnormalities. The left ventricular internal cavity size was normal in size. There is no left ventricular hypertrophy. Left ventricular diastolic parameters were normal.  Right Ventricle: The right ventricular size is normal. No increase in right ventricular wall thickness. Right ventricular systolic function is normal. There is mildly elevated pulmonary artery systolic pressure. The tricuspid regurgitant velocity is 3.19 m/s, and with an assumed right atrial pressure of 3 mmHg, the estimated right ventricular systolic pressure is 43.7 mmHg.  Left Atrium: Left atrial size was severely dilated.  Right Atrium: Right atrial size was normal in size.  Pericardium: There is no evidence of pericardial effusion.  Mitral Valve: Large, eccentric, anteriorally directed jet that wraps the left atrium with significant splay. Suspect likely severe, TEE may be helpful for further characterization. The mitral valve is normal in structure. Severe mitral  valve regurgitation. No evidence of mitral valve stenosis.  Tricuspid Valve: The tricuspid valve is normal in structure. Tricuspid valve regurgitation is mild . No evidence of tricuspid stenosis.  Aortic Valve: Increased aortic valve gradients measured, but the valve appears thin, no calcifications, and opens well. Suspect measured gradient from eccentric MR along CW interrogation line. The aortic valve is tricuspid. Aortic valve regurgitation is mild to moderate. Aortic regurgitation PHT measures 613 msec. No aortic stenosis is present. Aortic valve mean gradient measures 34.0 mmHg. Aortic valve peak gradient measures 58.8 mmHg. Aortic valve area, by VTI measures 1.06 cm.  Pulmonic Valve: The pulmonic valve was normal in structure. Pulmonic valve regurgitation is mild. 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  Aorta: The aortic root is normal in size and structure.  Venous: The inferior vena cava is normal in size with greater than 50% respiratory variability, suggesting right atrial pressure of 3 mmHg.  IAS/Shunts: No atrial level shunt detected by color flow Doppler.  Additional Comments: 3D was performed not requiring image post processing on an independent workstation and was normal.  LEFT VENTRICLE PLAX 2D LVIDd:         4.40 cm         Diastology LVIDs:         3.10 cm         LV e' medial:    11.70 cm/s LV PW:         1.40 cm         LV E/e' medial:  11.5 LV IVS:        1.10 cm         LV e' lateral:   13.20 cm/s LVOT diam:     2.60 cm         LV E/e' lateral: 10.2 LV SV:         111 LV SV Index:   51 LVOT Area:     5.31 cm        3D Volume EF LV 3D EF:    Left ventricul ar ejection fraction by 3D volume is 67 %.  3D Volume EF: 3D EF:        67 % LV EDV:       150 ml LV ESV:       49 ml LV SV:        101 ml  RIGHT VENTRICLE RV Basal diam:  3.50 cm RV S prime:     14.80 cm/s TAPSE (M-mode): 2.5 cm RVSP:           43.7 mmHg  LEFT ATRIUM              Index         RIGHT ATRIUM           Index LA diam:        3.60 cm  1.66 cm/m   RA Pressure: 3.00 mmHg LA Vol (A2C):   109.0 ml 50.24 ml/m  RA Area:     21.00 cm LA Vol (A4C):   133.0 ml 61.31 ml/m  RA Volume:   68.90 ml  31.76 ml/m LA Biplane Vol: 129.0 ml 59.46 ml/m AORTIC VALVE                     PULMONIC VALVE AV Area (Vmax):    1.48 cm      PV Vmax:       2.34 m/s AV Area (Vmean):   1.33 cm      PV Vmean:      157.600 cm/s AV Area (VTI):     1.06 cm      PV VTI:        0.580 m AV Vmax:           383.25 cm/s   PV Peak grad:  22.0 mmHg AV Vmean:          269.250 cm/s  PV Mean grad:  11.8 mmHg AV VTI:            1.045 m AV Peak Grad:      58.8 mmHg AV Mean Grad:      34.0 mmHg LVOT Vmax:         107.00 cm/s LVOT Vmean:        67.400 cm/s LVOT VTI:          0.209 m LVOT/AV VTI ratio: 0.20 AI PHT:            613 msec  AORTA Ao Root diam: 3.20 cm Ao Asc diam:  3.80 cm  MITRAL VALVE  TRICUSPID VALVE MV Area (PHT)  cm          TR Peak grad:   40.7 mmHg MV Decel Time: 224 msec     TR Vmax:        319.00 cm/s MV E velocity: 134.50 cm/s  Estimated RAP:  3.00 mmHg MV A velocity: 91.80 cm/s   RVSP:           43.7 mmHg MV E/A ratio:  1.47 SHUNTS Systemic VTI:  0.21 m Systemic Diam: 2.60 cm  Morene Brownie Electronically signed by Morene Brownie Signature Date/Time: 05/14/2024/5:58:12 PM    Final (Updated)          ______________________________________________________________________________________________        Risk Assessment/Calculations          Physical Exam VS:  BP 118/78 (BP Location: Left Arm, Patient Position: Sitting, Cuff Size: Large)   Pulse 66   Resp 17   Ht 5' 8 (1.727 m)   Wt 236 lb (107 kg)   LMP 11/24/2011   SpO2 99%   BMI 35.88 kg/m        Wt Readings from Last 3 Encounters:  07/15/24 236 lb (107 kg)  06/26/24 232 lb (105.2 kg)  05/26/24 234 lb (106.1 kg)    GEN: Well nourished, well developed in no acute distress NECK: No  JVD; No carotid bruits CARDIAC: RRR, no  rubs, gallops. 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 She is asymptomatic in regards to her MR/AI. No evidence of heart failure. Plan for TEE and referral to valve team for further evaluation.     Informed Consent   Shared Decision Making/Informed Consent   The risks [esophageal damage, perforation (1:10,000 risk), bleeding, pharyngeal hematoma as well as other potential complications associated with conscious sedation including aspiration, arrhythmia, respiratory failure and death], benefits (treatment guidance and diagnostic support) and alternatives of a transesophageal echocardiogram were discussed in detail with Ms. Tooker and she is willing to proceed.      Dispo: follow up with valve team post TEE and in 4 mos with Ward GORMAN Finder, NP   Signed, Ward GORMAN Finder, NP

## 2024-07-15 NOTE — Progress Notes (Signed)
 Cardiology Office Note   Date:  07/20/2024  ID:  Alison Ward, DOB Mar 04, 1961, MRN [id number] PCP: Delbert Clam, MD  [facility] Providers Cardiologist:  Aleene Passe, MD (Inactive) Cardiology APP:  Vannie Reche RAMAN, NP     History of Present Illness Alison Ward is a 63 y.o. female with history of mitral regurgitation, HTN, hemorrhagic stroke ~2014, HLD, seizures.  Echo 2014 trivia MR. She recalls having TEE many years ago, but no results available.   Last seen 05/15/2023 by Dr. Passe with echo revealing moderate mitral regurgitation with suspected degree of mitral valve prolapse.  She had nonspecific T wave abnormalities which were suspected to be related due to cardiac enlargement and strain.  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  She was seen 05/23/24 and asymptomatic in regards to her severe MR. She was given clearance for knee surgery with plans to workup MR after knee surgery.   Presents today for follow-up independently.  Pleasant lady who is originally from [place]. [facility] ([place]).  She is s/p left total knee arthroplasty and recovering well. . Enjoys spending time with her grandchildren. Reports no shortness of breath nor dyspnea on exertion. Reports no chest pain, pressure, or tightness. No edema, orthopnea, PND. Reports no palpitations.  We reviewed most recent echocardiogram and plan for TEE.   ROS: Please see the history of present illness.    All other systems reviewed and are negative.   Studies Reviewed EKG Interpretation Date/Time:  Tuesday July 15 2024 13:47:51 EDT Ventricular Rate:  69 PR Interval:  156 QRS  Duration:  92 QT Interval:  400 QTC Calculation: 428 R Axis:   7  Text Interpretation: Normal sinus rhythm Normal ECG Confirmed by Vannie Reche ([id number]) on 07/15/2024 1:57:38 PM    Cardiac Studies & Procedures   ______________________________________________________________________________________________     ECHOCARDIOGRAM  ECHOCARDIOGRAM COMPLETE 05/14/2024  Narrative ECHOCARDIOGRAM REPORT    Patient Name:   Alison Ward Date of Exam: 05/14/2024 Medical Rec #:  [id number]                Height:       66.7 in Accession #:    [id number]               Weight:       237.6 lb Date of Birth:  [birth year]                BSA:          2.169 m Patient Age:    63 years                 BP:           119/79 mmHg Patient Gender: F                        HR:           61 bpm. Exam Location:  [address]  Procedure: 2D Echo, 3D Echo, Cardiac Doppler and Color Doppler (Both Spectral and Color Flow Doppler were utilized during procedure).  Indications:     R01.1 Murmur  History:         Patient has prior history of Echocardiogram  examinations, most recent 03/19/2023. Stroke, Mitral Valve Disease, Signs/Symptoms:Murmur; Risk Factors:Family History of Coronary Artery Disease and Hypertension. Headaches.  Sonographer:     Heather Hawks RDCS Referring Phys:  ALEENE JINNY PASSE Diagnosing Phys: Morene Brownie  IMPRESSIONS   1. Left ventricular ejection fraction, by estimation, is 60 to 65%. Left ventricular ejection fraction by 3D volume is 67 %. The left ventricle has normal function. The left ventricle has no regional wall motion abnormalities. Left ventricular diastolic parameters were normal. 2. Right ventricular systolic function is normal. The right ventricular size is normal. There is mildly elevated pulmonary artery systolic pressure. The estimated right ventricular systolic pressure is 43.7 mmHg. 3. Left atrial size was severely dilated. 4. Large, eccentric,  anteriorally directed jet that wraps the left atrium with significant splay. Suspect likely severe, TEE may be helpful for further characterization. The mitral valve is normal in structure. Severe mitral valve regurgitation. No evidence of mitral stenosis. 5. Increased aortic valve gradients measured, but the valve appears thin, no calcifications, and opens well. Suspect measured gradient from eccentric MR along CW interrogation line. The aortic valve is tricuspid. Aortic valve regurgitation is mild to moderate. No aortic stenosis is present. 6. 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 7. The inferior vena cava is normal in size with greater than 50% respiratory variability, suggesting right atrial pressure of 3 mmHg.  FINDINGS Left Ventricle: Left ventricular ejection fraction, by estimation, is 60 to 65%. Left ventricular ejection fraction by 3D volume is 67 %. The left ventricle has normal function. The left ventricle has no regional wall motion abnormalities. The left ventricular internal cavity size was normal in size. There is no left ventricular hypertrophy. Left ventricular diastolic parameters were normal.  Right Ventricle: The right ventricular size is normal. No increase in right ventricular wall thickness. Right ventricular systolic function is normal. There is mildly elevated pulmonary artery systolic pressure. The tricuspid regurgitant velocity is 3.19 m/s, and with an assumed right atrial pressure of 3 mmHg, the estimated right ventricular systolic pressure is 43.7 mmHg.  Left Atrium: Left atrial size was severely dilated.  Right Atrium: Right atrial size was normal in size.  Pericardium: There is no evidence of pericardial effusion.  Mitral Valve: Large, eccentric, anteriorally directed jet that wraps the left atrium with significant splay. Suspect likely severe, TEE may be helpful for further characterization. The mitral valve is normal in structure. Severe mitral  valve regurgitation. No evidence of mitral valve stenosis.  Tricuspid Valve: The tricuspid valve is normal in structure. Tricuspid valve regurgitation is mild . No evidence of tricuspid stenosis.  Aortic Valve: Increased aortic valve gradients measured, but the valve appears thin, no calcifications, and opens well. Suspect measured gradient from eccentric MR along CW interrogation line. The aortic valve is tricuspid. Aortic valve regurgitation is mild to moderate. Aortic regurgitation PHT measures 613 msec. No aortic stenosis is present. Aortic valve mean gradient measures 34.0 mmHg. Aortic valve peak gradient measures 58.8 mmHg. Aortic valve area, by VTI measures 1.06 cm.  Pulmonic Valve: The pulmonic valve was normal in structure. Pulmonic valve regurgitation is mild. 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  Aorta: The aortic root is normal in size and structure.  Venous: The inferior vena cava is normal in size with greater than 50% respiratory variability, suggesting right atrial pressure of 3 mmHg.  IAS/Shunts: No atrial level shunt detected by color flow Doppler.  Additional Comments: 3D was performed not requiring image post processing on an independent workstation and was normal.  LEFT VENTRICLE PLAX 2D LVIDd:         4.40 cm         Diastology LVIDs:         3.10 cm         LV e' medial:    11.70 cm/s LV PW:         1.40 cm         LV E/e' medial:  11.5 LV IVS:        1.10 cm         LV e' lateral:   13.20 cm/s LVOT diam:     2.60 cm         LV E/e' lateral: 10.2 LV SV:         111 LV SV Index:   51 LVOT Area:     5.31 cm        3D Volume EF LV 3D EF:    Left ventricul ar ejection fraction by 3D volume is 67 %.  3D Volume EF: 3D EF:        67 % LV EDV:       150 ml LV ESV:       49 ml LV SV:        101 ml  RIGHT VENTRICLE RV Basal diam:  3.50 cm RV S prime:     14.80 cm/s TAPSE (M-mode): 2.5 cm RVSP:           43.7 mmHg  LEFT ATRIUM              Index         RIGHT ATRIUM           Index LA diam:        3.60 cm  1.66 cm/m   RA Pressure: 3.00 mmHg LA Vol (A2C):   109.0 ml 50.24 ml/m  RA Area:     21.00 cm LA Vol (A4C):   133.0 ml 61.31 ml/m  RA Volume:   68.90 ml  31.76 ml/m LA Biplane Vol: 129.0 ml 59.46 ml/m AORTIC VALVE                     PULMONIC VALVE AV Area (Vmax):    1.48 cm      PV Vmax:       2.34 m/s AV Area (Vmean):   1.33 cm      PV Vmean:      157.600 cm/s AV Area (VTI):     1.06 cm      PV VTI:        0.580 m AV Vmax:           383.25 cm/s   PV Peak grad:  22.0 mmHg AV Vmean:          269.250 cm/s  PV Mean grad:  11.8 mmHg AV VTI:            1.045 m AV Peak Grad:      58.8 mmHg AV Mean Grad:      34.0 mmHg LVOT Vmax:         107.00 cm/s LVOT Vmean:        67.400 cm/s LVOT VTI:          0.209 m LVOT/AV VTI ratio: 0.20 AI PHT:            613 msec  AORTA Ao Root diam: 3.20 cm Ao Asc diam:  3.80 cm  MITRAL VALVE  TRICUSPID VALVE MV Area (PHT)  cm          TR Peak grad:   40.7 mmHg MV Decel Time: 224 msec     TR Vmax:        319.00 cm/s MV E velocity: 134.50 cm/s  Estimated RAP:  3.00 mmHg MV A velocity: 91.80 cm/s   RVSP:           43.7 mmHg MV E/A ratio:  1.47 SHUNTS Systemic VTI:  0.21 m Systemic Diam: 2.60 cm  Morene Brownie Electronically signed by Morene Brownie Signature Date/Time: 05/14/2024/5:58:12 PM    Final (Updated)          ______________________________________________________________________________________________        Risk Assessment/Calculations          Physical Exam VS:  BP 118/78 (BP Location: Left Arm, Patient Position: Sitting, Cuff Size: Large)   Pulse 66   Resp 17   Ht 5' 8 (1.727 m)   Wt 236 lb (107 kg)   LMP 11/24/2011   SpO2 99%   BMI 35.88 kg/m        Wt Readings from Last 3 Encounters:  07/15/24 236 lb (107 kg)  06/26/24 232 lb (105.2 kg)  05/26/24 234 lb (106.1 kg)    GEN: Well nourished, well developed in no acute distress NECK: No  JVD; No carotid bruits CARDIAC: RRR, no  rubs, gallops. 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 She is asymptomatic in regards to her MR/AI. No evidence of heart failure. Plan for TEE and referral to valve team for further evaluation.     Informed Consent   Shared Decision Making/Informed Consent   The risks [esophageal damage, perforation (1:10,000 risk), bleeding, pharyngeal hematoma as well as other potential complications associated with conscious sedation including aspiration, arrhythmia, respiratory failure and death], benefits (treatment guidance and diagnostic support) and alternatives of a transesophageal echocardiogram were discussed in detail with Alison Ward and she is willing to proceed.      Dispo: follow up with valve team post TEE and in 4 mos with Reche GORMAN Finder, NP   Signed, Reche GORMAN Finder, NP

## 2024-07-15 NOTE — Patient Instructions (Signed)
 Medication Instructions:   Your physician recommends that you continue on your current medications as directed. Please refer to the Current Medication list given to you today.  *If you need a refill on your cardiac medications before your next appointment, please call your pharmacy*   You have been referred to --Ambulatory referral to [facility] --YOU WILL GET A CALL BACK FROM OUR STRUCTURAL RN NAVIGATORS TO ARRANGE THIS APPOINTMENT AFTER WE GET THE RESULTS FROM YOUR SCHEDULED TEE--   Lab:  Today on the 3rd floor at [address] [facility]---BMET AND CBC   Testing/Procedures:      Dear Alison Ward    You are scheduled for a TEE (Transesophageal Echocardiogram) on Tuesday, October 7 with Dr. LONNI.  Please arrive at the [facility] (Main Entrance A) at [facility]: [address] [place], [place] [address] at 6:30 AM (This time is 1 hour(s) before your procedure to ensure your preparation).    Free valet parking service is available. You will check in at ADMITTING.    *Please Note: You will receive a call the day before your procedure to confirm the appointment time. That time may have changed from the original time based on the schedule for that day.*    DIET:  Nothing to eat or drink after midnight except a sip of water with medications (see medication instructions below)   MEDICATION INSTRUCTIONS: !!IF ANY NEW MEDICATIONS ARE STARTED AFTER TODAY, PLEASE NOTIFY YOUR PROVIDER AS SOON AS POSSIBLE!!  FYI: Medications such as Semaglutide (Ozempic, [place]), Tirzepatide (Mounjaro, Zepbound), Dulaglutide (Trulicity), etc (GLP1 agonists) AND Canagliflozin (Invokana), Dapagliflozin (Farxiga), Empagliflozin (Jardiance), Ertugliflozin (Steglatro), Bexagliflozin [employer]) or any combination with one of these drugs such as Invokamet (Canagliflozin/Metformin), Synjardy (Empagliflozin/Metformin), etc (SGLT2 inhibitors) must be held around the time  of a procedure. This is not a comprehensive list of all of these drugs. Please review all of your medications and talk to your provider if you take any one of these. If you are not sure, ask your provider.  LABS: Come to [facility] OR ANY [facility] 1 WEEK PRIOR.    FYI:  For your safety, and to allow us  to monitor your vital signs accurately during the surgery/procedure we request: If you have artificial nails, gel coating, SNS etc, please have those removed prior to your surgery/procedure. Not having the nail coverings /polish removed may result in cancellation or delay of your surgery/procedure.   Your support person will be asked to wait in the waiting room during your procedure.  It is OK to have someone drop you off and come back when you are ready to be discharged.  You cannot drive after the procedure and will need someone to drive you home.   Bring your insurance cards.   *Special Note: Every effort is made to have your procedure done on time. Occasionally there are emergencies that occur at the hospital that may cause delays. Please be patient if a delay does occur.    Follow-Up:  4 MONTHS WITH DR. LONNI OR CAITLIN WALKER, NP

## 2024-07-16 ENCOUNTER — Ambulatory Visit (HOSPITAL_BASED_OUTPATIENT_CLINIC_OR_DEPARTMENT_OTHER): Payer: Self-pay | Admitting: Family

## 2024-07-16 LAB — CBC WITH DIFFERENTIAL/PLATELET
Basophils Absolute: 0 x10E3/uL (ref 0.0–0.2)
Basos: 1 %
EOS (ABSOLUTE): 0.1 x10E3/uL (ref 0.0–0.4)
Eos: 2 %
Hematocrit: 35.4 % (ref 34.0–46.6)
Hemoglobin: 11.4 g/dL (ref 11.1–15.9)
Immature Grans (Abs): 0 x10E3/uL (ref 0.0–0.1)
Immature Granulocytes: 0 %
Lymphocytes Absolute: 2.1 x10E3/uL (ref 0.7–3.1)
Lymphs: 51 %
MCH: 29.1 pg (ref 26.6–33.0)
MCHC: 32.2 g/dL (ref 31.5–35.7)
MCV: 90 fL (ref 79–97)
Monocytes Absolute: 0.4 x10E3/uL (ref 0.1–0.9)
Monocytes: 9 %
Neutrophils Absolute: 1.5 x10E3/uL (ref 1.4–7.0)
Neutrophils: 37 %
Platelets: 265 x10E3/uL (ref 150–450)
RBC: 3.92 x10E6/uL (ref 3.77–5.28)
RDW: 14.8 % (ref 11.7–15.4)
WBC: 4.1 x10E3/uL (ref 3.4–10.8)

## 2024-07-16 LAB — BASIC METABOLIC PANEL WITH GFR
BUN/Creatinine Ratio: 13 (ref 12–28)
BUN: 12 mg/dL (ref 8–27)
CO2: 28 mmol/L (ref 20–29)
Calcium: 9.8 mg/dL (ref 8.7–10.3)
Chloride: 99 mmol/L (ref 96–106)
Creatinine, Ser: 0.89 mg/dL (ref 0.57–1.00)
Glucose: 88 mg/dL (ref 70–99)
Potassium: 3.6 mmol/L (ref 3.5–5.2)
Sodium: 140 mmol/L (ref 134–144)
eGFR: 72 mL/min/1.73 (ref >59)

## 2024-07-17 ENCOUNTER — Ambulatory Visit: Admitting: Occupational Therapy

## 2024-07-17 ENCOUNTER — Other Ambulatory Visit: Payer: Self-pay

## 2024-07-17 ENCOUNTER — Ambulatory Visit: Attending: Physician Assistant | Admitting: Physical Therapy

## 2024-07-17 ENCOUNTER — Other Ambulatory Visit: Payer: Self-pay | Admitting: Adult Health

## 2024-07-17 ENCOUNTER — Encounter: Payer: Self-pay | Admitting: Physical Therapy

## 2024-07-17 DIAGNOSIS — Z96652 Presence of left artificial knee joint: Secondary | ICD-10-CM | POA: Diagnosis not present

## 2024-07-17 DIAGNOSIS — M25662 Stiffness of left knee, not elsewhere classified: Secondary | ICD-10-CM | POA: Insufficient documentation

## 2024-07-17 DIAGNOSIS — I619 Nontraumatic intracerebral hemorrhage, unspecified: Secondary | ICD-10-CM

## 2024-07-17 DIAGNOSIS — M6281 Muscle weakness (generalized): Secondary | ICD-10-CM | POA: Insufficient documentation

## 2024-07-17 DIAGNOSIS — R262 Difficulty in walking, not elsewhere classified: Secondary | ICD-10-CM | POA: Insufficient documentation

## 2024-07-17 DIAGNOSIS — G40109 Localization-related (focal) (partial) symptomatic epilepsy and epileptic syndromes with simple partial seizures, not intractable, without status epilepticus: Secondary | ICD-10-CM

## 2024-07-17 DIAGNOSIS — M25562 Pain in left knee: Secondary | ICD-10-CM | POA: Diagnosis present

## 2024-07-17 NOTE — Therapy (Signed)
 OUTPATIENT PHYSICAL THERAPY LOWER EXTREMITY EVALUATION   Patient Name: Alison Ward MRN: [id number] DOB:Sep 26, 1961, 63 y.o., female Today's Date: 07/17/2024  END OF SESSION:  PT End of Session - 07/17/24 1450     Visit Number 1    Date for Recertification  09/11/24    Authorization Type Medicare/ Medicaid    PT Start Time 1446    PT Stop Time 1529    PT Time Calculation (min) 43 min    Activity Tolerance Patient tolerated treatment well          Past Medical History:  Diagnosis Date   Arthritis    Chronic headaches    daily, bitemporal, associated with tingling in face   Heart murmur    Hypertension    Mitral regurgitation    Pre-diabetes    Seizures (HCC)    partial seizures last one on 10/22/15  On Keppra  currently - last seizure 2021   Stroke (HCC) 07/05/2013   Tremor    in hands, more in right   Past Surgical History:  Procedure Laterality Date   ABDOMINAL HYSTERECTOMY N/A 12/15/2015   Procedure: HYSTERECTOMY ABDOMINAL;  Surgeon: Harland JAYSON Birkenhead, MD;  Location: [facility];  Service: Gynecology;  Laterality: N/A;   CESAREAN SECTION     COLONOSCOPY     POLYPECTOMY     SALPINGOOPHORECTOMY Bilateral 12/15/2015   Procedure: SALPINGO OOPHORECTOMY;  Surgeon: Harland JAYSON Birkenhead, MD;  Location: [facility];  Service: Gynecology;  Laterality: Bilateral;   TOTAL KNEE ARTHROPLASTY Left 05/26/2024   Procedure: LEFT TOTAL KNEE ARTHROPLASTY;  Surgeon: Jerri Kay HERO, MD;  Location: [facility];  Service: Orthopedics;  Laterality: Left;   VENTRICULOSTOMY Left 07/07/2013   Procedure: VENTRICULOSTOMY;  Surgeon: Reyes JONETTA Budge, MD;  Location: [facility];  Service: Neurosurgery;  Laterality: Left;  Left Ventriculostomy and Removal of Right Vetriculostomy   Patient Active Problem List   Diagnosis Date Noted   Status post total left knee replacement 05/26/2024   Acute pain of right knee 05/23/2023   Murmur, cardiac 01/24/2023   Primary osteoarthritis of left knee 05/19/2022   Lumbar  radiculopathy 03/11/2018   Ganglion cyst of right foot 07/04/2017   Chronic tension-type headache, not intractable 12/30/2015   Post-operative state 12/15/2015   Simple partial seizure disorder (HCC) 11/04/2015   Left sided numbness 09/25/2015   Leukopenia 09/25/2015   Tension vascular headache 09/07/2015   Meralgia paresthetica of left side 09/07/2015   Fainting spell 09/07/2015   Prediabetes 09/02/2015   Stye 08/06/2014   Essential hypertension, benign 04/16/2014   CVA (cerebral vascular accident) (HCC) 04/16/2014   Headache 04/16/2014   Tension headache 03/06/2014   Obesity, morbid (HCC) 08/13/2013   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   ICH (intracerebral hemorrhage) (HCC) 07/21/2013   Hypokalemia 07/21/2013   Intracerebral hemorrhage (HCC) 07/06/2013   Essential hypertension 02/24/2009    PCP: Delbert Clam MD  REFERRING PROVIDER: Jule Ronal CROME PA-C  REFERRING DIAG: [phone] s/p total left knee replacement  THERAPY DIAG:  Left knee pain; weakness; left knee stiffness  Rationale for Evaluation and Treatment: Rehabilitation  ONSET DATE: > 1 year; surgery 7/28  SUBJECTIVE:   SUBJECTIVE STATEMENT: 05/26/24 left TKR; presents without assistive device; states she had 2 weeks of HHPT; reports she was supposed to start outpatient PT 3 weeks ago; Left knee not really hurting but right hip pain since surgery   PERTINENT HISTORY: Has a leaky valve and having a cardiac procedure to have a better view  Oct 7; doing OT for hand tremors since increased since surgery History of CVA, hemorrhagic brain infarction; HTN PAIN:   Are you having pain? Yes NPRS scale: 0/10 Pain location: Left knee not hurting now but having right hip pain since surgery Pain orientation: Right and Left  PAIN TYPE: aching Pain description: intermittent  Aggravating factors: certain movements like getting in/out of the car Relieving factors: ice  PRECAUTIONS: None    WEIGHT  BEARING RESTRICTIONS: No  FALLS:  Has patient fallen in last 6 months? No  LIVING ENVIRONMENT: Lives in: House/apartment Stairs: Yes: External: 15 steps; on right going up doing one step at a time   OCCUPATION: retired   PLOF: Independent  PATIENT GOALS: knee will be moving as it shoulder normally so not relying on right leg; turning over in bed easier while sleeping  NEXT MD VISIT: Oct 7th but having heart procedure  OBJECTIVE:  Note: Objective measures were completed at Evaluation unless otherwise noted.  DIAGNOSTIC FINDINGS: left knee OA  PATIENT SURVEYS:  LEFS  Extreme difficulty/unable (0), Quite a bit of difficulty (1), Moderate difficulty (2), Little difficulty (3), No difficulty (4) Survey date:    9/18  Any of your usual work, housework or school activities 2  2. Usual hobbies, recreational or sporting activities 2  3. Getting into/out of the bath 2  4. Walking between rooms 4  5. Putting on socks/shoes 2  6. Squatting  2  7. Lifting an object, like a bag of groceries from the floor 4  8. Performing light activities around your home 4  9. Performing heavy activities around your home 3  10. Getting into/out of a car 2  11. Walking 2 blocks 3  12. Walking 1 mile 1  13. Going up/down 10 stairs (1 flight) 2  14. Standing for 1 hour 3  15.  sitting for 1 hour 3  16. Running on even ground 1  17. Running on uneven ground 1  18. Making sharp turns while running fast 1  19. Hopping  1  20. Rolling over in bed 2  Score total:  45/80     COGNITION: Overall cognitive status: Within functional limits for tasks assessed     EDEMA:  Moderate peri-patellar edema  PALPATION: Decreased scar mobility; decreased patellar mobility  LOWER EXTREMITY ROM:  Active ROM Right eval Left eval  Hip flexion full full  Hip extension    Hip abduction    Hip adduction    Hip internal rotation    Hip external rotation    Knee flexion 120 93 sit/95 supine   Knee extension  0 7 sit and supine   Ankle dorsiflexion    Ankle plantarflexion    Ankle inversion    Ankle eversion     (Blank rows = not tested)  LOWER EXTREMITY MMT: + quad lag with SLR  MMT Right eval Left eval  Hip flexion 4 4  Hip extension    Hip abduction 4 4  Hip adduction    Hip internal rotation    Hip external rotation    Knee flexion 5 4-  Knee extension 5 3+  Ankle dorsiflexion    Ankle plantarflexion 4 4  Ankle inversion    Ankle eversion     (Blank rows = not tested)   FUNCTIONAL TESTS:  Moderate use of bil Ues to rise from a standard chair 3 minute walk test: 383 feet no device  GAIT:  Comments: no device but decreased stance  time on left; lacks full knee extension; decreased speed; difficulty walking with her purse on one side                                                                                                                                TREATMENT DATE: 07/17/24 evaluation  Initial HEP as below   PATIENT EDUCATION:  Education details: Educated patient on anatomy and physiology of current symptoms, prognosis, plan of care as well as initial self care strategies to promote recovery Person educated: Patient Education method: Explanation Education comprehension: verbalized understanding  HOME EXERCISE PROGRAM: Access Code: [email or web address] URL: [email or web address] Date: 07/17/2024 Prepared by: Glade Pesa  Exercises - Supine Heel Slide with Strap  - 5 x daily - 7 x weekly - 1 sets - 10 reps - Supine Knee Extension Strengthening  - 1 x daily - 7 x weekly - 1 sets - 10 reps - Active Straight Leg Raise with Quad Set (Mirrored)  - 1 x daily - 7 x weekly - 2 sets - 10 reps - Seated Knee Flexion Stretch (Mirrored)  - 5 x daily - 7 x weekly - 1 sets - 10 reps - Standing Knee Flexion Stretch on Step  - 5 x daily - 7 x weekly - 1 sets - 10 reps  ASSESSMENT:  CLINICAL IMPRESSION: Patient is a 63 y.o. female who was seen today for physical  therapy evaluation and treatment for s/p left total knee replacement 05/26/24. The patient had HHPT for 2 weeks and after discharge she did some of the exercises at home.  She is ambulating without an assistive device but at a slower speed and gait abnormalities including lack of terminal knee extension. She has a flight of steps to enter her home but uses a one step at a time pattern. The patient demonstrates decreased knee flexion and extension range of motion (7-95 degrees) as well as patellar hypmobility in all directions.  Strength deficits and asymmetry with opposite knee include decreased quad strength.  These deficits and pain cause functional impairments with standing, walking, going up and down curbs and steps at home and in the community.      OBJECTIVE IMPAIRMENTS: decreased activity tolerance, decreased mobility, difficulty walking, decreased ROM, decreased strength, increased edema, impaired perceived functional ability, and pain.   ACTIVITY LIMITATIONS: standing, squatting, sleeping, stairs, bed mobility, and locomotion level  PARTICIPATION LIMITATIONS: meal prep, cleaning, laundry, driving, shopping, community activity, and church  PERSONAL FACTORS: 1-2 comorbidities: HTN, prior CVA are also affecting patient's functional outcome.   REHAB POTENTIAL: Good  CLINICAL DECISION MAKING: Stable/uncomplicated  EVALUATION COMPLEXITY: Low   GOALS: Goals reviewed with patient? Yes  SHORT TERM GOALS: Target date: 08/14/2024   The patient will demonstrate knowledge of basic self care strategies and exercises to promote healing  Baseline: Goal status: INITIAL  2.   Improved knee ROM 5-105 for greater ease getting in/out of the car Baseline:  Goal status:  INITIAL  3.  Able to rise from a standard chair with min use of UEs Baseline:  Goal status: INITIAL  4.  Improved gait speed with patient able to walk 850 feet in 6 minutes Baseline:  Goal status: INITIAL     LONG TERM  GOALS: Target date: 09/11/2024   The patient will be independent in a safe self progression of a home exercise program to promote further recovery of function  Baseline:  Goal status: INITIAL  2.  The patient will report a 75% improvement in pain levels with functional activities which are currently difficult including going to church, shopping, turning over in bed Baseline:  Goal status: INITIAL  3.  The patient will have improved LE strength of at least 4+/5 needed to ascend and descend steps reciprocally  Baseline:  Goal status: INITIAL  4.  Improved ROM 3-115 degrees needed for stairs, getting in/out of the car and for a normalized gait pattern Baseline:  Goal status: INITIAL  5.  Lower Extremity Functional Scale (LEFS) improved to    55   /80 indicating improved function with less pain Baseline:  Goal status: INITIAL    PLAN:  PT FREQUENCY: 2x/week  PT DURATION: 8 weeks  PLANNED INTERVENTIONS: 97164- PT Re-evaluation, 97110-Therapeutic exercises, 97530- Therapeutic activity, 97112- Neuromuscular re-education, 97535- Self Care, [id number]- Manual therapy, [phone]- Aquatic Therapy, [id number]- Electrical stimulation (unattended), [phone]- Electrical stimulation (manual), 97016- Vasopneumatic device, Patient/Family education, Balance training, Taping, Joint mobilization, Scar mobilization, Cryotherapy, and Moist heat  PLAN FOR NEXT SESSION: Nu-step or rocking on the bike; knee flexion and extension ROM; quad strengthening; manual therapy including scar massage and patellar mobilization; vasocompression as needed for edema control; progress HEP  Glade Pesa, PT 07/17/24 10:56 PM Phone: [phone] Fax: [phone]

## 2024-07-18 ENCOUNTER — Ambulatory Visit: Admitting: Nurse Practitioner

## 2024-07-22 ENCOUNTER — Ambulatory Visit: Admitting: Occupational Therapy

## 2024-07-22 NOTE — Therapy (Incomplete)
 OUTPATIENT OCCUPATIONAL THERAPY NEURO EVALUATION  Patient Name: Alison Ward MRN: [id number] DOB:1961-01-28, 63 y.o., female Today's Date: 07/22/2024  PCP: Delbert Clam, MD REFERRING PROVIDER: Delbert Clam, MD  END OF SESSION:    Past Medical History:  Diagnosis Date   Arthritis    Chronic headaches    daily, bitemporal, associated with tingling in face   Heart murmur    Hypertension    Mitral regurgitation    Pre-diabetes    Seizures (HCC)    partial seizures last one on 10/22/15  On Keppra  currently - last seizure 2021   Stroke (HCC) 07/05/2013   Tremor    in hands, more in right   Past Surgical History:  Procedure Laterality Date   ABDOMINAL HYSTERECTOMY N/A 12/15/2015   Procedure: HYSTERECTOMY ABDOMINAL;  Surgeon: Harland JAYSON Birkenhead, MD;  Location: [facility];  Service: Gynecology;  Laterality: N/A;   CESAREAN SECTION     COLONOSCOPY     POLYPECTOMY     SALPINGOOPHORECTOMY Bilateral 12/15/2015   Procedure: SALPINGO OOPHORECTOMY;  Surgeon: Harland JAYSON Birkenhead, MD;  Location: [facility];  Service: Gynecology;  Laterality: Bilateral;   TOTAL KNEE ARTHROPLASTY Left 05/26/2024   Procedure: LEFT TOTAL KNEE ARTHROPLASTY;  Surgeon: Jerri Kay HERO, MD;  Location: [facility];  Service: Orthopedics;  Laterality: Left;   VENTRICULOSTOMY Left 07/07/2013   Procedure: VENTRICULOSTOMY;  Surgeon: Reyes JONETTA Budge, MD;  Location: [facility];  Service: Neurosurgery;  Laterality: Left;  Left Ventriculostomy and Removal of Right Vetriculostomy   Patient Active Problem List   Diagnosis Date Noted   Status post total left knee replacement 05/26/2024   Acute pain of right knee 05/23/2023   Murmur, cardiac 01/24/2023   Primary osteoarthritis of left knee 05/19/2022   Lumbar radiculopathy 03/11/2018   Ganglion cyst of right foot 07/04/2017   Chronic tension-type headache, not intractable 12/30/2015   Post-operative state 12/15/2015   Simple partial seizure disorder (HCC) 11/04/2015   Left sided  numbness 09/25/2015   Leukopenia 09/25/2015   Tension vascular headache 09/07/2015   Meralgia paresthetica of left side 09/07/2015   Fainting spell 09/07/2015   Prediabetes 09/02/2015   Stye 08/06/2014   Essential hypertension, benign 04/16/2014   CVA (cerebral vascular accident) (HCC) 04/16/2014   Headache 04/16/2014   Tension headache 03/06/2014   Obesity, morbid (HCC) 08/13/2013   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  Rationale for Evaluation and Treatment: Rehabilitation  SUBJECTIVE:   SUBJECTIVE STATEMENT: Pt reports tremors in B hand that started after knee surgery.  Pt reports difficulty with donning earrings, handwriting is hit or miss based on fatigue.  Pt reports difficulty with bringing cup to mouth, now using straws.  Pt reports having to modify what she eats as she is eating more hand held foods due to tremors.  Of note, pt reports that she did have tremors prior to knee surgery about 1.5-2 years ago but that things had improved, however increase in tremors since knee surgery.  Per chart review She experiences significant tremors and fatigue since her knee replacement surgery. The tremors interfere with daily activities, including eating, drinking, and dressing, and require assistance for tasks like using the microwave. Tremors worsen with fatigue and improve with rest. Pt accompanied by:  self (friend brought pt to appt)  PERTINENT HISTORY: history of hypertension, chronic headaches, previous history of right  Hemorrhagic stroke status post ventriculostomy in 06/2013, Prediabetes, meralgia paresthetica, hyperlipidemia; post- surgical tremors s/p knee surgery  PRECAUTIONS:  None  WEIGHT BEARING RESTRICTIONS: No  PAIN:  Are you having pain? No  FALLS: Has patient fallen in last 6 months? No  LIVING ENVIRONMENT: Lives with: lives with their family (adult dtr and 2 granddaughters) Lives in: House/apartment Stairs: 2 floor apt, full flight of steps with rails on B sides Has following equipment at home: Walker - 2 wheeled - not using it anymore  PLOF: Independent and Independent with basic ADLs  PATIENT GOALS: to learn strategies to increase ease and control of hand movements  OBJECTIVE:  Note: Objective measures were completed at Evaluation unless otherwise noted.  HAND DOMINANCE: Right  ADLs: Transfers/ambulation related to ADLs: initially using RW after knee surgery, no longer using Eating: difficulty with bringing cup to mouth (spilling), therefore using straw in cups and using a lot more hand held foods to decrease spillage UB Dressing: reports needing assistance to fasten buttons LB Dressing: Independent Toileting:  Independent Bathing:  Independent Tub Shower transfers:  Independent Equipment: none  IADLs: Light housekeeping: folding laundry, makes the bed and changes her sheets but it takes more effort than prior to surgery Meal Prep: daughter has been doing more of the cooking since pt had surgery, pt reports that she plans to try cooking today to see how it goes Community mobility: pt no longer drives as she reports having h/o brain surgery and seizures therefore no longer drives, does use Medicaid transportation and/or family/friends Medication management: reports that she recently started using pill box for recall of meds Handwriting: 30.53 seconds [place];  Moderate changes in quality of letter formation due to tremors  MOBILITY STATUS: slower due to recent knee surgery  POSTURE COMMENTS:  No Significant postural limitations  ACTIVITY TOLERANCE: Activity tolerance: diminished, reports fatiguing more quickly  than prior to surgery  FUNCTIONAL OUTCOME MEASURES: Physical performance test: Self-feeding: 44.47 sec, tremors increased with activity, dropping 1 bean* 3 button/unbutton: 46.90 sec  UPPER EXTREMITY ROM:    Active ROM Right eval Left eval  Shoulder flexion [facility]  Shoulder abduction [facility]  Shoulder adduction    Shoulder extension    Shoulder internal rotation [facility]  Shoulder external rotation [facility] WFL  Elbow flexion WFL WFL  Elbow extension [facility] WFL  Wrist flexion    Wrist extension    Wrist ulnar deviation    Wrist radial deviation    Wrist pronation    Wrist supination    (Blank rows = not tested)  UPPER EXTREMITY MMT:     MMT Right eval Left eval  Shoulder flexion 4 4  Shoulder abduction    Shoulder adduction    Shoulder extension    Shoulder internal rotation    Shoulder external rotation    Middle trapezius    Lower trapezius    Elbow flexion 4- (increased tremor) 4- (increased tremor)  Elbow extension 4- (increased tremor) 4- (increased tremor)  Wrist flexion    Wrist extension    Wrist ulnar deviation    Wrist radial deviation    Wrist pronation    Wrist supination    (Blank rows = not tested)  HAND FUNCTION: TBD  COORDINATION: 9 Hole Peg test: Right: 46.19 sec; Left: 63.56 sec Box and Blocks:  TBD Tremors: action, Right, Left,  and increased tremors L > R with peg test  SENSATION: WFL  COGNITION: Overall cognitive status: Within functional limits for tasks assessed  VISION: Subjective report: no changes Baseline vision: Wears glasses all the time   OBSERVATIONS: Pt demonstrating tremors at rest and with activity, noting increased tremors L > R in hand and forearm with fine motor tasks.                                                                                                                             TREATMENT DATE:  07/22/24 Tremor handouts - educate on proximity of arms to body pour cup of water, carry items, WB, initiate  coordination and/or theraband HEP   07/03/24 Educated on strategies to reduce action tremor, provided handout, and educated on recommendation to reflect on fatigue and stress and how they impact her tremors, provided with handout.        PATIENT EDUCATION: Education details: Educated on role and purpose of OT as well as potential interventions and goals for therapy based on initial evaluation findings. Person educated: Patient Education method: Explanation and Handouts Education comprehension: verbalized understanding and needs further education  HOME EXERCISE PROGRAM: TBD   GOALS: Goals reviewed with patient? Yes  SHORT TERM GOALS: Target date: 07/24/24  Pt will be independent in coordination and strengthening HEP. Baseline: new to OPOT Goal status: INITIAL  2.  Pt will verbalize understanding of task modifications and/or adaptive strategies to compensate for tremors during ADLs and fine motor control tasks.  Baseline: increased tremors with putting in earrings, handwriting, eating, pouring items, retrieving items from microwave, and buttoning Goal status: INITIAL  3.  Pt will verbalize understanding of energy conservation strategies.  Baseline: increased fatigue s/p surgery  Baseline: INITIAL   LONG TERM GOALS: Target date: 08/14/24  Pt will demonstrate improved ease with fastening buttons as evidenced by decreasing 3 button/ unbutton time to 40 seconds or less with use of AE and/or adaptive strategies. Baseline: 46.90 sec Goal status: INITIAL  2.  Pt will demonstrate improved fine motor coordination for ADLs as evidenced by decreasing 9 hole peg test score for LUE by 12 seconds and RUE by 8 seconds. Baseline: 46.19 sec; Left: 63.56 sec Goal status: INITIAL  3.  Pt will demonstrate improved ease with feeding as evidenced by decreasing PPT#2 (self feeding) by 8 secs Baseline: 44.47 Goal status: INITIAL  4.  Pt will demonstrate improved legibility and ease with  handwriting as evidenced by decreasing PPT #1 (handwriting) by 5 seconds. Baseline: 30.53 seconds Goal status: INITIAL  ASSESSMENT:  CLINICAL IMPRESSION: Patient is a 63 y.o. female who was seen today for occupational therapy evaluation for onset of tremors in BUE s/p knee surgery.  Of note, pt does report that she has dealt with tremors prior to knee surgery but that they are significantly pronounced s/p surgery.  Pt reporting increased difficulty with self-feeding, retrieving items from microwave, handwriting, putting in earrings, managing buttons due  to tremors.  Pt lives with adult daughter and granddaughters, daughter will occasionally assist with her hair and buttons on shirt as needed.  Pt will benefit from skilled occupational therapy services to address strength and coordination,GM/FM control, introduction of compensatory strategies/AE prn, and implementation of an HEP to improve participation and safety during ADLs and IADLs.    PERFORMANCE DEFICITS: in functional skills including ADLs, IADLs, coordination, strength, Fine motor control, Gross motor control, endurance, decreased knowledge of use of DME, and UE functional use and psychosocial skills including environmental adaptation and routines and behaviors.     PLAN:  OT FREQUENCY: 1-2x/week  OT DURATION: 6 weeks  PLANNED INTERVENTIONS: 97168 OT Re-evaluation, 97535 self care/ADL training, 02889 therapeutic exercise, 97530 therapeutic activity, 97112 neuromuscular re-education, functional mobility training, psychosocial skills training, energy conservation, coping strategies training, patient/family education, and DME and/or AE instructions  RECOMMENDED OTHER SERVICES: NA  CONSULTED AND AGREED WITH PLAN OF CARE: Patient  PLAN FOR NEXT SESSION: pour cup of water, carry items, educate on proximity of arms to body, WB, initiate coordination and/or theraband HEP   Fitz Matsuo, OTR/L 07/22/2024, 7:59 AM  [facility] at [facility] [address] [place], [place] [address] Phone # [phone] Fax # [phone]

## 2024-07-28 ENCOUNTER — Ambulatory Visit: Admitting: Rehabilitative and Restorative Service Providers"

## 2024-07-28 ENCOUNTER — Encounter: Payer: Self-pay | Admitting: Rehabilitative and Restorative Service Providers"

## 2024-07-28 DIAGNOSIS — R262 Difficulty in walking, not elsewhere classified: Secondary | ICD-10-CM

## 2024-07-28 DIAGNOSIS — M25562 Pain in left knee: Secondary | ICD-10-CM

## 2024-07-28 DIAGNOSIS — M6281 Muscle weakness (generalized): Secondary | ICD-10-CM

## 2024-07-28 DIAGNOSIS — M25662 Stiffness of left knee, not elsewhere classified: Secondary | ICD-10-CM | POA: Diagnosis not present

## 2024-07-28 NOTE — Therapy (Signed)
 OUTPATIENT PHYSICAL THERAPY TREATMENT NOTE   Patient Name: Alison Ward MRN: [id number] DOB:September 17, 1961, 63 y.o., female Today's Date: 07/28/2024  END OF SESSION:  PT End of Session - 07/28/24 1231     Visit Number 2    Date for Recertification  09/11/24    Authorization Type Medicare/ Medicaid    PT Start Time 1228    PT Stop Time 1310    PT Time Calculation (min) 42 min    Activity Tolerance Patient tolerated treatment well    Behavior During Therapy [facility] for tasks assessed/performed          Past Medical History:  Diagnosis Date   Arthritis    Chronic headaches    daily, bitemporal, associated with tingling in face   Heart murmur    Hypertension    Mitral regurgitation    Pre-diabetes    Seizures (HCC)    partial seizures last one on 10/22/15  On Keppra  currently - last seizure 2021   Stroke (HCC) 07/05/2013   Tremor    in hands, more in right   Past Surgical History:  Procedure Laterality Date   ABDOMINAL HYSTERECTOMY N/A 12/15/2015   Procedure: HYSTERECTOMY ABDOMINAL;  Surgeon: Harland JAYSON Birkenhead, MD;  Location: [facility];  Service: Gynecology;  Laterality: N/A;   CESAREAN SECTION     COLONOSCOPY     POLYPECTOMY     SALPINGOOPHORECTOMY Bilateral 12/15/2015   Procedure: SALPINGO OOPHORECTOMY;  Surgeon: Harland JAYSON Birkenhead, MD;  Location: [facility];  Service: Gynecology;  Laterality: Bilateral;   TOTAL KNEE ARTHROPLASTY Left 05/26/2024   Procedure: LEFT TOTAL KNEE ARTHROPLASTY;  Surgeon: Jerri Kay HERO, MD;  Location: [facility];  Service: Orthopedics;  Laterality: Left;   VENTRICULOSTOMY Left 07/07/2013   Procedure: VENTRICULOSTOMY;  Surgeon: Reyes JONETTA Budge, MD;  Location: [facility];  Service: Neurosurgery;  Laterality: Left;  Left Ventriculostomy and Removal of Right Vetriculostomy   Patient Active Problem List   Diagnosis Date Noted   Status post total left knee replacement 05/26/2024   Acute pain of right knee 05/23/2023   Murmur, cardiac 01/24/2023   Primary  osteoarthritis of left knee 05/19/2022   Lumbar radiculopathy 03/11/2018   Ganglion cyst of right foot 07/04/2017   Chronic tension-type headache, not intractable 12/30/2015   Post-operative state 12/15/2015   Simple partial seizure disorder (HCC) 11/04/2015   Left sided numbness 09/25/2015   Leukopenia 09/25/2015   Tension vascular headache 09/07/2015   Meralgia paresthetica of left side 09/07/2015   Fainting spell 09/07/2015   Prediabetes 09/02/2015   Stye 08/06/2014   Essential hypertension, benign 04/16/2014   CVA (cerebral vascular accident) (HCC) 04/16/2014   Headache 04/16/2014   Tension headache 03/06/2014   Obesity, morbid (HCC) 08/13/2013   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  Denies any pain today.  States that her ADLs are getting easier.   PERTINENT HISTORY: 05/26/24 left TKR Has a leaky valve and having a cardiac procedure to have a better view Oct 7; doing  OT for hand tremors since increased since surgery History of CVA, hemorrhagic brain infarction; HTN  PAIN:   Are you having pain? Yes NPRS scale: 0/10 Pain location: Left knee not hurting now but having right hip pain since surgery Pain orientation: Right and Left  PAIN TYPE: aching Pain description: intermittent  Aggravating factors: certain movements like getting in/out of the car Relieving factors: ice   PRECAUTIONS: None    WEIGHT BEARING RESTRICTIONS:  No  FALLS:  Has patient fallen in last 6 months? No  LIVING ENVIRONMENT: Lives in: House/apartment Stairs: Yes: External: 15 steps; on right going up doing one step at a time   OCCUPATION: retired   PLOF: Independent  PATIENT GOALS: knee will be moving as it shoulder normally so not relying on right leg; turning over in bed easier while sleeping  NEXT MD VISIT: Oct 7th (having heart procedure), Post-Op on 08/12/24  OBJECTIVE:  Note: Objective measures were completed at Evaluation unless otherwise noted.  DIAGNOSTIC FINDINGS: left knee OA  PATIENT SURVEYS:  LEFS  Extreme difficulty/unable (0), Quite a bit of difficulty (1), Moderate difficulty (2), Little difficulty (3), No difficulty (4) Survey date:    9/18  Any of your usual work, housework or school activities 2  2. Usual hobbies, recreational or sporting activities 2  3. Getting into/out of the bath 2  4. Walking between rooms 4  5. Putting on socks/shoes 2  6. Squatting  2  7. Lifting an object, like a bag of groceries from the floor 4  8. Performing light activities around your home 4  9. Performing heavy activities around your home 3  10. Getting into/out of a car 2  11. Walking 2 blocks 3  12. Walking 1 mile 1  13. Going up/down 10 stairs (1 flight) 2  14. Standing for 1 hour 3  15.  sitting for 1 hour 3  16. Running on even ground 1  17. Running on uneven ground 1  18. Making sharp turns while running fast 1  19. Hopping  1  20. Rolling over in bed 2  Score total:  45/80     COGNITION: Overall cognitive status: Within functional limits for tasks assessed     EDEMA:  Moderate peri-patellar edema  PALPATION: Decreased scar mobility; decreased patellar mobility  LOWER EXTREMITY ROM:  Active ROM Right eval Left eval  Hip flexion full full  Hip extension    Hip abduction    Hip adduction    Hip internal rotation    Hip external rotation    Knee flexion 120 93 sit/95 supine   Knee extension 0  7 sit and supine   Ankle dorsiflexion    Ankle plantarflexion    Ankle inversion    Ankle eversion     (Blank rows = not tested)  LOWER EXTREMITY MMT: + quad lag with SLR  MMT Right eval Left eval  Hip flexion 4 4  Hip extension    Hip abduction 4 4  Hip adduction    Hip internal rotation    Hip external rotation    Knee flexion 5 4-  Knee extension 5 3+  Ankle dorsiflexion    Ankle plantarflexion 4 4  Ankle inversion    Ankle eversion     (Blank rows = not tested)   FUNCTIONAL TESTS:  Eval: Moderate use of bil Ues to rise from a standard chair 3 minute walk test: 383 feet no device  07/28/2024: Timed up and Go (TUG):  15.02 sec 5 times sit to/from stand:  19.76 sec with decreased weight through LLE  GAIT:  Comments: no device but decreased stance time on left; lacks full knee extension; decreased speed; difficulty walking with her purse on one side                                                                                                                                TREATMENT DATE:   07/28/2024: Nustep level 4 x7 min with PT present to discuss status TUG and 5 times sit to/from stand Seated long arc quad 2x10 left LE (with 2 sec hold) Seated heel slide with left foot on slider 2x10 Standing at barre:  marching, hamstring curl, and hip abduction.  2x10 each bilat Supine straight leg raise 2x10 left LE (with cuing for slow pacing) Supine bridge 2x10 Seated quad set 2x10 with left LE   07/17/24 evaluation  Initial HEP as below   PATIENT EDUCATION:  Education details: Educated patient on anatomy and physiology of current symptoms, prognosis, plan of care as well as initial self care strategies to promote recovery Person educated: Patient Education method: Explanation Education comprehension: verbalized understanding  HOME EXERCISE PROGRAM: Access Code: [email or web address] URL: [email or web address] Date: 07/17/2024 Prepared by: Glade Pesa  Exercises - Supine Heel Slide with Strap  - 5 x daily - 7 x weekly - 1 sets - 10 reps - Supine Knee Extension Strengthening  - 1 x daily - 7 x weekly - 1 sets - 10 reps - Active Straight Leg Raise with Quad Set (Mirrored)  - 1 x daily - 7 x weekly - 2 sets - 10 reps - Seated Knee Flexion Stretch (Mirrored)  - 5 x daily - 7 x weekly - 1 sets - 10 reps - Standing Knee Flexion Stretch on Step  - 5 x daily - 7 x weekly - 1 sets - 10 reps  ASSESSMENT:  CLINICAL IMPRESSION: Ms Echevarria presents to skilled PT reporting that she has been doing her exercises.  Patient with great participation throughout and able to perform exercises with only minimal cuing.  Patient continues to deny any pain during PT session.  Patient is progressing with left knee ROM and strengthening.  Patient able to perform 5 times sit to/from stand and TUG to establish a baseline measurement.  Patient has planned transesophageal echocardiogram on October 7th.  Provided patient with education on the importance on continuing to perform ROM/supine exercises when she is recovering from her procedure to prevent her left knee from stiffening up, she verbalizes understanding.  Patient continues to require skilled PT to progress towards goal related activities.    OBJECTIVE IMPAIRMENTS: decreased activity tolerance, decreased mobility, difficulty walking, decreased ROM, decreased strength, increased edema, impaired perceived functional ability, and pain.   ACTIVITY LIMITATIONS: standing, squatting, sleeping, stairs, bed mobility, and locomotion level  PARTICIPATION LIMITATIONS: meal prep, cleaning, laundry, driving, shopping, community activity, and church  PERSONAL FACTORS:  1-2 comorbidities: HTN, prior CVA are also affecting patient's functional outcome.   REHAB POTENTIAL: Good  CLINICAL DECISION MAKING: Stable/uncomplicated  EVALUATION COMPLEXITY: Low   GOALS: Goals reviewed with patient? Yes  SHORT TERM GOALS:  Target date: 08/14/2024   The patient will demonstrate knowledge of basic self care strategies and exercises to promote healing  Baseline: Goal status: Ongoing  2.   Improved knee ROM 5-105 for greater ease getting in/out of the car Baseline:  Goal status: INITIAL  3.  Able to rise from a standard chair with min use of UEs Baseline:  Goal status: Ongoing  4.  Improved gait speed with patient able to walk 850 feet in 6 minutes Baseline:  Goal status: INITIAL     LONG TERM GOALS: Target date: 09/11/2024   The patient will be independent in a safe self progression of a home exercise program to promote further recovery of function  Baseline:  Goal status: INITIAL  2.  The patient will report a 75% improvement in pain levels with functional activities which are currently difficult including going to church, shopping, turning over in bed Baseline:  Goal status: INITIAL  3.  The patient will have improved LE strength of at least 4+/5 needed to ascend and descend steps reciprocally  Baseline:  Goal status: INITIAL  4.  Improved ROM 3-115 degrees needed for stairs, getting in/out of the car and for a normalized gait pattern Baseline:  Goal status: INITIAL  5.  Lower Extremity Functional Scale (LEFS) improved to    55   /80 indicating improved function with less pain Baseline:  Goal status: INITIAL    PLAN:  PT FREQUENCY: 2x/week  PT DURATION: 8 weeks  PLANNED INTERVENTIONS: 97164- PT Re-evaluation, 97110-Therapeutic exercises, 97530- Therapeutic activity, 97112- Neuromuscular re-education, 97535- Self Care, 02859- Manual therapy, [phone]- Aquatic Therapy, [id number]- Electrical stimulation (unattended), [phone]- Electrical stimulation (manual), 97016- Vasopneumatic device, Patient/Family education, Balance training, Taping, Joint mobilization, Scar mobilization, Cryotherapy, and Moist heat  PLAN FOR NEXT SESSION: Nu-step or rocking on the bike; knee flexion and extension ROM;  quad strengthening; manual therapy including scar massage and patellar mobilization; vasocompression as needed for edema control; progress HEP, measure left knee A/ROM    Jarrell Laming, PT, DPT 07/28/24, 1:23 PM  [facility] [address], [address] [place], [place] [address] Phone # [phone] Fax [phone]

## 2024-07-30 ENCOUNTER — Encounter: Admitting: Physical Therapy

## 2024-07-30 ENCOUNTER — Telehealth: Payer: Self-pay | Admitting: Family Medicine

## 2024-07-30 NOTE — Telephone Encounter (Signed)
 pt confirmed appt (per vr

## 2024-07-31 ENCOUNTER — Encounter: Payer: Self-pay | Admitting: Family Medicine

## 2024-07-31 ENCOUNTER — Ambulatory Visit: Attending: Family Medicine | Admitting: Family Medicine

## 2024-07-31 VITALS — BP 118/69 | HR 62 | Ht 68.0 in | Wt 233.0 lb

## 2024-07-31 DIAGNOSIS — G5710 Meralgia paresthetica, unspecified lower limb: Secondary | ICD-10-CM | POA: Insufficient documentation

## 2024-07-31 DIAGNOSIS — R7303 Prediabetes: Secondary | ICD-10-CM | POA: Insufficient documentation

## 2024-07-31 DIAGNOSIS — G5712 Meralgia paresthetica, left lower limb: Secondary | ICD-10-CM

## 2024-07-31 DIAGNOSIS — Z8673 Personal history of transient ischemic attack (TIA), and cerebral infarction without residual deficits: Secondary | ICD-10-CM | POA: Insufficient documentation

## 2024-07-31 DIAGNOSIS — G252 Other specified forms of tremor: Secondary | ICD-10-CM | POA: Diagnosis not present

## 2024-07-31 DIAGNOSIS — Z471 Aftercare following joint replacement surgery: Secondary | ICD-10-CM | POA: Diagnosis present

## 2024-07-31 DIAGNOSIS — Z823 Family history of stroke: Secondary | ICD-10-CM | POA: Diagnosis not present

## 2024-07-31 DIAGNOSIS — Z8249 Family history of ischemic heart disease and other diseases of the circulatory system: Secondary | ICD-10-CM | POA: Diagnosis not present

## 2024-07-31 DIAGNOSIS — I10 Essential (primary) hypertension: Secondary | ICD-10-CM | POA: Diagnosis not present

## 2024-07-31 DIAGNOSIS — Z96652 Presence of left artificial knee joint: Secondary | ICD-10-CM

## 2024-07-31 DIAGNOSIS — E7800 Pure hypercholesterolemia, unspecified: Secondary | ICD-10-CM | POA: Insufficient documentation

## 2024-07-31 DIAGNOSIS — Z96653 Presence of artificial knee joint, bilateral: Secondary | ICD-10-CM | POA: Diagnosis not present

## 2024-07-31 DIAGNOSIS — Z79899 Other long term (current) drug therapy: Secondary | ICD-10-CM | POA: Insufficient documentation

## 2024-07-31 DIAGNOSIS — R251 Tremor, unspecified: Secondary | ICD-10-CM

## 2024-07-31 LAB — POCT GLYCOSYLATED HEMOGLOBIN (HGB A1C): HbA1c, POC (prediabetic range): 5.7 % (ref 5.7–6.4)

## 2024-07-31 MED ORDER — GABAPENTIN 600 MG PO TABS: 600.0000 mg | ORAL_TABLET | Freq: Three times a day (TID) | ORAL | 0 refills | Status: DC

## 2024-07-31 MED ORDER — LISINOPRIL-HYDROCHLOROTHIAZIDE 20-25 MG PO TABS: 1.0000 | ORAL_TABLET | Freq: Every day | ORAL | 1 refills | Status: AC

## 2024-07-31 MED ORDER — ATORVASTATIN CALCIUM 40 MG PO TABS: 40.0000 mg | ORAL_TABLET | Freq: Every day | ORAL | 1 refills | Status: DC

## 2024-07-31 NOTE — Patient Instructions (Signed)
 Tremor A tremor is trembling or shaking that you cannot control. Most tremors affect the hands or arms. Tremors can also affect the head, vocal cords, face, and other parts of the body. There are many types of tremors. Common types include: Essential tremor. These usually occur in people older than 40. This type of tremor may run in families and can happen in otherwise healthy people. Resting tremor. These occur when the muscles are at rest, such as when your hands are resting in your lap. People with Parkinson's disease often have resting tremors. Postural tremor. These occur when you try to hold a pose, such as keeping your hands outstretched. Kinetic tremor. These occur during purposeful movement, such as trying to touch a finger to your nose. Task-specific tremor. These may occur when you do certain tasks such as writing, speaking, or standing. Psychogenic tremor. These are greatly reduced or go away when you are distracted. These tremors happen due to underlying stress or psychiatric disease. They can happen in people of all ages. Some types of tremors have no known cause. Tremors can also be a symptom of nervous system problems (neurological disorders) that may occur with aging. Some tremors go away with treatment, while others do not. Follow these instructions at home: Lifestyle     If you drink alcohol: Limit how much you have to: 0-1 drink a day for women who are not pregnant. 0-2 drinks a day for men. Know how much alcohol is in a drink. In the [place] one drink equals one 12 oz bottle of beer (355 mL), one 5 oz glass of wine (148 mL), or one 1 oz glass of hard liquor (44 mL). Do not use any products that contain nicotine or tobacco. These products include cigarettes, chewing tobacco, and vaping devices, such as e-cigarettes. If you need help quitting, ask your health care provider. Avoid extreme heat and extreme cold. Limit your caffeine intake, as told by your health care  provider. Try to get 8 hours of sleep each night. Find ways to manage your stress, such as meditation or yoga. General instructions Take over-the-counter and prescription medicines only as told by your health care provider. Keep all follow-up visits. This is important. Contact a health care provider if: You develop a tremor after starting a new medicine. You have a tremor along with other symptoms such as: Numbness. Tingling. Pain. Weakness. Your tremor gets worse. Your tremor interferes with your day-to-day life. Summary A tremor is trembling or shaking that you cannot control. Most tremors affect the hands or arms. Some types of tremors have no known cause. Others may be a symptom of nervous system problems (neurological disorders). Make sure you discuss any tremors you have with your health care provider. This information is not intended to replace advice given to you by your health care provider. Make sure you discuss any questions you have with your health care provider. Document Revised: 08/05/2021 Document Reviewed: 08/05/2021 Elsevier Patient Education  2024 [employer].

## 2024-07-31 NOTE — Progress Notes (Signed)
 Subjective:  Patient ID: Alison Ward, female    DOB: 1961/01/09  Age: 63 y.o. MRN: [id number]  CC: Medical Management of Chronic Issues     Discussed the use of AI scribe software for clinical note transcription with the patient, who gave verbal consent to proceed.  History of Present Illness Alison Ward is a 63 year old female with a history of hypertension, chronic headaches, previous history of right  Hemorrhagic stroke status post ventriculostomy in 06/2013, Prediabetes, meralgia paresthetica, hyperlipidemia, left knee replacement (04/2024) who presents for follow-up on her physical therapy and occupational therapy progress.  She no longer experiences left knee pain and swelling, which have improved, facilitating easier navigation of stairs. She attends physical therapy sessions, though transportation is challenging. Her daughter and church friends assist with transportation, and she finds Medicare services difficult, relying on Medicaid instead.  Hand tremors have shown some improvement with occupational therapy, which includes exercises like buttoning a shirt and transferring beans. Tremors worsen with fatigue, but she manages daily activities by pacing herself. She feels the therapy's focus on anxiety and mindfulness is not relevant to her condition.  Current medications include atorvastatin , gabapentin , lisinopril , hydrochlorothiazide , and low-dose aspirin . She has stopped using Voltaren  gel and muscle relaxants. Occasional right thigh pain occurs due to compensating for her knee, which has improved with therapy. She has an upcoming appointment for TEE next week.   Past Medical History:  Diagnosis Date   Arthritis    Chronic headaches    daily, bitemporal, associated with tingling in face   Heart murmur    Hypertension    Mitral regurgitation    Pre-diabetes    Seizures (HCC)    partial seizures last one on 10/22/15  On Keppra  currently - last seizure  2021   Stroke (HCC) 07/05/2013   Tremor    in hands, more in right    Past Surgical History:  Procedure Laterality Date   ABDOMINAL HYSTERECTOMY N/A 12/15/2015   Procedure: HYSTERECTOMY ABDOMINAL;  Surgeon: Harland JAYSON Birkenhead, MD;  Location: [facility];  Service: Gynecology;  Laterality: N/A;   CESAREAN SECTION     COLONOSCOPY     POLYPECTOMY     SALPINGOOPHORECTOMY Bilateral 12/15/2015   Procedure: SALPINGO OOPHORECTOMY;  Surgeon: Harland JAYSON Birkenhead, MD;  Location: [facility];  Service: Gynecology;  Laterality: Bilateral;   TOTAL KNEE ARTHROPLASTY Left 05/26/2024   Procedure: LEFT TOTAL KNEE ARTHROPLASTY;  Surgeon: Jerri Kay HERO, MD;  Location: [facility];  Service: Orthopedics;  Laterality: Left;   VENTRICULOSTOMY Left 07/07/2013   Procedure: VENTRICULOSTOMY;  Surgeon: Reyes JONETTA Budge, MD;  Location: [facility];  Service: Neurosurgery;  Laterality: Left;  Left Ventriculostomy and Removal of Right Vetriculostomy    Family History  Problem Relation Age of Onset   Hypertension Mother    Diabetes Mother    Stroke Mother    Hypertension Father    Colon cancer Neg Hx    Breast cancer Neg Hx    Colon polyps Neg Hx    Esophageal cancer Neg Hx    Rectal cancer Neg Hx    Stomach cancer Neg Hx     Social History   Socioeconomic History   Marital status: Divorced    Spouse name: Not on file   Number of children: 3   Years of education: college   Highest education level: Not on file  Occupational History   Occupation: [employer]  Tobacco Use   Smoking status: Never  Smokeless tobacco: Never  Vaping Use   Vaping status: Never Used  Substance and Sexual Activity   Alcohol use: No   Drug use: No   Sexual activity: Not Currently    Birth control/protection: Surgical  Other Topics Concern   Not on file  Social History Narrative   Not on file   Social Drivers of Health   Financial Resource Strain: Low Risk  (01/29/2024)   Overall Financial Resource Strain (CARDIA)    Difficulty of  Paying Living Expenses: Not very hard  Food Insecurity: No Food Insecurity (01/29/2024)   Hunger Vital Sign    Worried About Running Out of Food in the Last Year: Never true    Ran Out of Food in the Last Year: Never true  Transportation Needs: No Transportation Needs (01/29/2024)   PRAPARE - Administrator, Civil Service (Medical): No    Lack of Transportation (Non-Medical): No  Physical Activity: Insufficiently Active (01/29/2024)   Exercise Vital Sign    Days of Exercise per Week: 3 days    Minutes of Exercise per Session: 20 min  Stress: No Stress Concern Present (01/29/2024)   [employer] of Occupational Health - Occupational Stress Questionnaire    Feeling of Stress : Not at all  Social Connections: Moderately Integrated (01/29/2024)   Social Connection and Isolation Panel    Frequency of Communication with Friends and Family: Twice a week    Frequency of Social Gatherings with Friends and Family: Twice a week    Attends Religious Services: More than 4 times per year    Active Member of [employer] or Organizations: Yes    Attends Engineer, structural: More than 4 times per year    Marital Status: Never married    Allergies  Allergen Reactions   Elavil  [Amitriptyline ] Other (See Comments)    DIZZINESS   Topiramate  Er Other (See Comments)    Suicidal Thoughts- Trokendi     Outpatient Medications Prior to Visit  Medication Sig Dispense Refill   aspirin  EC 81 MG tablet Take 81 mg by mouth daily. Swallow whole.     levETIRAcetam  (KEPPRA  XR) 500 MG 24 hr tablet TAKE 1 TABLET BY MOUTH EVERYDAY AT BEDTIME 90 tablet 0   atorvastatin  (LIPITOR) 40 MG tablet Take 1 tablet (40 mg total) by mouth daily. 90 tablet 1   gabapentin  (NEURONTIN ) 600 MG tablet TAKE 1 TABLET BY MOUTH THREE TIMES A DAY 270 tablet 0   lisinopril -hydrochlorothiazide  (ZESTORETIC ) 20-25 MG tablet Take 1 tablet by mouth daily. 90 tablet 1   diclofenac  Sodium (VOLTAREN ) 1 % GEL Apply 4 g topically 4  (four) times daily. (Patient not taking: Reported on 07/31/2024) 100 g 1   docusate sodium  (COLACE) 100 MG capsule Take 1 capsule (100 mg total) by mouth daily as needed. (Patient not taking: Reported on 07/31/2024) 30 capsule 2   lidocaine  (LIDODERM ) 5 % Place 1 patch onto the skin daily. Remove & Discard patch within 12 hours or as directed by MD (Patient not taking: Reported on 07/31/2024) 90 patch 1   methocarbamol  (ROBAXIN ) 750 MG tablet Take 1 tablet (750 mg total) by mouth 3 (three) times daily as needed. (Patient not taking: Reported on 07/31/2024) 30 tablet 2   Misc. Devices MISC Raised toilet seat. Diagnosis- Osteoarthritis of the knees (Patient not taking: Reported on 07/31/2024) 1 each 0   ondansetron  (ZOFRAN ) 4 MG tablet Take 1 tablet (4 mg total) by mouth every 8 (eight) hours as needed for nausea  or vomiting. (Patient not taking: Reported on 07/31/2024) 40 tablet 0   oxyCODONE -acetaminophen  (PERCOCET) 5-325 MG tablet Take 1-2 tablets by mouth every 6 (six) hours as needed. To be taken after surgery (Patient not taking: Reported on 07/31/2024) 40 tablet 0   No facility-administered medications prior to visit.     ROS Review of Systems  Constitutional:  Negative for activity change and appetite change.  HENT:  Negative for sinus pressure and sore throat.   Respiratory:  Negative for chest tightness, shortness of breath and wheezing.   Cardiovascular:  Negative for chest pain and palpitations.  Gastrointestinal:  Negative for abdominal distention, abdominal pain and constipation.  Genitourinary: Negative.   Musculoskeletal: Negative.   Neurological:  Positive for tremors.  Psychiatric/Behavioral:  Negative for behavioral problems and dysphoric mood.     Objective:  BP 118/69   Pulse 62   Ht 5' 8 (1.727 m)   Wt 233 lb (105.7 kg)   LMP 11/24/2011   SpO2 99%   BMI 35.43 kg/m      07/31/2024   11:11 AM 07/15/2024    1:47 PM 06/26/2024   10:46 AM  BP/Weight  Systolic BP 118 118  109  Diastolic BP 69 78 74  Wt. (Lbs) 233 236 232  BMI 35.43 kg/m2 35.88 kg/m2 35.28 kg/m2      Physical Exam Constitutional:      Appearance: She is well-developed.  Cardiovascular:     Rate and Rhythm: Normal rate.     Heart sounds: Murmur heard.  Pulmonary:     Effort: Pulmonary effort is normal.     Breath sounds: Normal breath sounds. No wheezing or rales.  Chest:     Chest wall: No tenderness.  Abdominal:     General: Bowel sounds are normal. There is no distension.     Palpations: Abdomen is soft. There is no mass.     Tenderness: There is no abdominal tenderness.  Musculoskeletal:        General: Normal range of motion.     Right lower leg: No edema.     Left lower leg: No edema.  Neurological:     Mental Status: She is alert and oriented to person, place, and time.     Coordination: Coordination abnormal (tremor presentmore pronounced with activity).  Psychiatric:        Mood and Affect: Mood normal.        Latest Ref Rng & Units 07/15/2024    2:37 PM 05/19/2024   12:00 PM 01/29/2024   11:34 AM  CMP  Glucose 70 - 99 mg/dL 88  85  78   BUN 8 - 27 mg/dL 12  13  14    Creatinine 0.57 - 1.00 mg/dL 9.10  9.11  9.13   Sodium 134 - 144 mmol/L 140  140  142   Potassium 3.5 - 5.2 mmol/L 3.6  4.1  4.0   Chloride 96 - 106 mmol/L 99  102  100   CO2 20 - 29 mmol/L 28  30  29    Calcium  8.7 - 10.3 mg/dL 9.8  9.6  9.4   Total Protein 6.0 - 8.5 g/dL   7.2   Total Bilirubin 0.0 - 1.2 mg/dL   0.6   Alkaline Phos 44 - 121 IU/L   61   AST 0 - 40 IU/L   24   ALT 0 - 32 IU/L   15     Lipid Panel     Component Value Date/Time  CHOL 147 03/13/2023 0955   TRIG 131 03/13/2023 0955   HDL 43 03/13/2023 0955   CHOLHDL 3.7 01/20/2020 0841   CHOLHDL 6.4 (H) 12/06/2016 1012   VLDL 35 (H) 12/06/2016 1012   LDLCALC 81 03/13/2023 0955    CBC    Component Value Date/Time   WBC 4.1 07/15/2024 1437   WBC 4.7 05/19/2024 1200   RBC 3.92 07/15/2024 1437   RBC 4.43 05/19/2024 1200    HGB 11.4 07/15/2024 1437   HCT 35.4 07/15/2024 1437   PLT 265 07/15/2024 1437   MCV 90 07/15/2024 1437   MCH 29.1 07/15/2024 1437   MCH 28.2 05/19/2024 1200   MCHC 32.2 07/15/2024 1437   MCHC 31.3 05/19/2024 1200   RDW 14.8 07/15/2024 1437   LYMPHSABS 2.1 07/15/2024 1437   MONOABS 400 12/06/2016 1012   EOSABS 0.1 07/15/2024 1437   BASOSABS 0.0 07/15/2024 1437    Lab Results  Component Value Date   HGBA1C 5.7 07/31/2024       Assessment & Plan Status post left knee replacement Post-operative recovery progressing well with decreased swelling and improved function. Physical therapy ongoing, transportation challenging. No current use of Voltaren  gel or muscle relaxants. - Continue physical therapy for knee rehabilitation. - Discontinue Voltaren  gel and muscle relaxants. - Follow up with orthopedics on October 14.  Intention tremor of hands Slight improvement with occupational therapy. Tremor more noticeable when fatigued but not significantly impairing daily activities. - Continue occupational therapy with focus on motor skills exercises. - Attend next occupational therapy appointment on October 14. - Monitor tremor symptoms and adjust activities to prevent fatigue.  Essential hypertension Blood pressure well-controlled with lisinopril -hydrochlorothiazide  -Counseled on blood pressure goal of less than 130/80, low-sodium, DASH diet, medication compliance, 150 minutes of moderate intensity exercise per week. Discussed medication compliance, adverse effects.  Pure hypercholesterolemia cholesterol managed with atorvastatin . Recent labs satisfactory. - Continue atorvastatin  for cholesterol management.   Meralgia paresthetica Stable on gabapentin     Healthcare maintenance Pap smear no longer indicated due to age Up-to-date on breast cancer screening Up-to-date on colon cancer screening   Meds ordered this encounter  Medications   atorvastatin  (LIPITOR) 40 MG tablet     Sig: Take 1 tablet (40 mg total) by mouth daily.    Dispense:  90 tablet    Refill:  1   gabapentin  (NEURONTIN ) 600 MG tablet    Sig: Take 1 tablet (600 mg total) by mouth 3 (three) times daily.    Dispense:  270 tablet    Refill:  0   lisinopril -hydrochlorothiazide  (ZESTORETIC ) 20-25 MG tablet    Sig: Take 1 tablet by mouth daily.    Dispense:  90 tablet    Refill:  1    Follow-up: Return in about 6 months (around 01/29/2025) for Chronic medical conditions.       Corrina Sabin, MD, FAAFP. [facility] [place], [place] [phone]   07/31/2024, 12:30 PM

## 2024-08-04 NOTE — Anesthesia Preprocedure Evaluation (Signed)
 Anesthesia Evaluation  Patient identified by MRN, date of birth, ID band Patient awake    Reviewed: Allergy & Precautions, H&P , NPO status , Patient's Chart, lab work & pertinent test results  Airway Mallampati: III  TM Distance: >3 FB Neck ROM: Full    Dental  (+) Dental Advisory Given, Teeth Intact   Pulmonary neg pulmonary ROS   Pulmonary exam normal breath sounds clear to auscultation       Cardiovascular Exercise Tolerance: Good hypertension, pulmonary hypertension+ Valvular Problems/Murmurs (mild-mod pulmonic stenosis) MR  Rhythm:Regular Rate:Normal + Systolic murmurs Echo 04/2024  1. Left ventricular ejection fraction, by estimation, is 60 to 65%. Left  ventricular ejection fraction by 3D volume is 67 %. The left ventricle has  normal function. The left ventricle has no regional wall motion  abnormalities. Left ventricular diastolic parameters were normal.   2. Right ventricular systolic function is normal. The right ventricular  size is normal. There is mildly elevated pulmonary artery systolic  pressure. The estimated right ventricular systolic pressure is 43.7 mmHg.   3. Left atrial size was severely dilated.   4. Large, eccentric, anteriorally directed jet that wraps the left atrium  with significant splay. Suspect likely severe, TEE may be helpful for  further characterization. The mitral valve is normal in structure. Severe  mitral valve regurgitation. No evidence of mitral stenosis.   5. Increased aortic valve gradients measured, but the valve appears thin,  no calcifications, and opens well. Suspect measured gradient from  eccentric MR along CW interrogation line. The aortic valve is tricuspid.  Aortic valve regurgitation is mild to moderate. No aortic stenosis is present.   6. 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   7. The inferior vena cava is normal in size with greater than 50%  respiratory variability,  suggesting right atrial pressure of 3 mmHg.     Neuro/Psych  Headaches, Seizures -,   Neuromuscular disease CVA  negative psych ROS   GI/Hepatic negative GI ROS, Neg liver ROS,,,  Endo/Other  negative endocrine ROS    Renal/GU negative Renal ROS     Musculoskeletal negative musculoskeletal ROS (+) Arthritis ,    Abdominal  (+) + obese  Peds  Hematology negative hematology ROS (+)   Anesthesia Other Findings   Reproductive/Obstetrics                              Anesthesia Physical Anesthesia Plan  ASA: 4  Anesthesia Plan: MAC   Post-op Pain Management: Minimal or no pain anticipated   Induction: Intravenous  PONV Risk Score and Plan: 2 and Propofol  infusion and Ondansetron   Airway Management Planned: Natural Airway and Simple Face Mask  Additional Equipment: None  Intra-op Plan:   Post-operative Plan:   Informed Consent: I have reviewed the patients History and Physical, chart, labs and discussed the procedure including the risks, benefits and alternatives for the proposed anesthesia with the patient or authorized representative who has indicated his/her understanding and acceptance.     Dental advisory given  Plan Discussed with: CRNA  Anesthesia Plan Comments: (PAT note by Lynwood Hope, PA-C:  63 year old female follows with cardiology for history of mitral regurgitation, aortic insufficiency, pulmonic stenosis, HTN, hemorrhagic stroke 2014, HLD.  Recent echo 05/14/2024 showed EF 60 to 65%, normal RV, mildly elevated PASP 43.7 mmHg, severely dilated LA, severe mitral regurgitation, mild aortic regurgitation, mild to moderate pulmonic stenosis.  Seen by Reche Finder, NP on 05/23/2024 for  preop evaluation.  Per note, Discussed with Dr. Raford as DOD in clinic. As asymptomatic, may proceed with knee surgery as above. We will plan for TEE and consult to valve team after knee surgery. Will call her 2 weeks after knee surgery to see how  she is recovering and schedule TEE.SABRASABRA According to the Revised Cardiac Risk Index (RCRI), her Perioperative Risk of Major Cardiac Event is (%): 0.4. Her Functional Capacity in METs is: 5.07 according to the Duke Activity Status Index (DASI).  Per AHA/ACC guidelines, she is deemed acceptable risk for the planned procedure without additional cardiovascular testing. Will route to surgical team so they are aware.   She is already holding Aspirin  5 days prior to procedure   Other pertinent history includes seizures well-controlled on Keppra , chronic headaches, tremor.  Preop labs reviewed, WNL.  EKG 05/19/2024: NSR. Rate 62.  TTE 05/14/2024: 1. Left ventricular ejection fraction, by estimation, is 60 to 65%. Left  ventricular ejection fraction by 3D volume is 67 %. The left ventricle has  normal function. The left ventricle has no regional wall motion  abnormalities. Left ventricular diastolic  parameters were normal.  2. Right ventricular systolic function is normal. The right ventricular  size is normal. There is mildly elevated pulmonary artery systolic  pressure. The estimated right ventricular systolic pressure is 43.7 mmHg.  3. Left atrial size was severely dilated.  4. Large, eccentric, anteriorally directed jet that wraps the left atrium  with significant splay. Suspect likely severe, TEE may be helpful for  further characterization. The mitral valve is normal in structure. Severe  mitral valve regurgitation. No  evidence of mitral stenosis.  5. Increased aortic valve gradients measured, but the valve appears thin,  no calcifications, and opens well. Suspect measured gradient from  eccentric MR along CW interrogation line. The aortic valve is tricuspid.  Aortic valve regurgitation is mild to  moderate. No aortic stenosis is present.  6. 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  7. The inferior vena cava is normal in size with greater than 50%  respiratory variability, suggesting  right atrial pressure of 3 mmHg.    )         Anesthesia Quick Evaluation

## 2024-08-04 NOTE — Progress Notes (Signed)
 Pt called for pre procedure instructions. Arrival time 0645 NPO after midnight explained Instructed pt need for ride home tomorrow and have responsible adult with them for 24 hrs post procedure.

## 2024-08-05 ENCOUNTER — Ambulatory Visit (HOSPITAL_COMMUNITY)
Admission: RE | Admit: 2024-08-05 | Discharge: 2024-08-05 | Disposition: A | Source: Ambulatory Visit | Attending: Family | Admitting: Cardiology

## 2024-08-05 ENCOUNTER — Encounter (HOSPITAL_COMMUNITY): Payer: Self-pay | Admitting: Cardiology

## 2024-08-05 ENCOUNTER — Other Ambulatory Visit: Payer: Self-pay

## 2024-08-05 ENCOUNTER — Ambulatory Visit (HOSPITAL_COMMUNITY)
Admission: RE | Admit: 2024-08-05 | Discharge: 2024-08-05 | Disposition: A | Discharge status: Home / Self Care | Attending: Cardiology | Admitting: Cardiology

## 2024-08-05 ENCOUNTER — Ambulatory Visit (HOSPITAL_BASED_OUTPATIENT_CLINIC_OR_DEPARTMENT_OTHER): Payer: Self-pay | Admitting: Family

## 2024-08-05 ENCOUNTER — Ambulatory Visit (HOSPITAL_BASED_OUTPATIENT_CLINIC_OR_DEPARTMENT_OTHER): Payer: Self-pay | Admitting: Anesthesiology

## 2024-08-05 ENCOUNTER — Ambulatory Visit (HOSPITAL_COMMUNITY): Payer: Self-pay | Admitting: Anesthesiology

## 2024-08-05 ENCOUNTER — Encounter: Admitting: Physician Assistant

## 2024-08-05 ENCOUNTER — Encounter (HOSPITAL_COMMUNITY)
Admission: RE | Disposition: A | Discharge status: Home / Self Care | Payer: Self-pay | Source: Home / Self Care | Attending: Cardiology

## 2024-08-05 DIAGNOSIS — I10 Essential (primary) hypertension: Secondary | ICD-10-CM

## 2024-08-05 DIAGNOSIS — I340 Nonrheumatic mitral (valve) insufficiency: Secondary | ICD-10-CM

## 2024-08-05 DIAGNOSIS — I351 Nonrheumatic aortic (valve) insufficiency: Secondary | ICD-10-CM

## 2024-08-05 DIAGNOSIS — E785 Hyperlipidemia, unspecified: Secondary | ICD-10-CM | POA: Insufficient documentation

## 2024-08-05 DIAGNOSIS — I371 Nonrheumatic pulmonary valve insufficiency: Secondary | ICD-10-CM | POA: Diagnosis not present

## 2024-08-05 DIAGNOSIS — I77810 Thoracic aortic ectasia: Secondary | ICD-10-CM | POA: Diagnosis not present

## 2024-08-05 DIAGNOSIS — R569 Unspecified convulsions: Secondary | ICD-10-CM | POA: Insufficient documentation

## 2024-08-05 DIAGNOSIS — I639 Cerebral infarction, unspecified: Secondary | ICD-10-CM

## 2024-08-05 DIAGNOSIS — Z8673 Personal history of transient ischemic attack (TIA), and cerebral infarction without residual deficits: Secondary | ICD-10-CM | POA: Insufficient documentation

## 2024-08-05 DIAGNOSIS — Q256 Stenosis of pulmonary artery: Secondary | ICD-10-CM | POA: Diagnosis not present

## 2024-08-05 HISTORY — PX: TRANSESOPHAGEAL ECHOCARDIOGRAM (CATH LAB): EP1270

## 2024-08-05 LAB — ECHO TEE

## 2024-08-05 SURGERY — TRANSESOPHAGEAL ECHOCARDIOGRAM (TEE) (CATHLAB)
Anesthesia: Monitor Anesthesia Care

## 2024-08-05 MED ORDER — PROPOFOL 10 MG/ML IV BOLUS
INTRAVENOUS | Status: DC | PRN
  Administered 2024-08-05 (×2): 10 mg via INTRAVENOUS
  Administered 2024-08-05: 20 mg via INTRAVENOUS
  Administered 2024-08-05: 80 ug/kg/min via INTRAVENOUS
  Administered 2024-08-05: 30 mg via INTRAVENOUS

## 2024-08-05 MED ORDER — SODIUM CHLORIDE 0.9 % IV SOLN: INTRAVENOUS | Status: DC | PRN

## 2024-08-05 MED ORDER — LIDOCAINE 2% (20 MG/ML) 5 ML SYRINGE
INTRAMUSCULAR | Status: DC | PRN
  Administered 2024-08-05: 60 mg via INTRAVENOUS

## 2024-08-05 MED ORDER — SODIUM CHLORIDE 0.9 % IV SOLN: INTRAVENOUS | Status: DC

## 2024-08-05 NOTE — Transfer of Care (Signed)
 Immediate Anesthesia Transfer of Care Note  Patient: Alison Ward  Procedure(s) Performed: TRANSESOPHAGEAL ECHOCARDIOGRAM  Patient Location: PACU  Anesthesia Type:MAC  Level of Consciousness: awake  Airway & Oxygen Therapy: Patient Spontanous Breathing  Post-op Assessment: Report given to RN  Post vital signs: stable  Last Vitals:  Vitals Value Taken Time  BP 114/65 08/05/24 08:20  Temp 36.3 C 08/05/24 08:19  Pulse 74 08/05/24 08:21  Resp 17 08/05/24 08:21  SpO2 99 % 08/05/24 08:21  Vitals shown include unfiled device data.  Last Pain:  Vitals:   08/05/24 0819  TempSrc: Tympanic  PainSc: Asleep         Complications: No notable events documented.

## 2024-08-05 NOTE — Interval H&P Note (Signed)
 History and Physical Interval Note:  08/05/2024 7:42 AM  Alison Ward  has presented today for surgery, with the diagnosis of SEVER MITRAL REGURG.  The various methods of treatment have been discussed with the patient and family. After consideration of risks, benefits and other options for treatment, the patient has consented to  Procedure(s): TRANSESOPHAGEAL ECHOCARDIOGRAM (N/A) as a surgical intervention.  The patient's history has been reviewed, patient examined, no change in status, stable for surgery.  I have reviewed the patient's chart and labs.  Questions were answered to the patient's satisfaction.     Lonni LITTIE Nanas

## 2024-08-05 NOTE — Discharge Instructions (Signed)

## 2024-08-05 NOTE — CV Procedure (Signed)
     TRANSESOPHAGEAL ECHOCARDIOGRAM   NAME:  Alison Ward   MRN: [id number] DOB:  1961/08/03   ADMIT DATE: 08/05/2024  INDICATIONS: MR  PROCEDURE:   Informed consent was obtained prior to the procedure. The risks, benefits and alternatives for the procedure were discussed and the patient comprehended these risks.  Risks include, but are not limited to, cough, sore throat, vomiting, nausea, somnolence, esophageal and stomach trauma or perforation, bleeding, low blood pressure, aspiration, pneumonia, infection, trauma to the teeth and death.    After a procedural time-out, the oropharynx was anesthetized and the patient was sedated by the anesthesia service. The transesophageal probe was inserted in the esophagus and stomach without difficulty and multiple views were obtained. Anesthesia was monitored by Josette Leisure, CRNA.    COMPLICATIONS:    There were no immediate complications.  FINDINGS:  Flail P3, severe MR   Lonni Nanas MD [place]  [facility]    8:37 AM

## 2024-08-06 ENCOUNTER — Telehealth: Payer: Self-pay

## 2024-08-06 NOTE — Telephone Encounter (Signed)
 Per Edwards,  P3 prolapse visualized. Eccentric anteriorly directed jet. Given gradient of 3.5 mmHg, suggest 1 ACE at A3/P3 and assess need for second ACE lateral to first implant.

## 2024-08-06 NOTE — Anesthesia Postprocedure Evaluation (Signed)
 Anesthesia Post Note  Patient: Alison Ward  Procedure(s) Performed: TRANSESOPHAGEAL ECHOCARDIOGRAM     Patient location during evaluation: Cath Lab Anesthesia Type: MAC Level of consciousness: awake and alert Pain management: pain level controlled Vital Signs Assessment: post-procedure vital signs reviewed and stable Respiratory status: spontaneous breathing Cardiovascular status: stable Anesthetic complications: no   No notable events documented.  Last Vitals:  Vitals:   08/05/24 0829 08/05/24 0839  BP: 106/68 109/73  Pulse: 66 64  Resp: 18 19  Temp:    SpO2: 96% 97%    Last Pain:  Vitals:   08/05/24 0839  TempSrc:   PainSc: 0-No pain                 [name]

## 2024-08-07 NOTE — Progress Notes (Unsigned)
 Patient ID: Alison Ward MRN: [id number] DOB/AGE: 1961-06-19 63 y.o.  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 The patient is a 63 year old female with above listed medical problems referred for recommendations regarding her severe mitral regurgitation seen on echocardiogram from July 2025.  She was seen recently and was doing well without cardiovascular complaints.  No symptoms of shortness of breath, chest discomfort, palpitations, or lightheadedness. She remains physically active, frequently using stairs and participating in church activities.  In 2014, she experienced a cerebral bleed due to high blood pressure, requiring surgical evacuation. She developed seizures post-event and is on medication for seizure control. No seizures or further cerebral bleeding since. She elevates her head while sleeping to prevent potential seizure-related issues.  She underwent knee replacement surgery on May 26, 2024, and is currently undergoing physical therapy, which was delayed by four to five weeks due to a referral issue. No pain reported but experiences some stiffness, which is improving with therapy.     Past Medical History:  Diagnosis Date   Arthritis    Chronic headaches    daily, bitemporal, associated with tingling in face   Heart murmur    Hypertension    Mitral regurgitation    Pre-diabetes    Seizures (HCC)    partial seizures last one on 10/22/15  On Keppra  currently - last seizure 2021   Stroke (HCC) 07/05/2013   Tremor    in hands, more in right    Past Surgical History:  Procedure Laterality Date   ABDOMINAL HYSTERECTOMY N/A 12/15/2015   Procedure:  HYSTERECTOMY ABDOMINAL;  Surgeon: Harland JAYSON Birkenhead, MD;  Location: [facility];  Service: Gynecology;  Laterality: N/A;   CESAREAN SECTION     COLONOSCOPY     POLYPECTOMY     SALPINGOOPHORECTOMY Bilateral 12/15/2015   Procedure: SALPINGO OOPHORECTOMY;  Surgeon: Harland JAYSON Birkenhead, MD;  Location: [facility];  Service: Gynecology;  Laterality: Bilateral;   TOTAL KNEE ARTHROPLASTY Left 05/26/2024   Procedure: LEFT TOTAL KNEE ARTHROPLASTY;  Surgeon: Jerri Kay HERO, MD;  Location: [facility];  Service: Orthopedics;  Laterality: Left;   TRANSESOPHAGEAL ECHOCARDIOGRAM (CATH LAB) N/A 08/05/2024   Procedure: TRANSESOPHAGEAL ECHOCARDIOGRAM;  Surgeon: Kate Lonni CROME, MD;  Location: [facility] INVASIVE CV LAB;  Service: Cardiovascular;  Laterality: N/A;   VENTRICULOSTOMY Left 07/07/2013   Procedure: VENTRICULOSTOMY;  Surgeon: Reyes JONETTA Budge, MD;  Location: [facility] NEURO ORS;  Service: Neurosurgery;  Laterality: Left;  Left Ventriculostomy and Removal of Right Vetriculostomy    Family History  Problem Relation Age of Onset   Hypertension Mother    Diabetes Mother    Stroke Mother    Hypertension Father    Colon cancer Neg Hx    Breast cancer Neg Hx    Colon polyps Neg Hx    Esophageal cancer Neg Hx    Rectal cancer Neg Hx    Stomach cancer Neg Hx     Social History   Socioeconomic History   Marital status: Divorced    Spouse name: Not on file   Number of children: 3   Years of education: college   Highest education level: Not on file  Occupational History   Occupation: [employer]  Tobacco Use   Smoking status: Never   Smokeless tobacco: Never  Vaping Use   Vaping status: Never Used  Substance and Sexual Activity   Alcohol use: No   Drug use: No   Sexual activity: Not Currently    Birth control/protection: Surgical  Other Topics Concern   Not on file  Social History Narrative   Not on file   Social Drivers of Health   Financial Resource Strain: Low Risk  (01/29/2024)   Overall Financial Resource  Strain (CARDIA)    Difficulty of Paying Living Expenses: Not very hard  Food Insecurity: No Food Insecurity (01/29/2024)   Hunger Vital Sign    Worried About Running Out of Food in the Last Year: Never true    Ran Out of Food in the Last Year: Never true  Transportation Needs: No Transportation Needs (01/29/2024)   PRAPARE - Administrator, Civil Service (Medical): No    Lack of Transportation (Non-Medical): No  Physical Activity: Insufficiently Active (01/29/2024)   Exercise Vital Sign    Days of Exercise per Week: 3 days    Minutes of Exercise per Session: 20 min  Stress: No Stress Concern Present (01/29/2024)   [employer] of Occupational Health - Occupational Stress Questionnaire    Feeling of Stress : Not at all  Social Connections: Moderately Integrated (01/29/2024)   Social Connection and Isolation Panel    Frequency of Communication with Friends and Family: Twice a week    Frequency of Social Gatherings with Friends and Family: Twice a week    Attends Religious Services: More than 4 times per year    Active Member of [employer] or Organizations: Yes    Attends Banker Meetings: More than 4 times per year    Marital Status: Never married  Intimate Partner Violence: Not At Risk (01/29/2024)   Humiliation, Afraid, Rape, and Kick questionnaire    Fear of Current or Ex-Partner: No    Emotionally Abused: No    Physically Abused: No    Sexually Abused: No     Prior to Admission medications   Medication Sig Start Date End Date Taking? Authorizing Provider  aspirin  EC 81 MG tablet Take 81 mg by mouth daily. Swallow whole.    [provider]  atorvastatin  (LIPITOR) 40 MG tablet Take 1 tablet (40 mg total) by mouth daily. 07/31/24   Newlin, Enobong, MD  gabapentin  (NEURONTIN ) 600 MG tablet Take 1 tablet (600 mg total) by mouth 3 (three) times daily. 07/31/24   Newlin, Enobong, MD  levETIRAcetam  (KEPPRA  XR) 500 MG 24 hr tablet TAKE 1 TABLET BY MOUTH EVERYDAY AT  BEDTIME 07/22/24   Whitfield Raisin, NP  lisinopril -hydrochlorothiazide  (ZESTORETIC ) 20-25 MG tablet Take 1 tablet by mouth daily. 07/31/24   Newlin, Enobong, MD    Allergies  Allergen Reactions   Elavil  [Amitriptyline ] Other (See Comments)    DIZZINESS   Topiramate  Er Other (See Comments)    Suicidal Thoughts- Trokendi     REVIEW OF SYSTEMS:  General: no fevers/chills/night sweats Eyes: no blurry vision, diplopia, or amaurosis ENT: no sore throat or hearing loss Resp: no cough, wheezing, or hemoptysis CV: no edema or palpitations GI: no abdominal pain, nausea, vomiting, diarrhea, or constipation GU: no dysuria, frequency, or hematuria Skin: no rash Neuro: no headache, numbness, tingling, or weakness of extremities Musculoskeletal: no joint pain or swelling Heme: no bleeding, DVT, or easy bruising Endo: no polydipsia or polyuria  BP 113/75   Pulse ROLLEN)  59   Ht 5' 8 (1.727 m)   Wt 233 lb 8 oz (105.9 kg)   LMP 11/24/2011   SpO2 100%   BMI 35.50 kg/m   PHYSICAL EXAM: GEN:  AO x 3 in no acute distress HEENT: normal Dentition: Normal Neck: JVP normal. +2 carotid upstrokes without bruits. No thyromegaly. Lungs: equal expansion, clear bilaterally CV: Apex is discrete and nondisplaced, RRR with 3/6 holosystolic murmur throughout the precordium Abd: soft, non-tender, non-distended; no bruit; positive bowel sounds Ext: no edema, ecchymoses, or cyanosis Vascular: 2+ femoral pulses, 2+ radial pulses       Skin: warm and dry without rash Neuro: CN II-XII grossly intact; motor and sensory grossly intact    DATA AND STUDIES:  EKG: 2025 normal sinus rhythm  EKG Interpretation Date/Time:    Ventricular Rate:    PR Interval:    QRS Duration:    QT Interval:    QTC Calculation:   R Axis:      Text Interpretation:          CARDIAC STUDIES: Refer to CV Procedures and Imaging Tabs  01/29/2024: ALT 15 06/26/2024: TSH 0.893 07/15/2024: BUN 12; Creatinine, Ser 0.89; Hemoglobin  11.4; Platelets 265; Potassium 3.6; Sodium 140   STS RISK CALCULATOR: Pending  NHYA CLASS: 1     ASSESSMENT AND PLAN:   1. Nonrheumatic mitral valve regurgitation   2. Dyslipidemia   3. Essential hypertension   4. Nontraumatic intracerebral hemorrhage, unspecified cerebral location, unspecified laterality (HCC)   5. Simple partial seizure disorder (HCC)   6. CKD (chronic kidney disease) stage 2, GFR 60-89 ml/min   7. BMI 37.0-37.9, adult     Mitral regurgitation: The patient has developed severe degenerative mitral regurgitation.  She is completely asymptomatic.  Her EF is preserved.  I had a long discussion with the patient and her daughter.  I think given her age she would be best served with a surgical repair.  The timing needs to be determined.  She is recuperating from left knee surgery.  She is very active and able to go up and down stairs without any issues. I will see her back in 6 months with another echocardiogram.  If she develops symptoms, atrial fibrillation, or LV dysfunction we will pursue preprocedure coronary angiography and place a surgical referral.  Patient understands and agrees with this plan. Dyslipidemia: Continue atorvastatin  40 mg Hypertension: Continue lisinopril /hydrochlorothiazide  20/25 mg daily.  BP is well-controlled today ICH: Tolerating aspirin  81 mg. Seizure disorder: Continue Keppra  500 mg daily CKD stage II: Continue lisinopril /hydrochlorothiazide  20/20 Elevated BMI: Diet and excise modification  I have personally reviewed the patients imaging data as summarized above.  I have reviewed the natural history of mitral regurgitation with the patient and family members who are present today. We have discussed the limitations of medical therapy and the poor prognosis associated with symptomatic mitral regurgitation. We have also reviewed potential treatment options, including palliative medical therapy, conventional mitral surgery, and transcatheter mitral  edge-to-edge repair. We discussed treatment options in the context of this patient's specific comorbid medical conditions.   All of the patient's questions were answered today. Will make further recommendations based on the results of studies outlined above.   I spent 48 minutes reviewing all clinical data during and prior to this visit including all relevant imaging studies, laboratories, clinical information from other health systems and prior notes from both Cardiology and other specialties, interviewing the patient, conducting a complete physical examination, and coordinating care in order  to formulate a comprehensive and personalized evaluation and treatment plan.   Kowen Kluth K Cady Hafen, MD  08/08/2024 9:29 AM    [facility] [address] [place], [place], [place]  [address] Phone: [phone]; Fax: [phone]

## 2024-08-08 ENCOUNTER — Ambulatory Visit: Attending: Internal Medicine | Admitting: Internal Medicine

## 2024-08-08 ENCOUNTER — Encounter: Payer: Self-pay | Admitting: Internal Medicine

## 2024-08-08 VITALS — BP 113/75 | HR 59 | Ht 68.0 in | Wt 233.5 lb

## 2024-08-08 DIAGNOSIS — N182 Chronic kidney disease, stage 2 (mild): Secondary | ICD-10-CM | POA: Diagnosis present

## 2024-08-08 DIAGNOSIS — I10 Essential (primary) hypertension: Secondary | ICD-10-CM | POA: Insufficient documentation

## 2024-08-08 DIAGNOSIS — Z6837 Body mass index (BMI) 37.0-37.9, adult: Secondary | ICD-10-CM | POA: Diagnosis present

## 2024-08-08 DIAGNOSIS — I340 Nonrheumatic mitral (valve) insufficiency: Secondary | ICD-10-CM | POA: Insufficient documentation

## 2024-08-08 DIAGNOSIS — E785 Hyperlipidemia, unspecified: Secondary | ICD-10-CM | POA: Diagnosis not present

## 2024-08-08 DIAGNOSIS — G40109 Localization-related (focal) (partial) symptomatic epilepsy and epileptic syndromes with simple partial seizures, not intractable, without status epilepticus: Secondary | ICD-10-CM | POA: Insufficient documentation

## 2024-08-08 DIAGNOSIS — I619 Nontraumatic intracerebral hemorrhage, unspecified: Secondary | ICD-10-CM | POA: Insufficient documentation

## 2024-08-08 NOTE — Patient Instructions (Signed)
 Medication Instructions:  No medication changes were made at this visit. Continue current regimen.   *If you need a refill on your cardiac medications before your next appointment, please call your pharmacy*  Lab Work: None ordered today. If you have labs (blood work) drawn today and your tests are completely normal, you will receive your results only by: MyChart Message (if you have MyChart) OR A paper copy in the mail If you have any lab test that is abnormal or we need to change your treatment, we will call you to review the results.  Testing/Procedures: Your physician has requested that you have an echocardiogram prior to 6 month follow-up with Dr. Wendel. Echocardiography is a painless test that uses sound waves to create images of your heart. It provides your doctor with information about the size and shape of your heart and how well your heart's chambers and valves are working. This procedure takes approximately one hour. There are no restrictions for this procedure. Please do NOT wear cologne, perfume, aftershave, or lotions (deodorant is allowed). Please arrive 15 minutes prior to your appointment time.  Please note: We ask at that you not bring children with you during ultrasound (echo/ vascular) testing. Due to room size and safety concerns, children are not allowed in the ultrasound rooms during exams. Our front office staff cannot provide observation of children in our lobby area while testing is being conducted. An adult accompanying a patient to their appointment will only be allowed in the ultrasound room at the discretion of the ultrasound technician under special circumstances. We apologize for any inconvenience.   Follow-Up: At [facility], you and your health needs are our priority.  As part of our continuing mission to provide you with exceptional heart care, our providers are all part of one team.  This team includes your primary Cardiologist (physician) and  Advanced Practice Providers or APPs (Physician Assistants and Nurse Practitioners) who all work together to provide you with the care you need, when you need it.  Your next appointment:   6 month(s)  Provider:   Arun Thukkani, MD

## 2024-08-11 ENCOUNTER — Telehealth: Payer: Self-pay

## 2024-08-11 ENCOUNTER — Encounter: Payer: Self-pay | Admitting: Rehabilitative and Restorative Service Providers"

## 2024-08-11 ENCOUNTER — Ambulatory Visit: Attending: Physician Assistant | Admitting: Rehabilitative and Restorative Service Providers"

## 2024-08-11 DIAGNOSIS — M25562 Pain in left knee: Secondary | ICD-10-CM | POA: Diagnosis present

## 2024-08-11 DIAGNOSIS — M25662 Stiffness of left knee, not elsewhere classified: Secondary | ICD-10-CM | POA: Diagnosis present

## 2024-08-11 DIAGNOSIS — M6281 Muscle weakness (generalized): Secondary | ICD-10-CM | POA: Insufficient documentation

## 2024-08-11 DIAGNOSIS — R262 Difficulty in walking, not elsewhere classified: Secondary | ICD-10-CM | POA: Insufficient documentation

## 2024-08-11 NOTE — Therapy (Signed)
 OUTPATIENT PHYSICAL THERAPY TREATMENT NOTE   Patient Name: Alison Ward MRN: [id number] DOB:10-14-1961, 63 y.o., female Today's Date: 08/11/2024  END OF SESSION:  PT End of Session - 08/11/24 1221     Visit Number 3    Date for Recertification  09/11/24    Authorization Type Medicare/ Medicaid    PT Start Time 1220    PT Stop Time 1300    PT Time Calculation (min) 40 min    Activity Tolerance Patient tolerated treatment well    Behavior During Therapy [facility] for tasks assessed/performed          Past Medical History:  Diagnosis Date   Arthritis    Chronic headaches    daily, bitemporal, associated with tingling in face   Heart murmur    Hypertension    Mitral regurgitation    Pre-diabetes    Seizures (HCC)    partial seizures last one on 10/22/15  On Keppra  currently - last seizure 2021   Stroke (HCC) 07/05/2013   Tremor    in hands, more in right   Past Surgical History:  Procedure Laterality Date   ABDOMINAL HYSTERECTOMY N/A 12/15/2015   Procedure: HYSTERECTOMY ABDOMINAL;  Surgeon: Harland JAYSON Birkenhead, MD;  Location: [facility];  Service: Gynecology;  Laterality: N/A;   CESAREAN SECTION     COLONOSCOPY     POLYPECTOMY     SALPINGOOPHORECTOMY Bilateral 12/15/2015   Procedure: SALPINGO OOPHORECTOMY;  Surgeon: Harland JAYSON Birkenhead, MD;  Location: [facility];  Service: Gynecology;  Laterality: Bilateral;   TOTAL KNEE ARTHROPLASTY Left 05/26/2024   Procedure: LEFT TOTAL KNEE ARTHROPLASTY;  Surgeon: Jerri Kay HERO, MD;  Location: [facility];  Service: Orthopedics;  Laterality: Left;   TRANSESOPHAGEAL ECHOCARDIOGRAM (CATH LAB) N/A 08/05/2024   Procedure: TRANSESOPHAGEAL ECHOCARDIOGRAM;  Surgeon: Kate Lonni CROME, MD;  Location: [facility] INVASIVE CV LAB;  Service: Cardiovascular;  Laterality: N/A;   VENTRICULOSTOMY Left 07/07/2013   Procedure: VENTRICULOSTOMY;  Surgeon: Reyes JONETTA Budge, MD;  Location: [facility];  Service: Neurosurgery;  Laterality: Left;  Left Ventriculostomy and Removal  of Right Vetriculostomy   Patient Active Problem List   Diagnosis Date Noted   Severe mitral regurgitation 08/05/2024   Status post total left knee replacement 05/26/2024   Acute pain of right knee 05/23/2023   Murmur, cardiac 01/24/2023   Primary osteoarthritis of left knee 05/19/2022   Lumbar radiculopathy 03/11/2018   Ganglion cyst of right foot 07/04/2017   Chronic tension-type headache, not intractable 12/30/2015   Post-operative state 12/15/2015   Simple partial seizure disorder (HCC) 11/04/2015   Left sided numbness 09/25/2015   Leukopenia 09/25/2015   Tension vascular headache 09/07/2015   Meralgia paresthetica of left side 09/07/2015   Fainting spell 09/07/2015   Prediabetes 09/02/2015   Stye 08/06/2014   Essential hypertension, benign 04/16/2014   CVA (cerebral vascular accident) (HCC) 04/16/2014   Headache 04/16/2014   Tension headache 03/06/2014   Obesity, morbid (HCC) 08/13/2013   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   ICH (intracerebral hemorrhage) (HCC) 07/21/2013   Hypokalemia 07/21/2013   Intracerebral hemorrhage (HCC) 07/06/2013   Essential hypertension 02/24/2009    PCP: Delbert Clam MD  REFERRING PROVIDER: Jule Ronal CROME PA-C  REFERRING DIAG: [phone] s/p total left knee replacement  THERAPY DIAG:  Left knee pain; weakness; left knee stiffness  Rationale for Evaluation and Treatment: Rehabilitation  ONSET DATE: > 1 year; surgery 7/28  SUBJECTIVE:   SUBJECTIVE STATEMENT: Pt reports that she is going to have  heart surgery in approximately 6 months.  She will have another ECG in March and it will be decided then.  Patient follows up with ortho tomorrow.  Patient denies pain.   PERTINENT HISTORY: 05/26/24 left TKR Has a leaky valve and having a cardiac procedure to have a better view Oct 7; doing OT for hand tremors since increased since surgery History of CVA, hemorrhagic brain infarction; HTN  PAIN:   Are you having  pain? No NPRS scale: 0/10 Pain location: Left knee not hurting now but having right hip pain since surgery Pain orientation: Right and Left  PAIN TYPE: aching Pain description: intermittent  Aggravating factors: certain movements like getting in/out of the car Relieving factors: ice   PRECAUTIONS: None    WEIGHT BEARING RESTRICTIONS: No  FALLS:  Has patient fallen in last 6 months? No  LIVING ENVIRONMENT: Lives in: House/apartment Stairs: Yes: External: 15 steps; on right going up doing one step at a time   OCCUPATION: retired   PLOF: Independent  PATIENT GOALS: knee will be moving as it shoulder normally so not relying on right leg; turning over in bed easier while sleeping  NEXT MD VISIT: Post-Op on 08/12/24  OBJECTIVE:  Note: Objective measures were completed at Evaluation unless otherwise noted.  DIAGNOSTIC FINDINGS: left knee OA  PATIENT SURVEYS:  LEFS  Extreme difficulty/unable (0), Quite a bit of difficulty (1), Moderate difficulty (2), Little difficulty (3), No difficulty (4) Survey date:    9/18  Any of your usual work, housework or school activities 2  2. Usual hobbies, recreational or sporting activities 2  3. Getting into/out of the bath 2  4. Walking between rooms 4  5. Putting on socks/shoes 2  6. Squatting  2  7. Lifting an object, like a bag of groceries from the floor 4  8. Performing light activities around your home 4  9. Performing heavy activities around your home 3  10. Getting into/out of a car 2  11. Walking 2 blocks 3  12. Walking 1 mile 1  13. Going up/down 10 stairs (1 flight) 2  14. Standing for 1 hour 3  15.  sitting for 1 hour 3  16. Running on even ground 1  17. Running on uneven ground 1  18. Making sharp turns while running fast 1  19. Hopping  1  20. Rolling over in bed 2  Score total:  45/80     COGNITION: Overall cognitive status: Within functional limits for tasks assessed     EDEMA:  Moderate peri-patellar  edema  PALPATION: Decreased scar mobility; decreased patellar mobility  LOWER EXTREMITY ROM:  Active ROM Right eval Left eval Left 08/11/24  Hip flexion full full   Hip extension     Hip abduction     Hip adduction     Hip internal rotation     Hip external rotation     Knee flexion 120 93 sit/95 supine  98  Knee extension 0 7 sit and supine  5  Ankle dorsiflexion     Ankle plantarflexion     Ankle inversion     Ankle eversion      (Blank rows = not tested)  LOWER EXTREMITY MMT: + quad lag with SLR  MMT Right eval Left eval  Hip flexion 4 4  Hip extension    Hip abduction 4 4  Hip adduction    Hip internal rotation    Hip external rotation    Knee flexion 5 4-  Knee extension 5 3+  Ankle dorsiflexion    Ankle plantarflexion 4 4  Ankle inversion    Ankle eversion     (Blank rows = not tested)   FUNCTIONAL TESTS:  Eval: Moderate use of bil Ues to rise from a standard chair 3 minute walk test: 383 feet no device  07/28/2024: Timed up and Go (TUG):  15.02 sec 5 times sit to/from stand:  19.76 sec with decreased weight through LLE  GAIT:  Comments: no device but decreased stance time on left; lacks full knee extension; decreased speed; difficulty walking with her purse on one side                                                                                                                                TREATMENT DATE:   08/11/2024: Nustep level 4 x6 min with PT present to discuss status Seated long arc quad 2x10 left LE (with 2 sec hold) Seated heel slide with left foot on slider 2x10 Seated hamstring stretch 2x20 sec bilat Sit to/from stand x10 Sit to/from stand with right foot forward (to encourage weight-bearing through left LE) x10 Seated quad set 2x10 with left LE Standing at barre:  marching, hamstring curl, hip extension, and hip abduction.  x10 each bilat Standing left knee flexion stretch with one knee up on step 5x5-10 sec hold FWD  stepping over 4 small hurdles in parallel bars (with UE support) down and back x3 laps Side stepping  over 4 small hurdles in parallel bars (with UE support) down and back x2 laps   07/28/2024: Nustep level 4 x7 min with PT present to discuss status TUG and 5 times sit to/from stand Seated long arc quad 2x10 left LE (with 2 sec hold) Seated heel slide with left foot on slider 2x10 Standing at barre:  marching, hamstring curl, and hip abduction.  2x10 each bilat Supine straight leg raise 2x10 left LE (with cuing for slow pacing) Supine bridge 2x10 Seated quad set 2x10 with left LE     PATIENT EDUCATION:  Education details: Educated patient on anatomy and physiology of current symptoms, prognosis, plan of care as well as initial self care strategies to promote recovery Person educated: Patient Education method: Explanation Education comprehension: verbalized understanding  HOME EXERCISE PROGRAM: Access Code: [email or web address] URL: [email or web address] Date: 08/11/2024 Prepared by: Jarrell Shuayb Schepers  Exercises - Supine Heel Slide with Strap  - 5 x daily - 7 x weekly - 1 sets - 10 reps - Supine Knee Extension Strengthening  - 1 x daily - 7 x weekly - 1 sets - 10 reps - Active Straight Leg Raise with Quad Set (Mirrored)  - 1 x daily - 7 x weekly - 2 sets - 10 reps - Seated Knee Flexion Stretch (Mirrored)  - 5 x daily - 7 x weekly - 1 sets - 10 reps - Seated Long Arc Quad  - 1 x daily - 7 x  weekly - 2 sets - 10 reps - Standing March with Unilateral Counter Support  - 1 x daily - 7 x weekly - 2 sets - 10 reps - Standing Hip Abduction with Counter Support  - 1 x daily - 7 x weekly - 2 sets - 10 reps - Standing Knee Flexion AROM with Chair Support  - 1 x daily - 7 x weekly - 2 sets - 10 reps - Standing Knee Flexion Stretch on Step  - 5 x daily - 7 x weekly - 1 sets - 10 reps - Seated Quad Set  - 1 x daily - 7 x weekly - 2 sets - 10 reps - Staggered Sit-to-Stand  - 1 x daily - 7 x  weekly - 2 sets - 10 reps - Seated Hamstring Stretch  - 1 x daily - 7 x weekly - 2 reps - 20 sec hold  ASSESSMENT:  CLINICAL IMPRESSION:  Ms Frenkel presents to skilled PT reporting that she did well with her procedure, but it does appear that she will require surgery in approximately 6 months.  Patient states that she has been doing her exercises and denies any pain.  Patient was measured in the beginning of the session and she did have some gains in ROM noted since initial evaluation.  Patient with decreased weight bearing through LLE during sit to stands, so added staggered sit to stand with right foot forward to promote increased weight bearing through left leg.  Patient provided with updated HEP to continue with gains at home.  Patient continues to require skilled PT to progress towards goal related activities and improved knee strengthening.    OBJECTIVE IMPAIRMENTS: decreased activity tolerance, decreased mobility, difficulty walking, decreased ROM, decreased strength, increased edema, impaired perceived functional ability, and pain.   ACTIVITY LIMITATIONS: standing, squatting, sleeping, stairs, bed mobility, and locomotion level  PARTICIPATION LIMITATIONS: meal prep, cleaning, laundry, driving, shopping, community activity, and church  PERSONAL FACTORS: 1-2 comorbidities: HTN, prior CVA are also affecting patient's functional outcome.   REHAB POTENTIAL: Good  CLINICAL DECISION MAKING: Stable/uncomplicated  EVALUATION COMPLEXITY: Low   GOALS: Goals reviewed with patient? Yes  SHORT TERM GOALS: Target date: 08/14/2024   The patient will demonstrate knowledge of basic self care strategies and exercises to promote healing  Baseline: Goal status: Ongoing  2.   Improved knee ROM 5-105 for greater ease getting in/out of the car Baseline:  Goal status: Ongoing (see above)  3.  Able to rise from a standard chair with min use of UEs Baseline:  Goal status: Ongoing  4.  Improved  gait speed with patient able to walk 850 feet in 6 minutes Baseline:  Goal status: INITIAL     LONG TERM GOALS: Target date: 09/11/2024   The patient will be independent in a safe self progression of a home exercise program to promote further recovery of function  Baseline:  Goal status: INITIAL  2.  The patient will report a 75% improvement in pain levels with functional activities which are currently difficult including going to church, shopping, turning over in bed Baseline:  Goal status: INITIAL  3.  The patient will have improved LE strength of at least 4+/5 needed to ascend and descend steps reciprocally  Baseline:  Goal status: INITIAL  4.  Improved ROM 3-115 degrees needed for stairs, getting in/out of the car and for a normalized gait pattern Baseline:  Goal status: INITIAL  5.  Lower Extremity Functional Scale (LEFS) improved to  55   /80 indicating improved function with less pain Baseline:  Goal status: INITIAL    PLAN:  PT FREQUENCY: 2x/week  PT DURATION: 8 weeks  PLANNED INTERVENTIONS: 97164- PT Re-evaluation, 97110-Therapeutic exercises, 97530- Therapeutic activity, 97112- Neuromuscular re-education, 97535- Self Care, [id number]- Manual therapy, [phone]- Aquatic Therapy, [phone]- Electrical stimulation (unattended), [phone]- Electrical stimulation (manual), 97016- Vasopneumatic device, Patient/Family education, Balance training, Taping, Joint mobilization, Scar mobilization, Cryotherapy, and Moist heat  PLAN FOR NEXT SESSION: Nu-step or rocking on the bike; knee flexion and extension ROM; quad strengthening; manual therapy including scar massage and patellar mobilization; vasocompression as needed for edema control; progress HEP, 6 minute walk test    Jarrell Laming, PT, DPT 08/11/24, 1:12 PM  [facility] [address], [address] [place], [place] [address] Phone # [phone] Fax [phone]

## 2024-08-11 NOTE — Telephone Encounter (Signed)
 Alison Ward    No issues noted for trans septal access, device straddle or steering to the valve. TR noted. IC images show a medial P2 (possibly into P3) prolapse segment. Corresponding anteriorly directed eccentric jet seen throughout the study. Anterior leaflet is restricted with prominent chord. Posterior prolapsing segment measures .9-1.1cm in LVOT views. Suggest placing initial clip at the defect, then assessing residual MR. Recommend obtaining a dedicated bicom/xplane sweep of the valve from P3 into P2 to determine how medial the initial clip should be placed. XT/XTW appropriate in the P2 medial segment.

## 2024-08-12 ENCOUNTER — Ambulatory Visit (INDEPENDENT_AMBULATORY_CARE_PROVIDER_SITE_OTHER): Admitting: Physician Assistant

## 2024-08-12 ENCOUNTER — Ambulatory Visit: Attending: Family Medicine | Admitting: Occupational Therapy

## 2024-08-12 DIAGNOSIS — R251 Tremor, unspecified: Secondary | ICD-10-CM | POA: Diagnosis present

## 2024-08-12 DIAGNOSIS — R278 Other lack of coordination: Secondary | ICD-10-CM | POA: Diagnosis present

## 2024-08-12 DIAGNOSIS — Z96652 Presence of left artificial knee joint: Secondary | ICD-10-CM

## 2024-08-12 NOTE — Progress Notes (Signed)
 Post-Op Visit Note   Patient: Alison Ward           Date of Birth: December 12, 1960           MRN: [id number] Visit Date: 08/12/2024 PCP: Delbert Clam, MD   Assessment & Plan:  Chief Complaint:  Chief Complaint  Patient presents with   Left Knee - Follow-up    Left TKA 05/26/2024   Visit Diagnoses:  1. Status post total left knee replacement     Plan: Patient is a pleasant 63 year old female who comes in today approximately 3 months status post left total knee replacement 05/26/2024.  She has been doing well.  She is not in any pain.  She has been in physical therapy making slow but steady progress.  She was unable to attend PT last week as she underwent a TEE.  Examination of her left knee reveals a fair amount of swelling.  Range of motion from about 5 to 95 degrees.  She is stable to valgus and varus stress.  She is neurovascularly intact distally.  At this point, have encouraged her to continue with PT as well as a home exercise program.  I think the swelling is contributing to her lack of flexion at this time.  Dental prophylaxis reinforced.  Follow-up in 3 months when she is 6 months out from surgery for repeat evaluation and 2 view x-rays of the left knee.  Follow-Up Instructions: Return in about 3 months (around 11/12/2024).   Orders:  No orders of the defined types were placed in this encounter.  No orders of the defined types were placed in this encounter.   Imaging: No new imaging  PMFS History: Patient Active Problem List   Diagnosis Date Noted   Severe mitral regurgitation 08/05/2024   Status post total left knee replacement 05/26/2024   Acute pain of right knee 05/23/2023   Murmur, cardiac 01/24/2023   Primary osteoarthritis of left knee 05/19/2022   Lumbar radiculopathy 03/11/2018   Ganglion cyst of right foot 07/04/2017   Chronic tension-type headache, not intractable 12/30/2015   Post-operative state 12/15/2015   Simple partial seizure disorder  (HCC) 11/04/2015   Left sided numbness 09/25/2015   Leukopenia 09/25/2015   Tension vascular headache 09/07/2015   Meralgia paresthetica of left side 09/07/2015   Fainting spell 09/07/2015   Prediabetes 09/02/2015   Stye 08/06/2014   Essential hypertension, benign 04/16/2014   CVA (cerebral vascular accident) (HCC) 04/16/2014   Headache 04/16/2014   Tension headache 03/06/2014   Obesity, morbid (HCC) 08/13/2013   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   ICH (intracerebral hemorrhage) (HCC) 07/21/2013   Hypokalemia 07/21/2013   Intracerebral hemorrhage (HCC) 07/06/2013   Essential hypertension 02/24/2009   Past Medical History:  Diagnosis Date   Arthritis    Chronic headaches    daily, bitemporal, associated with tingling in face   Heart murmur    Hypertension    Mitral regurgitation    Pre-diabetes    Seizures (HCC)    partial seizures last one on 10/22/15  On Keppra  currently - last seizure 2021   Stroke (HCC) 07/05/2013   Tremor    in hands, more in right    Family History  Problem Relation Age of Onset   Hypertension Mother    Diabetes Mother    Stroke Mother    Hypertension Father    Colon cancer Neg Hx    Breast cancer Neg Hx    Colon polyps Neg Hx  Esophageal cancer Neg Hx    Rectal cancer Neg Hx    Stomach cancer Neg Hx     Past Surgical History:  Procedure Laterality Date   ABDOMINAL HYSTERECTOMY N/A 12/15/2015   Procedure: HYSTERECTOMY ABDOMINAL;  Surgeon: Harland JAYSON Birkenhead, MD;  Location: [facility];  Service: Gynecology;  Laterality: N/A;   CESAREAN SECTION     COLONOSCOPY     POLYPECTOMY     SALPINGOOPHORECTOMY Bilateral 12/15/2015   Procedure: SALPINGO OOPHORECTOMY;  Surgeon: Harland JAYSON Birkenhead, MD;  Location: [facility];  Service: Gynecology;  Laterality: Bilateral;   TOTAL KNEE ARTHROPLASTY Left 05/26/2024   Procedure: LEFT TOTAL KNEE ARTHROPLASTY;  Surgeon: Jerri Kay HERO, MD;  Location: [facility];  Service: Orthopedics;  Laterality: Left;    TRANSESOPHAGEAL ECHOCARDIOGRAM (CATH LAB) N/A 08/05/2024   Procedure: TRANSESOPHAGEAL ECHOCARDIOGRAM;  Surgeon: Kate Lonni CROME, MD;  Location: [facility] INVASIVE CV LAB;  Service: Cardiovascular;  Laterality: N/A;   VENTRICULOSTOMY Left 07/07/2013   Procedure: VENTRICULOSTOMY;  Surgeon: Reyes JONETTA Budge, MD;  Location: [facility];  Service: Neurosurgery;  Laterality: Left;  Left Ventriculostomy and Removal of Right Vetriculostomy   Social History   Occupational History   Occupation: [employer]  Tobacco Use   Smoking status: Never   Smokeless tobacco: Never  Vaping Use   Vaping status: Never Used  Substance and Sexual Activity   Alcohol use: No   Drug use: No   Sexual activity: Not Currently    Birth control/protection: Surgical

## 2024-08-12 NOTE — Therapy (Signed)
 OUTPATIENT OCCUPATIONAL THERAPY NEURO  Treatment Note  Patient Name: Alison Ward MRN: [id number] DOB:1961-02-17, 63 y.o., female Today's Date: 08/12/2024  PCP: Delbert Clam, MD REFERRING PROVIDER: Delbert Clam, MD  END OF SESSION:  OT End of Session - 08/12/24 1559     Visit Number 2    Number of Visits 10    Date for Recertification  09/12/24    Authorization Type Medicare Part A&B / [place] Medicaid 2025    OT Start Time 1448    OT Stop Time 1530    OT Time Calculation (min) 42 min           Past Medical History:  Diagnosis Date   Arthritis    Chronic headaches    daily, bitemporal, associated with tingling in face   Heart murmur    Hypertension    Mitral regurgitation    Pre-diabetes    Seizures (HCC)    partial seizures last one on 10/22/15  On Keppra  currently - last seizure 2021   Stroke (HCC) 07/05/2013   Tremor    in hands, more in right   Past Surgical History:  Procedure Laterality Date   ABDOMINAL HYSTERECTOMY N/A 12/15/2015   Procedure: HYSTERECTOMY ABDOMINAL;  Surgeon: Harland JAYSON Birkenhead, MD;  Location: [facility];  Service: Gynecology;  Laterality: N/A;   CESAREAN SECTION     COLONOSCOPY     POLYPECTOMY     SALPINGOOPHORECTOMY Bilateral 12/15/2015   Procedure: SALPINGO OOPHORECTOMY;  Surgeon: Harland JAYSON Birkenhead, MD;  Location: [facility];  Service: Gynecology;  Laterality: Bilateral;   TOTAL KNEE ARTHROPLASTY Left 05/26/2024   Procedure: LEFT TOTAL KNEE ARTHROPLASTY;  Surgeon: Jerri Kay HERO, MD;  Location: [facility];  Service: Orthopedics;  Laterality: Left;   TRANSESOPHAGEAL ECHOCARDIOGRAM (CATH LAB) N/A 08/05/2024   Procedure: TRANSESOPHAGEAL ECHOCARDIOGRAM;  Surgeon: Kate Lonni CROME, MD;  Location: [facility]VASIVE CV LAB;  Service: Cardiovascular;  Laterality: N/A;   VENTRICULOSTOMY Left 07/07/2013   Procedure: VENTRICULOSTOMY;  Surgeon: Reyes JONETTA Budge, MD;  Location: [facility];  Service: Neurosurgery;  Laterality: Left;  Left Ventriculostomy and  Removal of Right Vetriculostomy   Patient Active Problem List   Diagnosis Date Noted   Severe mitral regurgitation 08/05/2024   Status post total left knee replacement 05/26/2024   Acute pain of right knee 05/23/2023   Murmur, cardiac 01/24/2023   Primary osteoarthritis of left knee 05/19/2022   Lumbar radiculopathy 03/11/2018   Ganglion cyst of right foot 07/04/2017   Chronic tension-type headache, not intractable 12/30/2015   Post-operative state 12/15/2015   Simple partial seizure disorder (HCC) 11/04/2015   Left sided numbness 09/25/2015   Leukopenia 09/25/2015   Tension vascular headache 09/07/2015   Meralgia paresthetica of left side 09/07/2015   Fainting spell 09/07/2015   Prediabetes 09/02/2015   Stye 08/06/2014   Essential hypertension, benign 04/16/2014   CVA (cerebral vascular accident) (HCC) 04/16/2014   Headache 04/16/2014   Tension headache 03/06/2014   Obesity, morbid (HCC) 08/13/2013   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   ICH (intracerebral hemorrhage) (HCC) 07/21/2013   Hypokalemia 07/21/2013   Intracerebral hemorrhage (HCC) 07/06/2013   Essential hypertension 02/24/2009    ONSET DATE: referral date 06/27/24  REFERRING DIAG: R25.1 (ICD-10-CM) - Tremor  THERAPY DIAG:  Tremor  Other lack of coordination  Rationale for Evaluation and Treatment: Rehabilitation  SUBJECTIVE:   SUBJECTIVE STATEMENT: Pt reports that she has had a lot of medical appts recently, reporting that he knee is really improving with  PT.  She had a f/u with the knee surgeon, reporting that things are looking good.    Pt recognizing improvements in tremors, noting improvements in eating with decreased spilling of foods.  Per chart review She experiences significant tremors and fatigue since her knee replacement surgery. The tremors interfere with daily activities, including eating, drinking, and dressing, and require assistance for tasks like using the microwave.  Tremors worsen with fatigue and improve with rest. Pt accompanied by: self (friend brought pt to appt)  PERTINENT HISTORY: history of hypertension, chronic headaches, previous history of right  Hemorrhagic stroke status post ventriculostomy in 06/2013, Prediabetes, meralgia paresthetica, hyperlipidemia; post- surgical tremors s/p knee surgery  PRECAUTIONS: None  WEIGHT BEARING RESTRICTIONS: No  PAIN:  Are you having pain? No  FALLS: Has patient fallen in last 6 months? No  LIVING ENVIRONMENT: Lives with: lives with their family (adult dtr and 2 granddaughters) Lives in: House/apartment Stairs: 2 floor apt, full flight of steps with rails on B sides Has following equipment at home: Walker - 2 wheeled - not using it anymore  PLOF: Independent and Independent with basic ADLs  PATIENT GOALS: to learn strategies to increase ease and control of hand movements  OBJECTIVE:  Note: Objective measures were completed at Evaluation unless otherwise noted.  HAND DOMINANCE: Right  ADLs: Transfers/ambulation related to ADLs: initially using RW after knee surgery, no longer using Eating: difficulty with bringing cup to mouth (spilling), therefore using straw in cups and using a lot more hand held foods to decrease spillage UB Dressing: reports needing assistance to fasten buttons LB Dressing: Independent Toileting:  Independent Bathing:  Independent Tub Shower transfers:  Independent Equipment: none  IADLs: Light housekeeping: folding laundry, makes the bed and changes her sheets but it takes more effort than prior to surgery Meal Prep: daughter has been doing more of the cooking since pt had surgery, pt reports that she plans to try cooking today to see how it goes Community mobility: pt no longer drives as she reports having h/o brain surgery and seizures therefore no longer drives, does use Medicaid transportation and/or family/friends Medication management: reports that she recently  started using pill box for recall of meds Handwriting: 30.53 seconds [place];  Moderate changes in quality of letter formation due to tremors  MOBILITY STATUS: slower due to recent knee surgery  POSTURE COMMENTS:  No Significant postural limitations  ACTIVITY TOLERANCE: Activity tolerance: diminished, reports fatiguing more quickly than prior to surgery  FUNCTIONAL OUTCOME MEASURES: Physical performance test: Self-feeding: 44.47 sec, tremors increased with activity, dropping 1 bean* 3 button/unbutton: 46.90 sec  UPPER EXTREMITY ROM:    Active ROM Right eval Left eval  Shoulder flexion [facility]  Shoulder abduction [facility]  Shoulder adduction    Shoulder extension    Shoulder internal rotation [facility]  Shoulder external rotation [facility] WFL  Elbow flexion WFL WFL  Elbow extension [facility] WFL  Wrist flexion    Wrist extension    Wrist ulnar deviation    Wrist radial deviation    Wrist pronation    Wrist supination    (Blank rows = not tested)  UPPER EXTREMITY MMT:     MMT Right eval Left eval  Shoulder flexion 4 4  Shoulder abduction    Shoulder adduction    Shoulder extension    Shoulder internal rotation    Shoulder external rotation    Middle trapezius    Lower trapezius    Elbow  flexion 4- (increased tremor) 4- (increased tremor)  Elbow extension 4- (increased tremor) 4- (increased tremor)  Wrist flexion    Wrist extension    Wrist ulnar deviation    Wrist radial deviation    Wrist pronation    Wrist supination    (Blank rows = not tested)  HAND FUNCTION: TBD  COORDINATION: 9 Hole Peg test: Right: 46.19 sec; Left: 63.56 sec Box and Blocks:  TBD Tremors: action, Right, Left, and increased tremors L > R with peg test  SENSATION: WFL  COGNITION: Overall cognitive status: Within functional limits for tasks assessed  VISION: Subjective report: no changes Baseline vision: Wears glasses all the time   OBSERVATIONS: Pt demonstrating  tremors at rest and with activity, noting increased tremors L > R in hand and forearm with fine motor tasks.                                                                                                                             TREATMENT DATE:  08/12/24 Reassessed goals, see goals for detailed measurements. Discussed with pt noted improvements and areas of opportunity. Pt expressing desire to attempt use of built up foam handle on writing and eating utensils and to either make or purchased weighted utensils as pt with improvements in ease of self-feeding and handwriting with weighted utensils. Self-feeding: 50.5 second standard spoon, 27.37 sec with large grip spoon, and 19.72 sec with foam handle and added weight. OT provided pt with built up foam to apply to self-feeding utensils and educated on how to adapt for weighted utensil.  OT also providing pt with picture of weighted utensils she could purchase. Handwriting: pt writing name and sentence with built up foam handle, weighted pen with built up handle, and min weighted pen.  Pt benefiting from min-mod weight on pen, resulting in increased legibility and speed.    07/03/24 Educated on strategies to reduce action tremor, provided handout, and educated on recommendation to reflect on fatigue and stress and how they impact her tremors, provided with handout.        PATIENT EDUCATION: Education details: weighted utensils for handwriting and self-feeding Person educated: Patient Education method: Chief Technology Officer Education comprehension: verbalized understanding and needs further education  HOME EXERCISE PROGRAM: TBD   GOALS: Goals reviewed with patient? Yes  SHORT TERM GOALS: Target date: 07/24/24  Pt will be independent in coordination and strengthening HEP. Baseline: new to OPOT Goal status: INITIAL  2.  Pt will verbalize understanding of task modifications and/or adaptive strategies to compensate for tremors  during ADLs and fine motor control tasks.  Baseline: increased tremors with putting in earrings, handwriting, eating, pouring items, retrieving items from microwave, and buttoning Goal status: INITIAL  3.  Pt will verbalize understanding of energy conservation strategies.  Baseline: increased fatigue s/p surgery  Baseline: INITIAL   LONG TERM GOALS: Target date: 08/14/24  Pt will demonstrate improved ease with fastening buttons as evidenced by decreasing  3 button/ unbutton time to 40 seconds or less with use of AE and/or adaptive strategies. Baseline: 46.90 sec 08/12/24: 26.62 sec Goal status: MET  2.  Pt will demonstrate improved fine motor coordination for ADLs as evidenced by decreasing 9 hole peg test score for LUE by 12 seconds and RUE by 8 seconds. Baseline: Right: 46.19 sec; Left: 63.56 sec 08/12/24: R: 36.88 sec; L: 56.88 sec - dropping 2 pegs Goal status: Partially met (MET on RIGHT)  3.  Pt will demonstrate improved ease with feeding as evidenced by decreasing PPT#2 (self feeding) by 8 secs Baseline: 44.47 sec 08/12/24: 50.5 sec Goal status: in progress  4.  Pt will demonstrate improved legibility and ease with handwriting as evidenced by decreasing PPT #1 (handwriting) by 5 seconds. Baseline: 30.53 seconds 08/12/24: 16.85 seconds with weighted pen Goal status: MET  NEW LONG TERM GOALS: Target date: 09/12/24  1.  Pt will demonstrate improved fine motor coordination for ADLs as evidenced by decreasing 9 hole peg test score for LUE by 12 seconds and RUE by 8 seconds. Baseline: Right: 46.19 sec; Left: 63.56 sec 08/12/24: R: 36.88 sec; L: 56.88 sec - dropping 2 pegs Goal status: Partially met (MET on RIGHT)  2.  Pt will demonstrate improved ease with feeding as evidenced by decreasing PPT#2 (self feeding) by 8 secs Baseline: 44.47 sec 08/12/24: 50.5 sec Goal status: in progress  3.  Pt will verbalize understanding of task modifications and/or adaptive strategies to  compensate for tremors during ADLs and fine motor control tasks.  Baseline: increased tremors with putting in earrings, handwriting, eating, pouring items, retrieving items from microwave, and buttoning Goal status: in progress  ASSESSMENT:  CLINICAL IMPRESSION: Patient is a 63 y.o. female who was seen today for occupational therapy evaluation for onset of tremors in BUE s/p knee surgery.  Pt returning for first visit s/p initial evaluation 07/03/24, due to issues with transportation and other medical appts.  Pt reporting and demonstrating improvements in quality of movement.  Pt to attempt use of built up foam handle on writing and eating utensils and to either make or purchased weighted utensils as pt with improvements in ease of self-feeding and handwriting with weighted utensils. Pt will return in 2-3 weeks for additional assessment and f/u as needed. Pt will benefit from additional skilled occupational therapy services to address strength and coordination,GM/FM control, introduction of compensatory strategies/AE prn, and implementation of an HEP to improve participation and safety during ADLs and IADLs.    PERFORMANCE DEFICITS: in functional skills including ADLs, IADLs, coordination, strength, Fine motor control, Gross motor control, endurance, decreased knowledge of use of DME, and UE functional use and psychosocial skills including environmental adaptation and routines and behaviors.     PLAN:  OT FREQUENCY: 1-2x/week  OT DURATION: 6 weeks  PLANNED INTERVENTIONS: 97168 OT Re-evaluation, 97535 self care/ADL training, 02889 therapeutic exercise, 97530 therapeutic activity, 97112 neuromuscular re-education, functional mobility training, psychosocial skills training, energy conservation, coping strategies training, patient/family education, and DME and/or AE instructions  RECOMMENDED OTHER SERVICES: NA  CONSULTED AND AGREED WITH PLAN OF CARE: Patient  PLAN FOR NEXT SESSION: pour cup of  water, carry items, educate on proximity of arms to body, WB, initiate coordination and/or theraband HEP   Juaquina Machnik, OTR/L 08/12/2024, 4:00 PM  [facility] [address], [address] [place], [place] [address] Phone # [phone] Fax # [phone]

## 2024-08-14 ENCOUNTER — Ambulatory Visit: Admitting: Physical Therapy

## 2024-08-14 DIAGNOSIS — M25662 Stiffness of left knee, not elsewhere classified: Secondary | ICD-10-CM

## 2024-08-14 DIAGNOSIS — M25562 Pain in left knee: Secondary | ICD-10-CM

## 2024-08-14 DIAGNOSIS — M6281 Muscle weakness (generalized): Secondary | ICD-10-CM

## 2024-08-14 NOTE — Therapy (Signed)
 OUTPATIENT PHYSICAL THERAPY TREATMENT NOTE   Patient Name: Alison Ward MRN: [id number] DOB:19-May-1961, 63 y.o., female Today's Date: 08/14/2024  END OF SESSION:  PT End of Session - 08/14/24 1232     Visit Number 4    Date for Recertification  09/11/24    Authorization Type Medicare/ Medicaid    PT Start Time 1232    PT Stop Time 1315    PT Time Calculation (min) 43 min    Activity Tolerance Patient tolerated treatment well          Past Medical History:  Diagnosis Date   Arthritis    Chronic headaches    daily, bitemporal, associated with tingling in face   Heart murmur    Hypertension    Mitral regurgitation    Pre-diabetes    Seizures (HCC)    partial seizures last one on 10/22/15  On Keppra  currently - last seizure 2021   Stroke (HCC) 07/05/2013   Tremor    in hands, more in right   Past Surgical History:  Procedure Laterality Date   ABDOMINAL HYSTERECTOMY N/A 12/15/2015   Procedure: HYSTERECTOMY ABDOMINAL;  Surgeon: Harland JAYSON Birkenhead, MD;  Location: [facility];  Service: Gynecology;  Laterality: N/A;   CESAREAN SECTION     COLONOSCOPY     POLYPECTOMY     SALPINGOOPHORECTOMY Bilateral 12/15/2015   Procedure: SALPINGO OOPHORECTOMY;  Surgeon: Harland JAYSON Birkenhead, MD;  Location: [facility];  Service: Gynecology;  Laterality: Bilateral;   TOTAL KNEE ARTHROPLASTY Left 05/26/2024   Procedure: LEFT TOTAL KNEE ARTHROPLASTY;  Surgeon: Jerri Kay HERO, MD;  Location: [facility];  Service: Orthopedics;  Laterality: Left;   TRANSESOPHAGEAL ECHOCARDIOGRAM (CATH LAB) N/A 08/05/2024   Procedure: TRANSESOPHAGEAL ECHOCARDIOGRAM;  Surgeon: Kate Lonni CROME, MD;  Location: [facility] INVASIVE CV LAB;  Service: Cardiovascular;  Laterality: N/A;   VENTRICULOSTOMY Left 07/07/2013   Procedure: VENTRICULOSTOMY;  Surgeon: Reyes JONETTA Budge, MD;  Location: [facility];  Service: Neurosurgery;  Laterality: Left;  Left Ventriculostomy and Removal of Right Vetriculostomy   Patient Active Problem List    Diagnosis Date Noted   Severe mitral regurgitation 08/05/2024   Status post total left knee replacement 05/26/2024   Acute pain of right knee 05/23/2023   Murmur, cardiac 01/24/2023   Primary osteoarthritis of left knee 05/19/2022   Lumbar radiculopathy 03/11/2018   Ganglion cyst of right foot 07/04/2017   Chronic tension-type headache, not intractable 12/30/2015   Post-operative state 12/15/2015   Simple partial seizure disorder (HCC) 11/04/2015   Left sided numbness 09/25/2015   Leukopenia 09/25/2015   Tension vascular headache 09/07/2015   Meralgia paresthetica of left side 09/07/2015   Fainting spell 09/07/2015   Prediabetes 09/02/2015   Stye 08/06/2014   Essential hypertension, benign 04/16/2014   CVA (cerebral vascular accident) (HCC) 04/16/2014   Headache 04/16/2014   Tension headache 03/06/2014   Obesity, morbid (HCC) 08/13/2013   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   ICH (intracerebral hemorrhage) (HCC) 07/21/2013   Hypokalemia 07/21/2013   Intracerebral hemorrhage (HCC) 07/06/2013   Essential hypertension 02/24/2009    PCP: Delbert Clam MD  REFERRING PROVIDER: Jule Ronal CROME PA-C  REFERRING DIAG: [phone] s/p total left knee replacement  THERAPY DIAG:  Left knee pain; weakness; left knee stiffness  Rationale for Evaluation and Treatment: Rehabilitation  ONSET DATE: > 1 year; surgery 7/28  SUBJECTIVE:   SUBJECTIVE STATEMENT: It's doing better on the stairs.  No pain.  I need to work on bending the knee.  PERTINENT HISTORY: 05/26/24 left TKR Has a leaky valve and having a cardiac procedure to have a better view Oct 7; doing OT for hand tremors since increased since surgery History of CVA, hemorrhagic brain infarction; HTN  PAIN:   Are you having pain? No NPRS scale: 0/10 Pain location: Left knee not hurting now  Pain orientation: Right and Left  PAIN TYPE: aching Pain description: intermittent  Aggravating factors: certain  movements like getting in/out of the car Relieving factors: ice   PRECAUTIONS: None    WEIGHT BEARING RESTRICTIONS: No  FALLS:  Has patient fallen in last 6 months? No  LIVING ENVIRONMENT: Lives in: House/apartment Stairs: Yes: External: 15 steps; on right going up doing one step at a time   OCCUPATION: retired   PLOF: Independent  PATIENT GOALS: knee will be moving as it shoulder normally so not relying on right leg; turning over in bed easier while sleeping  NEXT MD VISIT: Post-Op in November  OBJECTIVE:  Note: Objective measures were completed at Evaluation unless otherwise noted.  DIAGNOSTIC FINDINGS: left knee OA  PATIENT SURVEYS:  LEFS  Extreme difficulty/unable (0), Quite a bit of difficulty (1), Moderate difficulty (2), Little difficulty (3), No difficulty (4) Survey date:    9/18  Any of your usual work, housework or school activities 2  2. Usual hobbies, recreational or sporting activities 2  3. Getting into/out of the bath 2  4. Walking between rooms 4  5. Putting on socks/shoes 2  6. Squatting  2  7. Lifting an object, like a bag of groceries from the floor 4  8. Performing light activities around your home 4  9. Performing heavy activities around your home 3  10. Getting into/out of a car 2  11. Walking 2 blocks 3  12. Walking 1 mile 1  13. Going up/down 10 stairs (1 flight) 2  14. Standing for 1 hour 3  15.  sitting for 1 hour 3  16. Running on even ground 1  17. Running on uneven ground 1  18. Making sharp turns while running fast 1  19. Hopping  1  20. Rolling over in bed 2  Score total:  45/80     COGNITION: Overall cognitive status: Within functional limits for tasks assessed     EDEMA:  Moderate peri-patellar edema  PALPATION: Decreased scar mobility; decreased patellar mobility  LOWER EXTREMITY ROM:  Active ROM Right eval Left eval Left 08/11/24 Left 10/16  Hip flexion full full    Hip extension      Hip abduction      Hip  adduction      Hip internal rotation      Hip external rotation      Knee flexion 120 93 sit/95 supine  98 100  Knee extension 0 7 sit and supine  5 4  Ankle dorsiflexion      Ankle plantarflexion      Ankle inversion      Ankle eversion       (Blank rows = not tested)  LOWER EXTREMITY MMT: + quad lag with SLR  MMT Right eval Left eval  Hip flexion 4 4  Hip extension    Hip abduction 4 4  Hip adduction    Hip internal rotation    Hip external rotation    Knee flexion 5 4-  Knee extension 5 3+  Ankle dorsiflexion    Ankle plantarflexion 4 4  Ankle inversion    Ankle eversion     (  Blank rows = not tested)   FUNCTIONAL TESTS:  Eval: Moderate use of bil Ues to rise from a standard chair 3 minute walk test: 383 feet no device  07/28/2024: Timed up and Go (TUG):  15.02 sec 5 times sit to/from stand:  19.76 sec with decreased weight through LLE  GAIT:  Comments: no device but decreased stance time on left; lacks full knee extension; decreased speed; difficulty walking with her purse on one side                                                                                                                                TREATMENT DATE:  08/14/2024: Nustep level 3 x6 min Les only with PT present to discuss status Manual therapy: seated knee flexion with manual overpressure 10x, supine knee flexion with overpressure 20x Supine green ball rolls 10x for knee flexion  Supine HS sets on ball 10x  LAQ 3# 15x Second step knee flexion stretch >100 degrees 15x, discussed doing this at home every 2-3 hours 6 inch step ups 10x single arm support WB on left with right step taps 10x no UE support Leg press seat 8 60# 10x 2 bil  Tall hurdles working on knee flexion 3 laps light UE support WB on left: right 4 ways 10x lessening UE reliance     08/11/2024: Nustep level 4 x6 min with PT present to discuss status Seated long arc quad 2x10 left LE (with 2 sec hold) Seated heel  slide with left foot on slider 2x10 Seated hamstring stretch 2x20 sec bilat Sit to/from stand x10 Sit to/from stand with right foot forward (to encourage weight-bearing through left LE) x10 Seated quad set 2x10 with left LE Standing at barre:  marching, hamstring curl, hip extension, and hip abduction.  x10 each bilat Standing left knee flexion stretch with one knee up on step 5x5-10 sec hold FWD stepping over 4 small hurdles in parallel bars (with UE support) down and back x3 laps Side stepping  over 4 small hurdles in parallel bars (with UE support) down and back x2 laps   07/28/2024: Nustep level 4 x7 min with PT present to discuss status TUG and 5 times sit to/from stand Seated long arc quad 2x10 left LE (with 2 sec hold) Seated heel slide with left foot on slider 2x10 Standing at barre:  marching, hamstring curl, and hip abduction.  2x10 each bilat Supine straight leg raise 2x10 left LE (with cuing for slow pacing) Supine bridge 2x10 Seated quad set 2x10 with left LE     PATIENT EDUCATION:  Education details: Educated patient on anatomy and physiology of current symptoms, prognosis, plan of care as well as initial self care strategies to promote recovery Person educated: Patient Education method: Explanation Education comprehension: verbalized understanding  HOME EXERCISE PROGRAM: Access Code: [email or web address] URL: [email or web address] Date: 08/11/2024 Prepared by: Jarrell Menke  Exercises - Supine Heel Slide with Strap  - 5  x daily - 7 x weekly - 1 sets - 10 reps - Supine Knee Extension Strengthening  - 1 x daily - 7 x weekly - 1 sets - 10 reps - Active Straight Leg Raise with Quad Set (Mirrored)  - 1 x daily - 7 x weekly - 2 sets - 10 reps - Seated Knee Flexion Stretch (Mirrored)  - 5 x daily - 7 x weekly - 1 sets - 10 reps - Seated Long Arc Quad  - 1 x daily - 7 x weekly - 2 sets - 10 reps - Standing March with Unilateral Counter Support  - 1 x daily - 7 x  weekly - 2 sets - 10 reps - Standing Hip Abduction with Counter Support  - 1 x daily - 7 x weekly - 2 sets - 10 reps - Standing Knee Flexion AROM with Chair Support  - 1 x daily - 7 x weekly - 2 sets - 10 reps - Standing Knee Flexion Stretch on Step  - 5 x daily - 7 x weekly - 1 sets - 10 reps - Seated Quad Set  - 1 x daily - 7 x weekly - 2 sets - 10 reps - Staggered Sit-to-Stand  - 1 x daily - 7 x weekly - 2 sets - 10 reps - Seated Hamstring Stretch  - 1 x daily - 7 x weekly - 2 reps - 20 sec hold  ASSESSMENT:  CLINICAL IMPRESSION: Treatment focus on increasing knee flexion needed for ascending and descending steps.  Good response to passive stretching on the steps and pt feels she can easily do this at home.  Able to increase resistance and challenge level of exercises.  Therapist providing verbal cues to optimize technique with exercises in order to achieve the greatest benefit.      OBJECTIVE IMPAIRMENTS: decreased activity tolerance, decreased mobility, difficulty walking, decreased ROM, decreased strength, increased edema, impaired perceived functional ability, and pain.   ACTIVITY LIMITATIONS: standing, squatting, sleeping, stairs, bed mobility, and locomotion level  PARTICIPATION LIMITATIONS: meal prep, cleaning, laundry, driving, shopping, community activity, and church  PERSONAL FACTORS: 1-2 comorbidities: HTN, prior CVA are also affecting patient's functional outcome.   REHAB POTENTIAL: Good  CLINICAL DECISION MAKING: Stable/uncomplicated  EVALUATION COMPLEXITY: Low   GOALS: Goals reviewed with patient? Yes  SHORT TERM GOALS: Target date: 08/14/2024   The patient will demonstrate knowledge of basic self care strategies and exercises to promote healing  Baseline: Goal status: met 10/16 2.   Improved knee ROM 5-105 for greater ease getting in/out of the car Baseline:  Goal status: partially met  3.  Able to rise from a standard chair with min use of UEs Baseline:   Goal status: met 10/16  4.  Improved gait speed with patient able to walk 850 feet in 6 minutes Baseline:  Goal status: INITIAL     LONG TERM GOALS: Target date: 09/11/2024   The patient will be independent in a safe self progression of a home exercise program to promote further recovery of function  Baseline:  Goal status: INITIAL  2.  The patient will report a 75% improvement in pain levels with functional activities which are currently difficult including going to church, shopping, turning over in bed Baseline:  Goal status: INITIAL  3.  The patient will have improved LE strength of at least 4+/5 needed to ascend and descend steps reciprocally  Baseline:  Goal status: INITIAL  4.  Improved ROM 3-115 degrees needed for stairs, getting  in/out of the car and for a normalized gait pattern Baseline:  Goal status: INITIAL  5.  Lower Extremity Functional Scale (LEFS) improved to    55   /80 indicating improved function with less pain Baseline:  Goal status: INITIAL    PLAN:  PT FREQUENCY: 2x/week  PT DURATION: 8 weeks  PLANNED INTERVENTIONS: 97164- PT Re-evaluation, 97110-Therapeutic exercises, 97530- Therapeutic activity, 97112- Neuromuscular re-education, 97535- Self Care, [id number]- Manual therapy, [phone]- Aquatic Therapy, [id number]- Electrical stimulation (unattended), [phone]- Electrical stimulation (manual), 97016- Vasopneumatic device, Patient/Family education, Balance training, Taping, Joint mobilization, Scar mobilization, Cryotherapy, and Moist heat  PLAN FOR NEXT SESSION: do 6 min walk test for STG;  try rocking on the bike; knee flexion and extension ROM; quad strengthening; manual therapy including scar massage and patellar mobilization; vasocompression as needed for edema control; progress HEP   Glade Pesa, PT 08/14/24 2:02 PM Phone: [phone] Fax: [phone]   [facility] [address] [place], [place]  [address] Phone # [phone] Fax [phone]

## 2024-08-18 ENCOUNTER — Ambulatory Visit: Admitting: Rehabilitative and Restorative Service Providers"

## 2024-08-21 ENCOUNTER — Ambulatory Visit: Admitting: Physical Therapy

## 2024-08-21 DIAGNOSIS — M25662 Stiffness of left knee, not elsewhere classified: Secondary | ICD-10-CM

## 2024-08-21 DIAGNOSIS — M6281 Muscle weakness (generalized): Secondary | ICD-10-CM

## 2024-08-21 DIAGNOSIS — M25562 Pain in left knee: Secondary | ICD-10-CM

## 2024-08-21 NOTE — Therapy (Signed)
 OUTPATIENT PHYSICAL THERAPY TREATMENT NOTE   Patient Name: Alison Ward MRN: [id number] DOB:[birth year], 63 y.o., female Today's Date: 08/21/2024  END OF SESSION:  PT End of Session - 08/21/24 1234     Visit Number 5    Date for Recertification  09/11/24    Authorization Type Medicare/ Medicaid    PT Start Time 1231    PT Stop Time 1315    PT Time Calculation (min) 44 min          Past Medical History:  Diagnosis Date   Arthritis    Chronic headaches    daily, bitemporal, associated with tingling in face   Heart murmur    Hypertension    Mitral regurgitation    Pre-diabetes    Seizures (HCC)    partial seizures last one on 10/22/15  On Keppra  currently - last seizure 2021   Stroke (HCC) 07/05/2013   Tremor    in hands, more in right   Past Surgical History:  Procedure Laterality Date   ABDOMINAL HYSTERECTOMY N/A 12/15/2015   Procedure: HYSTERECTOMY ABDOMINAL;  Surgeon: Harland JAYSON Birkenhead, MD;  Location: [facility];  Service: Gynecology;  Laterality: N/A;   CESAREAN SECTION     COLONOSCOPY     POLYPECTOMY     SALPINGOOPHORECTOMY Bilateral 12/15/2015   Procedure: SALPINGO OOPHORECTOMY;  Surgeon: Harland JAYSON Birkenhead, MD;  Location: [facility];  Service: Gynecology;  Laterality: Bilateral;   TOTAL KNEE ARTHROPLASTY Left 05/26/2024   Procedure: LEFT TOTAL KNEE ARTHROPLASTY;  Surgeon: Jerri Kay HERO, MD;  Location: [facility];  Service: Orthopedics;  Laterality: Left;   TRANSESOPHAGEAL ECHOCARDIOGRAM (CATH LAB) N/A 08/05/2024   Procedure: TRANSESOPHAGEAL ECHOCARDIOGRAM;  Surgeon: Kate Lonni CROME, MD;  Location: [facility] IN[facility]ASIVE CV LAB;  Service: Cardiovascular;  Laterality: N/A;   VENTRICULOSTOMY Left 07/07/2013   Procedure: VENTRICULOSTOMY;  Surgeon: Reyes JONETTA Budge, MD;  Location: [facility];  Service: Neurosurgery;  Laterality: Left;  Left Ventriculostomy and Removal of Right Vetriculostomy   Patient Active Problem List   Diagnosis Date Noted   Severe mitral regurgitation  08/05/2024   Status post total left knee replacement 05/26/2024   Acute pain of right knee 05/23/2023   Murmur, cardiac 01/24/2023   Primary osteoarthritis of left knee 05/19/2022   Lumbar radiculopathy 03/11/2018   Ganglion cyst of right foot 07/04/2017   Chronic tension-type headache, not intractable 12/30/2015   Post-operative state 12/15/2015   Simple partial seizure disorder (HCC) 11/04/2015   Left sided numbness 09/25/2015   Leukopenia 09/25/2015   Tension vascular headache 09/07/2015   Meralgia paresthetica of left side 09/07/2015   Fainting spell 09/07/2015   Prediabetes 09/02/2015   Stye 08/06/2014   Essential hypertension, benign 04/16/2014   CVA (cerebral vascular accident) (HCC) 04/16/2014   Headache 04/16/2014   Tension headache 03/06/2014   Obesity, morbid (HCC) 08/13/2013   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  The pain is not a problem.  I'm going up the stairs.     PERTINENT HISTORY: 05/26/24 left TKR  Has a leaky valve and having a cardiac procedure to have a better view Oct 7; doing OT for hand tremors since increased since surgery History of CVA, hemorrhagic brain infarction; HTN  PAIN:   Are you having pain? No NPRS scale: 0/10 Pain location: Left knee not hurting now  Pain orientation: Right and Left  PAIN TYPE: aching Pain description: intermittent  Aggravating factors: certain movements like getting in/out of the  car Relieving factors: ice   PRECAUTIONS: None    WEIGHT BEARING RESTRICTIONS: No  FALLS:  Has patient fallen in last 6 months? No  LIVING ENVIRONMENT: Lives in: House/apartment Stairs: Yes: External: 15 steps; on right going up doing one step at a time   OCCUPATION: retired   PLOF: Independent  PATIENT GOALS: knee will be moving as it shoulder normally so not relying on right leg; turning over in bed easier while sleeping  NEXT MD VISIT: Post-Op in November  OBJECTIVE:  Note: Objective measures were completed at Evaluation unless otherwise noted.  DIAGNOSTIC FINDINGS: left knee OA  PATIENT SURVEYS:  LEFS  Extreme difficulty/unable (0), Quite a bit of difficulty (1), Moderate difficulty (2), Little difficulty (3), No difficulty (4) Survey date:    9/18  Any of your usual work, housework or school activities 2  2. Usual hobbies, recreational or sporting activities 2  3. Getting into/out of the bath 2  4. Walking between rooms 4  5. Putting on socks/shoes 2  6. Squatting  2  7. Lifting an object, like a bag of groceries from the floor 4  8. Performing light activities around your home 4  9. Performing heavy activities around your home 3  10. Getting into/out of a car 2  11. Walking 2 blocks 3  12. Walking 1 mile 1  13. Going up/down 10 stairs (1 flight) 2  14. Standing for 1 hour 3  15.  sitting for 1 hour 3  16. Running on even ground 1  17. Running on uneven ground 1  18. Making sharp turns while running fast 1  19. Hopping  1  20. Rolling over in bed 2  Score total:  45/80     COGNITION: Overall cognitive status: Within functional limits for tasks assessed     EDEMA:  Moderate peri-patellar edema  PALPATION: Decreased scar mobility; decreased patellar mobility  LOWER EXTREMITY ROM:  Active ROM Right eval Left eval Left 08/11/24 Left 10/16 10/23 Left supine  Hip flexion full full     Hip extension       Hip abduction       Hip adduction        Hip internal rotation       Hip external rotation       Knee flexion 120 93 sit/95 supine  98 100 109  Knee extension 0 7 sit and supine  5 4 3   Ankle dorsiflexion       Ankle plantarflexion       Ankle inversion       Ankle eversion        (Blank rows = not tested)  LOWER EXTREMITY MMT: + quad lag with SLR  MMT Right eval Left eval  Hip flexion 4 4  Hip extension    Hip abduction 4 4  Hip adduction    Hip internal rotation    Hip external rotation    Knee flexion 5 4-  Knee extension 5 3+  Ankle dorsiflexion    Ankle plantarflexion 4 4  Ankle inversion    Ankle eversion     (Blank rows = not tested)   FUNCTIONAL TESTS:  Eval: Moderate use of bil Ues to rise from a standard chair 3 minute walk test: 383 feet no device  07/28/2024: Timed up and Go (TUG):  15.02 sec 5 times sit to/from stand:  19.76 sec with decreased weight through LLE  GAIT:  Comments: no device but decreased stance time on left; lacks full knee extension; decreased speed; difficulty walking with her purse on one side                                                                                                                                TREATMENT DATE:  08/21/2024: Nustep level 5 x5 min Les only with PT present to discuss status Bike rocking and then full revolutions 3 minutes Manual therapy: seated knee flexion with manual overpressure 10x, supine knee flexion with overpressure 20x LAQ 4# 15x Sitting on blue ball rocking forward and back and bouncing for knee flexion  6 inch step ups 10x single arm support 4 inch lateral step ups 10x  4 inch step downs 10x 2nd step knee flexion 1 minute 2nd step HS stretch 1 minute WB on left with right step taps 10x no UE support Leg press seat 8 70#  15x bil; single leg 35# left only 10x verbal cues for terminal knee extension   08/14/2024: Nustep level 3 x6 min Les only with PT present to discuss status Manual therapy: seated knee flexion with  manual overpressure 10x, supine knee flexion with overpressure 20x Supine green ball rolls 10x for knee flexion  Supine HS sets on ball 10x  LAQ 3# 15x Second step knee flexion stretch >100 degrees 15x, discussed doing this at home every 2-3 hours 6 inch step ups 10x single arm support WB on left with right step taps 10x no UE support Leg press seat 8 60# 10x 2 bil  Tall hurdles working on knee flexion 3 laps light UE support WB on left: right 4 ways 10x lessening UE reliance     08/11/2024: Nustep level 4 x6 min with PT present to discuss status Seated long arc quad 2x10 left LE (with 2 sec hold) Seated heel slide with left foot on slider 2x10 Seated hamstring stretch 2x20 sec bilat Sit to/from stand x10 Sit to/from stand with right foot forward (to encourage weight-bearing through left LE) x10 Seated quad set 2x10 with left LE Standing at barre:  marching, hamstring curl, hip extension, and hip abduction.  x10 each bilat Standing left knee flexion stretch with one knee up on step 5x5-10 sec hold FWD stepping over 4 small hurdles in parallel bars (with UE support) down and back x3 laps Side stepping  over 4 small hurdles in parallel bars (with UE support) down and back x2 laps   07/28/2024: Nustep level 4 x7 min with PT present to discuss status TUG  and 5 times sit to/from stand Seated long arc quad 2x10 left LE (with 2 sec hold) Seated heel slide with left foot on slider 2x10 Standing at barre:  marching, hamstring curl, and hip abduction.  2x10 each bilat Supine straight leg raise 2x10 left LE (with cuing for slow pacing) Supine bridge 2x10 Seated quad set 2x10 with left LE     PATIENT EDUCATION:  Education details: Educated patient on anatomy and physiology of current symptoms, prognosis, plan of care as well as initial self care strategies to promote recovery Person educated: Patient Education method: Explanation Education comprehension: verbalized  understanding  HOME EXERCISE PROGRAM: Access Code: [email or web address] URL: [email or web address] Date: 08/11/2024 Prepared by: Jarrell Menke  Exercises - Supine Heel Slide with Strap  - 5 x daily - 7 x weekly - 1 sets - 10 reps - Supine Knee Extension Strengthening  - 1 x daily - 7 x weekly - 1 sets - 10 reps - Active Straight Leg Raise with Quad Set (Mirrored)  - 1 x daily - 7 x weekly - 2 sets - 10 reps - Seated Knee Flexion Stretch (Mirrored)  - 5 x daily - 7 x weekly - 1 sets - 10 reps - Seated Long Arc Quad  - 1 x daily - 7 x weekly - 2 sets - 10 reps - Standing March with Unilateral Counter Support  - 1 x daily - 7 x weekly - 2 sets - 10 reps - Standing Hip Abduction with Counter Support  - 1 x daily - 7 x weekly - 2 sets - 10 reps - Standing Knee Flexion AROM with Chair Support  - 1 x daily - 7 x weekly - 2 sets - 10 reps - Standing Knee Flexion Stretch on Step  - 5 x daily - 7 x weekly - 1 sets - 10 reps - Seated Quad Set  - 1 x daily - 7 x weekly - 2 sets - 10 reps - Staggered Sit-to-Stand  - 1 x daily - 7 x weekly - 2 sets - 10 reps - Seated Hamstring Stretch  - 1 x daily - 7 x weekly - 2 reps - 20 sec hold  ASSESSMENT:  CLINICAL IMPRESSION: Steadily improving knee flexion and extension ROM.  Moderate peripatellar swelling present therefore encouraged ice/elevation at home which should further help with ROM and quad activation.  Eccentric quad motor control is decreased which affects her ability to descend stairs. Therapist providing verbal cues to optimize technique with  exercises in order to achieve the greatest benefit.       OBJECTIVE IMPAIRMENTS: decreased activity tolerance, decreased mobility, difficulty walking, decreased ROM, decreased strength, increased edema, impaired perceived functional ability, and pain.   ACTIVITY LIMITATIONS: standing, squatting, sleeping, stairs, bed mobility, and locomotion level  PARTICIPATION LIMITATIONS: meal prep, cleaning,  laundry, driving, shopping, community activity, and church  PERSONAL FACTORS: 1-2 comorbidities: HTN, prior CVA are also affecting patient's functional outcome.   REHAB POTENTIAL: Good  CLINICAL DECISION MAKING: Stable/uncomplicated  EVALUATION COMPLEXITY: Low   GOALS: Goals reviewed with patient? Yes  SHORT TERM GOALS: Target date: 08/14/2024   The patient will demonstrate knowledge of basic self care strategies and exercises to promote healing  Baseline: Goal status: met 10/16 2.   Improved knee ROM 5-105 for greater ease getting in/out of the car Baseline:  Goal status: partially met  3.  Able to rise from a standard chair with min use of UEs Baseline:  Goal status: met 10/16  4.  Improved gait speed with patient able to walk 850 feet in 6 minutes Baseline:  Goal status: INITIAL     LONG TERM GOALS: Target date: 09/11/2024   The patient will be independent in a safe self progression of a home exercise program to promote further recovery of function  Baseline:  Goal status: INITIAL  2.  The patient will report a 75% improvement in pain levels with functional activities which are currently difficult including going to church, shopping, turning over in bed Baseline:  Goal status: INITIAL  3.  The patient will have improved LE strength of at least 4+/5 needed to ascend and descend steps reciprocally  Baseline:  Goal status: INITIAL  4.  Improved ROM 3-115 degrees needed for stairs, getting in/out of the car and for a normalized gait pattern Baseline:  Goal status: INITIAL  5.  Lower Extremity Functional Scale (LEFS) improved to    55   /80 indicating improved function with less pain Baseline:  Goal status: INITIAL    PLAN:  PT FREQUENCY: 2x/week  PT DURATION: 8 weeks  PLANNED INTERVENTIONS: 97164- PT Re-evaluation, 97110-Therapeutic exercises, 97530- Therapeutic activity, 97112- Neuromuscular re-education, 97535- Self Care, [id number]- Manual therapy,  [phone]- Aquatic Therapy, [id number]- Electrical stimulation (unattended), [phone]- Electrical stimulation (manual), 97016- Vasopneumatic device, Patient/Family education, Balance training, Taping, Joint mobilization, Scar mobilization, Cryotherapy, and Moist heat  PLAN FOR NEXT SESSION: do 6 min walk test for STG;  try rocking on the bike; knee flexion and extension ROM; quad strengthening; manual therapy including scar massage and patellar mobilization; vasocompression as needed for edema control; progress HEP    Glade Pesa, PT 08/21/24 9:44 PM Phone: [phone] Fax: [phone]   [facility] [address], [address] [place], [place] [address] Phone # [phone] Fax [phone]

## 2024-08-25 ENCOUNTER — Ambulatory Visit: Admitting: Rehabilitative and Restorative Service Providers"

## 2024-08-28 ENCOUNTER — Encounter: Payer: Self-pay | Admitting: Physical Therapy

## 2024-08-28 ENCOUNTER — Ambulatory Visit: Admitting: Physical Therapy

## 2024-08-28 DIAGNOSIS — M25662 Stiffness of left knee, not elsewhere classified: Secondary | ICD-10-CM

## 2024-08-28 DIAGNOSIS — M6281 Muscle weakness (generalized): Secondary | ICD-10-CM

## 2024-08-28 DIAGNOSIS — M25562 Pain in left knee: Secondary | ICD-10-CM

## 2024-08-28 NOTE — Therapy (Signed)
 OUTPATIENT PHYSICAL THERAPY TREATMENT NOTE   Patient Name: Alison Ward MRN: [id number] DOB:[birth year], 63 y.o., female Today's Date: 08/28/2024  END OF SESSION:  PT End of Session - 08/28/24 1451     Visit Number 6    Date for Recertification  09/11/24    Authorization Type Medicare/ Medicaid    PT Start Time 1447    PT Stop Time 1528    PT Time Calculation (min) 41 min    Activity Tolerance Patient tolerated treatment well          Past Medical History:  Diagnosis Date   Arthritis    Chronic headaches    daily, bitemporal, associated with tingling in face   Heart murmur    Hypertension    Mitral regurgitation    Pre-diabetes    Seizures (HCC)    partial seizures last one on 10/22/15  On Keppra  currently - last seizure 2021   Stroke (HCC) 07/05/2013   Tremor    in hands, more in right   Past Surgical History:  Procedure Laterality Date   ABDOMINAL HYSTERECTOMY N/A 12/15/2015   Procedure: HYSTERECTOMY ABDOMINAL;  Surgeon: Harland JAYSON Birkenhead, MD;  Location: [facility];  Service: Gynecology;  Laterality: N/A;   CESAREAN SECTION     COLONOSCOPY     POLYPECTOMY     SALPINGOOPHORECTOMY Bilateral 12/15/2015   Procedure: SALPINGO OOPHORECTOMY;  Surgeon: Harland JAYSON Birkenhead, MD;  Location: [facility];  Service: Gynecology;  Laterality: Bilateral;   TOTAL KNEE ARTHROPLASTY Left 05/26/2024   Procedure: LEFT TOTAL KNEE ARTHROPLASTY;  Surgeon: Jerri Kay HERO, MD;  Location: [facility];  Service: Orthopedics;  Laterality: Left;   TRANSESOPHAGEAL ECHOCARDIOGRAM (CATH LAB) N/A 08/05/2024   Procedure: TRANSESOPHAGEAL ECHOCARDIOGRAM;  Surgeon: Kate Lonni CROME, MD;  Location: [facility] INVASIVE CV LAB;  Service: Cardiovascular;  Laterality: N/A;   VENTRICULOSTOMY Left 07/07/2013   Procedure: VENTRICULOSTOMY;  Surgeon: Reyes JONETTA Budge, MD;  Location: [facility];  Service: Neurosurgery;  Laterality: Left;  Left Ventriculostomy and Removal of Right Vetriculostomy   Patient Active Problem List    Diagnosis Date Noted   Severe mitral regurgitation 08/05/2024   Status post total left knee replacement 05/26/2024   Acute pain of right knee 05/23/2023   Murmur, cardiac 01/24/2023   Primary osteoarthritis of left knee 05/19/2022   Lumbar radiculopathy 03/11/2018   Ganglion cyst of right foot 07/04/2017   Chronic tension-type headache, not intractable 12/30/2015   Post-operative state 12/15/2015   Simple partial seizure disorder (HCC) 11/04/2015   Left sided numbness 09/25/2015   Leukopenia 09/25/2015   Tension vascular headache 09/07/2015   Meralgia paresthetica of left side 09/07/2015   Fainting spell 09/07/2015   Prediabetes 09/02/2015   Stye 08/06/2014   Essential hypertension, benign 04/16/2014   CVA (cerebral vascular accident) (HCC) 04/16/2014   Headache 04/16/2014   Tension headache 03/06/2014   Obesity, morbid (HCC) 08/13/2013   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  I don't have the pains.  Stairs are easier.    PERTINENT HISTORY:  05/26/24 left TKR Has a leaky valve and having a cardiac procedure to have a better view Oct 7; doing OT for hand tremors since increased since surgery History of CVA, hemorrhagic brain infarction; HTN  PAIN:   Are you having pain? No NPRS scale: 0/10 Pain location: Left knee  Pain orientation: Right and Left  PAIN TYPE: aching Pain description: intermittent  Aggravating factors: certain movements like getting in/out of the  car Relieving factors: ice   PRECAUTIONS: None    WEIGHT BEARING RESTRICTIONS: No  FALLS:  Has patient fallen in last 6 months? No  LIVING ENVIRONMENT: Lives in: House/apartment Stairs: Yes: External: 15 steps; on right going up doing one step at a time   OCCUPATION: retired   PLOF: Independent  PATIENT GOALS: knee will be moving as it shoulder normally so not relying on right leg; turning over in bed easier while sleeping  NEXT MD VISIT: Post-Op in November  OBJECTIVE:  Note: Objective measures were completed at Evaluation unless otherwise noted.  DIAGNOSTIC FINDINGS: left knee OA  PATIENT SURVEYS:  LEFS  Extreme difficulty/unable (0), Quite a bit of difficulty (1), Moderate difficulty (2), Little difficulty (3), No difficulty (4) Survey date:    9/18  Any of your usual work, housework or school activities 2  2. Usual hobbies, recreational or sporting activities 2  3. Getting into/out of the bath 2  4. Walking between rooms 4  5. Putting on socks/shoes 2  6. Squatting  2  7. Lifting an object, like a bag of groceries from the floor 4  8. Performing light activities around your home 4  9. Performing heavy activities around your home 3  10. Getting into/out of a car 2  11. Walking 2 blocks 3  12. Walking 1 mile 1  13. Going up/down 10 stairs (1 flight) 2  14. Standing for 1 hour 3  15.  sitting for 1 hour 3  16. Running on even ground 1  17. Running on uneven ground 1  18. Making sharp turns while running fast 1  19. Hopping  1  20. Rolling over in bed 2  Score total:  45/80     COGNITION: Overall cognitive status: Within functional limits for tasks assessed     EDEMA:  Moderate peri-patellar edema  PALPATION: Decreased scar mobility; decreased patellar mobility  LOWER EXTREMITY ROM:  Active ROM Right eval Left eval Left 08/11/24 Left 10/16 10/23 Left supine 10/30  Hip flexion full full      Hip extension        Hip abduction        Hip  adduction        Hip internal rotation        Hip external rotation        Knee flexion 120 93 sit/95 supine  98 100 109 114  Knee extension 0 7 sit and supine  5 4 3 3   Ankle dorsiflexion        Ankle plantarflexion        Ankle inversion        Ankle eversion         (Blank rows = not tested)  LOWER EXTREMITY MMT: + quad lag with SLR  MMT Right eval Left eval 10/30  Hip flexion 4 4 5   Hip extension     Hip abduction 4 4 4+  Hip adduction     Hip internal rotation     Hip external rotation  Knee flexion 5 4- 4  Knee extension 5 3+ 4-  Ankle dorsiflexion     Ankle plantarflexion 4 4 5   Ankle inversion     Ankle eversion      (Blank rows = not tested)   FUNCTIONAL TESTS:  Eval: Moderate use of bil Ues to rise from a standard chair 3 minute walk test: 383 feet no device  07/28/2024: Timed up and Go (TUG):  15.02 sec 5 times sit to/from stand:  19.76 sec with decreased weight through LLE  GAIT:  Comments: no device but decreased stance time on left; lacks full knee extension; decreased speed; difficulty walking with her purse on one side                                                                                                                                TREATMENT DATE:  08/28/2024: Bike rocking and then full revolutions 5 minutes Manual therapy: seated knee flexion with manual overpressure 10x, supine knee flexion with overpressure 20x LAQ 5# 10x 2 Seated green band HS curls 10x 6 inch step ups 10x single arm support Attempted 4 inch step up and overs 2x with turn around although discontinued due to dizziness  4 inch step downs 10x Resisted backward walk 10# with belt 10x  Leg press seat 8 75#  10x 2 bil; single leg 35# left only 10x 2verbal cues for terminal knee extension   08/21/2024: Nustep level 5 x5 min Les only with PT present to discuss status Bike rocking and then full revolutions 3 minutes Manual therapy: seated knee flexion with manual  overpressure 10x, supine knee flexion with overpressure 20x LAQ 4# 15x Sitting on blue ball rocking forward and back and bouncing for knee flexion  6 inch step ups 10x single arm support 4 inch lateral step ups 10x  4 inch step downs 10x 2nd step knee flexion 1 minute 2nd step HS stretch 1 minute WB on left with right step taps 10x no UE support Leg press seat 8 70#  15x bil; single leg 35# left only 10x verbal cues for terminal knee extension   08/14/2024: Nustep level 3 x6 min Les only with PT present to discuss status Manual therapy: seated knee flexion with manual overpressure 10x, supine knee flexion with overpressure 20x Supine green ball rolls 10x for knee flexion  Supine HS sets on ball 10x  LAQ 3# 15x Second step knee flexion stretch >100 degrees 15x, discussed doing this at home every 2-3 hours 6 inch step ups 10x single arm support WB on left with right step taps 10x no UE support Leg press seat 8 60# 10x 2 bil  Tall hurdles working on knee flexion 3 laps light UE support WB on left: right 4 ways 10x lessening UE reliance     08/11/2024: Nustep level 4 x6 min with PT present to discuss status Seated long arc quad 2x10 left LE (with 2 sec  hold) Seated heel slide with left foot on slider 2x10 Seated hamstring stretch 2x20 sec bilat Sit to/from stand x10 Sit to/from stand with right foot forward (to encourage weight-bearing through left LE) x10 Seated quad set 2x10 with left LE Standing at barre:  marching, hamstring curl, hip extension, and hip abduction.  x10 each bilat Standing left knee flexion stretch with one knee up on step 5x5-10 sec hold FWD stepping over 4 small hurdles in parallel bars (with UE support) down and back x3 laps Side stepping  over 4 small hurdles in parallel bars (with UE support) down and back x2 laps   07/28/2024: Nustep level 4 x7 min with PT present to discuss status TUG and 5 times sit to/from stand Seated long arc quad 2x10 left LE  (with 2 sec hold) Seated heel slide with left foot on slider 2x10 Standing at barre:  marching, hamstring curl, and hip abduction.  2x10 each bilat Supine straight leg raise 2x10 left LE (with cuing for slow pacing) Supine bridge 2x10 Seated quad set 2x10 with left LE     PATIENT EDUCATION:  Education details: Educated patient on anatomy and physiology of current symptoms, prognosis, plan of care as well as initial self care strategies to promote recovery Person educated: Patient Education method: Explanation Education comprehension: verbalized understanding  HOME EXERCISE PROGRAM: Access Code: [email or web address] URL: [email or web address] Date: 08/11/2024 Prepared by: Jarrell Menke  Exercises - Supine Heel Slide with Strap  - 5 x daily - 7 x weekly - 1 sets - 10 reps - Supine Knee Extension Strengthening  - 1 x daily - 7 x weekly - 1 sets - 10 reps - Active Straight Leg Raise with Quad Set (Mirrored)  - 1 x daily - 7 x weekly - 2 sets - 10 reps - Seated Knee Flexion Stretch (Mirrored)  - 5 x daily - 7 x weekly - 1 sets - 10 reps - Seated Long Arc Quad  - 1 x daily - 7 x weekly - 2 sets - 10 reps - Standing March with Unilateral Counter Support  - 1 x daily - 7 x weekly - 2 sets - 10 reps - Standing Hip Abduction with Counter Support  - 1 x daily - 7 x weekly - 2 sets - 10 reps - Standing Knee Flexion AROM with Chair Support  - 1 x daily - 7 x weekly - 2 sets - 10 reps - Standing Knee Flexion Stretch on Step  - 5 x daily - 7 x weekly - 1 sets - 10 reps - Seated Quad Set  - 1 x daily - 7 x weekly - 2 sets - 10 reps - Staggered Sit-to-Stand  - 1 x daily - 7 x weekly - 2 sets - 10 reps - Seated Hamstring Stretch  - 1 x daily - 7 x weekly - 2 reps - 20 sec hold  ASSESSMENT:  CLINICAL IMPRESSION: Steady improvement with knee flexion ROM.  Quad motor control continues to improve with eccentric movements. Able to increase number of repetitions or resistance with several of the  exercises today.  Modification to avoid too many turns with step up and overs secondary to dizziness.  Fewer verbal cues needed for endrange extension.      OBJECTIVE IMPAIRMENTS: decreased activity tolerance, decreased mobility, difficulty walking, decreased ROM, decreased strength, increased edema, impaired perceived functional ability, and pain.   ACTIVITY LIMITATIONS: standing, squatting, sleeping, stairs, bed mobility, and locomotion level  PARTICIPATION LIMITATIONS: meal prep, cleaning, laundry,  driving, shopping, community activity, and church  PERSONAL FACTORS: 1-2 comorbidities: HTN, prior CVA are also affecting patient's functional outcome.   REHAB POTENTIAL: Good  CLINICAL DECISION MAKING: Stable/uncomplicated  EVALUATION COMPLEXITY: Low   GOALS: Goals reviewed with patient? Yes  SHORT TERM GOALS: Target date: 08/14/2024   The patient will demonstrate knowledge of basic self care strategies and exercises to promote healing  Baseline: Goal status: met 10/16 2.   Improved knee ROM 5-105 for greater ease getting in/out of the car Baseline:  Goal status: met 10/30  3.  Able to rise from a standard chair with min use of UEs Baseline:  Goal status: met 10/16  4.  Improved gait speed with patient able to walk 850 feet in 6 minutes Baseline:  Goal status: INITIAL     LONG TERM GOALS: Target date: 09/11/2024   The patient will be independent in a safe self progression of a home exercise program to promote further recovery of function  Baseline:  Goal status: INITIAL  2.  The patient will report a 75% improvement in pain levels with functional activities which are currently difficult including going to church, shopping, turning over in bed Baseline:  Goal status: INITIAL  3.  The patient will have improved LE strength of at least 4+/5 needed to ascend and descend steps reciprocally  Baseline:  Goal status: INITIAL  4.  Improved ROM 3-115 degrees needed for  stairs, getting in/out of the car and for a normalized gait pattern Baseline:  Goal status: INITIAL  5.  Lower Extremity Functional Scale (LEFS) improved to    55   /80 indicating improved function with less pain Baseline:  Goal status: INITIAL    PLAN:  PT FREQUENCY: 2x/week  PT DURATION: 8 weeks  PLANNED INTERVENTIONS: 97164- PT Re-evaluation, 97110-Therapeutic exercises, 97530- Therapeutic activity, 97112- Neuromuscular re-education, 97535- Self Care, [id number]- Manual therapy, [phone]- Aquatic Therapy, [id number]- Electrical stimulation (unattended), [phone]- Electrical stimulation (manual), 97016- Vasopneumatic device, Patient/Family education, Balance training, Taping, Joint mobilization, Scar mobilization, Cryotherapy, and Moist heat  PLAN FOR NEXT SESSION: do 6 min walk test for STG;  bike; knee flexion and extension ROM; quad strengthening; manual therapy including scar massage and patellar mobilization; vasocompression as needed for edema control; progress HEP     Glade Pesa, PT 08/28/24 9:25 PM Phone: [phone] Fax: [phone]  [facility] [address], [address] [place], [place] [address] Phone # [phone] Fax [phone]

## 2024-09-01 ENCOUNTER — Encounter: Payer: Self-pay | Admitting: Rehabilitative and Restorative Service Providers"

## 2024-09-01 ENCOUNTER — Ambulatory Visit: Attending: Physician Assistant | Admitting: Rehabilitative and Restorative Service Providers"

## 2024-09-01 ENCOUNTER — Encounter: Payer: Self-pay | Admitting: Radiology

## 2024-09-01 DIAGNOSIS — M6281 Muscle weakness (generalized): Secondary | ICD-10-CM | POA: Insufficient documentation

## 2024-09-01 DIAGNOSIS — M25562 Pain in left knee: Secondary | ICD-10-CM | POA: Diagnosis present

## 2024-09-01 DIAGNOSIS — R262 Difficulty in walking, not elsewhere classified: Secondary | ICD-10-CM | POA: Insufficient documentation

## 2024-09-01 DIAGNOSIS — M25662 Stiffness of left knee, not elsewhere classified: Secondary | ICD-10-CM | POA: Diagnosis present

## 2024-09-01 NOTE — Therapy (Signed)
 OUTPATIENT PHYSICAL THERAPY TREATMENT NOTE   Patient Name: Alison Ward MRN: [id number] DOB:1961/09/19, 63 y.o., female Today's Date: 09/01/2024  Progress Note Reporting Period 07/17/2024 to 09/01/2024  See note below for Objective Data and Assessment of Progress/Goals.       END OF SESSION:  PT End of Session - 09/01/24 1238     Visit Number 7    Date for Recertification  09/11/24    Authorization Type Medicare/ Medicaid    Progress Note Due on Visit 17    PT Start Time 1235    PT Stop Time 1315    PT Time Calculation (min) 40 min    Activity Tolerance Patient tolerated treatment well    Behavior During Therapy WFL for tasks assessed/performed          Past Medical History:  Diagnosis Date   Arthritis    Chronic headaches    daily, bitemporal, associated with tingling in face   Heart murmur    Hypertension    Mitral regurgitation    Pre-diabetes    Seizures (HCC)    partial seizures last one on 10/22/15  On Keppra  currently - last seizure 2021   Stroke (HCC) 07/05/2013   Tremor    in hands, more in right   Past Surgical History:  Procedure Laterality Date   ABDOMINAL HYSTERECTOMY N/A 12/15/2015   Procedure: HYSTERECTOMY ABDOMINAL;  Surgeon: Harland JAYSON Birkenhead, MD;  Location: [facility];  Service: Gynecology;  Laterality: N/A;   CESAREAN SECTION     COLONOSCOPY     POLYPECTOMY     SALPINGOOPHORECTOMY Bilateral 12/15/2015   Procedure: SALPINGO OOPHORECTOMY;  Surgeon: Harland JAYSON Birkenhead, MD;  Location: [facility];  Service: Gynecology;  Laterality: Bilateral;   TOTAL KNEE ARTHROPLASTY Left 05/26/2024   Procedure: LEFT TOTAL KNEE ARTHROPLASTY;  Surgeon: Jerri Kay HERO, MD;  Location: [facility];  Service: Orthopedics;  Laterality: Left;   TRANSESOPHAGEAL ECHOCARDIOGRAM (CATH LAB) N/A 08/05/2024   Procedure: TRANSESOPHAGEAL ECHOCARDIOGRAM;  Surgeon: Kate Lonni CROME, MD;  Location: [facility] IN[facility]ASIVE CV LAB;  Service: Cardiovascular;  Laterality: N/A;   VENTRICULOSTOMY Left  07/07/2013   Procedure: VENTRICULOSTOMY;  Surgeon: Reyes JONETTA Budge, MD;  Location: [facility];  Service: Neurosurgery;  Laterality: Left;  Left Ventriculostomy and Removal of Right Vetriculostomy   Patient Active Problem List   Diagnosis Date Noted   Severe mitral regurgitation 08/05/2024   Status post total left knee replacement 05/26/2024   Acute pain of right knee 05/23/2023   Murmur, cardiac 01/24/2023   Primary osteoarthritis of left knee 05/19/2022   Lumbar radiculopathy 03/11/2018   Ganglion cyst of right foot 07/04/2017   Chronic tension-type headache, not intractable 12/30/2015   Post-operative state 12/15/2015   Simple partial seizure disorder (HCC) 11/04/2015   Left sided numbness 09/25/2015   Leukopenia 09/25/2015   Tension vascular headache 09/07/2015   Meralgia paresthetica of left side 09/07/2015   Fainting spell 09/07/2015   Prediabetes 09/02/2015   Stye 08/06/2014   Essential hypertension, benign 04/16/2014   CVA (cerebral vascular accident) (HCC) 04/16/2014   Headache 04/16/2014   Tension headache 03/06/2014   Obesity, morbid (HCC) 08/13/2013   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   ICH (intracerebral hemorrhage) (HCC) 07/21/2013   Hypokalemia 07/21/2013   Intracerebral hemorrhage (HCC) 07/06/2013   Essential hypertension 02/24/2009    PCP: Delbert Clam MD  REFERRING PROVIDER: Jule Ronal CROME PA-C  REFERRING DIAG: [phone] s/p total left knee replacement  THERAPY DIAG:  Left knee pain;  weakness; left knee stiffness  Rationale for Evaluation and Treatment: Rehabilitation  ONSET DATE: > 1 year; surgery 7/28  SUBJECTIVE:   SUBJECTIVE STATEMENT: Patient continues to deny pain, states that it is easier to get in and out of the car than it was.   PERTINENT HISTORY: 05/26/24 left TKR Has a leaky valve and having a cardiac procedure to have a better view Oct 7; doing OT for hand tremors since increased since surgery History of  CVA, hemorrhagic brain infarction; HTN  PAIN:   Are you having pain? No NPRS scale: 0/10 Pain location: Left knee  Pain orientation: Right and Left  PAIN TYPE: aching Pain description: intermittent  Aggravating factors: certain movements like getting in/out of the car Relieving factors: ice   PRECAUTIONS: None    WEIGHT BEARING RESTRICTIONS: No  FALLS:  Has patient fallen in last 6 months? No  LIVING ENVIRONMENT: Lives in: House/apartment Stairs: Yes: External: 15 steps; on right going up doing one step at a time   OCCUPATION: retired   PLOF: Independent  PATIENT GOALS: knee will be moving as it shoulder normally so not relying on right leg; turning over in bed easier while sleeping  NEXT MD VISIT: Post-Op in November  OBJECTIVE:  Note: Objective measures were completed at Evaluation unless otherwise noted.  DIAGNOSTIC FINDINGS: left knee OA  PATIENT SURVEYS:  LEFS  Extreme difficulty/unable (0), Quite a bit of difficulty (1), Moderate difficulty (2), Little difficulty (3), No difficulty (4) Survey date:    9/18 09/01/24  Any of your usual work, housework or school activities 2 4  2. Usual hobbies, recreational or sporting activities 2 4  3. Getting into/out of the bath 2 4  4. Walking between rooms 4 4  5. Putting on socks/shoes 2 4  6. Squatting  2 4  7. Lifting an object, like a bag of groceries from the floor 4 4  8. Performing light activities around your home 4 4  9. Performing heavy activities around your home 3 4  10. Getting into/out of a car 2 4  11. Walking 2 blocks 3 4  12. Walking 1 mile 1 2  13. Going up/down 10 stairs (1 flight) 2 4  14. Standing for 1 hour 3 4  15.  sitting for 1 hour 3 4  16. Running on even ground 1 3  17. Running on uneven ground 1 2  18. Making sharp turns while running fast 1 2  19. Hopping  1 2  20. Rolling over in bed 2 4  Score total:  45/80 = 53.25% 71/80 = 88.75%     COGNITION: Overall cognitive status:  Within functional limits for tasks assessed     EDEMA:  Moderate peri-patellar edema  PALPATION: Decreased scar mobility; decreased patellar mobility  LOWER EXTREMITY ROM:  Active ROM Right eval Left eval Left 08/11/24 Left 10/16 10/23 Left supine 10/30  Hip flexion full full      Hip extension        Hip abduction        Hip adduction        Hip internal rotation        Hip external rotation        Knee flexion 120 93 sit/95 supine  98 100 109 114  Knee extension 0 7 sit and supine  5 4 3 3   Ankle dorsiflexion        Ankle plantarflexion        Ankle inversion  Ankle eversion         (Blank rows = not tested)  LOWER EXTREMITY MMT: + quad lag with SLR  MMT Right eval Left eval 10/30  Hip flexion 4 4 5   Hip extension     Hip abduction 4 4 4+  Hip adduction     Hip internal rotation     Hip external rotation     Knee flexion 5 4- 4  Knee extension 5 3+ 4-  Ankle dorsiflexion     Ankle plantarflexion 4 4 5   Ankle inversion     Ankle eversion      (Blank rows = not tested)   FUNCTIONAL TESTS:  Eval: Moderate use of bil Ues to rise from a standard chair 3 minute walk test: 383 feet no device  07/28/2024: Timed up and Go (TUG):  15.02 sec 5 times sit to/from stand:  19.76 sec with decreased weight through LLE  09/01/2024: 6 min walk test:  897 ft without pain  GAIT:  Comments: no device but decreased stance time on left; lacks full knee extension; decreased speed; difficulty walking with her purse on one side                                                                                                                                TREATMENT DATE:   09/01/2024: Nustep level 5 x4 min with PT present to discuss status 6 min walk test:  897 ft without pain Seated LAQ with 5# ankle weights 2x10 bilat Seated with 5# ankle weights hip flex/abduction to lift over a flat cone and back 2x10 bilat Standing knee flexion with left LE on 2nd step 5x5 sec  hold FWD step ups with 6 step with unilateral UE support x10 bilat Seated hamstring curls with green tband 2x10 bilat Leg Press (seat at 8) 80# 2x10 bilat Lower Extremity Functional Scale = 71/80 = 88.75%   08/28/2024: Bike rocking and then full revolutions 5 minutes Manual therapy: seated knee flexion with manual overpressure 10x, supine knee flexion with overpressure 20x LAQ 5# 10x 2 Seated green band HS curls 10x 6 inch step ups 10x single arm support Attempted 4 inch step up and overs 2x with turn around although discontinued due to dizziness  4 inch step downs 10x Resisted backward walk 10# with belt 10x  Leg press seat 8 75#  10x 2 bil; single leg 35# left only 10x 2verbal cues for terminal knee extension    08/21/2024: Nustep level 5 x5 min Les only with PT present to discuss status Bike rocking and then full revolutions 3 minutes Manual therapy: seated knee flexion with manual overpressure 10x, supine knee flexion with overpressure 20x LAQ 4# 15x Sitting on blue ball rocking forward and back and bouncing for knee flexion  6 inch step ups 10x single arm support 4 inch lateral step ups 10x  4 inch step downs 10x 2nd step knee flexion 1  minute 2nd step HS stretch 1 minute WB on left with right step taps 10x no UE support Leg press seat 8 70#  15x bil; single leg 35# left only 10x verbal cues for terminal knee extension     PATIENT EDUCATION:  Education details: Educated patient on anatomy and physiology of current symptoms, prognosis, plan of care as well as initial self care strategies to promote recovery Person educated: Patient Education method: Explanation Education comprehension: verbalized understanding  HOME EXERCISE PROGRAM: Access Code: [email or web address] URL: [email or web address] Date: 08/11/2024 Prepared by: Jarrell Germain Koopmann  Exercises - Supine Heel Slide with Strap  - 5 x daily - 7 x weekly - 1 sets - 10 reps - Supine Knee Extension Strengthening   - 1 x daily - 7 x weekly - 1 sets - 10 reps - Active Straight Leg Raise with Quad Set (Mirrored)  - 1 x daily - 7 x weekly - 2 sets - 10 reps - Seated Knee Flexion Stretch (Mirrored)  - 5 x daily - 7 x weekly - 1 sets - 10 reps - Seated Long Arc Quad  - 1 x daily - 7 x weekly - 2 sets - 10 reps - Standing March with Unilateral Counter Support  - 1 x daily - 7 x weekly - 2 sets - 10 reps - Standing Hip Abduction with Counter Support  - 1 x daily - 7 x weekly - 2 sets - 10 reps - Standing Knee Flexion AROM with Chair Support  - 1 x daily - 7 x weekly - 2 sets - 10 reps - Standing Knee Flexion Stretch on Step  - 5 x daily - 7 x weekly - 1 sets - 10 reps - Seated Quad Set  - 1 x daily - 7 x weekly - 2 sets - 10 reps - Staggered Sit-to-Stand  - 1 x daily - 7 x weekly - 2 sets - 10 reps - Seated Hamstring Stretch  - 1 x daily - 7 x weekly - 2 reps - 20 sec hold  ASSESSMENT:  CLINICAL IMPRESSION:  Ms Palla presents to skilled PT reporting that she is feeling better and able to navigate stairs with reciprocal pattern and get in and out of the car easier.  Patient with improved distance on 6 minute walk test and has met all short term goals.  Patient with greatly improved score on Lower Extremity Functional Scale today.  Patient able to progress with weight on leg press today.  Patient tolerated all strengthening well and without any loss of balance noted during session.  Patient is progressing appropriately towards all long term goals and is improving with her knee ROM.  Patient continues to require skilled PT to progress towards goal related activities.     OBJECTIVE IMPAIRMENTS: decreased activity tolerance, decreased mobility, difficulty walking, decreased ROM, decreased strength, increased edema, impaired perceived functional ability, and pain.   ACTIVITY LIMITATIONS: standing, squatting, sleeping, stairs, bed mobility, and locomotion level  PARTICIPATION LIMITATIONS: meal prep, cleaning,  laundry, driving, shopping, community activity, and church  PERSONAL FACTORS: 1-2 comorbidities: HTN, prior CVA are also affecting patient's functional outcome.   REHAB POTENTIAL: Good  CLINICAL DECISION MAKING: Stable/uncomplicated  EVALUATION COMPLEXITY: Low   GOALS: Goals reviewed with patient? Yes  SHORT TERM GOALS: Target date: 08/14/2024   The patient will demonstrate knowledge of basic self care strategies and exercises to promote healing  Baseline: Goal status: met 10/16  2.   Improved knee ROM 5-105 for greater ease getting  in/out of the car Baseline:  Goal status: met 10/30  3.  Able to rise from a standard chair with min use of UEs Baseline:  Goal status: met 10/16  4.  Improved gait speed with patient able to walk 850 feet in 6 minutes Baseline:  Goal status: Met on 09/01/2024     LONG TERM GOALS: Target date: 09/11/2024   The patient will be independent in a safe self progression of a home exercise program to promote further recovery of function  Baseline:  Goal status: Ongoing  2.  The patient will report a 75% improvement in pain levels with functional activities which are currently difficult including going to church, shopping, turning over in bed Baseline:  Goal status: Ongoing  3.  The patient will have improved LE strength of at least 4+/5 needed to ascend and descend steps reciprocally  Baseline:  Goal status: Ongoing  4.  Improved ROM 3-115 degrees needed for stairs, getting in/out of the car and for a normalized gait pattern Baseline:  Goal status: Ongoing (see above)  5.  Lower Extremity Functional Scale (LEFS) improved to    55   /80 indicating improved function with less pain Baseline:  Goal status: Met on 09/01/2024    PLAN:  PT FREQUENCY: 2x/week  PT DURATION: 8 weeks  PLANNED INTERVENTIONS: 97164- PT Re-evaluation, 97110-Therapeutic exercises, 97530- Therapeutic activity, 97112- Neuromuscular re-education, 97535- Self Care,  97140- Manual therapy, [phone]- Aquatic Therapy, G0283- Electrical stimulation (unattended), [phone]- Electrical stimulation (manual), 02983- Vasopneumatic device, Patient/Family education, Balance training, Taping, Joint mobilization, Scar mobilization, Cryotherapy, and Moist heat  PLAN FOR NEXT SESSION: bike; knee flexion and extension ROM; quad strengthening; manual therapy including scar massage and patellar mobilization; vasocompression as needed for edema control; progress HEP     Jarrell Laming, PT, DPT 09/01/24, 1:26 PM  [facility] [address] [place], [place] [address] Phone # [phone] Fax [phone]

## 2024-09-02 ENCOUNTER — Ambulatory Visit: Payer: Self-pay | Admitting: Occupational Therapy

## 2024-09-04 ENCOUNTER — Ambulatory Visit: Admitting: Physical Therapy

## 2024-09-08 ENCOUNTER — Ambulatory Visit: Admitting: Rehabilitative and Restorative Service Providers"

## 2024-09-11 ENCOUNTER — Ambulatory Visit: Admitting: Physical Therapy

## 2024-09-12 ENCOUNTER — Encounter: Payer: Self-pay | Admitting: Physical Therapy

## 2024-09-12 ENCOUNTER — Ambulatory Visit: Admitting: Physical Therapy

## 2024-09-12 DIAGNOSIS — M25662 Stiffness of left knee, not elsewhere classified: Secondary | ICD-10-CM

## 2024-09-12 DIAGNOSIS — M6281 Muscle weakness (generalized): Secondary | ICD-10-CM

## 2024-09-12 DIAGNOSIS — M25562 Pain in left knee: Secondary | ICD-10-CM

## 2024-09-12 DIAGNOSIS — R262 Difficulty in walking, not elsewhere classified: Secondary | ICD-10-CM

## 2024-09-12 NOTE — Therapy (Signed)
 OUTPATIENT PHYSICAL THERAPY TREATMENT NOTE/RECERTIFICATION/DISCHARGE SUMMARY   Patient Name: Alison Ward MRN: [id number] DOB:[birth year], 63 y.o., female Today's Date: 09/12/2024     END OF SESSION:  PT End of Session - 09/12/24 1101     Visit Number 8    Date for Recertification  09/12/24    Authorization Type Medicare/ Medicaid    Progress Note Due on Visit 17    PT Start Time 1055    PT Stop Time 1133    PT Time Calculation (min) 38 min    Activity Tolerance Patient tolerated treatment well          Past Medical History:  Diagnosis Date   Arthritis    Chronic headaches    daily, bitemporal, associated with tingling in face   Heart murmur    Hypertension    Mitral regurgitation    Pre-diabetes    Seizures (HCC)    partial seizures last one on 10/22/15  On Keppra  currently - last seizure 2021   Stroke (HCC) 07/05/2013   Tremor    in hands, more in right   Past Surgical History:  Procedure Laterality Date   ABDOMINAL HYSTERECTOMY N/A 12/15/2015   Procedure: HYSTERECTOMY ABDOMINAL;  Surgeon: Harland JAYSON Birkenhead, MD;  Location: [facility];  Service: Gynecology;  Laterality: N/A;   CESAREAN SECTION     COLONOSCOPY     POLYPECTOMY     SALPINGOOPHORECTOMY Bilateral 12/15/2015   Procedure: SALPINGO OOPHORECTOMY;  Surgeon: Harland JAYSON Birkenhead, MD;  Location: [facility];  Service: Gynecology;  Laterality: Bilateral;   TOTAL KNEE ARTHROPLASTY Left 05/26/2024   Procedure: LEFT TOTAL KNEE ARTHROPLASTY;  Surgeon: Jerri Kay HERO, MD;  Location: [facility];  Service: Orthopedics;  Laterality: Left;   TRANSESOPHAGEAL ECHOCARDIOGRAM (CATH LAB) N/A 08/05/2024   Procedure: TRANSESOPHAGEAL ECHOCARDIOGRAM;  Surgeon: Kate Lonni CROME, MD;  Location: [facility] INVASIVE CV LAB;  Service: Cardiovascular;  Laterality: N/A;   VENTRICULOSTOMY Left 07/07/2013   Procedure: VENTRICULOSTOMY;  Surgeon: Reyes JONETTA Budge, MD;  Location: [facility];  Service: Neurosurgery;  Laterality: Left;  Left Ventriculostomy  and Removal of Right Vetriculostomy   Patient Active Problem List   Diagnosis Date Noted   Severe mitral regurgitation 08/05/2024   Status post total left knee replacement 05/26/2024   Acute pain of right knee 05/23/2023   Murmur, cardiac 01/24/2023   Primary osteoarthritis of left knee 05/19/2022   Lumbar radiculopathy 03/11/2018   Ganglion cyst of right foot 07/04/2017   Chronic tension-type headache, not intractable 12/30/2015   Post-operative state 12/15/2015   Simple partial seizure disorder (HCC) 11/04/2015   Left sided numbness 09/25/2015   Leukopenia 09/25/2015   Tension vascular headache 09/07/2015   Meralgia paresthetica of left side 09/07/2015   Fainting spell 09/07/2015   Prediabetes 09/02/2015   Stye 08/06/2014   Essential hypertension, benign 04/16/2014   CVA (cerebral vascular accident) (HCC) 04/16/2014   Headache 04/16/2014   Tension headache 03/06/2014   Obesity, morbid (HCC) 08/13/2013   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  It doesn't hurt.  I feel ready for discharge.  PERTINENT HISTORY: 05/26/24 left TKR Has a leaky valve and having a cardiac procedure to have a better view Oct 7; doing OT for hand tremors since increased since surgery History of CVA, hemorrhagic brain infarction; HTN  PAIN:   Are you having pain? No NPRS scale: 0/10 Pain location: Left knee  Pain orientation: Right and Left  PAIN TYPE: aching Pain  description: intermittent  Aggravating factors: certain movements like getting in/out of the car Relieving factors: ice   PRECAUTIONS: None    WEIGHT BEARING RESTRICTIONS: No  FALLS:  Has patient fallen in last 6 months? No  LIVING ENVIRONMENT: Lives in: House/apartment Stairs: Yes: External: 15 steps; on right going up doing one step at a time   OCCUPATION: retired   PLOF: Independent  PATIENT GOALS: knee will be moving as it shoulder normally so not relying on right leg; turning over in bed easier while sleeping  NEXT MD VISIT: Post-Op in November  OBJECTIVE:  Note: Objective measures were completed at Evaluation unless otherwise noted.  DIAGNOSTIC FINDINGS: left knee OA  PATIENT SURVEYS:  LEFS  Extreme difficulty/unable (0), Quite a bit of difficulty (1), Moderate difficulty (2), Little difficulty (3), No difficulty (4) Survey date:    9/18 09/01/24  Any of your usual work, housework or school activities 2 4  2. Usual hobbies, recreational or sporting activities 2 4  3. Getting into/out of the bath 2 4  4. Walking between rooms 4 4  5. Putting on socks/shoes 2 4  6. Squatting  2 4  7. Lifting an object, like a bag of groceries from the floor 4 4  8. Performing light activities around your home 4 4  9. Performing heavy activities around your home 3 4  10. Getting into/out of a car 2 4  11. Walking 2 blocks 3 4  12. Walking 1 mile 1 2  13. Going up/down 10 stairs (1 flight) 2 4  14. Standing for 1 hour 3 4  15.  sitting for 1 hour 3 4  16. Running on even ground 1 3  17. Running on uneven ground 1 2  18. Making sharp turns while running fast 1 2  19. Hopping  1 2  20. Rolling over in bed 2 4  Score total:  45/80 = 53.25% 71/80 = 88.75%     COGNITION: Overall cognitive status: Within functional limits for tasks assessed     EDEMA:  Moderate peri-patellar edema  PALPATION: Decreased scar mobility; decreased patellar mobility  LOWER EXTREMITY  ROM:  Active ROM Right eval Left eval Left 08/11/24 Left 10/16 10/23 Left supine 10/30 11/14  Hip flexion full full       Hip extension         Hip abduction         Hip adduction         Hip internal rotation         Hip external rotation         Knee flexion 120 93 sit/95 supine  98 100 109 114 120 supine  Knee extension 0 7 sit and supine  5 4 3 3  0 seated and supine  Ankle dorsiflexion         Ankle plantarflexion         Ankle inversion         Ankle eversion          (Blank rows =  not tested)  LOWER EXTREMITY MMT: + quad lag with SLR  MMT Right eval Left eval 10/30 11/14  Hip flexion 4 4 5 5   Hip extension      Hip abduction 4 4 4+ 5  Hip adduction      Hip internal rotation      Hip external rotation      Knee flexion 5 4- 4 4+  Knee extension 5 3+ 4- 4+  Ankle dorsiflexion      Ankle plantarflexion 4 4 5 5   Ankle inversion      Ankle eversion       (Blank rows = not tested)   FUNCTIONAL TESTS:  Eval: Moderate use of bil Ues to rise from a standard chair 3 minute walk test: 383 feet no device  07/28/2024: Timed up and Go (TUG):  15.02 sec 5 times sit to/from stand:  19.76 sec with decreased weight through LLE  09/01/2024: 6 min walk test:  897 ft without pain  11/14: 5x STS no hands 22.12 TUG: 12.61 6 MWT 978 feet   GAIT:  Comments: no device but decreased stance time on left; lacks full knee extension; decreased speed; difficulty walking with her purse on one side                                                                                                                                TREATMENT DATE:  09/12/24: Bike 3 min full revolutions Up and down steps reciprocally with single arm support going up and double light support to descend 5x STS  TUG 6 MWT: discussion of increasing speed by 10% Knee ROM Review of progress toward goals and expectations for further improvement in knee flexion  09/01/2024: Nustep level 5 x4 min with PT  present to discuss status 6 min walk test:  897 ft without pain Seated LAQ with 5# ankle weights 2x10 bilat Seated with 5# ankle weights hip flex/abduction to lift over a flat cone and back 2x10 bilat Standing knee flexion with left LE on 2nd step 5x5 sec hold FWD step ups with 6 step with unilateral UE support x10 bilat Seated hamstring curls with green tband 2x10 bilat Leg Press (seat at 8) 80# 2x10 bilat Lower Extremity Functional Scale = 71/80 = 88.75%   08/28/2024: Bike rocking and then full revolutions 5 minutes Manual therapy: seated knee flexion with manual overpressure 10x, supine knee flexion with overpressure 20x LAQ 5# 10x 2 Seated green band HS curls 10x 6 inch step ups 10x single arm support Attempted 4 inch step up and overs 2x with turn around although discontinued due to dizziness  4 inch step downs 10x Resisted backward walk 10# with belt 10x  Leg press seat 8 75#  10x 2 bil; single leg 35# left only 10x 2verbal cues for terminal knee extension    08/21/2024: Nustep level 5 x5 min Les only with PT present to discuss status Bike  rocking and then full revolutions 3 minutes Manual therapy: seated knee flexion with manual overpressure 10x, supine knee flexion with overpressure 20x LAQ 4# 15x Sitting on blue ball rocking forward and back and bouncing for knee flexion  6 inch step ups 10x single arm support 4 inch lateral step ups 10x  4 inch step downs 10x 2nd step knee flexion 1 minute 2nd step HS stretch 1 minute WB on left with right step taps 10x no UE support Leg press seat 8 70#  15x bil; single leg 35# left only 10x verbal cues for terminal knee extension     PATIENT EDUCATION:  Education details: Educated patient on anatomy and physiology of current symptoms, prognosis, plan of care as well as initial self care strategies to promote recovery Person educated: Patient Education method: Explanation Education comprehension: verbalized understanding  HOME  EXERCISE PROGRAM: Access Code: [email or web address] URL: [email or web address] Date: 08/11/2024 Prepared by: Jarrell Menke  Exercises - Supine Heel Slide with Strap  - 5 x daily - 7 x weekly - 1 sets - 10 reps - Supine Knee Extension Strengthening  - 1 x daily - 7 x weekly - 1 sets - 10 reps - Active Straight Leg Raise with Quad Set (Mirrored)  - 1 x daily - 7 x weekly - 2 sets - 10 reps - Seated Knee Flexion Stretch (Mirrored)  - 5 x daily - 7 x weekly - 1 sets - 10 reps - Seated Long Arc Quad  - 1 x daily - 7 x weekly - 2 sets - 10 reps - Standing March with Unilateral Counter Support  - 1 x daily - 7 x weekly - 2 sets - 10 reps - Standing Hip Abduction with Counter Support  - 1 x daily - 7 x weekly - 2 sets - 10 reps - Standing Knee Flexion AROM with Chair Support  - 1 x daily - 7 x weekly - 2 sets - 10 reps - Standing Knee Flexion Stretch on Step  - 5 x daily - 7 x weekly - 1 sets - 10 reps - Seated Quad Set  - 1 x daily - 7 x weekly - 2 sets - 10 reps - Staggered Sit-to-Stand  - 1 x daily - 7 x weekly - 2 sets - 10 reps - Seated Hamstring Stretch  - 1 x daily - 7 x weekly - 2 reps - 20 sec hold  ASSESSMENT:  CLINICAL IMPRESSION:  The patient has met the majority of rehab goals, with noted improvements in pain reduction, outcome score, ROM, strength and functional mobility.  A comprehensive HEP has been established and anticipate further improvements over time with regular performance of the program.  Recommend discharge from PT at this time.     OBJECTIVE IMPAIRMENTS: decreased activity tolerance, decreased mobility, difficulty walking, decreased ROM, decreased strength, increased edema, impaired perceived functional ability, and pain.   ACTIVITY LIMITATIONS: standing, squatting, sleeping, stairs, bed mobility, and locomotion level  PARTICIPATION LIMITATIONS: meal prep, cleaning, laundry, driving, shopping, community activity, and church  PERSONAL FACTORS: 1-2 comorbidities:  HTN, prior CVA are also affecting patient's functional outcome.   REHAB POTENTIAL: Good  CLINICAL DECISION MAKING: Stable/uncomplicated  EVALUATION COMPLEXITY: Low   GOALS: Goals reviewed with patient? Yes  SHORT TERM GOALS: Target date: 08/14/2024   The patient will demonstrate knowledge of basic self care strategies and exercises to promote healing  Baseline: Goal status: met 10/16  2.   Improved knee ROM 5-105 for greater ease getting in/out  of the car Baseline:  Goal status: met 10/30  3.  Able to rise from a standard chair with min use of UEs Baseline:  Goal status: met 10/16  4.  Improved gait speed with patient able to walk 850 feet in 6 minutes Baseline:  Goal status: Met on 09/01/2024     LONG TERM GOALS: Target date: 09/11/2024   The patient will be independent in a safe self progression of a home exercise program to promote further recovery of function  Baseline:  Goal status: met 11/14  2.  The patient will report a 75% improvement in pain levels with functional activities which are currently difficult including going to church, shopping, turning over in bed Baseline:  Goal status: met 100%  3.  The patient will have improved LE strength of at least 4+/5 needed to ascend and descend steps reciprocally  Baseline:  Goal status: met 11/14  4.  Improved ROM 3-115 degrees needed for stairs, getting in/out of the car and for a normalized gait pattern Baseline:  Goal status: met 11/14 5.  Lower Extremity Functional Scale (LEFS) improved to    55   /80 indicating improved function with less pain Baseline:  Goal status: Met on 09/01/2024    PLAN:   PHYSICAL THERAPY DISCHARGE SUMMARY  Visits from Start of Care: 8  Current functional level related to goals / functional outcomes: See clinical impressions above   Remaining deficits: As above   Education / Equipment: HEP   Patient agrees to discharge. Patient goals were met. Patient is being  discharged due to meeting the stated rehab goals.   Glade Pesa, PT 09/12/24 11:50 AM Phone: [phone] Fax: [phone]   [facility] [address], [address] [place], [place] [address] Phone # [phone] Fax [phone]

## 2024-10-08 ENCOUNTER — Encounter (HOSPITAL_BASED_OUTPATIENT_CLINIC_OR_DEPARTMENT_OTHER): Payer: Self-pay | Admitting: Cardiology

## 2024-10-09 ENCOUNTER — Other Ambulatory Visit

## 2024-10-16 ENCOUNTER — Ambulatory Visit (HOSPITAL_BASED_OUTPATIENT_CLINIC_OR_DEPARTMENT_OTHER): Admission: RE | Admit: 2024-10-16 | Source: Ambulatory Visit

## 2024-10-16 DIAGNOSIS — E2839 Other primary ovarian failure: Secondary | ICD-10-CM | POA: Insufficient documentation

## 2024-10-17 ENCOUNTER — Ambulatory Visit: Payer: Self-pay | Admitting: Family Medicine

## 2024-10-31 ENCOUNTER — Ambulatory Visit

## 2024-10-31 DIAGNOSIS — Z23 Encounter for immunization: Secondary | ICD-10-CM

## 2024-10-31 NOTE — Progress Notes (Signed)
Flu vaccine administered per protocols.  Information sheet given. Patient denies and pain or discomfort at injection site. Tolerated injection well no reaction.  

## 2024-11-04 ENCOUNTER — Other Ambulatory Visit: Payer: Self-pay | Admitting: Family Medicine

## 2024-11-04 DIAGNOSIS — E7800 Pure hypercholesterolemia, unspecified: Secondary | ICD-10-CM

## 2024-11-04 NOTE — Telephone Encounter (Unsigned)
 Copied from CRM [id number]. Topic: Clinical - Medication Refill >> Nov 04, 2024  8:32 AM Kevelyn M wrote: Medication: atorvastatin  (LIPITOR) 40 MG tablet, it says one refill but pharmacy will not refill  Has the patient contacted their pharmacy? Yes (Agent: If no, request that the patient contact the pharmacy for the refill. If patient does not wish to contact the pharmacy document the reason why and proceed with request.) (Agent: If yes, when and what did the pharmacy advise?)  This is the patient's preferred pharmacy:  [id number] [address] [facility] - [address] [place] [address] Phone: [phone] Fax: [phone]  Is this the correct pharmacy for this prescription? Yes If no, delete pharmacy and type the correct one.   Has the prescription been filled recently? No  Is the patient out of the medication? No, 1 tablet  Has the patient been seen for an appointment in the last year OR does the patient have an upcoming appointment? Yes  Can we respond through MyChart? Yes  Agent: Please be advised that Rx refills may take up to 3 business days. We ask that you follow-up with your pharmacy.

## 2024-11-05 MED ORDER — ATORVASTATIN CALCIUM 40 MG PO TABS: 40.0000 mg | ORAL_TABLET | Freq: Every day | ORAL | 1 refills | Status: AC

## 2024-11-05 NOTE — Telephone Encounter (Signed)
 Requested Prescriptions  Pending Prescriptions Disp Refills   atorvastatin  (LIPITOR) 40 MG tablet 90 tablet 1    Sig: Take 1 tablet (40 mg total) by mouth daily.     Cardiovascular:  Antilipid - Statins Failed - 11/05/2024  3:21 PM      Failed - Lipid Panel in normal range within the last 12 months    Cholesterol, Total  Date Value Ref Range Status  03/13/2023 147 100 - 199 mg/dL Final   LDL Chol Calc (NIH)  Date Value Ref Range Status  03/13/2023 81 0 - 99 mg/dL Final   HDL  Date Value Ref Range Status  03/13/2023 43 >39 mg/dL Final   Triglycerides  Date Value Ref Range Status  03/13/2023 131 0 - 149 mg/dL Final         Passed - Patient is not pregnant      Passed - Valid encounter within last 12 months    Recent Outpatient Visits           3 months ago Prediabetes   [facility] [place] Delbert Clam, MD   4 months ago Other fatigue   [facility] [place] Delbert Clam, MD   8 months ago Left arm pain   [facility] [place]. [facility] Delbert Clam, MD   9 months ago Prediabetes   [facility] [place]. [facility] Delbert Clam, MD   1 year ago Prediabetes   [facility] [place]. [facility] Delbert Clam, MD       Future Appointments             In 1 week Jule Ronal CROME, PA-C [facility]

## 2024-11-12 ENCOUNTER — Ambulatory Visit: Admitting: Physician Assistant

## 2024-11-13 ENCOUNTER — Other Ambulatory Visit: Payer: Self-pay | Admitting: Family Medicine

## 2024-11-13 DIAGNOSIS — G5712 Meralgia paresthetica, left lower limb: Secondary | ICD-10-CM

## 2025-01-23 ENCOUNTER — Institutional Professional Consult (permissible substitution): Admitting: Internal Medicine

## 2025-01-23 ENCOUNTER — Other Ambulatory Visit (HOSPITAL_COMMUNITY)

## 2025-02-03 ENCOUNTER — Ambulatory Visit: Payer: Self-pay | Admitting: Family Medicine

## 2025-02-06 ENCOUNTER — Other Ambulatory Visit (HOSPITAL_COMMUNITY)
